# Patient Record
Sex: Female | Born: 1940 | Race: White | Hispanic: No | Smoking: Former smoker
Health system: Southern US, Community
[De-identification: ages and names within clinical notes are randomized; demographics above are authoritative.]

## PROBLEM LIST (undated history)

## (undated) DIAGNOSIS — I10 Essential (primary) hypertension: Secondary | ICD-10-CM

## (undated) DIAGNOSIS — C3490 Malignant neoplasm of unspecified part of unspecified bronchus or lung: Secondary | ICD-10-CM

## (undated) DIAGNOSIS — H2012 Chronic iridocyclitis, left eye: Secondary | ICD-10-CM

## (undated) DIAGNOSIS — K219 Gastro-esophageal reflux disease without esophagitis: Secondary | ICD-10-CM

## (undated) DIAGNOSIS — Z460 Encounter for fitting and adjustment of spectacles and contact lenses: Secondary | ICD-10-CM

## (undated) DIAGNOSIS — F329 Major depressive disorder, single episode, unspecified: Secondary | ICD-10-CM

## (undated) DIAGNOSIS — R112 Nausea with vomiting, unspecified: Secondary | ICD-10-CM

## (undated) DIAGNOSIS — M1990 Unspecified osteoarthritis, unspecified site: Secondary | ICD-10-CM

## (undated) DIAGNOSIS — Z9889 Other specified postprocedural states: Secondary | ICD-10-CM

## (undated) DIAGNOSIS — N952 Postmenopausal atrophic vaginitis: Secondary | ICD-10-CM

## (undated) DIAGNOSIS — N200 Calculus of kidney: Secondary | ICD-10-CM

## (undated) DIAGNOSIS — Z9109 Other allergy status, other than to drugs and biological substances: Secondary | ICD-10-CM

## (undated) DIAGNOSIS — F418 Other specified anxiety disorders: Secondary | ICD-10-CM

## (undated) DIAGNOSIS — F32A Depression, unspecified: Secondary | ICD-10-CM

## (undated) DIAGNOSIS — K635 Polyp of colon: Secondary | ICD-10-CM

## (undated) DIAGNOSIS — M8580 Other specified disorders of bone density and structure, unspecified site: Secondary | ICD-10-CM

## (undated) DIAGNOSIS — N1832 Chronic kidney disease, stage 3b: Secondary | ICD-10-CM

## (undated) DIAGNOSIS — J45909 Unspecified asthma, uncomplicated: Secondary | ICD-10-CM

## (undated) DIAGNOSIS — F419 Anxiety disorder, unspecified: Secondary | ICD-10-CM

## (undated) DIAGNOSIS — E781 Pure hyperglyceridemia: Secondary | ICD-10-CM

## (undated) HISTORY — DX: Pure hyperglyceridemia: E78.1

## (undated) HISTORY — PX: APPENDECTOMY: SHX54

## (undated) HISTORY — DX: Polyp of colon: K63.5

## (undated) HISTORY — DX: Malignant neoplasm of unspecified part of unspecified bronchus or lung: C34.90

## (undated) HISTORY — PX: EYE SURGERY: SHX253

## (undated) HISTORY — DX: Other specified disorders of bone density and structure, unspecified site: M85.80

## (undated) HISTORY — DX: Gastro-esophageal reflux disease without esophagitis: K21.9

## (undated) HISTORY — DX: Postmenopausal atrophic vaginitis: N95.2

## (undated) HISTORY — DX: Other specified anxiety disorders: F41.8

## (undated) HISTORY — DX: Unspecified osteoarthritis, unspecified site: M19.90

## (undated) HISTORY — PX: PARTIAL HYSTERECTOMY: SHX80

## (undated) HISTORY — DX: Calculus of kidney: N20.0

## (undated) HISTORY — PX: COLONOSCOPY: SHX174

---

## 1999-10-30 ENCOUNTER — Encounter: Payer: Self-pay | Admitting: Orthopedic Surgery

## 1999-10-30 ENCOUNTER — Encounter: Admission: RE | Admit: 1999-10-30 | Discharge: 1999-10-30 | Payer: Self-pay | Admitting: Orthopedic Surgery

## 2000-02-09 ENCOUNTER — Encounter: Admission: RE | Admit: 2000-02-09 | Discharge: 2000-02-09 | Payer: Self-pay | Admitting: Family Medicine

## 2000-02-09 ENCOUNTER — Encounter: Payer: Self-pay | Admitting: Family Medicine

## 2000-08-13 ENCOUNTER — Encounter: Admission: RE | Admit: 2000-08-13 | Discharge: 2000-08-13 | Payer: Self-pay | Admitting: Family Medicine

## 2000-08-13 ENCOUNTER — Encounter: Payer: Self-pay | Admitting: Family Medicine

## 2000-11-04 ENCOUNTER — Encounter (INDEPENDENT_AMBULATORY_CARE_PROVIDER_SITE_OTHER): Payer: Self-pay | Admitting: Specialist

## 2000-11-04 ENCOUNTER — Ambulatory Visit (HOSPITAL_COMMUNITY): Admission: RE | Admit: 2000-11-04 | Discharge: 2000-11-04 | Payer: Self-pay | Admitting: Internal Medicine

## 2001-03-30 ENCOUNTER — Encounter: Admission: RE | Admit: 2001-03-30 | Discharge: 2001-03-30 | Payer: Self-pay | Admitting: Family Medicine

## 2001-03-30 ENCOUNTER — Encounter: Payer: Self-pay | Admitting: Family Medicine

## 2002-07-03 ENCOUNTER — Encounter: Admission: RE | Admit: 2002-07-03 | Discharge: 2002-07-03 | Payer: Self-pay | Admitting: Family Medicine

## 2002-07-03 ENCOUNTER — Encounter: Payer: Self-pay | Admitting: Family Medicine

## 2003-07-13 ENCOUNTER — Encounter: Admission: RE | Admit: 2003-07-13 | Discharge: 2003-07-13 | Payer: Self-pay | Admitting: Family Medicine

## 2004-07-31 ENCOUNTER — Encounter: Admission: RE | Admit: 2004-07-31 | Discharge: 2004-07-31 | Payer: Self-pay | Admitting: Family Medicine

## 2005-08-18 ENCOUNTER — Encounter: Admission: RE | Admit: 2005-08-18 | Discharge: 2005-08-18 | Payer: Self-pay | Admitting: Family Medicine

## 2005-09-02 ENCOUNTER — Encounter: Admission: RE | Admit: 2005-09-02 | Discharge: 2005-09-02 | Payer: Self-pay | Admitting: Family Medicine

## 2006-08-31 ENCOUNTER — Encounter: Admission: RE | Admit: 2006-08-31 | Discharge: 2006-08-31 | Payer: Self-pay | Admitting: Family Medicine

## 2006-09-06 ENCOUNTER — Ambulatory Visit: Payer: Self-pay | Admitting: Internal Medicine

## 2006-10-27 ENCOUNTER — Encounter: Payer: Self-pay | Admitting: Internal Medicine

## 2006-10-27 ENCOUNTER — Ambulatory Visit: Payer: Self-pay | Admitting: Internal Medicine

## 2007-11-22 ENCOUNTER — Encounter: Admission: RE | Admit: 2007-11-22 | Discharge: 2007-11-22 | Payer: Self-pay | Admitting: Family Medicine

## 2008-03-21 ENCOUNTER — Encounter: Admission: RE | Admit: 2008-03-21 | Discharge: 2008-03-21 | Payer: Self-pay | Admitting: Family Medicine

## 2008-12-10 ENCOUNTER — Encounter: Admission: RE | Admit: 2008-12-10 | Discharge: 2008-12-10 | Payer: Self-pay | Admitting: Family Medicine

## 2009-12-18 ENCOUNTER — Encounter: Admission: RE | Admit: 2009-12-18 | Discharge: 2009-12-18 | Payer: Self-pay | Admitting: Family Medicine

## 2010-12-30 ENCOUNTER — Other Ambulatory Visit: Payer: Self-pay | Admitting: Family Medicine

## 2010-12-30 DIAGNOSIS — Z1231 Encounter for screening mammogram for malignant neoplasm of breast: Secondary | ICD-10-CM

## 2011-01-14 ENCOUNTER — Ambulatory Visit: Payer: Self-pay

## 2011-01-14 ENCOUNTER — Ambulatory Visit
Admission: RE | Admit: 2011-01-14 | Discharge: 2011-01-14 | Disposition: A | Discharge status: Home / Self Care | Payer: Medicare Other | Source: Ambulatory Visit | Attending: Family Medicine | Admitting: Family Medicine

## 2011-01-14 DIAGNOSIS — Z1231 Encounter for screening mammogram for malignant neoplasm of breast: Secondary | ICD-10-CM

## 2011-05-12 ENCOUNTER — Other Ambulatory Visit: Payer: Self-pay | Admitting: Family Medicine

## 2011-05-12 DIAGNOSIS — M899 Disorder of bone, unspecified: Secondary | ICD-10-CM

## 2011-05-12 DIAGNOSIS — M949 Disorder of cartilage, unspecified: Secondary | ICD-10-CM

## 2011-05-19 ENCOUNTER — Ambulatory Visit
Admission: RE | Admit: 2011-05-19 | Discharge: 2011-05-19 | Disposition: A | Discharge status: Home / Self Care | Payer: Medicare Other | Source: Ambulatory Visit | Attending: Family Medicine | Admitting: Family Medicine

## 2011-05-19 DIAGNOSIS — M949 Disorder of cartilage, unspecified: Secondary | ICD-10-CM

## 2011-05-19 DIAGNOSIS — M899 Disorder of bone, unspecified: Secondary | ICD-10-CM

## 2011-12-01 ENCOUNTER — Other Ambulatory Visit: Payer: Self-pay | Admitting: Physician Assistant

## 2012-01-04 ENCOUNTER — Other Ambulatory Visit: Payer: Self-pay | Admitting: Family Medicine

## 2012-01-04 DIAGNOSIS — Z1231 Encounter for screening mammogram for malignant neoplasm of breast: Secondary | ICD-10-CM

## 2012-01-20 ENCOUNTER — Ambulatory Visit
Admission: RE | Admit: 2012-01-20 | Discharge: 2012-01-20 | Disposition: A | Discharge status: Home / Self Care | Payer: Medicare Other | Source: Ambulatory Visit | Attending: Family Medicine | Admitting: Family Medicine

## 2012-01-20 DIAGNOSIS — Z1231 Encounter for screening mammogram for malignant neoplasm of breast: Secondary | ICD-10-CM

## 2012-12-02 ENCOUNTER — Other Ambulatory Visit: Payer: Self-pay | Admitting: Physician Assistant

## 2012-12-20 ENCOUNTER — Ambulatory Visit: Payer: Medicare Other | Attending: Orthopedic Surgery | Admitting: Physical Therapy

## 2012-12-20 DIAGNOSIS — M25619 Stiffness of unspecified shoulder, not elsewhere classified: Secondary | ICD-10-CM | POA: Insufficient documentation

## 2012-12-20 DIAGNOSIS — M6281 Muscle weakness (generalized): Secondary | ICD-10-CM | POA: Insufficient documentation

## 2012-12-20 DIAGNOSIS — IMO0001 Reserved for inherently not codable concepts without codable children: Secondary | ICD-10-CM | POA: Insufficient documentation

## 2012-12-20 DIAGNOSIS — M25559 Pain in unspecified hip: Secondary | ICD-10-CM | POA: Insufficient documentation

## 2012-12-20 DIAGNOSIS — M25519 Pain in unspecified shoulder: Secondary | ICD-10-CM | POA: Insufficient documentation

## 2012-12-22 ENCOUNTER — Ambulatory Visit: Payer: Medicare Other | Admitting: Physical Therapy

## 2012-12-23 ENCOUNTER — Ambulatory Visit: Payer: Medicare Other | Admitting: Physical Therapy

## 2012-12-27 ENCOUNTER — Ambulatory Visit: Payer: Medicare Other | Admitting: Physical Therapy

## 2012-12-29 ENCOUNTER — Ambulatory Visit: Payer: Medicare Other | Admitting: Physical Therapy

## 2013-01-03 ENCOUNTER — Ambulatory Visit: Payer: Medicare Other | Attending: Orthopedic Surgery | Admitting: Physical Therapy

## 2013-01-03 DIAGNOSIS — IMO0001 Reserved for inherently not codable concepts without codable children: Secondary | ICD-10-CM | POA: Insufficient documentation

## 2013-01-03 DIAGNOSIS — M25559 Pain in unspecified hip: Secondary | ICD-10-CM | POA: Insufficient documentation

## 2013-01-03 DIAGNOSIS — M25619 Stiffness of unspecified shoulder, not elsewhere classified: Secondary | ICD-10-CM | POA: Insufficient documentation

## 2013-01-03 DIAGNOSIS — M25519 Pain in unspecified shoulder: Secondary | ICD-10-CM | POA: Insufficient documentation

## 2013-01-03 DIAGNOSIS — M6281 Muscle weakness (generalized): Secondary | ICD-10-CM | POA: Insufficient documentation

## 2013-01-05 ENCOUNTER — Ambulatory Visit: Payer: Medicare Other | Admitting: Physical Therapy

## 2013-01-09 ENCOUNTER — Ambulatory Visit: Payer: Medicare Other | Admitting: Physical Therapy

## 2013-01-24 ENCOUNTER — Ambulatory Visit: Payer: Medicare Other | Admitting: Physical Therapy

## 2013-01-26 ENCOUNTER — Ambulatory Visit: Payer: Medicare Other | Admitting: Physical Therapy

## 2013-01-30 ENCOUNTER — Ambulatory Visit: Payer: Medicare Other | Admitting: Physical Therapy

## 2013-01-31 ENCOUNTER — Other Ambulatory Visit: Payer: Self-pay

## 2013-01-31 DIAGNOSIS — Z1231 Encounter for screening mammogram for malignant neoplasm of breast: Secondary | ICD-10-CM

## 2013-02-01 ENCOUNTER — Ambulatory Visit: Payer: Medicare Other | Admitting: Physical Therapy

## 2013-02-02 ENCOUNTER — Ambulatory Visit: Payer: Medicare Other | Admitting: Physical Therapy

## 2013-02-08 ENCOUNTER — Other Ambulatory Visit: Payer: Self-pay

## 2013-02-09 ENCOUNTER — Ambulatory Visit: Payer: Medicare Other | Attending: Orthopedic Surgery | Admitting: Physical Therapy

## 2013-02-09 DIAGNOSIS — IMO0001 Reserved for inherently not codable concepts without codable children: Secondary | ICD-10-CM | POA: Insufficient documentation

## 2013-02-09 DIAGNOSIS — M25619 Stiffness of unspecified shoulder, not elsewhere classified: Secondary | ICD-10-CM | POA: Insufficient documentation

## 2013-02-09 DIAGNOSIS — M25519 Pain in unspecified shoulder: Secondary | ICD-10-CM | POA: Insufficient documentation

## 2013-02-09 DIAGNOSIS — M6281 Muscle weakness (generalized): Secondary | ICD-10-CM | POA: Insufficient documentation

## 2013-02-09 DIAGNOSIS — M25559 Pain in unspecified hip: Secondary | ICD-10-CM | POA: Insufficient documentation

## 2013-02-16 ENCOUNTER — Ambulatory Visit: Payer: Medicare Other | Admitting: Physical Therapy

## 2013-02-21 ENCOUNTER — Ambulatory Visit: Payer: Medicare Other | Admitting: Physical Therapy

## 2013-02-22 ENCOUNTER — Ambulatory Visit
Admission: RE | Admit: 2013-02-22 | Discharge: 2013-02-22 | Disposition: A | Discharge status: Home / Self Care | Payer: Medicare Other | Source: Ambulatory Visit

## 2013-02-22 DIAGNOSIS — Z1231 Encounter for screening mammogram for malignant neoplasm of breast: Secondary | ICD-10-CM

## 2013-02-27 ENCOUNTER — Ambulatory Visit: Payer: Medicare Other | Admitting: Physical Therapy

## 2013-03-02 ENCOUNTER — Ambulatory Visit: Payer: Medicare Other | Admitting: Physical Therapy

## 2013-03-07 ENCOUNTER — Ambulatory Visit: Payer: Medicare Other | Admitting: Physical Therapy

## 2013-03-09 ENCOUNTER — Ambulatory Visit: Payer: Medicare Other | Admitting: Physical Therapy

## 2013-05-11 ENCOUNTER — Other Ambulatory Visit: Payer: Self-pay

## 2013-05-30 ENCOUNTER — Ambulatory Visit (INDEPENDENT_AMBULATORY_CARE_PROVIDER_SITE_OTHER): Payer: Medicare Other | Admitting: Internal Medicine

## 2013-05-30 ENCOUNTER — Encounter: Payer: Self-pay | Admitting: Internal Medicine

## 2013-05-30 VITALS — BP 124/70 | HR 70 | Temp 98.0°F | Ht 64.0 in | Wt 199.2 lb

## 2013-05-30 DIAGNOSIS — R059 Cough, unspecified: Secondary | ICD-10-CM

## 2013-05-30 DIAGNOSIS — R05 Cough: Secondary | ICD-10-CM

## 2013-05-30 MED ORDER — FAMOTIDINE 20 MG PO TABS: ORAL_TABLET | ORAL | Status: DC

## 2013-05-30 MED ORDER — PANTOPRAZOLE SODIUM 40 MG PO TBEC: 40.0000 mg | DELAYED_RELEASE_TABLET | Freq: Every day | ORAL | Status: DC

## 2013-05-30 MED ORDER — TRAMADOL HCL 50 MG PO TABS: ORAL_TABLET | ORAL | Status: DC

## 2013-05-30 MED ORDER — PREDNISONE (PAK) 10 MG PO TABS: ORAL_TABLET | ORAL | Status: DC

## 2013-05-30 NOTE — Progress Notes (Signed)
Subjective:    Patient ID: [name], female    DOB: 1941-02-05  MRN: [id number]  HPI  72 yowf quit smoking 1980 allergies itching and sneezing took shots much better and stopped shots and much better except some problem spring and fall and did ok until 40's then developed recurrent cough typically assoc with URI's and not with spring and fall  But much worse since  Early 2014 despite rx for  Asthma and gerd so referred 05/30/2013 to pulmonary clinic by Dr Wyline Beady   05/30/2013 1st [facility] office visit/ Tanor Glaspy cc daily cough x months which is so severe gets gagged and occ vomit and pos urinary incontinent.  Clear mucus esp p shower, no better with inhalers and acid suppression to date     Kouffman Reflux v Neurogenic Cough Differentiator Reflux Comments  Do you awaken from a sound sleep coughing violently?                            With trouble breathing? Yes   Do you have choking episodes when you cannot  Get enough air, gasping for air ?              Yes   Do you usually cough when you lie down into  The bed, or when you just lie down to rest ?                          Yes   Do you usually cough after meals or eating?         sometime   Do you cough when (or after) you bend over?    Yes   GERD SCORE     Kouffman Reflux v Neurogenic Cough Differentiator Neurogenic   Do you more-or-less cough all day long? yes   Does change of temperature make you cough? yes   Does laughing or chuckling cause you to cough? yes   Do fumes (perfume, automobile fumes, burned  Toast, etc.,) cause you to cough ?      no   Does speaking, singing, or talking on the phone cause you to cough   ?               yes   Neurogenic/Airway score      No obvious day to day or daytime variabilty or assoc sob (unless coughing) or cp or chest tightness, subjective wheeze overt sinus  symptoms. No unusual exp hx or h/o childhood pna/ asthma or knowledge of premature birth.    Also denies any obvious  fluctuation of symptoms with weather or environmental changes or other aggravating or alleviating factors except as outlined above   Current Medications, Allergies, Complete Past Medical History, Past Surgical History, Family History, and Social History were reviewed in [employer] record.         Review of Systems  Constitutional: Negative for fever, chills and unexpected weight change.  HENT: Negative for congestion, dental problem, ear pain, nosebleeds, postnasal drip, rhinorrhea, sinus pressure, sneezing, sore throat, trouble swallowing and voice change.   Eyes: Negative for visual disturbance.  Respiratory: Positive for cough and shortness of breath. Negative for choking.   Cardiovascular: Positive for chest pain. Negative for leg swelling.  Gastrointestinal: Negative for vomiting, abdominal pain and diarrhea.  Genitourinary: Negative for difficulty urinating.       Acid Heartburn  Musculoskeletal: Positive for arthralgias.  Skin: Negative for rash.  Neurological: Positive for headaches. Negative for tremors and syncope.  Hematological: Does not bruise/bleed easily.       Objective:   Physical Exam  amb wf nad   Wt Readings from Last 3 Encounters:  05/30/13 199 lb 3.2 oz (90.357 kg)     HEENT: nl dentition, turbinates, and orophanx. Nl external ear canals without cough reflex   NECK :  without JVD/Nodes/TM/ nl carotid upstrokes bilaterally   LUNGS: no acc muscle use, clear to A and P bilaterally without cough on insp or exp maneuvers   CV:  RRR  no s3 or murmur or increase in P2, no edema   ABD:  soft and nontender with nl excursion in the supine position. No bruits or organomegaly, bowel sounds nl  MS:  warm without deformities, calf tenderness, cyanosis or clubbing  SKIN: warm and dry without lesions    NEURO:  alert, approp, no deficits    cxr 02/23/13 R midlung atx or infiltrate also ? Lingular atx        Assessment & Plan:

## 2013-05-30 NOTE — Patient Instructions (Signed)
The key to effective treatment for your cough is eliminating the non-stop cycle of cough you're stuck in long enough to let your airway heal completely and then see if there is anything still making you cough once you stop the cough suppression, but this should take no more than 5 days to figure out  First take delsym two tsp every 12 hours and supplement if needed with  tramadol 50 mg up to 2 every 4 hours to suppress the urge to cough at all or even clear your throat. Swallowing water or using ice chips/non mint and menthol containing candies (such as lifesavers or sugarless jolly ranchers) are also effective.  You should rest your voice and avoid activities that you know make you cough.  Once you have eliminated the cough for 3 straight days try reducing the tramadol first,  then the delsym as tolerated.    Try prilosec 20mg   Take x 2  30-60 min before first meal of the day and Pepcid 20 mg one bedtime   Prednisone 10 mg take  4 each am x 2 days,   2 each am x 2 days,  1 each am x 2 days and stop    GERD (REFLUX)  is an extremely common cause of respiratory symptoms, many times with no significant heartburn at all.    It can be treated with medication, but also with lifestyle changes including avoidance of late meals, excessive alcohol, smoking cessation, and avoid fatty foods, chocolate, peppermint, colas, red wine, and acidic juices such as orange juice.  NO MINT OR MENTHOL PRODUCTS SO NO COUGH DROPS  USE SUGARLESS CANDY INSTEAD (jolley ranchers or Stover's)  NO OIL BASED VITAMINS - use powdered substitutes.   Please schedule a follow up office visit in 4 weeks, sooner if needed

## 2013-05-31 DIAGNOSIS — J45991 Cough variant asthma: Secondary | ICD-10-CM

## 2013-05-31 HISTORY — DX: Cough variant asthma: J45.991

## 2013-05-31 NOTE — Assessment & Plan Note (Addendum)
The most common causes of chronic cough in immunocompetent adults include the following: upper airway cough syndrome (UACS), previously referred to as postnasal drip syndrome (PNDS), which is caused by variety of rhinosinus conditions; (2) asthma; (3) GERD; (4) chronic bronchitis from cigarette smoking or other inhaled environmental irritants; (5) nonasthmatic eosinophilic bronchitis; and (6) bronchiectasis.   These conditions, singly or in combination, have accounted for up to 94% of the causes of chronic cough in prospective studies.   Other conditions have constituted no >6% of the causes in prospective studies These have included bronchogenic carcinoma, chronic interstitial pneumonia, sarcoidosis, left ventricular failure, ACEI-induced cough, and aspiration from a condition associated with pharyngeal dysfunction.    Chronic cough is often simultaneously caused by more than one condition. A single cause has been found from 38 to 82% of the time, multiple causes from 18 to 62%. Multiply caused cough has been the result of three diseases up to 42% of the time.       Most likely this is  Classic Upper airway cough syndrome, so named because it's frequently impossible to sort out how much is  CR/sinusitis with freq throat clearing (which can be related to primary GERD)   vs  causing  secondary (" extra esophageal")  GERD from wide swings in gastric pressure that occur with throat clearing, often  promoting self use of mint and menthol lozenges that reduce the lower esophageal sphincter tone and exacerbate the problem further in a cyclical fashion.   These are the same pts (now being labeled as having "irritable larynx syndrome" by some cough centers) who not infrequently have a history of having failed to tolerate ace inhibitors,  dry powder inhalers or biphosphonates or report having atypical reflux symptoms that don't respond to standard doses of PPI , and are easily confused as having aecopd or asthma  flares by even experienced allergists/ pulmonologists.   Will try on cyclical cough protocol and regroup in 2 weeks

## 2013-06-12 ENCOUNTER — Telehealth: Payer: Self-pay | Admitting: Internal Medicine

## 2013-06-12 NOTE — Telephone Encounter (Signed)
Should be ok but take with bfast

## 2013-06-12 NOTE — Telephone Encounter (Signed)
lmomtcb x1 for pt 

## 2013-06-12 NOTE — Telephone Encounter (Signed)
i called and spoke with pt. She is aware of recs. Nothing further needed

## 2013-06-12 NOTE — Telephone Encounter (Signed)
Returning call can be reached at [phone].[name]

## 2013-06-12 NOTE — Telephone Encounter (Signed)
Pt is returning triages call & can be reached at [phone].  [name]

## 2013-06-12 NOTE — Telephone Encounter (Signed)
Called, spoke with pt.  Reports she was seen by PCP, Dr. Catha Gosselin, on Friday for CPX.  Per pt, her vit d was very low, and Dr. Clarene Duke started her on Vit d 50000 once weekly x 12 wks.  Pt would like MW to be aware of this given it is a capsule, and MW stopped her oil based vitamins.  Advised I would send msg to MW as Lorain Childes.  She verbalized understanding and voiced no further questions or concerns at this time.

## 2013-06-27 ENCOUNTER — Ambulatory Visit (INDEPENDENT_AMBULATORY_CARE_PROVIDER_SITE_OTHER): Payer: Medicare Other | Admitting: Internal Medicine

## 2013-06-27 ENCOUNTER — Ambulatory Visit (INDEPENDENT_AMBULATORY_CARE_PROVIDER_SITE_OTHER)
Admission: RE | Admit: 2013-06-27 | Discharge: 2013-06-27 | Disposition: A | Discharge status: Home / Self Care | Payer: Medicare Other | Source: Ambulatory Visit | Attending: Internal Medicine | Admitting: Internal Medicine

## 2013-06-27 ENCOUNTER — Encounter: Payer: Self-pay | Admitting: Internal Medicine

## 2013-06-27 VITALS — BP 138/82 | HR 65 | Temp 98.0°F | Ht 64.0 in | Wt 201.0 lb

## 2013-06-27 DIAGNOSIS — R9389 Abnormal findings on diagnostic imaging of other specified body structures: Secondary | ICD-10-CM

## 2013-06-27 DIAGNOSIS — R059 Cough, unspecified: Secondary | ICD-10-CM

## 2013-06-27 DIAGNOSIS — R05 Cough: Secondary | ICD-10-CM

## 2013-06-27 DIAGNOSIS — R918 Other nonspecific abnormal finding of lung field: Secondary | ICD-10-CM

## 2013-06-27 MED ORDER — METHYLPREDNISOLONE ACETATE 80 MG/ML IJ SUSP
120.0000 mg | Freq: Once | INTRAMUSCULAR | Status: AC
  Administered 2013-06-27: 120 mg via INTRAMUSCULAR

## 2013-06-27 MED ORDER — ACETAMINOPHEN-CODEINE #3 300-30 MG PO TABS: ORAL_TABLET | ORAL | Status: DC

## 2013-06-27 MED ORDER — FLUTTER DEVI: Status: DC

## 2013-06-27 NOTE — Progress Notes (Signed)
Subjective:    Patient ID: [name], female    DOB: Nov 06, 1940  MRN: [id number]   Brief patient profile:  72 yowf quit smoking 1980 allergies itching and sneezing took shots much better and stopped shots and much better except some problem spring and fall and did ok until 40's then developed recurrent cough typically assoc with URI's and not with spring and fall  But much worse since  Early 2014 despite rx for  Asthma and gerd so referred 05/30/2013 to pulmonary clinic by Dr Wyline Beady   05/30/2013 1st [facility] office visit/ Wert cc daily cough x months which is so severe gets gagged and occ vomit and pos urinary incontinent.  Clear mucus esp p shower, no better with inhalers and acid suppression to date  rec  First take delsym two tsp every 12 hours and supplement if needed with  tramadol 50 mg up to 2 every 4 hours to suppress the urge to cough at all or even clear your throat. Swallowing water or using ice chips/non mint and menthol containing candies (such as lifesavers or sugarless jolly ranchers) are also effective.  You should rest your voice and avoid activities that you know make you cough. Once you have eliminated the cough for 3 straight days try reducing the tramadol first,  then the delsym as tolerated.   Try prilosec 20mg   Take x 2  30-60 min before first meal of the day and Pepcid 20 mg one bedtime  Prednisone 10 mg take  4 each am x 2 days,   2 each am x 2 days,  1 each am x 2 days and stop   GERD diet    06/27/2013 f/u ov/Wert re: 0 % better chronic cough x Jan 2014 / using saba for cough Chief Complaint  Patient presents with  . Follow-up    Pt states her SOB and wheezing are unchanged. Her cough is slightly improved since last visit. No new co's.         Kouffman Reflux v Neurogenic Cough Differentiator Reflux Comments  Do you awaken from a sound sleep coughing violently?                            With trouble breathing? Yes   Do you have choking  episodes when you cannot  Get enough air, gasping for air ?              Yes   Do you usually cough when you lie down into  The bed, or when you just lie down to rest ?                          Recliner x 24m   Do you usually cough after meals or eating?         sometime   Do you cough when (or after) you bend over?    Yes   GERD SCORE     Kouffman Reflux v Neurogenic Cough Differentiator Neurogenic   Do you more-or-less cough all day long? yes   Does change of temperature make you cough? yes   Does laughing or chuckling cause you to cough? yes   Do fumes (perfume, automobile fumes, burned  Toast, etc.,) cause you to cough ?      no   Does speaking, singing, or talking on the phone cause you to cough   ?  yes   Neurogenic/Airway score                Objective:   Physical Exam  amb wf nad with harsh barking cough  . Wt Readings from Last 3 Encounters:  06/27/13 201 lb (91.173 kg)  05/30/13 199 lb 3.2 oz (90.357 kg)        HEENT: nl dentition, turbinates, and orophanx. Nl external ear canals without cough reflex   NECK :  without JVD/Nodes/TM/ nl carotid upstrokes bilaterally   LUNGS: no acc muscle use, clear to A and P bilaterally without cough on insp or exp maneuvers   CV:  RRR  no s3 or murmur or increase in P2, no edema   ABD:  soft and nontender with nl excursion in the supine position. No bruits or organomegaly, bowel sounds nl  MS:  warm without deformities, calf tenderness, cyanosis or clubbing  SKIN: warm and dry without lesions    NEURO:  alert, approp, no deficits    cxr 02/23/13 R midlung atx or infiltrate also ? Lingular atx  CXR  06/27/2013 : Poorly defined density in the region of the right middle lobe further evaluation with contrasted chest CT recommended. Atelectasis versus infiltrate in region of the lingula.         Assessment & Plan:

## 2013-06-27 NOTE — Patient Instructions (Addendum)
Please see patient coordinator before you leave today  to schedule sinus ct  Only use your albuterol/proaire as a rescue medication to be used if you can't catch your breath by resting or doing a relaxed purse lip breathing pattern.  - The less you use it, the better it will work when you need it. - Ok to use up to every 4 hours for breathing, not coughing - Don't leave home without it !!  (think of it like your spare tire for your car)   The key to effective treatment for your cough is eliminating the non-stop cycle of cough you're stuck in long enough to let your airway heal completely and then see if there is anything still making you cough once you stop the cough suppression, but this should take no more than 5 days to figure out  First take delsym two tsp every 12 hours and supplement if needed with  Tylenol #3 one every 4 hours to suppress the urge to cough at all or even clear your throat. Swallowing water or using ice chips/non mint and menthol containing candies (such as lifesavers or sugarless jolly ranchers) are also effective.  You should rest your voice and avoid activities that you know make you cough.  Once you have eliminated the cough for 3 straight days try reducing the tylenol #3 ,  then the delsym as tolerated.   Keep flutter valve handy and use it when you start cough   Omeprazole 40 mg  Take x 2  30-60 min before first meal of the day and Pepcid 20 mg one bedtime and chlotrimeton 4 mg one at bedtime  GERD (REFLUX)  is an extremely common cause of respiratory symptoms, many times with no significant heartburn at all.    It can be treated with medication, but also with lifestyle changes including avoidance of late meals, excessive alcohol, smoking cessation, and avoid fatty foods, chocolate, peppermint, colas (includes pepsi) , red wine, and acidic juices such as orange juice and limit caffeine. NO MINT OR MENTHOL PRODUCTS SO NO COUGH DROPS  USE SUGARLESS CANDY INSTEAD  (jolley ranchers or Stover's)  NO OIL BASED VITAMINS - use powdered substitutes.  Please remember to go to the x-ray department downstairs for your tests - we will call you with the results when they are available.      Please schedule a follow up office visit in 4 weeks, sooner if needed bring all medications with you when return.

## 2013-06-29 DIAGNOSIS — R918 Other nonspecific abnormal finding of lung field: Secondary | ICD-10-CM

## 2013-06-29 HISTORY — DX: Other nonspecific abnormal finding of lung field: R91.8

## 2013-06-29 NOTE — Assessment & Plan Note (Addendum)
-   Spirometry 02/23/13 wnl   Lack of cough resolution on a verified empirical regimen could mean an alternative diagnosis, persistence of the disease state (eg sinusitis or bronchiectasis) , or inadequacy of currently available therapy (eg no medical rx available for non-acid gerd)   rec eliminate cyclical coughing adding flutter valve to rx, pursue sinus CT and consider CT chest looking for bronchiectasis     Each maintenance medication was reviewed in detail including most importantly the difference between maintenance and as needed and under what circumstances the prns are to be used.  Please see instructions for details which were reviewed in writing and the patient given a copy.

## 2013-06-29 NOTE — Assessment & Plan Note (Signed)
Unlikely to be related to cough but could have underlying bronchiectasis > add CT chest at same time does CT sinus

## 2013-07-03 ENCOUNTER — Ambulatory Visit (INDEPENDENT_AMBULATORY_CARE_PROVIDER_SITE_OTHER)
Admission: RE | Admit: 2013-07-03 | Discharge: 2013-07-03 | Disposition: A | Discharge status: Home / Self Care | Payer: Medicare Other | Source: Ambulatory Visit | Attending: Internal Medicine | Admitting: Internal Medicine

## 2013-07-03 ENCOUNTER — Encounter: Payer: Self-pay | Admitting: Internal Medicine

## 2013-07-03 DIAGNOSIS — R059 Cough, unspecified: Secondary | ICD-10-CM

## 2013-07-03 DIAGNOSIS — R05 Cough: Secondary | ICD-10-CM

## 2013-07-11 ENCOUNTER — Telehealth: Payer: Self-pay | Admitting: *Deleted

## 2013-07-11 DIAGNOSIS — R05 Cough: Secondary | ICD-10-CM

## 2013-07-11 DIAGNOSIS — R059 Cough, unspecified: Secondary | ICD-10-CM

## 2013-07-11 NOTE — Telephone Encounter (Signed)
Message copied by Rosana Berger on Tue Jul 11, 2013  5:20 PM ------      Message from: Tanda Rockers      Created: Tue Jul 11, 2013  6:52 AM                   ----- Message -----         From: Tanda Rockers, MD         Sent: 07/03/2013   3:07 PM           To: Tanda Rockers, MD            Let her know sinus ct ok so next need to do CT chest no contrast eval cough  ------

## 2013-07-11 NOTE — Telephone Encounter (Signed)
Spoke with pt and notified of results per Dr. Melvyn Novas. Pt verbalized understanding and denied any questions. Order was sent to [facility]  Dr Melvyn Novas, pt has questions about her cxr  She reviewed her results on MyChart and is concered  Please advise thanks

## 2013-07-12 NOTE — Telephone Encounter (Signed)
Reviewed with pt, no change in recs

## 2013-07-13 ENCOUNTER — Other Ambulatory Visit (INDEPENDENT_AMBULATORY_CARE_PROVIDER_SITE_OTHER): Payer: Medicare Other

## 2013-07-13 DIAGNOSIS — R05 Cough: Secondary | ICD-10-CM

## 2013-07-13 DIAGNOSIS — R059 Cough, unspecified: Secondary | ICD-10-CM

## 2013-07-13 LAB — BASIC METABOLIC PANEL
BUN: 23 mg/dL (ref 6–23)
CO2: 28 mEq/L (ref 19–32)
Calcium: 10.2 mg/dL (ref 8.4–10.5)
Chloride: 104 mEq/L (ref 96–112)
Creatinine, Ser: 1.2 mg/dL (ref 0.4–1.2)
GFR: 49.16 mL/min — ABNORMAL LOW (ref >60.00)
Glucose, Bld: 95 mg/dL (ref 70–99)
Potassium: 4.5 mEq/L (ref 3.5–5.1)
Sodium: 139 mEq/L (ref 135–145)

## 2013-07-14 ENCOUNTER — Ambulatory Visit (INDEPENDENT_AMBULATORY_CARE_PROVIDER_SITE_OTHER)
Admission: RE | Admit: 2013-07-14 | Discharge: 2013-07-14 | Disposition: A | Discharge status: Home / Self Care | Payer: Medicare Other | Source: Ambulatory Visit | Attending: Internal Medicine | Admitting: Internal Medicine

## 2013-07-14 ENCOUNTER — Encounter: Payer: Self-pay | Admitting: Internal Medicine

## 2013-07-14 DIAGNOSIS — R059 Cough, unspecified: Secondary | ICD-10-CM

## 2013-07-14 DIAGNOSIS — R05 Cough: Secondary | ICD-10-CM

## 2013-07-14 MED ORDER — IOHEXOL 300 MG/ML  SOLN
80.0000 mL | Freq: Once | INTRAMUSCULAR | Status: AC | PRN
  Administered 2013-07-14: 80 mL via INTRAVENOUS

## 2013-07-25 ENCOUNTER — Ambulatory Visit (INDEPENDENT_AMBULATORY_CARE_PROVIDER_SITE_OTHER): Payer: Medicare Other | Admitting: Internal Medicine

## 2013-07-25 ENCOUNTER — Encounter: Payer: Self-pay | Admitting: Internal Medicine

## 2013-07-25 VITALS — BP 120/84 | HR 73 | Temp 97.6°F | Ht 64.0 in | Wt 197.0 lb

## 2013-07-25 DIAGNOSIS — R05 Cough: Secondary | ICD-10-CM

## 2013-07-25 DIAGNOSIS — R9389 Abnormal findings on diagnostic imaging of other specified body structures: Secondary | ICD-10-CM

## 2013-07-25 DIAGNOSIS — R059 Cough, unspecified: Secondary | ICD-10-CM

## 2013-07-25 DIAGNOSIS — R918 Other nonspecific abnormal finding of lung field: Secondary | ICD-10-CM

## 2013-07-25 MED ORDER — PREDNISONE 10 MG PO TABS: ORAL_TABLET | ORAL | Status: DC

## 2013-07-25 MED ORDER — METHYLPREDNISOLONE ACETATE 80 MG/ML IJ SUSP
120.0000 mg | Freq: Once | INTRAMUSCULAR | Status: AC
  Administered 2013-07-25: 120 mg via INTRAMUSCULAR

## 2013-07-25 MED ORDER — TRAMADOL HCL 50 MG PO TABS: ORAL_TABLET | ORAL | Status: DC

## 2013-07-25 NOTE — Progress Notes (Signed)
Subjective:    Patient ID: [name], female    DOB: 1940-12-30  MRN: [id number]   Brief patient profile:  48 yowf quit smoking 1980 allergies itching and sneezing took shots much improved when stopped shots and stayed  much better except some problem spring and fall and did ok until 40's then developed recurrent cough typically assoc with URI's and not with spring and fall  But much worse since  Early 2014 despite rx for  Asthma and gerd so referred 05/30/2013 to pulmonary clinic by Dr Lawernce Pitts  History of Present Illness  05/30/2013 1st [facility] office visit/ Wert cc daily cough x months which is so severe gets gagged and occ vomit and pos urinary incontinent.  Clear mucus esp p shower, no better with inhalers and acid suppression to date  rec  First take delsym two tsp every 12 hours and supplement if needed with  tramadol 50 mg up to 2 every 4 hours to suppress the urge to cough at all or even clear your throat. Swallowing water or using ice chips/non mint and menthol containing candies (such as lifesavers or sugarless jolly ranchers) are also effective.  You should rest your voice and avoid activities that you know make you cough. Once you have eliminated the cough for 3 straight days try reducing the tramadol first,  then the delsym as tolerated.   Try prilosec 20mg   Take x 2  30-60 min before first meal of the day and Pepcid 20 mg one bedtime  Prednisone 10 mg take  4 each am x 2 days,   2 each am x 2 days,  1 each am x 2 days and stop   GERD diet    06/27/2013 f/u ov/Wert re: 0 % better chronic cough x Jan 2014 / using saba for cough Chief Complaint  Patient presents with  . Follow-up    Pt states her SOB and wheezing are unchanged. Her cough is slightly improved since last visit. No new co's.    rec Please see patient coordinator before you leave today  to schedule sinus ct  Only use your albuterol/proaire as a rescue medication  First take delsym two tsp every 12  hours and supplement if needed with  Tylenol #3 one every 4 hours to suppress the urge to cough at all or even clear your throat.   Once you have eliminated the cough for 3 straight days try reducing the tylenol #3 ,  then the delsym as tolerated.  Keep flutter valve handy and use it when you start cough  Omeprazole 40 mg  Take x 2  30-60 min before first meal of the day and Pepcid 20 mg one bedtime and chlotrimeton 4 mg one at bedtime GERD diet   Please schedule a follow up office visit in 4 weeks, sooner if needed bring all medications with you when return.    07/25/2013 f/u ov/Wert re: cough x > one year / did not bring all meds "they are the same" Chief Complaint  Patient presents with  . Follow-up    Pt states her SOB and wheezing have improved some but her cough is some worse. Cough is prod with very minimal clear sputum.         Kouffman Reflux v Neurogenic Cough Differentiator Reflux Comments  Do you awaken from a sound sleep coughing violently?  With trouble breathing? Yes   Do you have choking episodes when you cannot  Get enough air, gasping for air ?              Yes   Do you usually cough when you lie down into  The bed, or when you just lie down to rest ?                          Recliner x 76m   Do you usually cough after meals or eating?         sometime   Do you cough when (or after) you bend over?    Yes   GERD SCORE     Kouffman Reflux v Neurogenic Cough Differentiator Neurogenic   Do you more-or-less cough all day long? yes   Does change of temperature make you cough? yes   Does laughing or chuckling cause you to cough? yes   Do fumes (perfume, automobile fumes, burned  Toast, etc.,) cause you to cough ?      no   Does speaking, singing, or talking on the phone cause you to cough   ?               yes   Neurogenic/Airway score          No obvious other patterns in  day to day or daytime variabilty or assoc chronic cough or cp or chest  tightness, subjective wheeze overt sinus or hb symptoms. No unusual exp hx or h/o childhood pna/ asthma or knowledge of premature birth.  Sleeping ok without nocturnal  or early am exacerbation  of respiratory  c/o's or need for noct saba. Also denies any obvious fluctuation of symptoms with weather or environmental changes or other aggravating or alleviating factors except as outlined above   Current Medications, Allergies, Complete Past Medical History, Past Surgical History, Family History, and Social History were reviewed in [employer] record.  ROS  The following are not active complaints unless bolded sore throat, dysphagia, dental problems, itching, sneezing,  nasal congestion or excess/ purulent secretions, ear ache,   fever, chills, sweats, unintended wt loss, pleuritic or exertional cp, hemoptysis,  orthopnea pnd or leg swelling, presyncope, palpitations, heartburn, abdominal pain, anorexia, nausea, vomiting, diarrhea  or change in bowel or urinary habits, change in stools or urine, dysuria,hematuria,  rash, arthralgias, visual complaints, headache, numbness weakness or ataxia or problems with walking or coordination,  change in mood/affect or memory.           Objective:   Physical Exam  amb wf nad with harsh barking cough  07/25/2013       197  Wt Readings from Last 3 Encounters:  06/27/13 201 lb (91.173 kg)  05/30/13 199 lb 3.2 oz (90.357 kg)        HEENT: nl dentition, turbinates, and orophanx. Nl external ear canals without cough reflex   NECK :  without JVD/Nodes/TM/ nl carotid upstrokes bilaterally   LUNGS: no acc muscle use, clear to A and P bilaterally without cough on insp or exp maneuvers   CV:  RRR  no s3 or murmur or increase in P2, no edema   ABD:  soft and nontender with nl excursion in the supine position. No bruits or organomegaly, bowel sounds nl  MS:  warm without deformities, calf tenderness, cyanosis or clubbing  SKIN: warm  and dry without lesions    NEURO:  alert, approp, no deficits    cxr 02/23/13 R midlung atx or infiltrate also ? Lingular atx  CXR  06/27/2013 : Poorly defined density in the region of the right middle lobe further evaluation with contrasted chest CT recommended. Atelectasis versus infiltrate in region of the lingula.  CT chest c contrast 07/11/13 1. No acute findings in the thorax to account for the patient's  symptoms.  2. However, there is a subsolid nodule in the anterior aspect of the  right lower lobe that has a ground-glass attenuation component  measuring 2.5 x 1.8 cm, and a small central solid component  measuring 4 mm. Initial follow-up by chest CT without contrast is  recommended in 3 months to confirm persistence.          Assessment & Plan:

## 2013-07-25 NOTE — Patient Instructions (Addendum)
Prednisone 10 mg take  4 each am x 2 days,   2 each am x 2 days,  1 each am x 2 days and stop  (called in)  The key to effective treatment for your cough is eliminating the non-stop cycle of cough you're stuck in long enough to let your airway heal completely and then see if there is anything still making you cough once you stop the cough suppression, but this should take no more than 5 days to figure out  First take delsym two tsp every 12 hours and supplement if needed with  Tylenol #3 one every 4 hours (or two tramadol) to suppress the urge to cough at all or even clear your throat. Swallowing water or using ice chips/non mint and menthol containing candies (such as lifesavers or sugarless jolly ranchers) are also effective.  You should rest your voice and avoid activities that you know make you cough.  Once you have eliminated the cough for 3 straight days try reducing the tylenol #3/tramadol first ,  then the delsym as tolerated.   Keep flutter valve handy and use it when you start cough   Omeprazole 40 mg  Take   30-60 min before first meal of the day and Pepcid 20 mg one bedtime and chlotrimeton 4 mg two  at bedtime  GERD (REFLUX)  is an extremely common cause of respiratory symptoms, many times with no significant heartburn at all.    It can be treated with medication, but also with lifestyle changes including avoidance of late meals, excessive alcohol, smoking cessation, and avoid fatty foods, chocolate, peppermint, colas (includes pepsi) , red wine, and acidic juices such as orange juice and limit caffeine. NO MINT OR MENTHOL PRODUCTS SO NO COUGH DROPS  USE SUGARLESS CANDY INSTEAD (jolley ranchers or Stover's)  NO OIL BASED VITAMINS - use powdered substitutes.  See Tammy NP w/in 2 weeks with all your medications, even over the counter meds, separated in two separate bags, the ones you take no matter what vs the ones you stop once you feel better and take only as needed when you feel you  need them.   Tammy  will generate for you a new user friendly medication calendar that will put [place] all on the same page re: your medication use.     Without this process, it simply isn't possible to assure that we are providing  your outpatient care  with  the attention to detail we feel you deserve.   If we cannot assure that you're getting that kind of care,  then we cannot manage your problem effectively from this clinic.  Once you have seen Tammy and we are sure that we're all on the same page with your medication use she will arrange follow up with me.  Late add:  When returns to see me I need the actual cxr's back 2 years if possible (not just the reports) Consider 2 week course of reglan add on to gerd rx then ov with me to check response

## 2013-07-27 NOTE — Assessment & Plan Note (Signed)
-   Spirometry 02/23/13 wnl  - 07/03/2013 Sinus CT > Mild chronic sinus disease as described. No acute findings. Left-to-right nasal septal deviation of 3 mm.  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   These are the same pts (now being labeled as having "irritable larynx syndrome" by some cough centers) who not infrequently have a history of having failed to tolerate ace inhibitors,  dry powder inhalers or biphosphonates or report having atypical reflux symptoms that don't respond to standard doses of PPI , and are easily confused as having aecopd or asthma flares by even experienced allergists/ pulmonologists.  For now focus again on eliminating cyclical cough and use a trust but verify approach re meds before adding more  See instructions for specific recommendations which were reviewed directly with the patient who was given a copy with highlighter outlining the key components.

## 2013-07-27 NOTE — Assessment & Plan Note (Signed)
-   see CXR 06/27/13  - CT chest 07/14/2013 > 1. No acute findings in the thorax to account for the patient's  symptoms.  2. However, there is a subsolid nodule in the anterior aspect of the  right lower lobe that has a ground-glass attenuation component  measuring 2.5 x 1.8 cm, and a small central solid component  measuring 4 mm. Initial follow-up by chest CT without contrast is  recommended in 3 months to confirm persistence. - in tickle file for recall 10/09/13    Doubt related to upper airway cough and may be viz on plain cxr's > need 2 year comparison and consider PET if definitely new lesion on plain cxr as she is a remote smoker and at least moderate risk

## 2013-08-01 ENCOUNTER — Encounter (HOSPITAL_BASED_OUTPATIENT_CLINIC_OR_DEPARTMENT_OTHER): Payer: Self-pay | Admitting: *Deleted

## 2013-08-01 NOTE — Progress Notes (Signed)
Pt will have neighbor bring-husband does not drive- To bring all meds and overnight bag-had im prednisone recently-sees dr wert.

## 2013-08-02 ENCOUNTER — Encounter (HOSPITAL_BASED_OUTPATIENT_CLINIC_OR_DEPARTMENT_OTHER): Payer: Self-pay | Admitting: Anesthesiology

## 2013-08-02 ENCOUNTER — Ambulatory Visit (HOSPITAL_BASED_OUTPATIENT_CLINIC_OR_DEPARTMENT_OTHER)
Admission: RE | Admit: 2013-08-02 | Discharge: 2013-08-02 | Disposition: A | Discharge status: Home / Self Care | Payer: Medicare Other | Source: Ambulatory Visit | Attending: Orthopedic Surgery | Admitting: Orthopedic Surgery

## 2013-08-02 ENCOUNTER — Encounter (HOSPITAL_BASED_OUTPATIENT_CLINIC_OR_DEPARTMENT_OTHER): Payer: Self-pay | Admitting: *Deleted

## 2013-08-02 ENCOUNTER — Encounter (HOSPITAL_BASED_OUTPATIENT_CLINIC_OR_DEPARTMENT_OTHER)
Admission: RE | Disposition: A | Discharge status: Home / Self Care | Payer: Self-pay | Source: Ambulatory Visit | Attending: Orthopedic Surgery

## 2013-08-02 DIAGNOSIS — Z01812 Encounter for preprocedural laboratory examination: Secondary | ICD-10-CM | POA: Insufficient documentation

## 2013-08-02 DIAGNOSIS — Z538 Procedure and treatment not carried out for other reasons: Secondary | ICD-10-CM | POA: Insufficient documentation

## 2013-08-02 HISTORY — DX: Unspecified asthma, uncomplicated: J45.909

## 2013-08-02 HISTORY — DX: Encounter for fitting and adjustment of spectacles and contact lenses: Z46.0

## 2013-08-02 LAB — POCT HEMOGLOBIN-HEMACUE: Hemoglobin: 12.5 g/dL (ref 12.0–15.0)

## 2013-08-02 SURGERY — CANCELLED PROCEDURE
Site: Wrist | Laterality: Left

## 2013-08-02 MED ORDER — LACTATED RINGERS IV SOLN
INTRAVENOUS | Status: DC
  Administered 2013-08-02: 13:00:00 via INTRAVENOUS

## 2013-08-02 MED ORDER — MIDAZOLAM HCL 2 MG/2ML IJ SOLN
INTRAMUSCULAR | Status: AC
  Filled 2013-08-02: qty 2

## 2013-08-02 MED ORDER — CEFAZOLIN SODIUM-DEXTROSE 2-3 GM-% IV SOLR
INTRAVENOUS | Status: AC
  Filled 2013-08-02: qty 50

## 2013-08-02 MED ORDER — CHLORHEXIDINE GLUCONATE 4 % EX LIQD: 60.0000 mL | Freq: Once | CUTANEOUS | Status: DC

## 2013-08-02 MED ORDER — MIDAZOLAM HCL 2 MG/2ML IJ SOLN: 1.0000 mg | INTRAMUSCULAR | Status: DC | PRN

## 2013-08-02 MED ORDER — FENTANYL CITRATE 0.05 MG/ML IJ SOLN
INTRAMUSCULAR | Status: AC
  Filled 2013-08-02: qty 2

## 2013-08-02 MED ORDER — FENTANYL CITRATE 0.05 MG/ML IJ SOLN: 50.0000 ug | INTRAMUSCULAR | Status: DC | PRN

## 2013-08-02 MED ORDER — CEFAZOLIN SODIUM-DEXTROSE 2-3 GM-% IV SOLR: 2.0000 g | INTRAVENOUS | Status: DC

## 2013-08-02 SURGICAL SUPPLY — 59 items
APL SKNCLS STERI-STRIP NONHPOA (GAUZE/BANDAGES/DRESSINGS)
BANDAGE ELASTIC 4 VELCRO ST LF (GAUZE/BANDAGES/DRESSINGS) IMPLANT
BENZOIN TINCTURE PRP APPL 2/3 (GAUZE/BANDAGES/DRESSINGS) IMPLANT
BLADE SURG 15 STRL LF DISP TIS (BLADE) IMPLANT
BLADE SURG 15 STRL SS (BLADE)
BNDG CMPR 9X4 STRL LF SNTH (GAUZE/BANDAGES/DRESSINGS)
BNDG COHESIVE 4X5 TAN STRL (GAUZE/BANDAGES/DRESSINGS) IMPLANT
BNDG CONFORM 3 STRL LF (GAUZE/BANDAGES/DRESSINGS) IMPLANT
BNDG ESMARK 4X9 LF (GAUZE/BANDAGES/DRESSINGS) IMPLANT
BNDG GAUZE ELAST 4 BULKY (GAUZE/BANDAGES/DRESSINGS) IMPLANT
CANISTER SUCT 1200ML W/VALVE (MISCELLANEOUS) IMPLANT
CORDS BIPOLAR (ELECTRODE) IMPLANT
COVER TABLE BACK 60X90 (DRAPES) IMPLANT
CUFF TOURNIQUET SINGLE 18IN (TOURNIQUET CUFF) IMPLANT
DECANTER SPIKE VIAL GLASS SM (MISCELLANEOUS) IMPLANT
DRAPE EXTREMITY T 121X128X90 (DRAPE) IMPLANT
DRAPE OEC MINIVIEW 54X84 (DRAPES) IMPLANT
ELECT NEEDLE TIP 2.8 STRL (NEEDLE) IMPLANT
ELECT REM PT RETURN 9FT ADLT (ELECTROSURGICAL)
ELECTRODE REM PT RTRN 9FT ADLT (ELECTROSURGICAL) IMPLANT
GLOVE BIOGEL PI IND STRL 8.5 (GLOVE) IMPLANT
GLOVE BIOGEL PI INDICATOR 8.5 (GLOVE)
GLOVE SURG ORTHO 8.0 STRL STRW (GLOVE) IMPLANT
GOWN STRL REUS W/ TWL XL LVL3 (GOWN DISPOSABLE) IMPLANT
GOWN STRL REUS W/TWL XL LVL3 (GOWN DISPOSABLE)
NEEDLE HYPO 25X1 1.5 SAFETY (NEEDLE) IMPLANT
NS IRRIG 1000ML POUR BTL (IV SOLUTION) IMPLANT
PACK BASIN DAY SURGERY FS (CUSTOM PROCEDURE TRAY) IMPLANT
PAD CAST 4YDX4 CTTN HI CHSV (CAST SUPPLIES) IMPLANT
PADDING CAST ABS 4INX4YD NS (CAST SUPPLIES)
PADDING CAST ABS COTTON 4X4 ST (CAST SUPPLIES) IMPLANT
PADDING CAST COTTON 4X4 STRL (CAST SUPPLIES)
PENCIL BUTTON HOLSTER BLD 10FT (ELECTRODE) IMPLANT
SLEEVE SCD COMPRESS KNEE MED (MISCELLANEOUS) IMPLANT
SLING ARM LRG ADULT FOAM STRAP (SOFTGOODS) IMPLANT
SLING ARM MED ADULT FOAM STRAP (SOFTGOODS) IMPLANT
SPLINT FIBERGLASS 3X35 (CAST SUPPLIES) IMPLANT
SPLINT FIBERGLASS 4X30 (CAST SUPPLIES) IMPLANT
SPONGE GAUZE 4X4 12PLY (GAUZE/BANDAGES/DRESSINGS) IMPLANT
STOCKINETTE 4X48 STRL (DRAPES) IMPLANT
STRIP CLOSURE SKIN 1/2X4 (GAUZE/BANDAGES/DRESSINGS) IMPLANT
SUCTION FRAZIER TIP 10 FR DISP (SUCTIONS) IMPLANT
SUT MNCRL AB 3-0 PS2 18 (SUTURE) IMPLANT
SUT MON AB 3-0 SH 27 (SUTURE)
SUT MON AB 3-0 SH27 (SUTURE) IMPLANT
SUT PROLENE 3 0 PS 1 (SUTURE) IMPLANT
SUT PROLENE 4 0 PS 2 18 (SUTURE) IMPLANT
SUT VIC AB 0 CT1 27 (SUTURE)
SUT VIC AB 0 CT1 27XBRD ANBCTR (SUTURE) IMPLANT
SUT VIC AB 2-0 PS2 27 (SUTURE) IMPLANT
SUT VIC AB 2-0 SH 27 (SUTURE)
SUT VIC AB 2-0 SH 27XBRD (SUTURE) IMPLANT
SUT VICRYL 4-0 PS2 18IN ABS (SUTURE) IMPLANT
SYR BULB 3OZ (MISCELLANEOUS) IMPLANT
SYR CONTROL 10ML LL (SYRINGE) IMPLANT
TOWEL OR 17X24 6PK STRL BLUE (TOWEL DISPOSABLE) IMPLANT
TRAY DSU PREP LF (CUSTOM PROCEDURE TRAY) IMPLANT
TUBE CONNECTING 20X1/4 (TUBING) IMPLANT
UNDERPAD 30X30 INCONTINENT (UNDERPADS AND DIAPERS) IMPLANT

## 2013-08-03 ENCOUNTER — Other Ambulatory Visit: Payer: Self-pay | Admitting: Orthopedic Surgery

## 2013-08-03 NOTE — Progress Notes (Signed)
Surgery r/s 1/28 to 1/30

## 2013-08-04 ENCOUNTER — Ambulatory Visit (HOSPITAL_BASED_OUTPATIENT_CLINIC_OR_DEPARTMENT_OTHER)
Admission: RE | Admit: 2013-08-04 | Discharge: 2013-08-04 | Disposition: A | Discharge status: Home / Self Care | Payer: Medicare Other | Source: Ambulatory Visit | Attending: Orthopedic Surgery | Admitting: Orthopedic Surgery

## 2013-08-04 ENCOUNTER — Encounter (HOSPITAL_BASED_OUTPATIENT_CLINIC_OR_DEPARTMENT_OTHER): Payer: Medicare Other | Admitting: Anesthesiology

## 2013-08-04 ENCOUNTER — Ambulatory Visit (HOSPITAL_BASED_OUTPATIENT_CLINIC_OR_DEPARTMENT_OTHER): Payer: Medicare Other | Admitting: Anesthesiology

## 2013-08-04 ENCOUNTER — Encounter (HOSPITAL_BASED_OUTPATIENT_CLINIC_OR_DEPARTMENT_OTHER)
Admission: RE | Disposition: A | Discharge status: Home / Self Care | Payer: Self-pay | Source: Ambulatory Visit | Attending: Orthopedic Surgery

## 2013-08-04 ENCOUNTER — Encounter (HOSPITAL_BASED_OUTPATIENT_CLINIC_OR_DEPARTMENT_OTHER): Payer: Self-pay | Admitting: Orthopedic Surgery

## 2013-08-04 DIAGNOSIS — F411 Generalized anxiety disorder: Secondary | ICD-10-CM | POA: Insufficient documentation

## 2013-08-04 DIAGNOSIS — M949 Disorder of cartilage, unspecified: Secondary | ICD-10-CM

## 2013-08-04 DIAGNOSIS — F329 Major depressive disorder, single episode, unspecified: Secondary | ICD-10-CM | POA: Insufficient documentation

## 2013-08-04 DIAGNOSIS — M1990 Unspecified osteoarthritis, unspecified site: Secondary | ICD-10-CM | POA: Insufficient documentation

## 2013-08-04 DIAGNOSIS — Y92009 Unspecified place in unspecified non-institutional (private) residence as the place of occurrence of the external cause: Secondary | ICD-10-CM | POA: Insufficient documentation

## 2013-08-04 DIAGNOSIS — D126 Benign neoplasm of colon, unspecified: Secondary | ICD-10-CM | POA: Insufficient documentation

## 2013-08-04 DIAGNOSIS — Z888 Allergy status to other drugs, medicaments and biological substances status: Secondary | ICD-10-CM | POA: Insufficient documentation

## 2013-08-04 DIAGNOSIS — K219 Gastro-esophageal reflux disease without esophagitis: Secondary | ICD-10-CM | POA: Insufficient documentation

## 2013-08-04 DIAGNOSIS — Z886 Allergy status to analgesic agent status: Secondary | ICD-10-CM | POA: Insufficient documentation

## 2013-08-04 DIAGNOSIS — S52599A Other fractures of lower end of unspecified radius, initial encounter for closed fracture: Secondary | ICD-10-CM | POA: Insufficient documentation

## 2013-08-04 DIAGNOSIS — F3289 Other specified depressive episodes: Secondary | ICD-10-CM | POA: Insufficient documentation

## 2013-08-04 DIAGNOSIS — M899 Disorder of bone, unspecified: Secondary | ICD-10-CM | POA: Insufficient documentation

## 2013-08-04 DIAGNOSIS — E781 Pure hyperglyceridemia: Secondary | ICD-10-CM | POA: Insufficient documentation

## 2013-08-04 DIAGNOSIS — Z87891 Personal history of nicotine dependence: Secondary | ICD-10-CM | POA: Insufficient documentation

## 2013-08-04 DIAGNOSIS — W19XXXA Unspecified fall, initial encounter: Secondary | ICD-10-CM | POA: Insufficient documentation

## 2013-08-04 DIAGNOSIS — J45909 Unspecified asthma, uncomplicated: Secondary | ICD-10-CM | POA: Insufficient documentation

## 2013-08-04 HISTORY — PX: ORIF WRIST FRACTURE: SHX2133

## 2013-08-04 SURGERY — OPEN REDUCTION INTERNAL FIXATION (ORIF) WRIST FRACTURE
Anesthesia: Regional | Site: Wrist | Laterality: Left

## 2013-08-04 MED ORDER — ONDANSETRON HCL 4 MG/2ML IJ SOLN
INTRAMUSCULAR | Status: DC | PRN
  Administered 2013-08-04: 4 mg via INTRAVENOUS

## 2013-08-04 MED ORDER — CHLORHEXIDINE GLUCONATE 4 % EX LIQD: 60.0000 mL | Freq: Once | CUTANEOUS | Status: DC

## 2013-08-04 MED ORDER — HYDROMORPHONE HCL PF 1 MG/ML IJ SOLN
0.2500 mg | INTRAMUSCULAR | Status: DC | PRN
  Administered 2013-08-04 (×4): 0.5 mg via INTRAVENOUS
  Filled 2013-08-04: qty 1

## 2013-08-04 MED ORDER — CEFAZOLIN SODIUM-DEXTROSE 2-3 GM-% IV SOLR
INTRAVENOUS | Status: AC
  Filled 2013-08-04: qty 50

## 2013-08-04 MED ORDER — HYDROMORPHONE HCL PF 1 MG/ML IJ SOLN
INTRAMUSCULAR | Status: AC
  Filled 2013-08-04: qty 1

## 2013-08-04 MED ORDER — OXYCODONE HCL 5 MG/5ML PO SOLN: 5.0000 mg | Freq: Once | ORAL | Status: DC | PRN

## 2013-08-04 MED ORDER — FENTANYL CITRATE 0.05 MG/ML IJ SOLN
50.0000 ug | INTRAMUSCULAR | Status: DC | PRN
  Administered 2013-08-04: 100 ug via INTRAVENOUS

## 2013-08-04 MED ORDER — PROPOFOL 10 MG/ML IV BOLUS
INTRAVENOUS | Status: DC | PRN
  Administered 2013-08-04: 180 mg via INTRAVENOUS

## 2013-08-04 MED ORDER — LIDOCAINE HCL (CARDIAC) 20 MG/ML IV SOLN
INTRAVENOUS | Status: DC | PRN
  Administered 2013-08-04: 75 mg via INTRAVENOUS

## 2013-08-04 MED ORDER — MIDAZOLAM HCL 2 MG/2ML IJ SOLN
1.0000 mg | INTRAMUSCULAR | Status: DC | PRN
  Administered 2013-08-04: 2 mg via INTRAVENOUS

## 2013-08-04 MED ORDER — LACTATED RINGERS IV SOLN
INTRAVENOUS | Status: DC
  Administered 2013-08-04 (×2): via INTRAVENOUS

## 2013-08-04 MED ORDER — OXYCODONE HCL 5 MG PO TABS: 5.0000 mg | ORAL_TABLET | Freq: Once | ORAL | Status: DC | PRN

## 2013-08-04 MED ORDER — FENTANYL CITRATE 0.05 MG/ML IJ SOLN
INTRAMUSCULAR | Status: DC | PRN
  Administered 2013-08-04 (×3): 25 ug via INTRAVENOUS

## 2013-08-04 MED ORDER — MIDAZOLAM HCL 2 MG/2ML IJ SOLN
INTRAMUSCULAR | Status: AC
  Filled 2013-08-04: qty 2

## 2013-08-04 MED ORDER — CEFAZOLIN SODIUM-DEXTROSE 2-3 GM-% IV SOLR
2.0000 g | INTRAVENOUS | Status: AC
  Administered 2013-08-04: 2 g via INTRAVENOUS

## 2013-08-04 MED ORDER — FENTANYL CITRATE 0.05 MG/ML IJ SOLN
INTRAMUSCULAR | Status: AC
  Filled 2013-08-04: qty 2

## 2013-08-04 MED ORDER — OXYCODONE-ACETAMINOPHEN 10-325 MG PO TABS: 1.0000 | ORAL_TABLET | ORAL | Status: DC | PRN

## 2013-08-04 MED ORDER — ONDANSETRON HCL 4 MG/2ML IJ SOLN: 4.0000 mg | Freq: Four times a day (QID) | INTRAMUSCULAR | Status: DC | PRN

## 2013-08-04 MED ORDER — PROPOFOL 10 MG/ML IV EMUL
INTRAVENOUS | Status: AC
  Filled 2013-08-04: qty 50

## 2013-08-04 SURGICAL SUPPLY — 68 items
BIT DRILL SOLID 2.0X40MM (BIT) ×1 IMPLANT
BIT DRILL SOLID 2.5X40MM (BIT) ×1 IMPLANT
BLADE MINI RND TIP GREEN BEAV (BLADE) IMPLANT
BLADE SURG 15 STRL LF DISP TIS (BLADE) ×1 IMPLANT
BLADE SURG 15 STRL SS (BLADE) ×2
BNDG CMPR 9X4 STRL LF SNTH (GAUZE/BANDAGES/DRESSINGS) ×1
BNDG COHESIVE 3X5 TAN STRL LF (GAUZE/BANDAGES/DRESSINGS) ×2 IMPLANT
BNDG ESMARK 4X9 LF (GAUZE/BANDAGES/DRESSINGS) ×2 IMPLANT
BNDG GAUZE ELAST 4 BULKY (GAUZE/BANDAGES/DRESSINGS) ×2 IMPLANT
BONE CHIP PRESERV 5CC PCAN5 (Bone Implant) ×2 IMPLANT
CHLORAPREP W/TINT 26ML (MISCELLANEOUS) ×2 IMPLANT
CORDS BIPOLAR (ELECTRODE) ×2 IMPLANT
COVER MAYO STAND STRL (DRAPES) ×2 IMPLANT
COVER TABLE BACK 60X90 (DRAPES) ×2 IMPLANT
CUFF TOURNIQUET SINGLE 18IN (TOURNIQUET CUFF) ×2 IMPLANT
DECANTER SPIKE VIAL GLASS SM (MISCELLANEOUS) IMPLANT
DRAPE EXTREMITY T 121X128X90 (DRAPE) ×2 IMPLANT
DRAPE OEC MINIVIEW 54X84 (DRAPES) ×2 IMPLANT
DRAPE SURG 17X23 STRL (DRAPES) ×2 IMPLANT
DRILL SOLID 2.0X40MM (BIT) ×2
DRILL SOLID 2.5X40MM (BIT) ×2
DRSG KUZMA FLUFF (GAUZE/BANDAGES/DRESSINGS) IMPLANT
ELECT REM PT RETURN 9FT ADLT (ELECTROSURGICAL)
ELECTRODE REM PT RTRN 9FT ADLT (ELECTROSURGICAL) IMPLANT
GAUZE XEROFORM 1X8 LF (GAUZE/BANDAGES/DRESSINGS) ×2 IMPLANT
GLOVE BIO SURGEON STRL SZ7.5 (GLOVE) ×2 IMPLANT
GLOVE BIOGEL PI IND STRL 7.0 (GLOVE) ×1 IMPLANT
GLOVE BIOGEL PI IND STRL 8 (GLOVE) ×1 IMPLANT
GLOVE BIOGEL PI IND STRL 8.5 (GLOVE) ×1 IMPLANT
GLOVE BIOGEL PI INDICATOR 7.0 (GLOVE) ×1
GLOVE BIOGEL PI INDICATOR 8 (GLOVE) ×1
GLOVE BIOGEL PI INDICATOR 8.5 (GLOVE) ×1
GLOVE ECLIPSE 6.5 STRL STRAW (GLOVE) ×2 IMPLANT
GLOVE SURG ORTHO 8.0 STRL STRW (GLOVE) ×4 IMPLANT
GOWN STRL REUS W/ TWL LRG LVL3 (GOWN DISPOSABLE) ×1 IMPLANT
GOWN STRL REUS W/TWL LRG LVL3 (GOWN DISPOSABLE) ×2
GOWN STRL REUS W/TWL XL LVL3 (GOWN DISPOSABLE) ×4 IMPLANT
GUIDE AIMING 1.5MM (WIRE) ×4 IMPLANT
K-WIRE .045X4 (WIRE) IMPLANT
NS IRRIG 1000ML POUR BTL (IV SOLUTION) ×2 IMPLANT
PACK BASIN DAY SURGERY FS (CUSTOM PROCEDURE TRAY) ×2 IMPLANT
PAD CAST 3X4 CTTN HI CHSV (CAST SUPPLIES) ×1 IMPLANT
PADDING CAST ABS 3INX4YD NS (CAST SUPPLIES)
PADDING CAST ABS 4INX4YD NS (CAST SUPPLIES) ×1
PADDING CAST ABS COTTON 3X4 (CAST SUPPLIES) IMPLANT
PADDING CAST ABS COTTON 4X4 ST (CAST SUPPLIES) ×1 IMPLANT
PADDING CAST COTTON 3X4 STRL (CAST SUPPLIES) ×1
PENCIL BUTTON HOLSTER BLD 10FT (ELECTRODE) IMPLANT
SKELETAL DYNAMICS DVR SET (Set) ×2 IMPLANT
SLEEVE SCD COMPRESS KNEE MED (MISCELLANEOUS) ×2 IMPLANT
SPLINT PLASTER CAST XFAST 3X15 (CAST SUPPLIES) ×10 IMPLANT
SPLINT PLASTER XTRA FASTSET 3X (CAST SUPPLIES) ×10
SPONGE GAUZE 4X4 12PLY (GAUZE/BANDAGES/DRESSINGS) ×2 IMPLANT
STOCKINETTE 4X48 STRL (DRAPES) ×2 IMPLANT
SUT VIC AB 0 CT1 27 (SUTURE)
SUT VIC AB 0 CT1 27XBRD ANBCTR (SUTURE) IMPLANT
SUT VIC AB 2-0 SH 27 (SUTURE)
SUT VIC AB 2-0 SH 27XBRD (SUTURE) IMPLANT
SUT VIC AB 3-0 FS2 27 (SUTURE) IMPLANT
SUT VICRYL 4-0 PS2 18IN ABS (SUTURE) ×2 IMPLANT
SUT VICRYL RAPID 5 0 P 3 (SUTURE) IMPLANT
SUT VICRYL RAPIDE 4/0 PS 2 (SUTURE) ×2 IMPLANT
SYR BULB 3OZ (MISCELLANEOUS) ×2 IMPLANT
SYR CONTROL 10ML LL (SYRINGE) IMPLANT
TOWEL OR 17X24 6PK STRL BLUE (TOWEL DISPOSABLE) ×4 IMPLANT
UNDERPAD 30X30 INCONTINENT (UNDERPADS AND DIAPERS) ×2 IMPLANT
WIRE FIX 1.5 STANDARD TIP (WIRE) ×6
WIRE FIX 1.5 STD TIP (WIRE) ×3 IMPLANT

## 2013-08-04 NOTE — Transfer of Care (Signed)
Immediate Anesthesia Transfer of Care Note  Patient: [name]  Procedure(s) Performed: Procedure(s): OPEN REDUCTION INTERNAL FIXATION (ORIF) LEFT DISTAL RADIUS WRIST FRACTURE (Left)  Patient Location: PACU  Anesthesia Type:GA combined with regional for post-op pain  Level of Consciousness: awake, oriented and patient cooperative  Airway & Oxygen Therapy: Patient Spontanous Breathing and Patient connected to face mask oxygen  Post-op Assessment: Report given to PACU RN and Post -op Vital signs reviewed and stable  Post vital signs: Reviewed and stable  Complications: No apparent anesthesia complications

## 2013-08-04 NOTE — Op Note (Signed)
Dictation number [id number]

## 2013-08-04 NOTE — Anesthesia Procedure Notes (Addendum)
Procedure Name: LMA Insertion Date/Time: 08/04/2013 2:26 PM Performed by: Lyndee Leo Pre-anesthesia Checklist: Patient identified, Emergency Drugs available, Suction available and Patient being monitored Patient Re-evaluated:Patient Re-evaluated prior to inductionOxygen Delivery Method: Circle System Utilized Preoxygenation: Pre-oxygenation with 100% oxygen Intubation Type: IV induction Ventilation: Mask ventilation without difficulty LMA: LMA inserted LMA Size: 4.0 Number of attempts: 1 Airway Equipment and Method: bite block Placement Confirmation: positive ETCO2 Tube secured with: Tape Dental Injury: Teeth and Oropharynx as per pre-operative assessment    Anesthesia Regional Block:  Supraclavicular block  Pre-Anesthetic Checklist: ,, timeout performed, Correct Patient, Correct Site, Correct Laterality, Correct Procedure, Correct Position, site marked, Risks and benefits discussed,  Surgical consent,  Pre-op evaluation,  At surgeon's request and post-op pain management  Laterality: Left  Prep: chloraprep       Needles:  Injection technique: Single-shot  Needle Type: Echogenic Stimulator Needle     Needle Length: 5cm 5 cm Needle Gauge: 22 and 22 G    Additional Needles:  Procedures: ultrasound guided (picture in chart) and nerve stimulator Supraclavicular block  Nerve Stimulator or Paresthesia:  Response: biceps flexion, 0.45 mA,   Additional Responses:   Narrative:  Start time: 08/04/2013 2:02 PM End time: 08/04/2013 2:15 PM Injection made incrementally with aspirations every 5 mL.  Performed by: Personally  Anesthesiologist: Dr Marcie Bal  Additional Notes: Functioning IV was confirmed and monitors were applied.  A 72mm 22ga Arrow echogenic stimulator needle was used. Sterile prep and drape,hand hygiene and sterile gloves were used.  Negative aspiration and negative test dose prior to incremental administration of local anesthetic. The patient tolerated the  procedure well.  Ultrasound guidance: relevent anatomy identified, needle position confirmed, local anesthetic spread visualized around nerve(s), vascular puncture avoided.  Image printed for medical record.

## 2013-08-04 NOTE — H&P (Signed)
[name] is a 73 year old right hand dominant female who suffered a fall at her home on 07-31-13. She was originally seen by Dr. Caralyn Guile and scheduled for surgery which got cancelled. She has requested being seen. She is seen at her request. She is in a splint on her left side. She has no prior history of injury. She has been taking Vicodin. She complains of constant, severe, aching pain with a feeling of swelling.  Any motion to her fingers causes discomfort for her. She has no history of diabetes or thyroid problems. She does have a history of arthritis. She has no history gout. There is a family history of arthritis.  PAST MEDICAL HISTORY: She has no known drug allergies. She is on the following medications: Fenofibrate, Fluoxetine, Trazodone, Vicodin, Famotidine, Omeprazole, Tramadol HCL, OTC antihistamines and OTC Delsym. She has had an appendectomy, hysterectomy, and a fractured leg.  FAMILY H ISTORY: Negative.  SOCIAL HISTORY: She does not smoke or drink. She is married and retired.  REVIEW OF SYSTEMS: Positive for glasses, cataracts, asthma, cough, otherwise negative for 14 points. [name] is an 73 y.o. female.   Chief Complaint: comminuted intra-articulr fracture left wrist HPI: see above  Past Medical History  Diagnosis Date  . Osteopenia   . Hypertriglyceridemia   . Depression with anxiety   . GERD (gastroesophageal reflux disease)   . OA (osteoarthritis)   . Colon polyp   . Atrophic vaginitis   . Kidney stones   . Contact lens/glasses fitting     wears contacts or glasses  . Asthma     Past Surgical History  Procedure Laterality Date  . Partial hysterectomy    . Appendectomy    . Colonoscopy      Family History  Problem Relation Age of Onset  . Stroke Father   . Lung cancer Sister     smoked  . Breast cancer Daughter 57  . Asthma Father   . Heart disease Father     CABG x 2   Social History:  reports that she quit smoking about 35 years ago.  Her smoking use included Cigarettes. She has a 15 pack-year smoking history. She has never used smokeless tobacco. She reports that she does not drink alcohol or use illicit drugs.  Allergies:  Allergies  Allergen Reactions  . Nsaids     Elevated kidney fx  . Prednisone     Nausea     No prescriptions prior to admission    Results for orders placed during the hospital encounter of 08/02/13 (from the past 48 hour(s))  POCT HEMOGLOBIN-HEMACUE     Status: None   Collection Time    08/02/13 12:42 PM      Result Value Range   Hemoglobin 12.5  12.0 - 15.0 g/dL    No results found.   Pertinent items are noted in HPI.  Height 5\' 4"  (1.626 m), weight 193 lb (87.544 kg).  General appearance: alert, cooperative and appears stated age Head: Normocephalic, without obvious abnormality Neck: no JVD Resp: clear to auscultation bilaterally Cardio: regular rate and rhythm, S1, S2 normal, no murmur, click, rub or gallop GI: soft, non-tender; bowel sounds normal; no masses,  no organomegaly Extremities: extremities normal, atraumatic, no cyanosis or edema, splint left forearm Pulses: 2+ and symmetric Skin: Skin color, texture, turgor normal. No rashes or lesions Neurologic: Grossly normal Incision/Wound: na  Assessment/Plan  X-rays from Dr. Angus Palms office and repeated here 4 views reveal a comminuted intraarticular  fracture of her left distal radius. She has significant osteopenia.  We would recommend ORIF bone grafting. This will be scheduled as an outpatient. The pre, peri and post op course are discussed along with risks and complications.  She is aware there is no guarantee with surgery, possibility of infection, recurrence, injury to arteries, nerves and tendons, incomplete relief of symptoms, loss of fixation, nonunion and dystrophy. This will be scheduled as an outpatient under regional   Emi Lymon R 08/04/2013, 8:57 AM

## 2013-08-04 NOTE — Anesthesia Postprocedure Evaluation (Signed)
  Anesthesia Post-op Note  Patient: [name]  Procedure(s) Performed: Procedure(s): OPEN REDUCTION INTERNAL FIXATION (ORIF) LEFT DISTAL RADIUS WRIST FRACTURE (Left)  Patient Location: PACU  Anesthesia Type:General  Level of Consciousness: awake, alert  and oriented  Airway and Oxygen Therapy: Patient Spontanous Breathing  Post-op Pain: mild  Post-op Assessment: Post-op Vital signs reviewed  Post-op Vital Signs: Reviewed  Complications: No apparent anesthesia complications

## 2013-08-04 NOTE — Progress Notes (Signed)
Assisted Dr. Marcie Bal with left, ultrasound guided, supraclavicular block. Side rails up, monitors on throughout procedure. See vital signs in flow sheet. Tolerated Procedure well.

## 2013-08-04 NOTE — Discharge Instructions (Addendum)
°  Post Anesthesia Home Care Instructions  Activity: Get plenty of rest for the remainder of the day. A responsible adult should stay with you for 24 hours following the procedure.  For the next 24 hours, DO NOT: -Drive a car -Paediatric nurse -Drink alcoholic beverages -Take any medication unless instructed by your physician -Make any legal decisions or sign important papers.  Meals: Start with liquid foods such as gelatin or soup. Progress to regular foods as tolerated. Avoid greasy, spicy, heavy foods. If nausea and/or vomiting occur, drink only clear liquids until the nausea and/or vomiting subsides. Call your physician if vomiting continues.  Special Instructions/Symptoms: Your throat may feel dry or sore from the anesthesia or the breathing tube placed in your throat during surgery. If this causes discomfort, gargle with warm salt water. The discomfort should disappear within 24 hours.     Regional Anesthesia Blocks  1. Numbness or the inability to move the "blocked" extremity may last from 3-48 hours after placement. The length of time depends on the medication injected and your individual response to the medication. If the numbness is not going away after 48 hours, call your surgeon.  2. The extremity that is blocked will need to be protected until the numbness is gone and the  Strength has returned. Because you cannot feel it, you will need to take extra care to avoid injury. Because it may be weak, you may have difficulty moving it or using it. You may not know what position it is in without looking at it while the block is in effect.  3. For blocks in the legs and feet, returning to weight bearing and walking needs to be done carefully. You will need to wait until the numbness is entirely gone and the strength has returned. You should be able to move your leg and foot normally before you try and bear weight or walk. You will need someone to be with you when you first try to  ensure you do not fall and possibly risk injury.  4. Bruising and tenderness at the needle site are common side effects and will resolve in a few days.  5. Persistent numbness or new problems with movement should be communicated to the surgeon or the [place] [phone] [place] [phone]).     [facility] Instructions Hand Surgery  Wound Care: Keep your hand elevated above the level of your heart.  Do not allow it to dangle by your side.  Keep the dressing dry and do not remove it unless your doctor advises you to do so.  He will usually change it at the time of your post-op visit.  Moving your fingers is advised to stimulate circulation but will depend on the site of your surgery.  If you have a splint applied, your doctor will advise you regarding movement.  Activity: Do not drive or operate machinery today.  Rest today and then you may return to your normal activity and work as indicated by your physician.  Diet:  Drink liquids today or eat a light diet.  You may resume a regular diet tomorrow.    General expectations: Pain for two to three days. Fingers may become slightly swollen.  Call your doctor if any of the following occur: Severe pain not relieved by pain medication. Elevated temperature. Dressing soaked with blood. Inability to move fingers. White or bluish color to fingers.

## 2013-08-04 NOTE — Brief Op Note (Signed)
08/04/2013  3:44 PM  PATIENT:  [name]  73 y.o. female  PRE-OPERATIVE DIAGNOSIS:  LEFT DISTAL RADIUS FRACTURE  POST-OPERATIVE DIAGNOSIS:  left distal radius fracture  PROCEDURE:  Procedure(s): OPEN REDUCTION INTERNAL FIXATION (ORIF) LEFT DISTAL RADIUS WRIST FRACTURE (Left)  SURGEON:  Surgeon(s) and Role:    * Wynonia Sours, MD - Primary  PHYSICIAN ASSISTANT:   ASSISTANTS: K Annakate Soulier,MD   ANESTHESIA:   regional and general  EBL:  Total I/O In: 1000 [I.V.:1000] Out: -   BLOOD ADMINISTERED:none  DRAINS: none   LOCAL MEDICATIONS USED:  NONE  SPECIMEN:  No Specimen  DISPOSITION OF SPECIMEN:  N/A  COUNTS:  YES  TOURNIQUET:   Total Tourniquet Time Documented: Upper Arm (Left) - 62 minutes Total: Upper Arm (Left) - 62 minutes   DICTATION: .Other Dictation: Dictation Number [id number]  PLAN OF CARE: Discharge to home after PACU  PATIENT DISPOSITION:  PACU - hemodynamically stable.

## 2013-08-04 NOTE — Anesthesia Preprocedure Evaluation (Signed)
Anesthesia Evaluation  Patient identified by MRN, date of birth, ID band Patient awake    Reviewed: Allergy & Precautions, H&P , NPO status , Patient's Chart, lab work & pertinent test results  Airway Mallampati: II  Neck ROM: full    Dental   Pulmonary asthma , former smoker,          Cardiovascular     Neuro/Psych    GI/Hepatic GERD-  ,  Endo/Other    Renal/GU      Musculoskeletal  (+) Arthritis -,   Abdominal   Peds  Hematology   Anesthesia Other Findings   Reproductive/Obstetrics                           Anesthesia Physical Anesthesia Plan  ASA: II  Anesthesia Plan: General and Regional   Post-op Pain Management:    Induction: Intravenous  Airway Management Planned: LMA  Additional Equipment:   Intra-op Plan:   Post-operative Plan:   Informed Consent: I have reviewed the patients History and Physical, chart, labs and discussed the procedure including the risks, benefits and alternatives for the proposed anesthesia with the patient or authorized representative who has indicated his/her understanding and acceptance.     Plan Discussed with: Anesthesiologist, Surgeon and CRNA  Anesthesia Plan Comments:         Anesthesia Quick Evaluation

## 2013-08-07 ENCOUNTER — Encounter (HOSPITAL_BASED_OUTPATIENT_CLINIC_OR_DEPARTMENT_OTHER): Payer: Self-pay | Admitting: Orthopedic Surgery

## 2013-08-07 NOTE — Addendum Note (Signed)
Addendum created 08/07/13 0814 by Albertha Ghee, MD   Modules edited: Anesthesia Blocks and Procedures

## 2013-08-07 NOTE — Op Note (Signed)
[name], [name]            ACCOUNT NO.:  [id number]  MEDICAL RECORD NO.:  [id number]  LOCATION:                               FACILITY:  [facility]  PHYSICIAN:  Daryll Brod, M.D.       DATE OF BIRTH:  [birth year]  DATE OF PROCEDURE:  08/04/2013 DATE OF DISCHARGE:  08/04/2013                              OPERATIVE REPORT   PREOPERATIVE DIAGNOSIS:  Comminuted intra-articular distal radius fracture, left wrist.  POSTOPERATIVE DIAGNOSIS:  Comminuted intra-articular distal radius fracture, left wrist.  OPERATION:  Open reduction and internal fixation with bone grafting left distal radius using [employer] volar plate.  SURGEON:  Daryll Brod, MD  ASSISTANT:  Leanora Cover, MD  ANESTHESIA:  Supraclavicular block general.  ANESTHESIOLOGIST:  Albertha Ghee, MD  HISTORY:  The patient is a 73 year old female with a history of a markedly comminuted intra-articular fracture of her left distal radius. She is admitted for open reduction and internal fixation bone grafting. Pre, peri, and postoperative course have been discussed along with risks and complications.  She is aware that there is no guarantee with the surgery, possibility of infection; recurrence of injury to arteries, nerves, tendons, incomplete relief of symptoms, and dystrophy.  In preoperative area, the patient was seen, the extremity marked by both patient and surgeon.  Antibiotic given.  PROCEDURE IN DETAIL:  The patient was brought to the operating room, where a supraclavicular block general anesthetic were carried out without difficulty.  She was prepped using ChloraPrep, supine position, left arm free.  A 3-minute dry time was allowed.  Time-out taken, confirming the patient and procedure.  The limb was exsanguinated with an Esmarch bandage.  Tourniquet placed high and the arm was inflated to 250 mmHg.  A volar radial incision was made, carried down through subcutaneous tissue.  Bleeders were electrocauterized  with bipolar. Dissection was carried through the flexor carpi radialis tendon sheath. The pronator quadratus was incised.  This was markedly damaged distally with the fracture being __________ into the distal segment revealing a significant crush to the chondral bone, metaphysis of the distal radius. Multiple fragments were present distally.  The dissection was then carried dorsally.  The brachioradialis was incised allowing visualization with the extended incision, a proximal shaft was then brought into the wound and visualization of the subchondral subcortical bone was identified distally.  The fragments were then compressed, 5 mL of bone graft.  Chips were soaked.  These were then packed into the distal radius.  The fracture was then reduced, brought out to normal length.  A standard left distal volar plate [employer] was selected, this was placed, pinned in position, was found to be slightly distal.  This was then reapproximated.  Pins were removed.  The plate was readjusted and fixed, this felt to lie in good position with the pins directly underneath the subchondral bone distally.  Pressure was maintained on all fragments to maintain them in position.  A 12 mm screw was placed in the gliding hole.  The distal pins were then inserted with 1 screw to compress the fracture on the ulnar aspect, the remaining were placed as locking PEG's, these measured between 18 and 22 mm.  X-rays confirmed that none were in the joint, all were directly beneath the subchondral bone.  The distal radius was firmly fixed.  The proximal screws were then inserted.  The 12 mm screw was placed more distally, a locking screw of 10 mm placed proximally and a 10 mm in the gliding hole.  This firmly fixed the fracture in position, pronation supination was full.  The wound was copiously irrigated with saline.  The pronator quadratus was repaired as much as possible with figure-of-eight, 4-0 Vicryl sutures,  subcutaneous tissue closed with interrupted 4-0 Vicryl and the skin with interrupted 4-0 Vicryl Rapide.  A sterile compressive dressing, dorsal palmar splint applied.  X-rays confirmed good positioning of all plate and screws, none within the joint.  A sterile compressive dressing, dorsal palmar splint was applied.  On deflation of the tourniquet, all fingers immediately pinked.  She was taken to the recovery room for observation in satisfactory condition.  She will be discharged home to return in 1 week on Percocet.          ______________________________ Daryll Brod, M.D.     GK/MEDQ  D:  08/04/2013  T:  08/05/2013  Job:  [id number]

## 2013-08-08 ENCOUNTER — Ambulatory Visit (INDEPENDENT_AMBULATORY_CARE_PROVIDER_SITE_OTHER): Payer: Medicare Other | Admitting: Adult Health

## 2013-08-08 ENCOUNTER — Encounter: Payer: Self-pay | Admitting: Adult Health

## 2013-08-08 VITALS — BP 126/72 | HR 75 | Temp 98.4°F | Ht 64.0 in | Wt 196.8 lb

## 2013-08-08 DIAGNOSIS — R059 Cough, unspecified: Secondary | ICD-10-CM

## 2013-08-08 DIAGNOSIS — R05 Cough: Secondary | ICD-10-CM

## 2013-08-08 MED ORDER — TRAMADOL HCL 50 MG PO TABS: ORAL_TABLET | ORAL | Status: DC

## 2013-08-08 NOTE — Progress Notes (Signed)
Subjective:    Patient ID: [name], female    DOB: 1940/07/19  MRN: [id number] Brief patient profile:  36 yowf quit smoking 1980 allergies itching and sneezing took shots much improved when stopped shots and stayed  much better except some problem spring and fall and did ok until 40's then developed recurrent cough typically assoc with URI's and not with spring and fall  But much worse since  Benson 2014 despite rx for  Asthma and gerd so referred 05/30/2013 to pulmonary clinic by Dr Lawernce Pitts  History of Present Illness  05/30/2013 1st [facility] office visit/ Wert cc daily cough x months which is so severe gets gagged and occ vomit and pos urinary incontinent.  Clear mucus esp p shower, no better with inhalers and acid suppression to date  rec  First take delsym two tsp every 12 hours and supplement if needed with  tramadol 50 mg up to 2 every 4 hours to suppress the urge to cough at all or even clear your throat. Swallowing water or using ice chips/non mint and menthol containing candies (such as lifesavers or sugarless jolly ranchers) are also effective.  You should rest your voice and avoid activities that you know make you cough. Once you have eliminated the cough for 3 straight days try reducing the tramadol first,  then the delsym as tolerated.   Try prilosec 20mg   Take x 2  30-60 min before first meal of the day and Pepcid 20 mg one bedtime  Prednisone 10 mg take  4 each am x 2 days,   2 each am x 2 days,  1 each am x 2 days and stop   GERD diet    06/27/2013 f/u ov/Wert re: 0 % better chronic cough x Jan 2014 / using saba for cough Chief Complaint  Patient presents with  . Follow-up    Pt states her SOB and wheezing are unchanged. Her cough is slightly improved since last visit. No new co's.    rec Please see patient coordinator before you leave today  to schedule sinus ct  Only use your albuterol/proaire as a rescue medication  First take delsym two tsp every 12  hours and supplement if needed with  Tylenol #3 one every 4 hours to suppress the urge to cough at all or even clear your throat.   Once you have eliminated the cough for 3 straight days try reducing the tylenol #3 ,  then the delsym as tolerated.  Keep flutter valve handy and use it when you start cough  Omeprazole 40 mg  Take x 2  30-60 min before first meal of the day and Pepcid 20 mg one bedtime and chlotrimeton 4 mg one at bedtime GERD diet   Please schedule a follow up office visit in 4 weeks, sooner if needed bring all medications with you when return.    07/25/2013 f/u ov/Wert re: cough x > one year / did not bring all meds "they are the same" Chief Complaint  Patient presents with  . Follow-up    Pt states her SOB and wheezing have improved some but her cough is some worse. Cough is prod with very minimal clear sputum.       Kouffman Reflux v Neurogenic Cough Differentiator Reflux Comments  Do you awaken from a sound sleep coughing violently?  With trouble breathing? Yes   Do you have choking episodes when you cannot  Get enough air, gasping for air ?              Yes   Do you usually cough when you lie down into  The bed, or when you just lie down to rest ?                          Recliner x 6m   Do you usually cough after meals or eating?         sometime   Do you cough when (or after) you bend over?    Yes   GERD SCORE     Kouffman Reflux v Neurogenic Cough Differentiator Neurogenic   Do you more-or-less cough all day long? yes   Does change of temperature make you cough? yes   Does laughing or chuckling cause you to cough? yes   Do fumes (perfume, automobile fumes, burned  Toast, etc.,) cause you to cough ?      no   Does speaking, singing, or talking on the phone cause you to cough   ?               yes   Neurogenic/Airway score     >>prednisone taper , PPI /GERD, cough control rx   08/08/2013 Follow up and Med review  Patient returns for a  followup and medication review. We reviewed all her medications and organized them into a medication calendar with patient education. Last visit. Patient was seen for persistent cough. She was placed on aggressive reflux, and rhinitis prevention regimen. Along with cough control, which appears patient reports that she is improved. Her shortness, of breath and wheezing have resolved, and her dry cough has improved substantially. Patient denies any hemoptysis, orthopnea, PND, or leg swelling.   [place] and broke wrist last week requiring surgery.      Current Medications, Allergies, Complete Past Medical History, Past Surgical History, Family History, and Social History were reviewed in [employer] record.  ROS  The following are not active complaints unless bolded sore throat, dysphagia, dental problems, itching, sneezing,  nasal congestion or excess/ purulent secretions, ear ache,   fever, chills, sweats, unintended wt loss, pleuritic or exertional cp, hemoptysis,  orthopnea pnd or leg swelling, presyncope, palpitations, heartburn, abdominal pain, anorexia, nausea, vomiting, diarrhea  or change in bowel or urinary habits, change in stools or urine, dysuria,hematuria,  rash, arthralgias, visual complaints, headache, numbness weakness or ataxia or problems with walking or coordination,  change in mood/affect or memory.           Objective:   Physical Exam  amb wf nad with harsh barking cough  07/25/2013       197>>196 08/08/2013        HEENT: nl dentition, turbinates, and orophanx. Nl external ear canals without cough reflex   NECK :  without JVD/Nodes/TM/ nl carotid upstrokes bilaterally   LUNGS: no acc muscle use, clear to A and P bilaterally without cough on insp or exp maneuvers   CV:  RRR  no s3 or murmur or increase in P2, no edema   ABD:  soft and nontender with nl excursion in the supine position. No bruits or organomegaly, bowel sounds nl  MS:  warm  without deformities, calf tenderness, cyanosis or clubbing  SKIN: warm and dry without lesions    NEURO:  alert, approp,  no deficits    cxr 02/23/13 R midlung atx or infiltrate also ? Lingular atx  CXR  06/27/2013 : Poorly defined density in the region of the right middle lobe further evaluation with contrasted chest CT recommended. Atelectasis versus infiltrate in region of the lingula.  CT chest c contrast 07/11/13 1. No acute findings in the thorax to account for the patient's  symptoms.  2. However, there is a subsolid nodule in the anterior aspect of the  right lower lobe that has a ground-glass attenuation component  measuring 2.5 x 1.8 cm, and a small central solid component  measuring 4 mm. Initial follow-up by chest CT without contrast is  recommended in 3 months to confirm persistence.          Assessment & Plan:

## 2013-08-08 NOTE — Patient Instructions (Signed)
Follow med calendar closely and bring to each visit.  Continue on cough control regimen . Use caution with pain meds as these meds can make you sleepy and off balance  follow up Dr. Melvyn Novas  In 4 weeks and As needed

## 2013-08-09 ENCOUNTER — Ambulatory Visit (HOSPITAL_COMMUNITY): Admission: RE | Admit: 2013-08-09 | Payer: Medicare Other | Source: Ambulatory Visit | Admitting: Orthopedic Surgery

## 2013-08-09 ENCOUNTER — Encounter (HOSPITAL_COMMUNITY): Admission: RE | Payer: Self-pay | Source: Ambulatory Visit

## 2013-08-09 SURGERY — OPEN REDUCTION INTERNAL FIXATION (ORIF) WRIST FRACTURE
Anesthesia: General | Site: Wrist | Laterality: Left

## 2013-08-11 NOTE — Assessment & Plan Note (Signed)
Cough improved on aggressive AR/GERD regimen  Advised on sedating effects of narcotics  Patient's medications were reviewed today and patient education was given. Computerized medication calendar was adjusted/completed   Plan  Follow med calendar closely and bring to each visit.  Continue on cough control regimen . Use caution with pain meds as these meds can make you sleepy and off balance  follow up Dr. Melvyn Novas  In 4 weeks and As needed

## 2013-09-05 ENCOUNTER — Ambulatory Visit (INDEPENDENT_AMBULATORY_CARE_PROVIDER_SITE_OTHER): Payer: Medicare Other | Admitting: Internal Medicine

## 2013-09-05 ENCOUNTER — Encounter: Payer: Self-pay | Admitting: Internal Medicine

## 2013-09-05 VITALS — BP 136/78 | HR 74 | Temp 98.0°F | Ht 64.0 in | Wt 197.8 lb

## 2013-09-05 DIAGNOSIS — R05 Cough: Secondary | ICD-10-CM

## 2013-09-05 DIAGNOSIS — R059 Cough, unspecified: Secondary | ICD-10-CM

## 2013-09-05 DIAGNOSIS — R918 Other nonspecific abnormal finding of lung field: Secondary | ICD-10-CM

## 2013-09-05 NOTE — Patient Instructions (Addendum)
Ok to take chlortrimeton up to one every 4 hours as needed for tickle/ drainage.  Use sugar free jolly ranchers or lifesavers for urge to clear throat    See calendar for specific medication instructions and bring it back for each and every office visit for every healthcare provider you see.  Without it,  you may not receive the best quality medical care that we feel you deserve.  You will note that the calendar groups together  your maintenance  medications that are timed at particular times of the day.  Think of this as your checklist for what your doctor has instructed you to do until your next evaluation to see what benefit  there is  to staying on a consistent group of medications intended to keep you well.  The other group at the bottom is entirely up to you to use as you see fit  for specific symptoms that may arise between visits that require you to treat them on an as needed basis.  Think of this as your action plan or "what if" list.   Separating the top medications from the bottom group is fundamental to providing you adequate care going forward.    We need to see you the same day you have the CT scan - we will call to arrange both

## 2013-09-05 NOTE — Progress Notes (Signed)
Subjective:    Patient ID: [name], female    DOB: 1941-05-05  MRN: [id number] Brief patient profile:  45 yowf quit smoking 1980 allergies itching and sneezing took shots much improved when stopped shots and stayed  much better except some problem spring and fall and did ok until 40's then developed recurrent cough typically assoc with URI's and not with spring and fall  But much worse since  Early 2014 despite rx for  Asthma and gerd so referred 05/30/2013 to pulmonary clinic by Dr Lawernce Pitts  History of Present Illness  05/30/2013 1st [facility] office visit/ Wert cc daily cough x months which is so severe gets gagged and occ vomit and pos urinary incontinent.  Clear mucus esp p shower, no better with inhalers and acid suppression to date  rec  First take delsym two tsp every 12 hours and supplement if needed with  tramadol 50 mg up to 2 every 4 hours to suppress the urge to cough at all or even clear your throat. Swallowing water or using ice chips/non mint and menthol containing candies (such as lifesavers or sugarless jolly ranchers) are also effective.  You should rest your voice and avoid activities that you know make you cough. Once you have eliminated the cough for 3 straight days try reducing the tramadol first,  then the delsym as tolerated.   Try prilosec 20mg   Take x 2  30-60 min before first meal of the day and Pepcid 20 mg one bedtime  Prednisone 10 mg take  4 each am x 2 days,   2 each am x 2 days,  1 each am x 2 days and stop   GERD diet    06/27/2013 f/u ov/Wert re: 0 % better chronic cough x Jan 2014 / using saba for cough Chief Complaint  Patient presents with  . Follow-up    Pt states her SOB and wheezing are unchanged. Her cough is slightly improved since last visit. No new co's.    rec Please see patient coordinator before you leave today  to schedule sinus ct Only use your albuterol/proaire as a rescue medication  First take delsym two tsp every 12 hours  and supplement if needed with  Tylenol #3 one every 4 hours to suppress the urge to cough at all or even clear your throat.   Once you have eliminated the cough for 3 straight days try reducing the tylenol #3 ,  then the delsym as tolerated.  Keep flutter valve handy and use it when you start cough  Omeprazole 40 mg  Take x 2  30-60 min before first meal of the day and Pepcid 20 mg one bedtime and chlotrimeton 4 mg one at bedtime GERD diet   Please schedule a follow up office visit in 4 weeks, sooner if needed bring all medications with you when return.    07/25/2013 f/u ov/Wert re: cough x > one year / did not bring all meds "they are the same" Chief Complaint  Patient presents with  . Follow-up    Pt states her SOB and wheezing have improved some but her cough is some worse. Cough is prod with very minimal clear sputum.     >>prednisone taper , PPI /GERD, cough control rx   08/08/2013 Follow up and Med review  Patient returns for a followup and medication review. We reviewed all her medications and organized them into a medication calendar with patient education. Last visit. Patient was seen for  persistent cough. She was placed on aggressive reflux, and rhinitis prevention regimen. Along with cough control, which appears patient reports that she is improved. Her shortness, of breath and wheezing have resolved, and her dry cough has improved substantially.[place] and broke wrist last week requiring surgery.  rec No change rx  09/05/2013 f/u ov/Wert re: chronic cough  Chief Complaint  Patient presents with  . Follow-up    Pt states that her breathing is doing well. Her cough is unchanged since her last visit.     Worse cough is sometimes p shower but otherwise not really coughing much and no excess or bloody/purulent mucus.  Not limited by breathing from desired activities     Kouffman Reflux v Neurogenic Cough Differentiator Reflux Comments  Do you awaken from a sound sleep coughing  violently?                            With trouble breathing?  not now   Do you have choking episodes when you cannot  Get enough air, gasping for air ?              Much better   Do you usually cough when you lie down into  The bed, or when you just lie down to rest ?                          Recliner x 52m Due to back   Do you usually cough after meals or eating?         sometime   Do you cough when (or after) you bend over?    sometime   GERD SCORE     Kouffman Reflux v Neurogenic Cough Differentiator Neurogenic   Do you more-or-less cough all day long? Not now   Does change of temperature make you cough? yes   Does laughing or chuckling cause you to cough? yes   Do fumes (perfume, automobile fumes, burned  Toast, etc.,) cause you to cough ?      no   Does speaking, singing, or talking on the phone cause you to cough   ?               yes   Neurogenic/Airway score      No obvious other patterns in day to day or daytime variabilty or assoc chronic cough or cp or chest tightness, subjective wheeze overt sinus or hb symptoms. No unusual exp hx or h/o childhood pna/ asthma or knowledge of premature birth.  Sleeping ok without nocturnal  or early am exacerbation  of respiratory  c/o's or need for noct saba. Also denies any obvious fluctuation of symptoms with weather or environmental changes or other aggravating or alleviating factors except as outlined above   Current Medications, Allergies, Complete Past Medical History, Past Surgical History, Family History, and Social History were reviewed in [employer] record.  ROS  The following are not active complaints unless bolded sore throat, dysphagia, dental problems, itching, sneezing,  nasal congestion or excess/ purulent secretions, ear ache,   fever, chills, sweats, unintended wt loss, pleuritic or exertional cp, hemoptysis,  orthopnea pnd or leg swelling, presyncope, palpitations, heartburn, abdominal pain, anorexia,  nausea, vomiting, diarrhea  or change in bowel or urinary habits, change in stools or urine, dysuria,hematuria,  rash, arthralgias, visual complaints, headache, numbness weakness or ataxia or problems with walking or coordination,  change  in mood/affect or memory.                  Objective:   Physical Exam  amb wf nad    07/25/2013       197>>196 08/08/2013 > 09/05/2013 198        HEENT: nl dentition, turbinates, and orophanx. Nl external ear canals without cough reflex   NECK :  without JVD/Nodes/TM/ nl carotid upstrokes bilaterally   LUNGS: no acc muscle use, clear to A and P bilaterally without cough on insp or exp maneuvers   CV:  RRR  no s3 or murmur or increase in P2, no edema   ABD:  soft and nontender with nl excursion in the supine position. No bruits or organomegaly, bowel sounds nl  MS:  warm without deformities, calf tenderness, cyanosis or clubbing       cxr 02/23/13 R midlung atx or infiltrate also ? Lingular atx  CXR  06/27/2013 : Poorly defined density in the region of the right middle lobe further evaluation with contrasted chest CT recommended. Atelectasis versus infiltrate in region of the lingula.  CT chest c contrast 07/11/13 1. No acute findings in the thorax to account for the patient's  symptoms.  2. However, there is a subsolid nodule in the anterior aspect of the  right lower lobe that has a ground-glass attenuation component  measuring 2.5 x 1.8 cm, and a small central solid component  measuring 4 mm. Initial follow-up by chest CT without contrast is  recommended in 3 months to confirm persistence.          Assessment & Plan:

## 2013-09-07 NOTE — Assessment & Plan Note (Signed)
-   see CXR 06/27/13  - CT chest 07/14/2013 > 1. No acute findings in the thorax to account for the patient's  symptoms.  2. However, there is a subsolid nodule in the anterior aspect of the  right lower lobe that has a ground-glass attenuation component  measuring 2.5 x 1.8 cm, and a small central solid component  measuring 4 mm. Initial follow-up by chest CT without contrast is  recommended in 3 months to confirm persistence. - in tickle file for recall 10/09/13

## 2013-09-07 NOTE — Assessment & Plan Note (Signed)
-   Spirometry 02/23/13 wnl  - 07/03/2013 Sinus CT > Mild chronic sinus disease as described. No acute findings. Left-to-right nasal septal deviation of 3 mm. 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   These are the same pts (now being labeled as having "irritable larynx syndrome" by some cough centers) who not infrequently have a history of having failed to tolerate ace inhibitors,  dry powder inhalers or biphosphonates or report having atypical reflux symptoms that don't respond to standard doses of PPI , and are easily confused as having aecopd or asthma flares by even experienced allergists/ pulmonologists.  Next step is optimize rx with 1st gen H1 per guidlelines    Each maintenance medication was reviewed in detail including most importantly the difference between maintenance and as needed and under what circumstances the prns are to be used. This was done in the context of a medication calendar review which provided the patient with a user-friendly unambiguous mechanism for medication administration and reconciliation and provides an action plan for all active problems. It is critical that this be shown to every doctor  for modification during the office visit if necessary so the patient can use it as a working document.

## 2013-09-21 ENCOUNTER — Encounter: Payer: Self-pay | Admitting: Internal Medicine

## 2013-10-10 ENCOUNTER — Ambulatory Visit (INDEPENDENT_AMBULATORY_CARE_PROVIDER_SITE_OTHER)
Admission: RE | Admit: 2013-10-10 | Discharge: 2013-10-10 | Disposition: A | Discharge status: Home / Self Care | Payer: Medicare Other | Source: Ambulatory Visit | Attending: Internal Medicine | Admitting: Internal Medicine

## 2013-10-10 ENCOUNTER — Ambulatory Visit: Payer: Medicare Other | Admitting: Adult Health

## 2013-10-10 ENCOUNTER — Ambulatory Visit: Payer: Medicare Other | Admitting: Internal Medicine

## 2013-10-10 DIAGNOSIS — R918 Other nonspecific abnormal finding of lung field: Secondary | ICD-10-CM

## 2013-10-11 ENCOUNTER — Encounter: Payer: Self-pay | Admitting: Internal Medicine

## 2013-10-11 NOTE — Progress Notes (Signed)
Quick Note:  Spoke with pt and notified of results per Dr. Melvyn Novas. Pt verbalized understanding Had many questions  Already scheduled to see him this wk  She will keep ov to discuss  ______

## 2013-10-11 NOTE — Progress Notes (Signed)
Quick Note:  LMTCB ______ 

## 2013-10-13 ENCOUNTER — Ambulatory Visit (INDEPENDENT_AMBULATORY_CARE_PROVIDER_SITE_OTHER): Payer: Medicare Other | Admitting: Internal Medicine

## 2013-10-13 ENCOUNTER — Encounter: Payer: Self-pay | Admitting: Internal Medicine

## 2013-10-13 ENCOUNTER — Other Ambulatory Visit (INDEPENDENT_AMBULATORY_CARE_PROVIDER_SITE_OTHER): Payer: Medicare Other

## 2013-10-13 VITALS — BP 138/82 | HR 80 | Temp 98.7°F | Ht 64.0 in | Wt 205.0 lb

## 2013-10-13 DIAGNOSIS — R059 Cough, unspecified: Secondary | ICD-10-CM

## 2013-10-13 DIAGNOSIS — R05 Cough: Secondary | ICD-10-CM

## 2013-10-13 DIAGNOSIS — R918 Other nonspecific abnormal finding of lung field: Secondary | ICD-10-CM

## 2013-10-13 LAB — CBC WITH DIFFERENTIAL/PLATELET
Basophils Absolute: 0 10*3/uL (ref 0.0–0.1)
Basophils Relative: 0.4 % (ref 0.0–3.0)
Eosinophils Absolute: 0.3 10*3/uL (ref 0.0–0.7)
Eosinophils Relative: 4.1 % (ref 0.0–5.0)
HCT: 35.9 % — ABNORMAL LOW (ref 36.0–46.0)
Hemoglobin: 12 g/dL (ref 12.0–15.0)
Lymphocytes Relative: 24 % (ref 12.0–46.0)
Lymphs Abs: 1.7 10*3/uL (ref 0.7–4.0)
MCHC: 33.5 g/dL (ref 30.0–36.0)
MCV: 92.1 fl (ref 78.0–100.0)
Monocytes Absolute: 0.7 10*3/uL (ref 0.1–1.0)
Monocytes Relative: 9.2 % (ref 3.0–12.0)
Neutro Abs: 4.5 10*3/uL (ref 1.4–7.7)
Neutrophils Relative %: 62.3 % (ref 43.0–77.0)
Platelets: 370 10*3/uL (ref 150.0–400.0)
RBC: 3.9 Mil/uL (ref 3.87–5.11)
RDW: 14.4 % (ref 11.5–14.6)
WBC: 7.3 10*3/uL (ref 4.5–10.5)

## 2013-10-13 LAB — BASIC METABOLIC PANEL
BUN: 17 mg/dL (ref 6–23)
CO2: 28 mEq/L (ref 19–32)
Calcium: 10.2 mg/dL (ref 8.4–10.5)
Chloride: 101 mEq/L (ref 96–112)
Creatinine, Ser: 1.1 mg/dL (ref 0.4–1.2)
GFR: 50.13 mL/min — ABNORMAL LOW (ref >60.00)
Glucose, Bld: 95 mg/dL (ref 70–99)
Potassium: 4.1 mEq/L (ref 3.5–5.1)
Sodium: 136 mEq/L (ref 135–145)

## 2013-10-13 LAB — SEDIMENTATION RATE: Sed Rate: 34 mm/hr — ABNORMAL HIGH (ref 0–22)

## 2013-10-13 MED ORDER — METHYLPREDNISOLONE ACETATE 80 MG/ML IJ SUSP
120.0000 mg | Freq: Once | INTRAMUSCULAR | Status: AC
  Administered 2013-10-13: 120 mg via INTRAMUSCULAR

## 2013-10-13 MED ORDER — MONTELUKAST SODIUM 10 MG PO TABS: ORAL_TABLET | ORAL | Status: DC

## 2013-10-13 NOTE — Patient Instructions (Addendum)
Depomedrol 120 mg IM   singulair 10 mg one daily in evening   Please schedule a follow up visit in 4 weeks with CXR

## 2013-10-13 NOTE — Progress Notes (Signed)
Subjective:    Patient ID: [name], female    DOB: 03-18-1941  MRN: [id number]   Brief patient profile:  19 yowf quit smoking 1980 allergies itching and sneezing took shots much improved when stopped shots and stayed  much better except some problem spring and fall and did ok until 40's then developed recurrent cough typically assoc with URI's and not with spring and fall  But much worse since  Early 2014 despite rx for  Asthma and gerd so referred 05/30/2013 to pulmonary clinic by Dr Lawernce Pitts  History of Present Illness  05/30/2013 1st [facility] office visit/ Macguire Holsinger cc daily cough x months which is so severe gets gagged and occ vomit and pos urinary incontinent.  Clear mucus esp p shower, no better with inhalers and acid suppression to date  rec  First take delsym two tsp every 12 hours and supplement if needed with  tramadol 50 mg up to 2 every 4 hours to suppress the urge to cough at all or even clear your throat. Swallowing water or using ice chips/non mint and menthol containing candies (such as lifesavers or sugarless jolly ranchers) are also effective.  You should rest your voice and avoid activities that you know make you cough. Once you have eliminated the cough for 3 straight days try reducing the tramadol first,  then the delsym as tolerated.   Try prilosec 20mg   Take x 2  30-60 min before first meal of the day and Pepcid 20 mg one bedtime  Prednisone 10 mg take  4 each am x 2 days,   2 each am x 2 days,  1 each am x 2 days and stop   GERD diet    06/27/2013 f/u ov/[name] re: 0 % better chronic cough x Jan 2014 / using saba for cough Chief Complaint  Patient presents with  . Follow-up    Pt states her SOB and wheezing are unchanged. Her cough is slightly improved since last visit. No new co's.    rec  schedule sinus ct> mild chronic changes only  Only use your albuterol/proaire as a rescue medication  First take delsym two tsp every 12 hours and supplement if needed  with  Tylenol #3 one every 4 hours to suppress the urge to cough at all or even clear your throat.   Once you have eliminated the cough for 3 straight days try reducing the tylenol #3 ,  then the delsym as tolerated.  Keep flutter valve handy and use it when you start cough  Omeprazole 40 mg  Take x 2  30-60 min before first meal of the day and Pepcid 20 mg one bedtime and chlotrimeton 4 mg one at bedtime GERD diet   Please schedule a follow up office visit in 4 weeks, sooner if needed bring all medications with you when return.    07/25/2013 f/u ov/[name] re: cough x > one year / did not bring all meds "they are the same" Chief Complaint  Patient presents with  . Follow-up    Pt states her SOB and wheezing have improved some but her cough is some worse. Cough is prod with very minimal clear sputum.     >>prednisone taper , PPI /GERD, cough control rx   08/08/2013 Follow up and Med review  Patient returns for a followup and medication review. We reviewed all her medications and organized them into a medication calendar with patient education. Last visit. Patient was seen for persistent cough.  She was placed on aggressive reflux, and rhinitis prevention regimen. Along with cough control, which appears patient reports that she is improved. Her shortness, of breath and wheezing have resolved, and her dry cough has improved substantially.[place] [address] and broke wrist last week requiring surgery.  rec No change rx  09/05/2013 f/u ov/[name] re: chronic cough  Chief Complaint  Patient presents with  . Follow-up    Pt states that her breathing is doing well. Her cough is unchanged since her last visit.   Worse cough is sometimes p shower but otherwise not really coughing much vs baseline  and no excess or bloody/purulent mucus.  Not limited by breathing from desired activities   rec 1st gen H1   10/13/2013 f/u ov/[name] re: cough x one year, worse p shower then early evening, h1 not helping  Chief Complaint   Patient presents with  . Follow-up    Review CT from earlier this week.  C/o sinus congestion, PND, nonprod cough X3 weeks.        questionaire (unchanged pattern from previous ov)   Kouffman Reflux v Neurogenic Cough Differentiator Reflux Comments  Do you awaken from a sound sleep coughing violently?                            With trouble breathing?  not now   Do you have choking episodes when you cannot  Get enough air, gasping for air ?              Much better   Do you usually cough when you lie down into  The bed, or when you just lie down to rest ?                          Recliner x 39m Due to back   Do you usually cough after meals or eating?         sometime   Do you cough when (or after) you bend over?    sometime   GERD SCORE     Kouffman Reflux v Neurogenic Cough Differentiator Neurogenic   Do you more-or-less cough all day long? Not now   Does change of temperature make you cough? yes   Does laughing or chuckling cause you to cough? yes   Do fumes (perfume, automobile fumes, burned  Toast, etc.,) cause you to cough ?      no   Does speaking, singing, or talking on the phone cause you to cough   ?               yes   Neurogenic/Airway score      No obvious other patterns in day to day or daytime variabilty or assoc sob  cp or chest tightness, subjective wheeze overt sinus or hb symptoms. No unusual exp hx or h/o childhood pna/ asthma or knowledge of premature birth.  Sleeping ok without nocturnal  or early am exacerbation  of respiratory  c/o's or need for noct saba. Also denies any obvious fluctuation of symptoms with weather or environmental changes or other aggravating or alleviating factors except as outlined above   Current Medications, Allergies, Complete Past Medical History, Past Surgical History, Family History, and Social History were reviewed in [employer] record.  ROS  The following are not active complaints unless bolded sore  throat, dysphagia, dental problems, itching, sneezing,  nasal congestion or excess/ purulent secretions,  ear ache,   fever, chills, sweats, unintended wt loss, pleuritic or exertional cp, hemoptysis,  orthopnea pnd or leg swelling, presyncope, palpitations, heartburn, abdominal pain, anorexia, nausea, vomiting, diarrhea  or change in bowel or urinary habits, change in stools or urine, dysuria,hematuria,  rash, arthralgias, visual complaints, headache, numbness weakness or ataxia or problems with walking or coordination,  change in mood/affect or memory.                  Objective:   Physical Exam  amb wf nad    07/25/2013       197>>196 08/08/2013 > 09/05/2013 198 > 10/13/2013  205        HEENT: nl dentition, turbinates, and orophanx. Nl external ear canals without cough reflex   NECK :  without JVD/Nodes/TM/ nl carotid upstrokes bilaterally   LUNGS: no acc muscle use, clear to A and P bilaterally without cough on insp or exp maneuvers   CV:  RRR  no s3 or murmur or increase in P2, no edema   ABD:  soft and nontender with nl excursion in the supine position. No bruits or organomegaly, bowel sounds nl  MS:  warm without deformities, calf tenderness, cyanosis or clubbing       cxr 02/23/13 report R midlung atx or infiltrate also ? Lingular atx  CXR  06/27/2013 : Poorly defined density in the region of the right middle lobe further evaluation with contrasted chest CT recommended. Atelectasis versus infiltrate in region of the lingula.  CT chest c contrast 07/11/13 1. No acute findings in the thorax to account for the patient's  symptoms.  2. However, there is a subsolid nodule in the anterior aspect of the  right lower lobe that has a ground-glass attenuation component  measuring 2.5 x 1.8 cm, and a small central solid component  measuring 4 mm. Initial follow-up by chest CT without contrast is  recommended in 3 months to confirm persistence.  10/11/13 CT 1. Dominant  ground-glass attenuation nodule in the superior segment  of the right lower lobe shows stable size of ground-glass  attenuation portion with slight increase in size of the solid  component. Given the increase in the size of the solid component,  another 3 month followup non contrast chest CT is recommended to  evaluate for interval change. Although biopsy could be considered,  because of the small size of the solid component targeting would be  difficult.  2. Multiple other pulmonary nodules under 8 mm appear similar to  prior.            Assessment & Plan:

## 2013-10-14 NOTE — Assessment & Plan Note (Signed)
-   Spirometry 02/23/13 wnl  - 07/03/2013 Sinus CT > Mild chronic sinus disease as described. No acute findings. Left-to-right nasal septal deviation of 3 mm. -med calendar 08/08/13  - Eos 4%   10/13/2013  > trial of singulair daily as not clear whether this is related to rhinitis or cough variant asthma, both of which potentially could be singulair responsive and the only way to know is a one month trial then regroup  In meantime,   Each maintenance medication was reviewed in detail including most importantly the difference between maintenance and as needed and under what circumstances the prns are to be used. This was done in the context of a medication calendar review which provided the patient with a user-friendly unambiguous mechanism for medication administration and reconciliation and provides an action plan for all active problems. It is critical that this be shown to every doctor  for modification during the office visit if necessary so the patient can use it as a working document.

## 2013-10-14 NOTE — Assessment & Plan Note (Addendum)
-   see CXR 06/27/13  - CT chest 07/14/2013 > 1. No acute findings in the thorax to account for the patient's  symptoms.  2. However, there is a subsolid nodule in the anterior aspect of the  right lower lobe that has a ground-glass attenuation component  measuring 2.5 x 1.8 cm, and a small central solid component  measuring 4 mm. Initial follow-up by chest CT without contrast is  recommended in 3 months to confirm persistence. CT chest 10/10/13  1. Dominant ground-glass attenuation nodule in the superior segment of the right lower lobe shows stable size of ground-glass attenuation portion with slight increase in size of the solid component. Given the increase in the size of the solid component, another 3 month followup non contrast chest CT is recommended to evaluate for interval change. Although biopsy could be considered, because of the small size of the solid component targeting would be difficult. 2. Multiple other pulmonary nodules under 8 mm appear similar to Prior.   Most likely this is a benign process with MPN's suggestive of low grade MAI though haven't excluded bronchogenic ca which remains in ddx.  The solid components of these lesions do not measure > 10 mm and so PET scan has a low neg pred value (assuming the scan is neg) and porr pos pred value (due to ddx being MAI) if positive so the only alternatives are excisional bx vs f/u in 3 m as per radiology recs  Discussed in detail all the  indications, usual  risks and alternatives  relative to the benefits with patient who agrees to proceed with conservative f/u in 4 weeks and see what we can do about the cough, which is likely unrelated, in meantime

## 2013-10-16 NOTE — Progress Notes (Signed)
Quick Note:  Spoke with pt and notified of results per Dr. Wert. Pt verbalized understanding and denied any questions.  ______ 

## 2013-11-10 ENCOUNTER — Ambulatory Visit (INDEPENDENT_AMBULATORY_CARE_PROVIDER_SITE_OTHER): Payer: Medicare Other | Admitting: Internal Medicine

## 2013-11-10 ENCOUNTER — Encounter: Payer: Self-pay | Admitting: Internal Medicine

## 2013-11-10 VITALS — BP 120/72 | HR 75 | Temp 98.1°F | Ht 64.0 in | Wt 207.8 lb

## 2013-11-10 DIAGNOSIS — R918 Other nonspecific abnormal finding of lung field: Secondary | ICD-10-CM

## 2013-11-10 DIAGNOSIS — R05 Cough: Secondary | ICD-10-CM

## 2013-11-10 DIAGNOSIS — R059 Cough, unspecified: Secondary | ICD-10-CM

## 2013-11-10 NOTE — Patient Instructions (Addendum)
See calendar for specific medication instructions and bring it back for each and every office visit for every healthcare provider you see.  Without it,  you may not receive the best quality medical care that we feel you deserve.  You will note that the calendar groups together  your maintenance  medications that are timed at particular times of the day.  Think of this as your checklist for what your doctor has instructed you to do until your next evaluation to see what benefit  there is  to staying on a consistent group of medications intended to keep you well.  The other group at the bottom is entirely up to you to use as you see fit  for specific symptoms that may arise between visits that require you to treat them on an as needed basis.  Think of this as your action plan or "what if" list.   Separating the top medications from the bottom group is fundamental to providing you adequate care going forward.    We will call you for follow up CT in first week in July.

## 2013-11-10 NOTE — Progress Notes (Signed)
Subjective:    Patient ID: [name], female    DOB: 1940/10/14  MRN: [id number]   Brief patient profile:  72 yowf quit smoking 1980 allergies itching and sneezing took shots much improved when stopped shots and stayed  much better except some problem spring and fall and did ok until 40's then developed recurrent cough typically assoc with URI's and not with spring and fall  But much worse since  Early 2014 despite rx for  Asthma and gerd so referred 05/30/2013 to pulmonary clinic by Dr Lawernce Pitts  History of Present Illness  05/30/2013 1st [facility] office visit/ [name] cc daily cough x months which is so severe gets gagged and occ vomit and pos urinary incontinent.  Clear mucus esp p shower, no better with inhalers and acid suppression to date  rec  First take delsym two tsp every 12 hours and supplement if needed with  tramadol 50 mg up to 2 every 4 hours to suppress the urge to cough at all or even clear your throat. . Once you have eliminated the cough for 3 straight days try reducing the tramadol first,  then the delsym as tolerated.   Try prilosec 20mg   Take x 2  30-60 min before first meal of the day and Pepcid 20 mg one bedtime  Prednisone 10 mg take  4 each am x 2 days,   2 each am x 2 days,  1 each am x 2 days and stop   GERD diet    06/27/2013 f/u ov/[name] re: 0 % better chronic cough x Jan 2014 / using saba for cough Chief Complaint  Patient presents with  . Follow-up    Pt states her SOB and wheezing are unchanged. Her cough is slightly improved since last visit. No new co's.    rec  schedule sinus ct> mild chronic changes only  Only use your albuterol/proaire as a rescue medication  First take delsym two tsp every 12 hours and supplement if needed with  Tylenol #3 one every 4 hours to suppress the urge to cough at all or even clear your throat.   Once you have eliminated the cough for 3 straight days try reducing the tylenol #3 ,  then the delsym as tolerated.   Keep flutter valve handy and use it when you start cough  Omeprazole 40 mg  Take x 2  30-60 min before first meal of the day and Pepcid 20 mg one bedtime and chlotrimeton 4 mg one at bedtime GERD diet   Please schedule a follow up office visit in 4 weeks, sooner if needed bring all medications with you when return.    07/25/2013 f/u ov/[name] re: cough x > one year / did not bring all meds "they are the same" Chief Complaint  Patient presents with  . Follow-up    Pt states her SOB and wheezing have improved some but her cough is some worse. Cough is prod with very minimal clear sputum.     >>prednisone taper , PPI /GERD, cough control rx   08/08/2013 Follow up and Med review  Patient returns for a followup and medication review. We reviewed all her medications and organized them into a medication calendar with patient education. Last visit. Patient was seen for persistent cough. She was placed on aggressive reflux, and rhinitis prevention regimen. Along with cough control, which appears patient reports that she is improved. Her shortness, of breath and wheezing have resolved, and her dry cough has  improved substantially.[place] [address] and broke wrist last week requiring surgery.  rec No change rx  09/05/2013 f/u ov/[name] re: chronic cough  Chief Complaint  Patient presents with  . Follow-up    Pt states that her breathing is doing well. Her cough is unchanged since her last visit.   Worse cough is sometimes p shower but otherwise not really coughing much vs baseline  and no excess or bloody/purulent mucus.  Not limited by breathing from desired activities   rec 1st gen H1   10/13/2013 f/u ov/[name] re: cough x one year, worse p shower then early evening, h1 not helping  Chief Complaint  Patient presents with  . Follow-up    Review CT from earlier this week.  C/o sinus congestion, PND, nonprod cough X3 weeks.      questionaire (unchanged pattern from previous ov)   Kouffman Reflux v Neurogenic Cough  Differentiator Reflux Comments  Do you awaken from a sound sleep coughing violently?                            With trouble breathing?  not now   Do you have choking episodes when you cannot  Get enough air, gasping for air ?              Much better   Do you usually cough when you lie down into  The bed, or when you just lie down to rest ?                          Recliner x 65m Due to back   Do you usually cough after meals or eating?         sometime   Do you cough when (or after) you bend over?    sometime   GERD SCORE     Kouffman Reflux v Neurogenic Cough Differentiator Neurogenic   Do you more-or-less cough all day long? Not now   Does change of temperature make you cough? yes   Does laughing or chuckling cause you to cough? yes   Do fumes (perfume, automobile fumes, burned  Toast, etc.,) cause you to cough ?      no   Does speaking, singing, or talking on the phone cause you to cough   ?               yes   Neurogenic/Airway score    rec Depomedrol 120 mg IM  singulair 10 mg one daily in evening    11/10/2013 f/u ov/[name] re: chronic cough / MPN's Chief Complaint  Patient presents with  . Follow-up    Pt states that her cough is unchanged since her last visit. No new co's today.   no noct cough at all, p stirring x 5 min then cough > dry but this is really minimal compared to previous Never produces any mucus   No obvious other patterns in day to day or daytime variabilty or assoc sob  cp or chest tightness, subjective wheeze overt sinus or hb symptoms. No unusual exp hx or h/o childhood pna/ asthma or knowledge of premature birth.  Sleeping ok without nocturnal  or early am exacerbation  of respiratory  c/o's or need for noct saba. Also denies any obvious fluctuation of symptoms with weather or environmental changes or other aggravating or alleviating factors except as outlined above   Current Medications, Allergies, Complete Past Medical History,  Past Surgical History,  Family History, and Social History were reviewed in [employer] record.  ROS  The following are not active complaints unless bolded sore throat, dysphagia, dental problems, itching, sneezing,  nasal congestion or excess/ purulent secretions, ear ache,   fever, chills, sweats, unintended wt loss, pleuritic or exertional cp, hemoptysis,  orthopnea pnd or leg swelling, presyncope, palpitations, heartburn, abdominal pain, anorexia, nausea, vomiting, diarrhea  or change in bowel or urinary habits, change in stools or urine, dysuria,hematuria,  rash, arthralgias, visual complaints, headache, numbness weakness or ataxia or problems with walking or coordination,  change in mood/affect or memory.                  Objective:   Physical Exam  amb wf nad  With occ throat clearing   07/25/2013       197>>196 08/08/2013 > 09/05/2013 198 > 10/13/2013  205 > 11/10/2013  208        HEENT: nl dentition, turbinates, and orophanx. Nl external ear canals without cough reflex   NECK :  without JVD/Nodes/TM/ nl carotid upstrokes bilaterally   LUNGS: no acc muscle use, clear to A and P bilaterally without cough on insp or exp maneuvers   CV:  RRR  no s3 or murmur or increase in P2, no edema   ABD:  soft and nontender with nl excursion in the supine position. No bruits or organomegaly, bowel sounds nl  MS:  warm without deformities, calf tenderness, cyanosis or clubbing       CT chest c contrast 07/11/13 1. No acute findings in the thorax to account for the patient's  symptoms.  2. However, there is a subsolid nodule in the anterior aspect of the  right lower lobe that has a ground-glass attenuation component  measuring 2.5 x 1.8 cm, and a small central solid component  measuring 4 mm. Initial follow-up by chest CT without contrast is  recommended in 3 months to confirm persistence.  10/11/13 CT 1. Dominant ground-glass attenuation nodule in the superior segment  of the right  lower lobe shows stable size of ground-glass  attenuation portion with slight increase in size of the solid  component. Given the increase in the size of the solid component,  another 3 month followup non contrast chest CT is recommended to  evaluate for interval change. Although biopsy could be considered,  because of the small size of the solid component targeting would be  difficult.  2. Multiple other pulmonary nodules under 8 mm appear similar to  prior.            Assessment & Plan:

## 2013-11-11 NOTE — Assessment & Plan Note (Signed)
-   see CXR 06/27/13  - CT chest 07/14/2013 > 1. No acute findings in the thorax to account for the patient's  symptoms.  2. However, there is a subsolid nodule in the anterior aspect of the  right lower lobe that has a ground-glass attenuation component  measuring 2.5 x 1.8 cm, and a small central solid component  measuring 4 mm. Initial follow-up by chest CT without contrast is  recommended in 3 months to confirm persistence. CT chest 10/10/13  1. Dominant ground-glass attenuation nodule in the superior segment of the right lower lobe shows stable size of ground-glass attenuation portion with slight increase in size of the solid component. Given the increase in the size of the solid component, another 3 month followup non contrast chest CT is recommended to evaluate for interval change. Although biopsy could be considered, because of the small size of the solid component targeting would be difficult. 2. Multiple other pulmonary nodules under 8 mm appear similar to Prior. - CT chest in tickle file for 01/09/14   Discussed in detail all the  indications, usual  risks and alternatives  relative to the benefits with patient who agrees to proceed with conservative f/u with serial limited non-contrast CT as rec by radiology

## 2013-11-11 NOTE — Assessment & Plan Note (Signed)
-   Spirometry 02/23/13 wnl  - 07/03/2013 Sinus CT > Mild chronic sinus disease as described. No acute findings. Left-to-right nasal septal deviation of 3 mm. -med calendar 08/08/13  - Eos 4%   10/13/2013  > trial of singulair daily > seems overall better so rec no change rx    Each maintenance medication was reviewed in detail including most importantly the difference between maintenance and as needed and under what circumstances the prns are to be used. This was done in the context of a medication calendar review which provided the patient with a user-friendly unambiguous mechanism for medication administration and reconciliation and provides an action plan for all active problems. It is critical that this be shown to every doctor  for modification during the office visit if necessary so the patient can use it as a working document.

## 2013-12-28 ENCOUNTER — Telehealth: Payer: Self-pay | Admitting: *Deleted

## 2013-12-28 DIAGNOSIS — R918 Other nonspecific abnormal finding of lung field: Secondary | ICD-10-CM

## 2013-12-28 NOTE — Telephone Encounter (Signed)
Message copied by Rosana Berger on Thu Dec 28, 2013  3:49 PM ------      Message from: Tanda Rockers      Created: Fri Nov 10, 2013  3:23 PM       CT limited no contrast follow up nodule ------

## 2014-01-03 ENCOUNTER — Telehealth: Payer: Self-pay | Admitting: Internal Medicine

## 2014-01-03 MED ORDER — ALBUTEROL SULFATE HFA 108 (90 BASE) MCG/ACT IN AERS: 2.0000 | INHALATION_SPRAY | Freq: Four times a day (QID) | RESPIRATORY_TRACT | Status: DC | PRN

## 2014-01-03 NOTE — Telephone Encounter (Signed)
Called and spoke to pt. Pt requesting refill for Proair called into [facility] on [address] in [place]. Rx filled with 4 refills, pt aware. Pt denied any further questions or concerns at this time.

## 2014-01-09 ENCOUNTER — Other Ambulatory Visit: Payer: Self-pay | Admitting: Internal Medicine

## 2014-01-09 ENCOUNTER — Encounter: Payer: Self-pay | Admitting: Internal Medicine

## 2014-01-09 ENCOUNTER — Ambulatory Visit (INDEPENDENT_AMBULATORY_CARE_PROVIDER_SITE_OTHER)
Admission: RE | Admit: 2014-01-09 | Discharge: 2014-01-09 | Disposition: A | Discharge status: Home / Self Care | Payer: Medicare Other | Source: Ambulatory Visit | Attending: Internal Medicine | Admitting: Internal Medicine

## 2014-01-09 DIAGNOSIS — R918 Other nonspecific abnormal finding of lung field: Secondary | ICD-10-CM

## 2014-01-09 NOTE — Progress Notes (Signed)
Quick Note:  Order was sent to [facility] ______

## 2014-01-12 ENCOUNTER — Encounter: Payer: Medicare Other | Admitting: Thoracic Surgery (Cardiothoracic Vascular Surgery)

## 2014-01-30 ENCOUNTER — Institutional Professional Consult (permissible substitution) (INDEPENDENT_AMBULATORY_CARE_PROVIDER_SITE_OTHER): Payer: Medicare Other | Admitting: Thoracic Surgery (Cardiothoracic Vascular Surgery)

## 2014-01-30 ENCOUNTER — Other Ambulatory Visit: Payer: Self-pay | Admitting: *Deleted

## 2014-01-30 ENCOUNTER — Encounter: Payer: Self-pay | Admitting: Thoracic Surgery (Cardiothoracic Vascular Surgery)

## 2014-01-30 VITALS — BP 168/86 | HR 73 | Resp 20 | Ht 64.0 in | Wt 199.0 lb

## 2014-01-30 DIAGNOSIS — R911 Solitary pulmonary nodule: Secondary | ICD-10-CM

## 2014-01-30 DIAGNOSIS — J45909 Unspecified asthma, uncomplicated: Secondary | ICD-10-CM | POA: Insufficient documentation

## 2014-01-30 DIAGNOSIS — J4530 Mild persistent asthma, uncomplicated: Secondary | ICD-10-CM

## 2014-01-30 DIAGNOSIS — R918 Other nonspecific abnormal finding of lung field: Secondary | ICD-10-CM

## 2014-01-30 NOTE — Progress Notes (Signed)
PCP is Gennette Pac, MD Referring Provider is Tanda Rockers, MD  Chief Complaint  Patient presents with  . Thoracic Aortic Aneurysm    Surgical eval, Chest CT 01/09/14    HPI:   Past Medical History  Diagnosis Date  . Osteopenia   . Hypertriglyceridemia   . Depression with anxiety   . GERD (gastroesophageal reflux disease)   . OA (osteoarthritis)   . Colon polyp   . Atrophic vaginitis   . Kidney stones   . Contact lens/glasses fitting     wears contacts or glasses  . Asthma     Past Surgical History  Procedure Laterality Date  . Partial hysterectomy    . Appendectomy    . Colonoscopy    . Orif wrist fracture Left 08/04/2013    Procedure: OPEN REDUCTION INTERNAL FIXATION (ORIF) LEFT DISTAL RADIUS WRIST FRACTURE;  Surgeon: Wynonia Sours, MD;  Location: [facility];  Service: Orthopedics;  Laterality: Left;    Family History  Problem Relation Age of Onset  . Stroke Father   . Lung cancer Sister     smoked  . Breast cancer Daughter 31  . Asthma Father   . Heart disease Father     CABG x 2    Social History History  Substance Use Topics  . Smoking status: Former Smoker -- 0.75 packs/day for 20 years    Types: Cigarettes    Quit date: 07/06/1978  . Smokeless tobacco: Never Used  . Alcohol Use: No    Current Outpatient Prescriptions  Medication Sig Dispense Refill  . albuterol (PROAIR HFA) 108 (90 BASE) MCG/ACT inhaler Inhale 2 puffs into the lungs every 6 (six) hours as needed for wheezing or shortness of breath.  1 Inhaler  4  . Calcium-Magnesium-Vitamin D (CALCIUM 1200+D3 PO) Take 1 tablet by mouth at bedtime.      . chlorpheniramine (CHLOR-TRIMETON) 4 MG tablet Take 4 mg by mouth at bedtime.      Marland Kitchen dextromethorphan (DELSYM) 30 MG/5ML liquid Take 60 mg by mouth 2 (two) times daily as needed for cough.      . famotidine (PEPCID) 20 MG tablet 20 mg at bedtime. One at bedtime      . fenofibrate 160 MG tablet Take 160 mg by mouth daily.       Marland Kitchen FLUoxetine (PROZAC) 20 MG capsule Take 40 mg by mouth daily.       . montelukast (SINGULAIR) 10 MG tablet One at bedtime every night  30 tablet  2  . Multiple Vitamins-Minerals (CENTRUM SILVER PO) Take 1 tablet by mouth daily.      Marland Kitchen omeprazole (PRILOSEC) 20 MG capsule Take 40 mg by mouth daily.       Marland Kitchen Respiratory Therapy Supplies (FLUTTER) DEVI Use as directed  1 each  0  . solifenacin (VESICARE) 10 MG tablet Take 10 mg by mouth daily.      . traZODone (DESYREL) 50 MG tablet Take 50 mg by mouth at bedtime.       No current facility-administered medications for this visit.    Allergies  Allergen Reactions  . Nsaids     Elevated kidney fx  . Prednisone     Nausea     Review of Systems  Constitutional: Positive for unexpected weight change (gained 19 pounds in 6 months). Negative for fever, chills and activity change.  Respiratory: Positive for cough and wheezing.   Cardiovascular: Negative for chest pain.  Gastrointestinal:  Reflux, hiatal hermia  Musculoskeletal: Positive for arthralgias.  Hematological: Negative for adenopathy. Does not bruise/bleed easily.  All other systems reviewed and are negative.   BP 168/86  Pulse 73  Resp 20  Ht 5\' 4"  (1.626 m)  Wt 199 lb (90.266 kg)  BMI 34.14 kg/m2  SpO2 98% Physical Exam  Vitals reviewed. Constitutional: She is oriented to person, place, and time. She appears well-developed and well-nourished. No distress.  HENT:  Head: Normocephalic and atraumatic.  Eyes: EOM are normal. Pupils are equal, round, and reactive to light.  Neck: Neck supple. No thyromegaly present.  Cardiovascular: Normal rate, regular rhythm, normal heart sounds and intact distal pulses.  Exam reveals no gallop and no friction rub.   No murmur heard. Pulmonary/Chest: Effort normal and breath sounds normal. She has no wheezes. She has no rales.  Abdominal: Soft. There is no tenderness.  Musculoskeletal: She exhibits no edema.  Lymphadenopathy:     She has no cervical adenopathy.  Neurological: She is alert and oriented to person, place, and time. No cranial nerve deficit.  Skin: Skin is warm and dry.  Psychiatric: She has a normal mood and affect.     Diagnostic Tests: CT CHEST WITHOUT CONTRAST 01/09/2014 TECHNIQUE:  Multidetector CT imaging of the chest was performed following the  standard protocol without IV contrast.  COMPARISON: Chest CTs dated 10/10/2013 and 07/14/2013. Abdominal CT  03/21/2008.  FINDINGS:  The mediastinum has a stable appearance without evidence of  adenopathy. There is stable mild atherosclerosis of the aorta and  coronary arteries. Mitral annular calcifications are noted.  There is stable nodularity of the thyroid gland and a stable hiatal  hernia. No significant pleural or pericardial effusion is present.  There is a stable soft tissue nodule superiorly in the right breast  on image 27.  Again demonstrated are numerous small pulmonary nodules and  scattered ground-glass densities. Dominant sub solid nodule in the  superior segment of the right lower lobe measures 2.2 x 1.5 cm on  image 27. This has a central solid component measuring at least 14  mm on sagittal image 25, suspicious for adenocarcinoma. No new or  enlarging nodules are identified.  The visualized upper abdomen has a stable appearance. There is no  evidence of adrenal mass or suspicious osseous finding.  IMPRESSION:  1. Persistent sub solid nodule in the superior segment of the right  lower lobe. Adenocarcinoma is considered. Thoracic surgery  consultation is recommended. PET/CT should be considered for staging  purposes.These recommendations are taken from "Recommendations for  the Management of Subsolid Pulmonary Nodules Detected at CT: A  Statement from the [place]" Radiology 2013; 266:1,  304-317.  2. Additional smaller ground-glass densities and pulmonary nodules  bilaterally are unchanged, nonspecific.  3. No  evidence of adenopathy or pleural effusion.  Electronically Signed  By: Camie Patience M.D.  On: 01/09/2014 13:37    Impression: 73 year old woman with persistent cough who has multiple lung nodules and scattered groundglass densities. She has a dominant subsolid nodule in the superior segment of the right lower lobe that is 2.2 x 1.5 cm. There is a central solid component to this lesion. This is highly suspicious for a low-grade adenocarcinoma, and needs to be considered such unless it can be proven otherwise. It could also be an infectious or inflammatory nodule such as BOOP or granulomatous disease.  I had a long discussion with Mrs. Scurlock and her family. We reviewed the CT scans. We discussed our options for  diagnosis. While it would be possible to obtain a biopsy percutaneously or bronchoscopically, neither of those approaches were definitively rule out cancer as they are subject to a significant chance of false negative results. In my opinion the best option is wedge resection for definitive diagnosis.  Given that she has multiple groundglass opacities and multiple small nodules in addition to the dominant mixed nodule I think a PET scan would be appropriate prior to surgery to rule out any other evidence of disease elsewhere and also to help assess whether other areas need to be addressed at the time of surgery.  I discussed the proposed operation with the patient and her family. Pending the PET results, the current recommendation would be to proceed with a right VATS, wedge resection and then possible segmentectomy or lobectomy depending on results of frozen section. That plan to change significantly if there are other areas that are hypermetabolic on PET. I did discuss with them the general nature of the procedure including the need for general anesthesia, incisions be used, expected hospital stay, and overall recovery. We did discuss the possibility of cryo-analgesia for pain control, and she  was in favor of that. We discussed the indications, risks, benefits, and alternatives. She understands that the risks include, but are not limited to death, MI, DVT, PE, bleeding, possible need for transfusion, infection, cardiac arrhythmias, prolonged air leaks, as well as the possibility of unforeseeable complications.  We did set a candidate for August 10 pending the results of the PET CT. I will meet with her after the PET CT to discuss the results and formalize a final operative plan.  Plan: 1. PET/CT  2. Return to discuss results of PET and finalize surgical plan  3. Right VATS, wedge resection, possible segmentectomy or lobectomy, cryo- analgesia of intercostal nerves on Monday, August 10.

## 2014-01-31 ENCOUNTER — Encounter (HOSPITAL_COMMUNITY): Payer: Self-pay | Admitting: Pharmacy Technician

## 2014-02-03 ENCOUNTER — Encounter (HOSPITAL_COMMUNITY)
Admission: RE | Admit: 2014-02-03 | Discharge: 2014-02-03 | Disposition: A | Discharge status: Home / Self Care | Payer: Medicare Other | Source: Ambulatory Visit | Attending: Thoracic Surgery (Cardiothoracic Vascular Surgery) | Admitting: Thoracic Surgery (Cardiothoracic Vascular Surgery)

## 2014-02-03 DIAGNOSIS — R918 Other nonspecific abnormal finding of lung field: Secondary | ICD-10-CM

## 2014-02-03 LAB — GLUCOSE, CAPILLARY: Glucose-Capillary: 95 mg/dL (ref 70–99)

## 2014-02-03 MED ORDER — FLUDEOXYGLUCOSE F - 18 (FDG) INJECTION
10.9000 | Freq: Once | INTRAVENOUS | Status: AC | PRN
  Administered 2014-02-03: 10.9 via INTRAVENOUS

## 2014-02-05 ENCOUNTER — Other Ambulatory Visit: Payer: Self-pay | Admitting: Internal Medicine

## 2014-02-06 ENCOUNTER — Other Ambulatory Visit: Payer: Self-pay

## 2014-02-06 ENCOUNTER — Encounter: Payer: Self-pay | Admitting: Internal Medicine

## 2014-02-06 ENCOUNTER — Ambulatory Visit (INDEPENDENT_AMBULATORY_CARE_PROVIDER_SITE_OTHER): Payer: Medicare Other | Admitting: Thoracic Surgery (Cardiothoracic Vascular Surgery)

## 2014-02-06 ENCOUNTER — Encounter: Payer: Self-pay | Admitting: Thoracic Surgery (Cardiothoracic Vascular Surgery)

## 2014-02-06 VITALS — BP 143/76 | HR 77 | Ht 64.0 in | Wt 199.0 lb

## 2014-02-06 DIAGNOSIS — R918 Other nonspecific abnormal finding of lung field: Secondary | ICD-10-CM

## 2014-02-06 DIAGNOSIS — R911 Solitary pulmonary nodule: Secondary | ICD-10-CM

## 2014-02-06 NOTE — Progress Notes (Signed)
HPI:  [name] returns today to discuss results for PET CT.  She is a 73 year old woman with a chief complaint of a persistent cough. She has been followed by Dr. Melvyn Novas who has treated her medically. She did have a CT scan as part of her workup, and it showed multiple nodules and scattered groundglass densities in both lungs.  On her CT in January she had a dominant groundglass opacity in the right lower lobe. On followup in April the size of the lesion was not significantly different but there was more of a solid component within the septal solid nodule. She then had a repeat CT in July which once again showed an increase in the size of the central solid component to this lesion. It was now 14 mm.   I recommended surgical resection, but wanted to see a PET CT to make sure there were no other suspicious findings given that she had multiple nodules bilaterally. She now has had that scan done in returns to discuss the results.  Past Medical History  Diagnosis Date  . Osteopenia   . Hypertriglyceridemia   . Depression with anxiety   . GERD (gastroesophageal reflux disease)   . OA (osteoarthritis)   . Colon polyp   . Atrophic vaginitis   . Kidney stones   . Contact lens/glasses fitting     wears contacts or glasses  . Asthma        Current Outpatient Prescriptions  Medication Sig Dispense Refill  . albuterol (PROAIR HFA) 108 (90 BASE) MCG/ACT inhaler Inhale 2 puffs into the lungs every 6 (six) hours as needed for wheezing or shortness of breath.  1 Inhaler  4  . Calcium-Magnesium-Vitamin D (CALCIUM 1200+D3 PO) Take 1 tablet by mouth at bedtime.      . chlorpheniramine (CHLOR-TRIMETON) 4 MG tablet Take 4 mg by mouth at bedtime.      . cycloSPORINE (RESTASIS) 0.05 % ophthalmic emulsion Place 1 drop into both eyes 2 (two) times daily.      . famotidine (PEPCID) 20 MG tablet Take 20 mg by mouth at bedtime.       . fenofibrate 160 MG tablet Take 160 mg by mouth daily.      Marland Kitchen  FLUoxetine (PROZAC) 20 MG capsule Take 40 mg by mouth daily.       . montelukast (SINGULAIR) 10 MG tablet Take 10 mg by mouth at bedtime.      . montelukast (SINGULAIR) 10 MG tablet TAKE 1 TABLET AT BEDTIME NIGHTLY  30 tablet  2  . Multiple Vitamins-Minerals (CENTRUM SILVER PO) Take 1 tablet by mouth daily.      Marland Kitchen omeprazole (PRILOSEC) 20 MG capsule Take 40 mg by mouth daily.       Marland Kitchen Respiratory Therapy Supplies (FLUTTER) DEVI Use as directed  1 each  0  . solifenacin (VESICARE) 10 MG tablet Take 10 mg by mouth daily.      . traZODone (DESYREL) 50 MG tablet Take 50 mg by mouth at bedtime.       No current facility-administered medications for this visit.    Physical Exam BP 143/76  Pulse 77  Ht 5\' 4"  (1.626 m)  Wt 199 lb (90.266 kg)  BMI 34.14 kg/m2  SpO2 21% Obese 73 year old woman in no acute distress Alert and oriented x3 no focal deficits Lungs clear  Diagnostic Tests: NUCLEAR MEDICINE PET SKULL BASE TO THIGH  TECHNIQUE:  10.9 mCi F-18 FDG was injected intravenously. Full-ring PET imaging  was  performed from the skull base to thigh after the radiotracer. CT  data was obtained and used for attenuation correction and anatomic  localization.  FASTING BLOOD GLUCOSE: Value: 95 mg/dl  COMPARISON: None.  FINDINGS:  NECK  No hypermetabolic lymph nodes in the neck.  CHEST  No hypermetabolic mediastinal or hilar nodes. Calcified  atherosclerotic disease involves the thoracic aorta. Sub solid  nodule within the superior segment of right lower lobe measures 1.8  cm and is unchanged from previous exam. The SUV max associated with  this nodule is equal to 1.17. Multiple small solid-appearing nodules  are identified within both lungs as seen previously. These are too  small to characterize by PET-CT No suspicious pulmonary nodules on  the CT scan.  ABDOMEN/PELVIS  No abnormal hypermetabolic activity within the liver, pancreas,  adrenal glands, or spleen. No hypermetabolic lymph  nodes in the  abdomen or pelvis. Stone identified within the gallbladder.  SKELETON  No focal hypermetabolic activity to suggest skeletal metastasis.  IMPRESSION:  1. Sub solid nodule within the superior segment of right lower lobe  has an SUV max equal to 1.17. Although this is below the malignant  range pulmonary adenocarcinoma is may be false negative on PET-CT.  Biopsy or surgical resection should be considered. This  recommendation follows the consensus statement: Recommendations for  the Management of Subsolid Pulmonary Nodules Detected at CT: A  Statement from the [place]. Radiology 7322;025:427-062.  2. Other small sub cm nodules  Electronically Signed  By: Kerby Moors M.D.  On: 02/05/2014 08:50   CT CHEST WITHOUT CONTRAST  TECHNIQUE:  Multidetector CT imaging of the chest was performed following the  standard protocol without IV contrast.  COMPARISON: Chest CTs dated 10/10/2013 and 07/14/2013. Abdominal CT  03/21/2008.  FINDINGS:  The mediastinum has a stable appearance without evidence of  adenopathy. There is stable mild atherosclerosis of the aorta and  coronary arteries. Mitral annular calcifications are noted.  There is stable nodularity of the thyroid gland and a stable hiatal  hernia. No significant pleural or pericardial effusion is present.  There is a stable soft tissue nodule superiorly in the right breast  on image 27.  Again demonstrated are numerous small pulmonary nodules and  scattered ground-glass densities. Dominant sub solid nodule in the  superior segment of the right lower lobe measures 2.2 x 1.5 cm on  image 27. This has a central solid component measuring at least 14  mm on sagittal image 25, suspicious for adenocarcinoma. No new or  enlarging nodules are identified.  The visualized upper abdomen has a stable appearance. There is no  evidence of adrenal mass or suspicious osseous finding.  IMPRESSION:  1. Persistent sub solid nodule  in the superior segment of the right  lower lobe. Adenocarcinoma is considered. Thoracic surgery  consultation is recommended. PET/CT should be considered for staging  purposes.These recommendations are taken from "Recommendations for  the Management of Subsolid Pulmonary Nodules Detected at CT: A  Statement from the [place]" Radiology 2013; 266:1,  304-317.  2. Additional smaller ground-glass densities and pulmonary nodules  bilaterally are unchanged, nonspecific.  3. No evidence of adenopathy or pleural effusion.  Electronically Signed  By: Camie Patience M.D.  On: 01/09/2014 13:37  Impression:  73 year old woman with a persistent cough who has multiple lung nodules bilaterally. There is a dominant nodule in the right lower lobe that measures 2.2 x 1.5 cm that has a soft tissue component in the setting of a groundglass  opacity. This is highly suspicious for a low-grade adenocarcinoma. This was not within malignant range on PET CT. However, low-grade adenocarcinomas can often be falsely negative on the PET. Fortunately the PET CT did not show any other findings of concern. Therefore, I would continue with my original recommendation which is to resect the right lower lobe nodule for definitive diagnosis.  We had previously discussed the indications, risks, benefits, and alternatives at her previous visit. We once again reviewed the operative plan including the intraoperative decision making. She had questions about her return to activities once discharged. I answered those as best I could, but emphasized the large to be determined by how she is doing postoperatively.  We're planning to proceed with a right video-assisted thoracoscopy, wedge resection of right lower lobe nodule, possible segmentectomy or lobectomy and cryo- analgesia of intercostal nerves on Monday, August 10. She understands the risks include, but are not limited to death, MI, DVT, PE, bleeding, possible need for  transfusion, infection, air leak, cardiac arrhythmias, as well as the possibility of unforeseeable complications.  She asks that I discuss her medical results and issues with her son and daughter as well as her husband.  Plan: Right VATS, wedge resection, possible segmentectomy or lobectomy, cryo-analgesia of intercostal nerves on Monday, August 10.

## 2014-02-07 NOTE — Pre-Procedure Instructions (Signed)
ZOELLA ROBERTI  02/07/2014   Your procedure is scheduled on:  Monday February 12, 2014 at 7:30 AM.  Report to [facility] Admitting at 5:30 AM.  Call this number if you have problems the morning of surgery: [phone]   Remember:   Do not eat food or drink liquids after midnight.   Take these medicines the morning of surgery with A SIP OF WATER: Albuterol inhaler if needed, Restasis eye drops, Fluoxetine (Prozac), Omeprazole (Prilosec), and Solifenacin (Vesicare)   Do not wear jewelry, make-up or nail polish.  Do not wear lotions, powders, or perfumes.   Do not shave 48 hours prior to surgery.   Do not bring valuables to the hospital.  [facility] is not responsible for any belongings or valuables.               Contacts, dentures or bridgework may not be worn into surgery.  Leave suitcase in the car. After surgery it may be brought to your room.  For patients admitted to the hospital, discharge time is determined by your treatment team.               Patients discharged the day of surgery will not be allowed to drive home.  Name and phone number of your driver: Family/Friend  Special Instructions: Shower the night before and the morning of your surgery using CHG soap   Please read over the following fact sheets that you were given: Pain Booklet, Coughing and Deep Breathing, Blood Transfusion Information, MRSA Information and Surgical Site Infection Prevention

## 2014-02-08 ENCOUNTER — Other Ambulatory Visit: Payer: Self-pay

## 2014-02-08 ENCOUNTER — Telehealth: Payer: Self-pay

## 2014-02-08 ENCOUNTER — Encounter (HOSPITAL_COMMUNITY)
Admission: RE | Admit: 2014-02-08 | Discharge: 2014-02-08 | Disposition: A | Discharge status: Home / Self Care | Payer: Medicare Other | Source: Ambulatory Visit | Attending: Thoracic Surgery (Cardiothoracic Vascular Surgery) | Admitting: Thoracic Surgery (Cardiothoracic Vascular Surgery)

## 2014-02-08 ENCOUNTER — Ambulatory Visit (HOSPITAL_COMMUNITY)
Admission: RE | Admit: 2014-02-08 | Discharge: 2014-02-08 | Disposition: A | Discharge status: Home / Self Care | Payer: Medicare Other | Source: Ambulatory Visit | Attending: Thoracic Surgery (Cardiothoracic Vascular Surgery) | Admitting: Thoracic Surgery (Cardiothoracic Vascular Surgery)

## 2014-02-08 ENCOUNTER — Encounter (HOSPITAL_COMMUNITY): Payer: Self-pay

## 2014-02-08 VITALS — BP 135/73 | HR 74 | Temp 97.9°F | Resp 20 | Ht 64.0 in | Wt 203.2 lb

## 2014-02-08 DIAGNOSIS — R911 Solitary pulmonary nodule: Secondary | ICD-10-CM

## 2014-02-08 DIAGNOSIS — Z01818 Encounter for other preprocedural examination: Secondary | ICD-10-CM | POA: Insufficient documentation

## 2014-02-08 HISTORY — DX: Nausea with vomiting, unspecified: R11.2

## 2014-02-08 HISTORY — DX: Chronic iridocyclitis, left eye: H20.12

## 2014-02-08 HISTORY — DX: Other specified postprocedural states: Z98.890

## 2014-02-08 HISTORY — DX: Other allergy status, other than to drugs and biological substances: Z91.09

## 2014-02-08 HISTORY — DX: Major depressive disorder, single episode, unspecified: F32.9

## 2014-02-08 HISTORY — DX: Anxiety disorder, unspecified: F41.9

## 2014-02-08 HISTORY — DX: Depression, unspecified: F32.A

## 2014-02-08 LAB — URINALYSIS, ROUTINE W REFLEX MICROSCOPIC
Bilirubin Urine: NEGATIVE
Glucose, UA: NEGATIVE mg/dL
Hgb urine dipstick: NEGATIVE
Ketones, ur: NEGATIVE mg/dL
Leukocytes, UA: NEGATIVE
Nitrite: NEGATIVE
Protein, ur: NEGATIVE mg/dL
Specific Gravity, Urine: 1.022 (ref 1.005–1.030)
Urobilinogen, UA: 0.2 mg/dL (ref 0.0–1.0)
pH: 5 (ref 5.0–8.0)

## 2014-02-08 LAB — CBC
HCT: 37 % (ref 36.0–46.0)
Hemoglobin: 12.2 g/dL (ref 12.0–15.0)
MCH: 30.3 pg (ref 26.0–34.0)
MCHC: 33 g/dL (ref 30.0–36.0)
MCV: 91.8 fL (ref 78.0–100.0)
Platelets: 317 10*3/uL (ref 150–400)
RBC: 4.03 MIL/uL (ref 3.87–5.11)
RDW: 13.5 % (ref 11.5–15.5)
WBC: 6.7 10*3/uL (ref 4.0–10.5)

## 2014-02-08 LAB — SURGICAL PCR SCREEN
MRSA, PCR: NEGATIVE
Staphylococcus aureus: NEGATIVE

## 2014-02-08 LAB — PULMONARY FUNCTION TEST
DL/VA % pred: 89 %
DL/VA: 4.18 ml/min/mmHg/L
DLCO cor % pred: 56 %
DLCO cor: 13.01 ml/min/mmHg
DLCO unc % pred: 56 %
DLCO unc: 13.01 ml/min/mmHg
FEF 25-75 Post: 0.58 L/sec
FEF 25-75 Pre: 0.86 L/sec
FEF2575-%Change-Post: -32 %
FEF2575-%Pred-Post: 34 %
FEF2575-%Pred-Pre: 51 %
FEV1-%Change-Post: 0 %
FEV1-%Pred-Post: 69 %
FEV1-%Pred-Pre: 69 %
FEV1-Post: 1.42 L
FEV1-Pre: 1.43 L
FEV1FVC-%Change-Post: 8 %
FEV1FVC-%Pred-Pre: 87 %
FEV6-%Change-Post: -8 %
FEV6-%Pred-Post: 74 %
FEV6-%Pred-Pre: 81 %
FEV6-Post: 1.95 L
FEV6-Pre: 2.14 L
FEV6FVC-%Change-Post: 0 %
FEV6FVC-%Pred-Post: 103 %
FEV6FVC-%Pred-Pre: 103 %
FVC-%Change-Post: -8 %
FVC-%Pred-Post: 72 %
FVC-%Pred-Pre: 79 %
FVC-Post: 1.99 L
FVC-Pre: 2.17 L
Post FEV1/FVC ratio: 72 %
Post FEV6/FVC ratio: 98 %
Pre FEV1/FVC ratio: 66 %
Pre FEV6/FVC Ratio: 99 %
RV % pred: 101 %
RV: 2.25 L
TLC % pred: 91 %
TLC: 4.48 L

## 2014-02-08 LAB — COMPREHENSIVE METABOLIC PANEL
ALT: 17 U/L (ref 0–35)
AST: 28 U/L (ref 0–37)
Albumin: 3.6 g/dL (ref 3.5–5.2)
Alkaline Phosphatase: 40 U/L (ref 39–117)
Anion gap: 13 (ref 5–15)
BUN: 19 mg/dL (ref 6–23)
CO2: 20 mEq/L (ref 19–32)
Calcium: 9.8 mg/dL (ref 8.4–10.5)
Chloride: 105 mEq/L (ref 96–112)
Creatinine, Ser: 0.96 mg/dL (ref 0.50–1.10)
GFR calc Af Amer: 66 mL/min — ABNORMAL LOW (ref >90)
GFR calc non Af Amer: 57 mL/min — ABNORMAL LOW (ref >90)
Glucose, Bld: 115 mg/dL — ABNORMAL HIGH (ref 70–99)
Potassium: 4.6 mEq/L (ref 3.7–5.3)
Sodium: 138 mEq/L (ref 137–147)
Total Bilirubin: 0.2 mg/dL — ABNORMAL LOW (ref 0.3–1.2)
Total Protein: 7.1 g/dL (ref 6.0–8.3)

## 2014-02-08 LAB — BLOOD GAS, ARTERIAL
Acid-base deficit: 0.3 mmol/L (ref 0.0–2.0)
Bicarbonate: 23.7 mEq/L (ref 20.0–24.0)
Drawn by: 20636
FIO2: 0.21 %
O2 Saturation: 95.6 %
Patient temperature: 98.6
TCO2: 24.9 mmol/L (ref 0–100)
pCO2 arterial: 38.2 mmHg (ref 35.0–45.0)
pH, Arterial: 7.41 (ref 7.350–7.450)
pO2, Arterial: 78.5 mmHg — ABNORMAL LOW (ref 80.0–100.0)

## 2014-02-08 LAB — TYPE AND SCREEN
ABO/RH(D): A POS
Antibody Screen: NEGATIVE

## 2014-02-08 LAB — APTT: aPTT: 27 seconds (ref 24–37)

## 2014-02-08 LAB — ABO/RH: ABO/RH(D): A POS

## 2014-02-08 LAB — PROTIME-INR
INR: 1.06 (ref 0.00–1.49)
Prothrombin Time: 13.8 seconds (ref 11.6–15.2)

## 2014-02-08 MED ORDER — ALBUTEROL SULFATE (2.5 MG/3ML) 0.083% IN NEBU
2.5000 mg | INHALATION_SOLUTION | Freq: Once | RESPIRATORY_TRACT | Status: AC
  Administered 2014-02-08: 2.5 mg via RESPIRATORY_TRACT

## 2014-02-08 NOTE — Progress Notes (Signed)
Patient informed Nurse that Dr. Roxan Hockey informed her that her surgery would not be at 0730 on Monday due to several physicians from their office being out of town. Nurse called and left a voicemail with the scheduler at [place] her of patients concern about time of arrival on Monday. Will instruct patient to arrive at 0530 and if anything changes, short stay staff with call with a updated time. Direct number to short stay given to patient.

## 2014-02-08 NOTE — Progress Notes (Signed)
Patient denied having any chest discomfort and informed Nurse that she used Albuterol inhaler about a week ago. Patient denied having any current shortness of breath. PCP is Hulan Fess. Patient will need chest xray DOS.

## 2014-02-09 ENCOUNTER — Encounter (HOSPITAL_COMMUNITY): Payer: Self-pay

## 2014-02-11 MED ORDER — DEXTROSE 5 % IV SOLN
1.5000 g | INTRAVENOUS | Status: AC
  Administered 2014-02-12: 1.5 g via INTRAVENOUS
  Filled 2014-02-11: qty 1.5

## 2014-02-12 ENCOUNTER — Encounter (HOSPITAL_COMMUNITY): Payer: Self-pay | Admitting: Certified Registered Nurse Anesthetist

## 2014-02-12 ENCOUNTER — Encounter (HOSPITAL_COMMUNITY)
Admission: RE | Disposition: A | Discharge status: Home / Self Care | Payer: Self-pay | Source: Ambulatory Visit | Attending: Thoracic Surgery (Cardiothoracic Vascular Surgery)

## 2014-02-12 ENCOUNTER — Inpatient Hospital Stay (HOSPITAL_COMMUNITY): Payer: Medicare Other

## 2014-02-12 ENCOUNTER — Inpatient Hospital Stay (HOSPITAL_COMMUNITY): Payer: Medicare Other | Admitting: Certified Registered Nurse Anesthetist

## 2014-02-12 ENCOUNTER — Encounter (HOSPITAL_COMMUNITY): Payer: Medicare Other | Admitting: Certified Registered Nurse Anesthetist

## 2014-02-12 ENCOUNTER — Inpatient Hospital Stay (HOSPITAL_COMMUNITY)
Admission: RE | Admit: 2014-02-12 | Discharge: 2014-02-17 | DRG: 165 | Disposition: A | Discharge status: Home / Self Care | Payer: Medicare Other | Source: Ambulatory Visit | Attending: Thoracic Surgery (Cardiothoracic Vascular Surgery) | Admitting: Thoracic Surgery (Cardiothoracic Vascular Surgery)

## 2014-02-12 ENCOUNTER — Encounter: Payer: Self-pay | Admitting: Internal Medicine

## 2014-02-12 DIAGNOSIS — K219 Gastro-esophageal reflux disease without esophagitis: Secondary | ICD-10-CM | POA: Diagnosis present

## 2014-02-12 DIAGNOSIS — M1990 Unspecified osteoarthritis, unspecified site: Secondary | ICD-10-CM | POA: Diagnosis present

## 2014-02-12 DIAGNOSIS — J45909 Unspecified asthma, uncomplicated: Secondary | ICD-10-CM | POA: Diagnosis present

## 2014-02-12 DIAGNOSIS — C3430 Malignant neoplasm of lower lobe, unspecified bronchus or lung: Secondary | ICD-10-CM | POA: Diagnosis present

## 2014-02-12 DIAGNOSIS — I10 Essential (primary) hypertension: Secondary | ICD-10-CM | POA: Diagnosis present

## 2014-02-12 DIAGNOSIS — Z23 Encounter for immunization: Secondary | ICD-10-CM

## 2014-02-12 DIAGNOSIS — R05 Cough: Secondary | ICD-10-CM | POA: Diagnosis present

## 2014-02-12 DIAGNOSIS — Z902 Acquired absence of lung [part of]: Secondary | ICD-10-CM

## 2014-02-12 DIAGNOSIS — M899 Disorder of bone, unspecified: Secondary | ICD-10-CM | POA: Diagnosis present

## 2014-02-12 DIAGNOSIS — E781 Pure hyperglyceridemia: Secondary | ICD-10-CM | POA: Diagnosis present

## 2014-02-12 DIAGNOSIS — M949 Disorder of cartilage, unspecified: Secondary | ICD-10-CM

## 2014-02-12 DIAGNOSIS — R059 Cough, unspecified: Secondary | ICD-10-CM | POA: Diagnosis present

## 2014-02-12 DIAGNOSIS — F341 Dysthymic disorder: Secondary | ICD-10-CM | POA: Diagnosis present

## 2014-02-12 DIAGNOSIS — Z87891 Personal history of nicotine dependence: Secondary | ICD-10-CM

## 2014-02-12 DIAGNOSIS — R222 Localized swelling, mass and lump, trunk: Secondary | ICD-10-CM

## 2014-02-12 DIAGNOSIS — R911 Solitary pulmonary nodule: Secondary | ICD-10-CM

## 2014-02-12 HISTORY — DX: Acquired absence of lung (part of): Z90.2

## 2014-02-12 HISTORY — PX: VIDEO ASSISTED THORACOSCOPY (VATS)/WEDGE RESECTION: SHX6174

## 2014-02-12 HISTORY — PX: VIDEO BRONCHOSCOPY: SHX5072

## 2014-02-12 SURGERY — VIDEO ASSISTED THORACOSCOPY (VATS)/WEDGE RESECTION
Anesthesia: General | Site: Chest | Laterality: Right

## 2014-02-12 MED ORDER — MIDAZOLAM HCL 2 MG/2ML IJ SOLN
INTRAMUSCULAR | Status: AC
  Filled 2014-02-12: qty 2

## 2014-02-12 MED ORDER — DIPHENHYDRAMINE HCL 12.5 MG/5ML PO ELIX
12.5000 mg | ORAL_SOLUTION | Freq: Four times a day (QID) | ORAL | Status: DC | PRN
Start: 2014-02-12 — End: 2014-02-17
  Filled 2014-02-12: qty 5

## 2014-02-12 MED ORDER — DEXAMETHASONE SODIUM PHOSPHATE 4 MG/ML IJ SOLN
INTRAMUSCULAR | Status: DC | PRN
  Administered 2014-02-12: 8 mg via INTRAVENOUS

## 2014-02-12 MED ORDER — GLYCOPYRROLATE 0.2 MG/ML IJ SOLN
INTRAMUSCULAR | Status: DC | PRN
  Administered 2014-02-12: .8 mg via INTRAVENOUS

## 2014-02-12 MED ORDER — FLUOXETINE HCL 20 MG PO CAPS
40.0000 mg | ORAL_CAPSULE | Freq: Every day | ORAL | Status: DC
  Administered 2014-02-13 – 2014-02-17 (×5): 40 mg via ORAL
  Filled 2014-02-12 (×5): qty 2

## 2014-02-12 MED ORDER — LIDOCAINE HCL (CARDIAC) 20 MG/ML IV SOLN
INTRAVENOUS | Status: AC
  Filled 2014-02-12: qty 5

## 2014-02-12 MED ORDER — PROPOFOL INFUSION 10 MG/ML OPTIME
INTRAVENOUS | Status: DC | PRN
  Administered 2014-02-12: 25 ug/kg/min via INTRAVENOUS

## 2014-02-12 MED ORDER — GLYCOPYRROLATE 0.2 MG/ML IJ SOLN
INTRAMUSCULAR | Status: AC
  Filled 2014-02-12: qty 4

## 2014-02-12 MED ORDER — METOCLOPRAMIDE HCL 5 MG/ML IJ SOLN
10.0000 mg | Freq: Four times a day (QID) | INTRAMUSCULAR | Status: AC
  Administered 2014-02-12 – 2014-02-13 (×3): 10 mg via INTRAVENOUS
  Filled 2014-02-12 (×4): qty 2

## 2014-02-12 MED ORDER — DARIFENACIN HYDROBROMIDE ER 15 MG PO TB24
15.0000 mg | ORAL_TABLET | Freq: Every day | ORAL | Status: DC
  Administered 2014-02-13 – 2014-02-17 (×5): 15 mg via ORAL
  Filled 2014-02-12 (×5): qty 1

## 2014-02-12 MED ORDER — ACETAMINOPHEN 500 MG PO TABS
1000.0000 mg | ORAL_TABLET | Freq: Four times a day (QID) | ORAL | Status: DC
  Administered 2014-02-13 – 2014-02-17 (×13): 1000 mg via ORAL
  Filled 2014-02-12 (×18): qty 2

## 2014-02-12 MED ORDER — OXYCODONE HCL 5 MG/5ML PO SOLN: 5.0000 mg | Freq: Once | ORAL | Status: DC | PRN

## 2014-02-12 MED ORDER — DIPHENHYDRAMINE HCL 12.5 MG/5ML PO ELIX: 12.5000 mg | ORAL_SOLUTION | Freq: Four times a day (QID) | ORAL | Status: DC | PRN

## 2014-02-12 MED ORDER — SODIUM CHLORIDE 0.9 % IJ SOLN: 9.0000 mL | INTRAMUSCULAR | Status: DC | PRN

## 2014-02-12 MED ORDER — ARTIFICIAL TEARS OP OINT
TOPICAL_OINTMENT | OPHTHALMIC | Status: AC
  Filled 2014-02-12: qty 3.5

## 2014-02-12 MED ORDER — ONDANSETRON HCL 4 MG/2ML IJ SOLN: 4.0000 mg | Freq: Four times a day (QID) | INTRAMUSCULAR | Status: DC | PRN

## 2014-02-12 MED ORDER — FENOFIBRATE 160 MG PO TABS
160.0000 mg | ORAL_TABLET | Freq: Every day | ORAL | Status: DC
  Administered 2014-02-12 – 2014-02-17 (×6): 160 mg via ORAL
  Filled 2014-02-12 (×6): qty 1

## 2014-02-12 MED ORDER — NEOSTIGMINE METHYLSULFATE 10 MG/10ML IV SOLN
INTRAVENOUS | Status: AC
  Filled 2014-02-12: qty 1

## 2014-02-12 MED ORDER — PANTOPRAZOLE SODIUM 40 MG PO TBEC
80.0000 mg | DELAYED_RELEASE_TABLET | Freq: Every day | ORAL | Status: DC
  Administered 2014-02-13 – 2014-02-17 (×5): 80 mg via ORAL
  Filled 2014-02-12 (×5): qty 2

## 2014-02-12 MED ORDER — BUPIVACAINE 0.5 % ON-Q PUMP SINGLE CATH 400 ML
400.0000 mL | INJECTION | Status: AC
  Filled 2014-02-12 (×2): qty 400

## 2014-02-12 MED ORDER — ONDANSETRON HCL 4 MG/2ML IJ SOLN
INTRAMUSCULAR | Status: AC
  Filled 2014-02-12: qty 2

## 2014-02-12 MED ORDER — LABETALOL HCL 5 MG/ML IV SOLN
INTRAVENOUS | Status: DC | PRN
  Administered 2014-02-12 (×2): 5 mg via INTRAVENOUS

## 2014-02-12 MED ORDER — DIPHENHYDRAMINE HCL 50 MG/ML IJ SOLN: 12.5000 mg | Freq: Four times a day (QID) | INTRAMUSCULAR | Status: DC | PRN

## 2014-02-12 MED ORDER — ONDANSETRON HCL 4 MG/2ML IJ SOLN
INTRAMUSCULAR | Status: DC | PRN
  Administered 2014-02-12: 4 mg via INTRAVENOUS

## 2014-02-12 MED ORDER — HYDROMORPHONE HCL PF 1 MG/ML IJ SOLN
0.2500 mg | INTRAMUSCULAR | Status: DC | PRN
  Administered 2014-02-12 (×2): 0.5 mg via INTRAVENOUS

## 2014-02-12 MED ORDER — PROPOFOL 10 MG/ML IV BOLUS
INTRAVENOUS | Status: AC
  Filled 2014-02-12: qty 20

## 2014-02-12 MED ORDER — PROMETHAZINE HCL 25 MG/ML IJ SOLN: 6.2500 mg | INTRAMUSCULAR | Status: DC | PRN

## 2014-02-12 MED ORDER — FAMOTIDINE 20 MG PO TABS
20.0000 mg | ORAL_TABLET | Freq: Every day | ORAL | Status: DC
  Administered 2014-02-12 – 2014-02-16 (×5): 20 mg via ORAL
  Filled 2014-02-12 (×6): qty 1

## 2014-02-12 MED ORDER — NALOXONE HCL 0.4 MG/ML IJ SOLN: 0.4000 mg | INTRAMUSCULAR | Status: DC | PRN

## 2014-02-12 MED ORDER — FENTANYL 10 MCG/ML IV SOLN
INTRAVENOUS | Status: DC
  Administered 2014-02-12: 18:00:00 via INTRAVENOUS
  Filled 2014-02-12 (×2): qty 50

## 2014-02-12 MED ORDER — FENTANYL CITRATE 0.05 MG/ML IJ SOLN
INTRAMUSCULAR | Status: AC
  Filled 2014-02-12: qty 5

## 2014-02-12 MED ORDER — PROPOFOL 10 MG/ML IV BOLUS
INTRAVENOUS | Status: DC | PRN
  Administered 2014-02-12 (×3): 50 mg via INTRAVENOUS
  Administered 2014-02-12: 40 mg via INTRAVENOUS
  Administered 2014-02-12: 50 mg via INTRAVENOUS
  Administered 2014-02-12: 160 mg via INTRAVENOUS

## 2014-02-12 MED ORDER — ROCURONIUM BROMIDE 50 MG/5ML IV SOLN
INTRAVENOUS | Status: AC
  Filled 2014-02-12: qty 1

## 2014-02-12 MED ORDER — MIDAZOLAM HCL 5 MG/5ML IJ SOLN
INTRAMUSCULAR | Status: DC | PRN
  Administered 2014-02-12 (×2): 1 mg via INTRAVENOUS

## 2014-02-12 MED ORDER — FENTANYL CITRATE 0.05 MG/ML IJ SOLN
INTRAMUSCULAR | Status: DC | PRN
  Administered 2014-02-12: 50 ug via INTRAVENOUS
  Administered 2014-02-12: 150 ug via INTRAVENOUS
  Administered 2014-02-12 (×4): 50 ug via INTRAVENOUS

## 2014-02-12 MED ORDER — BISACODYL 5 MG PO TBEC
10.0000 mg | DELAYED_RELEASE_TABLET | Freq: Every day | ORAL | Status: DC
  Administered 2014-02-15 – 2014-02-17 (×2): 10 mg via ORAL
  Filled 2014-02-12 (×5): qty 2

## 2014-02-12 MED ORDER — FENTANYL 10 MCG/ML IV SOLN
INTRAVENOUS | Status: DC
  Administered 2014-02-12: 30 ug via INTRAVENOUS
  Administered 2014-02-13: 60 ug via INTRAVENOUS
  Administered 2014-02-13 (×2): 70 ug via INTRAVENOUS
  Administered 2014-02-13: 10 ug via INTRAVENOUS
  Administered 2014-02-13: 70 ug via INTRAVENOUS
  Administered 2014-02-14: 80 ug via INTRAVENOUS
  Administered 2014-02-14: 30 ug via INTRAVENOUS
  Administered 2014-02-14: 10 ug via INTRAVENOUS
  Administered 2014-02-14: 16:00:00 via INTRAVENOUS
  Administered 2014-02-14: 20 ug via INTRAVENOUS
  Administered 2014-02-14: 40 ug via INTRAVENOUS
  Administered 2014-02-14: 10 ug via INTRAVENOUS
  Administered 2014-02-15: 20 ug via INTRAVENOUS
  Administered 2014-02-15: 50 ug via INTRAVENOUS
  Administered 2014-02-15: 60 ug via INTRAVENOUS
  Administered 2014-02-15: 40 ug via INTRAVENOUS
  Administered 2014-02-15 (×2): 70 ug via INTRAVENOUS
  Administered 2014-02-16 (×3): 50 ug via INTRAVENOUS
  Administered 2014-02-16: 40 ug via INTRAVENOUS
  Administered 2014-02-16: 30 ug via INTRAVENOUS
  Administered 2014-02-16: 40 ug via INTRAVENOUS
  Administered 2014-02-17: 50 ug via INTRAVENOUS
  Filled 2014-02-12 (×3): qty 50

## 2014-02-12 MED ORDER — POTASSIUM CHLORIDE 10 MEQ/50ML IV SOLN: 10.0000 meq | Freq: Every day | INTRAVENOUS | Status: DC | PRN

## 2014-02-12 MED ORDER — LACTATED RINGERS IV SOLN
INTRAVENOUS | Status: DC | PRN
  Administered 2014-02-12: 11:00:00 via INTRAVENOUS

## 2014-02-12 MED ORDER — 0.9 % SODIUM CHLORIDE (POUR BTL) OPTIME
TOPICAL | Status: DC | PRN
  Administered 2014-02-12: 3000 mL

## 2014-02-12 MED ORDER — LEVALBUTEROL HCL 0.63 MG/3ML IN NEBU
0.6300 mg | INHALATION_SOLUTION | Freq: Four times a day (QID) | RESPIRATORY_TRACT | Status: DC
  Administered 2014-02-12: 0.63 mg via RESPIRATORY_TRACT
  Filled 2014-02-12: qty 3

## 2014-02-12 MED ORDER — SCOPOLAMINE 1 MG/3DAYS TD PT72
MEDICATED_PATCH | TRANSDERMAL | Status: AC
  Administered 2014-02-12: 1 via TRANSDERMAL
  Filled 2014-02-12: qty 1

## 2014-02-12 MED ORDER — BUPIVACAINE 0.5 % ON-Q PUMP SINGLE CATH 400 ML
INJECTION | Status: DC | PRN
  Administered 2014-02-12: 400 mL

## 2014-02-12 MED ORDER — FENTANYL CITRATE 0.05 MG/ML IJ SOLN
INTRAMUSCULAR | Status: AC
  Filled 2014-02-12: qty 2

## 2014-02-12 MED ORDER — EPHEDRINE SULFATE 50 MG/ML IJ SOLN
INTRAMUSCULAR | Status: DC | PRN
  Administered 2014-02-12 (×4): 5 mg via INTRAVENOUS

## 2014-02-12 MED ORDER — ROCURONIUM BROMIDE 100 MG/10ML IV SOLN
INTRAVENOUS | Status: DC | PRN
  Administered 2014-02-12: 20 mg via INTRAVENOUS
  Administered 2014-02-12: 50 mg via INTRAVENOUS
  Administered 2014-02-12: 20 mg via INTRAVENOUS

## 2014-02-12 MED ORDER — LIDOCAINE HCL (CARDIAC) 20 MG/ML IV SOLN
INTRAVENOUS | Status: DC | PRN
  Administered 2014-02-12: 80 mg via INTRAVENOUS

## 2014-02-12 MED ORDER — OXYCODONE HCL 5 MG PO TABS: 5.0000 mg | ORAL_TABLET | Freq: Once | ORAL | Status: DC | PRN

## 2014-02-12 MED ORDER — SENNOSIDES-DOCUSATE SODIUM 8.6-50 MG PO TABS
1.0000 | ORAL_TABLET | Freq: Every day | ORAL | Status: DC
  Administered 2014-02-12 – 2014-02-14 (×3): 1 via ORAL
  Filled 2014-02-12 (×6): qty 1

## 2014-02-12 MED ORDER — DEXTROSE 5 % IV SOLN
1.5000 g | Freq: Two times a day (BID) | INTRAVENOUS | Status: AC
  Administered 2014-02-12 – 2014-02-13 (×2): 1.5 g via INTRAVENOUS
  Filled 2014-02-12 (×2): qty 1.5

## 2014-02-12 MED ORDER — BUPIVACAINE HCL (PF) 0.5 % IJ SOLN
INTRAMUSCULAR | Status: DC | PRN
  Administered 2014-02-12: 10 mL

## 2014-02-12 MED ORDER — CYCLOSPORINE 0.05 % OP EMUL
1.0000 [drp] | Freq: Two times a day (BID) | OPHTHALMIC | Status: DC
  Administered 2014-02-12 – 2014-02-16 (×9): 1 [drp] via OPHTHALMIC
  Filled 2014-02-12 (×12): qty 1

## 2014-02-12 MED ORDER — OXYCODONE HCL 5 MG PO TABS
5.0000 mg | ORAL_TABLET | ORAL | Status: DC | PRN
  Administered 2014-02-15 – 2014-02-16 (×4): 5 mg via ORAL
  Administered 2014-02-17: 10 mg via ORAL
  Filled 2014-02-12 (×4): qty 1
  Filled 2014-02-12: qty 2

## 2014-02-12 MED ORDER — NEOSTIGMINE METHYLSULFATE 10 MG/10ML IV SOLN
INTRAVENOUS | Status: DC | PRN
  Administered 2014-02-12: 5 mg via INTRAVENOUS
  Administered 2014-02-12: 1 mg via INTRAVENOUS

## 2014-02-12 MED ORDER — ALBUTEROL SULFATE (2.5 MG/3ML) 0.083% IN NEBU: 3.0000 mL | INHALATION_SOLUTION | Freq: Four times a day (QID) | RESPIRATORY_TRACT | Status: DC | PRN

## 2014-02-12 MED ORDER — ACETAMINOPHEN 160 MG/5ML PO SOLN
1000.0000 mg | Freq: Four times a day (QID) | ORAL | Status: DC
Start: 2014-02-12 — End: 2014-02-17
  Administered 2014-02-12 – 2014-02-13 (×2): 1000 mg via ORAL
  Filled 2014-02-12 (×20): qty 40

## 2014-02-12 MED ORDER — DEXTROSE-NACL 5-0.9 % IV SOLN
INTRAVENOUS | Status: DC
  Administered 2014-02-12 – 2014-02-16 (×2): via INTRAVENOUS

## 2014-02-12 MED ORDER — LEVALBUTEROL HCL 0.63 MG/3ML IN NEBU
0.6300 mg | INHALATION_SOLUTION | Freq: Two times a day (BID) | RESPIRATORY_TRACT | Status: DC
  Administered 2014-02-13 – 2014-02-17 (×9): 0.63 mg via RESPIRATORY_TRACT
  Filled 2014-02-12 (×17): qty 3

## 2014-02-12 MED ORDER — BUPIVACAINE HCL (PF) 0.5 % IJ SOLN
INTRAMUSCULAR | Status: AC
  Filled 2014-02-12: qty 10

## 2014-02-12 MED ORDER — MONTELUKAST SODIUM 10 MG PO TABS
10.0000 mg | ORAL_TABLET | Freq: Every day | ORAL | Status: DC
  Administered 2014-02-12 – 2014-02-16 (×5): 10 mg via ORAL
  Filled 2014-02-12 (×6): qty 1

## 2014-02-12 MED ORDER — LEVALBUTEROL HCL 0.63 MG/3ML IN NEBU: 0.6300 mg | INHALATION_SOLUTION | Freq: Four times a day (QID) | RESPIRATORY_TRACT | Status: DC | PRN

## 2014-02-12 MED ORDER — HYDROMORPHONE HCL PF 1 MG/ML IJ SOLN
INTRAMUSCULAR | Status: AC
  Administered 2014-02-12: 0.5 mg via INTRAVENOUS
  Filled 2014-02-12: qty 2

## 2014-02-12 MED ORDER — LACTATED RINGERS IV SOLN
INTRAVENOUS | Status: DC
  Administered 2014-02-12: 11:00:00 via INTRAVENOUS

## 2014-02-12 MED ORDER — LACTATED RINGERS IV SOLN
INTRAVENOUS | Status: DC | PRN
  Administered 2014-02-12 (×2): via INTRAVENOUS

## 2014-02-12 MED ORDER — ARTIFICIAL TEARS OP OINT
TOPICAL_OINTMENT | OPHTHALMIC | Status: DC | PRN
  Administered 2014-02-12: 1 via OPHTHALMIC

## 2014-02-12 SURGICAL SUPPLY — 89 items
ADH SKN CLS APL DERMABOND .7 (GAUZE/BANDAGES/DRESSINGS) ×2
APL SKNCLS STERI-STRIP NONHPOA (GAUZE/BANDAGES/DRESSINGS) ×3
BAG SPEC RTRVL LRG 6X4 10 (ENDOMECHANICALS)
BENZOIN TINCTURE PRP APPL 2/3 (GAUZE/BANDAGES/DRESSINGS) ×4 IMPLANT
CANISTER SUCTION 2500CC (MISCELLANEOUS) ×8 IMPLANT
CATH KIT ON Q 5IN SLV (PAIN MANAGEMENT) IMPLANT
CATH THORACIC 28FR (CATHETERS) IMPLANT
CATH THORACIC 36FR (CATHETERS) IMPLANT
CATH THORACIC 36FR RT ANG (CATHETERS) IMPLANT
CLIP TI MEDIUM 6 (CLIP) ×4 IMPLANT
CONN ST 1/4X3/8  BEN (MISCELLANEOUS)
CONN ST 1/4X3/8 BEN (MISCELLANEOUS) IMPLANT
CONN Y 3/8X3/8X3/8  BEN (MISCELLANEOUS)
CONN Y 3/8X3/8X3/8 BEN (MISCELLANEOUS) IMPLANT
CONT SPEC 4OZ CLIKSEAL STRL BL (MISCELLANEOUS) ×8 IMPLANT
COVER SURGICAL LIGHT HANDLE (MISCELLANEOUS) ×4 IMPLANT
DERMABOND ADVANCED (GAUZE/BANDAGES/DRESSINGS) ×1
DERMABOND ADVANCED .7 DNX12 (GAUZE/BANDAGES/DRESSINGS) ×3 IMPLANT
DRAIN CHANNEL 28F RND 3/8 FF (WOUND CARE) IMPLANT
DRAIN CHANNEL 32F RND 10.7 FF (WOUND CARE) IMPLANT
DRAPE LAPAROSCOPIC ABDOMINAL (DRAPES) ×4 IMPLANT
DRAPE WARM FLUID 44X44 (DRAPE) ×4 IMPLANT
ELECT REM PT RETURN 9FT ADLT (ELECTROSURGICAL) ×4
ELECTRODE REM PT RTRN 9FT ADLT (ELECTROSURGICAL) ×3 IMPLANT
GAUZE SPONGE 4X4 12PLY STRL (GAUZE/BANDAGES/DRESSINGS) ×4 IMPLANT
GLOVE BIOGEL M 6.5 STRL (GLOVE) ×8 IMPLANT
GLOVE BIOGEL PI IND STRL 6.5 (GLOVE) ×3 IMPLANT
GLOVE BIOGEL PI INDICATOR 6.5 (GLOVE) ×1
GLOVE SURG SIGNA 7.5 PF LTX (GLOVE) ×8 IMPLANT
GOWN STRL REUS W/ TWL LRG LVL3 (GOWN DISPOSABLE) ×6 IMPLANT
GOWN STRL REUS W/ TWL XL LVL3 (GOWN DISPOSABLE) ×12 IMPLANT
GOWN STRL REUS W/TWL LRG LVL3 (GOWN DISPOSABLE) ×8
GOWN STRL REUS W/TWL XL LVL3 (GOWN DISPOSABLE) ×12
HANDLE STAPLE ENDO GIA SHORT (STAPLE)
HEMOSTAT SURGICEL 2X14 (HEMOSTASIS) IMPLANT
KIT BASIN OR (CUSTOM PROCEDURE TRAY) ×4 IMPLANT
KIT ROOM TURNOVER OR (KITS) ×4 IMPLANT
KIT SUCTION CATH 14FR (SUCTIONS) ×4 IMPLANT
NS IRRIG 1000ML POUR BTL (IV SOLUTION) ×16 IMPLANT
PACK CHEST (CUSTOM PROCEDURE TRAY) ×4 IMPLANT
PAD ARMBOARD 7.5X6 YLW CONV (MISCELLANEOUS) ×8 IMPLANT
POUCH ENDO CATCH II 15MM (MISCELLANEOUS) IMPLANT
POUCH SPECIMEN RETRIEVAL 10MM (ENDOMECHANICALS) IMPLANT
RELOAD EGIA 45 MED/THCK PURPLE (STAPLE) ×28 IMPLANT
RELOAD EGIA 45 TAN VASC (STAPLE) ×4 IMPLANT
RELOAD EGIA 60 MED/THCK PURPLE (STAPLE) ×8 IMPLANT
RELOAD EGIA TRIS TAN 45 CVD (STAPLE) ×8 IMPLANT
RELOAD TRI 2.0 60 XTHK VAS SUL (STAPLE) ×8 IMPLANT
SEALANT PROGEL (MISCELLANEOUS) IMPLANT
SEALANT SURG COSEAL 4ML (VASCULAR PRODUCTS) IMPLANT
SEALANT SURG COSEAL 8ML (VASCULAR PRODUCTS) IMPLANT
SOLUTION ANTI FOG 6CC (MISCELLANEOUS) ×4 IMPLANT
SPECIMEN JAR MEDIUM (MISCELLANEOUS) ×4 IMPLANT
SPONGE GAUZE 4X4 12PLY STER LF (GAUZE/BANDAGES/DRESSINGS) ×4 IMPLANT
STAPLER ENDO GIA 12MM SHORT (STAPLE) IMPLANT
SUT PROLENE 4 0 RB 1 (SUTURE)
SUT PROLENE 4-0 RB1 .5 CRCL 36 (SUTURE) IMPLANT
SUT SILK  1 MH (SUTURE) ×2
SUT SILK 1 MH (SUTURE) ×6 IMPLANT
SUT SILK 2 0 SH (SUTURE) IMPLANT
SUT SILK 2 0SH CR/8 30 (SUTURE) IMPLANT
SUT SILK 3 0 SH 30 (SUTURE) IMPLANT
SUT SILK 3 0SH CR/8 30 (SUTURE) IMPLANT
SUT VIC AB 0 CTX 27 (SUTURE) IMPLANT
SUT VIC AB 1 CTX 27 (SUTURE) ×4 IMPLANT
SUT VIC AB 2-0 CT1 27 (SUTURE) ×4
SUT VIC AB 2-0 CT1 TAPERPNT 27 (SUTURE) ×3 IMPLANT
SUT VIC AB 2-0 CTX 36 (SUTURE) ×4 IMPLANT
SUT VIC AB 3-0 MH 27 (SUTURE) IMPLANT
SUT VIC AB 3-0 SH 27 (SUTURE)
SUT VIC AB 3-0 SH 27X BRD (SUTURE) IMPLANT
SUT VIC AB 3-0 X1 27 (SUTURE) ×4 IMPLANT
SUT VICRYL 0 UR6 27IN ABS (SUTURE) ×8 IMPLANT
SUT VICRYL 2 TP 1 (SUTURE) ×4 IMPLANT
SWAB COLLECTION DEVICE MRSA (MISCELLANEOUS) IMPLANT
SYR 20ML ECCENTRIC (SYRINGE) ×4 IMPLANT
SYSTEM SAHARA CHEST DRAIN ATS (WOUND CARE) ×4 IMPLANT
TAPE CLOTH SURG 4X10 WHT LF (GAUZE/BANDAGES/DRESSINGS) ×4 IMPLANT
TIP APPLICATOR SPRAY EXTEND 16 (VASCULAR PRODUCTS) IMPLANT
TOWEL OR 17X24 6PK STRL BLUE (TOWEL DISPOSABLE) ×4 IMPLANT
TOWEL OR 17X26 10 PK STRL BLUE (TOWEL DISPOSABLE) ×8 IMPLANT
TRAP SPECIMEN MUCOUS 40CC (MISCELLANEOUS) IMPLANT
TRAY FOLEY CATH 16FRSI W/METER (SET/KITS/TRAYS/PACK) ×4 IMPLANT
TROCAR XCEL BLADELESS 5X75MML (TROCAR) ×4 IMPLANT
TROCAR XCEL NON-BLD 5MMX100MML (ENDOMECHANICALS) IMPLANT
TUBE ANAEROBIC SPECIMEN COL (MISCELLANEOUS) IMPLANT
TUBE CONNECTING 12X1/4 (SUCTIONS) ×4 IMPLANT
TUNNELER SHEATH ON-Q 11GX8 DSP (PAIN MANAGEMENT) IMPLANT
WATER STERILE IRR 1000ML POUR (IV SOLUTION) ×8 IMPLANT

## 2014-02-12 NOTE — Transfer of Care (Signed)
Immediate Anesthesia Transfer of Care Note  Patient: [name]  Procedure(s) Performed: Procedure(s): RIGHT VIDEO ASSISTED THORACOSCOPY RIGHT LOWER LOBE LUNG /WEDGE RESECTION,RIGHT THORACOTOMY WITH RIGHT LOWER LOBE LOBECTOMY & NODE DISSECTION (Right) VIDEO BRONCHOSCOPY (N/A)  Patient Location: PACU  Anesthesia Type:General  Level of Consciousness: awake and alert   Airway & Oxygen Therapy: Patient Spontanous Breathing and Patient connected to face mask oxygen  Post-op Assessment: Report given to PACU RN, Post -op Vital signs reviewed and stable and Patient moving all extremities X 4  Post vital signs: Reviewed and stable  Complications: No apparent anesthesia complications

## 2014-02-12 NOTE — Anesthesia Preprocedure Evaluation (Addendum)
Anesthesia Evaluation  Patient identified by MRN, date of birth, ID band Patient awake    Reviewed: Allergy & Precautions, H&P , NPO status , Patient's Chart, lab work & pertinent test results  History of Anesthesia Complications (+) PONV and history of anesthetic complications  Airway Mallampati: II TM Distance: >3 FB Neck ROM: Full    Dental  (+) Dental Advisory Given, Teeth Intact   Pulmonary asthma , former smoker,  Cough, pulm nodules. Ground glass appearance on Xray of lung fields         Cardiovascular negative cardio ROS  Rhythm:regular Rate:Normal     Neuro/Psych PSYCHIATRIC DISORDERS Anxiety Depression    GI/Hepatic Neg liver ROS, GERD-  Medicated,  Endo/Other  negative endocrine ROSMorbid obesity  Renal/GU      Musculoskeletal  (+) Arthritis -, Osteoarthritis,    Abdominal   Peds  Hematology negative hematology ROS (+)   Anesthesia Other Findings   Reproductive/Obstetrics                        Anesthesia Physical Anesthesia Plan  ASA: III  Anesthesia Plan: General ETT and General   Post-op Pain Management:    Induction: Intravenous  Airway Management Planned: Double Lumen EBT  Additional Equipment: Arterial line and CVP  Intra-op Plan:   Post-operative Plan: Extubation in OR and Possible Post-op intubation/ventilation  Informed Consent: I have reviewed the patients History and Physical, chart, labs and discussed the procedure including the risks, benefits and alternatives for the proposed anesthesia with the patient or authorized representative who has indicated his/her understanding and acceptance.   Dental advisory given  Plan Discussed with: CRNA and Surgeon  Anesthesia Plan Comments:        Anesthesia Quick Evaluation

## 2014-02-12 NOTE — H&P (View-Only) (Signed)
HPI:  [name] returns today to discuss results for PET CT.  She is a 73 year old woman with a chief complaint of a persistent cough. She has been followed by Dr. Melvyn Novas who has treated her medically. She did have a CT scan as part of her workup, and it showed multiple nodules and scattered groundglass densities in both lungs.  On her CT in January she had a dominant groundglass opacity in the right lower lobe. On followup in April the size of the lesion was not significantly different but there was more of a solid component within the septal solid nodule. She then had a repeat CT in July which once again showed an increase in the size of the central solid component to this lesion. It was now 14 mm.   I recommended surgical resection, but wanted to see a PET CT to make sure there were no other suspicious findings given that she had multiple nodules bilaterally. She now has had that scan done in returns to discuss the results.  Past Medical History  Diagnosis Date  . Osteopenia   . Hypertriglyceridemia   . Depression with anxiety   . GERD (gastroesophageal reflux disease)   . OA (osteoarthritis)   . Colon polyp   . Atrophic vaginitis   . Kidney stones   . Contact lens/glasses fitting     wears contacts or glasses  . Asthma        Current Outpatient Prescriptions  Medication Sig Dispense Refill  . albuterol (PROAIR HFA) 108 (90 BASE) MCG/ACT inhaler Inhale 2 puffs into the lungs every 6 (six) hours as needed for wheezing or shortness of breath.  1 Inhaler  4  . Calcium-Magnesium-Vitamin D (CALCIUM 1200+D3 PO) Take 1 tablet by mouth at bedtime.      . chlorpheniramine (CHLOR-TRIMETON) 4 MG tablet Take 4 mg by mouth at bedtime.      . cycloSPORINE (RESTASIS) 0.05 % ophthalmic emulsion Place 1 drop into both eyes 2 (two) times daily.      . famotidine (PEPCID) 20 MG tablet Take 20 mg by mouth at bedtime.       . fenofibrate 160 MG tablet Take 160 mg by mouth daily.      Marland Kitchen  FLUoxetine (PROZAC) 20 MG capsule Take 40 mg by mouth daily.       . montelukast (SINGULAIR) 10 MG tablet Take 10 mg by mouth at bedtime.      . montelukast (SINGULAIR) 10 MG tablet TAKE 1 TABLET AT BEDTIME NIGHTLY  30 tablet  2  . Multiple Vitamins-Minerals (CENTRUM SILVER PO) Take 1 tablet by mouth daily.      Marland Kitchen omeprazole (PRILOSEC) 20 MG capsule Take 40 mg by mouth daily.       Marland Kitchen Respiratory Therapy Supplies (FLUTTER) DEVI Use as directed  1 each  0  . solifenacin (VESICARE) 10 MG tablet Take 10 mg by mouth daily.      . traZODone (DESYREL) 50 MG tablet Take 50 mg by mouth at bedtime.       No current facility-administered medications for this visit.    Physical Exam BP 143/76  Pulse 77  Ht 5\' 4"  (1.626 m)  Wt 199 lb (90.266 kg)  BMI 34.14 kg/m2  SpO2 44% Obese 73 year old woman in no acute distress Alert and oriented x3 no focal deficits Lungs clear  Diagnostic Tests: NUCLEAR MEDICINE PET SKULL BASE TO THIGH  TECHNIQUE:  10.9 mCi F-18 FDG was injected intravenously. Full-ring PET imaging  was  performed from the skull base to thigh after the radiotracer. CT  data was obtained and used for attenuation correction and anatomic  localization.  FASTING BLOOD GLUCOSE: Value: 95 mg/dl  COMPARISON: None.  FINDINGS:  NECK  No hypermetabolic lymph nodes in the neck.  CHEST  No hypermetabolic mediastinal or hilar nodes. Calcified  atherosclerotic disease involves the thoracic aorta. Sub solid  nodule within the superior segment of right lower lobe measures 1.8  cm and is unchanged from previous exam. The SUV max associated with  this nodule is equal to 1.17. Multiple small solid-appearing nodules  are identified within both lungs as seen previously. These are too  small to characterize by PET-CT No suspicious pulmonary nodules on  the CT scan.  ABDOMEN/PELVIS  No abnormal hypermetabolic activity within the liver, pancreas,  adrenal glands, or spleen. No hypermetabolic lymph  nodes in the  abdomen or pelvis. Stone identified within the gallbladder.  SKELETON  No focal hypermetabolic activity to suggest skeletal metastasis.  IMPRESSION:  1. Sub solid nodule within the superior segment of right lower lobe  has an SUV max equal to 1.17. Although this is below the malignant  range pulmonary adenocarcinoma is may be false negative on PET-CT.  Biopsy or surgical resection should be considered. This  recommendation follows the consensus statement: Recommendations for  the Management of Subsolid Pulmonary Nodules Detected at CT: A  Statement from the [place]. Radiology 7253;664:403-474.  2. Other small sub cm nodules  Electronically Signed  By: Kerby Moors M.D.  On: 02/05/2014 08:50   CT CHEST WITHOUT CONTRAST  TECHNIQUE:  Multidetector CT imaging of the chest was performed following the  standard protocol without IV contrast.  COMPARISON: Chest CTs dated 10/10/2013 and 07/14/2013. Abdominal CT  03/21/2008.  FINDINGS:  The mediastinum has a stable appearance without evidence of  adenopathy. There is stable mild atherosclerosis of the aorta and  coronary arteries. Mitral annular calcifications are noted.  There is stable nodularity of the thyroid gland and a stable hiatal  hernia. No significant pleural or pericardial effusion is present.  There is a stable soft tissue nodule superiorly in the right breast  on image 27.  Again demonstrated are numerous small pulmonary nodules and  scattered ground-glass densities. Dominant sub solid nodule in the  superior segment of the right lower lobe measures 2.2 x 1.5 cm on  image 27. This has a central solid component measuring at least 14  mm on sagittal image 25, suspicious for adenocarcinoma. No new or  enlarging nodules are identified.  The visualized upper abdomen has a stable appearance. There is no  evidence of adrenal mass or suspicious osseous finding.  IMPRESSION:  1. Persistent sub solid nodule  in the superior segment of the right  lower lobe. Adenocarcinoma is considered. Thoracic surgery  consultation is recommended. PET/CT should be considered for staging  purposes.These recommendations are taken from "Recommendations for  the Management of Subsolid Pulmonary Nodules Detected at CT: A  Statement from the [place]" Radiology 2013; 266:1,  304-317.  2. Additional smaller ground-glass densities and pulmonary nodules  bilaterally are unchanged, nonspecific.  3. No evidence of adenopathy or pleural effusion.  Electronically Signed  By: Camie Patience M.D.  On: 01/09/2014 13:37  Impression:  73 year old woman with a persistent cough who has multiple lung nodules bilaterally. There is a dominant nodule in the right lower lobe that measures 2.2 x 1.5 cm that has a soft tissue component in the setting of a groundglass  opacity. This is highly suspicious for a low-grade adenocarcinoma. This was not within malignant range on PET CT. However, low-grade adenocarcinomas can often be falsely negative on the PET. Fortunately the PET CT did not show any other findings of concern. Therefore, I would continue with my original recommendation which is to resect the right lower lobe nodule for definitive diagnosis.  We had previously discussed the indications, risks, benefits, and alternatives at her previous visit. We once again reviewed the operative plan including the intraoperative decision making. She had questions about her return to activities once discharged. I answered those as best I could, but emphasized the large to be determined by how she is doing postoperatively.  We're planning to proceed with a right video-assisted thoracoscopy, wedge resection of right lower lobe nodule, possible segmentectomy or lobectomy and cryo- analgesia of intercostal nerves on Monday, August 10. She understands the risks include, but are not limited to death, MI, DVT, PE, bleeding, possible need for  transfusion, infection, air leak, cardiac arrhythmias, as well as the possibility of unforeseeable complications.  She asks that I discuss her medical results and issues with her son and daughter as well as her husband.  Plan: Right VATS, wedge resection, possible segmentectomy or lobectomy, cryo-analgesia of intercostal nerves on Monday, August 10.

## 2014-02-12 NOTE — Anesthesia Postprocedure Evaluation (Signed)
Anesthesia Post Note  Patient: [name]  Procedure(s) Performed: Procedure(s) (LRB): RIGHT VIDEO ASSISTED THORACOSCOPY RIGHT LOWER LOBE LUNG /WEDGE RESECTION,RIGHT THORACOTOMY WITH RIGHT LOWER LOBE LOBECTOMY & NODE DISSECTION (Right) VIDEO BRONCHOSCOPY (N/A)  Anesthesia type: general  Patient location: PACU  Post pain: Pain level controlled  Post assessment: Patient's Cardiovascular Status Stable  Last Vitals:  Filed Vitals:   02/12/14 1900  BP: 119/55  Pulse: 78  Temp:   Resp: 20    Post vital signs: Reviewed and stable  Level of consciousness: sedated  Complications: No apparent anesthesia complications

## 2014-02-12 NOTE — Anesthesia Procedure Notes (Addendum)
Procedure Name: Intubation Date/Time: 02/12/2014 2:05 PM Performed by: Blair Heys E Pre-anesthesia Checklist: Patient identified, Emergency Drugs available, Suction available and Patient being monitored Patient Re-evaluated:Patient Re-evaluated prior to inductionOxygen Delivery Method: Circle system utilized Preoxygenation: Pre-oxygenation with 100% oxygen Intubation Type: IV induction Ventilation: Mask ventilation without difficulty Laryngoscope Size: Miller and 2 Grade View: Grade III Tube type: Oral Endobronchial tube: Double lumen EBT and 37 Fr Tube size: 7.5 mm Number of attempts: 3 Airway Equipment and Method: Stylet and Video-laryngoscopy Placement Confirmation: ETT inserted through vocal cords under direct vision,  positive ETCO2 and breath sounds checked- equal and bilateral Tube secured with: Tape Dental Injury: Teeth and Oropharynx as per pre-operative assessment  Difficulty Due To: Difficulty was unanticipated, Difficult Airway- due to anterior larynx and Difficult Airway- due to limited oral opening Comments: Small airway c difficult laryngoscopy. Initial attempt at double lumen placement without success. Multiple attempts at manipulation and tube rotation did not allow [place] to advance tube into appropriate poition. Patient was then reintubated c # 7.5 ETT and a bronchial blocker was passed under direct vision c surgeon in attendance. Patient was easy mask ventilation.

## 2014-02-12 NOTE — Brief Op Note (Addendum)
02/12/2014  5:15 PM  PATIENT:  Benay Pillow  73 y.o. female  PRE-OPERATIVE DIAGNOSIS:  RIGHT LOWER LOBE LUNG NODULE  POST-OPERATIVE DIAGNOSIS:  RIGHT LOWER LOBE LUNG CANCER  PROCEDURE:  Procedure(s):  VIDEO BRONCHOSCOPY   RIGHT MINI THORACOTOMY -Right Lower Lobe Wedge Resection -Right Lower Lobectomy -Lymph Node Sampling -Insertion of On-Q Local Anesthetic catheter  SURGEON:  Surgeon(s) and Role:    * Melrose Nakayama, MD - Primary  PHYSICIAN ASSISTANT: Ellwood Handler PA-C  ANESTHESIA:   general  EBL:  Total I/O In: 1900 [I.V.:1900] Out: 625 [Urine:425; Blood:200]  BLOOD ADMINISTERED:none  DRAINS: 32 Blake Drain, 32 Straight Chest Tube Right Chest   LOCAL MEDICATIONS USED:  MARCAINE     SPECIMEN:  Source of Specimen:  Right Lower Lobe Wedge Resection, Remaining portion of right lower lobe, Lymph Node level 7,10,11  DISPOSITION OF SPECIMEN:  PATHOLOGY  COUNTS:  YES  PLAN OF CARE: Admit to inpatient   PATIENT DISPOSITION:  PACU - hemodynamically stable.   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 Procedure done with intermittent ventilation

## 2014-02-12 NOTE — Interval H&P Note (Signed)
History and Physical Interval Note:  02/12/2014 1:03 PM  [name]  has presented today for surgery, with the diagnosis of RIGHT LOWER LOBE LUNG NODULE  The various methods of treatment have been discussed with the patient and family. After consideration of risks, benefits and other options for treatment, the patient has consented to  Procedure(s): VIDEO ASSISTED THORACOSCOPY (VATS)/WEDGE RESECTION-RLL (Right) POSSIBLE SEGMENTECTOMY OR LOBECTOMY (Right) CRYO INTERCOSTAL NERVE BLOCK (Right) as a surgical intervention .  The patient's history has been reviewed, patient examined, no change in status, stable for surgery.  I have reviewed the patient's chart and labs.  Questions were answered to the patient's satisfaction.     Donyetta Ogletree C

## 2014-02-13 ENCOUNTER — Inpatient Hospital Stay (HOSPITAL_COMMUNITY): Payer: Medicare Other

## 2014-02-13 ENCOUNTER — Encounter (HOSPITAL_COMMUNITY): Payer: Self-pay | Admitting: *Deleted

## 2014-02-13 LAB — CBC
HCT: 32.3 % — ABNORMAL LOW (ref 36.0–46.0)
Hemoglobin: 10.6 g/dL — ABNORMAL LOW (ref 12.0–15.0)
MCH: 30.4 pg (ref 26.0–34.0)
MCHC: 32.8 g/dL (ref 30.0–36.0)
MCV: 92.6 fL (ref 78.0–100.0)
Platelets: 280 10*3/uL (ref 150–400)
RBC: 3.49 MIL/uL — ABNORMAL LOW (ref 3.87–5.11)
RDW: 13.5 % (ref 11.5–15.5)
WBC: 11.1 10*3/uL — ABNORMAL HIGH (ref 4.0–10.5)

## 2014-02-13 LAB — BASIC METABOLIC PANEL
Anion gap: 11 (ref 5–15)
BUN: 17 mg/dL (ref 6–23)
CO2: 24 mEq/L (ref 19–32)
Calcium: 8.5 mg/dL (ref 8.4–10.5)
Chloride: 105 mEq/L (ref 96–112)
Creatinine, Ser: 0.86 mg/dL (ref 0.50–1.10)
GFR calc Af Amer: 76 mL/min — ABNORMAL LOW (ref >90)
GFR calc non Af Amer: 65 mL/min — ABNORMAL LOW (ref >90)
Glucose, Bld: 155 mg/dL — ABNORMAL HIGH (ref 70–99)
Potassium: 4.4 mEq/L (ref 3.7–5.3)
Sodium: 140 mEq/L (ref 137–147)

## 2014-02-13 MED ORDER — ENOXAPARIN SODIUM 40 MG/0.4ML ~~LOC~~ SOLN
40.0000 mg | SUBCUTANEOUS | Status: DC
  Administered 2014-02-13 – 2014-02-17 (×5): 40 mg via SUBCUTANEOUS
  Filled 2014-02-13 (×5): qty 0.4

## 2014-02-13 MED ORDER — ZOLPIDEM TARTRATE 5 MG PO TABS: 5.0000 mg | ORAL_TABLET | Freq: Every evening | ORAL | Status: DC | PRN

## 2014-02-13 MED ORDER — DIPHENHYDRAMINE HCL 25 MG PO TABS: 25.0000 mg | ORAL_TABLET | Freq: Every evening | ORAL | Status: DC | PRN

## 2014-02-13 MED ORDER — TRAZODONE HCL 50 MG PO TABS
50.0000 mg | ORAL_TABLET | Freq: Every day | ORAL | Status: DC
  Administered 2014-02-13 – 2014-02-16 (×4): 50 mg via ORAL
  Filled 2014-02-13 (×5): qty 1

## 2014-02-13 MED ORDER — PNEUMOCOCCAL VAC POLYVALENT 25 MCG/0.5ML IJ INJ
0.5000 mL | INJECTION | INTRAMUSCULAR | Status: DC
  Filled 2014-02-13: qty 0.5

## 2014-02-13 NOTE — Progress Notes (Signed)
Utilization review completed.  

## 2014-02-13 NOTE — Op Note (Signed)
[name], [name]            ACCOUNT NO.:  [id number]  MEDICAL RECORD NO.:  [id number]  LOCATION:  3S03C                        FACILITY:  [facility]  PHYSICIAN:  Revonda Standard. Roxan Hockey, M.D.DATE OF BIRTH:  12/11/1940  DATE OF PROCEDURE:  02/12/2014 DATE OF DISCHARGE:                              OPERATIVE REPORT   PREOPERATIVE DIAGNOSIS:  Right lower lobe nodule.  POSTOPERATIVE DIAGNOSIS:  Right lower lobe lung cancer, clinical stage IA.  PROCEDURE:  Right mini thoracotomy.  Wedge resection, right lower lobe Right lower lobectomy Limited lymph node sampling ON-Q local anesthetic catheter placement.  SURGEON:  Revonda Standard. Roxan Hockey, M.D.  ASSISTANT:  Ellwood Handler, MD  ANESTHESIA:  General.  FINDINGS:  Unable to successfully place the double-lumen tube or bronchial blocker.  Entire procedure performed with intermittent ventilation.  A 2 cm mass in the right lower lobe along the major fissure roughly at the junction of the superior segment and lateral basilar segment. 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  Initial margin was positive, therefore, right lower lobectomy performed.  CLINICAL NOTE:  [name] is a 73 year old woman, who presented with a chief complaint of persistent cough.  She had multiple nodules and scattered ground-glass opacities on CT scan.  Over the past 6 months, a dominant nodule in the right lower lobe had increased in density and the central solid component had increased in size.  A PET-CT was unremarkable, however, she was advised to undergo wedge resection for definitive diagnosis as well as a possible segmentectomy or lobectomy, if the frozen section were positive.  The indications risks, benefits, and alternatives were discussed in detail with the patient.  She understood and accepted the risks and agreed to proceed.  OPERATIVE NOTE:  [name] was brought to the preoperative  holding area on February 12, 2014.  There Anesthesia placed a central line and an arterial blood pressure monitoring line.  She was taken to the operating room and anesthetized and intubated with a double-lumen endotracheal tube.  Dr. Orene Desanctis was unable to get satisfactory placement of the double-lumen tube so that single lung ventilation could be carried out. Ultimately, this was removed and replaced with a single-lumen tube. Attempts to place a bronchial blocker resulted in both balloons being in the right mainstem and decision made to proceed hoping that this right mainstem occlusion would be adequate.  A Foley catheter was placed. Sequential compressive devices were placed on the legs for DVT prophylaxis.  She was placed in a left lateral decubitus position and the right chest was prepped and draped in usual sterile fashion.  An incision was made in the midaxillary line in approximately the seventh intercostal space.  The port was inserted and advanced into the chest. It was clear that there was ongoing ventilation of the right lung.  An anterolateral mini thoracotomy was performed in approximately the fourth interspace anterolaterally.  A small retractor was placed.  The ribs were spread slightly, but the ribs did not have to be cut. Additional attempts to get the bronchial blocker to function appropriately were unsuccessful and decision was made to proceed with intermittent ventilation.  The incision was right over the major fissure and the  mass was clearly palpable in the lower lobe adjacent to the major fissure.  A wedge resection was performed with sequential firings of an endoscopic GIA stapler.  The specimen was removed and sent for frozen section.  This returned showing non-small cell carcinoma with the margin involved.  There was no way to get an additional, adequate margin without compromise of the pulmonary artery branches and decision was made to proceed with a lobectomy.   It was not felt a segmentectomy would be possible under these circumstances.  The pleural reflection was divided at the hilum both medially and laterally.  The inferior ligament was divided.  There was no significant node encountered.  The fissures were nearly complete, but the remaining tissue overlying the lower lobe pulmonary arterial branches was dissected off and a stapler was used for 1 small segment of lung which crossed the PA branches.  Again all this was done working intermittently and intermittently ventilating the patient. Anesthesia monitored the O2 stats and end-tidal CO2 throughout this procedure.  Nodes that were encountered during the dissection of the vessels were sent as separate specimens.  The basilar and superior segmental branches of the right lower lobe pulmonary artery were encircled and divided with an endoscopic vascular stapler.  Next, the inferior pulmonary vein was encircled and divided with an endoscopic vascular stapler.  The remainder of the major fissure between the lower and middle lobes was completed with an endoscopic GIA stapler and the lower lobe bronchus was identified and isolated. A stapler was placed across the bronchus. With a test inflation there was good aeration of the upper and middle lobes.  The stapler was fired dividing the bronchus and the remainder of the left lower lobe was removed and sent for permanent pathology.  Inspection was made for hemostasis.  There was a relatively prominent level 10 node that was dissected out, as well as one level 7 node.  Additional lymph node dissection was not possible due to the need for intermittent ventilation and limited exposure.  An ON-Q local anesthetic catheter was placed through a separate stab wound posteriorly and tunneled in a subpleural location.  This was done with thoracoscopic visualization.  A 32-French Blake drain was placed through the original port and directed posteriorly.  A  28-French chest tube was placed through the second port incision and directed anteriorly into the apex.  The chest was copiously irrigated with warm saline.  A test inflation at 30 cm water revealed no leakage from the bronchial stump.  The middle lobe was tacked to the upper lobe with a stapler.  The retractor was removed.  The muscular fascia was closed with running #1 Vicryl suture.  The subcutaneous tissue and skin were closed in standard fashion.  All sponge, needle, and instrument counts were correct at the end of the procedure.  The patient was taken from the operating room to the postanesthetic care unit extubated in good condition.     Revonda Standard Roxan Hockey, M.D.     SCH/MEDQ  D:  02/12/2014  T:  02/13/2014  Job:  [id number]

## 2014-02-13 NOTE — Progress Notes (Signed)
1 Day Post-Op Procedure(s) (LRB): RIGHT VIDEO ASSISTED THORACOSCOPY RIGHT LOWER LOBE LUNG /WEDGE RESECTION,RIGHT THORACOTOMY WITH RIGHT LOWER LOBE LOBECTOMY & NODE DISSECTION (Right) VIDEO BRONCHOSCOPY (N/A) Subjective: Some incisional pain, denies nausea  Objective: Vital signs in last 24 hours: Temp:  [97.1 F (36.2 C)-99.1 F (37.3 C)] 97.6 F (36.4 C) (08/11 0746) Pulse Rate:  [60-81] 77 (08/11 0400) Cardiac Rhythm:  [-] Normal sinus rhythm (08/11 0400) Resp:  [17-29] 23 (08/11 0800) BP: (102-152)/(52-88) 117/53 mmHg (08/11 0400) SpO2:  [91 %-100 %] 100 % (08/11 0800) Arterial Line BP: (133-153)/(50-65) 134/53 mmHg (08/11 0400) Weight:  [203 lb (92.08 kg)-205 lb 14.6 oz (93.4 kg)] 205 lb 14.6 oz (93.4 kg) (08/10 1957)  Hemodynamic parameters for last 24 hours:    Intake/Output from previous day: 08/10 0701 - 08/11 0700 In: 3140 [P.O.:240; I.V.:2900] Out: 1375 [Urine:775; Blood:200; Chest Tube:400] Intake/Output this shift: Total I/O In: -  Out: 75 [Urine:75]  General appearance: alert and no distress Neurologic: intact Heart: regular rate and rhythm Lungs: diminished breath sounds right base Abdomen: normal findings: soft, non-tender no air leak, serosanguinous drainage  Lab Results:  Recent Labs  02/13/14 0440  WBC 11.1*  HGB 10.6*  HCT 32.3*  PLT 280   BMET:  Recent Labs  02/13/14 0440  NA 140  K 4.4  CL 105  CO2 24  GLUCOSE 155*  BUN 17  CREATININE 0.86  CALCIUM 8.5    PT/INR: No results found for this basename: LABPROT, INR,  in the last 72 hours ABG    Component Value Date/Time   PHART 7.410 02/08/2014 1243   HCO3 23.7 02/08/2014 1243   TCO2 24.9 02/08/2014 1243   ACIDBASEDEF 0.3 02/08/2014 1243   O2SAT 95.6 02/08/2014 1243   CBG (last 3)  No results found for this basename: GLUCAP,  in the last 72 hours  Assessment/Plan: S/P Procedure(s) (LRB): RIGHT VIDEO ASSISTED THORACOSCOPY RIGHT LOWER LOBE LUNG /WEDGE RESECTION,RIGHT THORACOTOMY WITH  RIGHT LOWER LOBE LOBECTOMY & NODE DISSECTION (Right) VIDEO BRONCHOSCOPY (N/A) POD # 1 Right lower lobectomy No air leak- CT to water seal  Clears, advance diet as tolerated  Informed patient of frozen section showing cancer- await final path  OOB, ambulate  SCD + enoxaparin for DVT prophylaxis   LOS: 1 day    [name] C 02/13/2014

## 2014-02-14 ENCOUNTER — Inpatient Hospital Stay (HOSPITAL_COMMUNITY): Payer: Medicare Other

## 2014-02-14 LAB — CBC
HCT: 32.2 % — ABNORMAL LOW (ref 36.0–46.0)
Hemoglobin: 10.5 g/dL — ABNORMAL LOW (ref 12.0–15.0)
MCH: 30.3 pg (ref 26.0–34.0)
MCHC: 32.6 g/dL (ref 30.0–36.0)
MCV: 92.8 fL (ref 78.0–100.0)
Platelets: 281 10*3/uL (ref 150–400)
RBC: 3.47 MIL/uL — ABNORMAL LOW (ref 3.87–5.11)
RDW: 13.8 % (ref 11.5–15.5)
WBC: 10.4 10*3/uL (ref 4.0–10.5)

## 2014-02-14 LAB — COMPREHENSIVE METABOLIC PANEL
ALT: 23 U/L (ref 0–35)
AST: 50 U/L — ABNORMAL HIGH (ref 0–37)
Albumin: 2.8 g/dL — ABNORMAL LOW (ref 3.5–5.2)
Alkaline Phosphatase: 36 U/L — ABNORMAL LOW (ref 39–117)
Anion gap: 12 (ref 5–15)
BUN: 11 mg/dL (ref 6–23)
CO2: 23 mEq/L (ref 19–32)
Calcium: 8.5 mg/dL (ref 8.4–10.5)
Chloride: 105 mEq/L (ref 96–112)
Creatinine, Ser: 0.76 mg/dL (ref 0.50–1.10)
GFR calc Af Amer: >90 mL/min (ref >90)
GFR calc non Af Amer: 82 mL/min — ABNORMAL LOW (ref >90)
Glucose, Bld: 100 mg/dL — ABNORMAL HIGH (ref 70–99)
Potassium: 4.5 mEq/L (ref 3.7–5.3)
Sodium: 140 mEq/L (ref 137–147)
Total Bilirubin: 0.3 mg/dL (ref 0.3–1.2)
Total Protein: 6.1 g/dL (ref 6.0–8.3)

## 2014-02-14 LAB — GLUCOSE, CAPILLARY: Glucose-Capillary: 114 mg/dL — ABNORMAL HIGH (ref 70–99)

## 2014-02-14 NOTE — Progress Notes (Addendum)
  [address]       [place] [address]             [phone]      2 Days Post-Op Procedure(s) (LRB): RIGHT VIDEO ASSISTED THORACOSCOPY RIGHT LOWER LOBE LUNG /WEDGE RESECTION,RIGHT THORACOTOMY WITH RIGHT LOWER LOBE LOBECTOMY & NODE DISSECTION (Right) VIDEO BRONCHOSCOPY (N/A)  Subjective:  Ms. Oguin states she is tired this morning.  States she did not sleep well and the pain medication is making her drowsy.  She states she has no ambulated since surgery and she was encouraged to do this today.  Objective: Vital signs in last 24 hours: Temp:  [97.6 F (36.4 C)-98.3 F (36.8 C)] 97.6 F (36.4 C) (08/12 0731) Pulse Rate:  [71-76] 75 (08/12 0432) Cardiac Rhythm:  [-] Normal sinus rhythm (08/12 0755) Resp:  [18-26] 20 (08/12 0800) BP: (111-157)/(48-74) 151/74 mmHg (08/12 0432) SpO2:  [93 %-98 %] 93 % (08/12 0821) Arterial Line BP: (132-183)/(53-79) 183/79 mmHg (08/12 0432)  Intake/Output from previous day: 08/11 0701 - 08/12 0700 In: 680.8 [I.V.:680.8] Out: 2480 [Urine:1900; Chest Tube:580] Intake/Output this shift: Total I/O In: -  Out: 60 [Chest Tube:60]  General appearance: alert, cooperative and no distress Heart: regular rate and rhythm Lungs: clear to auscultation bilaterally Abdomen: soft, non-tender; bowel sounds normal; no masses,  no organomegaly Wound: clean and dry  Lab Results:  Recent Labs  02/13/14 0440 02/14/14 0450  WBC 11.1* 10.4  HGB 10.6* 10.5*  HCT 32.3* 32.2*  PLT 280 281   BMET:  Recent Labs  02/13/14 0440 02/14/14 0450  NA 140 140  K 4.4 4.5  CL 105 105  CO2 24 23  GLUCOSE 155* 100*  BUN 17 11  CREATININE 0.86 0.76  CALCIUM 8.5 8.5    PT/INR: No results found for this basename: LABPROT, INR,  in the last 72 hours ABG    Component Value Date/Time   PHART 7.410 02/08/2014 1243   HCO3 23.7 02/08/2014 1243   TCO2 24.9 02/08/2014 1243   ACIDBASEDEF 0.3 02/08/2014 1243   O2SAT 95.6 02/08/2014 1243   CBG (last 3)   No results found for this basename: GLUCAP,  in the last 72 hours  Assessment/Plan: S/P Procedure(s) (LRB): RIGHT VIDEO ASSISTED THORACOSCOPY RIGHT LOWER LOBE LUNG /WEDGE RESECTION,RIGHT THORACOTOMY WITH RIGHT LOWER LOBE LOBECTOMY & NODE DISSECTION (Right) VIDEO BRONCHOSCOPY (N/A)  1. Chest tube- 580 cc output yesterday, + air leak with cough, CXR without pneumothorax, + atelectasis 2. Pulm- wean oxygen as tolerated, will continue Pulm Toilet, add flutter valve 3. CV- Hypertensive- will monitor if no improvement will add antihypertensive 4. D/C Arterial Line 5. GI- tolerating diet, no nausea, will place IV Fluids to KVO 6. Dispo- patient stable, + air leak with increased output will leave chest tube on water seal, patient needs to get out of bed and ambulate   LOS: 2 days    Ahmed Prima, ERIN 02/14/2014   patient patient and chest x-ray reviewed  Agree with above assessment and plan to leave chest tube in place  Repeat chest x-ray in a.m.

## 2014-02-15 ENCOUNTER — Encounter (HOSPITAL_COMMUNITY): Payer: Self-pay | Admitting: Thoracic Surgery (Cardiothoracic Vascular Surgery)

## 2014-02-15 ENCOUNTER — Inpatient Hospital Stay (HOSPITAL_COMMUNITY): Payer: Medicare Other

## 2014-02-15 NOTE — Care Management Note (Signed)
    Page 1 of 1   02/15/2014     1:53:10 PM CARE MANAGEMENT NOTE 02/15/2014  Patient:  [name], [name]   Account Number:  [id number]  Date Initiated:  02/13/2014  Documentation initiated by:  Marvetta Gibbons  Subjective/Objective Assessment:   Pt admitted s/p VATS     Action/Plan:   PTA pt lived at home with spouse   Anticipated DC Date:  02/17/2014   Anticipated DC Plan:  HOME/SELF CARE         Choice offered to / List presented to:             Status of service:  In process, will continue to follow Medicare Important Message given?  YES (If response is "NO", the following Medicare IM given date fields will be blank) Date Medicare IM given:  02/15/2014 Medicare IM given by:  Marvetta Gibbons Date Additional Medicare IM given:   Additional Medicare IM given by:    Discharge Disposition:    Per UR Regulation:  Reviewed for med. necessity/level of care/duration of stay  If discussed at [facility] of Stay Meetings, dates discussed:    Comments:

## 2014-02-15 NOTE — Progress Notes (Signed)
3 Days Post-Op Procedure(s) (LRB): RIGHT VIDEO ASSISTED THORACOSCOPY RIGHT LOWER LOBE LUNG /WEDGE RESECTION,RIGHT THORACOTOMY WITH RIGHT LOWER LOBE LOBECTOMY & NODE DISSECTION (Right) VIDEO BRONCHOSCOPY (N/A) Subjective: Some incisional discomfort Denies nausea  Objective: Vital signs in last 24 hours: Temp:  [97.6 F (36.4 C)-98.6 F (37 C)] 97.7 F (36.5 C) (08/13 0750) Pulse Rate:  [75-84] 77 (08/13 0804) Cardiac Rhythm:  [-] Normal sinus rhythm (08/13 0750) Resp:  [19-27] 22 (08/13 0804) BP: (156-184)/(52-82) 167/82 mmHg (08/13 0804) SpO2:  [92 %-96 %] 94 % (08/13 0800) Arterial Line BP: (190-191)/(82-83) 190/83 mmHg (08/12 1157)  Hemodynamic parameters for last 24 hours:    Intake/Output from previous day: 08/12 0701 - 08/13 0700 In: 360 [P.O.:240; I.V.:120] Out: 1680 [Urine:1250; Chest Tube:430] Intake/Output this shift: Total I/O In: -  Out: 10 [Chest Tube:10]  General appearance: alert and no distress Neurologic: intact Heart: regular rate and rhythm Lungs: diminished breath sounds right base Abdomen: normal findings: soft, non-tender no air leak, serous drainage from CT  Lab Results:  Recent Labs  02/13/14 0440 02/14/14 0450  WBC 11.1* 10.4  HGB 10.6* 10.5*  HCT 32.3* 32.2*  PLT 280 281   BMET:  Recent Labs  02/13/14 0440 02/14/14 0450  NA 140 140  K 4.4 4.5  CL 105 105  CO2 24 23  GLUCOSE 155* 100*  BUN 17 11  CREATININE 0.86 0.76  CALCIUM 8.5 8.5    PT/INR: No results found for this basename: LABPROT, INR,  in the last 72 hours ABG    Component Value Date/Time   PHART 7.410 02/08/2014 1243   HCO3 23.7 02/08/2014 1243   TCO2 24.9 02/08/2014 1243   ACIDBASEDEF 0.3 02/08/2014 1243   O2SAT 95.6 02/08/2014 1243   CBG (last 3)   Recent Labs  02/14/14 1551  GLUCAP 114*    Assessment/Plan: S/P Procedure(s) (LRB): RIGHT VIDEO ASSISTED THORACOSCOPY RIGHT LOWER LOBE LUNG /WEDGE RESECTION,RIGHT THORACOTOMY WITH RIGHT LOWER LOBE LOBECTOMY & NODE  DISSECTION (Right) VIDEO BRONCHOSCOPY (N/A) - Doing well  Air leak resolved- dc anterior CT  430 ml from tubes last 24 hours- leave posterior tube in place for now  Ambulating  SCD + enoxparin for DVT prophylaxis  Path T1,N0 stage IA adenocarcinoma- patient informed   LOS: 3 days    Emile Kyllo C 02/15/2014

## 2014-02-15 NOTE — Progress Notes (Signed)
Anterior chest tube and on-q removed per md order. Removed by brittany,rn, policies and procedures were followed and pt tolerated procedure well. Will continue to monitor.

## 2014-02-16 ENCOUNTER — Inpatient Hospital Stay (HOSPITAL_COMMUNITY): Payer: Medicare Other

## 2014-02-16 NOTE — Progress Notes (Signed)
4 Days Post-Op Procedure(s) (LRB): RIGHT VIDEO ASSISTED THORACOSCOPY RIGHT LOWER LOBE LUNG /WEDGE RESECTION,RIGHT THORACOTOMY WITH RIGHT LOWER LOBE LOBECTOMY & NODE DISSECTION (Right) VIDEO BRONCHOSCOPY (N/A) Subjective: Some incisional pain, controlled with PCA  Objective: Vital signs in last 24 hours: Temp:  [97.6 F (36.4 C)-98.4 F (36.9 C)] 98.2 F (36.8 C) (08/14 0802) Pulse Rate:  [69-77] 69 (08/14 0413) Cardiac Rhythm:  [-] Normal sinus rhythm (08/14 0413) Resp:  [14-24] 14 (08/14 0800) BP: (137-156)/(57-70) 146/60 mmHg (08/14 0413) SpO2:  [92 %-95 %] 94 % (08/14 0800)  Hemodynamic parameters for last 24 hours:    Intake/Output from previous day: 08/13 0701 - 08/14 0700 In: 323 [I.V.:323] Out: 1782 [Urine:1551; Stool:1; Chest Tube:230] Intake/Output this shift:    General appearance: alert and no distress Neurologic: intact Heart: regular rate and rhythm Lungs: diminished breath sounds right base Wound: [place] dry no air leak, serous draiange from tube  Lab Results:  Recent Labs  02/14/14 0450  WBC 10.4  HGB 10.5*  HCT 32.2*  PLT 281   BMET:  Recent Labs  02/14/14 0450  NA 140  K 4.5  CL 105  CO2 23  GLUCOSE 100*  BUN 11  CREATININE 0.76  CALCIUM 8.5    PT/INR: No results found for this basename: LABPROT, INR,  in the last 72 hours ABG    Component Value Date/Time   PHART 7.410 02/08/2014 1243   HCO3 23.7 02/08/2014 1243   TCO2 24.9 02/08/2014 1243   ACIDBASEDEF 0.3 02/08/2014 1243   O2SAT 95.6 02/08/2014 1243   CBG (last 3)   Recent Labs  02/14/14 1551  GLUCAP 114*    Assessment/Plan: S/P Procedure(s) (LRB): RIGHT VIDEO ASSISTED THORACOSCOPY RIGHT LOWER LOBE LUNG /WEDGE RESECTION,RIGHT THORACOTOMY WITH RIGHT LOWER LOBE LOBECTOMY & NODE DISSECTION (Right) VIDEO BRONCHOSCOPY (N/A) - Doing well  No air leak and drainage down- dc chest tube  Plan home in AM if no issues arise   LOS: 4 days    Casandra Dallaire C 02/16/2014

## 2014-02-16 NOTE — Progress Notes (Signed)
Utilization review completed.  

## 2014-02-16 NOTE — Discharge Instructions (Signed)
Thoracotomy, Care After Refer to this sheet in the next few weeks. These instructions provide you with information on caring for yourself after your procedure. Your health care provider may also give you more specific instructions. Your treatment has been planned according to current medical practices, but problems sometimes occur. Call your health care provider if you have any problems or questions after your procedure. WHAT TO EXPECT AFTER YOUR PROCEDURE After your procedure, it is typical to have the following sensations:  You may feel pain at the incision site.  You may be constipated from the pain medicine given and the change in your level of activity.  You may feel extremely tired. HOME CARE INSTRUCTIONS  Take over-the-counter or prescription medicines for pain, discomfort, or fever only as directed by your health care provider. It is very important to take pain relieving medicine before your pain becomes severe. You will be able to breathe and cough more comfortably if your pain is well controlled.  Take deep breaths. Deep breathing helps to keep your lungs inflated and protects against a lung infection (pneumonia).  Cough frequently. Even though coughing may cause discomfort, coughing is important to clear mucus (phlegm) and expand your lungs. Coughing helps prevent pneumonia. If it hurts to cough, hold a pillow against your chest when you cough. This may help with the discomfort.  Continue to use an incentive spirometer as directed. The use of an incentive spirometer helps to keep your lungs inflated and protects against pneumonia.  Change the bandages over your incision as needed or as directed by your health care provider.  Remove the bandages over your chest tube site as directed by your health care provider.  Resume your normal diet as directed. It is important to have adequate protein, calories, vitamins, and minerals to promote healing.  Prevent constipation.  Eat  high-fiber foods such as whole grain cereals and breads, brown rice, beans, and fresh fruits and vegetables.  Drink enough water and fluids to keep your urine clear or pale yellow. Avoid drinking beverages containing caffeine. Beverages containing caffeine can cause dehydration and harden your stool.  Talk to your health care provider about taking a stool softener or laxative.  Avoid lifting until you are instructed otherwise.  Do not drive until directed by your health care provider.  Do not drive while taking pain medicines (narcotics).  Do not bathe, swim, or use a hot tub until directed by your health care provider. You may shower instead. Gently wash the area of your incision with water and soap as directed. Do not use anything else to clean your incision except as directed by your health care provider.  Do not use any tobacco products including cigarettes, chewing tobacco, or electronic cigarettes.  Avoid secondhand smoke.  Schedule an appointment for stitch (suture) or staple removal as directed.  Schedule and attend all follow-up visits as directed by your health care provider. It is important to keep all your appointments.  Participate in pulmonary rehabilitation as directed by your health care provider.  Do not travel by airplane for 2 weeks after your chest tube is removed. SEEK MEDICAL CARE IF:  You are bleeding from your wounds.  Your heartbeat seems irregular.  You have redness, swelling, or increasing pain in the wounds.  There is pus coming from your wounds.  There is a bad smell coming from the wound or dressing.  You have a fever or chills.  You have nausea or are vomiting.  You have muscle aches. SEEK  IMMEDIATE MEDICAL CARE IF:  You have a rash.  You have difficulty breathing.  You have a reaction or side effect to medicines given.  You have persistent nausea.  You have lightheadedness or feel faint.  You have shortness of breath or chest  pain.  You have persistent pain. Document Released: 12/05/2010 Document Revised: 06/27/2013 Document Reviewed: 02/08/2013 [facility] Patient Information 2015 [place], [place]. This information is not intended to replace advice given to you by your health care provider. Make sure you discuss any questions you have with your health care provider.

## 2014-02-16 NOTE — Discharge Summary (Signed)
Physician Discharge Summary  Patient ID: [name] MRN: [id number] DOB/AGE: 73-Aug-1942 73 y.o.  Admit date: 02/12/2014 Discharge date: 02/17/2014  Admission Diagnoses:  Patient Active Problem List   Diagnosis Date Noted  . Asthma 01/30/2014  .  Multiple pulmonary nodules, largest R mid lung 06/29/2013  . Cough 05/31/2013   Discharge Diagnoses:  Stage Ia adenocarcinoma of right lung  Patient Active Problem List   Diagnosis Date Noted  . S/P lobectomy of lung 02/12/2014  . Asthma 01/30/2014  .  Multiple pulmonary nodules, largest R mid lung 06/29/2013  . Cough 05/31/2013   Discharged Condition: good  History of Present Illness:   [name] is a 73 yo obese white female who presents with complaint of persistent cough.  She has been routinely followed by Dr. Melvyn Benson who has treated her medically.  CT scan was done and showed multiple nodules with scattered ground glass densities in both lungs.  She had repeat CT scan in January of this year which showed a dominant ground glass opacity in the right lower lobe.  Follow up scan in April showed a solid component of the right lower lobe nodule, however it had not changed much in size.  She again underwent CT scan in July which showed increase in size of the lung lesion in the central portion.  It was felt she should be referred for possible surgical resection.  She was evaluated by Dr. Roxan Benson on 01/30/2014 at which time he felt surgical resection would be indicated.  It was also felt she should undergo PET CT scan to ensure no other lesions were present.  This was performed and showed some hypermetabolic activity however it was not within the malignant range.  It was still felt she should proceed with surgical resection.  The risks and benefits of the procedure were explained to the patient and she was agreeable to proceed.  Hospital Course:   She presented to [facility] on [facility]12[facility].  She was taken to the operating room  and underwent Right Mini Thoracotomy with wedge resection of the right lower lobe, completion lobectomy of the right lower lobe, limited lymph node sampling and placement of ON-Q catheter.  The patient tolerated the procedure without difficulty, was extubated and taken to the PACU in stable condition.  The patient has done well post operatively.  Her chest tubes did not show evidence of an air leak and were therefore transitioned to water seal.  Follow up CXR did not show evidence of pneumothorax, however the chest tubes now exhibited and air leak with cough.  These remained on suction until the air leak resolved.  Once chest tube output had decreased her chest tubes were removed as tolerated.  The patient continues to progress.  She is ambulating with minimal difficulty.  She is tolerating a regular diet.  CXR will be obtained in the morning.  As long as it remains stable and no pneumothorax develops we will plan to discharge the patient on 02/17/2014.  Final pathology confirms the presence of Invasive Adenocarcinoma and report is pasted below.    Significant Diagnostic Studies: radiology:   CT scan:  1. Persistent sub solid nodule in the superior segment of the right  lower lobe. Adenocarcinoma is considered. Thoracic surgery  consultation is recommended. PET/CT should be considered for staging  purposes.These recommendations are taken from "Recommendations for  the Management of Subsolid Pulmonary Nodules Detected at CT: A  Statement from the [place]" Radiology 2013; 266:1,  [phone].  2. Additional smaller ground-glass densities and pulmonary nodules  bilaterally are unchanged, nonspecific.  3. No evidence of adenopathy or pleural effusion.  PET CT:  1. Sub solid nodule within the superior segment of right lower lobe  has an SUV max equal to 1.17. Although this is below the malignant  range pulmonary adenocarcinoma is may be false negative on PET-CT.  Biopsy or surgical resection  should be considered. This  recommendation follows the consensus statement: Recommendations for  the Management of Subsolid Pulmonary Nodules Detected at CT: A  Statement from the [place]. Radiology 8144;818:563-149.  2. Other small sub cm nodules  Treatments: surgery:   Right mini thoracotomy wedge resection, right lower lobe; right lower lobectomy; limited lymph node sampling; ON-Q local anesthetic catheter placement.  Pathology:  1. Lung, wedge biopsy/resection, right lower lobe - INVASIVE ADENOCARCINOMA, SEE COMMENT. - NEGATIVE FOR LYMPH/VASCULAR INVASION. - INVASIVE TUMOR PRESENT AT SURGICAL MARGIN (PART 4 FINAL MARGIN NEGATIVE). - SEE TUMOR SYNOPTIC TEMPLATE BELOW. 2. Lymph node, biopsy, 11 R - ONE LYMPH NODE, NEGATIVE FOR TUMOR (0/1). 3. Lymph node, biopsy, 11 R #2 - ONE LYMPH NODE, NEGATIVE FOR TUMOR (0/1). 4. Lung, resection (segmental or lobe), Right lower lobe - BENIGN LUNG, SEE COMMENT. - NEGATIVE FOR EPITHELIAL DYSPLASIA OR MALIGNANCY. - BRONCHIAL MARGIN, NEGATIVE FOR DYSPLASIA OR MALIGNANCY. 5. Lymph node, biopsy, 7 node - ONE LYMPH NODE, NEGATIVE FOR TUMOR (0/1). 6. Lymph node, biopsy, 10 R - ONE LYMPH NODE, NEGATIVE FOR TUMOR (0/1).  Disposition: 01-Home or Self Care  Discharge Medications:     Medication List         albuterol 108 (90 BASE) MCG/ACT inhaler  Commonly known as:  PROAIR HFA  Inhale 2 puffs into the lungs every 6 (six) hours as needed for wheezing or shortness of breath.     CALCIUM 1200+D3 PO  Take 1 tablet by mouth at bedtime.     CENTRUM SILVER PO  Take 1 tablet by mouth daily.     chlorpheniramine 4 MG tablet  Commonly known as:  CHLOR-TRIMETON  Take 4 mg by mouth at bedtime.     cycloSPORINE 0.05 % ophthalmic emulsion  Commonly known as:  RESTASIS  Place 1 drop into both eyes 2 (two) times daily.     famotidine 20 MG tablet  Commonly known as:  PEPCID  Take 20 mg by mouth at bedtime.     fenofibrate 160 MG  tablet  Take 160 mg by mouth daily.     FLUoxetine 20 MG capsule  Commonly known as:  PROZAC  Take 40 mg by mouth daily.     FLUTTER Devi  Use as directed     montelukast 10 MG tablet  Commonly known as:  SINGULAIR  Take 10 mg by mouth at bedtime.     omeprazole 20 MG capsule  Commonly known as:  PRILOSEC  Take 40 mg by mouth daily.     oxyCODONE 5 MG immediate release tablet  Commonly known as:  Oxy IR/ROXICODONE  Take 1-2 tablets (5-10 mg total) by mouth every 4 (four) hours as needed for severe pain.     solifenacin 10 MG tablet  Commonly known as:  VESICARE  Take 10 mg by mouth daily.     traZODone 50 MG tablet  Commonly known as:  DESYREL  Take 50 mg by mouth at bedtime.          Follow-up Information   Follow up with Melrose Nakayama, MD On 03/06/2014. (Appointment  is at 3:30)    Specialty:  Cardiothoracic Surgery   Contact information:   [address] [place] [address] [phone]       Follow up with [facility] On 03/06/2014. (Please get CXR at 2:30, office is on first floor of [facility])    Contact information:   [place]       Signed: BARRETT, ERIN 02/17/2014, 9:07 AM

## 2014-02-17 ENCOUNTER — Inpatient Hospital Stay (HOSPITAL_COMMUNITY): Payer: Medicare Other

## 2014-02-17 MED ORDER — OXYCODONE HCL 5 MG PO TABS: 5.0000 mg | ORAL_TABLET | ORAL | Status: DC | PRN

## 2014-02-17 NOTE — Progress Notes (Signed)
5 Days Post-Op Procedure(s) (LRB): RIGHT VIDEO ASSISTED THORACOSCOPY RIGHT LOWER LOBE LUNG /WEDGE RESECTION,RIGHT THORACOTOMY WITH RIGHT LOWER LOBE LOBECTOMY & NODE DISSECTION (Right) VIDEO BRONCHOSCOPY (N/A) Subjective: No complaints this AM  Objective: Vital signs in last 24 hours: Temp:  [97.7 F (36.5 C)-98.3 F (36.8 C)] 97.7 F (36.5 C) (08/15 0753) Pulse Rate:  [69-77] 69 (08/15 0753) Cardiac Rhythm:  [-] Normal sinus rhythm (08/15 0800) Resp:  [14-27] 24 (08/15 0800) BP: (117-148)/(48-65) 117/65 mmHg (08/15 0753) SpO2:  [93 %-99 %] 98 % (08/15 0800)  Hemodynamic parameters for last 24 hours:    Intake/Output from previous day: 08/14 0701 - 08/15 0700 In: 130 [I.V.:130] Out: 875 [Urine:825; Chest Tube:50] Intake/Output this shift: Total I/O In: 10 [I.V.:10] Out: -   General appearance: alert and no distress Neurologic: intact Heart: regular rate and rhythm Lungs: diminished breath sounds right base Wound: clean and dry  Lab Results: No results found for this basename: WBC, HGB, HCT, PLT,  in the last 72 hours BMET: No results found for this basename: NA, K, CL, CO2, GLUCOSE, BUN, CREATININE, CALCIUM,  in the last 72 hours  PT/INR: No results found for this basename: LABPROT, INR,  in the last 72 hours ABG    Component Value Date/Time   PHART 7.410 02/08/2014 1243   HCO3 23.7 02/08/2014 1243   TCO2 24.9 02/08/2014 1243   ACIDBASEDEF 0.3 02/08/2014 1243   O2SAT 95.6 02/08/2014 1243   CBG (last 3)   Recent Labs  02/14/14 1551  GLUCAP 114*    Assessment/Plan: S/P Procedure(s) (LRB): RIGHT VIDEO ASSISTED THORACOSCOPY RIGHT LOWER LOBE LUNG /WEDGE RESECTION,RIGHT THORACOTOMY WITH RIGHT LOWER LOBE LOBECTOMY & NODE DISSECTION (Right) VIDEO BRONCHOSCOPY (N/A) Plan for discharge: see discharge orders  Doing well Dc PCA Home today   LOS: 5 days    Halen Antenucci C 02/17/2014

## 2014-02-17 NOTE — Progress Notes (Addendum)
Discharge instructions given to patient and daughter with teach-back.  All questions answered, no complaints.  Central line dc'd intact using hospital protocol.  Tolerated without event. Fentanyl PCA wasted in sink 23 cc's.

## 2014-02-27 ENCOUNTER — Ambulatory Visit (INDEPENDENT_AMBULATORY_CARE_PROVIDER_SITE_OTHER): Payer: Self-pay

## 2014-02-27 DIAGNOSIS — C3430 Malignant neoplasm of lower lobe, unspecified bronchus or lung: Secondary | ICD-10-CM

## 2014-02-27 DIAGNOSIS — R918 Other nonspecific abnormal finding of lung field: Secondary | ICD-10-CM

## 2014-02-27 NOTE — Progress Notes (Signed)
Pt was in office for Suture Removal.Pt's site is healing well no Redness/No Soreness.Skin looks pink.No Draining. Pt tolerated removal well

## 2014-03-02 ENCOUNTER — Other Ambulatory Visit: Payer: Self-pay | Admitting: Thoracic Surgery (Cardiothoracic Vascular Surgery)

## 2014-03-02 DIAGNOSIS — R918 Other nonspecific abnormal finding of lung field: Secondary | ICD-10-CM

## 2014-03-06 ENCOUNTER — Ambulatory Visit (INDEPENDENT_AMBULATORY_CARE_PROVIDER_SITE_OTHER): Payer: Self-pay | Admitting: Thoracic Surgery (Cardiothoracic Vascular Surgery)

## 2014-03-06 ENCOUNTER — Encounter: Payer: Self-pay | Admitting: Thoracic Surgery (Cardiothoracic Vascular Surgery)

## 2014-03-06 ENCOUNTER — Ambulatory Visit
Admission: RE | Admit: 2014-03-06 | Discharge: 2014-03-06 | Disposition: A | Discharge status: Home / Self Care | Payer: Medicare Other | Source: Ambulatory Visit | Attending: Thoracic Surgery (Cardiothoracic Vascular Surgery) | Admitting: Thoracic Surgery (Cardiothoracic Vascular Surgery)

## 2014-03-06 VITALS — BP 146/84 | HR 84 | Resp 16 | Ht 64.0 in | Wt 194.0 lb

## 2014-03-06 DIAGNOSIS — Z902 Acquired absence of lung [part of]: Secondary | ICD-10-CM

## 2014-03-06 DIAGNOSIS — C3430 Malignant neoplasm of lower lobe, unspecified bronchus or lung: Secondary | ICD-10-CM

## 2014-03-06 DIAGNOSIS — Z9889 Other specified postprocedural states: Secondary | ICD-10-CM

## 2014-03-06 DIAGNOSIS — R918 Other nonspecific abnormal finding of lung field: Secondary | ICD-10-CM

## 2014-03-06 NOTE — Progress Notes (Signed)
HPI:  [name] returns today for a scheduled postoperative followup visit.  She is a 73 year old woman who was found to have a right lung nodule. We did a thoracoscopic right lower lobectomy on 02/12/2014. This was difficult because we were unable to place a double-lumen endotracheal tube and the procedure had be done with intermittent ventilation. She did well postoperatively and did not have any significant calcification.  She says she's feeling well. She does notice that she get short of breath with less exertion and she did prior surgery. She has not take any pain medication in about the last 10 days. She does have some discomfort but is not severe enough to warrant medication. She is anxious to resume normal activities.  Past Medical History  Diagnosis Date  . Osteopenia   . Hypertriglyceridemia   . Depression with anxiety   . GERD (gastroesophageal reflux disease)   . OA (osteoarthritis)   . Colon polyp   . Atrophic vaginitis   . Kidney stones   . Contact lens/glasses fitting     wears contacts or glasses  . Asthma   . Chronic iritis of left eye     Pupil stays dilated; nonreactive  . PONV (postoperative nausea and vomiting)   . Anxiety   . Depression   . Environmental allergies       Current Outpatient Prescriptions  Medication Sig Dispense Refill  . albuterol (PROAIR HFA) 108 (90 BASE) MCG/ACT inhaler Inhale 2 puffs into the lungs every 6 (six) hours as needed for wheezing or shortness of breath.  1 Inhaler  4  . Calcium-Magnesium-Vitamin D (CALCIUM 1200+D3 PO) Take 1 tablet by mouth at bedtime.      . chlorpheniramine (CHLOR-TRIMETON) 4 MG tablet Take 4 mg by mouth at bedtime.      . cycloSPORINE (RESTASIS) 0.05 % ophthalmic emulsion Place 1 drop into both eyes 2 (two) times daily.      . famotidine (PEPCID) 20 MG tablet Take 20 mg by mouth at bedtime.       . fenofibrate 160 MG tablet Take 160 mg by mouth daily.      Marland Kitchen FLUoxetine (PROZAC) 20 MG capsule Take 40  mg by mouth daily.       . montelukast (SINGULAIR) 10 MG tablet Take 10 mg by mouth at bedtime.      . Multiple Vitamins-Minerals (CENTRUM SILVER PO) Take 1 tablet by mouth daily.      Marland Kitchen omeprazole (PRILOSEC) 20 MG capsule Take 40 mg by mouth daily.       Marland Kitchen Respiratory Therapy Supplies (FLUTTER) DEVI Use as directed  1 each  0  . solifenacin (VESICARE) 10 MG tablet Take 10 mg by mouth daily.      . traZODone (DESYREL) 50 MG tablet Take 50 mg by mouth at bedtime.       No current facility-administered medications for this visit.    Physical Exam BP 146/84  Pulse 84  Resp 16  Ht 5\' 4"  (1.626 m)  Wt 194 lb (87.998 kg)  BMI 33.28 kg/m2  SpO54 51% 73 year old woman in no acute distress Alert and oriented x3 Cardiac regular rate and rhythm Lungs slightly diminished right base, otherwise clear Incisions healing well  Diagnostic Tests: CHEST 2 VIEW  COMPARISON: Chest radiograph February 17, 2014  FINDINGS:  No residual pneumothorax. Small to moderate right pleural effusion  with underlying airspace opacity. Left lung remains clear.  Cardiomediastinal silhouette is unremarkable. Soft tissue planes and  included osseous structures  are nonsuspicious, resolution of  subcutaneous gas in right chest.  IMPRESSION:  Small to moderate right pleural effusion and underlying airspace  opacity. Recommend followup chest radiograph after treatment to  verify improvement.  No residual pneumothorax or right chest wall subcutaneous gas.  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 Her case was reviewed at our multidisciplinary thoracic oncology conference, and the consensus was that no adjuvant therapy was indicated. She has an excellent prognosis.  She is recovering quite well from surgery. She's not had to take any pain medication for the last 10  days.  She may begin driving, appropriate precautions were discussed. Her activities are otherwise unrestricted, but she was cautioned to build into new activities gradually.   Plan: Return in 2 months with PA and lateral chest x-ray.

## 2014-03-28 ENCOUNTER — Other Ambulatory Visit: Payer: Self-pay | Admitting: Internal Medicine

## 2014-03-28 MED ORDER — MONTELUKAST SODIUM 10 MG PO TABS: 10.0000 mg | ORAL_TABLET | Freq: Every day | ORAL | Status: AC

## 2014-04-09 ENCOUNTER — Other Ambulatory Visit: Payer: Self-pay

## 2014-05-02 ENCOUNTER — Other Ambulatory Visit: Payer: Self-pay | Admitting: Thoracic Surgery (Cardiothoracic Vascular Surgery)

## 2014-05-02 DIAGNOSIS — R918 Other nonspecific abnormal finding of lung field: Secondary | ICD-10-CM

## 2014-05-08 ENCOUNTER — Encounter: Payer: Self-pay | Admitting: Thoracic Surgery (Cardiothoracic Vascular Surgery)

## 2014-05-08 ENCOUNTER — Ambulatory Visit (INDEPENDENT_AMBULATORY_CARE_PROVIDER_SITE_OTHER): Payer: Self-pay | Admitting: Thoracic Surgery (Cardiothoracic Vascular Surgery)

## 2014-05-08 ENCOUNTER — Ambulatory Visit
Admission: RE | Admit: 2014-05-08 | Discharge: 2014-05-08 | Disposition: A | Discharge status: Home / Self Care | Payer: Medicare Other | Source: Ambulatory Visit | Attending: Thoracic Surgery (Cardiothoracic Vascular Surgery) | Admitting: Thoracic Surgery (Cardiothoracic Vascular Surgery)

## 2014-05-08 VITALS — BP 160/88 | HR 90 | Resp 20 | Ht 64.0 in | Wt 194.0 lb

## 2014-05-08 DIAGNOSIS — R918 Other nonspecific abnormal finding of lung field: Secondary | ICD-10-CM

## 2014-05-08 DIAGNOSIS — Z902 Acquired absence of lung [part of]: Secondary | ICD-10-CM

## 2014-05-08 DIAGNOSIS — C3490 Malignant neoplasm of unspecified part of unspecified bronchus or lung: Secondary | ICD-10-CM | POA: Insufficient documentation

## 2014-05-08 DIAGNOSIS — C3491 Malignant neoplasm of unspecified part of right bronchus or lung: Secondary | ICD-10-CM

## 2014-05-08 DIAGNOSIS — Z9889 Other specified postprocedural states: Secondary | ICD-10-CM

## 2014-05-08 NOTE — Progress Notes (Signed)
HPI:  [name] returns today for a scheduled postoperative followup visit.  She is a 73 year old woman who had a right lower lobectomy on 02/12/2014. This was difficult because Anesthesia was unable to place a double-lumen endotracheal tube or a bronchial blocker, so the procedure had be done with intermittent ventilation. She did well postoperatively and did not have any significant complication.  I last saw her in the office in September at which time she was making good progress.  She says that she still has some incisional pain. She is not taking any pain medication for that. She still does have some shortness of breath with exertion. Her exercise tolerance is not back to her baseline. It has improved since her last visit. She recently had some difficulty with her husband. He has been in poor health and recently fell 3 times while at the beach. She had to pick him up each time and has been having more pain at her incision since that happened. Her chronic cough is unchanged.  Past Medical History  Diagnosis Date  . Osteopenia   . Hypertriglyceridemia   . Depression with anxiety   . GERD (gastroesophageal reflux disease)   . OA (osteoarthritis)   . Colon polyp   . Atrophic vaginitis   . Kidney stones   . Contact lens/glasses fitting     wears contacts or glasses  . Asthma   . Chronic iritis of left eye     Pupil stays dilated; nonreactive  . PONV (postoperative nausea and vomiting)   . Anxiety   . Depression   . Environmental allergies       Current Outpatient Prescriptions  Medication Sig Dispense Refill  . albuterol (PROAIR HFA) 108 (90 BASE) MCG/ACT inhaler Inhale 2 puffs into the lungs every 6 (six) hours as needed for wheezing or shortness of breath. 1 Inhaler 4  . Calcium-Magnesium-Vitamin D (CALCIUM 1200+D3 PO) Take 1 tablet by mouth at bedtime.    . chlorpheniramine (CHLOR-TRIMETON) 4 MG tablet Take 4 mg by mouth at bedtime.    . cycloSPORINE (RESTASIS) 0.05 %  ophthalmic emulsion Place 1 drop into both eyes 2 (two) times daily.    . famotidine (PEPCID) 20 MG tablet Take 20 mg by mouth at bedtime.     . fenofibrate 160 MG tablet Take 160 mg by mouth daily.    Marland Kitchen FLUoxetine (PROZAC) 20 MG capsule Take 40 mg by mouth daily.     . montelukast (SINGULAIR) 10 MG tablet Take 1 tablet (10 mg total) by mouth at bedtime. 90 tablet 0  . Multiple Vitamins-Minerals (CENTRUM SILVER PO) Take 1 tablet by mouth daily.    Marland Kitchen omeprazole (PRILOSEC) 20 MG capsule Take 40 mg by mouth daily.     Marland Kitchen Respiratory Therapy Supplies (FLUTTER) DEVI Use as directed 1 each 0  . solifenacin (VESICARE) 10 MG tablet Take 10 mg by mouth daily.    . traZODone (DESYREL) 50 MG tablet Take 50 mg by mouth at bedtime.     No current facility-administered medications for this visit.    Physical Exam BP 160/88 mmHg  Pulse 90  Resp 20  Ht 5\' 4"  (1.626 m)  Wt 194 lb (87.998 kg)  BMI 33.28 kg/m2  SpO66 65% 73 year old woman in no acute distress Alert and oriented 3 with no focal deficits Incisions well healed Lungs mildly diminished at right base otherwise clear Cardiac regular rate and rhythm normal S1 and S2  Diagnostic Tests: CHEST 2 VIEW  COMPARISON: 03/06/2014.  FINDINGS: Small chronic right pleural effusion is stable. There is mild associated parenchymal opacity most likely atelectasis, which appears mildly improved. No left pleural effusion.  No lung consolidation or edema. A pulmonary anastomosis staple line lies across the mid to lower right lung, unchanged. No lung mass or discrete nodule.  Cardiac silhouette is normal in size. No mediastinal or hilar masses or evidence of adenopathy.  Bony thorax is demineralized but intact.  IMPRESSION: No acute cardiopulmonary disease. Small chronic right pleural effusion with associated basilar atelectasis. Atelectasis has mildly improved.   Electronically Signed  By: Lajean Manes M.D.  On: 05/08/2014  13:12   Impression: 73 year old woman who is now 3 months out from a right lower lobectomy for stage IA adenocarcinoma. She has no evidence recurrent disease.  She is still having pain which is not surprising as we had to do this through a larger incision due to our inability to place a double-lumen endotracheal tube or establish a bronchial blocker.she is not taking any narcotics for the pain.  Her exercise tolerance is still diminished. Again this is not surprising this early after surgery. I did encourage her to get into a regular exercise program. [facility] I [facility] that is fine, but really any kind of regular exercise will be good for her.  Her blood pressure was elevated. She says that she's been quite stress today she had good of the dentist this morning before coming to her appointment here. She will monitor this over the next couple of weeks to make sure that it is not continually elevated.   Plan: Return in 3 months for her 6 month follow-up visit. We'll do a CT of the chest at that time

## 2014-07-10 ENCOUNTER — Other Ambulatory Visit: Payer: Self-pay | Admitting: *Deleted

## 2014-07-10 DIAGNOSIS — C3491 Malignant neoplasm of unspecified part of right bronchus or lung: Secondary | ICD-10-CM

## 2014-07-19 ENCOUNTER — Encounter (HOSPITAL_BASED_OUTPATIENT_CLINIC_OR_DEPARTMENT_OTHER): Payer: Self-pay | Admitting: Orthopedic Surgery

## 2014-08-14 ENCOUNTER — Ambulatory Visit
Admission: RE | Admit: 2014-08-14 | Discharge: 2014-08-14 | Disposition: A | Discharge status: Home / Self Care | Payer: Medicare Other | Source: Ambulatory Visit | Attending: Thoracic Surgery (Cardiothoracic Vascular Surgery) | Admitting: Thoracic Surgery (Cardiothoracic Vascular Surgery)

## 2014-08-14 ENCOUNTER — Encounter: Payer: Self-pay | Admitting: Thoracic Surgery (Cardiothoracic Vascular Surgery)

## 2014-08-14 ENCOUNTER — Ambulatory Visit (INDEPENDENT_AMBULATORY_CARE_PROVIDER_SITE_OTHER): Payer: Medicare Other | Admitting: Thoracic Surgery (Cardiothoracic Vascular Surgery)

## 2014-08-14 VITALS — BP 170/76 | HR 78 | Resp 20 | Ht 64.0 in | Wt 195.0 lb

## 2014-08-14 DIAGNOSIS — C3491 Malignant neoplasm of unspecified part of right bronchus or lung: Secondary | ICD-10-CM

## 2014-08-14 DIAGNOSIS — Z9889 Other specified postprocedural states: Secondary | ICD-10-CM

## 2014-08-14 DIAGNOSIS — Z902 Acquired absence of lung [part of]: Secondary | ICD-10-CM

## 2014-08-14 NOTE — Progress Notes (Signed)
HPI:  [name] returns today for a scheduled follow-up visit.  She is a 74 year old woman who had a right lower lobectomy for stage IA non-small cell carcinoma in August 2015. We had originally planned to do a thoracoscopic resection but anesthesiology was unable to get a double-lumen tube in. We had to do a small thoracotomy and do the lobectomy with intermittent ventilation. I saw in the office about 3 months ago. At that time she was recovering well but still having a significant amount of incisional pain.  She says that her pain has gotten better since then. She's not having to take anything for the pain but she does still feel occasionally, particularly when she coughs. She also has some numbness in the right upper quadrant. She does have an occasional cough but nothing out of the ordinary. She has not lost weight. She's not having any issues with her breathing.  She has been under a great deal of emotional stress in dealing with her husband. He has Alzheimer's. He recently had multiple falls requiring hospitalization. He then had a gastrointestinal bleed. She now is planning for him to go to assisted living soon.  Past Medical History  Diagnosis Date  . Osteopenia   . Hypertriglyceridemia   . Depression with anxiety   . GERD (gastroesophageal reflux disease)   . OA (osteoarthritis)   . Colon polyp   . Atrophic vaginitis   . Kidney stones   . Contact lens/glasses fitting     wears contacts or glasses  . Asthma   . Chronic iritis of left eye     Pupil stays dilated; nonreactive  . PONV (postoperative nausea and vomiting)   . Anxiety   . Depression   . Environmental allergies   . Adenocarcinoma of lung, stage 1     Status post right lower lobectomy August 2015    Past Surgical History  Procedure Laterality Date  . Partial hysterectomy    . Appendectomy    . Colonoscopy    . Orif wrist fracture Left 08/04/2013    Procedure: OPEN REDUCTION INTERNAL FIXATION (ORIF) LEFT  DISTAL RADIUS WRIST FRACTURE;  Surgeon: Wynonia Sours, MD;  Location: [facility];  Service: Orthopedics;  Laterality: Left;  Marland Kitchen Eye surgery Left     cataract removal  . Video assisted thoracoscopy (vats)/wedge resection Right 02/12/2014    Procedure: RIGHT VIDEO ASSISTED THORACOSCOPY RIGHT LOWER LOBE LUNG /WEDGE RESECTION,RIGHT THORACOTOMY WITH RIGHT LOWER LOBE LOBECTOMY & NODE DISSECTION;  Surgeon: Melrose Nakayama, MD;  Location: [facility];  Service: Thoracic;  Laterality: Right;  . Video bronchoscopy N/A 02/12/2014    Procedure: VIDEO BRONCHOSCOPY;  Surgeon: Melrose Nakayama, MD;  Location: [facility];  Service: Thoracic;  Laterality: N/A;     Current Outpatient Prescriptions  Medication Sig Dispense Refill  . albuterol (PROAIR HFA) 108 (90 BASE) MCG/ACT inhaler Inhale 2 puffs into the lungs every 6 (six) hours as needed for wheezing or shortness of breath. 1 Inhaler 4  . Calcium-Magnesium-Vitamin D (CALCIUM 1200+D3 PO) Take 1 tablet by mouth at bedtime.    . chlorpheniramine (CHLOR-TRIMETON) 4 MG tablet Take 4 mg by mouth at bedtime.    . cyclobenzaprine (FLEXERIL) 10 MG tablet Take 10 mg by mouth 3 (three) times daily as needed.   0  . cycloSPORINE (RESTASIS) 0.05 % ophthalmic emulsion Place 1 drop into both eyes 2 (two) times daily.    . famotidine (PEPCID) 20 MG tablet Take 20 mg by mouth at  bedtime.     . fenofibrate 160 MG tablet Take 160 mg by mouth daily.    Marland Kitchen FLUoxetine (PROZAC) 20 MG capsule Take 40 mg by mouth daily.     . montelukast (SINGULAIR) 10 MG tablet Take 1 tablet (10 mg total) by mouth at bedtime. 90 tablet 0  . Multiple Vitamins-Minerals (CENTRUM SILVER PO) Take 1 tablet by mouth daily.    Marland Kitchen omeprazole (PRILOSEC) 40 MG capsule Take 40 mg by mouth daily.    Marland Kitchen Respiratory Therapy Supplies (FLUTTER) DEVI Use as directed 1 each 0  . solifenacin (VESICARE) 10 MG tablet Take 10 mg by mouth daily.    . traZODone (DESYREL) 50 MG tablet Take 50 mg by mouth at  bedtime.     No current facility-administered medications for this visit.    Physical Exam BP 170/76 mmHg  Pulse 78  Resp 20  Ht 5\' 4"  (1.626 m)  Wt 195 lb (88.451 kg)  BMI 33.46 kg/m2  SpO2 52% Obese 74 year old woman in no acute distress Alert and oriented 3, no focal neurologic deficits No cervical or subclavicular adenopathy Cardiac regular rate and rhythm normal S1 and S2 Lungs clear with equal breath sounds bilaterally Incisions well healed  Diagnostic Tests: CT CHEST WITHOUT CONTRAST  TECHNIQUE: Multidetector CT imaging of the chest was performed following the standard protocol without IV contrast.  COMPARISON: Multiple exams, including 01/09/2014 and 02/03/2014  FINDINGS: Mediastinum/Nodes: No pathologic thoracic adenopathy. Mild atherosclerotic calcification of the aortic arch. Calcifications along the mitral valve. Borderline cardiomegaly. Stable nodularity in the right upper breast, 1.4 cm on image 23 series 3, was not hypermetabolic on PET-CT accordingly likely benign.  Lungs/Pleura: Interval pleural thickening at the right lung base. Right lower lobectomy. Stable scattered Mild nodularity in both lungs with scattered nodules in the 2-4 mm range bilaterally, along with some sub solid and faint ground-glass opacities better appreciated in the upper lobes. On the right side these have a different orientation due to the interval lower lobectomy but appear to correspond to the ground-glass densities shown previously. None demonstrate obvious progression from are oldest chest CT, from 07/14/2013.  Upper abdomen: Chololithiasis.  Musculoskeletal: Thoracic spondylosis and degenerative disc disease.  IMPRESSION: 1. Scattered tiny nodules and some scattered ground-glass opacities in the lungs, not changed from 07/14/2013 aside from the interval right lower lobectomy. While potentially benign, the possibility of lepidic predominant low grade  adenocarcinoma in the ground-glass opacities is not readily excluded, and surveillance monitoring by chest CT at least annually is recommended. 2. Cholelithiasis. 3. Mild atherosclerosis. 4. Borderline cardiomegaly.   Electronically Signed  By: Van Clines M.D.  On: 08/14/2014 11:29  I reviewed her CT of the chest she does have multiple groundglass opacities that are unchanged. There is no evidence recurrent disease.  Impression: [name] is a 74 year old woman who is now 6 months out from a right lower lobectomy for a stage IA non-small cell carcinoma. She has no evidence recurrent disease.  She does have multiple other groundglass opacities bilaterally. These are unchanged. They do need to continue to be followed. They could possibly represent adenocarcinoma in situ.  Her blood pressure was elevated today. Is not surprising given the degree of stress that she is under in trying to take care of her husband has Alzheimer's.   Plan:  Return in 6 months with CT of chest for one year follow-up  I spent 15 minutes with [name].

## 2014-09-03 ENCOUNTER — Telehealth: Payer: Self-pay | Admitting: *Deleted

## 2014-09-03 NOTE — Telephone Encounter (Signed)
Error

## 2014-09-03 NOTE — Telephone Encounter (Signed)
-----   Message from Tanda Rockers, MD sent at 06/27/2013  3:29 PM EST ----- Needs f/u cxr by now

## 2014-09-18 ENCOUNTER — Encounter (INDEPENDENT_AMBULATORY_CARE_PROVIDER_SITE_OTHER): Payer: Medicare Other | Admitting: Ophthalmology

## 2014-09-18 DIAGNOSIS — H34831 Tributary (branch) retinal vein occlusion, right eye: Secondary | ICD-10-CM

## 2014-09-18 DIAGNOSIS — H3531 Nonexudative age-related macular degeneration: Secondary | ICD-10-CM | POA: Diagnosis not present

## 2014-09-18 DIAGNOSIS — H43813 Vitreous degeneration, bilateral: Secondary | ICD-10-CM

## 2014-10-08 ENCOUNTER — Encounter: Payer: Self-pay | Admitting: Internal Medicine

## 2014-10-16 ENCOUNTER — Encounter (INDEPENDENT_AMBULATORY_CARE_PROVIDER_SITE_OTHER): Payer: Medicare Other | Admitting: Ophthalmology

## 2014-10-17 ENCOUNTER — Encounter (INDEPENDENT_AMBULATORY_CARE_PROVIDER_SITE_OTHER): Payer: Medicare Other | Admitting: Ophthalmology

## 2014-10-18 ENCOUNTER — Encounter (INDEPENDENT_AMBULATORY_CARE_PROVIDER_SITE_OTHER): Payer: Medicare Other | Admitting: Ophthalmology

## 2014-10-18 DIAGNOSIS — H3531 Nonexudative age-related macular degeneration: Secondary | ICD-10-CM

## 2014-10-18 DIAGNOSIS — H34831 Tributary (branch) retinal vein occlusion, right eye: Secondary | ICD-10-CM

## 2014-10-18 DIAGNOSIS — H43813 Vitreous degeneration, bilateral: Secondary | ICD-10-CM

## 2014-11-29 ENCOUNTER — Encounter (INDEPENDENT_AMBULATORY_CARE_PROVIDER_SITE_OTHER): Payer: Medicare Other | Admitting: Ophthalmology

## 2014-11-29 DIAGNOSIS — H43813 Vitreous degeneration, bilateral: Secondary | ICD-10-CM

## 2014-11-29 DIAGNOSIS — H3531 Nonexudative age-related macular degeneration: Secondary | ICD-10-CM | POA: Diagnosis not present

## 2014-11-29 DIAGNOSIS — H34831 Tributary (branch) retinal vein occlusion, right eye: Secondary | ICD-10-CM | POA: Diagnosis not present

## 2015-01-17 ENCOUNTER — Other Ambulatory Visit: Payer: Self-pay | Admitting: *Deleted

## 2015-01-17 ENCOUNTER — Encounter (INDEPENDENT_AMBULATORY_CARE_PROVIDER_SITE_OTHER): Payer: Medicare Other | Admitting: Ophthalmology

## 2015-01-17 DIAGNOSIS — H43813 Vitreous degeneration, bilateral: Secondary | ICD-10-CM

## 2015-01-17 DIAGNOSIS — H34831 Tributary (branch) retinal vein occlusion, right eye: Secondary | ICD-10-CM | POA: Diagnosis not present

## 2015-01-17 DIAGNOSIS — H3531 Nonexudative age-related macular degeneration: Secondary | ICD-10-CM

## 2015-01-17 DIAGNOSIS — C3490 Malignant neoplasm of unspecified part of unspecified bronchus or lung: Secondary | ICD-10-CM

## 2015-02-19 ENCOUNTER — Ambulatory Visit (INDEPENDENT_AMBULATORY_CARE_PROVIDER_SITE_OTHER): Payer: Medicare Other | Admitting: Thoracic Surgery (Cardiothoracic Vascular Surgery)

## 2015-02-19 ENCOUNTER — Encounter: Payer: Self-pay | Admitting: Thoracic Surgery (Cardiothoracic Vascular Surgery)

## 2015-02-19 ENCOUNTER — Ambulatory Visit
Admission: RE | Admit: 2015-02-19 | Discharge: 2015-02-19 | Disposition: A | Discharge status: Home / Self Care | Payer: Medicare Other | Source: Ambulatory Visit | Attending: Thoracic Surgery (Cardiothoracic Vascular Surgery) | Admitting: Thoracic Surgery (Cardiothoracic Vascular Surgery)

## 2015-02-19 VITALS — BP 164/87 | HR 76 | Resp 16 | Ht 64.0 in | Wt 195.0 lb

## 2015-02-19 DIAGNOSIS — C3490 Malignant neoplasm of unspecified part of unspecified bronchus or lung: Secondary | ICD-10-CM

## 2015-02-19 DIAGNOSIS — Z9889 Other specified postprocedural states: Secondary | ICD-10-CM | POA: Diagnosis not present

## 2015-02-19 DIAGNOSIS — C3491 Malignant neoplasm of unspecified part of right bronchus or lung: Secondary | ICD-10-CM | POA: Diagnosis not present

## 2015-02-19 DIAGNOSIS — Z902 Acquired absence of lung [part of]: Secondary | ICD-10-CM

## 2015-02-19 NOTE — Progress Notes (Signed)
[address]       [place] [address]             [phone]       HPI: [name] returns today for a scheduled 1 year follow-up visit  She is a 74 year old woman who had a right lower lobectomy for stage IA non-small cell carcinoma in August 2015.. We had to do a small thoracotomy and do the lobectomy with intermittent ventilation, because anesthesia was unable to place a double-lumen tube. I last saw her in the office in February. At that time she was doing well. She was having a lot of anxiety and stress dealing with her husband and his Alzheimer's disease.  She says she has been feeling well. Her husband is now in assisted living and she is having a lot less stress related to that. She has not had any chest pain or shortness of breath. She has an occasional cough. Denies hemoptysis. She has not had any weight loss. Her appetite is good.  Past Medical History  Diagnosis Date  . Osteopenia   . Hypertriglyceridemia   . Depression with anxiety   . GERD (gastroesophageal reflux disease)   . OA (osteoarthritis)   . Colon polyp   . Atrophic vaginitis   . Kidney stones   . Contact lens/glasses fitting     wears contacts or glasses  . Asthma   . Chronic iritis of left eye     Pupil stays dilated; nonreactive  . PONV (postoperative nausea and vomiting)   . Anxiety   . Depression   . Environmental allergies   . Adenocarcinoma of lung, stage 1     Status post right lower lobectomy August 2015      Current Outpatient Prescriptions  Medication Sig Dispense Refill  . albuterol (PROAIR HFA) 108 (90 BASE) MCG/ACT inhaler Inhale 2 puffs into the lungs every 6 (six) hours as needed for wheezing or shortness of breath. 1 Inhaler 4  . Calcium-Magnesium-Vitamin D (CALCIUM 1200+D3 PO) Take 1 tablet by mouth at bedtime.    . chlorpheniramine (CHLOR-TRIMETON) 4 MG tablet Take 4 mg by mouth at bedtime.    . cyclobenzaprine (FLEXERIL) 10 MG tablet Take 10 mg by  mouth 3 (three) times daily as needed.   0  . cycloSPORINE (RESTASIS) 0.05 % ophthalmic emulsion Place 1 drop into both eyes 2 (two) times daily.    . famotidine (PEPCID) 20 MG tablet Take 20 mg by mouth at bedtime.     . fenofibrate 160 MG tablet Take 160 mg by mouth daily.    Marland Kitchen FLUoxetine (PROZAC) 20 MG capsule Take 40 mg by mouth daily.     . montelukast (SINGULAIR) 10 MG tablet Take 1 tablet (10 mg total) by mouth at bedtime. 90 tablet 0  . Multiple Vitamins-Minerals (CENTRUM SILVER PO) Take 1 tablet by mouth daily.    Marland Kitchen omeprazole (PRILOSEC) 40 MG capsule Take 40 mg by mouth daily.    Marland Kitchen Respiratory Therapy Supplies (FLUTTER) DEVI Use as directed 1 each 0  . solifenacin (VESICARE) 10 MG tablet Take 10 mg by mouth daily.    . traZODone (DESYREL) 50 MG tablet Take 50 mg by mouth at bedtime.     No current facility-administered medications for this visit.    Physical Exam BP 164/87 mmHg  Pulse 76  Resp 16  Ht '5\' 4"'$  (1.626 m)  Wt 195 lb (88.451 kg)  BMI 33.46 kg/m2  SpO2 97%  Obese 74 year old woman in no acute distress Alert and oriented 3 with no focal deficits No cervical or subclavicular adenopathy Cardiac regular rate and rhythm normal S1 and S2 Incisions well healed Diminished breath sounds at bases, otherwise clear, no wheezing  Diagnostic Tests: CT CHEST WITHOUT CONTRAST  TECHNIQUE: Multidetector CT imaging of the chest was performed following the standard protocol without IV contrast.  COMPARISON: 08/14/2014  FINDINGS: Mediastinum/Nodes: The heart is normal in size. No pericardial effusion.  Mild coronary atherosclerosis in the LAD.  Mild atherosclerotic calcifications of the aortic arch.  No suspicious mediastinal or axillary lymphadenopathy.  Visualized thyroid is unremarkable.  Lungs/Pleura: Status post right lower lobectomy.  Stable 1.8 x 2.8 cm ground-glass nodule in the lateral right lung apex (series 4/ image 13).  Additional  scattered ground-glass nodules including in the left upper lobe (series 4/ image 26 and in the posterior right upper lobe (series 4/image 18).  Scattered bilateral pulmonary nodules measuring up to 5 mm in the lungs bilaterally, unchanged. No dominant nodule or clear progression from prior studies.  No pleural effusion or pneumothorax.  Upper abdomen: Visualized upper abdomen is notable for a dependent 1.7 cm gallstone, mild hepatic steatosis, and vascular calcifications.  Musculoskeletal: Degenerative changes of the visualized thoracolumbar spine.  IMPRESSION: Status post right lower lobectomy.  Stable dominant ground-glass opacity in the lateral right lung apex.  Scattered ground-glass nodules/opacities and scattered sub-5 mm pulmonary nodules bilaterally.   Electronically Signed  By: Julian Hy M.D.  On: 02/19/2015 12:04  I personally reviewed her chest CT and concur with the findings as noted above. There are multiple very faint groundglass opacities bilaterally. None of these have changed in the interim since her last scan.  Impression: 74 year old woman who is now one year out from a right lower lobectomy for a stage IA non-small cell carcinoma. She has no evidence recurrent disease. She does have multiple faint groundglass opacities in the lungs bilaterally that need continued follow-up. These have been stable over time up until now. I will plan to rescan her in 6 months.  She was hypertensive today with a blood pressure 164/87. She was also hypertensive at her last visit. She followed up with Dr. Rex Kras regarding that and her blood pressure was normal when she checked it at home. I recommended that she once again follow-up with Dr. Rex Kras regarding that issue.  Plan: Return in 6 months with CT of chest  I spent 10 minutes with [name] during this visit, greater than 50% was in counseling  Melrose Nakayama, MD [facility] [phone]

## 2015-03-12 ENCOUNTER — Other Ambulatory Visit: Payer: Self-pay

## 2015-03-12 DIAGNOSIS — Z1231 Encounter for screening mammogram for malignant neoplasm of breast: Secondary | ICD-10-CM

## 2015-03-28 ENCOUNTER — Encounter (INDEPENDENT_AMBULATORY_CARE_PROVIDER_SITE_OTHER): Payer: Medicare Other | Admitting: Ophthalmology

## 2015-04-16 ENCOUNTER — Ambulatory Visit: Payer: Medicare Other

## 2015-04-17 ENCOUNTER — Encounter (HOSPITAL_COMMUNITY): Payer: Self-pay | Admitting: Emergency Medicine

## 2015-04-17 ENCOUNTER — Emergency Department (HOSPITAL_COMMUNITY): Payer: Medicare Other

## 2015-04-17 ENCOUNTER — Emergency Department (HOSPITAL_COMMUNITY)
Admission: EM | Admit: 2015-04-17 | Discharge: 2015-04-18 | Disposition: A | Discharge status: Home / Self Care | Payer: Medicare Other | Attending: Emergency Medicine | Admitting: Emergency Medicine

## 2015-04-17 DIAGNOSIS — F329 Major depressive disorder, single episode, unspecified: Secondary | ICD-10-CM | POA: Diagnosis not present

## 2015-04-17 DIAGNOSIS — S0081XA Abrasion of other part of head, initial encounter: Secondary | ICD-10-CM | POA: Diagnosis not present

## 2015-04-17 DIAGNOSIS — E781 Pure hyperglyceridemia: Secondary | ICD-10-CM | POA: Diagnosis not present

## 2015-04-17 DIAGNOSIS — Z79899 Other long term (current) drug therapy: Secondary | ICD-10-CM | POA: Insufficient documentation

## 2015-04-17 DIAGNOSIS — Z87891 Personal history of nicotine dependence: Secondary | ICD-10-CM | POA: Diagnosis not present

## 2015-04-17 DIAGNOSIS — J45909 Unspecified asthma, uncomplicated: Secondary | ICD-10-CM | POA: Diagnosis not present

## 2015-04-17 DIAGNOSIS — Z8742 Personal history of other diseases of the female genital tract: Secondary | ICD-10-CM | POA: Insufficient documentation

## 2015-04-17 DIAGNOSIS — S22080A Wedge compression fracture of T11-T12 vertebra, initial encounter for closed fracture: Secondary | ICD-10-CM | POA: Diagnosis not present

## 2015-04-17 DIAGNOSIS — Y9289 Other specified places as the place of occurrence of the external cause: Secondary | ICD-10-CM | POA: Diagnosis not present

## 2015-04-17 DIAGNOSIS — Z85118 Personal history of other malignant neoplasm of bronchus and lung: Secondary | ICD-10-CM | POA: Insufficient documentation

## 2015-04-17 DIAGNOSIS — W01198A Fall on same level from slipping, tripping and stumbling with subsequent striking against other object, initial encounter: Secondary | ICD-10-CM | POA: Diagnosis not present

## 2015-04-17 DIAGNOSIS — Z8669 Personal history of other diseases of the nervous system and sense organs: Secondary | ICD-10-CM | POA: Diagnosis not present

## 2015-04-17 DIAGNOSIS — Z86010 Personal history of colonic polyps: Secondary | ICD-10-CM | POA: Diagnosis not present

## 2015-04-17 DIAGNOSIS — K219 Gastro-esophageal reflux disease without esophagitis: Secondary | ICD-10-CM | POA: Insufficient documentation

## 2015-04-17 DIAGNOSIS — S3992XA Unspecified injury of lower back, initial encounter: Secondary | ICD-10-CM | POA: Diagnosis present

## 2015-04-17 DIAGNOSIS — F419 Anxiety disorder, unspecified: Secondary | ICD-10-CM | POA: Diagnosis not present

## 2015-04-17 DIAGNOSIS — Z87442 Personal history of urinary calculi: Secondary | ICD-10-CM | POA: Diagnosis not present

## 2015-04-17 DIAGNOSIS — W19XXXA Unspecified fall, initial encounter: Secondary | ICD-10-CM

## 2015-04-17 DIAGNOSIS — Y998 Other external cause status: Secondary | ICD-10-CM | POA: Diagnosis not present

## 2015-04-17 DIAGNOSIS — S0091XA Abrasion of unspecified part of head, initial encounter: Secondary | ICD-10-CM

## 2015-04-17 DIAGNOSIS — Y9389 Activity, other specified: Secondary | ICD-10-CM | POA: Diagnosis not present

## 2015-04-17 MED ORDER — ACETAMINOPHEN 325 MG PO TABS
650.0000 mg | ORAL_TABLET | Freq: Once | ORAL | Status: AC
  Administered 2015-04-17: 650 mg via ORAL
  Filled 2015-04-17: qty 2

## 2015-04-17 MED ORDER — HYDROCODONE-ACETAMINOPHEN 5-325 MG PO TABS
1.0000 | ORAL_TABLET | Freq: Once | ORAL | Status: AC
  Administered 2015-04-17: 1 via ORAL
  Filled 2015-04-17: qty 1

## 2015-04-17 NOTE — Progress Notes (Signed)
Patient presents to Ed with fall from home.  EDCM spoke to patient at bedside.  Patient confirms she lives at home alone.  Patient has her neighbors at bedside.  Patient confirms her pcp is Dr. Hulan Fess.  Patient reports she has her husbands walker and cane at home which are fine for patient to use per patient.  Patient reports she does not use any dme at home.  Patient reports she is able to complete her own ADL's on her own.  EDCM explained home health services, patient politely refused and also refused list of home health agencies in [facility].  Patient reports she has a list of home health agencies from when her husband had home health.  Patient thankful for assistance.  No further EDCM needs at this time.

## 2015-04-17 NOTE — ED Notes (Signed)
Pt is from home and states that she lost her balance and fell while going to open the door today. Hit head on the corner of the wall. No blood thinners. Hematoma on back of head-bleeding controlled. Alert and oriented.

## 2015-04-17 NOTE — ED Provider Notes (Signed)
CSN: [id number]     Arrival date & time 04/17/15  1819 History   First MD Initiated Contact with Patient 04/17/15 1828     Chief Complaint  Patient presents with  . Fall   HPI  [name] is a 74 year old presenting after a fall. Pt states that she was leaning over to pick her dog up when she lost her balance and fell backwards. She landed on her buttocks and hit her head on the corner of the wall. She denies LOC. She denies chest pain, SOB, dizziness or lightheadedness immediately before or after the fall. She was able to ambulate at the scene but felt that she needed help from her friends because she felt unsteady and was having lower back pain. She states the pain is located over her entire lumbar region and is exacerbated by movement. The pain does not radiate into her legs. She denies weakness or numbness in the lower extremities. She reports a laceration to her posterior occiput which is "throbbing". She is not on anticoagulants. Denies dizziness, blurred vision, confusion, neck pain, abdominal pain, nausea, vomiting or other injuries sustained in the fall.   Past Medical History  Diagnosis Date  . Osteopenia   . Hypertriglyceridemia   . Depression with anxiety   . GERD (gastroesophageal reflux disease)   . OA (osteoarthritis)   . Colon polyp   . Atrophic vaginitis   . Kidney stones   . Contact lens/glasses fitting     wears contacts or glasses  . Asthma   . Chronic iritis of left eye     Pupil stays dilated; nonreactive  . PONV (postoperative nausea and vomiting)   . Anxiety   . Depression   . Environmental allergies   . Adenocarcinoma of lung, stage 1 ([place])     Status post right lower lobectomy August 2015   Past Surgical History  Procedure Laterality Date  . Partial hysterectomy    . Appendectomy    . Colonoscopy    . Orif wrist fracture Left 08/04/2013    Procedure: OPEN REDUCTION INTERNAL FIXATION (ORIF) LEFT DISTAL RADIUS WRIST FRACTURE;  Surgeon: Wynonia Sours, MD;   Location: [facility];  Service: Orthopedics;  Laterality: Left;  Marland Kitchen Eye surgery Left     cataract removal  . Video assisted thoracoscopy (vats)/wedge resection Right 02/12/2014    Procedure: RIGHT VIDEO ASSISTED THORACOSCOPY RIGHT LOWER LOBE LUNG /WEDGE RESECTION,RIGHT THORACOTOMY WITH RIGHT LOWER LOBE LOBECTOMY & NODE DISSECTION;  Surgeon: Melrose Nakayama, MD;  Location: [facility];  Service: Thoracic;  Laterality: Right;  . Video bronchoscopy N/A 02/12/2014    Procedure: VIDEO BRONCHOSCOPY;  Surgeon: Melrose Nakayama, MD;  Location: [facility];  Service: Thoracic;  Laterality: N/A;   Family History  Problem Relation Age of Onset  . Stroke Father   . Lung cancer Sister     smoked  . Breast cancer Daughter 61  . Asthma Father   . Heart disease Father     CABG x 2   Social History  Substance Use Topics  . Smoking status: Former Smoker -- 0.75 packs/day for 20 years    Types: Cigarettes    Quit date: 07/06/1978  . Smokeless tobacco: Never Used  . Alcohol Use: 0.6 oz/week    1 Glasses of wine per week     Comment: occasional   OB History    No data available     Review of Systems  Eyes: Negative for visual disturbance.  Respiratory: Negative for shortness of breath.   Cardiovascular: Negative for chest pain.  Gastrointestinal: Negative for nausea, vomiting and abdominal pain.  Musculoskeletal: Positive for back pain. Negative for myalgias, arthralgias and neck pain.  Skin: Positive for wound.  Neurological: Positive for headaches. Negative for dizziness, syncope, weakness, light-headedness and numbness.  Hematological: Does not bruise/bleed easily.      Allergies  Nsaids and Prednisone  Home Medications   Prior to Admission medications   Medication Sig Start Date End Date Taking? Authorizing Provider  albuterol (PROAIR HFA) 108 (90 BASE) MCG/ACT inhaler Inhale 2 puffs into the lungs every 6 (six) hours as needed for wheezing or shortness of breath. 01/03/14   Yes Tanda Rockers, MD  Calcium-Magnesium-Vitamin D (CALCIUM 1200+D3 PO) Take 1 tablet by mouth at bedtime.   Yes Historical Provider, MD  chlorpheniramine (CHLOR-TRIMETON) 4 MG tablet Take 4 mg by mouth at bedtime.   Yes Historical Provider, MD  cyclobenzaprine (FLEXERIL) 10 MG tablet Take 10 mg by mouth 3 (three) times daily as needed.  07/11/14  Yes Historical Provider, MD  dextromethorphan (DELSYM) 30 MG/5ML liquid Take 60 mg by mouth 2 (two) times daily as needed for cough.   Yes Historical Provider, MD  famotidine (PEPCID) 20 MG tablet Take 20 mg by mouth at bedtime.  05/30/13  Yes Tanda Rockers, MD  fenofibrate 160 MG tablet Take 160 mg by mouth daily.   Yes Historical Provider, MD  FLUoxetine (PROZAC) 20 MG capsule Take 40 mg by mouth daily.    Yes Historical Provider, MD  losartan (COZAAR) 50 MG tablet Take 50 mg by mouth every morning. 03/07/15  Yes Historical Provider, MD  montelukast (SINGULAIR) 10 MG tablet Take 1 tablet (10 mg total) by mouth at bedtime. 03/28/14  Yes Tanda Rockers, MD  Multiple Vitamins-Minerals (CENTRUM SILVER PO) Take 1 tablet by mouth daily.   Yes Historical Provider, MD  omeprazole (PRILOSEC) 40 MG capsule Take 40 mg by mouth daily.   Yes Historical Provider, MD  Respiratory Therapy Supplies (FLUTTER) DEVI Use as directed 06/27/13  Yes Tanda Rockers, MD  solifenacin (VESICARE) 10 MG tablet Take 10 mg by mouth daily.   Yes Historical Provider, MD  traZODone (DESYREL) 50 MG tablet Take 50 mg by mouth at bedtime.   Yes Historical Provider, MD  HYDROcodone-acetaminophen (NORCO/VICODIN) 5-325 MG tablet Take 1-2 tablets by mouth every 6 (six) hours as needed. 04/18/15   Masen Luallen, PA-C   BP 166/90 mmHg  Pulse 60  Temp(Src) 97.6 F (36.4 C) (Oral)  Resp 15  SpO2 100% Physical Exam  Constitutional: She is oriented to person, place, and time. She appears well-developed and well-nourished. No distress.  HENT:  Head: Normocephalic and atraumatic.  Mouth/Throat:  Oropharynx is clear and moist. No oropharyngeal exudate.  Small abrasion to left posterior occiput with bleeding controlled  Eyes: Conjunctivae and EOM are normal. Right eye exhibits no discharge. Left eye exhibits no discharge. No scleral icterus.  Left pupil is dilated and mildly reactive to light. This is chronic condition per patient. Right pupil is small, round and reactive to light.   Neck: Normal range of motion.  No midline tenderness  Cardiovascular: Normal rate, regular rhythm and normal heart sounds.   Pedal pulses palpable  Pulmonary/Chest: Effort normal. No respiratory distress. She has no wheezes. She has no rales.  Abdominal: Soft. There is no tenderness.  Musculoskeletal: Normal range of motion.  TTP over lumbar region without point tenderness. No bony deformities  of cervical, thoracic or lumbar spine. Pt moves all extremities spontaneously and without obvious deformities. FROM of lumbar back though with moderate pain. Pt able to straight leg raise.   Neurological: She is alert and oriented to person, place, and time. No cranial nerve deficit. Coordination normal.  Cranial nerves 3-12 intact. 5/5 BLE strength. Sensation to light touch intact.   Skin: Skin is warm and dry.  Psychiatric: She has a normal mood and affect. Her behavior is normal.  Nursing note and vitals reviewed.   ED Course  Procedures (including critical care time) Labs Review Labs Reviewed - No data to display  Imaging Review Dg Lumbar Spine Complete  04/17/2015  CLINICAL DATA:  74 year old female who fell today. Pain across the low back. Initial encounter. EXAM: LUMBAR SPINE - COMPLETE 4+ VIEW COMPARISON:  PET-CT 02/03/2014.  CT Abdomen and Pelvis 03/21/2008. FINDINGS: Osteopenia. Normal lumbar segmentation. Mild T12 superior endplate deformity, new since 2009. Lumbar vertebral height and alignment is preserved. Intermittent lumbar disc space loss. Moderate to severe lower lumbar facet hypertrophy. No pars  fracture. Sacral ala and SI joints appear within normal limits. Aortoiliac calcified atherosclerosis noted. IMPRESSION: 1. Mild T12 superior endplate compression fracture is new since 2009 but age indeterminate. Noncontrast lumbar MRI or whole-body nuclear medicine bone scan would best evaluate further. 2.  No acute fracture or listhesis identified in the lumbar spine. Electronically Signed   By: Genevie Ann M.D.   On: 04/17/2015 20:48   Ct Head Wo Contrast  04/17/2015  CLINICAL DATA:  74 year old female with acute headache and cervical spine pain following fall today. EXAM: CT HEAD WITHOUT CONTRAST CT CERVICAL SPINE WITHOUT CONTRAST TECHNIQUE: Multidetector CT imaging of the head and cervical spine was performed following the standard protocol without intravenous contrast. Multiplanar CT image reconstructions of the cervical spine were also generated. COMPARISON:  None. FINDINGS: CT HEAD FINDINGS Moderate atrophy identified. No acute intracranial abnormalities are identified, including mass lesion or mass effect, hydrocephalus, extra-axial fluid collection, midline shift, hemorrhage, or acute infarction. Posterior left scalp soft tissue swelling is noted. The visualized bony calvarium is unremarkable. CT CERVICAL SPINE FINDINGS Normal alignment is noted. There is no evidence of acute fracture, subluxation or prevertebral soft tissue swelling. Degenerative disc disease, spondylosis and facet arthropathy throughout the cervical spine noted. Moderate-severe degenerative disc disease and spondylosis at C6-7 noted. No focal bony lesions are identified. IMPRESSION: No evidence of acute intracranial abnormality.  Atrophy. Posterior left scalp soft tissue swelling without fracture. No static evidence of acute injury to the cervical spine. Degenerative changes as described. Electronically Signed   By: Margarette Canada M.D.   On: 04/17/2015 20:09   Ct Cervical Spine Wo Contrast  04/17/2015  CLINICAL DATA:  74 year old female  with acute headache and cervical spine pain following fall today. EXAM: CT HEAD WITHOUT CONTRAST CT CERVICAL SPINE WITHOUT CONTRAST TECHNIQUE: Multidetector CT imaging of the head and cervical spine was performed following the standard protocol without intravenous contrast. Multiplanar CT image reconstructions of the cervical spine were also generated. COMPARISON:  None. FINDINGS: CT HEAD FINDINGS Moderate atrophy identified. No acute intracranial abnormalities are identified, including mass lesion or mass effect, hydrocephalus, extra-axial fluid collection, midline shift, hemorrhage, or acute infarction. Posterior left scalp soft tissue swelling is noted. The visualized bony calvarium is unremarkable. CT CERVICAL SPINE FINDINGS Normal alignment is noted. There is no evidence of acute fracture, subluxation or prevertebral soft tissue swelling. Degenerative disc disease, spondylosis and facet arthropathy throughout the cervical spine noted.  Moderate-severe degenerative disc disease and spondylosis at C6-7 noted. No focal bony lesions are identified. IMPRESSION: No evidence of acute intracranial abnormality.  Atrophy. Posterior left scalp soft tissue swelling without fracture. No static evidence of acute injury to the cervical spine. Degenerative changes as described. Electronically Signed   By: Margarette Canada M.D.   On: 04/17/2015 20:09   Ct Thoracic Spine Wo Contrast  04/17/2015  CLINICAL DATA:  74 year old female lost her balance and fell at home. T12 compression fracture on x-ray. Initial encounter. EXAM: CT THORACIC SPINE WITHOUT CONTRAST TECHNIQUE: Multidetector CT imaging of the thoracic spine was performed without intravenous contrast administration. Multiplanar CT image reconstructions were also generated. COMPARISON:  Lumbar radiographs from today.  Chest CT 02/19/2015. FINDINGS: Comminuted T12 compression fracture with deformity affecting the superior endplate is new from the recent chest CT in August.  Slight retropulsion of the posterior superior endplate without spinal stenosis (AP spinal canal maintained at 16 mm). Posterior elements intact. Loss of height 30%. Normal thoracic segmentation. Osteopenia. Other thoracic levels appears stable and intact. No posterior rib fracture identified. Visualized upper lumbar levels appear intact. Calcified atherosclerosis of the aorta. No pericardial or pleural effusion. No mediastinal lymphadenopathy. Cholelithiasis, 1.7 cm gallstone demonstrated. Otherwise negative visualized noncontrast upper abdominal viscera. Respiratory motion, negative lung windows. IMPRESSION: 1. Comminuted mild acute T12 compression fracture with superior endplate deformity. Minimal retropulsion of bone, no complicating features. 2. No other acute findings identified.  Cholelithiasis. Electronically Signed   By: Genevie Ann M.D.   On: 04/17/2015 22:12   I have personally reviewed and evaluated these images and lab results as part of my medical decision-making.   EKG Interpretation None      MDM   Final diagnoses:  Fall  T12 compression fracture, initial encounter ([place])  Abrasion of head, initial encounter   6:30 - Pt presenting after a mechanical fall. She landed on her buttocks and hit her head on the corner of her wall without LOC. She has an abrasion over the posterior occiput that does not need suture or staple repair. Pt complaining of headache and lumbar back pain. Denies weakness, numbness or pain in the legs. VSS. Pt nontoxic appearing. TTP over lumbar back. No spinous process deformities. FROM of lumbar spine intact. Non-focal neuro exam. Pt requesting tylenol. Will get head/neck CT and lumbar spine DG 8:15 - Head and neck CT unremarkable. Pt reports adequate control of pain with tylenol. 9:15 - Discussed lumbar xray results with pt and that we will need to further evaluate spine with CT. Pt requesting more pain medicine. Will give norco. 11:00 - Thoracic CT shows compression  fracture of T12. Consulted neurosurgery (Dr. Saintclair Halsted) who recommends TLSO back brace and outpatient follow up in 1 week. Discussed this with pt who agrees with plan. Will discharge with norco for pain control. Return precautions given in discharge paperwork and discussed with pt at bedside. Pt stable for discharge     Josephina Gip, PA-C 04/18/15 [place], DO 04/20/15 1540

## 2015-04-17 NOTE — ED Notes (Signed)
Bed: DE08 Expected date:  Expected time:  Means of arrival:  Comments: EMS- elderly fall/head lac/no thinners

## 2015-04-17 NOTE — ED Notes (Signed)
Patient transported to CT 

## 2015-04-18 MED ORDER — HYDROCODONE-ACETAMINOPHEN 5-325 MG PO TABS: 1.0000 | ORAL_TABLET | Freq: Four times a day (QID) | ORAL | Status: DC | PRN

## 2015-04-18 NOTE — Discharge Instructions (Signed)
Vertebral Fracture A vertebral fracture is a break in one of the bones that make up the spine (vertebrae). The vertebrae are stacked on top of each other to form the spinal column. They support the body and protect the spinal cord. The vertebral column has an upper part (cervical spine), a middle part (thoracic spine), and a lower part (lumbar spine). Most vertebral fractures occur in the thoracic spine or lumbar spine. There are three main types of vertebral fractures:  Flexion fracture. This happens when vertebrae collapse. Vertebrae can collapse:  In the front (compression fracture). This type of fracture is common in people who have a condition that causes their bones to be weak and brittle (osteoporosis). The fracture can make a person lose height.  In the front and back (axial burst fracture).  Extension fracture. This happens when an external force pulls apart the vertebrae.  Rotation fracture. This happens when the spine bends extremely in one direction. This type can cause a piece of a vertebra to break off (transverse process fracture) or move out of its normal position (fracture dislocation). This type of fracture has a high risk for spinal cord injury. Vertebral fractures can range from mild to very severe. The most severe types are those that cause the broken bones to move out of place (unstable) and those that injure or press on the spinal cord. CAUSES This condition is usually caused by a forceful injury. This type of injury commonly results from:  Car accidents.  Falling or jumping from a great height.  Collisions in contact sports.  Violent acts, such as an assault or a gunshot wound. RISK FACTORS This injury is more likely to happen to people who:  Have osteoporosis.  Participate in contact sports.  Are in situations that could result in falls or other violent injuries. SYMPTOMS Symptoms of this injury depend on the location and the type of fracture. The most common  symptom is back pain that gets worse with movement. You may also have trouble standing or walking. If a fracture has damaged your spinal cord or is pressing on it, you may also have:  Numbness.  Tingling.  Weakness.  Loss of movement.  Loss of bowel or bladder control. DIAGNOSIS This injury may be diagnosed based on symptoms, medical history, and a physical exam. You may also have imaging tests to confirm the diagnosis. These may include:  Spine X-ray.  CT scan.  MRI. TREATMENT Treatment for this injury depends on the type of fracture. If your fracture is stable and does not affect your spinal cord, it may heal with nonsurgical treatment, such as:  Taking pain medicine.  Wearing a cast or a brace.  Doing physical therapy exercises. If your vertebral fracture is unstable or it affects your spinal cord, you may need surgical treatment, such as:  Laminectomy. This procedure involves removing the part of a vertebra that is pushing on the spinal cord (spinal decompression surgery). Bone fragments may also be removed.  Spinal fusion. This procedure is used to stabilize an unstable fracture. Vertebrae may be joined together with a piece of bone from another part of your body (graft) and held in place with rods, plates, or screws.  Vertebroplasty. In this procedure, bone cement is used to rebuild collapsed vertebrae. HOME CARE INSTRUCTIONS General Instructions  Take medicines only as directed by your health care provider.  Do not drive or operate heavy machinery while taking pain medicine.  If directed, apply ice to the injured area:  Put  ice in a plastic bag.  Place a towel between your skin and the bag.  Leave the ice on for 30 minutes every two hours at first. Then apply the ice as needed.  Wear your neck brace or back brace as directed by your health care provider.  Do not drink alcohol. Alcohol can interfere with your treatment.  Keep all follow-up visits as directed  by your health care provider. This is important. It can help to prevent permanent injury, disability, and long-lasting (chronic) pain. Activity  Stay in bed (on bed rest) only as directed by your health care provider. Being on bed rest for too long can make your condition worse.  Return to your normal activities as directed by your health care provider. Ask what activities are safe for you.  Do exercises to improve motion and strength in your back (physical therapy), as recommended by your health care provider.   Exercise regularly as directed by your health care provider. SEEK MEDICAL CARE IF:  You have a fever.  You develop a cough that makes your pain worse.  Your pain medicine is not helping.  Your pain does not get better over time.  You cannot return to your normal activities as planned or expected. SEEK IMMEDIATE MEDICAL CARE IF:  Your pain is very bad and it suddenly gets worse.  You are unable to move any body part (paralysis) that is below the level of your injury.  You have numbness, tingling, or weakness in any body part that is below the level of your injury.  You cannot control your bladder or bowels.   This information is not intended to replace advice given to you by your health care provider. Make sure you discuss any questions you have with your health care provider.   Document Released: 07/30/2004 Document Revised: 11/06/2014 Document Reviewed: 06/27/2014 Elsevier Interactive Patient Education 2016 Elsevier [employer].  Thoracolumbosacral Orthosis Brace A thoracolumbosacral orthosis (TLSO) brace can be worn for different purposes. A TLSO brace can be worn to support the back. Your back may need more support if you had a broken bone in your back (fractured vertebrae), back surgery, or you cannot move (paralysis) your torso muscles. A TLSO brace can also be worn by a child to help prevent curving back problems from getting worse as the child grows. TLSO braces are  used to limit bending and twisting. The braces are made from a hard plastic. They are custom fit for each wearer. The TLSO brace covers both the front and back of the torso. On the back, the brace reaches from just above the tailbone to just below the shoulder blades. On the front, the brace covers the ribs and often reaches past the waist and over the hips. The brace can be worn under clothing.  HOME CARE INSTRUCTIONS  Ask your caregiver if you can remove your brace when showering or sleeping.  Follow your caregiver's instructions for putting on your brace.  Follow your caregiver's instructions about how much weight you can lift.  Always wear a T-shirt under the brace.  Make sure the brace is always well-aligned and fits you closely.  Check your skin daily for sores and redness caused by rubbing or pressure.  If you feel unsteady, use a cane or walker for support until you feel more steady.  Sit in high, firm chairs. It may be difficult to stand up from low, soft chairs.  Keep all follow-up appointments as directed by your caregiver. [place]  IF:  You have a fever.  You have shortness of breath.  You have chest pain.  You have numbness, tingling, or pain. Developed in conjunction with the [employer] at [facility] of [facility]   This information is not intended to replace advice given to you by your health care provider. Make sure you discuss any questions you have with your health care provider.   Document Released: 06/11/2011 Document Revised: 09/14/2011 Document Reviewed: 06/11/2011 Elsevier Interactive Patient Education 2016 [place] An abrasion is a cut or scrape on the outer surface of your skin. An abrasion does not extend through all of the layers of your skin. It is important to care for your abrasion properly to prevent infection. CAUSES Most abrasions are caused by falling on or gliding across  the ground or another surface. When your skin rubs on something, the outer and inner layer of skin rubs off.  SYMPTOMS A cut or scrape is the main symptom of this condition. The scrape may be bleeding, or it may appear red or pink. If there was an associated fall, there may be an underlying bruise. DIAGNOSIS An abrasion is diagnosed with a physical exam. TREATMENT Treatment for this condition depends on how large and deep the abrasion is. Usually, your abrasion will be cleaned with water and mild soap. This removes any dirt or debris that may be stuck. An antibiotic ointment may be applied to the abrasion to help prevent infection. A bandage (dressing) may be placed on the abrasion to keep it clean. You may also need a tetanus shot. HOME CARE INSTRUCTIONS Medicines  Take or apply medicines only as directed by your health care provider.  If you were prescribed an antibiotic ointment, finish all of it even if you start to feel better. Wound Care  Clean the wound with mild soap and water 2-3 times per day or as directed by your health care provider. Pat your wound dry with a clean towel. Do not rub it.  There are many different ways to close and cover a wound. Follow instructions from your health care provider about:  Wound care.  Dressing changes and removal.  Check your wound every day for signs of infection. Watch for:  Redness, swelling, or pain.  Fluid, blood, or pus. General Instructions  Keep the dressing dry as directed by your health care provider. Do not take baths, swim, use a hot tub, or do anything that would put your wound underwater until your health care provider approves.  If there is swelling, raise (elevate) the injured area above the level of your heart while you are sitting or lying down.  Keep all follow-up visits as directed by your health care provider. This is important. SEEK MEDICAL CARE IF:  You received a tetanus shot and you have swelling, severe pain,  redness, or bleeding at the injection site.  Your pain is not controlled with medicine.  You have increased redness, swelling, or pain at the site of your wound. SEEK IMMEDIATE MEDICAL CARE IF:  You have a red streak going away from your wound.  You have a fever.  You have fluid, blood, or pus coming from your wound.  You notice a bad smell coming from your wound or your dressing.   This information is not intended to replace advice given to you by your health care provider. Make sure you discuss any questions you have with your health care provider.   Document Released: 04/01/2005 Document Revised:  03/13/2015 Document Reviewed: 06/20/2014 Elsevier Interactive Patient Education [employer].

## 2015-04-18 NOTE — ED Notes (Signed)
Patient was alert, oriented and stable upon discharge. RN went over AVS and patient had no further questions.  

## 2015-05-09 ENCOUNTER — Encounter (INDEPENDENT_AMBULATORY_CARE_PROVIDER_SITE_OTHER): Payer: Medicare Other | Admitting: Ophthalmology

## 2015-05-09 DIAGNOSIS — H43813 Vitreous degeneration, bilateral: Secondary | ICD-10-CM | POA: Diagnosis not present

## 2015-05-09 DIAGNOSIS — H353132 Nonexudative age-related macular degeneration, bilateral, intermediate dry stage: Secondary | ICD-10-CM

## 2015-05-09 DIAGNOSIS — H34831 Tributary (branch) retinal vein occlusion, right eye, with macular edema: Secondary | ICD-10-CM

## 2015-05-09 DIAGNOSIS — H35033 Hypertensive retinopathy, bilateral: Secondary | ICD-10-CM

## 2015-05-09 DIAGNOSIS — I10 Essential (primary) hypertension: Secondary | ICD-10-CM | POA: Diagnosis not present

## 2015-05-09 DIAGNOSIS — H2511 Age-related nuclear cataract, right eye: Secondary | ICD-10-CM | POA: Diagnosis not present

## 2015-05-10 ENCOUNTER — Ambulatory Visit
Admission: RE | Admit: 2015-05-10 | Discharge: 2015-05-10 | Disposition: A | Discharge status: Home / Self Care | Payer: Medicare Other | Source: Ambulatory Visit

## 2015-05-10 DIAGNOSIS — Z1231 Encounter for screening mammogram for malignant neoplasm of breast: Secondary | ICD-10-CM

## 2015-06-06 ENCOUNTER — Encounter (INDEPENDENT_AMBULATORY_CARE_PROVIDER_SITE_OTHER): Payer: Medicare Other | Admitting: Ophthalmology

## 2015-06-06 DIAGNOSIS — H35033 Hypertensive retinopathy, bilateral: Secondary | ICD-10-CM

## 2015-06-06 DIAGNOSIS — H43813 Vitreous degeneration, bilateral: Secondary | ICD-10-CM | POA: Diagnosis not present

## 2015-06-06 DIAGNOSIS — I10 Essential (primary) hypertension: Secondary | ICD-10-CM

## 2015-06-06 DIAGNOSIS — H34831 Tributary (branch) retinal vein occlusion, right eye, with macular edema: Secondary | ICD-10-CM

## 2015-06-06 DIAGNOSIS — H353132 Nonexudative age-related macular degeneration, bilateral, intermediate dry stage: Secondary | ICD-10-CM | POA: Diagnosis not present

## 2015-06-27 ENCOUNTER — Other Ambulatory Visit: Payer: Self-pay | Admitting: Internal Medicine

## 2015-07-18 ENCOUNTER — Encounter (INDEPENDENT_AMBULATORY_CARE_PROVIDER_SITE_OTHER): Payer: Medicare Other | Admitting: Ophthalmology

## 2015-07-18 DIAGNOSIS — H353132 Nonexudative age-related macular degeneration, bilateral, intermediate dry stage: Secondary | ICD-10-CM

## 2015-07-18 DIAGNOSIS — H43813 Vitreous degeneration, bilateral: Secondary | ICD-10-CM

## 2015-07-18 DIAGNOSIS — H34831 Tributary (branch) retinal vein occlusion, right eye, with macular edema: Secondary | ICD-10-CM | POA: Diagnosis not present

## 2015-07-18 DIAGNOSIS — H2511 Age-related nuclear cataract, right eye: Secondary | ICD-10-CM | POA: Diagnosis not present

## 2015-07-18 DIAGNOSIS — H35033 Hypertensive retinopathy, bilateral: Secondary | ICD-10-CM

## 2015-07-18 DIAGNOSIS — I10 Essential (primary) hypertension: Secondary | ICD-10-CM | POA: Diagnosis not present

## 2015-07-31 ENCOUNTER — Other Ambulatory Visit: Payer: Self-pay | Admitting: Thoracic Surgery (Cardiothoracic Vascular Surgery)

## 2015-07-31 DIAGNOSIS — C3490 Malignant neoplasm of unspecified part of unspecified bronchus or lung: Secondary | ICD-10-CM

## 2015-08-20 ENCOUNTER — Ambulatory Visit (INDEPENDENT_AMBULATORY_CARE_PROVIDER_SITE_OTHER): Payer: Medicare Other | Admitting: Thoracic Surgery (Cardiothoracic Vascular Surgery)

## 2015-08-20 ENCOUNTER — Ambulatory Visit
Admission: RE | Admit: 2015-08-20 | Discharge: 2015-08-20 | Disposition: A | Discharge status: Home / Self Care | Payer: Medicare Other | Source: Ambulatory Visit | Attending: Thoracic Surgery (Cardiothoracic Vascular Surgery) | Admitting: Thoracic Surgery (Cardiothoracic Vascular Surgery)

## 2015-08-20 ENCOUNTER — Encounter: Payer: Self-pay | Admitting: Thoracic Surgery (Cardiothoracic Vascular Surgery)

## 2015-08-20 VITALS — BP 147/94 | HR 74 | Resp 16 | Ht 64.0 in | Wt 194.0 lb

## 2015-08-20 DIAGNOSIS — C3491 Malignant neoplasm of unspecified part of right bronchus or lung: Secondary | ICD-10-CM

## 2015-08-20 DIAGNOSIS — Z902 Acquired absence of lung [part of]: Secondary | ICD-10-CM | POA: Diagnosis not present

## 2015-08-20 DIAGNOSIS — C3490 Malignant neoplasm of unspecified part of unspecified bronchus or lung: Secondary | ICD-10-CM

## 2015-08-20 NOTE — Progress Notes (Signed)
[address]       [place] [address]             [phone]       HPI: [name] returns today for a scheduled follow-up visit.  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 I had to do a small thoracotomy and do the lobectomy with intermittent ventilation, because anesthesia was unable to place a double-lumen tube. I last saw her in the office in August for her one-year follow-up. She was having a lot of anxiety and stress dealing with her husband and his Alzheimer's disease. Her husband passed away in 04/26/23.  She also fell in 04-26-23 and had a T12 vertebral fracture. She's been wearing a back brace since then. She had bronchitis in January, she had a lot of coughing and wheezing during that time. Other than that episode she's not had any issues with her breathing. Her appetite is good. She has not had any weight loss, in fact she has gained weight. She has not had any unusual headaches or visual changes.  Past Medical History  Diagnosis Date  . Osteopenia   . Hypertriglyceridemia   . Depression with anxiety   . GERD (gastroesophageal reflux disease)   . OA (osteoarthritis)   . Colon polyp   . Atrophic vaginitis   . Kidney stones   . Contact lens/glasses fitting     wears contacts or glasses  . Asthma   . Chronic iritis of left eye     Pupil stays dilated; nonreactive  . PONV (postoperative nausea and vomiting)   . Anxiety   . Depression   . Environmental allergies   . Adenocarcinoma of lung, stage 1 ([place])     Status post right lower lobectomy August 2015      Current Outpatient Prescriptions  Medication Sig Dispense Refill  . albuterol (PROAIR HFA) 108 (90 BASE) MCG/ACT inhaler Inhale 2 puffs into the lungs every 6 (six) hours as needed for wheezing or shortness of breath. 1 Inhaler 4  . Calcium-Magnesium-Vitamin D (CALCIUM 1200+D3 PO) Take 1 tablet by mouth at bedtime.      . chlorpheniramine (CHLOR-TRIMETON) 4 MG tablet Take 4 mg by mouth at bedtime.    . cyclobenzaprine (FLEXERIL) 10 MG tablet Take 10 mg by mouth 3 (three) times daily as needed.   0  . dextromethorphan (DELSYM) 30 MG/5ML liquid Take 60 mg by mouth 2 (two) times daily as needed for cough.    . famotidine (PEPCID) 20 MG tablet Take 20 mg by mouth at bedtime.     . fenofibrate 160 MG tablet Take 160 mg by mouth daily.    Marland Kitchen FLUoxetine (PROZAC) 20 MG capsule Take 40 mg by mouth daily.     Marland Kitchen HYDROcodone-acetaminophen (NORCO/VICODIN) 5-325 MG tablet Take 1-2 tablets by mouth every 6 (six) hours as needed. 20 tablet 0  . losartan (COZAAR) 50 MG tablet Take 50 mg by mouth every morning.  12  . montelukast (SINGULAIR) 10 MG tablet Take 1 tablet (10 mg total) by mouth at bedtime. 90 tablet 0  . Multiple Vitamins-Minerals (CENTRUM SILVER PO) Take 1 tablet by mouth daily.    Marland Kitchen omeprazole (PRILOSEC) 40 MG capsule Take 40 mg by mouth daily.    Marland Kitchen Respiratory Therapy Supplies (FLUTTER) DEVI Use as directed 1 each 0  . solifenacin (VESICARE) 10 MG tablet Take 10 mg by  mouth daily.    . traZODone (DESYREL) 50 MG tablet Take 50 mg by mouth at bedtime.     No current facility-administered medications for this visit.    Physical Exam BP 147/94 mmHg  Pulse 74  Resp 16  Ht '5\' 4"'$  (1.626 m)  Wt 194 lb (87.998 kg)  BMI 33.28 kg/m2  SpO85 56% 75 year old woman in no acute distress Obese Alert and oriented 3 with no focal neurologic deficits Cardiac regular rate and rhythm normal S1 and S2 Lungs diminished breath sounds right base, otherwise clear Back brace in place around abdomen  Diagnostic Tests: CT CHEST WITHOUT CONTRAST  TECHNIQUE: Multidetector CT imaging of the chest was performed following the standard protocol without IV contrast.  COMPARISON: Multiple exams, the most recent of which dated 02/19/2015  FINDINGS: The lungs are well aerated bilaterally. The left lung again demonstrates  multiple scattered small nodules throughout the lung. All these measure less than 5 mm. Stable ground-glass changes are noted in the right apex. The largest of these again measures approximately 2.8 cm in greatest dimension. The scattered small pulmonary nodules seen are stable in appearance from the prior exam. Changes consistent with prior right lower lobectomy are again seen.  The thoracic inlet demonstrates a hypodense nodule within the thyroid which appears stable on the left.  Calcifications of the thoracic aorta are noted. No hilar or mediastinal adenopathy is seen.  The visualized upper abdomen shows evidence of cholelithiasis. No other focal abnormality is seen. The soft tissue nodule is again noted in the right breast which is stable from previous exams. No acute bony abnormality is seen. Chronic T12 compression deformity is noted.  IMPRESSION: Stable changes of prior right lower lobectomy.  Stable pulmonary nodules and ground-glass changes in both lungs when compared with the prior exam.  Cholelithiasis   Electronically Signed  By: Inez Catalina M.D.  On: 08/20/2015 10:36  I personally reviewed CT chest and concur with findings as noted above.  Impression: 75 year old woman who is now 18 months out from a right lower lobectomy for stage IA non-small cell carcinoma. She has multiple lung nodules and groundglass opacities in both lungs. We have been following those with CT since the time of surgery. They remain unchanged.  She has been through a lot but physically and emotionally with her husband passing away and then having a vertebral fracture. She seems to be handling that as well as possible.  She will need to continue with CT follow-up. I will see her back in 6 months for her 2 year follow-up visit and will get another CT scan at that time. If all remains stable, then we may be able to go to annual follow-up after that.   Plan: Return in 6 months with  CT chest  I spent 15 minutes with [name] during this visit, > 50% was spent in counseling Melrose Nakayama, MD [facility] [phone]

## 2015-08-22 ENCOUNTER — Encounter (INDEPENDENT_AMBULATORY_CARE_PROVIDER_SITE_OTHER): Payer: Medicare Other | Admitting: Ophthalmology

## 2015-08-22 DIAGNOSIS — H43813 Vitreous degeneration, bilateral: Secondary | ICD-10-CM

## 2015-08-22 DIAGNOSIS — H353132 Nonexudative age-related macular degeneration, bilateral, intermediate dry stage: Secondary | ICD-10-CM | POA: Diagnosis not present

## 2015-08-22 DIAGNOSIS — H34831 Tributary (branch) retinal vein occlusion, right eye, with macular edema: Secondary | ICD-10-CM | POA: Diagnosis not present

## 2015-08-22 DIAGNOSIS — I10 Essential (primary) hypertension: Secondary | ICD-10-CM | POA: Diagnosis not present

## 2015-08-22 DIAGNOSIS — H35033 Hypertensive retinopathy, bilateral: Secondary | ICD-10-CM

## 2015-09-26 ENCOUNTER — Encounter (INDEPENDENT_AMBULATORY_CARE_PROVIDER_SITE_OTHER): Payer: Medicare Other | Admitting: Ophthalmology

## 2015-09-26 DIAGNOSIS — H353132 Nonexudative age-related macular degeneration, bilateral, intermediate dry stage: Secondary | ICD-10-CM

## 2015-09-26 DIAGNOSIS — H34831 Tributary (branch) retinal vein occlusion, right eye, with macular edema: Secondary | ICD-10-CM | POA: Diagnosis not present

## 2015-09-26 DIAGNOSIS — H35033 Hypertensive retinopathy, bilateral: Secondary | ICD-10-CM | POA: Diagnosis not present

## 2015-09-26 DIAGNOSIS — H43813 Vitreous degeneration, bilateral: Secondary | ICD-10-CM

## 2015-09-26 DIAGNOSIS — I10 Essential (primary) hypertension: Secondary | ICD-10-CM

## 2015-09-26 DIAGNOSIS — H2513 Age-related nuclear cataract, bilateral: Secondary | ICD-10-CM

## 2015-10-31 ENCOUNTER — Encounter (INDEPENDENT_AMBULATORY_CARE_PROVIDER_SITE_OTHER): Payer: Medicare Other | Admitting: Ophthalmology

## 2015-10-31 DIAGNOSIS — H43813 Vitreous degeneration, bilateral: Secondary | ICD-10-CM

## 2015-10-31 DIAGNOSIS — H34831 Tributary (branch) retinal vein occlusion, right eye, with macular edema: Secondary | ICD-10-CM

## 2015-10-31 DIAGNOSIS — I10 Essential (primary) hypertension: Secondary | ICD-10-CM

## 2015-10-31 DIAGNOSIS — H2511 Age-related nuclear cataract, right eye: Secondary | ICD-10-CM

## 2015-10-31 DIAGNOSIS — H35033 Hypertensive retinopathy, bilateral: Secondary | ICD-10-CM

## 2015-10-31 DIAGNOSIS — H353132 Nonexudative age-related macular degeneration, bilateral, intermediate dry stage: Secondary | ICD-10-CM | POA: Diagnosis not present

## 2015-12-05 ENCOUNTER — Encounter (INDEPENDENT_AMBULATORY_CARE_PROVIDER_SITE_OTHER): Payer: Medicare Other | Admitting: Ophthalmology

## 2015-12-12 ENCOUNTER — Encounter (INDEPENDENT_AMBULATORY_CARE_PROVIDER_SITE_OTHER): Payer: Medicare Other | Admitting: Ophthalmology

## 2015-12-12 DIAGNOSIS — I10 Essential (primary) hypertension: Secondary | ICD-10-CM | POA: Diagnosis not present

## 2015-12-12 DIAGNOSIS — H4313 Vitreous hemorrhage, bilateral: Secondary | ICD-10-CM

## 2015-12-12 DIAGNOSIS — H34831 Tributary (branch) retinal vein occlusion, right eye, with macular edema: Secondary | ICD-10-CM

## 2015-12-12 DIAGNOSIS — H353132 Nonexudative age-related macular degeneration, bilateral, intermediate dry stage: Secondary | ICD-10-CM

## 2015-12-12 DIAGNOSIS — H2511 Age-related nuclear cataract, right eye: Secondary | ICD-10-CM | POA: Diagnosis not present

## 2015-12-12 DIAGNOSIS — H35033 Hypertensive retinopathy, bilateral: Secondary | ICD-10-CM | POA: Diagnosis not present

## 2016-01-16 ENCOUNTER — Encounter (INDEPENDENT_AMBULATORY_CARE_PROVIDER_SITE_OTHER): Payer: Medicare Other | Admitting: Ophthalmology

## 2016-01-16 DIAGNOSIS — H2511 Age-related nuclear cataract, right eye: Secondary | ICD-10-CM | POA: Diagnosis not present

## 2016-01-16 DIAGNOSIS — H353132 Nonexudative age-related macular degeneration, bilateral, intermediate dry stage: Secondary | ICD-10-CM

## 2016-01-16 DIAGNOSIS — H35033 Hypertensive retinopathy, bilateral: Secondary | ICD-10-CM | POA: Diagnosis not present

## 2016-01-16 DIAGNOSIS — H34831 Tributary (branch) retinal vein occlusion, right eye, with macular edema: Secondary | ICD-10-CM

## 2016-01-16 DIAGNOSIS — H43813 Vitreous degeneration, bilateral: Secondary | ICD-10-CM | POA: Diagnosis not present

## 2016-01-16 DIAGNOSIS — I10 Essential (primary) hypertension: Secondary | ICD-10-CM | POA: Diagnosis not present

## 2016-01-20 ENCOUNTER — Other Ambulatory Visit: Payer: Self-pay | Admitting: Thoracic Surgery (Cardiothoracic Vascular Surgery)

## 2016-01-20 DIAGNOSIS — R918 Other nonspecific abnormal finding of lung field: Secondary | ICD-10-CM

## 2016-02-20 ENCOUNTER — Encounter (INDEPENDENT_AMBULATORY_CARE_PROVIDER_SITE_OTHER): Payer: Medicare Other | Admitting: Ophthalmology

## 2016-02-20 DIAGNOSIS — H35033 Hypertensive retinopathy, bilateral: Secondary | ICD-10-CM | POA: Diagnosis not present

## 2016-02-20 DIAGNOSIS — I10 Essential (primary) hypertension: Secondary | ICD-10-CM | POA: Diagnosis not present

## 2016-02-20 DIAGNOSIS — H353123 Nonexudative age-related macular degeneration, left eye, advanced atrophic without subfoveal involvement: Secondary | ICD-10-CM

## 2016-02-20 DIAGNOSIS — H35371 Puckering of macula, right eye: Secondary | ICD-10-CM

## 2016-02-20 DIAGNOSIS — H43813 Vitreous degeneration, bilateral: Secondary | ICD-10-CM

## 2016-02-20 DIAGNOSIS — H34831 Tributary (branch) retinal vein occlusion, right eye, with macular edema: Secondary | ICD-10-CM | POA: Diagnosis not present

## 2016-02-25 ENCOUNTER — Encounter: Payer: Self-pay | Admitting: Thoracic Surgery (Cardiothoracic Vascular Surgery)

## 2016-02-25 ENCOUNTER — Other Ambulatory Visit: Payer: Self-pay | Admitting: Thoracic Surgery (Cardiothoracic Vascular Surgery)

## 2016-02-25 ENCOUNTER — Ambulatory Visit
Admission: RE | Admit: 2016-02-25 | Discharge: 2016-02-25 | Disposition: A | Discharge status: Home / Self Care | Payer: Medicare Other | Source: Ambulatory Visit | Attending: Thoracic Surgery (Cardiothoracic Vascular Surgery) | Admitting: Thoracic Surgery (Cardiothoracic Vascular Surgery)

## 2016-02-25 ENCOUNTER — Other Ambulatory Visit: Payer: Self-pay | Admitting: *Deleted

## 2016-02-25 ENCOUNTER — Ambulatory Visit (INDEPENDENT_AMBULATORY_CARE_PROVIDER_SITE_OTHER): Payer: Medicare Other | Admitting: Thoracic Surgery (Cardiothoracic Vascular Surgery)

## 2016-02-25 ENCOUNTER — Other Ambulatory Visit: Payer: Medicare Other

## 2016-02-25 VITALS — BP 139/95 | HR 73 | Resp 16 | Ht 64.0 in | Wt 205.0 lb

## 2016-02-25 DIAGNOSIS — C3491 Malignant neoplasm of unspecified part of right bronchus or lung: Secondary | ICD-10-CM | POA: Diagnosis not present

## 2016-02-25 DIAGNOSIS — Z902 Acquired absence of lung [part of]: Secondary | ICD-10-CM | POA: Diagnosis not present

## 2016-02-25 DIAGNOSIS — R918 Other nonspecific abnormal finding of lung field: Secondary | ICD-10-CM

## 2016-02-25 DIAGNOSIS — N63 Unspecified lump in unspecified breast: Secondary | ICD-10-CM

## 2016-02-25 NOTE — Progress Notes (Signed)
[address]       [place] [address]             [phone]       HPI: [name] returns today for a 2 year follow-up visit  She is a 75 year old woman who had a right lower lobectomy for a stage IA non-small cell carcinoma in August 2015. I last saw her in the office in February of this year. She had no evidence of recurrent disease.  She says she's been feeling reasonably well lately. She has not had any issues with her breathing. She has been having some left shoulder pain and may need rotator cuff surgery. She denies any problems with her appetite or weight loss. She recently had cataract surgery which was uncomplicated.  Past Medical History:  Diagnosis Date  . Adenocarcinoma of lung, stage 1 ([place])    Status post right lower lobectomy August 2015  . Anxiety   . Asthma   . Atrophic vaginitis   . Chronic iritis of left eye    Pupil stays dilated; nonreactive  . Colon polyp   . Contact lens/glasses fitting    wears contacts or glasses  . Depression   . Depression with anxiety   . Environmental allergies   . GERD (gastroesophageal reflux disease)   . Hypertriglyceridemia   . Kidney stones   . OA (osteoarthritis)   . Osteopenia   . PONV (postoperative nausea and vomiting)       Current Outpatient Prescriptions  Medication Sig Dispense Refill  . albuterol (PROAIR HFA) 108 (90 BASE) MCG/ACT inhaler Inhale 2 puffs into the lungs every 6 (six) hours as needed for wheezing or shortness of breath. 1 Inhaler 4  . Calcium-Magnesium-Vitamin D (CALCIUM 1200+D3 PO) Take 1 tablet by mouth at bedtime.    . chlorpheniramine (CHLOR-TRIMETON) 4 MG tablet Take 4 mg by mouth at bedtime.    . cyclobenzaprine (FLEXERIL) 10 MG tablet Take 10 mg by mouth 3 (three) times daily as needed.   0  . dextromethorphan (DELSYM) 30 MG/5ML liquid Take 60 mg by mouth 2 (two) times daily as needed for cough.    . famotidine (PEPCID) 20 MG tablet Take 20 mg by mouth at  bedtime.     . fenofibrate 160 MG tablet Take 160 mg by mouth daily.    Marland Kitchen FLUoxetine (PROZAC) 20 MG capsule Take 40 mg by mouth daily.     Marland Kitchen HYDROcodone-acetaminophen (NORCO/VICODIN) 5-325 MG tablet Take 1-2 tablets by mouth every 6 (six) hours as needed. 20 tablet 0  . losartan (COZAAR) 50 MG tablet Take 50 mg by mouth every morning.  12  . montelukast (SINGULAIR) 10 MG tablet Take 1 tablet (10 mg total) by mouth at bedtime. 90 tablet 0  . Multiple Vitamins-Minerals (CENTRUM SILVER PO) Take 1 tablet by mouth daily.    Marland Kitchen omeprazole (PRILOSEC) 40 MG capsule Take 40 mg by mouth daily.    Marland Kitchen Respiratory Therapy Supplies (FLUTTER) DEVI Use as directed 1 each 0  . solifenacin (VESICARE) 10 MG tablet Take 10 mg by mouth daily.    . traZODone (DESYREL) 50 MG tablet Take 50 mg by mouth at bedtime.     No current facility-administered medications for this visit.     Physical Exam BP (!) 139/95   Pulse 73   Resp 16   Ht '5\' 4"'$  (1.626 m)   Wt 205 lb (93 kg)   SpO2 97% Comment: RA  BMI 35.19 kg/m  Obese 75 year old woman in no acute distress Alert and oriented 3 no focal neurologic deficits No cervical or supraclavicular adenopathy Lungs diminished breath sounds at right base, otherwise clear Cardiac regular rate and rhythm normal S1 and S2  Diagnostic Tests: CT CHEST WITHOUT CONTRAST  TECHNIQUE: Multidetector CT imaging of the chest was performed following the standard protocol without IV contrast.  COMPARISON:  08/20/2015  FINDINGS: Cardiovascular: Heart size normal. Coronary artery calcification is noted. No pericardial effusion Atherosclerotic calcification is noted in the wall of the throracic aorta.  Mediastinum/Nodes: No mediastinal lymphadenopathy. No evidence for gross hilar lymphadenopathy although assessment is limited by the lack of intravenous contrast on today's study. The esophagus has normal imaging features. 12 mm left thyroid nodule is stable in the interval.  There is no axillary lymphadenopathy.  Lungs/Pleura: Fine detail in the lungs is obscured by breathing motion. Scattered areas of ground-glass attenuation and bilateral pulmonary nodules show no substantial interval change. 4 mm right upper lobe pulmonary nodule on image 38 series 5 is stable. 4 mm right lung nodule seen on image 61 series 5 is unchanged. Tiny central left lung nodules (infrahilar images 70 09-1978) are stable, but more conspicuous on today's study given the thinner slice collimation. 4 mm left upper lobe nodule seen on image 37 is unchanged. No focal airspace consolidation. No pulmonary edema or pleural effusion.  Upper Abdomen: Unremarkable. Gallstones seen on the previous study have not been included on today's exam. Bone windows reveal no worrisome lytic or sclerotic osseous lesions.  Musculoskeletal: 15 mm soft tissue nodule identified in the 12 o'clock position of the right breast.  IMPRESSION: 1. Stable appearance bilateral pulmonary nodules, some of which are more conspicuous on today's study given the thinner slice collimation. 2. Stable appearance scattered ground-glass opacities, also more conspicuous given the thinner slices on today's exam. 3. 15 mm soft tissue nodule 12 o'clock position of the right breast. Correlation with mammographic screening history recommended. These results will be called to the ordering clinician or representative by the Radiologist Assistant, and communication documented in the PACS or zVision Dashboard. Some   Electronically Signed   By: Misty Stanley M.D.   On: 02/25/2016 14:22 I personally reviewed the CT chest and concur with findings as noted above.  Impression: 75 year old woman with a history of a stage IA non-small cell carcinoma treated with right lower lobectomy in August 2015. She is doing well at this point in time with no evidence of recurrent disease on exam or by CT.  She does have a soft tissue  nodule in the right breast noted on CT by the radiologist. I discussed this finding with [name]. She's not had a mammogram in almost a year. I'm going to go ahead and order a mammogram to look at that area more closely and instructed her to follow-up with Dr. Rex Kras.  Plan: Return in one year with CT chest  Melrose Nakayama, MD [facility] [phone]

## 2016-03-03 ENCOUNTER — Ambulatory Visit
Admission: RE | Admit: 2016-03-03 | Discharge: 2016-03-03 | Disposition: A | Discharge status: Home / Self Care | Payer: Medicare Other | Source: Ambulatory Visit | Attending: Thoracic Surgery (Cardiothoracic Vascular Surgery) | Admitting: Thoracic Surgery (Cardiothoracic Vascular Surgery)

## 2016-03-03 ENCOUNTER — Other Ambulatory Visit: Payer: Medicare Other

## 2016-03-03 DIAGNOSIS — N63 Unspecified lump in unspecified breast: Secondary | ICD-10-CM

## 2016-03-23 ENCOUNTER — Encounter (INDEPENDENT_AMBULATORY_CARE_PROVIDER_SITE_OTHER): Payer: Medicare Other | Admitting: Ophthalmology

## 2016-03-23 DIAGNOSIS — H35371 Puckering of macula, right eye: Secondary | ICD-10-CM

## 2016-03-23 DIAGNOSIS — H34831 Tributary (branch) retinal vein occlusion, right eye, with macular edema: Secondary | ICD-10-CM | POA: Diagnosis not present

## 2016-03-23 DIAGNOSIS — H43813 Vitreous degeneration, bilateral: Secondary | ICD-10-CM | POA: Diagnosis not present

## 2016-03-23 DIAGNOSIS — I10 Essential (primary) hypertension: Secondary | ICD-10-CM

## 2016-03-23 DIAGNOSIS — H35033 Hypertensive retinopathy, bilateral: Secondary | ICD-10-CM | POA: Diagnosis not present

## 2016-03-26 ENCOUNTER — Encounter (INDEPENDENT_AMBULATORY_CARE_PROVIDER_SITE_OTHER): Payer: Medicare Other | Admitting: Ophthalmology

## 2016-04-30 ENCOUNTER — Encounter (INDEPENDENT_AMBULATORY_CARE_PROVIDER_SITE_OTHER): Payer: Medicare Other | Admitting: Ophthalmology

## 2016-04-30 DIAGNOSIS — H34831 Tributary (branch) retinal vein occlusion, right eye, with macular edema: Secondary | ICD-10-CM | POA: Diagnosis not present

## 2016-04-30 DIAGNOSIS — H43813 Vitreous degeneration, bilateral: Secondary | ICD-10-CM

## 2016-04-30 DIAGNOSIS — H353132 Nonexudative age-related macular degeneration, bilateral, intermediate dry stage: Secondary | ICD-10-CM

## 2016-04-30 DIAGNOSIS — H35033 Hypertensive retinopathy, bilateral: Secondary | ICD-10-CM | POA: Diagnosis not present

## 2016-04-30 DIAGNOSIS — I10 Essential (primary) hypertension: Secondary | ICD-10-CM | POA: Diagnosis not present

## 2016-05-12 ENCOUNTER — Other Ambulatory Visit: Payer: Medicare Other

## 2016-05-12 ENCOUNTER — Ambulatory Visit (INDEPENDENT_AMBULATORY_CARE_PROVIDER_SITE_OTHER): Payer: Medicare Other | Admitting: Internal Medicine

## 2016-05-12 ENCOUNTER — Encounter: Payer: Self-pay | Admitting: Internal Medicine

## 2016-05-12 ENCOUNTER — Ambulatory Visit (INDEPENDENT_AMBULATORY_CARE_PROVIDER_SITE_OTHER)
Admission: RE | Admit: 2016-05-12 | Discharge: 2016-05-12 | Disposition: A | Discharge status: Home / Self Care | Payer: Medicare Other | Source: Ambulatory Visit | Attending: Internal Medicine | Admitting: Internal Medicine

## 2016-05-12 VITALS — BP 156/92 | HR 94 | Ht 64.0 in | Wt 213.8 lb

## 2016-05-12 DIAGNOSIS — R05 Cough: Secondary | ICD-10-CM

## 2016-05-12 DIAGNOSIS — R059 Cough, unspecified: Secondary | ICD-10-CM

## 2016-05-12 LAB — NITRIC OXIDE: Nitric Oxide: 24

## 2016-05-12 MED ORDER — METHYLPREDNISOLONE ACETATE 80 MG/ML IJ SUSP
120.0000 mg | Freq: Once | INTRAMUSCULAR | Status: AC
  Administered 2016-05-12: 120 mg via INTRAMUSCULAR

## 2016-05-12 MED ORDER — HYDROCODONE-ACETAMINOPHEN 5-325 MG PO TABS: 1.0000 | ORAL_TABLET | Freq: Four times a day (QID) | ORAL | 0 refills | Status: DC | PRN

## 2016-05-12 NOTE — Progress Notes (Signed)
Subjective:   Patient ID: [name], female    DOB: October 23, 1940  MRN: [id number]   Brief patient profile:  75 yowf quit smoking 1980 allergies itching and sneezing took shots much improved when stopped shots and stayed  much better except some problem spring and fall and did ok until 40's then developed recurrent cough typically assoc with URI's and not with spring and fall  But much worse since  Early 2014 despite rx for  Asthma and gerd so referred 05/30/2013 to pulmonary clinic by Dr Lawernce Pitts    History of Present Illness  05/30/2013 1st [facility] office visit/ [name] cc daily cough x months which is so severe gets gagged and occ vomit and pos urinary incontinent.  Clear mucus esp p shower, no better with inhalers and acid suppression to date  rec  First take delsym two tsp every 12 hours and supplement if needed with  tramadol 50 mg up to 2 every 4 hours to suppress the urge to cough at all or even clear your throat. . Once you have eliminated the cough for 3 straight days try reducing the tramadol first,  then the delsym as tolerated.   Try prilosec '20mg'$   Take x 2  30-60 min before first meal of the day and Pepcid 20 mg one bedtime  Prednisone 10 mg take  4 each am x 2 days,   2 each am x 2 days,  1 each am x 2 days and stop   GERD diet    06/27/2013 f/u ov/[name] re: 0 % better chronic cough x Jan 2014 / using saba for cough Chief Complaint  Patient presents with  . Follow-up    Pt states her SOB and wheezing are unchanged. Her cough is slightly improved since last visit. No new co's.    rec  schedule sinus ct> mild chronic changes only  Only use your albuterol/proaire as a rescue medication  First take delsym two tsp every 12 hours and supplement if needed with  Tylenol #3 one every 4 hours to suppress the urge to cough at all or even clear your throat.   Once you have eliminated the cough for 3 straight days try reducing the tylenol #3 ,  then the delsym as tolerated.   Keep flutter valve handy and use it when you start cough  Omeprazole 40 mg  Take x 2  30-60 min before first meal of the day and Pepcid 20 mg one bedtime and chlotrimeton 4 mg one at bedtime GERD diet   Please schedule a follow up office visit in 4 weeks, sooner if needed bring all medications with you when return.    07/25/2013 f/u ov/[name] re: cough x > one year / did not bring all meds "they are the same" Chief Complaint  Patient presents with  . Follow-up    Pt states her SOB and wheezing have improved some but her cough is some worse. Cough is prod with very minimal clear sputum.     >>prednisone taper , PPI /GERD, cough control rx   08/08/2013 Follow up and Med review  Patient returns for a followup and medication review. We reviewed all her medications and organized them into a medication calendar with patient education. Last visit. Patient was seen for persistent cough. She was placed on aggressive reflux, and rhinitis prevention regimen. Along with cough control, which appears patient reports that she is improved. Her shortness, of breath and wheezing have resolved, and her dry cough  has improved substantially.[place] and broke wrist last week requiring surgery.  rec No change rx    09/05/2013 f/u ov/[name] re: chronic cough  Chief Complaint  Patient presents with  . Follow-up    Pt states that her breathing is doing well. Her cough is unchanged since her last visit.   Worse cough is sometimes p shower but otherwise not really coughing much vs baseline  and no excess or bloody/purulent mucus.  Not limited by breathing from desired activities   rec 1st gen H1   10/13/2013 f/u ov/[name] re: cough x one year, worse p shower then early evening, h1 not helping  Chief Complaint  Patient presents with  . Follow-up    Review CT from earlier this week.  C/o sinus congestion, PND, nonprod cough X3 weeks.      questionaire (unchanged pattern from previous ov)   Kouffman Reflux v Neurogenic  Cough Differentiator Reflux Comments  Do you awaken from a sound sleep coughing violently?                            With trouble breathing?  not now   Do you have choking episodes when you cannot  Get enough air, gasping for air ?              Much better   Do you usually cough when you lie down into  The bed, or when you just lie down to rest ?                          Recliner x 38m Due to back   Do you usually cough after meals or eating?         sometime   Do you cough when (or after) you bend over?    sometime   GERD SCORE     Kouffman Reflux v Neurogenic Cough Differentiator Neurogenic   Do you more-or-less cough all day long? Not now   Does change of temperature make you cough? yes   Does laughing or chuckling cause you to cough? yes   Do fumes (perfume, automobile fumes, burned  Toast, etc.,) cause you to cough ?      no   Does speaking, singing, or talking on the phone cause you to cough   ?               yes   Neurogenic/Airway score    rec Depomedrol 120 mg IM  singulair 10 mg one daily in evening    11/10/2013 f/u ov/[name] re: chronic cough / MPN's Chief Complaint  Patient presents with  . Follow-up    Pt states that her cough is unchanged since her last visit. No new co's today.   no noct cough at all, p stirring x 5 min then cough > dry but this is really minimal compared to previous Never produces any mucus rec See calendar for specific medication instructions    We will call you for follow up CT in first week in July.   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 Acute Visit    Increased cough and wheezing x 1 month. Cough is rarely  prod but sputum has been clear.    cough quiets down overnight p hs h1  then only after stirs again in am Worse from heat to cold/ talking also brings it on  No better with albuterol / singulair     No obvious other patterns in day to day or daytime variabilty or assoc sob or excess/ purulent sputum or mucus plugs   cp or chest tightness, subjective wheeze overt sinus or hb symptoms. No unusual exp hx or h/o childhood pna/ asthma or knowledge of premature birth.  Sleeping ok without nocturnal  or early am exacerbation  of respiratory  c/o's or need for noct saba. Also denies any obvious fluctuation of symptoms with weather or environmental changes or other aggravating or alleviating factors except as outlined above   Current Medications, Allergies, Complete Past Medical History, Past Surgical History, Family History, and Social History were reviewed in [employer] record.  ROS  The following are not active complaints unless bolded sore throat, dysphagia, dental problems, itching, sneezing,  nasal congestion or excess/ purulent secretions, ear ache,   fever, chills, sweats, unintended wt loss, pleuritic or exertional cp, hemoptysis,  orthopnea pnd or leg swelling, presyncope, palpitations, heartburn, abdominal pain, anorexia, nausea, vomiting, diarrhea  or change in bowel or urinary habits, change in stools or urine, dysuria,hematuria,  rash, arthralgias, visual complaints, headache, numbness weakness or ataxia or problems with walking or coordination,  change in mood/affect or memory.                  Objective:   Physical Exam  amb obese hoarse wf nad  / pseudowheezing    Vital signs reviewed   07/25/2013       197>>196 08/08/2013 > 09/05/2013 198 > 10/13/2013  205 > 11/10/2013  208 > 05/12/2016  214       HEENT: nl dentition, turbinates, and orophanx. Nl external ear canals without cough reflex   NECK :  without JVD/Nodes/TM/ nl carotid upstrokes  bilaterally   LUNGS: no acc muscle use, clear to A and P bilaterally with cough on insp  maneuvers   CV:  RRR  no s3 or murmur or increase in P2, no edema   ABD:  soft and nontender with nl excursion in the supine position. No bruits or organomegaly, bowel sounds nl  MS:  warm without deformities, calf tenderness, cyanosis or clubbing     CXR PA and Lateral:   05/12/2016 :    I personally reviewed images and agree with radiology impression as follows:    Low lung volumes. Stable right base pleural thickening consistent scarring. No acute abnormality.   Labs ordered 05/12/2016  Allergy profile, cbc with diff       Assessment & Plan:

## 2016-05-12 NOTE — Patient Instructions (Addendum)
The key to effective treatment for your cough is eliminating the non-stop cycle of cough you're stuck in long enough to let your airway heal completely and then see if there is anything still making you cough once you stop the cough suppression, but this should take no more than 5 days to figure out  First take delsym two tsp every 12 hours and supplement if needed with  vicodin  1-2 every 4 hours to suppress the urge to cough at all or even clear your throat. Swallowing water or using ice chips/non mint and menthol containing candies (such as lifesavers or sugarless jolly ranchers) are also effective.  You should rest your voice and avoid activities that you know make you cough.  Once you have eliminated the cough for 3 straight days try reducing the vicodin first,  then the delsym as tolerated.    Depomedrol 120 mg IM today   Prilosec 40 mg take 30-60 min before first meal of the day and Pepcid 20 mg one bedtime plus chlorpheniramine 4 mg x 2 at bedtime (both available over the counter)  until cough is completely gone for at least a week without the need for cough suppression  GERD (REFLUX)  is an extremely common cause of respiratory symptoms, many times with no significant heartburn at all.    It can be treated with medication, but also with lifestyle changes including avoidance of late meals, excessive alcohol, smoking cessation, and avoid fatty foods, chocolate, peppermint, colas, red wine, and acidic juices such as orange juice.  NO MINT OR MENTHOL PRODUCTS SO NO COUGH DROPS   USE HARD CANDY INSTEAD (jolley ranchers or Stover's or Lifesavers (all available in sugarless versions) NO OIL BASED VITAMINS - use powdered substitutes.   Please remember to go to the lab and x-ray department downstairs for your tests - we will call you with the results when they are available.   See Tammy NP  in 2 weeks or first available after that  with all your medications, even over the counter meds, separated  in two separate bags, the ones you take no matter what vs the ones you stop once you feel better and take only as needed when you feel you need them.   Tammy  will generate for you a new user friendly medication calendar that will put [place] all on the same page re: your medication use.     Without this process, it simply isn't possible to assure that we are providing  your outpatient care  with  the attention to detail we feel you deserve.   If we cannot assure that you're getting that kind of care,  then we cannot manage your problem effectively from this clinic.  Once you have seen Tammy and we are sure that we're all on the same page with your medication use she will arrange follow up with me.  Late add consider trial of gabapentin 100 mg tid on return

## 2016-05-13 LAB — RESPIRATORY ALLERGY PROFILE REGION II ~~LOC~~
Allergen, A. alternata, m6: <0.1 kU/L
Allergen, C. Herbarum, M2: <0.1 kU/L
Allergen, Cedar tree, t12: <0.1 kU/L
Allergen, Comm Silver Birch, t9: 1.1 kU/L — ABNORMAL HIGH
Allergen, Cottonwood, t14: <0.1 kU/L
Allergen, D pternoyssinus,d7: <0.1 kU/L
Allergen, Mouse Urine Protein, e78: <0.1 kU/L
Allergen, Mulberry, t76: <0.1 kU/L
Allergen, Oak,t7: 0.14 kU/L — ABNORMAL HIGH
Allergen, P. notatum, m1: <0.1 kU/L
Aspergillus fumigatus, m3: <0.1 kU/L
Bermuda Grass: <0.1 kU/L
Box Elder IgE: <0.1 kU/L
Cat Dander: <0.1 kU/L
Cockroach: <0.1 kU/L
Common Ragweed: 0.28 kU/L — ABNORMAL HIGH
D. farinae: <0.1 kU/L
Dog Dander: <0.1 kU/L
Elm IgE: <0.1 kU/L
IgE (Immunoglobulin E), Serum: 47 kU/L (ref <115)
Johnson Grass: <0.1 kU/L
Pecan/Hickory Tree IgE: <0.1 kU/L
Rough Pigweed  IgE: <0.1 kU/L
Sheep Sorrel IgE: <0.1 kU/L
Timothy Grass: <0.1 kU/L

## 2016-05-13 NOTE — Assessment & Plan Note (Addendum)
- Spirometry 02/23/13 wnl  - 07/03/2013 Sinus CT > Mild chronic sinus disease as described. No acute findings. Left-to-right nasal septal deviation of 3 mm. -med calendar 08/08/13  - Eos 4%   10/13/2013  > rec singulair daily  - FENO 05/12/2016  =   24 on singulair  - Allergy profile 05/12/2016 >  Eos 0. 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   These conditions, singly or in combination, have accounted for up to 94% of the causes of chronic cough in prospective studies.   Other conditions have constituted no >6% of the causes in prospective studies These have included bronchogenic carcinoma, chronic interstitial pneumonia, sarcoidosis, left ventricular failure, ACEI-induced cough, and aspiration from a condition associated with pharyngeal dysfunction.    Chronic cough is often simultaneously caused by more than one condition. A single cause has been found from 38 to 82% of the time, multiple causes from 18 to 62%. Multiply caused cough has been the result of three diseases up to 42% of the time.       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   These are the same pts (now being labeled as having "irritable larynx syndrome" by some cough centers) who not infrequently have a history of having failed to tolerate ace inhibitors,  dry powder inhalers or biphosphonates or report having atypical/extraesophageal reflux symptoms that don't respond to standard doses of PPI  and are easily confused as having aecopd or asthma flares by even experienced allergists/ pulmonologists (myself included).    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  I had an extended discussion with the patient reviewing all relevant studies completed to date and  lasting 25 minutes of a 40  minute acute visit addressing a refractory non-specific persistent symptom   The standardized cough guidelines published in Chest by Lissa Morales in 2006 are still the best available and consist of a multiple step process (up to 12!) , not a single office visit,  and are intended  to address this problem logically,  with an alogrithm dependent on response to empiric treatment at  each progressive step  to determine a specific diagnosis with  minimal addtional testing needed. Therefore if adherence is an issue or can't be accurately verified,  it's very unlikely the standard evaluation and treatment  will be successful here.    Furthermore, response to therapy (other than acute cough suppression, which should only be used short term with avoidance of narcotic containing cough syrups if possible), can be a gradual process for which the patient is not likely to  perceive immediate benefit.  Unlike going to an eye doctor where the best perscription is almost always  the first one and is immediately effective, this is almost never the case in the management of chronic cough syndromes. Therefore the patient needs to commit up front to consistently adhere to recommendations  for up to 6 weeks of therapy directed at the likely underlying problem(s) before the response can be reasonably evaluated.    Each maintenance medication was reviewed in detail including most importantly the difference between maintenance and prns and under what circumstances the prns are to be triggered using an action plan format that is not reflected in the computer generated alphabetically organized AVS.    Please see instructions for details which were reviewed in writing and the patient given a copy highlighting the part that I personally wrote and discussed at today's ov.

## 2016-05-13 NOTE — Progress Notes (Signed)
Spoke with [name] and notified of results per Dr. Wert. [name] verbalized understanding and denied any questions. 

## 2016-05-14 ENCOUNTER — Telehealth: Payer: Self-pay | Admitting: Internal Medicine

## 2016-05-14 NOTE — Progress Notes (Signed)
LMTCB

## 2016-05-14 NOTE — Telephone Encounter (Signed)
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 ------------------------------------------------------------------------ Spoke with pt. She is aware of results. Nothing further was needed.

## 2016-05-26 ENCOUNTER — Encounter: Payer: Medicare Other | Admitting: Adult Health

## 2016-05-26 ENCOUNTER — Encounter (INDEPENDENT_AMBULATORY_CARE_PROVIDER_SITE_OTHER): Payer: Medicare Other | Admitting: Ophthalmology

## 2016-05-26 DIAGNOSIS — H34831 Tributary (branch) retinal vein occlusion, right eye, with macular edema: Secondary | ICD-10-CM | POA: Diagnosis not present

## 2016-05-26 DIAGNOSIS — H35033 Hypertensive retinopathy, bilateral: Secondary | ICD-10-CM | POA: Diagnosis not present

## 2016-05-26 DIAGNOSIS — H43813 Vitreous degeneration, bilateral: Secondary | ICD-10-CM | POA: Diagnosis not present

## 2016-05-26 DIAGNOSIS — I10 Essential (primary) hypertension: Secondary | ICD-10-CM

## 2016-05-26 DIAGNOSIS — H353132 Nonexudative age-related macular degeneration, bilateral, intermediate dry stage: Secondary | ICD-10-CM | POA: Diagnosis not present

## 2016-06-01 ENCOUNTER — Ambulatory Visit (INDEPENDENT_AMBULATORY_CARE_PROVIDER_SITE_OTHER): Payer: Medicare Other | Admitting: Adult Health

## 2016-06-01 ENCOUNTER — Encounter: Payer: Self-pay | Admitting: Adult Health

## 2016-06-01 DIAGNOSIS — R05 Cough: Secondary | ICD-10-CM

## 2016-06-01 DIAGNOSIS — R059 Cough, unspecified: Secondary | ICD-10-CM

## 2016-06-01 MED ORDER — HYDROCODONE-ACETAMINOPHEN 5-325 MG PO TABS: 1.0000 | ORAL_TABLET | Freq: Four times a day (QID) | ORAL | 0 refills | Status: DC | PRN

## 2016-06-01 NOTE — Progress Notes (Signed)
Chart and office note reviewed in detail  > agree with a/p as outlined    

## 2016-06-01 NOTE — Patient Instructions (Signed)
Follow med calendar closely and bring to each visit.  Continue on cough control regimen . Use caution with pain meds as these meds can make you sleepy and off balance. As cough gets better continue to wean off hydrocodone .  Follow up Dr. Melvyn Novas  In 2-3 months and As needed

## 2016-06-01 NOTE — Assessment & Plan Note (Signed)
Recent flare  ? AR/GERD triggers. Doing better with cough control regimen /GERD /AR  Pt education on vicodin .   Plan  Patient Instructions  Follow med calendar closely and bring to each visit.  Continue on cough control regimen . Use caution with pain meds as these meds can make you sleepy and off balance. As cough gets better continue to wean off hydrocodone .  Follow up Dr. Melvyn Novas  In 2-3 months and As needed

## 2016-06-01 NOTE — Progress Notes (Signed)
Subjective:   Patient ID: [name], female    DOB: 1941-07-06  MRN: [id number]   Brief patient profile:  75 yowf quit smoking 1980 allergies itching and sneezing took shots much improved when stopped shots and stayed  much better except some problem spring and fall and did ok until 40's then developed recurrent cough typically assoc with URI's and not with spring and fall  But much worse since  Early 2014 despite rx for  Asthma and gerd so referred 05/30/2013 to pulmonary clinic by Dr Lawernce Pitts    History of Present Illness  05/30/2013 1st [facility] office visit/ Wert cc daily cough x months which is so severe gets gagged and occ vomit and pos urinary incontinent.  Clear mucus esp p shower, no better with inhalers and acid suppression to date  rec  First take delsym two tsp every 12 hours and supplement if needed with  tramadol 50 mg up to 2 every 4 hours to suppress the urge to cough at all or even clear your throat. . Once you have eliminated the cough for 3 straight days try reducing the tramadol first,  then the delsym as tolerated.   Try prilosec '20mg'$   Take x 2  30-60 min before first meal of the day and Pepcid 20 mg one bedtime  Prednisone 10 mg take  4 each am x 2 days,   2 each am x 2 days,  1 each am x 2 days and stop   GERD diet    06/27/2013 f/u ov/Wert re: 0 % better chronic cough x Jan 2014 / using saba for cough Chief Complaint  Patient presents with  . Follow-up    Pt states her SOB and wheezing are unchanged. Her cough is slightly improved since last visit. No new co's.    rec  schedule sinus ct> mild chronic changes only  Only use your albuterol/proaire as a rescue medication  First take delsym two tsp every 12 hours and supplement if needed with  Tylenol #3 one every 4 hours to suppress the urge to cough at all or even clear your throat.   Once you have eliminated the cough for 3 straight days try reducing the tylenol #3 ,  then the delsym as tolerated.   Keep flutter valve handy and use it when you start cough  Omeprazole 40 mg  Take x 2  30-60 min before first meal of the day and Pepcid 20 mg one bedtime and chlotrimeton 4 mg one at bedtime GERD diet   Please schedule a follow up office visit in 4 weeks, sooner if needed bring all medications with you when return.    07/25/2013 f/u ov/Wert re: cough x > one year / did not bring all meds "they are the same" Chief Complaint  Patient presents with  . Follow-up    Pt states her SOB and wheezing have improved some but her cough is some worse. Cough is prod with very minimal clear sputum.     >>prednisone taper , PPI /GERD, cough control rx   08/08/2013 Follow up and Med review  Patient returns for a followup and medication review. We reviewed all her medications and organized them into a medication calendar with patient education. Last visit. Patient was seen for persistent cough. She was placed on aggressive reflux, and rhinitis prevention regimen. Along with cough control, which appears patient reports that she is improved. Her shortness, of breath and wheezing have resolved, and her dry cough  has improved substantially.[place] and broke wrist last week requiring surgery.  rec No change rx    09/05/2013 f/u ov/Wert re: chronic cough  Chief Complaint  Patient presents with  . Follow-up    Pt states that her breathing is doing well. Her cough is unchanged since her last visit.   Worse cough is sometimes p shower but otherwise not really coughing much vs baseline  and no excess or bloody/purulent mucus.  Not limited by breathing from desired activities   rec 1st gen H1   10/13/2013 f/u ov/Wert re: cough x one year, worse p shower then early evening, h1 not helping  Chief Complaint  Patient presents with  . Follow-up    Review CT from earlier this week.  C/o sinus congestion, PND, nonprod cough X3 weeks.      questionaire (unchanged pattern from previous ov)   Kouffman Reflux v Neurogenic  Cough Differentiator Reflux Comments  Do you awaken from a sound sleep coughing violently?                            With trouble breathing?  not now   Do you have choking episodes when you cannot  Get enough air, gasping for air ?              Much better   Do you usually cough when you lie down into  The bed, or when you just lie down to rest ?                          Recliner x 38m Due to back   Do you usually cough after meals or eating?         sometime   Do you cough when (or after) you bend over?    sometime   GERD SCORE     Kouffman Reflux v Neurogenic Cough Differentiator Neurogenic   Do you more-or-less cough all day long? Not now   Does change of temperature make you cough? yes   Does laughing or chuckling cause you to cough? yes   Do fumes (perfume, automobile fumes, burned  Toast, etc.,) cause you to cough ?      no   Does speaking, singing, or talking on the phone cause you to cough   ?               yes   Neurogenic/Airway score    rec Depomedrol 120 mg IM  singulair 10 mg one daily in evening    11/10/2013 f/u ov/Wert re: chronic cough / MPN's Chief Complaint  Patient presents with  . Follow-up    Pt states that her cough is unchanged since her last visit. No new co's today.   no noct cough at all, p stirring x 5 min then cough > dry but this is really minimal compared to previous Never produces any mucus rec See calendar for specific medication instructions    We will call you for follow up CT in first week in July.   CT chest   01/09/14 > Dominant sub solid nodule in the superior segment of the right lower lobe measures 2.2 x 1.5 cm on image 27 >  Refer to T surgery >  excisional bx 02/12/14 >pos Ca > Rllobectomy c/w Stage I Adenoca      05/12/2016 acute extended ov/Wert re: cough x 2 years and much worse x one  month / no med calendar/ ppi pc  Chief Complaint  Patient presents with  . Acute Visit    Increased cough and wheezing x 1 month. Cough is rarely  prod but sputum has been clear.    cough quiets down overnight p hs h1  then only after stirs again in am Worse from heat to cold/ talking also brings it on  No better with albuterol / singulair  >>depo medrol x 1   06/01/2016 Follow up : Chronic cough and med review /Lung cancer stage 1 s/p R lobectomy adeno 2015  Pt returns for 3 week follow up and med review . Was seen last ov for chronic cough for 2 years. She was treated with Depo medrol shot, PPI/pepcid , cough control with delsym and vicodin. She is feeling much better. Feels she is 70% better.  Cough is less harsh and frequent. No fever, discolored mucus or chest pain. Energy level is picking back up. Allergy profile was done that showed mild allergies and IgE at 47.  CXR showed chronic changes.   Hx of lung cancer stage 1 s/p right lobectomy 2015. Serial CT chest 02/2016 showed stable bilateral pulmonary nodules and ggo .    Current Medications, Allergies, Complete Past Medical History, Past Surgical History, Family History, and Social History were reviewed in [employer] record.  ROS  The following are not active complaints unless bolded sore throat, dysphagia, dental problems, itching, sneezing,  nasal congestion or excess/ purulent secretions, ear ache,   fever, chills, sweats, unintended wt loss, pleuritic or exertional cp, hemoptysis,  orthopnea pnd or leg swelling, presyncope, palpitations, heartburn, abdominal pain, anorexia, nausea, vomiting, diarrhea  or change in bowel or urinary habits, change in stools or urine, dysuria,hematuria,  rash, arthralgias, visual complaints, headache, numbness weakness or ataxia or problems with walking or coordination,  change in mood/affect or memory.                  Objective:   Physical Exam Vitals:   06/01/16 0910  BP: 140/80  Pulse: 76  Temp: 98 F (36.7 C)  TempSrc: Oral  SpO2: 96%  Weight: 212 lb 3.2 oz (96.3 kg)  Height: '5\' 4"'$  (1.626 m)    amb  obese hoarse wf nad  Vital signs reviewed   07/25/2013       197>>196 08/08/2013 > 09/05/2013 198 > 10/13/2013  205 > 11/10/2013  208 > 05/12/2016  214       HEENT: nl dentition, turbinates, and orophanx. Nl external ear canals without cough reflex   NECK :  without JVD/Nodes/TM/ nl carotid upstrokes bilaterally   LUNGS: no acc muscle use, clear to A and P bilaterally    CV:  RRR  no s3 or murmur or increase in P2, no edema   ABD:  soft and nontender with nl excursion in the supine position. No bruits or organomegaly, bowel sounds nl  MS:  warm without deformities, calf tenderness, cyanosis or clubbing     CXR PA and Lateral:   05/12/2016 :      Low lung volumes. Stable right base pleural thickening consistent scarring. No acute abnormality.   Labs ordered 05/12/2016  Allergy profile mild allergy to ragweed, oak and birch , IgE 47 cbc with diff -not done   Alveta Quintela NP-C  [facility]  06/01/2016

## 2016-06-23 ENCOUNTER — Encounter (INDEPENDENT_AMBULATORY_CARE_PROVIDER_SITE_OTHER): Payer: Medicare Other | Admitting: Ophthalmology

## 2016-06-23 DIAGNOSIS — H35033 Hypertensive retinopathy, bilateral: Secondary | ICD-10-CM

## 2016-06-23 DIAGNOSIS — I10 Essential (primary) hypertension: Secondary | ICD-10-CM

## 2016-06-23 DIAGNOSIS — H43813 Vitreous degeneration, bilateral: Secondary | ICD-10-CM

## 2016-06-23 DIAGNOSIS — H34831 Tributary (branch) retinal vein occlusion, right eye, with macular edema: Secondary | ICD-10-CM | POA: Diagnosis not present

## 2016-06-23 DIAGNOSIS — H353112 Nonexudative age-related macular degeneration, right eye, intermediate dry stage: Secondary | ICD-10-CM | POA: Diagnosis not present

## 2016-06-24 ENCOUNTER — Other Ambulatory Visit: Payer: Self-pay | Admitting: Family Medicine

## 2016-06-24 DIAGNOSIS — Z1231 Encounter for screening mammogram for malignant neoplasm of breast: Secondary | ICD-10-CM

## 2016-07-21 ENCOUNTER — Encounter (INDEPENDENT_AMBULATORY_CARE_PROVIDER_SITE_OTHER): Payer: Medicare Other | Admitting: Ophthalmology

## 2016-07-21 DIAGNOSIS — H43813 Vitreous degeneration, bilateral: Secondary | ICD-10-CM | POA: Diagnosis not present

## 2016-07-21 DIAGNOSIS — H353132 Nonexudative age-related macular degeneration, bilateral, intermediate dry stage: Secondary | ICD-10-CM

## 2016-07-21 DIAGNOSIS — H35033 Hypertensive retinopathy, bilateral: Secondary | ICD-10-CM | POA: Diagnosis not present

## 2016-07-21 DIAGNOSIS — H34831 Tributary (branch) retinal vein occlusion, right eye, with macular edema: Secondary | ICD-10-CM

## 2016-07-21 DIAGNOSIS — I10 Essential (primary) hypertension: Secondary | ICD-10-CM

## 2016-07-29 ENCOUNTER — Ambulatory Visit
Admission: RE | Admit: 2016-07-29 | Discharge: 2016-07-29 | Disposition: A | Discharge status: Home / Self Care | Payer: Medicare Other | Source: Ambulatory Visit | Attending: Family Medicine | Admitting: Family Medicine

## 2016-07-29 DIAGNOSIS — Z1231 Encounter for screening mammogram for malignant neoplasm of breast: Secondary | ICD-10-CM

## 2016-09-01 ENCOUNTER — Encounter (INDEPENDENT_AMBULATORY_CARE_PROVIDER_SITE_OTHER): Payer: Medicare Other | Admitting: Ophthalmology

## 2016-09-01 DIAGNOSIS — I10 Essential (primary) hypertension: Secondary | ICD-10-CM | POA: Diagnosis not present

## 2016-09-01 DIAGNOSIS — H35033 Hypertensive retinopathy, bilateral: Secondary | ICD-10-CM | POA: Diagnosis not present

## 2016-09-01 DIAGNOSIS — H353121 Nonexudative age-related macular degeneration, left eye, early dry stage: Secondary | ICD-10-CM

## 2016-09-01 DIAGNOSIS — H353112 Nonexudative age-related macular degeneration, right eye, intermediate dry stage: Secondary | ICD-10-CM | POA: Diagnosis not present

## 2016-09-01 DIAGNOSIS — H43813 Vitreous degeneration, bilateral: Secondary | ICD-10-CM | POA: Diagnosis not present

## 2016-09-01 DIAGNOSIS — H34831 Tributary (branch) retinal vein occlusion, right eye, with macular edema: Secondary | ICD-10-CM | POA: Diagnosis not present

## 2016-10-26 ENCOUNTER — Encounter (INDEPENDENT_AMBULATORY_CARE_PROVIDER_SITE_OTHER): Payer: Medicare Other | Admitting: Ophthalmology

## 2016-10-26 DIAGNOSIS — I10 Essential (primary) hypertension: Secondary | ICD-10-CM

## 2016-10-26 DIAGNOSIS — H43813 Vitreous degeneration, bilateral: Secondary | ICD-10-CM | POA: Diagnosis not present

## 2016-10-26 DIAGNOSIS — H353132 Nonexudative age-related macular degeneration, bilateral, intermediate dry stage: Secondary | ICD-10-CM | POA: Diagnosis not present

## 2016-10-26 DIAGNOSIS — H35033 Hypertensive retinopathy, bilateral: Secondary | ICD-10-CM

## 2016-10-26 DIAGNOSIS — H34831 Tributary (branch) retinal vein occlusion, right eye, with macular edema: Secondary | ICD-10-CM

## 2016-12-10 ENCOUNTER — Encounter (INDEPENDENT_AMBULATORY_CARE_PROVIDER_SITE_OTHER): Payer: Medicare Other | Admitting: Ophthalmology

## 2016-12-17 ENCOUNTER — Encounter (INDEPENDENT_AMBULATORY_CARE_PROVIDER_SITE_OTHER): Payer: Medicare Other | Admitting: Ophthalmology

## 2016-12-17 DIAGNOSIS — H43813 Vitreous degeneration, bilateral: Secondary | ICD-10-CM | POA: Diagnosis not present

## 2016-12-17 DIAGNOSIS — I10 Essential (primary) hypertension: Secondary | ICD-10-CM | POA: Diagnosis not present

## 2016-12-17 DIAGNOSIS — H353132 Nonexudative age-related macular degeneration, bilateral, intermediate dry stage: Secondary | ICD-10-CM | POA: Diagnosis not present

## 2016-12-17 DIAGNOSIS — H34831 Tributary (branch) retinal vein occlusion, right eye, with macular edema: Secondary | ICD-10-CM

## 2016-12-17 DIAGNOSIS — H35033 Hypertensive retinopathy, bilateral: Secondary | ICD-10-CM | POA: Diagnosis not present

## 2017-01-14 ENCOUNTER — Other Ambulatory Visit: Payer: Self-pay | Admitting: Thoracic Surgery (Cardiothoracic Vascular Surgery)

## 2017-01-14 DIAGNOSIS — C3490 Malignant neoplasm of unspecified part of unspecified bronchus or lung: Secondary | ICD-10-CM

## 2017-01-19 ENCOUNTER — Other Ambulatory Visit: Payer: Self-pay | Admitting: Physician Assistant

## 2017-01-28 ENCOUNTER — Encounter (INDEPENDENT_AMBULATORY_CARE_PROVIDER_SITE_OTHER): Payer: Medicare Other | Admitting: Ophthalmology

## 2017-01-28 DIAGNOSIS — H34831 Tributary (branch) retinal vein occlusion, right eye, with macular edema: Secondary | ICD-10-CM

## 2017-01-28 DIAGNOSIS — H43813 Vitreous degeneration, bilateral: Secondary | ICD-10-CM | POA: Diagnosis not present

## 2017-01-28 DIAGNOSIS — H35033 Hypertensive retinopathy, bilateral: Secondary | ICD-10-CM

## 2017-01-28 DIAGNOSIS — I10 Essential (primary) hypertension: Secondary | ICD-10-CM | POA: Diagnosis not present

## 2017-01-28 DIAGNOSIS — H353132 Nonexudative age-related macular degeneration, bilateral, intermediate dry stage: Secondary | ICD-10-CM | POA: Diagnosis not present

## 2017-02-09 ENCOUNTER — Ambulatory Visit
Admission: RE | Admit: 2017-02-09 | Discharge: 2017-02-09 | Disposition: A | Discharge status: Home / Self Care | Payer: Medicare Other | Source: Ambulatory Visit | Attending: Thoracic Surgery (Cardiothoracic Vascular Surgery) | Admitting: Thoracic Surgery (Cardiothoracic Vascular Surgery)

## 2017-02-09 ENCOUNTER — Ambulatory Visit (INDEPENDENT_AMBULATORY_CARE_PROVIDER_SITE_OTHER): Payer: Medicare Other | Admitting: Thoracic Surgery (Cardiothoracic Vascular Surgery)

## 2017-02-09 ENCOUNTER — Encounter: Payer: Self-pay | Admitting: Thoracic Surgery (Cardiothoracic Vascular Surgery)

## 2017-02-09 VITALS — BP 169/96 | HR 83 | Resp 16 | Ht 64.0 in | Wt 202.0 lb

## 2017-02-09 DIAGNOSIS — C3490 Malignant neoplasm of unspecified part of unspecified bronchus or lung: Secondary | ICD-10-CM

## 2017-02-09 DIAGNOSIS — C3491 Malignant neoplasm of unspecified part of right bronchus or lung: Secondary | ICD-10-CM | POA: Diagnosis not present

## 2017-02-09 DIAGNOSIS — Z902 Acquired absence of lung [part of]: Secondary | ICD-10-CM | POA: Diagnosis not present

## 2017-02-09 NOTE — Progress Notes (Signed)
[address]       [place] [address]             [phone]    HPI: Mrs. 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 I last saw her a year ago for her 2 year follow-up visit. She had multiple stable solid and groundglass pulmonary lesions. All the solid lesions are very small..  She has been having trouble with her breathing this summer. She says the heat and humidity have been causing a lot of wheezing and coughing. She is taking her Singulair and using her albuterol on a fairly regular basis. She has not had any unusual headaches or visual changes. She does not have any new bone or joint pain.  She quit smoking in 1980.  Past Medical History:  Diagnosis Date  . Adenocarcinoma of lung, stage 1 ([place])    Status post right lower lobectomy August 2015  . Anxiety   . Asthma   . Atrophic vaginitis   . Chronic iritis of left eye    Pupil stays dilated; nonreactive  . Colon polyp   . Contact lens/glasses fitting    wears contacts or glasses  . Depression   . Depression with anxiety   . Environmental allergies   . GERD (gastroesophageal reflux disease)   . Hypertriglyceridemia   . Kidney stones   . OA (osteoarthritis)   . Osteopenia   . PONV (postoperative nausea and vomiting)       Current Outpatient Prescriptions  Medication Sig Dispense Refill  . albuterol (PROAIR HFA) 108 (90 BASE) MCG/ACT inhaler Inhale 2 puffs into the lungs every 6 (six) hours as needed for wheezing or shortness of breath. 1 Inhaler 4  . Ascorbic Acid (VITAMIN C) 100 MG tablet Take 100 mg by mouth daily.    Marland Kitchen Besifloxacin HCl (BESIVANCE) 0.6 % SUSP Apply to eye. Apply to eye once monthly    . Calcium-Magnesium-Vitamin D (CALCIUM 1200+D3 PO) Take 1 tablet by mouth at bedtime.    . chlorpheniramine (CHLOR-TRIMETON) 4 MG tablet Take 4 mg by mouth every 4 (four)  hours as needed.     . cyclobenzaprine (FLEXERIL) 10 MG tablet Take 10 mg by mouth 3 (three) times daily as needed.   0  . dextromethorphan (DELSYM) 30 MG/5ML liquid Take 60 mg by mouth 2 (two) times daily as needed for cough.    . famotidine (PEPCID) 20 MG tablet Take 20 mg by mouth at bedtime.     . fenofibrate 160 MG tablet Take 160 mg by mouth daily.    Marland Kitchen FLUoxetine (PROZAC) 20 MG capsule Take 40 mg by mouth daily.     Marland Kitchen HYDROcodone-acetaminophen (NORCO/VICODIN) 5-325 MG tablet Take 1-2 tablets by mouth every 6 (six) hours as needed. 40 tablet 0  . losartan (COZAAR) 50 MG tablet Take 50 mg by mouth every morning.  12  . montelukast (SINGULAIR) 10 MG tablet Take 1 tablet (10 mg total) by mouth at bedtime. 90 tablet 0  . Multiple Vitamins-Minerals (CENTRUM SILVER PO) Take 1 tablet by mouth daily.    Marland Kitchen omeprazole (PRILOSEC) 40 MG capsule Take 40 mg by mouth daily.    Marland Kitchen Respiratory Therapy Supplies (FLUTTER) DEVI Use as directed 1 each 0  . solifenacin (VESICARE) 10 MG tablet Take 10 mg by mouth daily.    . traZODone (  DESYREL) 50 MG tablet Take 50 mg by mouth at bedtime.     No current facility-administered medications for this visit.     Physical Exam BP (!) 169/96 (BP Location: Left Arm, Patient Position: Sitting, Cuff Size: Small) Comment (Cuff Size): LOWER ARM  Pulse 83   Resp 16   Ht 5\' 4"  (1.626 m)   Wt 202 lb (91.6 kg)   SpO2 92% Comment: ON RA  BMI 34.67 kg/m  Obese 76 year old woman in no acute distress Alert and oriented 3 with no focal deficits No cervical or supraclavicular adenopathy Cardiac regular rate and rhythm normal S1 and S2 Lungs clear with deep breath sounds bilaterally no rales or wheezing  Diagnostic Tests: CT CHEST WITHOUT CONTRAST  TECHNIQUE: Multidetector CT imaging of the chest was performed following the standard protocol without IV contrast.  COMPARISON:  02/25/2016 chest CT.  05/12/2016 chest radiograph.  FINDINGS: Cardiovascular: Normal  heart size. No significant pericardial fluid/thickening. Left anterior descending and right coronary atherosclerosis. Atherosclerotic nonaneurysmal thoracic aorta. Normal caliber pulmonary arteries.  Mediastinum/Nodes: Stable heterogeneous hypodense 1.3 cm left thyroid lobe nodule. Mildly patulous thoracic esophagus with fluid levels. No pathologically enlarged axillary, mediastinal or gross hilar lymph nodes, noting limited sensitivity for the detection of hilar adenopathy on this noncontrast study.  Lungs/Pleura: No pneumothorax. No pleural effusion. Status post right lower lobectomy. No acute consolidative airspace disease. There are numerous (greater than 10) scattered tiny solid pulmonary nodules scattered throughout both lungs, largest 4 mm in the apical right upper lobe (series 4/ image 24), not appreciably changed in the interval. There is a subsolid 1.4 cm left upper lobe pulmonary nodule with 0.3 cm solid component (series 4/ image 42), previously 1.4 cm with 0.3 cm solid component, stable. There are a few scattered pure ground-glass right upper lobe pulmonary nodules, largest 2.9 cm in the apical right upper lobe (series 4/image 20), not appreciably changed. No new significant pulmonary nodules.  Upper abdomen: Cholelithiasis.  Musculoskeletal: No aggressive appearing focal osseous lesions. Marked thoracic spondylosis. Stable chronic moderate T12 vertebral compression fracture. Focal 1.5 cm asymmetry in the right breast with simple fluid density (series 3/ image 29) is stable.  IMPRESSION: 1. No evidence of local tumor recurrence status post right lower lobectomy. No findings suspicious for metastatic disease. 2. Numerous chronic subcentimeter solid pulmonary nodules, right upper lobe ground-glass pulmonary nodules and left upper lobe subsolid pulmonary nodule are all stable and warrant continued annual chest CT surveillance. 3. Additional findings include 2  vessel coronary atherosclerosis and cholelithiasis.  Aortic Atherosclerosis (ICD10-I70.0).   Electronically Signed   By: Ilona Sorrel M.D.   On: 02/09/2017 11:42 I personally reviewed the CT images and concur with findings noted above. No interval change from her last CT.  Impression: Mrs. Mcconathy is a 76 year old woman who had a right lower lobectomy for a stage IA adenocarcinoma 3 years ago. She has no evidence recurrent disease. She does have multiple small solid and larger groundglass opacities in both lungs. These have been stable over the past 3 years. She does need continued annual follow-up.  COPD- I recommended that she check with Dr. Melvyn Novas regarding her wheezing. She might need a baseline inhaler to go along with her albuterol.  Plan: Return in one year with CT chest  Melrose Nakayama, MD [facility] [phone]

## 2017-02-22 ENCOUNTER — Encounter: Payer: Self-pay | Admitting: Internal Medicine

## 2017-02-22 ENCOUNTER — Ambulatory Visit (INDEPENDENT_AMBULATORY_CARE_PROVIDER_SITE_OTHER): Payer: Medicare Other | Admitting: Internal Medicine

## 2017-02-22 VITALS — BP 140/96 | HR 80 | Temp 98.2°F | Ht 63.0 in | Wt 205.0 lb

## 2017-02-22 DIAGNOSIS — Z902 Acquired absence of lung [part of]: Secondary | ICD-10-CM | POA: Diagnosis not present

## 2017-02-22 DIAGNOSIS — R05 Cough: Secondary | ICD-10-CM

## 2017-02-22 DIAGNOSIS — R059 Cough, unspecified: Secondary | ICD-10-CM

## 2017-02-22 DIAGNOSIS — I10 Essential (primary) hypertension: Secondary | ICD-10-CM

## 2017-02-22 MED ORDER — HYDROCODONE-ACETAMINOPHEN 5-325 MG PO TABS: 1.0000 | ORAL_TABLET | Freq: Four times a day (QID) | ORAL | 0 refills | Status: DC | PRN

## 2017-02-22 MED ORDER — FLUTTER DEVI: 0 refills | Status: DC

## 2017-02-22 MED ORDER — METHYLPREDNISOLONE ACETATE 80 MG/ML IJ SUSP
120.0000 mg | Freq: Once | INTRAMUSCULAR | Status: AC
  Administered 2017-02-22: 120 mg via INTRAMUSCULAR

## 2017-02-22 MED ORDER — IRBESARTAN 150 MG PO TABS: 150.0000 mg | ORAL_TABLET | Freq: Every day | ORAL | 11 refills | Status: DC

## 2017-02-22 NOTE — Progress Notes (Signed)
Subjective:   Patient ID: [name], female    DOB: 1940/10/05  MRN: [id number]   Brief patient profile:  40 yowf quit smoking 1980 allergies itching and sneezing took shots much improved when stopped shots and stayed  much better except some problem spring and fall and did ok until 40's then developed recurrent cough typically assoc with URI's and not with spring and fall  But much worse since  Early 2014 despite rx for  Asthma and gerd so referred 05/30/2013 to pulmonary clinic by Dr Lawernce Pitts    History of Present Illness  05/30/2013 1st [facility] office visit/ [name] cc daily cough x months which is so severe gets gagged and occ vomit and pos urinary incontinent.  Clear mucus esp p shower, no better with inhalers and acid suppression to date  rec  First take delsym two tsp every 12 hours and supplement if needed with  tramadol 50 mg up to 2 every 4 hours to suppress the urge to cough at all or even clear your throat. . Once you have eliminated the cough for 3 straight days try reducing the tramadol first,  then the delsym as tolerated.   Try prilosec 20mg   Take x 2  30-60 min before first meal of the day and Pepcid 20 mg one bedtime  Prednisone 10 mg take  4 each am x 2 days,   2 each am x 2 days,  1 each am x 2 days and stop   GERD diet    06/27/2013 f/u ov/[name] re: 0 % better chronic cough x Jan 2014 / using saba for cough Chief Complaint  Patient presents with  . Follow-up    Pt states her SOB and wheezing are unchanged. Her cough is slightly improved since last visit. No new co's.    rec  schedule sinus ct> mild chronic changes only  Only use your albuterol/proaire as a rescue medication  First take delsym two tsp every 12 hours and supplement if needed with  Tylenol #3 one every 4 hours to suppress the urge to cough at all or even clear your throat.   Once you have eliminated the cough for 3 straight days try reducing the tylenol #3 ,  then the delsym as tolerated.   Keep flutter valve handy and use it when you start cough  Omeprazole 40 mg  Take x 2  30-60 min before first meal of the day and Pepcid 20 mg one bedtime and chlotrimeton 4 mg one at bedtime GERD diet   Please schedule a follow up office visit in 4 weeks, sooner if needed bring all medications with you when return.    07/25/2013 f/u ov/[name] re: cough x > one year / did not bring all meds "they are the same" Chief Complaint  Patient presents with  . Follow-up    Pt states her SOB and wheezing have improved some but her cough is some worse. Cough is prod with very minimal clear sputum.     >>prednisone taper , PPI /GERD, cough control rx   08/08/2013 Follow up and Med review  Patient returns for a followup and medication review. We reviewed all her medications and organized them into a medication calendar with patient education. Last visit. Patient was seen for persistent cough. She was placed on aggressive reflux, and rhinitis prevention regimen. Along with cough control, which appears patient reports that she is improved. Her shortness, of breath and wheezing have resolved, and her dry cough  has improved substantially.[place] and broke wrist last week requiring surgery.  rec No change rx    09/05/2013 f/u ov/[name] re: chronic cough  Chief Complaint  Patient presents with  . Follow-up    Pt states that her breathing is doing well. Her cough is unchanged since her last visit.   Worse cough is sometimes p shower but otherwise not really coughing much vs baseline  and no excess or bloody/purulent mucus.  Not limited by breathing from desired activities   rec 1st gen H1   10/13/2013 f/u ov/[name] re: cough x one year, worse p shower then early evening, h1 not helping  Chief Complaint  Patient presents with  . Follow-up    Review CT from earlier this week.  C/o sinus congestion, PND, nonprod cough X3 weeks.      questionaire (unchanged pattern from previous ov)   Kouffman Reflux v Neurogenic  Cough Differentiator Reflux Comments  Do you awaken from a sound sleep coughing violently?                            With trouble breathing?  not now   Do you have choking episodes when you cannot  Get enough air, gasping for air ?              Much better   Do you usually cough when you lie down into  The bed, or when you just lie down to rest ?                          Recliner x 38m Due to back   Do you usually cough after meals or eating?         sometime   Do you cough when (or after) you bend over?    sometime   GERD SCORE     Kouffman Reflux v Neurogenic Cough Differentiator Neurogenic   Do you more-or-less cough all day long? Not now   Does change of temperature make you cough? yes   Does laughing or chuckling cause you to cough? yes   Do fumes (perfume, automobile fumes, burned  Toast, etc.,) cause you to cough ?      no   Does speaking, singing, or talking on the phone cause you to cough   ?               yes   Neurogenic/Airway score    rec Depomedrol 120 mg IM  singulair 10 mg one daily in evening    11/10/2013 f/u ov/[name] re: chronic cough / MPN's Chief Complaint  Patient presents with  . Follow-up    Pt states that her cough is unchanged since her last visit. No new co's today.   no noct cough at all, p stirring x 5 min then cough > dry but this is really minimal compared to previous Never produces any mucus rec See calendar for specific medication instructions    We will call you for follow up CT in first week in July.   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 Acute Visit    Increased cough and wheezing x 1 month. Cough is rarely  prod but sputum has been clear.   cough quiets down overnight p hs h1  then only after stirs again in am Worse from heat to cold/ talking also brings it on  No better with albuterol / singulair  >>depo medrol x 1     06/01/2016 NP  Follow up : Chronic cough and med review /Lung cancer stage 1 s/p R lobectomy adeno 2015  Pt returns for 3 week follow up and med review . Was seen last ov for chronic cough for 2 years. She was treated with Depo medrol shot, PPI/pepcid , cough control with delsym and vicodin. She is feeling much better. Feels she is 70% better.  Cough is less harsh and frequent. No fever, discolored mucus or chest pain. Energy level is picking back up. Allergy profile was done that showed mild allergies and IgE at 47.  CXR showed chronic changes.   Hx of lung cancer stage 1 s/p right lobectomy 2015. Serial CT chest 02/2016 showed stable bilateral pulmonary nodules and ggo .  rec Follow med calendar closely and bring to each visit.  Continue on cough control regimen . Use caution with pain meds as these meds can make you sleepy and off balance. As cough gets better continue to wean off hydrocodone .     02/22/2017 acute extended ov/[name] re: cough x 3 years/ non-adherent / no med calendar  Chief Complaint  Patient presents with  . Acute Visit    Increased cough x 6 wks- started on fosamax at that time. She has also been wheezing. Cough is mainly non prod, occ will produce very minimal clear sputum. She is using proair 6 x daily on average.   cough held about the same siince prev ov = 70% off hydrocodone then worse in spring and then again in 12/2016  p started fosfamax per  PCP rec  Cough is the worst noct assoc with sense of wheezing  On best days since lung  surgery doe = MMRC2 = can't walk a nl pace on a flat grade s sob but does fine slow and flat eg [employer]  Not sure proair helps but uses it anyway (with very poor hfa)   No obvious day to day or daytime  variability or assoc excess/ purulent sputum or mucus plugs or hemoptysis or cp or chest tightness, subjective wheeze or overt sinus or hb symptoms. No unusual exp hx or h/o childhood pna/ asthma or knowledge of premature birth.    Also denies any obvious fluctuation of symptoms with weather or environmental changes or other aggravating or alleviating factors except as outlined above   Current Medications, Allergies, Complete Past Medical History, Past Surgical History, Family History, and Social History were reviewed in [employer] record.  ROS  The following are not active complaints unless bolded sore throat, dysphagia, dental problems, itching, sneezing,  nasal congestion or excess/ purulent secretions, ear ache,   fever, chills, sweats, unintended wt loss, classically pleuritic or exertional cp,  orthopnea pnd or leg swelling, presyncope, palpitations, abdominal pain, anorexia, nausea, vomiting, diarrhea  or change in bowel or bladder habits, change in stools or urine, dysuria,hematuria,  rash, arthralgias, visual complaints, headache, numbness, weakness or ataxia or problems with walking or coordination,  change in mood/affect or memory.  Objective:   Physical Exam   amb obese hoarse wf nad with harsh barking upper airway cough   Vital signs reviewed   - Note on arrival 02 sats  95% on RA and bp 140/96 on Losartan     07/25/2013       197>>196 08/08/2013 > 09/05/2013 198 > 10/13/2013  205 > 11/10/2013  208 > 05/12/2016  214   > 02/23/2017 205     HEENT: nl dentition, turbinates, and orophanx. Nl external ear canals without cough reflex   NECK :  without JVD/Nodes/TM/ nl carotid upstrokes bilaterally   LUNGS: no acc muscle use, clear to A and P bilaterally    CV:  RRR  no s3 or murmur or increase in P2, no edema   ABD:  soft and nontender with nl excursion in the supine position. No bruits or organomegaly, bowel sounds nl  MS:  warm  without deformities, calf tenderness, cyanosis or clubbing      I personally reviewed images and agree with radiology impression as follows:   Chest CT w/o contrast 02/09/17 1. No evidence of local tumor recurrence status post right lower lobectomy. No findings suspicious for metastatic disease. 2. Numerous chronic subcentimeter solid pulmonary nodules, right upper lobe ground-glass pulmonary nodules and left upper lobe subsolid pulmonary nodule are all stable and warrant continued annual chest CT surveillance    Labs ordered 05/12/2016  Allergy profile mild allergy to ragweed, oak and birch , IgE 47

## 2017-02-22 NOTE — Patient Instructions (Addendum)
Depomedrol 120 mg IM   Hold fosfamax for now   Stop losartan and start avapro (ibesartan) 150 mg daily - take only half a tablet daily if light headed standing  Use the flutter whenever you start coughing to prevent trauma to your upper airway    GERD (REFLUX)  is an extremely common cause of respiratory symptoms just like yours , many times with no obvious heartburn at all.    It can be treated with medication, but also with lifestyle changes including elevation of the head of your bed (ideally with 6 inch  bed blocks),  Smoking cessation, avoidance of late meals, excessive alcohol, and avoid fatty foods, chocolate, peppermint, colas, red wine, and acidic juices such as orange juice.  NO MINT OR MENTHOL PRODUCTS SO NO COUGH DROPS   USE SUGARLESS CANDY INSTEAD (Jolley ranchers or Stover's or Life Savers) or even ice chips will also do - the key is to swallow to prevent all throat clearing. NO OIL BASED VITAMINS - use powdered substitutes.    See Tammy NP w/in 2 weeks (or first available thereafter) with all your medications, even over the counter meds, separated in two separate bags, the ones you take no matter what vs the ones you stop once you feel better and take only as needed when you feel you need them.   Tammy  will generate for you a new user friendly medication calendar that will put [place] all on the same page re: your medication use.     Without this process, it simply isn't possible to assure that we are providing  your outpatient care  with  the attention to detail we feel you deserve.   If we cannot assure that you're getting that kind of care,  then we cannot manage your problem effectively from this clinic.  Once you have seen Tammy and we are sure that we're all on the same page with your medication use she will arrange follow up with me with full pfts on return

## 2017-02-23 DIAGNOSIS — I10 Essential (primary) hypertension: Secondary | ICD-10-CM

## 2017-02-23 HISTORY — DX: Essential (primary) hypertension: I10

## 2017-02-23 NOTE — Assessment & Plan Note (Signed)
-   Spirometry 02/23/13 wnl  - 07/03/2013 Sinus CT > Mild chronic sinus disease as described. No acute findings. Left-to-right nasal septal deviation of 3 mm. -med calendar 08/08/13 , redone 06/01/2016 and 02/22/2017  - Eos 4%   10/13/2013  > rec singulair daily  - FENO 05/12/2016  =   24 on singulair  - Allergy profile 05/12/2016 >   IgE  47 pos RAST ragweed /oak/ birch  - 02/22/2017 added flutter valve and d/c'd fosfamax    Lack of cough resolution on a verified empirical regimen(which I have been unable to date to achieve)  could mean an alternative diagnosis (irritable larynx syndrome) , persistence of the disease state (eg sinusitis or bronchiectasis, both of which already ruled out ) , or inadequacy of currently available therapy (eg no medical rx available for non-acid gerd)   Of the three most common causes of  Sub-acute or recurrent or chronic cough, only one (GERD)  can actually contribute to/ trigger  the other two (asthma and post nasal drip syndrome)  and perpetuate the cylce of cough.  While not intuitively obvious, many patients with chronic low grade reflux do not cough until there is a primary insult that disturbs the protective epithelial barrier and exposes sensitive nerve endings.   This is typically viral but can be direct physical injury such as with an endotracheal tube.   The point is that once this occurs, it is difficult to eliminate the cycle  using anything but a maximally effective acid suppression regimen at least in the short run, accompanied by an appropriate diet to address non acid GERD and control / eliminate the cough itself for at least 3 days.   rec max rx for gerd/ diet /hydrocone acutely and add flutter to prevent airway collapse on cough and f/uin 2 weeks with all meds in hand using a trust but verify approach to confirm accurate Medication  Reconciliation The principal here is that until we are certain that the  patients are doing what we've asked, it makes no sense to  ask them to do more.    I had an extended discussion with the patient reviewing all relevant studies completed to date and  lasting 25 minutes of a 40  minute office  visit addressing     re  severe non-specific but potentially very serious refractory respiratory symptoms of uncertain and potentially multiple  etiologies.     Each maintenance medication was reviewed in detail including most importantly the difference between maintenance and as needed and under what circumstances the prns are to be used. This was done in the context of a medication calendar review which provided the patient with a user-friendly unambiguous mechanism for medication administration and reconciliation and provides an action plan for all active problems. It is critical that this be shown to every doctor  for modification during the office visit if necessary so the patient can use it as a working document.

## 2017-02-23 NOTE — Assessment & Plan Note (Signed)
For reasons that may related to vascular permability and nitric oxide pathways but not elevated  bradykinin levels (as seen with  ACEi use) losartan in the generic form has been reported now from mulitple sources  to cause a similar pattern of non-specific  upper airway symptoms as seen with acei.   This has not been reported with exposure to the other ARB's to date, so it seems reasonable for now to try either generic diovan or avapro if ARB needed or use an alternative class altogether.  See:  Lelon Frohlich Allergy Asthma Immunol  2008: 101: p 495-499    Try avapro 150 mg daily as bp not well controlled on losartan anyway and may be contributing to the cough

## 2017-02-23 NOTE — Assessment & Plan Note (Signed)
Needs new set of pfts and walking sats on return once we have a better handle on the cough but assured on CT that these present symptoms have little to do with dx of lung ca or s/p lobectomy residual symptoms (but need pfts to be sure

## 2017-02-25 ENCOUNTER — Encounter (HOSPITAL_BASED_OUTPATIENT_CLINIC_OR_DEPARTMENT_OTHER): Payer: Self-pay | Admitting: *Deleted

## 2017-02-25 ENCOUNTER — Emergency Department (HOSPITAL_BASED_OUTPATIENT_CLINIC_OR_DEPARTMENT_OTHER)
Admission: EM | Admit: 2017-02-25 | Discharge: 2017-02-25 | Disposition: A | Discharge status: Home / Self Care | Payer: Medicare Other | Attending: Emergency Medicine | Admitting: Emergency Medicine

## 2017-02-25 ENCOUNTER — Telehealth: Payer: Self-pay | Admitting: Internal Medicine

## 2017-02-25 DIAGNOSIS — Z79899 Other long term (current) drug therapy: Secondary | ICD-10-CM | POA: Diagnosis not present

## 2017-02-25 DIAGNOSIS — J45909 Unspecified asthma, uncomplicated: Secondary | ICD-10-CM | POA: Insufficient documentation

## 2017-02-25 DIAGNOSIS — I10 Essential (primary) hypertension: Secondary | ICD-10-CM

## 2017-02-25 DIAGNOSIS — Z87891 Personal history of nicotine dependence: Secondary | ICD-10-CM | POA: Insufficient documentation

## 2017-02-25 DIAGNOSIS — Z85118 Personal history of other malignant neoplasm of bronchus and lung: Secondary | ICD-10-CM | POA: Diagnosis not present

## 2017-02-25 MED ORDER — CHLORTHALIDONE 25 MG PO TABS: 25.0000 mg | ORAL_TABLET | Freq: Every day | ORAL | 0 refills | Status: DC

## 2017-02-25 NOTE — Telephone Encounter (Signed)
ATC to call patient x2 but it went straight to VM. Attempted to leave a VM but a strange recording came on stating I needed to provide a code. Will try to call back later.

## 2017-02-25 NOTE — ED Triage Notes (Signed)
Her BP is elevated. She was seen by her MD 3 days ago when her BP medication was changed from Losartan to Irbesartan.

## 2017-02-25 NOTE — Telephone Encounter (Signed)
Pt seen in the office on 8.20.18 by MW for acute visit for her cough >> depo and Rx for Norco given and Avapro 150mg  added for uncontrolled HTN (BP was 140/96 at time of visit)  Pt returned call Spoke with patient who reported that she checked her BP yesterday to follow up after beginning the new Rx and BP was 170s/100s.  She began checking her BP sporadically since then and BP has been staying between 160s-180s/100s - most recent BP this afternoon was 185/108.  Pt denies any headache, blurred vision, slurred speech, gait issues or stroke symptoms at this time.  Her home BP monitor was checked and has new batteries.  Advised pt that due to her extremely high BP and the late hour to immediately go to the ER for evaluation.  Pt asked if the [facility] will suffice since she lives in [place] - yes.  Pt stated she will go there now.  Pt stated she is to go out of town tomorrow "just overnight" and asked if this would still be okay.  Informed patient that she must discuss this with the ER physician to ensure that it will be safe for her to do so.  Pt voiced her understanding.  Did ask patient if there is anyone that can accompany her to there ER >> there is not.   Pt was audibly agitated/worried on the phone, asked pt to try to calm down as this will only stress her further  Will sign and route to MW as Juluis Rainier

## 2017-02-25 NOTE — Discharge Instructions (Signed)
Continue your referral.  New prescription for daily a.m. chlorthalidone. Check and record your blood pressure midmorning once per day for your next doctor visit.

## 2017-02-25 NOTE — ED Provider Notes (Signed)
[facility] Provider Note   CSN: [id number] Arrival date & time: 02/25/17  1725     History   Chief Complaint Chief Complaint  Patient presents with  . Hypertension    HPI [name] is a 76 y.o. female. Chief complaint is high blood pressure  HPI:  76 y/o female. History of high blood pressure. Previously on losartan. Saw pulmonary this week for a frequent cough. Was changed from her ARB to Avapro. She's taking this for 3 days. Blood pressures have been 170-180/90-100. She is asymptomatic. No stroke symptoms. No headache. No vision changes other than her chronic changes from macular degeneration. No chest pain or shortness of breath. No peripheral edema.  Past Medical History:  Diagnosis Date  . Adenocarcinoma of lung, stage 1 ([place])    Status post right lower lobectomy August 2015  . Anxiety   . Asthma   . Atrophic vaginitis   . Chronic iritis of left eye    Pupil stays dilated; nonreactive  . Colon polyp   . Contact lens/glasses fitting    wears contacts or glasses  . Depression   . Depression with anxiety   . Environmental allergies   . GERD (gastroesophageal reflux disease)   . Hypertriglyceridemia   . Kidney stones   . OA (osteoarthritis)   . Osteopenia   . PONV (postoperative nausea and vomiting)     Patient Active Problem List   Diagnosis Date Noted  . Essential hypertension 02/23/2017  . Adenocarcinoma of lung, stage 1 ([place]) 05/08/2014  . S/P lobectomy of lung 02/12/2014  . Asthma 01/30/2014  .  Multiple pulmonary nodules, largest R mid lung 06/29/2013  . Cough 05/31/2013    Past Surgical History:  Procedure Laterality Date  . APPENDECTOMY    . COLONOSCOPY    . EYE SURGERY Left    cataract removal  . ORIF WRIST FRACTURE Left 08/04/2013   Procedure: OPEN REDUCTION INTERNAL FIXATION (ORIF) LEFT DISTAL RADIUS WRIST FRACTURE;  Surgeon: Wynonia Sours, MD;  Location: [facility];  Service: Orthopedics;  Laterality: Left;    . PARTIAL HYSTERECTOMY    . VIDEO ASSISTED THORACOSCOPY (VATS)/WEDGE RESECTION Right 02/12/2014   Procedure: RIGHT VIDEO ASSISTED THORACOSCOPY RIGHT LOWER LOBE LUNG /WEDGE RESECTION,RIGHT THORACOTOMY WITH RIGHT LOWER LOBE LOBECTOMY & NODE DISSECTION;  Surgeon: Melrose Nakayama, MD;  Location: [facility];  Service: Thoracic;  Laterality: Right;  Marland Kitchen VIDEO BRONCHOSCOPY N/A 02/12/2014   Procedure: VIDEO BRONCHOSCOPY;  Surgeon: Melrose Nakayama, MD;  Location: [facility];  Service: Thoracic;  Laterality: N/A;    OB History    No data available       Home Medications    Prior to Admission medications   Medication Sig Start Date End Date Taking? Authorizing Provider  albuterol (PROAIR HFA) 108 (90 BASE) MCG/ACT inhaler Inhale 2 puffs into the lungs every 6 (six) hours as needed for wheezing or shortness of breath. 01/03/14   Tanda Rockers, MD  Ascorbic Acid (VITAMIN C) 100 MG tablet Take 100 mg by mouth daily.    [provider]  Besifloxacin HCl (BESIVANCE) 0.6 % SUSP Apply to eye. Apply to eye once monthly    [provider]  Calcium-Magnesium-Vitamin D (CALCIUM 1200+D3 PO) Take 1 tablet by mouth at bedtime.    [provider]  chlorpheniramine (CHLOR-TRIMETON) 4 MG tablet Take 4 mg by mouth every 4 (four) hours as needed.     [provider]  chlorthalidone (HYGROTON) 25 MG  tablet Take 1 tablet (25 mg total) by mouth daily. 02/25/17   Tanna Furry, MD  cyclobenzaprine (FLEXERIL) 10 MG tablet Take 10 mg by mouth 3 (three) times daily as needed.  07/11/14   [provider]  dextromethorphan (DELSYM) 30 MG/5ML liquid Take 60 mg by mouth 2 (two) times daily as needed for cough.    [provider]  famotidine (PEPCID) 20 MG tablet Take 20 mg by mouth at bedtime.  05/30/13   Tanda Rockers, MD  fenofibrate 160 MG tablet Take 160 mg by mouth daily.    [provider]  FLUoxetine (PROZAC) 20 MG capsule Take 40 mg by mouth daily.     [provider]  HYDROcodone-acetaminophen (NORCO) 5-325 MG tablet Take 1 tablet by mouth every 6 (six) hours as needed for moderate pain. 02/22/17   Tanda Rockers, MD  irbesartan (AVAPRO) 150 MG tablet Take 1 tablet (150 mg total) by mouth daily. 02/22/17   Tanda Rockers, MD  montelukast (SINGULAIR) 10 MG tablet Take 1 tablet (10 mg total) by mouth at bedtime. 03/28/14   Tanda Rockers, MD  Multiple Vitamins-Minerals (CENTRUM SILVER PO) Take 1 tablet by mouth daily.    [provider]  omeprazole (PRILOSEC) 40 MG capsule Take 40 mg by mouth daily.    [provider]  Respiratory Therapy Supplies (FLUTTER) DEVI Use as directed 06/27/13   Tanda Rockers, MD  solifenacin (VESICARE) 10 MG tablet Take 10 mg by mouth daily.    [provider]  traZODone (DESYREL) 50 MG tablet Take 50 mg by mouth at bedtime.    [provider]    Family History Family History  Problem Relation Age of Onset  . Stroke Father   . Asthma Father   . Heart disease Father        CABG x 2  . Lung cancer Sister        smoked  . Breast cancer Daughter 84    Social History Social History  Substance Use Topics  . Smoking status: Former Smoker    Packs/day: 0.75    Years: 20.00    Types: Cigarettes    Quit date: 07/06/1978  . Smokeless tobacco: Never Used  . Alcohol use 0.6 oz/week    1 Glasses of wine per week     Comment: occasional     Allergies   Nsaids and Prednisone   Review of Systems Review of Systems  Constitutional: Negative for appetite change, chills, diaphoresis, fatigue and fever.  HENT: Negative for mouth sores, sore throat and trouble swallowing.   Eyes: Negative for visual disturbance.  Respiratory: Negative for cough, chest tightness, shortness of breath and wheezing.   Cardiovascular: Negative for chest pain.  Gastrointestinal: Negative for abdominal distention, abdominal pain, diarrhea, nausea and vomiting.  Endocrine: Negative for polydipsia,  polyphagia and polyuria.  Genitourinary: Negative for dysuria, frequency and hematuria.  Musculoskeletal: Negative for gait problem.  Skin: Negative for color change, pallor and rash.  Neurological: Negative for dizziness, syncope, light-headedness and headaches.  Hematological: Does not bruise/bleed easily.  Psychiatric/Behavioral: Negative for behavioral problems and confusion.     Physical Exam Updated Vital Signs BP (!) 179/89   Pulse 90   Temp 98.4 F (36.9 C) (Oral)   Resp 20   Ht 5' 3.5" (1.613 m)   Wt 93 kg (205 lb)   SpO2 98%   BMI 35.74 kg/m   Physical Exam  Constitutional: She is oriented to  person, place, and time. She appears well-developed and well-nourished. No distress.  HENT:  Head: Normocephalic.  Eyes: Pupils are equal, round, and reactive to light. Conjunctivae are normal. No scleral icterus.  Neck: Normal range of motion. Neck supple. No thyromegaly present.  Cardiovascular: Normal rate and regular rhythm.  Exam reveals no gallop and no friction rub.   No murmur heard. Pulmonary/Chest: Effort normal and breath sounds normal. No respiratory distress. She has no wheezes. She has no rales.  Abdominal: Soft. Bowel sounds are normal. She exhibits no distension. There is no tenderness. There is no rebound.  Musculoskeletal: Normal range of motion.  Neurological: She is alert and oriented to person, place, and time.  Skin: Skin is warm and dry. No rash noted.  Psychiatric: She has a normal mood and affect. Her behavior is normal.     ED Treatments / Results  Labs (all labs ordered are listed, but only abnormal results are displayed) Labs Reviewed - No data to display  EKG  EKG Interpretation None       Radiology No results found.  Procedures Procedures (including critical care time)  Medications Ordered in ED Medications - No data to display   Initial Impression / Assessment and Plan / ED Course  I have reviewed the triage vital signs and  the nursing notes.  Pertinent labs & imaging results that were available during my care of the patient were reviewed by me and considered in my medical decision making (see chart for details).    Asymptomatic essential hypertension and 76 year old. Just on new medication for 3 days. We'll add chlorthalidone a.m. and ask her to check and record her blood pressures. She may be able to taper off of this as she has been on April for a longer period of time. Ask her to follow-up with her primary care physician. Return to ER with any symptoms.  Final Clinical Impressions(s) / ED Diagnoses   Final diagnoses:  Essential hypertension    New Prescriptions New Prescriptions   CHLORTHALIDONE (HYGROTON) 25 MG TABLET    Take 1 tablet (25 mg total) by mouth daily.     Tanna Furry, MD 02/25/17 [phone]

## 2017-03-04 ENCOUNTER — Ambulatory Visit: Payer: Medicare Other | Admitting: Acute Care

## 2017-03-11 ENCOUNTER — Encounter (INDEPENDENT_AMBULATORY_CARE_PROVIDER_SITE_OTHER): Payer: Medicare Other | Admitting: Ophthalmology

## 2017-03-11 DIAGNOSIS — H353132 Nonexudative age-related macular degeneration, bilateral, intermediate dry stage: Secondary | ICD-10-CM

## 2017-03-11 DIAGNOSIS — H34831 Tributary (branch) retinal vein occlusion, right eye, with macular edema: Secondary | ICD-10-CM | POA: Diagnosis not present

## 2017-03-11 DIAGNOSIS — H35033 Hypertensive retinopathy, bilateral: Secondary | ICD-10-CM | POA: Diagnosis not present

## 2017-03-11 DIAGNOSIS — H43813 Vitreous degeneration, bilateral: Secondary | ICD-10-CM | POA: Diagnosis not present

## 2017-03-11 DIAGNOSIS — I10 Essential (primary) hypertension: Secondary | ICD-10-CM

## 2017-03-15 ENCOUNTER — Encounter: Payer: Self-pay | Admitting: Adult Health

## 2017-03-15 ENCOUNTER — Ambulatory Visit (INDEPENDENT_AMBULATORY_CARE_PROVIDER_SITE_OTHER): Payer: Medicare Other | Admitting: Adult Health

## 2017-03-15 DIAGNOSIS — R05 Cough: Secondary | ICD-10-CM

## 2017-03-15 DIAGNOSIS — R059 Cough, unspecified: Secondary | ICD-10-CM

## 2017-03-15 NOTE — Patient Instructions (Addendum)
Follow med calendar closely and bring to each visit.  Continue on cough control regimen . Follow up Dr. Melvyn Novas  In 2-3 months in PFT and As needed   Follow up with Primary MD for blood pressure control

## 2017-03-15 NOTE — Progress Notes (Signed)
@Patient  ID: [name], female    DOB: 1941/06/08, 76 y.o.   MRN: [id number]  Chief Complaint  Patient presents with  . Follow-up    Cough    Referring provider: Hulan Fess, MD  HPI: 76 yo female former smoker followed for chronic cough  Lung cancer 2015 s/p resection (RLL lobectomy 2015)   TEST  Spirometry 02/23/13 wnl  - 07/03/2013 Sinus CT > Mild chronic sinus disease as described. No acute findings. Left-to-right nasal septal deviation of 3 mm. -med calendar 08/08/13 , redone 06/01/2016 and 02/22/2017  - Eos 4%   10/13/2013  > rec singulair daily  - FENO 05/12/2016  =   24 on singulair  - Allergy profile 05/12/2016 >   IgE  47 pos RAST ragweed /oak/ birch  -CT chest 02/09/2017 no evidence of disease recurrence, no findings of suspicious metastatic disease. Numerous chronic subcentimeter solid pulmonary nodules scattered.  03/15/2017 Follow up : Chronic cough  Patient returns for a one-month follow-up. Seen last visit for a flare of her cough. Patient was given a Solu-Medrol injection. Recommend on cough control. She was changed from her losartan to [facility] feels that her cough is better but not totally gone. Still has some post nasal drainage . Uses Chlortabs with some help   Patient did have some trouble with blood pressure issues. And was seen in the emergency room. She is following with her PCP for this now.   We reviewed all her medications organize them into a medication with patient education. Appears to be taking her medications correctly.  Allergies  Allergen Reactions  . Nsaids Other (See Comments)    Elevated kidney fx  . Prednisone Nausea And Vomiting    Can take Prednisone injection but not orally    Immunization History  Administered Date(s) Administered  . Influenza Split 04/05/2013  . Influenza, High Dose Seasonal PF 04/05/2016    Past Medical History:  Diagnosis Date  . Adenocarcinoma of lung, stage 1 ([place])    Status post right lower  lobectomy August 2015  . Anxiety   . Asthma   . Atrophic vaginitis   . Chronic iritis of left eye    Pupil stays dilated; nonreactive  . Colon polyp   . Contact lens/glasses fitting    wears contacts or glasses  . Depression   . Depression with anxiety   . Environmental allergies   . GERD (gastroesophageal reflux disease)   . Hypertriglyceridemia   . Kidney stones   . OA (osteoarthritis)   . Osteopenia   . PONV (postoperative nausea and vomiting)     Tobacco History: History  Smoking Status  . Former Smoker  . Packs/day: 0.75  . Years: 20.00  . Types: Cigarettes  . Quit date: 07/06/1978  Smokeless Tobacco  . Never Used   Counseling given: Not Answered   Outpatient Encounter Prescriptions as of 03/15/2017  Medication Sig  . albuterol (PROAIR HFA) 108 (90 BASE) MCG/ACT inhaler Inhale 2 puffs into the lungs every 6 (six) hours as needed for wheezing or shortness of breath.  . Ascorbic Acid (VITAMIN C) 100 MG tablet Take 100 mg by mouth daily.  Marland Kitchen Besifloxacin HCl (BESIVANCE) 0.6 % SUSP Apply to eye. Apply to eye once monthly  . Calcium-Magnesium-Vitamin D (CALCIUM 1200+D3 PO) Take 1 tablet by mouth at bedtime.  . chlorpheniramine (CHLOR-TRIMETON) 4 MG tablet Take 4 mg by mouth every 4 (four) hours as needed.   . chlorthalidone (HYGROTON) 25 MG tablet Take  1 tablet (25 mg total) by mouth daily.  . cyclobenzaprine (FLEXERIL) 10 MG tablet Take 10 mg by mouth 3 (three) times daily as needed.   Marland Kitchen dextromethorphan (DELSYM) 30 MG/5ML liquid Take 60 mg by mouth 2 (two) times daily as needed for cough.  . famotidine (PEPCID) 20 MG tablet Take 20 mg by mouth at bedtime.   . fenofibrate 160 MG tablet Take 160 mg by mouth daily.  Marland Kitchen FLUoxetine (PROZAC) 20 MG capsule Take 40 mg by mouth daily.   Marland Kitchen HYDROcodone-acetaminophen (NORCO) 5-325 MG tablet Take 1 tablet by mouth every 6 (six) hours as needed for moderate pain.  Marland Kitchen irbesartan (AVAPRO) 150 MG tablet Take 1 tablet (150 mg total) by  mouth daily.  . montelukast (SINGULAIR) 10 MG tablet Take 1 tablet (10 mg total) by mouth at bedtime.  . Multiple Vitamins-Minerals (CENTRUM SILVER PO) Take 1 tablet by mouth daily.  Marland Kitchen omeprazole (PRILOSEC) 40 MG capsule Take 40 mg by mouth daily.  Marland Kitchen Respiratory Therapy Supplies (FLUTTER) DEVI Use as directed  . solifenacin (VESICARE) 10 MG tablet Take 10 mg by mouth daily.  . traZODone (DESYREL) 50 MG tablet Take 50 mg by mouth at bedtime.   No facility-administered encounter medications on file as of 03/15/2017.      Review of Systems  Constitutional:   No  weight loss, night sweats,  Fevers, chills, fatigue, or  lassitude.  HEENT:   No headaches,  Difficulty swallowing,  Tooth/dental problems, or  Sore throat,                No sneezing, itching, ear ache,  +nasal congestion, post nasal drip,   CV:  No chest pain,  Orthopnea, PND, swelling in lower extremities, anasarca, dizziness, palpitations, syncope.   GI  No heartburn, indigestion, abdominal pain, nausea, vomiting, diarrhea, change in bowel habits, loss of appetite, bloody stools.   Resp: No shortness of breath with exertion or at rest.  No excess mucus, no productive cough,  No non-productive cough,  No coughing up of blood.  No change in color of mucus.  No wheezing.  No chest wall deformity  Skin: no rash or lesions.  GU: no dysuria, change in color of urine, no urgency or frequency.  No flank pain, no hematuria   MS:  No joint pain or swelling.  No decreased range of motion.  No back pain.    Physical Exam  BP 102/68 (BP Location: Right Arm, Cuff Size: Normal)   Pulse 85   Ht 5\' 3"  (1.6 m)   Wt 195 lb (88.5 kg)   SpO2 97%   BMI 34.54 kg/m   GEN: A/Ox3; pleasant , NAD, obese    HEENT:  [place]/AT,  EACs-clear, TMs-wnl, NOSE-clear, THROAT-clear, no lesions, no postnasal drip or exudate noted.   NECK:  Supple w/ fair ROM; no JVD; normal carotid impulses w/o bruits; no thyromegaly or nodules palpated; no  lymphadenopathy.    RESP  Clear  P & A; w/o, wheezes/ rales/ or rhonchi. no accessory muscle use, no dullness to percussion  CARD:  RRR, no m/r/g, no peripheral edema, pulses intact, no cyanosis or clubbing.  GI:   Soft & nt; nml bowel sounds; no organomegaly or masses detected.   Musco: Warm bil, no deformities or joint swelling noted.   Neuro: alert, no focal deficits noted.    Skin: Warm, no lesions or rashes    Lab Results:  CBC  BNP No results found for: BNP  ProBNP  No results found for: PROBNP  Imaging: No results found.   Assessment & Plan:   Cough Improving on current regimen  Patient's medications were reviewed today and patient education was given. Computerized medication calendar was adjusted/completed   Plan  Patient Instructions  Follow med calendar closely and bring to each visit.  Continue on cough control regimen . Follow up Dr. Melvyn Novas  In 2-3 months in PFT and As needed   Follow up with Primary MD for blood pressure control        Amaro Mangold, NP 03/15/2017

## 2017-03-15 NOTE — Assessment & Plan Note (Signed)
Improving on current regimen  Patient's medications were reviewed today and patient education was given. Computerized medication calendar was adjusted/completed   Plan  Patient Instructions  Follow med calendar closely and bring to each visit.  Continue on cough control regimen . Follow up Dr. Melvyn Novas  In 2-3 months in PFT and As needed   Follow up with Primary MD for blood pressure control

## 2017-03-16 NOTE — Progress Notes (Signed)
Chart and office note reviewed in detail  > agree with a/p as outlined    

## 2017-04-27 ENCOUNTER — Encounter (INDEPENDENT_AMBULATORY_CARE_PROVIDER_SITE_OTHER): Payer: Medicare Other | Admitting: Ophthalmology

## 2017-04-27 DIAGNOSIS — H35033 Hypertensive retinopathy, bilateral: Secondary | ICD-10-CM | POA: Diagnosis not present

## 2017-04-27 DIAGNOSIS — I10 Essential (primary) hypertension: Secondary | ICD-10-CM | POA: Diagnosis not present

## 2017-04-27 DIAGNOSIS — H353132 Nonexudative age-related macular degeneration, bilateral, intermediate dry stage: Secondary | ICD-10-CM | POA: Diagnosis not present

## 2017-04-27 DIAGNOSIS — H43813 Vitreous degeneration, bilateral: Secondary | ICD-10-CM

## 2017-04-27 DIAGNOSIS — H34831 Tributary (branch) retinal vein occlusion, right eye, with macular edema: Secondary | ICD-10-CM

## 2017-05-17 ENCOUNTER — Other Ambulatory Visit: Payer: Self-pay | Admitting: Internal Medicine

## 2017-05-17 DIAGNOSIS — R059 Cough, unspecified: Secondary | ICD-10-CM

## 2017-05-17 DIAGNOSIS — R05 Cough: Secondary | ICD-10-CM

## 2017-05-18 ENCOUNTER — Ambulatory Visit: Payer: Medicare Other | Admitting: Adult Health

## 2017-05-18 ENCOUNTER — Encounter: Payer: Self-pay | Admitting: Adult Health

## 2017-05-18 ENCOUNTER — Ambulatory Visit (INDEPENDENT_AMBULATORY_CARE_PROVIDER_SITE_OTHER): Payer: Medicare Other | Admitting: Internal Medicine

## 2017-05-18 DIAGNOSIS — J4530 Mild persistent asthma, uncomplicated: Secondary | ICD-10-CM | POA: Diagnosis not present

## 2017-05-18 DIAGNOSIS — R05 Cough: Secondary | ICD-10-CM | POA: Diagnosis not present

## 2017-05-18 DIAGNOSIS — R059 Cough, unspecified: Secondary | ICD-10-CM

## 2017-05-18 LAB — PULMONARY FUNCTION TEST
DL/VA % pred: 103 %
DL/VA: 4.86 ml/min/mmHg/L
DLCO cor % pred: 73 %
DLCO cor: 16.79 ml/min/mmHg
DLCO unc % pred: 70 %
DLCO unc: 16.19 ml/min/mmHg
FEF 25-75 Post: 0.83 L/sec
FEF 25-75 Pre: 0.49 L/sec
FEF2575-%Change-Post: 69 %
FEF2575-%Pred-Post: 54 %
FEF2575-%Pred-Pre: 32 %
FEV1-%Change-Post: 13 %
FEV1-%Pred-Post: 66 %
FEV1-%Pred-Pre: 58 %
FEV1-Post: 1.29 L
FEV1-Pre: 1.13 L
FEV1FVC-%Change-Post: 10 %
FEV1FVC-%Pred-Pre: 83 %
FEV6-%Change-Post: 3 %
FEV6-%Pred-Post: 75 %
FEV6-%Pred-Pre: 73 %
FEV6-Post: 1.88 L
FEV6-Pre: 1.82 L
FEV6FVC-%Change-Post: 0 %
FEV6FVC-%Pred-Post: 105 %
FEV6FVC-%Pred-Pre: 105 %
FVC-%Change-Post: 2 %
FVC-%Pred-Post: 71 %
FVC-%Pred-Pre: 69 %
FVC-Post: 1.88 L
FVC-Pre: 1.83 L
Post FEV1/FVC ratio: 69 %
Post FEV6/FVC ratio: 100 %
Pre FEV1/FVC ratio: 62 %
Pre FEV6/FVC Ratio: 100 %
RV % pred: 122 %
RV: 2.8 L
TLC % pred: 101 %
TLC: 4.98 L

## 2017-05-18 LAB — NITRIC OXIDE: Nitric Oxide: 24

## 2017-05-18 MED ORDER — BUDESONIDE-FORMOTEROL FUMARATE 80-4.5 MCG/ACT IN AERO: 2.0000 | INHALATION_SPRAY | Freq: Two times a day (BID) | RESPIRATORY_TRACT | 0 refills | Status: DC

## 2017-05-18 MED ORDER — BUDESONIDE-FORMOTEROL FUMARATE 80-4.5 MCG/ACT IN AERO: 2.0000 | INHALATION_SPRAY | Freq: Two times a day (BID) | RESPIRATORY_TRACT | 5 refills | Status: DC

## 2017-05-18 NOTE — Patient Instructions (Addendum)
Begin Symbicort 80 2 puffs Twice daily  , brush/rinse/gargle after use.  Continue on cough control regimen . Follow up Dr. Melvyn Novas  In 6-8 weeks and As needed  .  Please contact office for sooner follow up if symptoms do not improve or worsen or seek emergency care  Flu shot today .

## 2017-05-18 NOTE — Addendum Note (Signed)
Addended by: Parke Poisson E on: 05/18/2017 05:28 PM   Modules accepted: Orders

## 2017-05-18 NOTE — Progress Notes (Signed)
@Patient  ID: [name], female    DOB: [birth year], 76 y.o.   MRN: [id number]  Chief Complaint  Patient presents with  . Follow-up    cough     Referring provider: Hulan Fess, MD  HPI: 76 yo female former smoker followed for chronic cough , Lung cancer -Adenocarcinoma s/p S/p Right lower lobectomy August 2015.   Spirometry 02/23/13 wnl  - 12/29/2014Sinus CT >Mild chronic sinus disease as described. No acute findings. Left-to-right nasal septal deviation of 3 mm. -med calendar 08/08/13 , redone 11/27/2017and 02/22/2017 - Eos 4% 10/13/2013>rec singulair daily  - FENO 05/12/2016= 24 on singulair  - Allergy profile 05/12/2016>IgE 47 pos RAST ragweed /oak/ birch  -CT chest 02/09/2017 no evidence of disease recurrence, no findings of suspicious metastatic disease. Numerous chronic subcentimeter solid pulmonary nodules scattered.  05/18/2017 Follow up: Asthma/COPD , Lung Cancer , PFT  Patient presents for a 39-month follow-up.  Patient is been having difficulty with increased dry cough.  She was given a steroid injection.  Change from losartan to Avapro with some improvement in cough.  She underwent PFT today that showed moderate  airflow obstruction with an FEV1 at 66%, ratio 69, FVC 71%.  Positive bronchodilator response.  Significant small airway reversibility.  DLCO 70%. She was continued on her cough control regimen.  Today she is feeling about the same, still having cough and intermittent wheezing . No fever, discolored mucus, chest pain or orthopnea.  Using delsym As needed  Cough  .remains GERD and AR tx regimen .  FENO testing today 24 ppb.       Allergies  Allergen Reactions  . Nsaids Other (See Comments)    Elevated kidney fx  . Prednisone Nausea And Vomiting    Can take Prednisone injection but not orally    Immunization History  Administered Date(s) Administered  . Influenza Split 04/05/2013  . Influenza, High Dose Seasonal PF 04/05/2016     Past Medical History:  Diagnosis Date  . Adenocarcinoma of lung, stage 1 ([place])    Status post right lower lobectomy August 2015  . Anxiety   . Asthma   . Atrophic vaginitis   . Chronic iritis of left eye    Pupil stays dilated; nonreactive  . Colon polyp   . Contact lens/glasses fitting    wears contacts or glasses  . Depression   . Depression with anxiety   . Environmental allergies   . GERD (gastroesophageal reflux disease)   . Hypertriglyceridemia   . Kidney stones   . OA (osteoarthritis)   . Osteopenia   . PONV (postoperative nausea and vomiting)     Tobacco History: Social History   Tobacco Use  Smoking Status Former Smoker  . Packs/day: 0.75  . Years: 20.00  . Pack years: 15.00  . Types: Cigarettes  . Last attempt to quit: 07/06/1978  . Years since quitting: 38.8  Smokeless Tobacco Never Used   Counseling given: Not Answered   Outpatient Encounter Medications as of 05/18/2017  Medication Sig  . albuterol (PROAIR HFA) 108 (90 BASE) MCG/ACT inhaler Inhale 2 puffs into the lungs every 6 (six) hours as needed for wheezing or shortness of breath.  . Ascorbic Acid (VITAMIN C) 100 MG tablet Take 100 mg by mouth daily.  Marland Kitchen Besifloxacin HCl (BESIVANCE) 0.6 % SUSP Apply to eye. Apply to eye once monthly  . Calcium-Magnesium-Vitamin D (CALCIUM 1200+D3 PO) Take 1 tablet by mouth at bedtime.  . chlorpheniramine (CHLOR-TRIMETON) 4 MG tablet Take 4  mg by mouth every 4 (four) hours as needed.   . chlorthalidone (HYGROTON) 25 MG tablet Take 1 tablet (25 mg total) by mouth daily. (Patient not taking: Reported on 05/18/2017)  . cyclobenzaprine (FLEXERIL) 10 MG tablet Take 10 mg by mouth 3 (three) times daily as needed.   Marland Kitchen dextromethorphan (DELSYM) 30 MG/5ML liquid Take 60 mg by mouth 2 (two) times daily as needed for cough.  . famotidine (PEPCID) 20 MG tablet Take 20 mg by mouth at bedtime.   . fenofibrate 160 MG tablet Take 160 mg by mouth daily.  Marland Kitchen FLUoxetine (PROZAC) 20  MG capsule Take 40 mg by mouth daily.   Marland Kitchen HYDROcodone-acetaminophen (NORCO) 5-325 MG tablet Take 1 tablet by mouth every 6 (six) hours as needed for moderate pain.  Marland Kitchen irbesartan (AVAPRO) 150 MG tablet Take 1 tablet (150 mg total) by mouth daily.  . montelukast (SINGULAIR) 10 MG tablet Take 1 tablet (10 mg total) by mouth at bedtime.  . Multiple Vitamins-Minerals (CENTRUM SILVER PO) Take 1 tablet by mouth daily.  Marland Kitchen omeprazole (PRILOSEC) 40 MG capsule Take 40 mg by mouth daily.  Marland Kitchen Respiratory Therapy Supplies (FLUTTER) DEVI Use as directed  . solifenacin (VESICARE) 10 MG tablet Take 10 mg by mouth daily.  . traZODone (DESYREL) 50 MG tablet Take 50 mg by mouth at bedtime.   No facility-administered encounter medications on file as of 05/18/2017.      Review of Systems  Constitutional:   No  weight loss, night sweats,  Fevers, chills, fatigue, or  lassitude.  HEENT:   No headaches,  Difficulty swallowing,  Tooth/dental problems, or  Sore throat,                No sneezing, itching, ear ache,  +nasal congestion, post nasal drip,   CV:  No chest pain,  Orthopnea, PND, swelling in lower extremities, anasarca, dizziness, palpitations, syncope.   GI  No heartburn, indigestion, abdominal pain, nausea, vomiting, diarrhea, change in bowel habits, loss of appetite, bloody stools.   Resp:    No chest wall deformity  Skin: no rash or lesions.  GU: no dysuria, change in color of urine, no urgency or frequency.  No flank pain, no hematuria   MS:  No joint pain or swelling.  No decreased range of motion.  No back pain.    Physical Exam  BP 112/64 (BP Location: Left Arm, Cuff Size: Normal)   Pulse 66   Ht 5\' 3"  (1.6 m)   Wt 201 lb (91.2 kg)   SpO2 96%   BMI 35.61 kg/m   GEN: A/Ox3; pleasant , NAD, elderly    HEENT:  [place]/AT,  EACs-clear, TMs-wnl, NOSE-clear, THROAT-clear, no lesions, no postnasal drip or exudate noted.   NECK:  Supple w/ fair ROM; no JVD; normal carotid impulses w/o  bruits; no thyromegaly or nodules palpated; no lymphadenopathy.    RESP  Clear  P & A; w/o, wheezes/ rales/ or rhonchi. no accessory muscle use, no dullness to percussion  CARD:  RRR, no m/r/g, tr  peripheral edema, pulses intact, no cyanosis or clubbing.  GI:   Soft & nt; nml bowel sounds; no organomegaly or masses detected.   Musco: Warm bil, no deformities or joint swelling noted.   Neuro: alert, no focal deficits noted.    Skin: Warm, no lesions or rashes    Lab Results:  CBC   BMET  BNP No results found for: BNP  ProBNP No results found for: PROBNP  Imaging: No results found.   Assessment & Plan:   Asthma PFT is consistent with Asthma -Moderate chronic airflow obstruction (+reversibility)   Plan  Patient Instructions  Begin Symbicort 80 2 puffs Twice daily  , brush/rinse/gargle after use.  Continue on cough control regimen . Follow up Dr. Melvyn Novas  In 6-8 weeks and As needed  .  Please contact office for sooner follow up if symptoms do not improve or worsen or seek emergency care  Flu shot today .     Cough Chronic cough - flare ? Asthma component  Cont AR/GERD/Cough tx   Plan  Patient Instructions  Begin Symbicort 80 2 puffs Twice daily  , brush/rinse/gargle after use.  Continue on cough control regimen . Follow up Dr. Melvyn Novas  In 6-8 weeks and As needed  .  Please contact office for sooner follow up if symptoms do not improve or worsen or seek emergency care  Flu shot today .        Rexene Edison, NP 05/18/2017

## 2017-05-18 NOTE — Assessment & Plan Note (Signed)
PFT is consistent with Asthma -Moderate chronic airflow obstruction (+reversibility)   Plan  Patient Instructions  Begin Symbicort 80 2 puffs Twice daily  , brush/rinse/gargle after use.  Continue on cough control regimen . Follow up Dr. Melvyn Novas  In 6-8 weeks and As needed  .  Please contact office for sooner follow up if symptoms do not improve or worsen or seek emergency care  Flu shot today .

## 2017-05-18 NOTE — Assessment & Plan Note (Signed)
Chronic cough - flare ? Asthma component  Cont AR/GERD/Cough tx   Plan  Patient Instructions  Begin Symbicort 80 2 puffs Twice daily  , brush/rinse/gargle after use.  Continue on cough control regimen . Follow up Dr. Melvyn Novas  In 6-8 weeks and As needed  .  Please contact office for sooner follow up if symptoms do not improve or worsen or seek emergency care  Flu shot today .

## 2017-05-18 NOTE — Progress Notes (Signed)
Patient seen in the office today and instructed on use of Symbicort 80/4.7mcg.  Patient expressed understanding and demonstrated technique. Parke Poisson, CMA 05/18/17

## 2017-05-18 NOTE — Progress Notes (Signed)
PFT done today. 

## 2017-05-19 ENCOUNTER — Other Ambulatory Visit: Payer: Self-pay | Admitting: Dermatology

## 2017-05-20 NOTE — Progress Notes (Signed)
Chart and office note reviewed in detail  > agree with a/p as outlined    

## 2017-06-08 ENCOUNTER — Encounter (INDEPENDENT_AMBULATORY_CARE_PROVIDER_SITE_OTHER): Payer: Medicare Other | Admitting: Ophthalmology

## 2017-06-08 DIAGNOSIS — H43813 Vitreous degeneration, bilateral: Secondary | ICD-10-CM | POA: Diagnosis not present

## 2017-06-08 DIAGNOSIS — I10 Essential (primary) hypertension: Secondary | ICD-10-CM | POA: Diagnosis not present

## 2017-06-08 DIAGNOSIS — H353132 Nonexudative age-related macular degeneration, bilateral, intermediate dry stage: Secondary | ICD-10-CM | POA: Diagnosis not present

## 2017-06-08 DIAGNOSIS — H35033 Hypertensive retinopathy, bilateral: Secondary | ICD-10-CM

## 2017-06-08 DIAGNOSIS — H34831 Tributary (branch) retinal vein occlusion, right eye, with macular edema: Secondary | ICD-10-CM

## 2017-07-13 ENCOUNTER — Ambulatory Visit: Payer: Medicare Other | Admitting: Internal Medicine

## 2017-07-13 ENCOUNTER — Encounter: Payer: Self-pay | Admitting: Internal Medicine

## 2017-07-13 VITALS — BP 130/82 | HR 72 | Ht 63.0 in | Wt 198.8 lb

## 2017-07-13 DIAGNOSIS — J45991 Cough variant asthma: Secondary | ICD-10-CM | POA: Diagnosis not present

## 2017-07-13 NOTE — Patient Instructions (Addendum)
Work on inhaler technique:  relax and gently blow all the way out then take a nice smooth deep breath back in, triggering the inhaler at same time you start breathing in.  Hold for up to 5 seconds if you can. Blow out thru nose. Rinse and gargle with water when done     See calendar for specific medication instructions and bring it back for each and every office visit for every healthcare provider you see.  Without it,  you may not receive the best quality medical care that we feel you deserve.  You will note that the calendar groups together  your maintenance  medications that are timed at particular times of the day.  Think of this as your checklist for what your doctor has instructed you to do until your next evaluation to see what benefit  there is  to staying on a consistent group of medications intended to keep you well.  The other group at the bottom is entirely up to you to use as you see fit  for specific symptoms that may arise between visits that require you to treat them on an as needed basis.  Think of this as your action plan or "what if" list.   Separating the top medications from the bottom group is fundamental to providing you adequate care going forward.      Please schedule a follow up visit in 3 months but call sooner if needed  - bring med calendar and any list you use to see if they are consistent with each other

## 2017-07-13 NOTE — Progress Notes (Signed)
Subjective:   Patient ID: [name], female    DOB: 1940/10/09  MRN: [id number]   Brief patient profile:  53 yowf quit smoking 1980 allergies itching and sneezing took shots much improved when stopped shots and stayed  much better except some problem spring and fall and did ok until 40's then developed recurrent cough typically assoc with URI's and not with spring and fall  But much worse since  Early 2014 despite rx for  Asthma and gerd so referred 05/30/2013 to pulmonary clinic by Dr Lawernce Pitts    History of Present Illness  05/30/2013 1st [facility] office visit/ [name] cc daily cough x months which is so severe gets gagged and occ vomit and pos urinary incontinent.  Clear mucus esp p shower, no better with inhalers and acid suppression to date  rec  First take delsym two tsp every 12 hours and supplement if needed with  tramadol 50 mg up to 2 every 4 hours to suppress the urge to cough at all or even clear your throat. . Once you have eliminated the cough for 3 straight days try reducing the tramadol first,  then the delsym as tolerated.   Try prilosec 20mg   Take x 2  30-60 min before first meal of the day and Pepcid 20 mg one bedtime  Prednisone 10 mg take  4 each am x 2 days,   2 each am x 2 days,  1 each am x 2 days and stop   GERD diet    10/13/2013 f/u ov/[name] re: cough x one year, worse p shower then early evening, h1 not helping  Chief Complaint  Patient presents with  . Follow-up    Review CT from earlier this week.  C/o sinus congestion, PND, nonprod cough X3 weeks.      questionaire (unchanged pattern from previous ov)   Kouffman Reflux v Neurogenic Cough Differentiator Reflux Comments  Do you awaken from a sound sleep coughing violently?                            With trouble breathing?  not now   Do you have choking episodes when you cannot  Get enough air, gasping for air ?              Much better   Do you usually cough when you lie down into  The bed, or  when you just lie down to rest ?                          Recliner x 28m Due to back   Do you usually cough after meals or eating?         sometime   Do you cough when (or after) you bend over?    sometime   GERD SCORE     Kouffman Reflux v Neurogenic Cough Differentiator Neurogenic   Do you more-or-less cough all day long? Not now   Does change of temperature make you cough? yes   Does laughing or chuckling cause you to cough? yes   Do fumes (perfume, automobile fumes, burned  Toast, etc.,) cause you to cough ?      no   Does speaking, singing, or talking on the phone cause you to cough   ?               yes   Neurogenic/Airway score  rec Depomedrol 120 mg IM  singulair 10 mg one daily in evening    11/10/2013 f/u ov/[name] re: chronic cough / MPN's Chief Complaint  Patient presents with  . Follow-up    Pt states that her cough is unchanged since her last visit. No new co's today.   no noct cough at all, p stirring x 5 min then cough > dry but this is really minimal compared to previous Never produces any mucus rec See calendar for specific medication instructions    We will call you for follow up CT in first week in July.   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 Acute Visit    Increased cough x 6 wks- started on fosamax at that time. She has also been wheezing. Cough is mainly non prod, occ will produce very minimal clear sputum. She is using proair 6 x daily on average.   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  Continue on cough control regimen    07/13/2017  f/u ov/[name] re: chronic cough improved on symb 80 with 75% hfa effectiveness  Chief Complaint  Patient presents with  . Follow-up   Not limited by breathing from desired activities    No obvious day to day or daytime variability or assoc excess/ purulent sputum or mucus plugs or hemoptysis or cp or chest tightness, subjective wheeze or overt sinus or hb symptoms. No unusual exposure hx or h/o childhood pna/ asthma or knowledge of premature birth.  Sleeping ok flat without nocturnal  or early am exacerbation  of respiratory  c/o's or need for noct saba. Also denies any obvious fluctuation of symptoms with weather or environmental changes or other aggravating or alleviating factors except as outlined above   Current Allergies, Complete Past Medical History, Past Surgical History, Family History, and Social History were reviewed in [employer] record.  ROS  The following are not active complaints unless bolded Hoarseness, sore throat, dysphagia, dental problems, itching, sneezing,  nasal congestion or discharge of excess mucus or purulent secretions, ear ache,   fever, chills, sweats, unintended wt loss or wt gain, classically pleuritic or exertional cp,  orthopnea pnd or leg swelling, presyncope, palpitations, abdominal pain,  anorexia,  nausea, vomiting, diarrhea  or change in bowel habits or change in bladder habits, change in stools or change in urine, dysuria, hematuria,  rash, arthralgias, visual complaints, headache, numbness, weakness or ataxia or problems with walking or coordination,  change in mood/affect or memory.        Current Meds  Medication Sig  . Ascorbic Acid (VITAMIN C) 100 MG tablet Take 100 mg by mouth daily.  Marland Kitchen Besifloxacin HCl (BESIVANCE) 0.6 % SUSP Apply to eye. Apply to eye once monthly  . budesonide-formoterol (SYMBICORT) 80-4.5 MCG/ACT inhaler Inhale 2 puffs 2 (two) times daily into the lungs.  . Calcium-Magnesium-Vitamin D (CALCIUM 1200+D3 PO) Take 1 tablet by mouth at bedtime.  . chlorpheniramine (CHLOR-TRIMETON) 4 MG tablet Take 4 mg by mouth every 4 (four) hours as needed.   . chlorthalidone (HYGROTON) 25 MG tablet Take 1 tablet (25 mg total) by mouth daily.  Marland Kitchen dextromethorphan (DELSYM) 30 MG/5ML liquid Take 60 mg by mouth 2 (two) times daily as needed for cough.  . famotidine (PEPCID) 20 MG tablet Take 20 mg by mouth at bedtime.   . fenofibrate 160 MG tablet Take 160 mg by mouth daily.  Marland Kitchen FLUoxetine (PROZAC) 20 MG capsule Take 40 mg by mouth daily.   Marland Kitchen HYDROcodone-acetaminophen (NORCO) 5-325 MG tablet Take 1 tablet by mouth every 6 (six) hours as needed for moderate pain.  Marland Kitchen irbesartan (AVAPRO) 150 MG tablet Take 1 tablet (150 mg total) by mouth daily.  . montelukast (SINGULAIR) 10 MG tablet Take 1 tablet (10 mg total) by mouth at bedtime.  . Multiple Vitamins-Minerals (CENTRUM SILVER PO) Take 1 tablet by mouth daily.  Marland Kitchen omeprazole (PRILOSEC) 40 MG capsule Take 40 mg by mouth daily.  Marland Kitchen Respiratory Therapy Supplies (FLUTTER) DEVI Use as directed  . solifenacin (VESICARE) 10 MG tablet Take 10 mg by mouth daily.  . traZODone (DESYREL) 50 MG tablet Take 50 mg by mouth at bedtime.               Objective:   Physical Exam   amb wf nad / occ still has spont harsh upper airway cough   Vital  signs reviewed - Note on arrival 02 sats  98% on RA      07/25/2013 197>>196 08/08/2013 > 09/05/2013 198 > 10/13/2013  205 > 11/10/2013  208 > 05/12/2016  214   > 02/23/2017 205 > 07/13/2017  198   HEENT: nl dentition, turbinates bilaterally, and oropharynx. Nl external ear canals without cough reflex   NECK :  without JVD/Nodes/TM/ nl carotid upstrokes bilaterally   LUNGS: no acc muscle use,  Nl contour chest which is clear to A and P bilaterally without cough on insp or exp maneuvers   CV:  RRR  no s3 or murmur or increase in P2, and no edema   ABD:  soft and nontender with nl inspiratory excursion in the supine position. No bruits or organomegaly appreciated, bowel sounds nl  MS:  Nl gait/ ext warm without deformities, calf tenderness, cyanosis or clubbing No obvious joint restrictions   SKIN: warm and dry without lesions    NEURO:  alert, approp, nl sensorium with  no motor or cerebellar deficits apparent.

## 2017-07-14 ENCOUNTER — Encounter: Payer: Self-pay | Admitting: Internal Medicine

## 2017-07-14 NOTE — Assessment & Plan Note (Addendum)
-   Spirometry 02/23/13 wnl  - 07/03/2013 Sinus CT > Mild chronic sinus disease as described. No acute findings. Left-to-right nasal septal deviation of 3 mm. -med calendar 08/08/13 , redone 06/01/2016 and 02/22/2017  - Eos 4%   10/13/2013  > rec singulair daily  - FENO 05/12/2016  =   24 on singulair  - Allergy profile 05/12/2016 >   IgE  47 pos RAST ragweed /oak/ birch  - 02/22/2017 added flutter valve and d/c'd fosfamax  - PFT's  07/13/2017  FEV1 1.29 (66 % ) ratio 69  p 13 % improvement from saba p nothing prior to study with DLCO  70/73 % corrects to 103 % for alv volume  And ERV 42% - 05/18/17 added symb 80 2bid - 07/13/2017  After extensive coaching inhaler device  effectiveness =    75%   Clearly better on symb 80 though not using it with max effectiveness > no change rx for now, just work on hfa     Each maintenance medication was reviewed in detail including most importantly the difference between maintenance and as needed and under what circumstances the prns are to be used. This was done in the context of a medication calendar review which provided the patient with a user-friendly unambiguous mechanism for medication administration and reconciliation and provides an action plan for all active problems. It is critical that this be shown to every doctor  for modification during the office visit if necessary so the patient can use it as a working document.

## 2017-07-20 ENCOUNTER — Encounter (INDEPENDENT_AMBULATORY_CARE_PROVIDER_SITE_OTHER): Payer: Medicare Other | Admitting: Ophthalmology

## 2017-07-20 DIAGNOSIS — H353132 Nonexudative age-related macular degeneration, bilateral, intermediate dry stage: Secondary | ICD-10-CM | POA: Diagnosis not present

## 2017-07-20 DIAGNOSIS — I10 Essential (primary) hypertension: Secondary | ICD-10-CM

## 2017-07-20 DIAGNOSIS — H43813 Vitreous degeneration, bilateral: Secondary | ICD-10-CM

## 2017-07-20 DIAGNOSIS — H35033 Hypertensive retinopathy, bilateral: Secondary | ICD-10-CM | POA: Diagnosis not present

## 2017-07-20 DIAGNOSIS — H34831 Tributary (branch) retinal vein occlusion, right eye, with macular edema: Secondary | ICD-10-CM

## 2017-08-24 ENCOUNTER — Other Ambulatory Visit: Payer: Self-pay | Admitting: Family Medicine

## 2017-08-24 DIAGNOSIS — Z139 Encounter for screening, unspecified: Secondary | ICD-10-CM

## 2017-08-26 ENCOUNTER — Ambulatory Visit
Admission: RE | Admit: 2017-08-26 | Discharge: 2017-08-26 | Disposition: A | Discharge status: Home / Self Care | Payer: Medicare Other | Source: Ambulatory Visit | Attending: Family Medicine | Admitting: Family Medicine

## 2017-08-26 DIAGNOSIS — Z139 Encounter for screening, unspecified: Secondary | ICD-10-CM

## 2017-08-31 ENCOUNTER — Encounter (INDEPENDENT_AMBULATORY_CARE_PROVIDER_SITE_OTHER): Payer: Medicare Other | Admitting: Ophthalmology

## 2017-08-31 DIAGNOSIS — H43813 Vitreous degeneration, bilateral: Secondary | ICD-10-CM | POA: Diagnosis not present

## 2017-08-31 DIAGNOSIS — I10 Essential (primary) hypertension: Secondary | ICD-10-CM | POA: Diagnosis not present

## 2017-08-31 DIAGNOSIS — H35033 Hypertensive retinopathy, bilateral: Secondary | ICD-10-CM | POA: Diagnosis not present

## 2017-08-31 DIAGNOSIS — H34831 Tributary (branch) retinal vein occlusion, right eye, with macular edema: Secondary | ICD-10-CM

## 2017-08-31 DIAGNOSIS — H353132 Nonexudative age-related macular degeneration, bilateral, intermediate dry stage: Secondary | ICD-10-CM

## 2017-09-28 ENCOUNTER — Encounter (INDEPENDENT_AMBULATORY_CARE_PROVIDER_SITE_OTHER): Payer: Medicare Other | Admitting: Ophthalmology

## 2017-09-28 DIAGNOSIS — H43813 Vitreous degeneration, bilateral: Secondary | ICD-10-CM | POA: Diagnosis not present

## 2017-09-28 DIAGNOSIS — H353132 Nonexudative age-related macular degeneration, bilateral, intermediate dry stage: Secondary | ICD-10-CM | POA: Diagnosis not present

## 2017-09-28 DIAGNOSIS — H35033 Hypertensive retinopathy, bilateral: Secondary | ICD-10-CM | POA: Diagnosis not present

## 2017-09-28 DIAGNOSIS — I10 Essential (primary) hypertension: Secondary | ICD-10-CM | POA: Diagnosis not present

## 2017-09-28 DIAGNOSIS — H34831 Tributary (branch) retinal vein occlusion, right eye, with macular edema: Secondary | ICD-10-CM

## 2017-10-08 ENCOUNTER — Ambulatory Visit (HOSPITAL_COMMUNITY): Payer: Medicare Other

## 2017-10-12 ENCOUNTER — Ambulatory Visit: Payer: Medicare Other | Admitting: Internal Medicine

## 2017-10-15 ENCOUNTER — Encounter (HOSPITAL_COMMUNITY): Payer: Self-pay

## 2017-10-15 ENCOUNTER — Ambulatory Visit (HOSPITAL_COMMUNITY)
Admission: RE | Admit: 2017-10-15 | Discharge: 2017-10-15 | Disposition: A | Discharge status: Home / Self Care | Payer: Medicare Other | Source: Ambulatory Visit | Attending: Family Medicine | Admitting: Family Medicine

## 2017-10-15 DIAGNOSIS — M810 Age-related osteoporosis without current pathological fracture: Secondary | ICD-10-CM | POA: Insufficient documentation

## 2017-10-15 MED ORDER — ZOLEDRONIC ACID 5 MG/100ML IV SOLN
5.0000 mg | Freq: Once | INTRAVENOUS | Status: AC
  Administered 2017-10-15: 5 mg via INTRAVENOUS
  Filled 2017-10-15: qty 100

## 2017-10-15 MED ORDER — SODIUM CHLORIDE 0.9 % IV SOLN
Freq: Once | INTRAVENOUS | Status: AC
  Administered 2017-10-15: 14:00:00 via INTRAVENOUS

## 2017-10-15 NOTE — Discharge Instructions (Signed)
Zoledronic Acid injection (Paget's Disease, Osteoporosis) / reclast What is this medicine? ZOLEDRONIC ACID (ZOE le dron ik AS id) lowers the amount of calcium loss from bone. It is used to treat Paget's disease and osteoporosis in women. This medicine may be used for other purposes; ask your health care provider or pharmacist if you have questions. COMMON BRAND NAME(S): Reclast, Zometa What should I tell my health care provider before I take this medicine? They need to know if you have any of these conditions: -aspirin-sensitive asthma -cancer, especially if you are receiving medicines used to treat cancer -dental disease or wear dentures -infection -kidney disease -low levels of calcium in the blood -past surgery on the parathyroid gland or intestines -receiving corticosteroids like dexamethasone or prednisone -an unusual or allergic reaction to zoledronic acid, other medicines, foods, dyes, or preservatives -pregnant or trying to get pregnant -breast-feeding How should I use this medicine? This medicine is for infusion into a vein. It is given by a health care professional in a hospital or clinic setting. Talk to your pediatrician regarding the use of this medicine in children. This medicine is not approved for use in children. Overdosage: If you think you have taken too much of this medicine contact a poison control center or emergency room at once. NOTE: This medicine is only for you. Do not share this medicine with others. What if I miss a dose? It is important not to miss your dose. Call your doctor or health care professional if you are unable to keep an appointment. What may interact with this medicine? -certain antibiotics given by injection -NSAIDs, medicines for pain and inflammation, like ibuprofen or naproxen -some diuretics like bumetanide, furosemide -teriparatide This list may not describe all possible interactions. Give your health care provider a list of all the  medicines, herbs, non-prescription drugs, or dietary supplements you use. Also tell them if you smoke, drink alcohol, or use illegal drugs. Some items may interact with your medicine. What should I watch for while using this medicine? Visit your doctor or health care professional for regular checkups. It may be some time before you see the benefit from this medicine. Do not stop taking your medicine unless your doctor tells you to. Your doctor may order blood tests or other tests to see how you are doing. Women should inform their doctor if they wish to become pregnant or think they might be pregnant. There is a potential for serious side effects to an unborn child. Talk to your health care professional or pharmacist for more information. You should make sure that you get enough calcium and vitamin D while you are taking this medicine. Discuss the foods you eat and the vitamins you take with your health care professional. Some people who take this medicine have severe bone, joint, and/or muscle pain. This medicine may also increase your risk for jaw problems or a broken thigh bone. Tell your doctor right away if you have severe pain in your jaw, bones, joints, or muscles. Tell your doctor if you have any pain that does not go away or that gets worse. Tell your dentist and dental surgeon that you are taking this medicine. You should not have major dental surgery while on this medicine. See your dentist to have a dental exam and fix any dental problems before starting this medicine. Take good care of your teeth while on this medicine. Make sure you see your dentist for regular follow-up appointments. What side effects may I notice from receiving  this medicine? Side effects that you should report to your doctor or health care professional as soon as possible: -allergic reactions like skin rash, itching or hives, swelling of the face, lips, or tongue -anxiety, confusion, or depression -breathing  problems -changes in vision -eye pain -feeling faint or lightheaded, falls -jaw pain, especially after dental work -mouth sores -muscle cramps, stiffness, or weakness -redness, blistering, peeling or loosening of the skin, including inside the mouth -trouble passing urine or change in the amount of urine Side effects that usually do not require medical attention (report to your doctor or health care professional if they continue or are bothersome): -bone, joint, or muscle pain -constipation -diarrhea -fever -hair loss -irritation at site where injected -loss of appetite -nausea, vomiting -stomach upset -trouble sleeping -trouble swallowing -weak or tired This list may not describe all possible side effects. Call your doctor for medical advice about side effects. You may report side effects to FDA at 1-800-FDA-1088. Where should I keep my medicine? This drug is given in a hospital or clinic and will not be stored at home. NOTE: This sheet is a summary. It may not cover all possible information. If you have questions about this medicine, talk to your doctor, pharmacist, or health care provider.  2018 Elsevier/Gold Standard (2013-11-18 14:19:57)

## 2017-10-26 ENCOUNTER — Encounter: Payer: Self-pay | Admitting: Internal Medicine

## 2017-10-26 ENCOUNTER — Ambulatory Visit: Payer: Medicare Other | Admitting: Internal Medicine

## 2017-10-26 VITALS — BP 124/78 | HR 60 | Ht 63.0 in | Wt 200.2 lb

## 2017-10-26 DIAGNOSIS — J45991 Cough variant asthma: Secondary | ICD-10-CM | POA: Diagnosis not present

## 2017-10-26 NOTE — Progress Notes (Signed)
Subjective:   Patient ID: [name], female    DOB: 06-Feb-1941  MRN: [id number]   Brief patient profile:  39 yowf quit smoking 1980 allergies itching and sneezing took shots much improved when stopped shots and stayed  much better except some problem spring and fall and did ok until 40's then developed recurrent cough typically assoc with URI's and not with spring and fall  But much worse since  Early 2014 despite rx for  Asthma and gerd so referred 05/30/2013 to pulmonary clinic by Dr Lawernce Pitts    History of Present Illness  05/30/2013 1st [facility] office visit/ [name] cc daily cough x months which is so severe gets gagged and occ vomit and pos urinary incontinent.  Clear mucus esp p shower, no better with inhalers and acid suppression to date  rec  First take delsym two tsp every 12 hours and supplement if needed with  tramadol 50 mg up to 2 every 4 hours to suppress the urge to cough at all or even clear your throat. . Once you have eliminated the cough for 3 straight days try reducing the tramadol first,  then the delsym as tolerated.   Try prilosec 20mg   Take x 2  30-60 min before first meal of the day and Pepcid 20 mg one bedtime  Prednisone 10 mg take  4 each am x 2 days,   2 each am x 2 days,  1 each am x 2 days and stop   GERD diet    10/13/2013 f/u ov/[name] re: cough x one year, worse p shower then early evening, h1 not helping  Chief Complaint  Patient presents with  . Follow-up    Review CT from earlier this week.  C/o sinus congestion, PND, nonprod cough X3 weeks.      questionaire (unchanged pattern from previous ov)   Kouffman Reflux v Neurogenic Cough Differentiator Reflux Comments  Do you awaken from a sound sleep coughing violently?                            With trouble breathing?  not now   Do you have choking episodes when you cannot  Get enough air, gasping for air ?              Much better   Do you usually cough when you lie down into  The bed, or  when you just lie down to rest ?                          Recliner x 11m Due to back   Do you usually cough after meals or eating?         sometime   Do you cough when (or after) you bend over?    sometime   GERD SCORE     Kouffman Reflux v Neurogenic Cough Differentiator Neurogenic   Do you more-or-less cough all day long? Not now   Does change of temperature make you cough? yes   Does laughing or chuckling cause you to cough? yes   Do fumes (perfume, automobile fumes, burned  Toast, etc.,) cause you to cough ?      no   Does speaking, singing, or talking on the phone cause you to cough   ?               yes   Neurogenic/Airway score  rec Depomedrol 120 mg IM  singulair 10 mg one daily in evening    11/10/2013 f/u ov/[name] re: chronic cough / MPN's Chief Complaint  Patient presents with  . Follow-up    Pt states that her cough is unchanged since her last visit. No new co's today.   no noct cough at all, p stirring x 5 min then cough > dry but this is really minimal compared to previous Never produces any mucus rec See calendar for specific medication instructions    We will call you for follow up CT in first week in July.   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 Acute Visit    Increased cough x 6 wks- started on fosamax at that time. She has also been wheezing. Cough is mainly non prod, occ will produce very minimal clear sputum. She is using proair 6 x daily on average.   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  Continue on cough control regimen    07/13/2017  f/u ov/[name] re: chronic cough improved on symb 80 with 75% hfa effectiveness  Chief Complaint  Patient presents with  . Follow-up   Not limited by breathing from desired activities   rec Work on inhaler technique:  Use med calendar      10/26/2017  f/u ov/[name] re: cough variant asthma maint rx symb with suboptimal hfa / singulair / no med calendar Chief Complaint  Patient presents with  . Follow-up    Breathing is overall doing well. She states she is using her albuterol inhaler once per day on average.    Dyspnea:  Limited by knees not breathing Cough: around 4 pm  Sleep: at 30 degrees but just slept on couch at daughters and did fine SABA use: 4pm only   Overall the best she's done in years  No obvious day to day or daytime variability or assoc excess/ purulent sputum or mucus plugs or hemoptysis or cp or chest tightness, subjective wheeze or overt sinus or hb symptoms. No unusual exposure hx or h/o childhood pna/ asthma or knowledge of premature birth.  Sleeping fine elevated or flat without nocturnal  or early am exacerbation  of respiratory  c/o's or  need for noct saba. Also denies any obvious fluctuation of symptoms with weather or environmental changes or other aggravating or alleviating factors except as outlined above   Current Allergies,  Complete Past Medical History, Past Surgical History, Family History, and Social History were reviewed in [employer] record.  ROS  The following are not active complaints unless bolded Hoarseness, sore throat, dysphagia, dental problems, itching, sneezing,  nasal congestion or discharge of excess mucus or purulent secretions, ear ache,   fever, chills, sweats, unintended wt loss or wt gain, classically pleuritic or exertional cp,  orthopnea pnd or arm/hand swelling  or leg swelling, presyncope, palpitations, abdominal pain, anorexia, nausea, vomiting, diarrhea  or change in bowel habits or change in bladder habits, change in stools or change in urine, dysuria, hematuria,  rash, arthralgias, visual complaints, headache, numbness, weakness or ataxia or problems with walking or coordination,  change in mood or  memory.        Current Meds  Medication Sig  . albuterol (PROAIR HFA) 108 (90 BASE) MCG/ACT inhaler Inhale 2 puffs into the lungs every 6 (six) hours as needed for wheezing or shortness of breath.  . Ascorbic Acid (VITAMIN C) 100 MG tablet Take 100 mg by mouth daily.  Marland Kitchen Besifloxacin HCl (BESIVANCE) 0.6 % SUSP Apply to eye. Apply to eye once monthly  . budesonide-formoterol (SYMBICORT) 80-4.5 MCG/ACT inhaler Inhale 2 puffs 2 (two) times daily into the lungs.  . Calcium-Magnesium-Vitamin D (CALCIUM 1200+D3 PO) Take 1 tablet by mouth at bedtime.  . chlorpheniramine (CHLOR-TRIMETON) 4 MG tablet Take 4 mg by mouth every 4 (four) hours as needed.   . chlorthalidone (HYGROTON) 25 MG tablet Take 1 tablet (25 mg total) by mouth daily.  Marland Kitchen dextromethorphan (DELSYM) 30 MG/5ML liquid Take 60 mg by mouth 2 (two) times daily as needed for cough.  . famotidine (PEPCID) 20 MG tablet Take 20 mg by mouth at bedtime.   . fenofibrate 160 MG tablet Take 160 mg by mouth daily.  Marland Kitchen FLUoxetine (PROZAC) 20 MG capsule Take 40 mg by mouth daily.   Marland Kitchen HYDROcodone-acetaminophen (NORCO) 5-325 MG tablet  Take 1 tablet by mouth every 6 (six) hours as needed for moderate pain.  Marland Kitchen irbesartan (AVAPRO) 150 MG tablet Take 1 tablet (150 mg total) by mouth daily.  . montelukast (SINGULAIR) 10 MG tablet Take 1 tablet (10 mg total) by mouth at bedtime.  . Multiple Vitamins-Minerals (CENTRUM SILVER PO) Take 1 tablet by mouth daily.  Marland Kitchen omeprazole (PRILOSEC) 40 MG capsule Take 40 mg by mouth daily.  Marland Kitchen Respiratory Therapy Supplies (FLUTTER) DEVI Use as directed  . solifenacin (VESICARE) 10 MG tablet Take 10 mg by mouth daily.  . traZODone (DESYREL) 50 MG tablet Take 50 mg by mouth at bedtime.                 Objective:   Physical Exam    amb obese hoarse wf with classic voice fatigue  Vital signs reviewed - Note on arrival 02 sats  96% on RA    07/25/2013 197>>196 08/08/2013 > 09/05/2013 198 > 10/13/2013  205 > 11/10/2013  208 > 05/12/2016  214   > 02/23/2017 205 > 07/13/2017  198 >  10/26/2017   200   HEENT: nl dentition, turbinates bilaterally, and oropharynx. Nl external ear canals without cough reflex   NECK :  without JVD/Nodes/TM/ nl carotid upstrokes bilaterally   LUNGS: no acc muscle use,  Nl contour chest which is clear to  A and P bilaterally without cough on insp or exp maneuvers   CV:  RRR  no s3 or murmur or increase in P2, and no edema   ABD:  soft and nontender with nl inspiratory excursion in the supine position. No bruits or organomegaly appreciated, bowel sounds nl  MS:  Nl gait/ ext warm without deformities, calf tenderness, cyanosis or clubbing No obvious joint restrictions   SKIN: warm and dry without lesions    NEURO:  alert, approp, nl sensorium with  no motor or cerebellar deficits apparent.

## 2017-10-26 NOTE — Patient Instructions (Addendum)
No change medications   Work on inhaler technique:  relax and gently blow all the way out then take a nice smooth deep breath back in, triggering the inhaler at same time you start breathing in.  Hold for up to 5 seconds if you can. Blow out thru nose. Rinse and gargle with water when done    Please schedule a follow up visit in 6 months but call sooner if needed

## 2017-10-26 NOTE — Assessment & Plan Note (Addendum)
-  Eos 4%   10/13/2013  > rec singulair daily  - FENO 05/12/2016  =   24 on singulair  - Allergy profile 05/12/2016 >   IgE  47 pos RAST ragweed /oak/ birch  - 02/22/2017 added flutter valve and d/c'd fosfamax  - PFT's  07/13/2017  FEV1 1.29 (66 % ) ratio 69  p 13 % improvement from saba p nothing prior to study with DLCO  70/73 % corrects to 103 % for alv volume  And ERV 42% - 05/18/17 added symb 80 2bid  - 10/26/2017  After extensive coaching inhaler device  effectiveness =    75%   Despite suboptimal hfa and not using med calendar as instructed > All goals of chronic asthma control met including optimal function and elimination of symptoms with relatively low need for rescue therapy which I think could be eliminated by better hfa technique/ reviewed   Contingencies discussed in full including contacting this office immediately if not controlling the symptoms using the rule of two's.       Each maintenance medication was reviewed in detail including most importantly the difference between maintenance and as needed and under what circumstances the prns are to be used.  Please see AVS for specific  Instructions which are unique to this visit and I personally typed out  which were reviewed in detail in writing with the patient and a copy provided.

## 2017-11-09 ENCOUNTER — Encounter (INDEPENDENT_AMBULATORY_CARE_PROVIDER_SITE_OTHER): Payer: Medicare Other | Admitting: Ophthalmology

## 2017-11-09 DIAGNOSIS — H43813 Vitreous degeneration, bilateral: Secondary | ICD-10-CM | POA: Diagnosis not present

## 2017-11-09 DIAGNOSIS — H34831 Tributary (branch) retinal vein occlusion, right eye, with macular edema: Secondary | ICD-10-CM

## 2017-11-09 DIAGNOSIS — I10 Essential (primary) hypertension: Secondary | ICD-10-CM | POA: Diagnosis not present

## 2017-11-09 DIAGNOSIS — H35033 Hypertensive retinopathy, bilateral: Secondary | ICD-10-CM | POA: Diagnosis not present

## 2017-11-09 DIAGNOSIS — H353132 Nonexudative age-related macular degeneration, bilateral, intermediate dry stage: Secondary | ICD-10-CM | POA: Diagnosis not present

## 2017-12-21 ENCOUNTER — Encounter (INDEPENDENT_AMBULATORY_CARE_PROVIDER_SITE_OTHER): Payer: Medicare Other | Admitting: Ophthalmology

## 2017-12-21 DIAGNOSIS — H353132 Nonexudative age-related macular degeneration, bilateral, intermediate dry stage: Secondary | ICD-10-CM | POA: Diagnosis not present

## 2017-12-21 DIAGNOSIS — H34831 Tributary (branch) retinal vein occlusion, right eye, with macular edema: Secondary | ICD-10-CM

## 2017-12-21 DIAGNOSIS — H35033 Hypertensive retinopathy, bilateral: Secondary | ICD-10-CM | POA: Diagnosis not present

## 2017-12-21 DIAGNOSIS — I10 Essential (primary) hypertension: Secondary | ICD-10-CM

## 2017-12-21 DIAGNOSIS — H43813 Vitreous degeneration, bilateral: Secondary | ICD-10-CM | POA: Diagnosis not present

## 2018-01-13 ENCOUNTER — Other Ambulatory Visit: Payer: Self-pay | Admitting: Thoracic Surgery (Cardiothoracic Vascular Surgery)

## 2018-01-13 DIAGNOSIS — C3490 Malignant neoplasm of unspecified part of unspecified bronchus or lung: Secondary | ICD-10-CM

## 2018-01-19 ENCOUNTER — Other Ambulatory Visit: Payer: Self-pay | Admitting: Thoracic Surgery (Cardiothoracic Vascular Surgery)

## 2018-01-19 DIAGNOSIS — R911 Solitary pulmonary nodule: Secondary | ICD-10-CM

## 2018-01-19 DIAGNOSIS — R918 Other nonspecific abnormal finding of lung field: Secondary | ICD-10-CM

## 2018-01-25 ENCOUNTER — Encounter (INDEPENDENT_AMBULATORY_CARE_PROVIDER_SITE_OTHER): Payer: Medicare Other | Admitting: Ophthalmology

## 2018-01-25 DIAGNOSIS — I10 Essential (primary) hypertension: Secondary | ICD-10-CM

## 2018-01-25 DIAGNOSIS — H43813 Vitreous degeneration, bilateral: Secondary | ICD-10-CM

## 2018-01-25 DIAGNOSIS — H34831 Tributary (branch) retinal vein occlusion, right eye, with macular edema: Secondary | ICD-10-CM | POA: Diagnosis not present

## 2018-01-25 DIAGNOSIS — H353132 Nonexudative age-related macular degeneration, bilateral, intermediate dry stage: Secondary | ICD-10-CM

## 2018-01-25 DIAGNOSIS — H35033 Hypertensive retinopathy, bilateral: Secondary | ICD-10-CM | POA: Diagnosis not present

## 2018-02-15 ENCOUNTER — Encounter: Payer: Self-pay | Admitting: Thoracic Surgery (Cardiothoracic Vascular Surgery)

## 2018-02-15 ENCOUNTER — Ambulatory Visit: Payer: Medicare Other | Admitting: Thoracic Surgery (Cardiothoracic Vascular Surgery)

## 2018-02-15 ENCOUNTER — Ambulatory Visit
Admission: RE | Admit: 2018-02-15 | Discharge: 2018-02-15 | Disposition: A | Discharge status: Home / Self Care | Payer: Medicare Other | Source: Ambulatory Visit | Attending: Thoracic Surgery (Cardiothoracic Vascular Surgery) | Admitting: Thoracic Surgery (Cardiothoracic Vascular Surgery)

## 2018-02-15 ENCOUNTER — Other Ambulatory Visit: Payer: Self-pay

## 2018-02-15 VITALS — BP 120/70 | HR 67 | Resp 16 | Ht 63.0 in | Wt 201.0 lb

## 2018-02-15 DIAGNOSIS — Z902 Acquired absence of lung [part of]: Secondary | ICD-10-CM | POA: Diagnosis not present

## 2018-02-15 DIAGNOSIS — R918 Other nonspecific abnormal finding of lung field: Secondary | ICD-10-CM

## 2018-02-15 DIAGNOSIS — C3491 Malignant neoplasm of unspecified part of right bronchus or lung: Secondary | ICD-10-CM | POA: Diagnosis not present

## 2018-02-15 DIAGNOSIS — R911 Solitary pulmonary nodule: Secondary | ICD-10-CM

## 2018-02-15 NOTE — Progress Notes (Signed)
[name]       [place] [address]             [phone]    HPI: [name] returns for a scheduled follow-up visit  [name] is a 77 year old woman with a history of COPD with remote tobacco abuse (quit almost 40 years ago).  She also has a history of depression with anxiety, reflux, osteoarthritis, and obesity.  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  She did not require adjuvant therapy.  She has multiple groundglass opacities and small solid lung nodules bilaterally.  These have been stable over time.  She has been having some problems with wheezing and coughing and shortness of breath this summer.  She says the humidity really affects her breathing.  Otherwise she feels well with no chest pain, pressure, or tightness.  No change in appetite or weight loss.  No new bone or joint pain.  Past Medical History:  Diagnosis Date  . Adenocarcinoma of lung, stage 1 ([place])    Status post right lower lobectomy August 2015  . Anxiety   . Asthma   . Atrophic vaginitis   . Chronic iritis of left eye    Pupil stays dilated; nonreactive  . Colon polyp   . Contact lens/glasses fitting    wears contacts or glasses  . Depression   . Depression with anxiety   . Environmental allergies   . GERD (gastroesophageal reflux disease)   . Hypertriglyceridemia   . Kidney stones   . OA (osteoarthritis)   . Osteopenia   . PONV (postoperative nausea and vomiting)     Current Outpatient Medications  Medication Sig Dispense Refill  . albuterol (PROAIR HFA) 108 (90 BASE) MCG/ACT inhaler Inhale 2 puffs into the lungs every 6 (six) hours as needed for wheezing or shortness of breath. 1 Inhaler 4  . Ascorbic Acid (VITAMIN C) 100 MG tablet Take 100 mg by mouth daily.    Marland Kitchen Besifloxacin HCl (BESIVANCE) 0.6 % SUSP Apply to eye. Apply to eye once monthly    . budesonide-formoterol (SYMBICORT) 80-4.5 MCG/ACT inhaler Inhale 2 puffs  2 (two) times daily into the lungs. 1 Inhaler 5  . Calcium-Magnesium-Vitamin D (CALCIUM 1200+D3 PO) Take 1 tablet by mouth at bedtime.    . chlorpheniramine (CHLOR-TRIMETON) 4 MG tablet Take 4 mg by mouth every 4 (four) hours as needed.     . chlorthalidone (HYGROTON) 25 MG tablet Take 1 tablet (25 mg total) by mouth daily. 30 tablet 0  . dextromethorphan (DELSYM) 30 MG/5ML liquid Take 60 mg by mouth 2 (two) times daily as needed for cough.    . famotidine (PEPCID) 20 MG tablet Take 20 mg by mouth at bedtime.     . fenofibrate 160 MG tablet Take 160 mg by mouth daily.    Marland Kitchen FLUoxetine (PROZAC) 20 MG capsule Take 40 mg by mouth daily.     Marland Kitchen HYDROcodone-acetaminophen (NORCO) 5-325 MG tablet Take 1 tablet by mouth every 6 (six) hours as needed for moderate pain. 30 tablet 0  . irbesartan (AVAPRO) 150 MG tablet Take 1 tablet (150 mg total) by mouth daily. 30 tablet 11  . montelukast (SINGULAIR) 10 MG tablet Take 1 tablet (10 mg total) by mouth at bedtime. 90 tablet 0  . Multiple Vitamins-Minerals (CENTRUM SILVER PO) Take 1 tablet by mouth daily.    Marland Kitchen omeprazole (PRILOSEC) 40 MG capsule Take 40 mg  by mouth daily.    Marland Kitchen Respiratory Therapy Supplies (FLUTTER) DEVI Use as directed 1 each 0  . solifenacin (VESICARE) 10 MG tablet Take 10 mg by mouth daily.    . traZODone (DESYREL) 50 MG tablet Take 50 mg by mouth at bedtime.     No current facility-administered medications for this visit.     Physical Exam BP 120/70 (BP Location: Right Arm, Patient Position: Sitting, Cuff Size: Large) Comment (Cuff Size): manually  Pulse 67   Resp 16   Ht 5\' 3"  (1.6 m)   Wt 201 lb (91.2 kg)   SpO2 97% Comment: RA  BMI 35.71 kg/m  77 year old woman in no acute distress Alert and oriented x3 with no focal deficits Cardiac regular rate and rhythm normal S1 and S2 No cervical or supraclavicular adenopathy Lungs diminished at right base, no rales or wheezing Incisions well-healed  Diagnostic Tests: CT CHEST  WITHOUT CONTRAST  TECHNIQUE: Multidetector CT imaging of the chest was performed following the standard protocol without IV contrast.  COMPARISON:  02/09/2017  FINDINGS: Cardiovascular: Mild cardiac enlargement. Aortic atherosclerosis noted. Calcification in the LAD, left main, left circumflex and RCA coronary arteries noted.  Mediastinum/Nodes: No enlarged mediastinal or axillary lymph nodes. Thyroid gland, trachea, and esophagus demonstrate no significant findings.  Lungs/Pleura: No pleural effusion. Status post partial right lower lobectomy. Multiple solid, non solid and part solid nodules are again noted scattered throughout both lungs. The ground-glass attenuating nodule within the right upper lobe measures 2.9 cm, image 20/8. Unchanged from previous exam. Stable solid nodule within the posterior right upper lobe measuring 3 mm, image 22/8. Unchanged appearance of 8 mm ground-glass nodule in the posterior right upper lobe. Left upper lobe part solid nodule measures 1.4 cm and has a central solid component measuring 4 mm. Similar to previous exam. Solid right upper lobe lung nodule is unchanged measuring 3 mm, image 53/8. 5 mm solid nodule within the left upper lobe is unchanged, image 22/8.  The  Upper Abdomen: 1.2 cm gallstone.  No acute abnormality identified.  Musculoskeletal: Spondylosis identified within the thoracic spine. No aggressive lytic or sclerotic bone lesions  IMPRESSION: 1. Status post partial right lower lobectomy. No findings identified to suggest residual or recurrent tumor. 2. Stable appearance of scattered solid, non solid and part solid nodules noted throughout both lungs. 3.  Aortic Atherosclerosis (ICD10-I70.0). 4. Gallstones.   Electronically Signed   By: Kerby Moors M.D.   On: 02/15/2018 09:05 I personally reviewed the CT images and concur with the findings noted above  Impression: [name] is a 77 year old  non-smoker who had a right lower lobectomy for stage Ia non-small cell carcinoma in 2015.  She now is 4 years out with no evidence of recurrent disease.  She does have multiple groundglass opacities in her lungs bilaterally.  The largest is in the right upper lobe.  It is a pure GGO with a diameter of about 2.8 cm.  This has been unchanged going back to 2015.  The remainder of the GGO are also unchanged.  She also has some small solid nodules that are unchanged going back to her original scans.  COPD-followed by Dr. Melvyn Novas.  Generally well controlled although she does have difficulty with heat and humidity during the summer.  Plan: Return in 1 year with CT chest for 5-year follow-up.  Melrose Nakayama, MD [facility] [phone]

## 2018-03-01 ENCOUNTER — Encounter (INDEPENDENT_AMBULATORY_CARE_PROVIDER_SITE_OTHER): Payer: Medicare Other | Admitting: Ophthalmology

## 2018-03-01 DIAGNOSIS — H35033 Hypertensive retinopathy, bilateral: Secondary | ICD-10-CM | POA: Diagnosis not present

## 2018-03-01 DIAGNOSIS — H43813 Vitreous degeneration, bilateral: Secondary | ICD-10-CM

## 2018-03-01 DIAGNOSIS — I10 Essential (primary) hypertension: Secondary | ICD-10-CM | POA: Diagnosis not present

## 2018-03-01 DIAGNOSIS — H34831 Tributary (branch) retinal vein occlusion, right eye, with macular edema: Secondary | ICD-10-CM

## 2018-03-01 DIAGNOSIS — H353132 Nonexudative age-related macular degeneration, bilateral, intermediate dry stage: Secondary | ICD-10-CM | POA: Diagnosis not present

## 2018-04-04 ENCOUNTER — Other Ambulatory Visit: Payer: Self-pay | Admitting: Internal Medicine

## 2018-04-12 ENCOUNTER — Encounter (INDEPENDENT_AMBULATORY_CARE_PROVIDER_SITE_OTHER): Payer: Medicare Other | Admitting: Ophthalmology

## 2018-04-12 DIAGNOSIS — H35033 Hypertensive retinopathy, bilateral: Secondary | ICD-10-CM

## 2018-04-12 DIAGNOSIS — I10 Essential (primary) hypertension: Secondary | ICD-10-CM

## 2018-04-12 DIAGNOSIS — H353132 Nonexudative age-related macular degeneration, bilateral, intermediate dry stage: Secondary | ICD-10-CM | POA: Diagnosis not present

## 2018-04-12 DIAGNOSIS — H34831 Tributary (branch) retinal vein occlusion, right eye, with macular edema: Secondary | ICD-10-CM

## 2018-04-12 DIAGNOSIS — H43813 Vitreous degeneration, bilateral: Secondary | ICD-10-CM | POA: Diagnosis not present

## 2018-05-03 ENCOUNTER — Ambulatory Visit: Payer: Medicare Other | Admitting: Internal Medicine

## 2018-05-03 ENCOUNTER — Encounter: Payer: Self-pay | Admitting: Internal Medicine

## 2018-05-03 VITALS — BP 140/88 | HR 72 | Ht 63.0 in | Wt 201.6 lb

## 2018-05-03 DIAGNOSIS — J45991 Cough variant asthma: Secondary | ICD-10-CM | POA: Diagnosis not present

## 2018-05-03 MED ORDER — GABAPENTIN 100 MG PO CAPS
100.0000 mg | ORAL_CAPSULE | Freq: Three times a day (TID) | ORAL | 2 refills | Status: DC
Start: 2018-05-03 — End: 2018-05-25

## 2018-05-03 NOTE — Progress Notes (Signed)
Subjective:   Patient ID: [name], female    DOB: January 07, 1941  MRN: [id number]   Brief patient profile:  2 yowf quit smoking 1980 allergies itching and sneezing took shots much improved when stopped shots and stayed  much better except some problem spring and fall and did ok until 40's then developed recurrent cough typically assoc with URI's and not with spring and fall  But much worse since  Early 2014 despite rx for  Asthma and gerd so referred 05/30/2013 to pulmonary clinic by Dr Lawernce Pitts    History of Present Illness  05/30/2013 1st [facility] office visit/ Krisann Mckenna cc daily cough x months which is so severe gets gagged and occ vomit and pos urinary incontinent.  Clear mucus esp p shower, no better with inhalers and acid suppression to date  rec  First take delsym two tsp every 12 hours and supplement if needed with  tramadol 50 mg up to 2 every 4 hours to suppress the urge to cough at all or even clear your throat. . Once you have eliminated the cough for 3 straight days try reducing the tramadol first,  then the delsym as tolerated.   Try prilosec 20mg   Take x 2  30-60 min before first meal of the day and Pepcid 20 mg one bedtime  Prednisone 10 mg take  4 each am x 2 days,   2 each am x 2 days,  1 each am x 2 days and stop   GERD diet    10/13/2013 f/u ov/Doug Bucklin re: cough x one year, worse p shower then early evening, h1 not helping  Chief Complaint  Patient presents with  . Follow-up    Review CT from earlier this week.  C/o sinus congestion, PND, nonprod cough X3 weeks.      questionaire (unchanged pattern from previous ov)   Kouffman Reflux v Neurogenic Cough Differentiator Reflux Comments  Do you awaken from a sound sleep coughing violently?                            With trouble breathing?  not now   Do you have choking episodes when you cannot  Get enough air, gasping for air ?              Much better   Do you usually cough when you lie down into  The bed, or  when you just lie down to rest ?                          Recliner x 40m Due to back   Do you usually cough after meals or eating?         sometime   Do you cough when (or after) you bend over?    sometime   GERD SCORE     Kouffman Reflux v Neurogenic Cough Differentiator Neurogenic   Do you more-or-less cough all day long? Not now   Does change of temperature make you cough? yes   Does laughing or chuckling cause you to cough? yes   Do fumes (perfume, automobile fumes, burned  Toast, etc.,) cause you to cough ?      no   Does speaking, singing, or talking on the phone cause you to cough   ?               yes   Neurogenic/Airway score  rec Depomedrol 120 mg IM  singulair 10 mg one daily in evening    11/10/2013 f/u ov/Otillia Cordone re: chronic cough / MPN's Chief Complaint  Patient presents with  . Follow-up    Pt states that her cough is unchanged since her last visit. No new co's today.   no noct cough at all, p stirring x 5 min then cough > dry but this is really minimal compared to previous Never produces any mucus rec See calendar for specific medication instructions    We will call you for follow up CT in first week in July.   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 Acute Visit    Increased cough x 6 wks- started on fosamax at that time. She has also been wheezing. Cough is mainly non prod, occ will produce very minimal clear sputum. She is using proair 6 x daily on average.   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 Follow-up    Breathing is overall doing well. She has been coughing some for the past 2 wks- non prod.  She has not had to use her albuterol inhaler recently.   Dyspnea:  Slowed down by L knee/ not doing steps anymore, very slow gait Cough: more x 2 weeks/ day > noct/ harsh dry early on insp assoc with sense of globus Sleeping: hob 30-40 degrees / one pillow SABA use: none lately  02: no    No obvious day to day or daytime variability or assoc excess/ purulent sputum or mucus plugs or hemoptysis or cp or chest tightness, subjective wheeze or overt sinus or hb symptoms.   Sleeping as above without nocturnal  or early am exacerbation  of respiratory  c/o's or need for noct saba. Also denies any obvious fluctuation of symptoms with weather or environmental changes or other aggravating or alleviating factors except as outlined above   No unusual exposure hx or h/o childhood pna/ asthma or knowledge of premature birth.  Current Allergies, Complete Past  Medical History, Past Surgical History, Family History, and Social History were reviewed in [employer] record.  ROS  The following are not active  complaints unless bolded Hoarseness, sore throat, dysphagia, dental problems, itching, sneezing,  nasal congestion or discharge of excess mucus or purulent secretions, ear ache,   fever, chills, sweats, unintended wt loss or wt gain, classically pleuritic or exertional cp,  orthopnea pnd or arm/hand swelling  or leg swelling, presyncope, palpitations, abdominal pain, anorexia, nausea, vomiting, diarrhea  or change in bowel habits or change in bladder habits, change in stools or change in urine, dysuria, hematuria,  rash, arthralgias, visual complaints, headache, numbness, weakness or ataxia or problems with walking or coordination,  change in mood or  memory.        Current Meds  Medication Sig  . albuterol (PROAIR HFA) 108 (90 BASE) MCG/ACT inhaler Inhale 2 puffs into the lungs every 6 (six) hours as needed for wheezing or shortness of breath.  . Ascorbic Acid (VITAMIN C) 100 MG tablet Take 100 mg by mouth daily.  Marland Kitchen Besifloxacin HCl (BESIVANCE) 0.6 % SUSP Apply to eye. Apply to eye once monthly  . budesonide-formoterol (SYMBICORT) 80-4.5 MCG/ACT inhaler Inhale 2 puffs 2 (two) times daily into the lungs.  . Calcium-Magnesium-Vitamin D (CALCIUM 1200+D3 PO) Take 1 tablet by mouth at bedtime.  . chlorpheniramine (CHLOR-TRIMETON) 4 MG tablet Take 4 mg by mouth every 4 (four) hours as needed.   . chlorthalidone (HYGROTON) 25 MG tablet Take 1 tablet (25 mg total) by mouth daily.  Marland Kitchen dextromethorphan (DELSYM) 30 MG/5ML liquid Take 60 mg by mouth 2 (two) times daily as needed for cough.  . famotidine (PEPCID) 20 MG tablet Take 20 mg by mouth at bedtime.   . fenofibrate 160 MG tablet Take 160 mg by mouth daily.  Marland Kitchen FLUoxetine (PROZAC) 20 MG capsule Take 40 mg by mouth daily.   Marland Kitchen HYDROcodone-acetaminophen (NORCO) 5-325 MG tablet Take 1 tablet by mouth every 6 (six) hours as needed for moderate pain.  Marland Kitchen irbesartan (AVAPRO) 150 MG tablet TAKE 1 TABLET BY MOUTH EVERY DAY  . montelukast (SINGULAIR) 10 MG tablet  Take 1 tablet (10 mg total) by mouth at bedtime.  . Multiple Vitamins-Minerals (CENTRUM SILVER PO) Take 1 tablet by mouth daily.  Marland Kitchen omeprazole (PRILOSEC) 40 MG capsule Take 40 mg by mouth daily.  Marland Kitchen Respiratory Therapy Supplies (FLUTTER) DEVI Use as directed  . solifenacin (VESICARE) 10 MG tablet Take 10 mg by mouth daily.  . traZODone (DESYREL) 50 MG tablet Take 50 mg by mouth at bedtime.                Objective:   Physical Exam   amb obese wf nad cough on deep insp   07/25/2013 197>>196 08/08/2013 > 09/05/2013 198 > 10/13/2013  205 > 11/10/2013  208 > 05/12/2016  214   > 02/23/2017 205 > 07/13/2017  198 >  10/26/2017   200 > 05/03/2018  201    Vital signs reviewed - Note on arrival 02 sats  94% on RA     HEENT: nl dentition, turbinates bilaterally, and oropharynx. Nl external ear canals without cough reflex   NECK :  without JVD/Nodes/TM/ nl carotid upstrokes bilaterally   LUNGS: no acc muscle use,  Nl contour chest which is clear to A and P bilaterally with harsh barking dry  cough on insp   maneuvers   CV:  RRR  no s3 or murmur or increase in P2,  and no edema   ABD:  soft and nontender with nl inspiratory excursion in the supine position. No bruits or organomegaly appreciated, bowel sounds nl  MS:  Nl gait/ ext warm without deformities, calf tenderness, cyanosis or clubbing No obvious joint restrictions   SKIN: warm and dry without lesions    NEURO:  alert, approp, nl sensorium with  no motor or cerebellar deficits apparent.

## 2018-05-03 NOTE — Patient Instructions (Addendum)
Add gabapentin 100 mg three times a day as per your med calendar - take all your bedtime meds an hour before bedtime and avoid using mint based toothpaste  Work on inhaler technique:  relax and gently blow all the way out then take a nice smooth deep breath back in, triggering the inhaler at same time you start breathing in.  Hold for up to 5 seconds if you can. Blow out thru nose. Rinse and gargle with water when done      Please schedule a follow up visit in 3 months but call sooner if needed

## 2018-05-04 ENCOUNTER — Encounter: Payer: Self-pay | Admitting: Internal Medicine

## 2018-05-04 NOTE — Assessment & Plan Note (Addendum)
-   Spirometry 02/23/13 wnl  - 07/03/2013 Sinus CT > Mild chronic sinus disease - No acute findings. Left-to-right nasal septal deviation of 3 mm. -med calendar 08/08/13 , redone 06/01/2016 and 02/22/2017  - Eos 4%   10/13/2013  > rec singulair daily  - FENO 05/12/2016  =   24 on singulair  - Allergy profile 05/12/2016 >   IgE  47 pos RAST ragweed /oak/ birch  - 02/22/2017 added flutter valve and d/c'd fosfamax  - PFT's  07/13/2017  FEV1 1.29 (66 % ) ratio 69  p 13 % improvement from saba p nothing prior to study with DLCO  70/73 % corrects to 103 % for alv volume  And ERV 42% - 05/18/17 added symb 80 2bid  - 05/03/2018  After extensive coaching inhaler device,  effectiveness =    75% but caused severe coughing fits on insp so I strongly  Suspect uacs component   Upper airway cough syndrome (previously labeled PNDS),  is so named because it's frequently impossible to sort out how much is  CR/sinusitis with freq throat clearing (which can be related to primary GERD)   vs  causing  secondary (" extra esophageal")  GERD from wide swings in gastric pressure that occur with throat clearing, often  promoting self use of mint and menthol lozenges that reduce the lower esophageal sphincter tone and exacerbate the problem further in a cyclical fashion.   These are the same pts (now being labeled as having "irritable larynx syndrome" by some cough centers) who not infrequently have a history of having failed to tolerate ace inhibitors,  dry powder inhalers or biphosphonates or report having atypical/extraesophageal reflux symptoms that don't respond to standard doses of PPI  and are easily confused as having aecopd or asthma flares by even experienced allergists/ pulmonologists (myself included).    >>> add gabapentin trial 100 mg tid if tolerated  Ok for Knee surgery if needed   I had an extended discussion with the patient reviewing all relevant studies completed to date and  lasting 15 to 20 minutes of a 25  minute visit    See device teaching which extended face to face time for this visit.  Each maintenance medication was reviewed in detail including emphasizing most importantly the difference between maintenance and prns and under what circumstances the prns are to be triggered using an action plan format that is not reflected in the computer generated alphabetically organized AVS which I have not found useful in most complex patients, especially with respiratory illnesses  Please see AVS for specific instructions unique to this visit that I personally wrote and verbalized to the the pt in detail and then reviewed with pt  by my nurse highlighting any  changes in therapy recommended at today's visit to their plan of care.

## 2018-05-24 ENCOUNTER — Encounter (INDEPENDENT_AMBULATORY_CARE_PROVIDER_SITE_OTHER): Payer: Medicare Other | Admitting: Ophthalmology

## 2018-05-25 ENCOUNTER — Other Ambulatory Visit: Payer: Self-pay | Admitting: Internal Medicine

## 2018-05-26 ENCOUNTER — Encounter (INDEPENDENT_AMBULATORY_CARE_PROVIDER_SITE_OTHER): Payer: Medicare Other | Admitting: Ophthalmology

## 2018-05-26 DIAGNOSIS — I10 Essential (primary) hypertension: Secondary | ICD-10-CM

## 2018-05-26 DIAGNOSIS — H34831 Tributary (branch) retinal vein occlusion, right eye, with macular edema: Secondary | ICD-10-CM

## 2018-05-26 DIAGNOSIS — H43813 Vitreous degeneration, bilateral: Secondary | ICD-10-CM | POA: Diagnosis not present

## 2018-05-26 DIAGNOSIS — H35033 Hypertensive retinopathy, bilateral: Secondary | ICD-10-CM | POA: Diagnosis not present

## 2018-05-26 DIAGNOSIS — H353132 Nonexudative age-related macular degeneration, bilateral, intermediate dry stage: Secondary | ICD-10-CM

## 2018-06-14 ENCOUNTER — Telehealth: Payer: Self-pay | Admitting: Internal Medicine

## 2018-06-14 MED ORDER — BUDESONIDE-FORMOTEROL FUMARATE 80-4.5 MCG/ACT IN AERO: 2.0000 | INHALATION_SPRAY | Freq: Two times a day (BID) | RESPIRATORY_TRACT | 5 refills | Status: DC

## 2018-06-14 NOTE — Telephone Encounter (Signed)
Called patient unable to reach left message to give [place] a call back. Order has been sent in for refill.

## 2018-07-14 ENCOUNTER — Encounter (INDEPENDENT_AMBULATORY_CARE_PROVIDER_SITE_OTHER): Payer: Medicare Other | Admitting: Ophthalmology

## 2018-07-14 DIAGNOSIS — H34831 Tributary (branch) retinal vein occlusion, right eye, with macular edema: Secondary | ICD-10-CM | POA: Diagnosis not present

## 2018-07-14 DIAGNOSIS — H35033 Hypertensive retinopathy, bilateral: Secondary | ICD-10-CM | POA: Diagnosis not present

## 2018-07-14 DIAGNOSIS — H43813 Vitreous degeneration, bilateral: Secondary | ICD-10-CM

## 2018-07-14 DIAGNOSIS — I10 Essential (primary) hypertension: Secondary | ICD-10-CM

## 2018-07-14 DIAGNOSIS — H353132 Nonexudative age-related macular degeneration, bilateral, intermediate dry stage: Secondary | ICD-10-CM

## 2018-08-04 ENCOUNTER — Encounter: Payer: Self-pay | Admitting: Internal Medicine

## 2018-08-04 ENCOUNTER — Ambulatory Visit: Payer: Medicare Other | Admitting: Internal Medicine

## 2018-08-04 VITALS — BP 190/100 | HR 64 | Ht 63.0 in | Wt 202.2 lb

## 2018-08-04 DIAGNOSIS — J45991 Cough variant asthma: Secondary | ICD-10-CM

## 2018-08-04 DIAGNOSIS — I10 Essential (primary) hypertension: Secondary | ICD-10-CM | POA: Diagnosis not present

## 2018-08-04 MED ORDER — BUDESONIDE-FORMOTEROL FUMARATE 80-4.5 MCG/ACT IN AERO: 2.0000 | INHALATION_SPRAY | Freq: Two times a day (BID) | RESPIRATORY_TRACT | 0 refills | Status: DC

## 2018-08-04 MED ORDER — LOSARTAN POTASSIUM 100 MG PO TABS: 100.0000 mg | ORAL_TABLET | Freq: Every day | ORAL | 11 refills | Status: DC

## 2018-08-04 NOTE — Progress Notes (Signed)
Subjective:   Patient ID: [name], female    DOB: Jan 09, 1941  MRN: [id number]   Brief patient profile:  65 yowf quit smoking 1980 allergies itching and sneezing took shots much improved when stopped shots and stayed  much better except some problem spring and fall and did ok until 40's then developed recurrent cough typically assoc with URI's and not with spring and fall  But much worse since  Early 2014 despite rx for  Asthma and gerd so referred 05/30/2013 to pulmonary clinic by Dr Lawernce Pitts    History of Present Illness  05/30/2013 1st [facility] office visit/ [name] cc daily cough x months which is so severe gets gagged and occ vomit and pos urinary incontinent.  Clear mucus esp p shower, no better with inhalers and acid suppression to date  rec  First take delsym two tsp every 12 hours and supplement if needed with  tramadol 50 mg up to 2 every 4 hours to suppress the urge to cough at all or even clear your throat. . Once you have eliminated the cough for 3 straight days try reducing the tramadol first,  then the delsym as tolerated.   Try prilosec 20mg   Take x 2  30-60 min before first meal of the day and Pepcid 20 mg one bedtime  Prednisone 10 mg take  4 each am x 2 days,   2 each am x 2 days,  1 each am x 2 days and stop   GERD diet    10/13/2013 f/u ov/[name] re: cough x one year, worse p shower then early evening, h1 not helping  Chief Complaint  Patient presents with  . Follow-up    Review CT from earlier this week.  C/o sinus congestion, PND, nonprod cough X3 weeks.      questionaire (unchanged pattern from previous ov)   Kouffman Reflux v Neurogenic Cough Differentiator Reflux Comments  Do you awaken from a sound sleep coughing violently?                            With trouble breathing?  not now   Do you have choking episodes when you cannot  Get enough air, gasping for air ?              Much better   Do you usually cough when you lie down into  The bed, or  when you just lie down to rest ?                          Recliner x 85m Due to back   Do you usually cough after meals or eating?         sometime   Do you cough when (or after) you bend over?    sometime   GERD SCORE     Kouffman Reflux v Neurogenic Cough Differentiator Neurogenic   Do you more-or-less cough all day long? Not now   Does change of temperature make you cough? yes   Does laughing or chuckling cause you to cough? yes   Do fumes (perfume, automobile fumes, burned  Toast, etc.,) cause you to cough ?      no   Does speaking, singing, or talking on the phone cause you to cough   ?               yes   Neurogenic/Airway score  rec Depomedrol 120 mg IM  singulair 10 mg one daily in evening    11/10/2013 f/u ov/[name] re: chronic cough / MPN's Chief Complaint  Patient presents with  . Follow-up    Pt states that her cough is unchanged since her last visit. No new co's today.   no noct cough at all, p stirring x 5 min then cough > dry but this is really minimal compared to previous Never produces any mucus rec See calendar for specific medication instructions    We will call you for follow up CT in first week in July.   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 Acute Visit    Increased cough x 6 wks- started on fosamax at that time. She has also been wheezing. Cough is mainly non prod, occ will produce very minimal clear sputum. She is using proair 6 x daily on average.   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 Follow-up    Breathing is overall doing well. She has been coughing some for the past 2 wks- non prod.  She has not had to use her albuterol inhaler recently.   Dyspnea:  Slowed down by L knee/ not doing steps anymore, very slow gait Cough: more x 2 weeks/ day > noct/ harsh dry early on insp assoc with sense of globus Sleeping: hob 30-40 degrees / one pillow rec Add gabapentin 100 mg three times a day as per your med calendar - take all your bedtime meds an hour before bedtime and avoid using mint based toothpaste Work on inhaler technique:     08/04/2018  f/u ov/[name] re: cough variant asthma vs uacs on symbicort 80 2 bid / gabapentin 100 tid/ confused with approp use of pill box (one slot per day but med calendar doesn't correspond to once daily dosing on many meds, one of which, ppi, is 30 min ac  Chief Complaint  Patient presents with  . Follow-up    Breathing is doing well. She  only has occ cough, mainly in the am's with clear sputum. She has not had to use her proair.   Dyspnea:  Limited by L knee/ ok at HT Cough: min/ assoc with hoarse   Sleeping: hob 30 degrees/ no pillows SABA use: none 02: none    No obvious  day to day or daytime variability or assoc excess/ purulent sputum or mucus plugs or hemoptysis or cp or chest tightness, subjective wheeze or overt sinus or hb symptoms.   Sleeping as above without nocturnal  or early am exacerbation  of respiratory  c/o's or need for noct saba. Also denies any obvious fluctuation of symptoms with weather or environmental changes or other aggravating or alleviating factors except as outlined above   No unusual exposure hx or h/o childhood pna/ asthma or knowledge of premature birth.  Current Allergies, Complete Past Medical History, Past Surgical History, Family History, and Social History were reviewed in [employer] record.  ROS  The following are not active complaints unless bolded Hoarseness, sore throat, dysphagia, dental problems, itching, sneezing,  nasal congestion or discharge of excess mucus or purulent secretions, ear ache,   fever, chills, sweats, unintended wt loss or wt gain, classically pleuritic or exertional cp,  orthopnea pnd or arm/hand swelling  or leg swelling, presyncope, palpitations, abdominal pain, anorexia, nausea, vomiting, diarrhea  or change in bowel habits or change in bladder habits, change in stools or change in urine, dysuria, hematuria,  rash, arthralgias, visual complaints, headache, numbness, weakness or ataxia or problems with walking or coordination,  change in mood or  memory.        Current Meds  Medication Sig  . albuterol (PROAIR HFA) 108 (90 BASE) MCG/ACT inhaler Inhale 2 puffs into the lungs every 6 (six) hours as needed for wheezing or shortness of breath.  . Ascorbic Acid (VITAMIN C) 100 MG tablet Take 100 mg by mouth daily.  Marland Kitchen Besifloxacin HCl (BESIVANCE) 0.6 % SUSP Apply to eye. Apply to eye once monthly  . budesonide-formoterol (SYMBICORT) 80-4.5 MCG/ACT inhaler Inhale 2 puffs into the lungs 2 (two) times daily.  . Calcium-Magnesium-Vitamin D (CALCIUM 1200+D3 PO) Take 1 tablet by mouth at  bedtime.  . chlorpheniramine (CHLOR-TRIMETON) 4 MG tablet Take 4 mg by mouth every 4 (four) hours as needed.   Marland Kitchen dextromethorphan (DELSYM) 30 MG/5ML liquid Take 60 mg by mouth 2 (two) times daily as needed for cough.  . famotidine (PEPCID) 20 MG tablet Take 20 mg by mouth at bedtime.   . fenofibrate 160 MG tablet Take 160 mg by mouth daily.  Marland Kitchen FLUoxetine (PROZAC) 20 MG capsule Take 40 mg by mouth daily.   Marland Kitchen gabapentin (NEURONTIN) 100 MG capsule TAKE 1 CAPSULE (100 MG TOTAL) BY MOUTH 3 (THREE) TIMES DAILY. ONE THREE TIMES DAILY  . HYDROcodone-acetaminophen (NORCO) 5-325 MG tablet Take 1 tablet by mouth every 6 (six) hours as needed for moderate pain.  Marland Kitchen irbesartan (AVAPRO) 150 MG tablet TAKE 1 TABLET BY MOUTH EVERY DAY  . losartan (COZAAR) 25 MG tablet Take 50 mg by mouth daily.  . montelukast (SINGULAIR) 10 MG tablet Take 1 tablet (10 mg total) by mouth at bedtime.  . Multiple Vitamins-Minerals (CENTRUM SILVER PO) Take 1 tablet by mouth daily.  Marland Kitchen omeprazole (PRILOSEC) 40 MG capsule Take 40 mg by mouth daily.  Marland Kitchen Respiratory Therapy Supplies (FLUTTER) DEVI Use as directed  . solifenacin (VESICARE) 10 MG tablet Take 10 mg by mouth daily.  . traZODone (DESYREL) 50  MG tablet Take 50 mg by mouth at bedtime.                              Objective:   Physical Exam    amb hoarse obese wf can now breath in s coughing  07/25/2013 197>>196 08/08/2013 > 09/05/2013 198 > 10/13/2013  205 > 11/10/2013  208 > 05/12/2016  214   > 02/23/2017 205 > 07/13/2017  198 >  10/26/2017   200 > 05/03/2018  201 > 08/04/2018   202    Vital signs reviewed - Note on arrival 02 sats  97% on RA and bp 190/100       HEENT: nl dentition, turbinates bilaterally, and oropharynx. Nl external ear canals without cough reflex   NECK :  without JVD/Nodes/TM/ nl carotid upstrokes bilaterally   LUNGS: no acc muscle use,  Nl contour chest which is clear to A and P bilaterally without cough on insp or exp maneuvers   CV:  RRR   no s3 or murmur or increase in P2, and no edema   ABD:  Obese/soft and nontender with nl inspiratory excursion in the supine position. No bruits or organomegaly appreciated, bowel sounds nl  MS:  Nl gait/ ext warm without deformities, calf tenderness, cyanosis or clubbing No obvious joint restrictions   SKIN: warm and dry without lesions    NEURO:  alert, approp, nl sensorium with  no motor or cerebellar deficits apparent.

## 2018-08-04 NOTE — Patient Instructions (Addendum)
Increase your losartan up to 100 mg daily   Symbicort 80 x 2 pffs each am then immediately brush tongue teeth and gargle  See Tammy NP w/in 2 months  with  Your new pillbox and all your medications, even over the counter meds, separated in two separate bags, the ones you take no matter what vs the ones you stop once you feel better and take only as needed when you feel you need them.   Tammy  will generate for you a new user friendly medication calendar that will put [place] all on the same page re: your medication use.     Without this process, it simply isn't possible to assure that we are providing  your outpatient care  with  the attention to detail we feel you deserve.   If we cannot assure that you're getting that kind of care,  then we cannot manage your problem effectively from this clinic.  Once you have seen Tammy and we are sure that we're all on the same page with your medication use she will arrange follow up with me.

## 2018-08-05 ENCOUNTER — Encounter: Payer: Self-pay | Admitting: Internal Medicine

## 2018-08-05 NOTE — Assessment & Plan Note (Signed)
Changed losartan to avapro 02/22/2017 due to cough > back to losartan due to access issues but poor control on 50 mg daily 08/04/2018   rec increase losartan to 100 mg daily but if uacs component worse low threshold to change to alternate arb based on For reasons that may related to vascular permability and nitric oxide pathways but not elevated  bradykinin levels (as seen with  ACEi use) losartan in the generic form has been reported now from mulitple sources  to cause a similar pattern of non-specific  upper airway symptoms as seen with acei.   This has not been reported with exposure to the other ARB's to date, so it seems reasonable for now to try either generic diovan or avapro if ARB needed or use an alternative class altogether.  See:  Lelon Frohlich Allergy Asthma Immunol  2008: 101: p [phone]     I had an extended discussion with the patient reviewing all relevant studies completed to date and  lasting 15 to 20 minutes of a 25 minute visit    See device teaching which extended face to face time for this visit   Each maintenance medication was reviewed in detail including most importantly the difference between maintenance and prns and under what circumstances the prns are to be triggered using an action plan format that is not reflected in the computer generated alphabetically organized AVS but trather by a customized med calendar that reflects the AVS meds with confirmed 100% correlation.   In addition, Please see AVS for unique instructions that I personally wrote and verbalized to the the pt in detail and then reviewed with pt  by my nurse highlighting any  changes in therapy recommended at today's visit to their plan of care.

## 2018-08-05 NOTE — Assessment & Plan Note (Addendum)
Onset in her 25's - Spirometry 02/23/13 wnl  - 07/03/2013 Sinus CT > Mild chronic sinus disease - No acute findings. Left-to-right nasal septal deviation of 3 mm. -med calendar 08/08/13 , redone 06/01/2016 and 02/22/2017  - Eos 4%   10/13/2013  > rec singulair daily  - FENO 05/12/2016  =   24 on singulair  - Allergy profile 05/12/2016 >   IgE  47 pos RAST ragweed /oak/ birch  - 02/22/2017 added flutter valve and d/c'd fosfamax  - PFT's  07/13/2017  FEV1 1.29 (66 % ) ratio 69  p 13 % improvement from saba p nothing prior to study with DLCO  70/73 % corrects to 103 % for alv volume  And ERV 42% - 05/18/17 added symb 80 2bid  - 05/03/2018     75% but caused severe coughing fits  - 05/03/2018 added gabapentin 100 tid trial of uacs component  - 08/04/2018  After extensive coaching inhaler device,  effectiveness =    75% (short ti)   >  try symb 80 just 2 pffs in am   Despite suboptimal hfa, All goals of chronic asthma control met including optimal function and elimination of symptoms with minimal need for rescue therapy.  Contingencies discussed in full including contacting this office immediately if not controlling the symptoms using the rule of two's.     It may be that more of the cough is UACS at this point as evidenced by lack of noct coughing and hoarseness so reasonable to try just qam dosing Based on two studies from NEJM  378; 20 p 1865 (2018) and 380 : p2020-30 (2019) in pts with mild asthma it is reasonable to use low dose symbicort eg 80 2bid "prn" flare in this setting but I emphasized this was only shown with symbicort and takes advantage of the rapid onset of action but is not the same as "rescue therapy" but can be stopped once the acute symptoms have resolved and the need for rescue has been minimized (< 2 x weekly)    F/u with all meds in hand using a trust but verify approach to confirm accurate Medication  Reconciliation The principal here is that until we are certain that the  patients are  doing what we've asked, it makes no sense to ask them to do more. Needs to bring pill box with 4 slots per day to this visit so we can help her correlate meds with med calendar

## 2018-08-09 NOTE — H&P (Signed)
Patient's anticipated LOS is less than 2 midnights, meeting these requirements: - Younger than 66 - Lives within 1 hour of care - Has a competent adult at home to recover with post-op recover - NO history of  - Chronic pain requiring opiods  - Diabetes  - Coronary Artery Disease  - Heart failure  - Heart attack  - Stroke  - DVT/VTE  - Cardiac arrhythmia  - Respiratory Failure/COPD  - Renal failure  - Anemia  - Advanced Liver disease       [name] is an 78 y.o. female.    Chief Complaint: left knee pain  HPI: Pt is a 78 y.o. female complaining of left knee  pain for multiple years. Pain had continually increased since the beginning. X-rays in the clinic show end-stage arthritic changes of the left knee. Pt has tried various conservative treatments which have failed to alleviate their symptoms, including injections and therapy. Various options are discussed with the patient. Risks, benefits and expectations were discussed with the patient. Patient understand the risks, benefits and expectations and wishes to proceed with surgery.   PCP:  Hulan Fess, MD  D/C Plans: Home  PMH: Past Medical History:  Diagnosis Date  . Adenocarcinoma of lung, stage 1 ([place])    Status post right lower lobectomy August 2015  . Anxiety   . Asthma   . Atrophic vaginitis   . Chronic iritis of left eye    Pupil stays dilated; nonreactive  . Colon polyp   . Contact lens/glasses fitting    wears contacts or glasses  . Depression   . Depression with anxiety   . Environmental allergies   . GERD (gastroesophageal reflux disease)   . Hypertriglyceridemia   . Kidney stones   . OA (osteoarthritis)   . Osteopenia   . PONV (postoperative nausea and vomiting)     PSH: Past Surgical History:  Procedure Laterality Date  . APPENDECTOMY    . COLONOSCOPY    . EYE SURGERY Left    cataract removal  . ORIF WRIST FRACTURE Left 08/04/2013   Procedure: OPEN REDUCTION INTERNAL FIXATION  (ORIF) LEFT DISTAL RADIUS WRIST FRACTURE;  Surgeon: Wynonia Sours, MD;  Location: [facility];  Service: Orthopedics;  Laterality: Left;  . PARTIAL HYSTERECTOMY    . VIDEO ASSISTED THORACOSCOPY (VATS)/WEDGE RESECTION Right 02/12/2014   Procedure: RIGHT VIDEO ASSISTED THORACOSCOPY RIGHT LOWER LOBE LUNG /WEDGE RESECTION,RIGHT THORACOTOMY WITH RIGHT LOWER LOBE LOBECTOMY & NODE DISSECTION;  Surgeon: Melrose Nakayama, MD;  Location: [facility];  Service: Thoracic;  Laterality: Right;  Marland Kitchen VIDEO BRONCHOSCOPY N/A 02/12/2014   Procedure: VIDEO BRONCHOSCOPY;  Surgeon: Melrose Nakayama, MD;  Location: [facility];  Service: Thoracic;  Laterality: N/A;    Social History:  reports that she quit smoking about 40 years ago. Her smoking use included cigarettes. She has a 15.00 pack-year smoking history. She has never used smokeless tobacco. She reports current alcohol use of about 1.0 standard drinks of alcohol per week. She reports that she does not use drugs.  Allergies:  Allergies  Allergen Reactions  . Grass Extracts [Gramineae Pollens]   . Nsaids Other (See Comments)    Elevated kidney fx  . Prednisone Nausea And Vomiting    Can take Prednisone injection but not orally    Medications: No current facility-administered medications for this encounter.    Current Outpatient Medications  Medication Sig Dispense Refill  . albuterol (PROAIR HFA) 108 (90 BASE) MCG/ACT inhaler Inhale 2  puffs into the lungs every 6 (six) hours as needed for wheezing or shortness of breath. 1 Inhaler 4  . Ascorbic Acid (VITAMIN C) 100 MG tablet Take 100 mg by mouth daily.    Marland Kitchen Besifloxacin HCl (BESIVANCE) 0.6 % SUSP Apply to eye. Apply to eye once monthly    . budesonide-formoterol (SYMBICORT) 80-4.5 MCG/ACT inhaler Inhale 2 puffs into the lungs 2 (two) times daily. 1 Inhaler 5  . Calcium-Magnesium-Vitamin D (CALCIUM 1200+D3 PO) Take 1 tablet by mouth at bedtime.    . chlorpheniramine (CHLOR-TRIMETON) 4 MG tablet Take  4 mg by mouth every 4 (four) hours as needed.     Marland Kitchen dextromethorphan (DELSYM) 30 MG/5ML liquid Take 60 mg by mouth 2 (two) times daily as needed for cough.    . famotidine (PEPCID) 20 MG tablet Take 20 mg by mouth at bedtime.     . fenofibrate 160 MG tablet Take 160 mg by mouth daily.    Marland Kitchen FLUoxetine (PROZAC) 20 MG capsule Take 40 mg by mouth daily.     Marland Kitchen gabapentin (NEURONTIN) 100 MG capsule TAKE 1 CAPSULE (100 MG TOTAL) BY MOUTH 3 (THREE) TIMES DAILY. ONE THREE TIMES DAILY 270 capsule 1  . HYDROcodone-acetaminophen (NORCO) 5-325 MG tablet Take 1 tablet by mouth every 6 (six) hours as needed for moderate pain. 30 tablet 0  . losartan (COZAAR) 100 MG tablet Take 1 tablet (100 mg total) by mouth daily. 30 tablet 11  . montelukast (SINGULAIR) 10 MG tablet Take 1 tablet (10 mg total) by mouth at bedtime. 90 tablet 0  . Multiple Vitamins-Minerals (CENTRUM SILVER PO) Take 1 tablet by mouth daily.    Marland Kitchen omeprazole (PRILOSEC) 40 MG capsule Take 40 mg by mouth daily.    Marland Kitchen Respiratory Therapy Supplies (FLUTTER) DEVI Use as directed 1 each 0  . solifenacin (VESICARE) 10 MG tablet Take 10 mg by mouth daily.    . traZODone (DESYREL) 50 MG tablet Take 50 mg by mouth at bedtime.      No results found for this or any previous visit (from the past 48 hour(s)). No results found.  ROS: Pain with rom of the left lower extremity  Physical Exam: Alert and oriented 78 y.o. female in no acute distress Cranial nerves 2-12 intact Cervical spine: full rom with no tenderness, nv intact distally Chest: active breath sounds bilaterally, no wheeze rhonchi or rales Heart: regular rate and rhythm, no murmur Abd: non tender non distended with active bowel sounds Hip is stable with rom  Left knee medial and lateral joint line tenderness  Crepitus with rom nv intact distally Antalgic gait  Assessment/Plan Assessment: left knee end stage osteoarthritis  Plan:  Patient will undergo a left total knee by Dr. Veverly Fells  at [facility]. Risks benefits and expectations were discussed with the patient. Patient understand risks, benefits and expectations and wishes to proceed. Preoperative templating of the joint replacement has been completed, documented, and submitted to the Operating Room personnel in order to optimize intra-operative equipment management.   Merla Riches PA-C, MPAS [facility] is now [employer] [address] [place], [place], [place] [address] Phone: [phone] [email or web address] Facebook  [employer]

## 2018-08-17 NOTE — Patient Instructions (Addendum)
[name]  08/17/2018   Your procedure is scheduled on: Friday 08/26/2018  Report to [facility] Main  Entrance              Report to admitting at   [place] AM    Call this number if you have problems the morning of surgery [phone]    Remember: Do not eat food or drink liquids :After Midnight.               BRUSH YOUR TEETH MORNING OF SURGERY AND RINSE YOUR MOUTH OUT, NO CHEWING GUM CANDY OR MINTS.     Take these medicines the morning of surgery with A SIP OF WATER: Amlodipine (Norvasc) Fluoxetine (Prozac), use eye drops, Gabapentin (Neurontin),Omeprazole (Prilosec),usa Albuterol inhaler if needed, use Symbicort inhaler   and bring inhalers  With you to the hospital.                                 You may not have any metal on your body including hair pins and              piercings  Do not wear jewelry, make-up, lotions, powders or perfumes, deodorant             Do not wear nail polish.  Do not shave  48 hours prior to surgery.             Do not bring valuables to the hospital. [place].  Contacts, dentures or bridgework may not be worn into surgery.  Leave suitcase in the car. After surgery it may be brought to your room.                Please read over the following fact sheets you were given: _____________________________________________________________________             [facility] - Preparing for Surgery Before surgery, you can play an important role.  Because skin is not sterile, your skin needs to be as free of germs as possible.  You can reduce the number of germs on your skin by washing with CHG (chlorahexidine gluconate) soap before surgery.  CHG is an antiseptic cleaner which kills germs and bonds with the skin to continue killing germs even after washing. Please DO NOT use if you have an allergy to CHG or antibacterial soaps.  If your skin becomes reddened/irritated stop using  the CHG and inform your nurse when you arrive at Short Stay. Do not shave (including legs and underarms) for at least 48 hours prior to the first CHG shower.  You may shave your face/neck. Please follow these instructions carefully:  1.  Shower with CHG Soap the night before surgery and the  morning of Surgery.  2.  If you choose to wash your hair, wash your hair first as usual with your  normal  shampoo.  3.  After you shampoo, rinse your hair and body thoroughly to remove the  shampoo.                           4.  Use CHG as you would any other liquid soap.  You can apply chg directly  to the  skin and wash                       Gently with a scrungie or clean washcloth.  5.  Apply the CHG Soap to your body ONLY FROM THE NECK DOWN.   Do not use on face/ open                           Wound or open sores. Avoid contact with eyes, ears mouth and genitals (private parts).                       Wash face,  Genitals (private parts) with your normal soap.             6.  Wash thoroughly, paying special attention to the area where your surgery  will be performed.  7.  Thoroughly rinse your body with warm water from the neck down.  8.  DO NOT shower/wash with your normal soap after using and rinsing off  the CHG Soap.                9.  Pat yourself dry with a clean towel.            10.  Wear clean pajamas.            11.  Place clean sheets on your bed the night of your first shower and do not  sleep with pets. Day of Surgery : Do not apply any lotions/deodorants the morning of surgery.  Please wear clean clothes to the hospital/surgery center.  FAILURE TO FOLLOW THESE INSTRUCTIONS MAY RESULT IN THE CANCELLATION OF YOUR SURGERY PATIENT SIGNATURE_________________________________  NURSE SIGNATURE__________________________________  ________________________________________________________________________   Adam Phenix  An incentive spirometer is a tool that can help keep your lungs  clear and active. This tool measures how well you are filling your lungs with each breath. Taking long deep breaths may help reverse or decrease the chance of developing breathing (pulmonary) problems (especially infection) following:  A long period of time when you are unable to move or be active. BEFORE THE PROCEDURE   If the spirometer includes an indicator to show your best effort, your nurse or respiratory therapist will set it to a desired goal.  If possible, sit up straight or lean slightly forward. Try not to slouch.  Hold the incentive spirometer in an upright position. INSTRUCTIONS FOR USE  1. Sit on the edge of your bed if possible, or sit up as far as you can in bed or on a chair. 2. Hold the incentive spirometer in an upright position. 3. Breathe out normally. 4. Place the mouthpiece in your mouth and seal your lips tightly around it. 5. Breathe in slowly and as deeply as possible, raising the piston or the ball toward the top of the column. 6. Hold your breath for 3-5 seconds or for as long as possible. Allow the piston or ball to fall to the bottom of the column. 7. Remove the mouthpiece from your mouth and breathe out normally. 8. Rest for a few seconds and repeat Steps 1 through 7 at least 10 times every 1-2 hours when you are awake. Take your time and take a few normal breaths between deep breaths. 9. The spirometer may include an indicator to show your best effort. Use the indicator as a goal to work toward during each repetition. 10. After each set of  10 deep breaths, practice coughing to be sure your lungs are clear. If you have an incision (the cut made at the time of surgery), support your incision when coughing by placing a pillow or rolled up towels firmly against it. Once you are able to get out of bed, walk around indoors and cough well. You may stop using the incentive spirometer when instructed by your caregiver.  RISKS AND COMPLICATIONS  Take your time so you do  not get dizzy or light-headed.  If you are in pain, you may need to take or ask for pain medication before doing incentive spirometry. It is harder to take a deep breath if you are having pain. AFTER USE  Rest and breathe slowly and easily.  It can be helpful to keep track of a log of your progress. Your caregiver can provide you with a simple table to help with this. If you are using the spirometer at home, follow these instructions: [place] IF:   You are having difficultly using the spirometer.  You have trouble using the spirometer as often as instructed.  Your pain medication is not giving enough relief while using the spirometer.  You develop fever of 100.5 F (38.1 C) or higher. SEEK IMMEDIATE MEDICAL CARE IF:   You cough up bloody sputum that had not been present before.  You develop fever of 102 F (38.9 C) or greater.  You develop worsening pain at or near the incision site. MAKE SURE YOU:   Understand these instructions.  Will watch your condition.  Will get help right away if you are not doing well or get worse. Document Released: 11/02/2006 Document Revised: 09/14/2011 Document Reviewed: 01/03/2007 [facility] Patient Information 2014 [place], [place].   ________________________________________________________________________

## 2018-08-19 ENCOUNTER — Encounter (HOSPITAL_COMMUNITY): Payer: Self-pay

## 2018-08-19 ENCOUNTER — Other Ambulatory Visit: Payer: Self-pay

## 2018-08-19 ENCOUNTER — Encounter (HOSPITAL_COMMUNITY)
Admission: RE | Admit: 2018-08-19 | Discharge: 2018-08-19 | Disposition: A | Discharge status: Home / Self Care | Payer: Medicare Other | Source: Ambulatory Visit | Attending: Orthopedic Surgery | Admitting: Orthopedic Surgery

## 2018-08-19 DIAGNOSIS — Z01812 Encounter for preprocedural laboratory examination: Secondary | ICD-10-CM | POA: Diagnosis not present

## 2018-08-19 LAB — CBC
HCT: 42.5 % (ref 36.0–46.0)
Hemoglobin: 13.4 g/dL (ref 12.0–15.0)
MCH: 31.2 pg (ref 26.0–34.0)
MCHC: 31.5 g/dL (ref 30.0–36.0)
MCV: 99.1 fL (ref 80.0–100.0)
Platelets: 301 10*3/uL (ref 150–400)
RBC: 4.29 MIL/uL (ref 3.87–5.11)
RDW: 13.1 % (ref 11.5–15.5)
WBC: 9.2 10*3/uL (ref 4.0–10.5)
nRBC: 0 % (ref 0.0–0.2)

## 2018-08-19 LAB — BASIC METABOLIC PANEL
Anion gap: 7 (ref 5–15)
BUN: 16 mg/dL (ref 8–23)
CO2: 27 mmol/L (ref 22–32)
Calcium: 9.8 mg/dL (ref 8.9–10.3)
Chloride: 103 mmol/L (ref 98–111)
Creatinine, Ser: 1.16 mg/dL — ABNORMAL HIGH (ref 0.44–1.00)
GFR calc Af Amer: 52 mL/min — ABNORMAL LOW (ref >60)
GFR calc non Af Amer: 45 mL/min — ABNORMAL LOW (ref >60)
Glucose, Bld: 95 mg/dL (ref 70–99)
Potassium: 4.7 mmol/L (ref 3.5–5.1)
Sodium: 137 mmol/L (ref 135–145)

## 2018-08-19 LAB — SURGICAL PCR SCREEN
MRSA, PCR: NEGATIVE
Staphylococcus aureus: NEGATIVE

## 2018-08-23 ENCOUNTER — Other Ambulatory Visit: Payer: Self-pay | Admitting: Family Medicine

## 2018-08-23 DIAGNOSIS — I10 Essential (primary) hypertension: Secondary | ICD-10-CM

## 2018-08-26 ENCOUNTER — Encounter (HOSPITAL_COMMUNITY)
Admission: AD | Disposition: A | Discharge status: Home / Self Care | Payer: Self-pay | Source: Home / Self Care | Attending: Orthopedic Surgery

## 2018-08-26 ENCOUNTER — Encounter (HOSPITAL_COMMUNITY): Payer: Self-pay | Admitting: Anesthesiology

## 2018-08-26 ENCOUNTER — Ambulatory Visit (HOSPITAL_COMMUNITY): Payer: Medicare Other | Admitting: Physician Assistant

## 2018-08-26 ENCOUNTER — Ambulatory Visit (HOSPITAL_COMMUNITY): Payer: Medicare Other | Admitting: Anesthesiology

## 2018-08-26 ENCOUNTER — Other Ambulatory Visit: Payer: Self-pay

## 2018-08-26 ENCOUNTER — Observation Stay (HOSPITAL_COMMUNITY)
Admission: AD | Admit: 2018-08-26 | Discharge: 2018-08-29 | Disposition: A | Discharge status: Home / Self Care | Payer: Medicare Other | Attending: Orthopedic Surgery | Admitting: Orthopedic Surgery

## 2018-08-26 DIAGNOSIS — E669 Obesity, unspecified: Secondary | ICD-10-CM | POA: Insufficient documentation

## 2018-08-26 DIAGNOSIS — Z888 Allergy status to other drugs, medicaments and biological substances status: Secondary | ICD-10-CM | POA: Diagnosis not present

## 2018-08-26 DIAGNOSIS — Z87891 Personal history of nicotine dependence: Secondary | ICD-10-CM | POA: Insufficient documentation

## 2018-08-26 DIAGNOSIS — F418 Other specified anxiety disorders: Secondary | ICD-10-CM | POA: Diagnosis not present

## 2018-08-26 DIAGNOSIS — Z79899 Other long term (current) drug therapy: Secondary | ICD-10-CM | POA: Insufficient documentation

## 2018-08-26 DIAGNOSIS — K219 Gastro-esophageal reflux disease without esophagitis: Secondary | ICD-10-CM | POA: Diagnosis not present

## 2018-08-26 DIAGNOSIS — Z96652 Presence of left artificial knee joint: Secondary | ICD-10-CM

## 2018-08-26 DIAGNOSIS — Z886 Allergy status to analgesic agent status: Secondary | ICD-10-CM | POA: Insufficient documentation

## 2018-08-26 DIAGNOSIS — J45909 Unspecified asthma, uncomplicated: Secondary | ICD-10-CM | POA: Diagnosis not present

## 2018-08-26 DIAGNOSIS — E781 Pure hyperglyceridemia: Secondary | ICD-10-CM | POA: Insufficient documentation

## 2018-08-26 DIAGNOSIS — M1712 Unilateral primary osteoarthritis, left knee: Secondary | ICD-10-CM | POA: Diagnosis not present

## 2018-08-26 DIAGNOSIS — Z6835 Body mass index (BMI) 35.0-35.9, adult: Secondary | ICD-10-CM | POA: Diagnosis not present

## 2018-08-26 DIAGNOSIS — Z7951 Long term (current) use of inhaled steroids: Secondary | ICD-10-CM | POA: Diagnosis not present

## 2018-08-26 DIAGNOSIS — Z96659 Presence of unspecified artificial knee joint: Secondary | ICD-10-CM

## 2018-08-26 HISTORY — PX: TOTAL KNEE ARTHROPLASTY: SHX125

## 2018-08-26 HISTORY — DX: Presence of left artificial knee joint: Z96.652

## 2018-08-26 SURGERY — ARTHROPLASTY, KNEE, TOTAL
Anesthesia: Spinal | Site: Knee | Laterality: Left

## 2018-08-26 MED ORDER — MENTHOL 3 MG MT LOZG: 1.0000 | LOZENGE | OROMUCOSAL | Status: DC | PRN

## 2018-08-26 MED ORDER — MIDAZOLAM HCL 2 MG/2ML IJ SOLN: 1.0000 mg | INTRAMUSCULAR | Status: DC

## 2018-08-26 MED ORDER — PROPOFOL 10 MG/ML IV BOLUS
INTRAVENOUS | Status: AC
  Filled 2018-08-26: qty 20

## 2018-08-26 MED ORDER — ONDANSETRON HCL 4 MG/2ML IJ SOLN: 4.0000 mg | Freq: Once | INTRAMUSCULAR | Status: DC | PRN

## 2018-08-26 MED ORDER — ASPIRIN 81 MG PO CHEW
81.0000 mg | CHEWABLE_TABLET | Freq: Two times a day (BID) | ORAL | Status: DC
  Administered 2018-08-26 – 2018-08-29 (×6): 81 mg via ORAL
  Filled 2018-08-26 (×6): qty 1

## 2018-08-26 MED ORDER — FENTANYL CITRATE (PF) 100 MCG/2ML IJ SOLN
50.0000 ug | INTRAMUSCULAR | Status: DC
  Administered 2018-08-26 (×2): 50 ug via INTRAVENOUS

## 2018-08-26 MED ORDER — OXYCODONE-ACETAMINOPHEN 5-325 MG PO TABS: 1.0000 | ORAL_TABLET | ORAL | 0 refills | Status: DC | PRN

## 2018-08-26 MED ORDER — 0.9 % SODIUM CHLORIDE (POUR BTL) OPTIME
TOPICAL | Status: DC | PRN
  Administered 2018-08-26: 1000 mL

## 2018-08-26 MED ORDER — PROPOFOL 500 MG/50ML IV EMUL
INTRAVENOUS | Status: DC | PRN
  Administered 2018-08-26: 30 mg via INTRAVENOUS

## 2018-08-26 MED ORDER — DARIFENACIN HYDROBROMIDE ER 15 MG PO TB24
15.0000 mg | ORAL_TABLET | Freq: Every day | ORAL | Status: DC
  Administered 2018-08-27 – 2018-08-29 (×3): 15 mg via ORAL
  Filled 2018-08-26 (×3): qty 1

## 2018-08-26 MED ORDER — LACTATED RINGERS IV SOLN
INTRAVENOUS | Status: DC
  Administered 2018-08-26 (×2): via INTRAVENOUS

## 2018-08-26 MED ORDER — FENTANYL CITRATE (PF) 100 MCG/2ML IJ SOLN
INTRAMUSCULAR | Status: DC | PRN
  Administered 2018-08-26: 100 ug via INTRAVENOUS

## 2018-08-26 MED ORDER — ONDANSETRON HCL 4 MG/2ML IJ SOLN
INTRAMUSCULAR | Status: DC | PRN
  Administered 2018-08-26: 4 mg via INTRAVENOUS

## 2018-08-26 MED ORDER — TRANEXAMIC ACID-NACL 1000-0.7 MG/100ML-% IV SOLN
1000.0000 mg | INTRAVENOUS | Status: AC
Start: 2018-08-26 — End: 2018-08-26
  Administered 2018-08-26: 1000 mg via INTRAVENOUS
  Filled 2018-08-26: qty 100

## 2018-08-26 MED ORDER — FLUOXETINE HCL 20 MG PO CAPS
40.0000 mg | ORAL_CAPSULE | Freq: Every day | ORAL | Status: DC
  Administered 2018-08-27 – 2018-08-29 (×3): 40 mg via ORAL
  Filled 2018-08-26 (×3): qty 2

## 2018-08-26 MED ORDER — ALBUTEROL SULFATE (2.5 MG/3ML) 0.083% IN NEBU: 3.0000 mL | INHALATION_SOLUTION | Freq: Four times a day (QID) | RESPIRATORY_TRACT | Status: DC | PRN

## 2018-08-26 MED ORDER — DIPHENHYDRAMINE HCL 25 MG PO CAPS
25.0000 mg | ORAL_CAPSULE | Freq: Four times a day (QID) | ORAL | Status: DC
  Filled 2018-08-26 (×3): qty 1

## 2018-08-26 MED ORDER — STERILE WATER FOR IRRIGATION IR SOLN
Status: DC | PRN
  Administered 2018-08-26: 2000 mL

## 2018-08-26 MED ORDER — PROPOFOL 500 MG/50ML IV EMUL
INTRAVENOUS | Status: DC | PRN
  Administered 2018-08-26: 75 ug/kg/min via INTRAVENOUS

## 2018-08-26 MED ORDER — PROPOFOL 10 MG/ML IV BOLUS
INTRAVENOUS | Status: AC
  Filled 2018-08-26: qty 40

## 2018-08-26 MED ORDER — SODIUM CHLORIDE 0.9 % IR SOLN
Status: DC | PRN
  Administered 2018-08-26: 1000 mL

## 2018-08-26 MED ORDER — DEXAMETHASONE SODIUM PHOSPHATE 10 MG/ML IJ SOLN
INTRAMUSCULAR | Status: DC | PRN
  Administered 2018-08-26: 5 mg via INTRAVENOUS

## 2018-08-26 MED ORDER — GABAPENTIN 100 MG PO CAPS
100.0000 mg | ORAL_CAPSULE | Freq: Three times a day (TID) | ORAL | Status: DC
  Administered 2018-08-26 – 2018-08-29 (×9): 100 mg via ORAL
  Filled 2018-08-26 (×9): qty 1

## 2018-08-26 MED ORDER — TRAZODONE HCL 50 MG PO TABS
50.0000 mg | ORAL_TABLET | Freq: Every day | ORAL | Status: DC
  Administered 2018-08-26 – 2018-08-28 (×3): 50 mg via ORAL
  Filled 2018-08-26 (×3): qty 1

## 2018-08-26 MED ORDER — PHENOL 1.4 % MT LIQD: 1.0000 | OROMUCOSAL | Status: DC | PRN

## 2018-08-26 MED ORDER — AMLODIPINE BESYLATE 5 MG PO TABS
5.0000 mg | ORAL_TABLET | Freq: Every day | ORAL | Status: DC
  Administered 2018-08-27 – 2018-08-29 (×3): 5 mg via ORAL
  Filled 2018-08-26 (×3): qty 1

## 2018-08-26 MED ORDER — DORZOLAMIDE HCL-TIMOLOL MAL 2-0.5 % OP SOLN
1.0000 [drp] | Freq: Two times a day (BID) | OPHTHALMIC | Status: DC
  Administered 2018-08-26 – 2018-08-29 (×6): 1 [drp] via OPHTHALMIC
  Filled 2018-08-26: qty 10

## 2018-08-26 MED ORDER — SODIUM CHLORIDE 0.9 % IV SOLN
INTRAVENOUS | Status: DC
  Administered 2018-08-26 (×2): via INTRAVENOUS

## 2018-08-26 MED ORDER — TRANEXAMIC ACID-NACL 1000-0.7 MG/100ML-% IV SOLN
1000.0000 mg | Freq: Once | INTRAVENOUS | Status: AC
  Administered 2018-08-26: 1000 mg via INTRAVENOUS
  Filled 2018-08-26: qty 100

## 2018-08-26 MED ORDER — FENTANYL CITRATE (PF) 100 MCG/2ML IJ SOLN
INTRAMUSCULAR | Status: AC
  Filled 2018-08-26: qty 2

## 2018-08-26 MED ORDER — PANTOPRAZOLE SODIUM 40 MG PO TBEC
40.0000 mg | DELAYED_RELEASE_TABLET | Freq: Every day | ORAL | Status: DC
  Administered 2018-08-27 – 2018-08-29 (×3): 40 mg via ORAL
  Filled 2018-08-26 (×3): qty 1

## 2018-08-26 MED ORDER — MOMETASONE FURO-FORMOTEROL FUM 100-5 MCG/ACT IN AERO
2.0000 | INHALATION_SPRAY | Freq: Two times a day (BID) | RESPIRATORY_TRACT | Status: DC
  Filled 2018-08-26: qty 8.8

## 2018-08-26 MED ORDER — METOCLOPRAMIDE HCL 5 MG PO TABS: 5.0000 mg | ORAL_TABLET | Freq: Three times a day (TID) | ORAL | Status: DC | PRN

## 2018-08-26 MED ORDER — METHOCARBAMOL 500 MG IVPB - SIMPLE MED
500.0000 mg | Freq: Four times a day (QID) | INTRAVENOUS | Status: DC | PRN
  Filled 2018-08-26: qty 50

## 2018-08-26 MED ORDER — LOSARTAN POTASSIUM 50 MG PO TABS
100.0000 mg | ORAL_TABLET | Freq: Every day | ORAL | Status: DC
  Administered 2018-08-27 – 2018-08-29 (×3): 100 mg via ORAL
  Filled 2018-08-26 (×3): qty 2

## 2018-08-26 MED ORDER — POLYETHYLENE GLYCOL 3350 17 G PO PACK: 17.0000 g | PACK | Freq: Every day | ORAL | Status: DC | PRN

## 2018-08-26 MED ORDER — ASPIRIN 81 MG PO CHEW: 81.0000 mg | CHEWABLE_TABLET | Freq: Two times a day (BID) | ORAL | 0 refills | Status: AC

## 2018-08-26 MED ORDER — ADULT MULTIVITAMIN W/MINERALS CH
ORAL_TABLET | Freq: Every day | ORAL | Status: DC
  Administered 2018-08-27 – 2018-08-29 (×3): 1 via ORAL
  Filled 2018-08-26 (×4): qty 1

## 2018-08-26 MED ORDER — FENTANYL CITRATE (PF) 100 MCG/2ML IJ SOLN: 25.0000 ug | INTRAMUSCULAR | Status: DC | PRN

## 2018-08-26 MED ORDER — HYDROMORPHONE HCL 1 MG/ML IJ SOLN: 0.5000 mg | INTRAMUSCULAR | Status: DC | PRN

## 2018-08-26 MED ORDER — BUPIVACAINE-EPINEPHRINE (PF) 0.5% -1:200000 IJ SOLN
INTRAMUSCULAR | Status: DC | PRN
  Administered 2018-08-26: 30 mL via PERINEURAL

## 2018-08-26 MED ORDER — ONDANSETRON HCL 4 MG PO TABS: 4.0000 mg | ORAL_TABLET | Freq: Four times a day (QID) | ORAL | Status: DC | PRN

## 2018-08-26 MED ORDER — ACETAMINOPHEN 325 MG PO TABS: 325.0000 mg | ORAL_TABLET | Freq: Four times a day (QID) | ORAL | Status: DC | PRN

## 2018-08-26 MED ORDER — METHOCARBAMOL 500 MG PO TABS
500.0000 mg | ORAL_TABLET | Freq: Four times a day (QID) | ORAL | Status: DC | PRN
  Administered 2018-08-27 – 2018-08-28 (×3): 500 mg via ORAL
  Filled 2018-08-26 (×4): qty 1

## 2018-08-26 MED ORDER — FENOFIBRATE 160 MG PO TABS
160.0000 mg | ORAL_TABLET | Freq: Every day | ORAL | Status: DC
  Administered 2018-08-27 – 2018-08-29 (×3): 160 mg via ORAL
  Filled 2018-08-26 (×3): qty 1

## 2018-08-26 MED ORDER — VITAMIN C 100 MG PO TABS: 100.0000 mg | ORAL_TABLET | Freq: Every day | ORAL | Status: DC

## 2018-08-26 MED ORDER — DOCUSATE SODIUM 100 MG PO CAPS
100.0000 mg | ORAL_CAPSULE | Freq: Two times a day (BID) | ORAL | Status: DC
  Administered 2018-08-26 – 2018-08-29 (×6): 100 mg via ORAL
  Filled 2018-08-26 (×7): qty 1

## 2018-08-26 MED ORDER — CHLORHEXIDINE GLUCONATE 4 % EX LIQD: 60.0000 mL | Freq: Once | CUTANEOUS | Status: DC

## 2018-08-26 MED ORDER — MONTELUKAST SODIUM 10 MG PO TABS
10.0000 mg | ORAL_TABLET | Freq: Every day | ORAL | Status: DC
  Administered 2018-08-26 – 2018-08-28 (×3): 10 mg via ORAL
  Filled 2018-08-26 (×3): qty 1

## 2018-08-26 MED ORDER — CEFAZOLIN SODIUM-DEXTROSE 2-4 GM/100ML-% IV SOLN
2.0000 g | INTRAVENOUS | Status: AC
  Administered 2018-08-26: 2 g via INTRAVENOUS
  Filled 2018-08-26: qty 100

## 2018-08-26 MED ORDER — METOCLOPRAMIDE HCL 5 MG/ML IJ SOLN: 5.0000 mg | Freq: Three times a day (TID) | INTRAMUSCULAR | Status: DC | PRN

## 2018-08-26 MED ORDER — MEPERIDINE HCL 50 MG/ML IJ SOLN: 6.2500 mg | INTRAMUSCULAR | Status: DC | PRN

## 2018-08-26 MED ORDER — BUPIVACAINE HCL (PF) 0.75 % IJ SOLN
INTRAMUSCULAR | Status: DC | PRN
  Administered 2018-08-26: 1.6 mL via INTRATHECAL

## 2018-08-26 MED ORDER — PHENYLEPHRINE HCL 10 MG/ML IJ SOLN
INTRAMUSCULAR | Status: DC | PRN
  Administered 2018-08-26: 80 ug via INTRAVENOUS
  Administered 2018-08-26: 120 ug via INTRAVENOUS
  Administered 2018-08-26: 80 ug via INTRAVENOUS

## 2018-08-26 MED ORDER — METHOCARBAMOL 500 MG PO TABS: 500.0000 mg | ORAL_TABLET | Freq: Three times a day (TID) | ORAL | 1 refills | Status: DC | PRN

## 2018-08-26 MED ORDER — ONDANSETRON HCL 4 MG/2ML IJ SOLN: 4.0000 mg | Freq: Four times a day (QID) | INTRAMUSCULAR | Status: DC | PRN

## 2018-08-26 MED ORDER — FAMOTIDINE 20 MG PO TABS
20.0000 mg | ORAL_TABLET | Freq: Every day | ORAL | Status: DC
  Administered 2018-08-26 – 2018-08-28 (×3): 20 mg via ORAL
  Filled 2018-08-26 (×3): qty 1

## 2018-08-26 MED ORDER — OXYCODONE HCL 5 MG PO TABS
5.0000 mg | ORAL_TABLET | ORAL | Status: DC | PRN
  Administered 2018-08-26 (×2): 10 mg via ORAL
  Administered 2018-08-26 (×2): 5 mg via ORAL
  Administered 2018-08-27 (×3): 10 mg via ORAL
  Administered 2018-08-28: 5 mg via ORAL
  Administered 2018-08-28 (×2): 10 mg via ORAL
  Administered 2018-08-29: 5 mg via ORAL
  Filled 2018-08-26: qty 1
  Filled 2018-08-26 (×3): qty 2
  Filled 2018-08-26: qty 1
  Filled 2018-08-26 (×5): qty 2
  Filled 2018-08-26: qty 1

## 2018-08-26 MED ORDER — CEFAZOLIN SODIUM-DEXTROSE 2-4 GM/100ML-% IV SOLN
2.0000 g | Freq: Four times a day (QID) | INTRAVENOUS | Status: AC
  Administered 2018-08-26 (×2): 2 g via INTRAVENOUS
  Filled 2018-08-26 (×2): qty 100

## 2018-08-26 SURGICAL SUPPLY — 53 items
BAG SPEC THK2 15X12 ZIP CLS (MISCELLANEOUS) ×1
BAG ZIPLOCK 12X15 (MISCELLANEOUS) ×2 IMPLANT
BLADE SAG 18X100X1.27 (BLADE) ×2 IMPLANT
BLADE SAW SGTL 13X75X1.27 (BLADE) ×2 IMPLANT
BLADE SURG SZ10 CARB STEEL (BLADE) ×4 IMPLANT
BNDG CMPR MED 10X6 ELC LF (GAUZE/BANDAGES/DRESSINGS) ×1
BNDG ELASTIC 6X10 VLCR STRL LF (GAUZE/BANDAGES/DRESSINGS) ×2 IMPLANT
BNDG GAUZE ELAST 4 BULKY (GAUZE/BANDAGES/DRESSINGS) ×2 IMPLANT
BOWL SMART MIX CTS (DISPOSABLE) ×2 IMPLANT
CEMENT HV SMART SET (Cement) ×4 IMPLANT
CEMENT TIBIA MBT (Knees) ×1 IMPLANT
COVER SURGICAL LIGHT HANDLE (MISCELLANEOUS) ×2 IMPLANT
COVER WAND RF STERILE (DRAPES) IMPLANT
CUFF TOURN SGL QUICK 34 (TOURNIQUET CUFF) ×1
CUFF TOURN SGL QUICK 42 (TOURNIQUET CUFF) ×2 IMPLANT
CUFF TRNQT CYL 34X4.125X (TOURNIQUET CUFF) ×1 IMPLANT
DRAPE U-SHAPE 47X51 STRL (DRAPES) ×2 IMPLANT
DRSG ADAPTIC 3X8 NADH LF (GAUZE/BANDAGES/DRESSINGS) ×2 IMPLANT
DRSG PAD ABDOMINAL 8X10 ST (GAUZE/BANDAGES/DRESSINGS) ×2 IMPLANT
DURAPREP 26ML APPLICATOR (WOUND CARE) ×2 IMPLANT
ELECT REM PT RETURN 15FT ADLT (MISCELLANEOUS) ×2 IMPLANT
GAUZE SPONGE 4X4 12PLY STRL (GAUZE/BANDAGES/DRESSINGS) ×2 IMPLANT
GLOVE ORTHO TXT STRL SZ7.5 (GLOVE) ×2 IMPLANT
GLOVE SURG ORTHO 8.5 STRL (GLOVE) ×4 IMPLANT
GOWN STRL REUS W/ TWL XL LVL3 (GOWN DISPOSABLE) ×2 IMPLANT
GOWN STRL REUS W/TWL XL LVL3 (GOWN DISPOSABLE) ×4
HANDPIECE INTERPULSE COAX TIP (DISPOSABLE) ×2
HOLDER FOLEY CATH W/STRAP (MISCELLANEOUS) IMPLANT
IMMOBILIZER KNEE 20 (SOFTGOODS) ×2 IMPLANT
IMMOBILIZER KNEE 20 THIGH 36 (SOFTGOODS) IMPLANT
IMPL FEMUR SIGMA LT PS SZ 3 (Knees) ×1 IMPLANT
IMPLANT FEMUR SIGMA LT PS SZ 3 (Knees) ×2 IMPLANT
INSERT TIBIAL PFC 3 20.0 (Knees) ×2 IMPLANT
MANIFOLD NEPTUNE II (INSTRUMENTS) ×2 IMPLANT
NS IRRIG 1000ML POUR BTL (IV SOLUTION) ×2 IMPLANT
PACK TOTAL KNEE CUSTOM (KITS) ×2 IMPLANT
PATELLA DOME PFC 38MM (Knees) ×2 IMPLANT
PIN STEINMAN FIXATION KNEE (PIN) ×2 IMPLANT
PROTECTOR NERVE ULNAR (MISCELLANEOUS) ×2 IMPLANT
SET HNDPC FAN SPRY TIP SCT (DISPOSABLE) ×1 IMPLANT
STAPLER VISISTAT 35W (STAPLE) IMPLANT
STRIP CLOSURE SKIN 1/2X4 (GAUZE/BANDAGES/DRESSINGS) ×2 IMPLANT
SUT MNCRL AB 3-0 PS2 18 (SUTURE) ×2 IMPLANT
SUT VIC AB 0 CT1 27 (SUTURE) ×2
SUT VIC AB 0 CT1 27XBRD ANTBC (SUTURE) ×1 IMPLANT
SUT VIC AB 1 CT1 27 (SUTURE) ×2
SUT VIC AB 1 CT1 27XBRD ANTBC (SUTURE) ×2 IMPLANT
SUT VIC AB 2-0 CT1 27 (SUTURE) ×6
SUT VIC AB 2-0 CT1 TAPERPNT 27 (SUTURE) ×3 IMPLANT
TIBIA MBT CEMENT (Knees) ×2 IMPLANT
TRAY FOLEY MTR SLVR 16FR STAT (SET/KITS/TRAYS/PACK) ×2 IMPLANT
WATER STERILE IRR 1000ML POUR (IV SOLUTION) ×4 IMPLANT
WRAP KNEE MAXI GEL POST OP (GAUZE/BANDAGES/DRESSINGS) ×2 IMPLANT

## 2018-08-26 NOTE — Progress Notes (Signed)
PHARMACIST - PHYSICIAN ORDER COMMUNICATION  CONCERNING: P&T Medication Policy on Herbal Medications  DESCRIPTION:  This patient's order for:  Vitamin C 100mg   has been noted.  The smallest Vitamin C tablet available in the Health System is 250mg  Unable to supply Vitamin C 100mg    ACTION TAKEN: The pharmacy department is unable to verify this order at this time  Minda Ditto PharmD Pager [phone] 08/26/2018, 1:28 PM

## 2018-08-26 NOTE — Brief Op Note (Signed)
08/26/2018  11:51 AM  PATIENT:  [name]  78 y.o. female  PRE-OPERATIVE DIAGNOSIS:  Left knee end-stage osteoarthritis  POST-OPERATIVE DIAGNOSIS:  Left knee end-stage osteoarthritis  PROCEDURE:  Procedure(s): TOTAL KNEE ARTHROPLASTY (Left) DePuy Sigma RP  SURGEON:  Surgeon(s) and Role:    Netta Cedars, MD - Primary  PHYSICIAN ASSISTANT:   ASSISTANTS: Ventura Bruns, PA-C   ANESTHESIA:   regional and spinal  EBL:  25 mL   BLOOD ADMINISTERED:none  DRAINS: none   LOCAL MEDICATIONS USED:  NONE  SPECIMEN:  No Specimen  DISPOSITION OF SPECIMEN:  N/A  COUNTS:  YES  TOURNIQUET:  1 hr 10 minutes at 300 mm Hg  DICTATION: .Other Dictation: Dictation Number [id number]  PLAN OF CARE: Admit to inpatient   PATIENT DISPOSITION:  PACU - hemodynamically stable.   Delay start of Pharmacological VTE agent (>24hrs) due to surgical blood loss or risk of bleeding: no

## 2018-08-26 NOTE — Evaluation (Signed)
Physical Therapy Evaluation Patient Details Name: [name] MRN: [id number] DOB: Jun 15, 1941 Today's Date: 08/26/2018   History of Present Illness  78 yo female s/p L TKR on 08/26/18. PMH includes lung cancer, anxiety, asthma, depression, GERD, OA, osteopenia, L ORIF of wrist  Clinical Impression   Pt presents with L knee pain, decreased L knee ROM, difficulty performing mobility tasks, and decreased tolerance for ambulation due to L knee pain. Pt to benefit from acute PT to address deficits. Pt ambulated 12 ft with RW with min guard assist, limited by pain. Pt educated on ankle pumps (20/hour) to perform this afternoon/evening to increase circulation, to pt's tolerance and limited by pain. PT to progress mobility as tolerated, and will continue to follow acutely.        Follow Up Recommendations Home health PT;Follow surgeon's recommendation for DC plan and follow-up therapies;Supervision for mobility/OOB    Equipment Recommendations  Rolling walker with 5" wheels    Recommendations for Other Services       Precautions / Restrictions Precautions Precautions: Fall Required Braces or Orthoses: Knee Immobilizer - Left Knee Immobilizer - Left: On except when in CPM Restrictions Weight Bearing Restrictions: No Other Position/Activity Restrictions: WBAT       Mobility  Bed Mobility Overal bed mobility: Needs Assistance Bed Mobility: Supine to Sit     Supine to sit: Mod assist;HOB elevated     General bed mobility comments: Mod assist for LLE lifting and translation to EOB, scooting to EOB with increased time.   Transfers Overall transfer level: Needs assistance Equipment used: Rolling walker (2 wheeled) Transfers: Sit to/from Stand Sit to Stand: Min assist;From elevated surface         General transfer comment: Min assist for steadying upon standing. Pt with pain with increased WB on LLE.   Ambulation/Gait Ambulation/Gait assistance: Min guard;+2  safety/equipment Gait Distance (Feet): 12 Feet Assistive device: Rolling walker (2 wheeled) Gait Pattern/deviations: Step-to pattern;Decreased stance time - left;Decreased weight shift to left;Antalgic Gait velocity: decr    General Gait Details: Min guard for safety. Verbal cuing for placement in RW, sequencing with step-to gait. Pt limited in ambulation distance by pain.   Stairs            Wheelchair Mobility    Modified Rankin (Stroke Patients Only)       Balance Overall balance assessment: Mild deficits observed, not formally tested                                           Pertinent Vitals/Pain Pain Assessment: 0-10 Pain Score: 5  Pain Location: L knee  Pain Descriptors / Indicators: Sore;Aching Pain Intervention(s): Limited activity within patient's tolerance;Repositioned;Ice applied;Monitored during session;Patient requesting pain meds-RN notified    Home Living Family/patient expects to be discharged to:: Private residence Living Arrangements: Alone Available Help at Discharge: Friend(s);Available 24 hours/day(friend to stay with pt "as long as I need her to" ) Type of Home: House Home Access: Stairs to enter Entrance Stairs-Rails: None Entrance Stairs-Number of Steps: 1 Home Layout: One level Home Equipment: Cane - single point      Prior Function Level of Independence: Independent with assistive device(s)         Comments: Pt reports using cane for ambulation PTA, but otherwise was independent      Hand Dominance   Dominant Hand: Right    Extremity/Trunk Assessment  Upper Extremity Assessment Upper Extremity Assessment: Overall WFL for tasks assessed    Lower Extremity Assessment Lower Extremity Assessment: Generalized weakness;LLE deficits/detail LLE Deficits / Details: suspected post-surgical weakness; able to perform ankle pumps, quad set, SLR with lift assist for donning KI, heel slide to 50* limited by pain  LLE  Sensation: WNL    Cervical / Trunk Assessment Cervical / Trunk Assessment: Normal  Communication      Cognition Arousal/Alertness: Awake/alert Behavior During Therapy: WFL for tasks assessed/performed Overall Cognitive Status: Within Functional Limits for tasks assessed                                        General Comments      Exercises Total Joint Exercises Goniometric ROM: knee aarom ~5-50*, limited by pain and stiffness   Assessment/Plan    PT Assessment Patient needs continued PT services  PT Problem List Decreased strength;Pain;Decreased range of motion;Decreased activity tolerance;Decreased balance;Decreased mobility;Decreased knowledge of use of DME       PT Treatment Interventions DME instruction;Therapeutic activities;Gait training;Therapeutic exercise;Patient/family education;Balance training;Stair training;Functional mobility training    PT Goals (Current goals can be found in the Care Plan section)  Acute Rehab PT Goals Patient Stated Goal: go home, with friend  PT Goal Formulation: With patient Time For Goal Achievement: 09/02/18 Potential to Achieve Goals: Good    Frequency 7X/week   Barriers to discharge        Co-evaluation               AM-PAC PT "6 Clicks" Mobility  Outcome Measure Help needed turning from your back to your side while in a flat bed without using bedrails?: A Lot Help needed moving from lying on your back to sitting on the side of a flat bed without using bedrails?: A Little Help needed moving to and from a bed to a chair (including a wheelchair)?: A Little Help needed standing up from a chair using your arms (e.g., wheelchair or bedside chair)?: A Little Help needed to walk in hospital room?: A Little Help needed climbing 3-5 steps with a railing? : A Little 6 Click Score: 17    End of Session Equipment Utilized During Treatment: Gait belt Activity Tolerance: Patient tolerated treatment well;Patient  limited by pain Patient left: in chair;with chair alarm set;with call bell/phone within reach(on SCD break due to shift change ) Nurse Communication: Mobility status;Patient requests pain meds PT Visit Diagnosis: Other abnormalities of gait and mobility (R26.89);Difficulty in walking, not elsewhere classified (R26.2)    Time: 1751-0258 PT Time Calculation (min) (ACUTE ONLY): 23 min   Charges:   PT Evaluation $PT Eval Low Complexity: 1 Low PT Treatments $Gait Training: 8-22 mins        Julien Girt, PT Acute Rehabilitation Services Pager [phone]  Office [phone]   [name] 08/26/2018, 7:16 PM

## 2018-08-26 NOTE — Anesthesia Procedure Notes (Signed)
Anesthesia Regional Block: Adductor canal block   Pre-Anesthetic Checklist: ,, timeout performed, Correct Patient, Correct Site, Correct Laterality, Correct Procedure, Correct Position, site marked, Risks and benefits discussed,  Surgical consent,  Pre-op evaluation,  At surgeon's request and post-op pain management  Laterality: Left  Prep: chloraprep       Needles:  Injection technique: Single-shot  Needle Type: Echogenic Stimulator Needle     Needle Length: 9cm  Needle Gauge: 21   Needle insertion depth: 8 cm   Additional Needles:   Procedures:,,,, ultrasound used (permanent image in chart),,,,  Narrative:  Start time: 08/26/2018 8:56 AM End time: 08/26/2018 9:01 AM Injection made incrementally with aspirations every 5 mL.  Performed by: Personally  Anesthesiologist: Josephine Igo, MD  Additional Notes: Timeout performed. Patient sedated. Relevant anatomy ID'd using [place]. Incremental 2-59ml injection of LA with frequent aspiration. Patient tolerated procedure well.        Left Adductor Canal Block

## 2018-08-26 NOTE — Transfer of Care (Signed)
Immediate Anesthesia Transfer of Care Note  Patient: [name]  Procedure(s) Performed: TOTAL KNEE ARTHROPLASTY (Left Knee)  Patient Location: PACU  Anesthesia Type:Spinal  Level of Consciousness: awake and patient cooperative  Airway & Oxygen Therapy: Patient Spontanous Breathing and Patient connected to nasal cannula oxygen  Post-op Assessment: Report given to RN and Post -op Vital signs reviewed and stable  Post vital signs: Reviewed and stable  Last Vitals:  Vitals Value Taken Time  BP 126/73 08/26/2018 11:45 AM  Temp    Pulse 73 08/26/2018 11:46 AM  Resp 14 08/26/2018 11:46 AM  SpO2 100 % 08/26/2018 11:46 AM  Vitals shown include unvalidated device data.  Last Pain:  Vitals:   08/26/18 0900  TempSrc:   PainSc: 0-No pain         Complications: No apparent anesthesia complications

## 2018-08-26 NOTE — Anesthesia Procedure Notes (Signed)
Spinal  Patient location during procedure: OR Start time: 08/26/2018 9:55 AM End time: 08/26/2018 9:59 AM Staffing Anesthesiologist: Catalina Gravel, MD Performed: anesthesiologist  Preanesthetic Checklist Completed: patient identified, surgical consent, pre-op evaluation, timeout performed, IV checked, risks and benefits discussed and monitors and equipment checked Spinal Block Patient position: sitting Prep: site prepped and draped and DuraPrep Patient monitoring: continuous pulse ox and blood pressure Approach: midline Location: L3-4 Injection technique: single-shot Needle Needle type: Pencan  Needle gauge: 24 G Additional Notes Functioning IV was confirmed and monitors were applied. Sterile prep and drape, including hand hygiene, mask and sterile gloves were used. The patient was positioned and the spine was prepped. The skin was anesthetized with lidocaine.  Free flow of clear CSF was obtained prior to injecting local anesthetic into the CSF.  The spinal needle aspirated freely following injection.  The needle was carefully withdrawn.  The patient tolerated the procedure well. Consent was obtained prior to procedure with all questions answered and concerns addressed. Risks including but not limited to bleeding, infection, nerve damage, paralysis, failed block, inadequate analgesia, allergic reaction, high spinal, itching and headache were discussed and the patient wished to proceed.   Hoy Morn, MD

## 2018-08-26 NOTE — Discharge Instructions (Signed)
Keep the knee incision covered and clean and dry for one week, then ok to get it wet in the shower  Wear the knee brace at night to maintain extension while you sleep.  Do exercises every hour while you are awake to get your knee range of motion back.  You are ok to bear full weight on the left leg.   Take the baby aspirin twice daily and wear your TED hose 24/7 for 30 days to prevent blood clots  Follow up with Dr Veverly Fells in the office in two weeks.  Call [phone] for appt

## 2018-08-26 NOTE — Progress Notes (Signed)
Assisted Dr. Royce Macadamia with left, ultrasound guided, adductor canal block. Side rails up, monitors on throughout procedure. See vital signs in flow sheet. Tolerated Procedure well.

## 2018-08-26 NOTE — Op Note (Signed)
[name], HARNEY MEDICAL RECORD [id number] ACCOUNT [id number] DATE OF BIRTH:[birth year] FACILITY: [facility] LOCATION: [facility] PHYSICIAN:STEVEN Orlena Sheldon, MD  OPERATIVE REPORT  DATE OF PROCEDURE:  08/26/2018  PREOPERATIVE DIAGNOSIS:  Left knee end-stage osteoarthritis.  POSTOPERATIVE DIAGNOSIS:  Left knee end-stage osteoarthritis.  PROCEDURE PERFORMED:  Left total knee replacement using DePuy Sigma rotating platform prosthesis.  ATTENDING SURGEON:  Esmond Plants, MD  ASSISTANT:  Darol Destine, [name], who was scrubbed during the entire procedure and necessary for satisfactory completion of surgery.  ANESTHESIA:  Spinal anesthesia was used plus adductor canal block.  ESTIMATED BLOOD LOSS:  Minimal.  FLUID REPLACEMENT:  1500 mL crystalloid.  INSTRUMENT COUNTS:  Correct.  COMPLICATIONS:  None.  ANTIBIOTICS:  Given.  TOURNIQUET TIME:  1  hour and 10 minutes at 300 mmHg.  INDICATIONS:  The patient is a 78 year old female with worsening left knee pain secondary to end-stage arthritis.  The patient has had progressive pain despite conservative management.  Desires operative treatment to restore function and eliminate pain.   Informed consent was obtained.  DESCRIPTION OF PROCEDURE:  After an adequate level of spinal anesthesia was achieved, the patient was positioned supine on the operating table.  Left leg was correctly identified.  Nonsterile tourniquet placed on the proximal thigh.  Left leg sterilely  prepped and draped in the usual manner.  Time-out called verifying correct patient, correct site.  We entered the knee using a midline incision created with the knee in flexion after exsanguinating the limb with an Esmarch bandage and elevated the  tourniquet to 300 mmHg.  Dissection down through subcutaneous tissues.  A medial parapatellar arthrotomy was created with a fresh 10-blade scalpel.  We divided lateral patellofemoral ligaments, everted the patella, and exposed  the distal femur.  We  entered the distal femur with a step-cut drill, placed intramedullary resection guide set on 5 degrees left and resected 10 mm off the distal femur as there was no flexion contracture.  Next, we tried to size the femur anterior down, but there was  significant tightness in the medial compartment.  We decided to go ahead and perform our tibial cut.  We removed ACL, PCL, meniscal tissue, subluxed the tibia anteriorly and performed our tibial cut 90 degrees perpendicular to the long axis of the tibia  with the external alignment jig with minimal posterior slope for this posterior cruciate substituting prosthesis.  We wound up taking less than a millimeter thickness on the medial side and a fairly thick 6 or 7 mm of the lateral side just based on the  amount of wear medially, but we had a nice perpendicular cut.  We then went ahead and completed our femoral preparation, sizing the femur to size 3 anterior down and performed anterior, posterior and chamfer cuts with a 4-in-1 block using an oscillating  saw.  We then used a lamina spreader to remove posterior osteophytes off the posterior femoral condyle and then checked our gaps, which were at least 12.5 mm and symmetric.  At that point, we completed our tibial preparation with the modular drill and  keel punch for a size 3 tibia, externally rotating the component as much as we could, relatively internally rotating the tibial tubercle.  Next, we did our box cut guide for the femur, cutting our box for the posterior cruciate substituting femoral  prosthesis, a size 3 left, impacted that in place, and then reduced the knee with initially a 12.5 and then a 15 poly trial insert.  Next, we  went ahead and resurfaced our patella, going from a 22 mm thickness down to 16 mm thickness.  We drilled lug  holes and then placed a 38 patellar button on the patella.  We ranged the knee and had excellent patellar tracking with no-touch technique.  We  removed the trial components, irrigated thoroughly and dried the bone.  We then went ahead and cemented the  components into place, a size 3 tibia, size 3 right left femur, and used a 15 poly trial just to hold the component in extension and with compression during the cement setting off stage.  We also used a patellar clamp for the 38 patella.  Once we  completed that, we trialed with a 17.5 and felt like it was still a little bit loose in flexion and felt like we could probably get extension with a 20.  We went ahead and selected the real size 3, 20 poly and placed that on the tibial tray and then  reduced the knee.  We had a nice little pop as that reduced condyles over the tibial tray, and the knee was stable both in flexion and extension, and patellar tracking was excellent.  We irrigated thoroughly, closed the parapatellar arthrotomy with #1  Vicryl suture, followed by 2-0 Vicryl for subcutaneous closure and 4-0 Monocryl for skin.  Steri-Strips applied, followed by a sterile dressing.  The patient tolerated surgery well.  LN/NUANCE  D:08/26/2018 T:08/26/2018 JOB:[id number]

## 2018-08-26 NOTE — Plan of Care (Signed)

## 2018-08-26 NOTE — Interval H&P Note (Signed)
History and Physical Interval Note:  08/26/2018 8:53 AM  [name]  has presented today for surgery, with the diagnosis of Left knee end-stage osteoarthritis  The various methods of treatment have been discussed with the patient and family. After consideration of risks, benefits and other options for treatment, the patient has consented to  Procedure(s): TOTAL KNEE ARTHROPLASTY (Left) as a surgical intervention .  The patient's history has been reviewed, patient examined, no change in status, stable for surgery.  I have reviewed the patient's chart and labs.  Questions were answered to the patient's satisfaction.     Augustin Schooling

## 2018-08-26 NOTE — Anesthesia Preprocedure Evaluation (Addendum)
Anesthesia Evaluation  Patient identified by MRN, date of birth, ID band Patient awake    Reviewed: Allergy & Precautions, NPO status , Patient's Chart, lab work & pertinent test results  History of Anesthesia Complications (+) PONV and history of anesthetic complications  Airway Mallampati: III  TM Distance: >3 FB Neck ROM: Full    Dental no notable dental hx. (+) Teeth Intact   Pulmonary asthma , former smoker,  Hx/o lung Ca S/P RLL lobectomy   Pulmonary exam normal breath sounds clear to auscultation       Cardiovascular hypertension, Pt. on medications Normal cardiovascular exam Rhythm:Regular Rate:Normal     Neuro/Psych PSYCHIATRIC DISORDERS Anxiety Depression Hx/o chronic iritis    GI/Hepatic Neg liver ROS, GERD  Medicated and Controlled,  Endo/Other  Hypertriglyceridemia Obesity  Renal/GU Renal disease  negative genitourinary   Musculoskeletal  (+) Arthritis , Osteoarthritis,  OA left knee   Abdominal (+) + obese,   Peds  Hematology negative hematology ROS (+)   Anesthesia Other Findings   Reproductive/Obstetrics                            Anesthesia Physical Anesthesia Plan  ASA: III  Anesthesia Plan: Spinal   Post-op Pain Management:  Regional for Post-op pain   Induction: Intravenous  PONV Risk Score and Plan: 4 or greater and Ondansetron, Dexamethasone and Treatment may vary due to age or medical condition  Airway Management Planned:   Additional Equipment:   Intra-op Plan:   Post-operative Plan:   Informed Consent: I have reviewed the patients History and Physical, chart, labs and discussed the procedure including the risks, benefits and alternatives for the proposed anesthesia with the patient or authorized representative who has indicated his/her understanding and acceptance.     Dental advisory given  Plan Discussed with: CRNA and Surgeon  Anesthesia  Plan Comments:         Anesthesia Quick Evaluation

## 2018-08-26 NOTE — Anesthesia Postprocedure Evaluation (Signed)
Anesthesia Post Note  Patient: [name]  Procedure(s) Performed: TOTAL KNEE ARTHROPLASTY (Left Knee)     Patient location during evaluation: PACU Anesthesia Type: Spinal Level of consciousness: oriented and awake and alert Pain management: pain level controlled Vital Signs Assessment: post-procedure vital signs reviewed and stable Respiratory status: spontaneous breathing, respiratory function stable, nonlabored ventilation and patient connected to nasal cannula oxygen Cardiovascular status: blood pressure returned to baseline and stable Postop Assessment: no headache, no backache and no apparent nausea or vomiting Anesthetic complications: no    Last Vitals:  Vitals:   08/26/18 1200 08/26/18 1215  BP: 126/69 136/86  Pulse: 67 69  Resp: 19 18  Temp:    SpO2: 100% 99%    Last Pain:  Vitals:   08/26/18 1215  TempSrc:   PainSc: 0-No pain    LLE Motor Response: Purposeful movement;Responds to commands (08/26/18 1215) LLE Sensation: Numbness (08/26/18 1215) RLE Motor Response: Purposeful movement;Responds to commands (08/26/18 1215) RLE Sensation: Numbness (08/26/18 1215) L Sensory Level: L5-Outer lower leg, top of foot, great toe (08/26/18 1215) R Sensory Level: L4-Anterior knee, lower leg (08/26/18 1215)  Onica Davidovich A.

## 2018-08-27 DIAGNOSIS — Z96659 Presence of unspecified artificial knee joint: Secondary | ICD-10-CM

## 2018-08-27 DIAGNOSIS — M1712 Unilateral primary osteoarthritis, left knee: Secondary | ICD-10-CM | POA: Diagnosis not present

## 2018-08-27 HISTORY — DX: Presence of unspecified artificial knee joint: Z96.659

## 2018-08-27 LAB — BASIC METABOLIC PANEL
Anion gap: 6 (ref 5–15)
BUN: 18 mg/dL (ref 8–23)
CO2: 25 mmol/L (ref 22–32)
Calcium: 9 mg/dL (ref 8.9–10.3)
Chloride: 106 mmol/L (ref 98–111)
Creatinine, Ser: 1.1 mg/dL — ABNORMAL HIGH (ref 0.44–1.00)
GFR calc Af Amer: 56 mL/min — ABNORMAL LOW (ref >60)
GFR calc non Af Amer: 48 mL/min — ABNORMAL LOW (ref >60)
Glucose, Bld: 122 mg/dL — ABNORMAL HIGH (ref 70–99)
Potassium: 4.5 mmol/L (ref 3.5–5.1)
Sodium: 137 mmol/L (ref 135–145)

## 2018-08-27 LAB — CBC
HCT: 37.2 % (ref 36.0–46.0)
Hemoglobin: 11.3 g/dL — ABNORMAL LOW (ref 12.0–15.0)
MCH: 30.5 pg (ref 26.0–34.0)
MCHC: 30.4 g/dL (ref 30.0–36.0)
MCV: 100.5 fL — ABNORMAL HIGH (ref 80.0–100.0)
Platelets: 289 10*3/uL (ref 150–400)
RBC: 3.7 MIL/uL — ABNORMAL LOW (ref 3.87–5.11)
RDW: 12.5 % (ref 11.5–15.5)
WBC: 9.9 10*3/uL (ref 4.0–10.5)
nRBC: 0 % (ref 0.0–0.2)

## 2018-08-27 NOTE — Care Management CC44 (Signed)
Condition Code 44 Documentation Completed  Patient Details  Name: JAUNA RACZYNSKI MRN: [id number] Date of Birth: Nov 16, 1940   Condition Code 44 given:  Yes Patient signature on Condition Code 44 notice:  Yes Documentation of 2 MD's agreement:  Yes Code 44 added to claim:  Yes    Erenest Rasher, RN 08/27/2018, 12:27 PM

## 2018-08-27 NOTE — Progress Notes (Signed)
Physical Therapy Treatment Patient Details Name: [name] MRN: [id number] DOB: 20-Mar-1941 Today's Date: 08/27/2018    History of Present Illness 78 yo female s/p L TKR on 08/26/18. PMH includes lung cancer, anxiety, asthma, depression, GERD, OA, osteopenia, L ORIF of wrist    PT Comments    Pt very cooperative and progressing with mobility but continues ltd by pain and fatigues easily.  This pm pt able to tolerate ambulation to bathroom, toileting, performing hand hygiene at sink and ambulating short distance in hall.  Follow Up Recommendations  Home health PT;Follow surgeon's recommendation for DC plan and follow-up therapies;Supervision for mobility/OOB     Equipment Recommendations  Rolling walker with 5" wheels    Recommendations for Other Services       Precautions / Restrictions Precautions Precautions: Fall Required Braces or Orthoses: Knee Immobilizer - Left Knee Immobilizer - Left: On except when in CPM Restrictions Weight Bearing Restrictions: No Other Position/Activity Restrictions: WBAT     Mobility  Bed Mobility Overal bed mobility: Needs Assistance Bed Mobility: Sit to Supine     Supine to sit: Min assist;Mod assist;HOB elevated Sit to supine: Min assist;Mod assist   General bed mobility comments: cues for sequence and use of R LE to self assist;  Physical assist to manage L LE and to control trunk  Transfers Overall transfer level: Needs assistance Equipment used: Rolling walker (2 wheeled) Transfers: Sit to/from Stand Sit to Stand: Min assist;Min guard         General transfer comment: Min assist for steadying upon standing. Pt with pain with increased WB on LLE.   Ambulation/Gait Ambulation/Gait assistance: Min assist;Min guard Gait Distance (Feet): 44 Feet(and 16' into bathroom) Assistive device: Rolling walker (2 wheeled) Gait Pattern/deviations: Step-to pattern;Decreased stance time - left;Decreased weight shift to  left;Antalgic Gait velocity: decr    General Gait Details: Verbal cuing for placement in RW, sequencing with step-to gait. Pt limited in ambulation distance by pain./fatigue   Stairs             Wheelchair Mobility    Modified Rankin (Stroke Patients Only)       Balance Overall balance assessment: Mild deficits observed, not formally tested                                          Cognition Arousal/Alertness: Awake/alert Behavior During Therapy: WFL for tasks assessed/performed Overall Cognitive Status: Within Functional Limits for tasks assessed                                        Exercises Total Joint Exercises Ankle Circles/Pumps: AROM;Both;20 reps;Supine Quad Sets: AROM;Both;10 reps;Supine Heel Slides: AAROM;Left;15 reps;Supine Straight Leg Raises: AAROM;Left;10 reps;Supine Goniometric ROM: AAROM L knee -5 - 60    General Comments        Pertinent Vitals/Pain Pain Assessment: 0-10 Pain Score: 7  Pain Location: L knee  Pain Descriptors / Indicators: Sore;Aching Pain Intervention(s): Limited activity within patient's tolerance;Monitored during session;Premedicated before session;Ice applied    Home Living                      Prior Function            PT Goals (current goals can now be found in the care plan  section) Acute Rehab PT Goals Patient Stated Goal: go home, with friend  PT Goal Formulation: With patient Time For Goal Achievement: 09/02/18 Potential to Achieve Goals: Good Progress towards PT goals: Progressing toward goals    Frequency    7X/week      PT Plan Current plan remains appropriate    Co-evaluation              AM-PAC PT "6 Clicks" Mobility   Outcome Measure  Help needed turning from your back to your side while in a flat bed without using bedrails?: A Lot Help needed moving from lying on your back to sitting on the side of a flat bed without using bedrails?: A  Little Help needed moving to and from a bed to a chair (including a wheelchair)?: A Little Help needed standing up from a chair using your arms (e.g., wheelchair or bedside chair)?: A Little Help needed to walk in hospital room?: A Little Help needed climbing 3-5 steps with a railing? : A Little 6 Click Score: 17    End of Session Equipment Utilized During Treatment: Gait belt Activity Tolerance: Patient tolerated treatment well;Patient limited by pain;Patient limited by fatigue Patient left: in bed;with call bell/phone within reach Nurse Communication: Mobility status;Patient requests pain meds PT Visit Diagnosis: Other abnormalities of gait and mobility (R26.89);Difficulty in walking, not elsewhere classified (R26.2)     Time: 1583-0940 PT Time Calculation (min) (ACUTE ONLY): 30 min  Charges:  $Gait Training: 8-22 mins $Therapeutic Exercise: 8-22 mins $Therapeutic Activity: 8-22 mins                     Debe Coder PT Acute Rehabilitation Services Pager [phone] Office [phone]    [facility] 08/27/2018, 3:42 PM

## 2018-08-27 NOTE — Plan of Care (Signed)
Plan of care reviewed and discussed with the patient. 

## 2018-08-27 NOTE — Progress Notes (Signed)
Physical Therapy Treatment Patient Details Name: [name] MRN: [id number] DOB: 1940/09/13 Today's Date: 08/27/2018    History of Present Illness 78 yo female s/p L TKR on 08/26/18. PMH includes lung cancer, anxiety, asthma, depression, GERD, OA, osteopenia, L ORIF of wrist    PT Comments    Pt very cooperative and progressing with mobility but continues ltd by pain and fatigues easily.   Follow Up Recommendations  Home health PT;Follow surgeon's recommendation for DC plan and follow-up therapies;Supervision for mobility/OOB     Equipment Recommendations  Rolling walker with 5" wheels    Recommendations for Other Services       Precautions / Restrictions Precautions Precautions: Fall Required Braces or Orthoses: Knee Immobilizer - Left Knee Immobilizer - Left: On except when in CPM Restrictions Weight Bearing Restrictions: No Other Position/Activity Restrictions: WBAT     Mobility  Bed Mobility Overal bed mobility: Needs Assistance Bed Mobility: Supine to Sit     Supine to sit: Min assist;Mod assist;HOB elevated     General bed mobility comments: cues for sequence and use of R LE to self assist;  Physical assist to manage L LE and to bring trunk to upright position  Transfers Overall transfer level: Needs assistance Equipment used: Rolling walker (2 wheeled) Transfers: Sit to/from Stand Sit to Stand: Min assist;From elevated surface         General transfer comment: Min assist for steadying upon standing. Pt with pain with increased WB on LLE.   Ambulation/Gait Ambulation/Gait assistance: Min assist Gait Distance (Feet): 62 Feet Assistive device: Rolling walker (2 wheeled) Gait Pattern/deviations: Step-to pattern;Decreased stance time - left;Decreased weight shift to left;Antalgic Gait velocity: decr    General Gait Details: Verbal cuing for placement in RW, sequencing with step-to gait. Pt limited in ambulation distance by  pain./fatigue   Stairs             Wheelchair Mobility    Modified Rankin (Stroke Patients Only)       Balance Overall balance assessment: Mild deficits observed, not formally tested                                          Cognition Arousal/Alertness: Awake/alert Behavior During Therapy: WFL for tasks assessed/performed Overall Cognitive Status: Within Functional Limits for tasks assessed                                        Exercises Total Joint Exercises Ankle Circles/Pumps: AROM;Both;20 reps;Supine Quad Sets: AROM;Both;10 reps;Supine Heel Slides: AAROM;Left;15 reps;Supine Straight Leg Raises: AAROM;Left;10 reps;Supine Goniometric ROM: AAROM L knee -5 - 60    General Comments        Pertinent Vitals/Pain Pain Assessment: 0-10 Pain Score: 6  Pain Location: L knee  Pain Descriptors / Indicators: Sore;Aching Pain Intervention(s): Limited activity within patient's tolerance;Monitored during session;Premedicated before session;Ice applied    Home Living                      Prior Function            PT Goals (current goals can now be found in the care plan section) Acute Rehab PT Goals Patient Stated Goal: go home, with friend  PT Goal Formulation: With patient Time For Goal Achievement: 09/02/18 Potential to  Achieve Goals: Good Progress towards PT goals: Progressing toward goals    Frequency    7X/week      PT Plan Current plan remains appropriate    Co-evaluation              AM-PAC PT "6 Clicks" Mobility   Outcome Measure  Help needed turning from your back to your side while in a flat bed without using bedrails?: A Lot Help needed moving from lying on your back to sitting on the side of a flat bed without using bedrails?: A Little Help needed moving to and from a bed to a chair (including a wheelchair)?: A Little Help needed standing up from a chair using your arms (e.g., wheelchair or  bedside chair)?: A Little Help needed to walk in hospital room?: A Little Help needed climbing 3-5 steps with a railing? : A Little 6 Click Score: 17    End of Session Equipment Utilized During Treatment: Gait belt Activity Tolerance: Patient tolerated treatment well;Patient limited by pain;Patient limited by fatigue Patient left: in chair;with chair alarm set;with call bell/phone within reach Nurse Communication: Mobility status;Patient requests pain meds PT Visit Diagnosis: Other abnormalities of gait and mobility (R26.89);Difficulty in walking, not elsewhere classified (R26.2)     Time: 5364-6803 PT Time Calculation (min) (ACUTE ONLY): 40 min  Charges:  $Gait Training: 8-22 mins $Therapeutic Exercise: 8-22 mins $Therapeutic Activity: 8-22 mins                     Debe Coder PT Acute Rehabilitation Services Pager [phone] Office [phone]    [facility] 08/27/2018, 3:34 PM

## 2018-08-27 NOTE — Progress Notes (Signed)
   Subjective: 1 Day Post-Op Procedure(s) (LRB): TOTAL KNEE ARTHROPLASTY (Left)  Recheck left knee s/p total knee yesterday Pt doing well Minimal pain in the knee currently  Denies any new symptoms or issues Patient reports pain as mild.  Objective:   VITALS:   Vitals:   08/27/18 0203 08/27/18 0546  BP: 127/62 127/60  Pulse: (!) 59 62  Resp: 16 16  Temp: 97.8 F (36.6 C) 97.9 F (36.6 C)  SpO2: 99% 100%    Left knee dressing intact nv intact distally No rashes or edema Good early rom  LABS Recent Labs    08/27/18 0415  HGB 11.3*  HCT 37.2  WBC 9.9  PLT 289    Recent Labs    08/27/18 0415  NA 137  K 4.5  BUN 18  CREATININE 1.10*  GLUCOSE 122*     Assessment/Plan: 1 Day Post-Op Procedure(s) (LRB): TOTAL KNEE ARTHROPLASTY (Left) PT/OT  Pain management as needed Possible d/c tomorrow if doing well with therapy    Kathrynn Speed, MPAS [place] is now [facility]  [place] [address], [place], [place], [place] [address] Phone: [phone] [email or web address] Facebook  [employer]

## 2018-08-27 NOTE — Care Management Note (Signed)
Case Management Note  Patient Details  Name: [name] MRN: [id number] Date of Birth: [birth year]  Subjective/Objective:      Pt is s/p Left TKA              Action/Plan: NCM spoke to pt and offered choice for HH/Medicare list given and placed on chart. Pt agreeable to [facility] for [place]. Contacted [facility] for RW and 3n1 bedside commode for home to be delivered to room prior to dc.   Expected Discharge Date:                  Expected Discharge Plan:  [place]  In-House Referral:  NA  Discharge planning Services  CM Consult  Post Acute Care Choice:  Home Health Choice offered to:  Patient  DME Arranged:  3-N-1, Walker rolling DME Agency:  [place]:  PT [place]:  [facility] ([facility])  Status of Service:  Completed, signed off  If discussed at [employer], dates discussed:    Additional Comments:  Erenest Rasher, RN 08/27/2018, 12:36 PM

## 2018-08-27 NOTE — Care Management Obs Status (Signed)
[place] NOTIFICATION   Patient Details  Name: [name] MRN: [id number] Date of Birth: Jan 05, 1941   Medicare Observation Status Notification Given:  Yes    Erenest Rasher, RN 08/27/2018, 12:27 PM

## 2018-08-28 DIAGNOSIS — M1712 Unilateral primary osteoarthritis, left knee: Secondary | ICD-10-CM | POA: Diagnosis not present

## 2018-08-28 LAB — CBC
HCT: 37.8 % (ref 36.0–46.0)
Hemoglobin: 11.7 g/dL — ABNORMAL LOW (ref 12.0–15.0)
MCH: 30.2 pg (ref 26.0–34.0)
MCHC: 31 g/dL (ref 30.0–36.0)
MCV: 97.4 fL (ref 80.0–100.0)
Platelets: 307 10*3/uL (ref 150–400)
RBC: 3.88 MIL/uL (ref 3.87–5.11)
RDW: 12.5 % (ref 11.5–15.5)
WBC: 9.2 10*3/uL (ref 4.0–10.5)
nRBC: 0 % (ref 0.0–0.2)

## 2018-08-28 NOTE — Progress Notes (Signed)
Physical Therapy Treatment Patient Details Name: [name] MRN: [id number] DOB: [birth year] Today's Date: 08/28/2018    History of Present Illness 78 yo female s/p L TKR on 08/26/18. PMH includes lung cancer, anxiety, asthma, depression, GERD, OA, osteopenia, L ORIF of wrist    PT Comments    Pt assisted with ambulating in hallway and very unsteady upon returning to room (reports increased pain and no sleep last night, denies dizziness).  Pt performed LE exercises upon returning to room.   Follow Up Recommendations  Home health PT;Follow surgeon's recommendation for DC plan and follow-up therapies;Supervision for mobility/OOB     Equipment Recommendations  Rolling walker with 5" wheels    Recommendations for Other Services       Precautions / Restrictions Precautions Precautions: Fall Required Braces or Orthoses: Knee Immobilizer - Left Knee Immobilizer - Left: On except when in CPM Restrictions Other Position/Activity Restrictions: WBAT     Mobility  Bed Mobility Overal bed mobility: Needs Assistance Bed Mobility: Sit to Supine     Supine to sit: Min assist     General bed mobility comments: assist for L LE  Transfers Overall transfer level: Needs assistance Equipment used: Rolling walker (2 wheeled) Transfers: Sit to/from Stand Sit to Stand: Min assist         General transfer comment: assist to rise and steady, verbal cues for UE and LE positioning  Ambulation/Gait Ambulation/Gait assistance: Min assist Gait Distance (Feet): 40 Feet Assistive device: Rolling walker (2 wheeled) Gait Pattern/deviations: Step-to pattern;Decreased stance time - left;Decreased weight shift to left;Antalgic     General Gait Details: verbal cues for sequence, RW positioning, step length, posture; pt fatigued quickly and required more assist upon returning to room with increased safety cues as well   Stairs             Wheelchair Mobility    Modified Rankin  (Stroke Patients Only)       Balance                                            Cognition Arousal/Alertness: Awake/alert Behavior During Therapy: WFL for tasks assessed/performed Overall Cognitive Status: Within Functional Limits for tasks assessed                                        Exercises Total Joint Exercises Ankle Circles/Pumps: AROM;Both;20 reps;Supine Quad Sets: AROM;Both;10 reps;Supine Short Arc Quad: AAROM;10 reps;Left Heel Slides: AAROM;Left;Supine;10 reps Hip ABduction/ADduction: AAROM;10 reps;Left Straight Leg Raises: AAROM;10 reps;Left    General Comments        Pertinent Vitals/Pain Pain Assessment: 0-10 Pain Score: 6  Pain Location: L knee  Pain Descriptors / Indicators: Sore;Aching Pain Intervention(s): Premedicated before session;Monitored during session;Repositioned;Ice applied    Home Living                      Prior Function            PT Goals (current goals can now be found in the care plan section) Progress towards PT goals: Progressing toward goals    Frequency    7X/week      PT Plan Current plan remains appropriate    Co-evaluation  AM-PAC PT "6 Clicks" Mobility   Outcome Measure  Help needed turning from your back to your side while in a flat bed without using bedrails?: A Lot Help needed moving from lying on your back to sitting on the side of a flat bed without using bedrails?: A Little Help needed moving to and from a bed to a chair (including a wheelchair)?: A Little Help needed standing up from a chair using your arms (e.g., wheelchair or bedside chair)?: A Little Help needed to walk in hospital room?: A Little Help needed climbing 3-5 steps with a railing? : A Little 6 Click Score: 17    End of Session Equipment Utilized During Treatment: Gait belt Activity Tolerance: Patient limited by pain;Patient limited by fatigue Patient left: in chair;with  call bell/phone within reach;with chair alarm set   PT Visit Diagnosis: Other abnormalities of gait and mobility (R26.89);Difficulty in walking, not elsewhere classified (R26.2)     Time: 8850-2774 PT Time Calculation (min) (ACUTE ONLY): 26 min  Charges:  $Gait Training: 8-22 mins $Therapeutic Exercise: 8-22 mins                    Carmelia Bake, PT, DPT Acute Rehabilitation Services Office: [phone] Pager: [phone]  Trena Platt 08/28/2018, 4:08 PM

## 2018-08-28 NOTE — Progress Notes (Signed)
Subjective: 2 Days Post-Op Procedure(s) (LRB): TOTAL KNEE ARTHROPLASTY (Left) Patient reports pain as 2 on 0-10 scale. Dressing changed and wound looks fine.She is doing very well this morning.   Objective: Vital signs in last 24 hours: Temp:  [97.5 F (36.4 C)-99.1 F (37.3 C)] 98.3 F (36.8 C) (02/23 0555) Pulse Rate:  [57-76] 72 (02/23 0555) Resp:  [16-18] 16 (02/23 0555) BP: (121-165)/(68-94) 164/94 (02/23 0555) SpO2:  [95 %-99 %] 99 % (02/23 0555)  Intake/Output from previous day: 02/22 0701 - 02/23 0700 In: 696.4 [P.O.:480; I.V.:216.4] Out: 750 [Urine:750] Intake/Output this shift: No intake/output data recorded.  Recent Labs    08/27/18 0415 08/28/18 0437  HGB 11.3* 11.7*   Recent Labs    08/27/18 0415 08/28/18 0437  WBC 9.9 9.2  RBC 3.70* 3.88  HCT 37.2 37.8  PLT 289 307   Recent Labs    08/27/18 0415  NA 137  K 4.5  CL 106  CO2 25  BUN 18  CREATININE 1.10*  GLUCOSE 122*  CALCIUM 9.0   No results for input(s): LABPT, INR in the last 72 hours.  Dorsiflexion/Plantar flexion intact No cellulitis present   Assessment/Plan: 2 Days Post-Op Procedure(s) (LRB): TOTAL KNEE ARTHROPLASTY (Left) Up with therapy      Latanya Maudlin 08/28/2018, 8:49 AM

## 2018-08-28 NOTE — Progress Notes (Signed)
Physical Therapy Treatment Patient Details Name: [name] MRN: [id number] DOB: 07-30-1940 Today's Date: 08/28/2018    History of Present Illness 78 yo female s/p L TKR on 08/26/18. PMH includes lung cancer, anxiety, asthma, depression, GERD, OA, osteopenia, L ORIF of wrist    PT Comments    Pt ambulated in hallway and assisted back to bed.  Pt's friend present and observed therapist assisting pt since she will assist pt once home.  Pt requiring safety cues and seems a little groggy with decreased cognition. Pt does not appear safe for d/c home today (RN notified).     Follow Up Recommendations  Home health PT;Follow surgeon's recommendation for DC plan and follow-up therapies;Supervision for mobility/OOB     Equipment Recommendations  Rolling walker with 5" wheels    Recommendations for Other Services       Precautions / Restrictions Precautions Precautions: Fall Required Braces or Orthoses: Knee Immobilizer - Left Knee Immobilizer - Left: On except when in CPM Restrictions Other Position/Activity Restrictions: WBAT     Mobility  Bed Mobility Overal bed mobility: Needs Assistance Bed Mobility: Sit to Supine     Supine to sit: Min assist Sit to supine: Min assist   General bed mobility comments: assist for L LE, pt attemtped to self assist however struggling  Transfers Overall transfer level: Needs assistance Equipment used: Rolling walker (2 wheeled) Transfers: Sit to/from Stand Sit to Stand: Min assist         General transfer comment: assist to rise and steady, verbal cues for UE and LE positioning  Ambulation/Gait Ambulation/Gait assistance: Min assist Gait Distance (Feet): 70 Feet Assistive device: Rolling walker (2 wheeled) Gait Pattern/deviations: Step-to pattern;Decreased stance time - left;Decreased weight shift to left;Antalgic     General Gait Details: verbal cues for sequence, RW positioning, step length, posture; safety cues again upon  returning to room as pt tends to rush with fatigue however better following safety cues this afternoon   Stairs             Wheelchair Mobility    Modified Rankin (Stroke Patients Only)       Balance                                            Cognition Arousal/Alertness: Awake/alert Behavior During Therapy: WFL for tasks assessed/performed Overall Cognitive Status: Within Functional Limits for tasks assessed                                        Exercises Total Joint Exercises Ankle Circles/Pumps: AROM;Both;20 reps;Supine Quad Sets: AROM;Both;10 reps;Supine Short Arc Quad: AAROM;10 reps;Left Heel Slides: AAROM;Left;Supine;10 reps Hip ABduction/ADduction: AAROM;10 reps;Left Straight Leg Raises: AAROM;10 reps;Left    General Comments        Pertinent Vitals/Pain Pain Assessment: 0-10 Pain Score: 6  Pain Location: L knee  Pain Descriptors / Indicators: Sore;Aching Pain Intervention(s): Monitored during session;Repositioned;Ice applied;Limited activity within patient's tolerance    Home Living                      Prior Function            PT Goals (current goals can now be found in the care plan section) Progress towards PT goals: Progressing toward goals  Frequency    7X/week      PT Plan Current plan remains appropriate    Co-evaluation              AM-PAC PT "6 Clicks" Mobility   Outcome Measure  Help needed turning from your back to your side while in a flat bed without using bedrails?: A Little Help needed moving from lying on your back to sitting on the side of a flat bed without using bedrails?: A Little Help needed moving to and from a bed to a chair (including a wheelchair)?: A Little Help needed standing up from a chair using your arms (e.g., wheelchair or bedside chair)?: A Little Help needed to walk in hospital room?: A Little Help needed climbing 3-5 steps with a railing? : A  Little 6 Click Score: 18    End of Session Equipment Utilized During Treatment: Gait belt;Left knee immobilizer Activity Tolerance: Patient limited by fatigue Patient left: with call bell/phone within reach;in bed;with family/visitor present   PT Visit Diagnosis: Other abnormalities of gait and mobility (R26.89);Difficulty in walking, not elsewhere classified (R26.2)     Time: 8469-6295 PT Time Calculation (min) (ACUTE ONLY): 23 min  Charges:  $Gait Training: 8-22 mins                      Carmelia Bake, PT, DPT Acute Rehabilitation Services Office: [phone] Pager: [phone]  Trena Platt 08/28/2018, 4:19 PM

## 2018-08-29 ENCOUNTER — Encounter (HOSPITAL_COMMUNITY): Payer: Self-pay | Admitting: Orthopedic Surgery

## 2018-08-29 DIAGNOSIS — M1712 Unilateral primary osteoarthritis, left knee: Secondary | ICD-10-CM | POA: Diagnosis not present

## 2018-08-29 LAB — CBC
HCT: 37.4 % (ref 36.0–46.0)
Hemoglobin: 11.8 g/dL — ABNORMAL LOW (ref 12.0–15.0)
MCH: 30.4 pg (ref 26.0–34.0)
MCHC: 31.6 g/dL (ref 30.0–36.0)
MCV: 96.4 fL (ref 80.0–100.0)
Platelets: 320 10*3/uL (ref 150–400)
RBC: 3.88 MIL/uL (ref 3.87–5.11)
RDW: 12.5 % (ref 11.5–15.5)
WBC: 8.5 10*3/uL (ref 4.0–10.5)
nRBC: 0 % (ref 0.0–0.2)

## 2018-08-29 NOTE — Progress Notes (Signed)
Physical Therapy Treatment Patient Details Name: [name] MRN: [id number] DOB: 07-13-1940 Today's Date: 08/29/2018    History of Present Illness 78 yo female s/p L TKR on 08/26/18. PMH includes lung cancer, anxiety, asthma, depression, GERD, OA, osteopenia, L ORIF of wrist    PT Comments    Pt ambulated in hallway and practiced one step with RW.  Pt's mobility and cognition improved this morning and pt anticipates d/c home today.  ALso performed LE exercises and provided HEP handout.  Pt had no further questions.   Follow Up Recommendations  Home health PT;Follow surgeon's recommendation for DC plan and follow-up therapies;Supervision for mobility/OOB     Equipment Recommendations  Rolling walker with 5" wheels    Recommendations for Other Services       Precautions / Restrictions Precautions Precautions: Fall Required Braces or Orthoses: Knee Immobilizer - Left Knee Immobilizer - Left: On except when in CPM Restrictions Other Position/Activity Restrictions: WBAT     Mobility  Bed Mobility Overal bed mobility: Needs Assistance Bed Mobility: Supine to Sit     Supine to sit: Min guard     General bed mobility comments: verbal cues for technique and self assist  Transfers Overall transfer level: Needs assistance Equipment used: Rolling walker (2 wheeled) Transfers: Sit to/from Stand Sit to Stand: Min guard         General transfer comment: verbal cues for UE and LE positioning  Ambulation/Gait Ambulation/Gait assistance: Min guard Gait Distance (Feet): 70 Feet Assistive device: Rolling walker (2 wheeled) Gait Pattern/deviations: Step-to pattern;Decreased stance time - left;Decreased weight shift to left;Antalgic     General Gait Details: verbal cues for sequence, RW positioning, step length, posture; improved performance and gait speed today compared to yesterday   Stairs Stairs: Yes Stairs assistance: Min guard Stair Management: Step to  pattern;Forwards;With walker Number of Stairs: 1 General stair comments: verbal cues for sequence, RW positioning, safety   Wheelchair Mobility    Modified Rankin (Stroke Patients Only)       Balance                                            Cognition Arousal/Alertness: Awake/alert Behavior During Therapy: WFL for tasks assessed/performed Overall Cognitive Status: Within Functional Limits for tasks assessed                                        Exercises Total Joint Exercises Ankle Circles/Pumps: AROM;Both;20 reps;Supine Quad Sets: AROM;Both;10 reps;Supine Short Arc Quad: AAROM;10 reps;Left Heel Slides: AAROM;Left;Supine;10 reps Hip ABduction/ADduction: AAROM;10 reps;Left Straight Leg Raises: AAROM;10 reps;Left    General Comments        Pertinent Vitals/Pain Pain Assessment: 0-10 Pain Score: 5  Pain Location: L knee  Pain Descriptors / Indicators: Sore;Aching Pain Intervention(s): Limited activity within patient's tolerance;Monitored during session;Repositioned    Home Living                      Prior Function            PT Goals (current goals can now be found in the care plan section) Progress towards PT goals: Progressing toward goals    Frequency    7X/week      PT Plan Current plan remains appropriate  Co-evaluation              AM-PAC PT "6 Clicks" Mobility   Outcome Measure  Help needed turning from your back to your side while in a flat bed without using bedrails?: A Little Help needed moving from lying on your back to sitting on the side of a flat bed without using bedrails?: A Little Help needed moving to and from a bed to a chair (including a wheelchair)?: A Little Help needed standing up from a chair using your arms (e.g., wheelchair or bedside chair)?: A Little Help needed to walk in hospital room?: A Little Help needed climbing 3-5 steps with a railing? : A Little 6 Click  Score: 18    End of Session Equipment Utilized During Treatment: Gait belt;Left knee immobilizer Activity Tolerance: Patient tolerated treatment well Patient left: with call bell/phone within reach;in chair;with chair alarm set   PT Visit Diagnosis: Other abnormalities of gait and mobility (R26.89);Difficulty in walking, not elsewhere classified (R26.2)     Time: 1610-9604 PT Time Calculation (min) (ACUTE ONLY): 27 min  Charges:  $Gait Training: 8-22 mins $Therapeutic Exercise: 8-22 mins                    Carmelia Bake, PT, DPT Acute Rehabilitation Services Office: [phone] Pager: [phone]  Trena Platt 08/29/2018, 1:40 PM

## 2018-08-29 NOTE — Discharge Summary (Signed)
Orthopedic Discharge Summary        Physician Discharge Summary  Patient ID: [name] MRN: [id number] DOB/AGE: January 10, 1941 78 y.o.  Admit date: 08/26/2018 Discharge date: 08/29/2018   Procedures:  Procedure(s) (LRB): TOTAL KNEE ARTHROPLASTY (Left)  Attending Physician:  Dr. Esmond Plants  Admission Diagnoses:   Left knee end stage osteoarthritis  Discharge Diagnoses:  Left knee end stage osteoarthritis   Past Medical History:  Diagnosis Date  . Adenocarcinoma of lung, stage 1 ([place])    Status post right lower lobectomy August 2015  . Anxiety   . Asthma   . Atrophic vaginitis   . Chronic iritis of left eye    Pupil stays dilated; nonreactive  . Colon polyp   . Contact lens/glasses fitting    wears contacts or glasses  . Depression   . Depression with anxiety   . Environmental allergies   . GERD (gastroesophageal reflux disease)   . Hypertriglyceridemia   . Kidney stones   . OA (osteoarthritis)   . Osteopenia   . PONV (postoperative nausea and vomiting)     PCP: Hulan Fess, MD   Discharged Condition: good  Hospital Course:  Patient underwent the above stated procedure on 08/26/2018. Patient tolerated the procedure well and brought to the recovery room in good condition and subsequently to the floor. Patient had an uncomplicated hospital course and was stable for discharge.   Disposition: Discharge disposition: 01-Home or Self Care      with follow up in 2 weeks   Follow-up Information    Netta Cedars, MD. Call in 2 weeks.   Specialty:  Orthopedic Surgery Why:  [phone] Contact information: [address] [place] [address] [phone]        Home, [facility] Follow up.   Specialty:  [place] Why:  [place] will call to arrange initial appointment Contact information: [place] [address] [phone]           Discharge Instructions     Call MD / Call 911   Complete by:  As directed    If you experience chest pain or shortness of breath, CALL 911 and be transported to the hospital emergency room.  If you develope a fever above 101 F, pus (white drainage) or increased drainage or redness at the wound, or calf pain, call your surgeon's office.   Constipation Prevention   Complete by:  As directed    Drink plenty of fluids.  Prune juice may be helpful.  You may use a stool softener, such as Colace (over the counter) 100 mg twice a day.  Use MiraLax (over the counter) for constipation as needed.   Diet - low sodium heart healthy   Complete by:  As directed    Increase activity slowly as tolerated   Complete by:  As directed       Allergies as of 08/29/2018      Reactions   Grass Extracts [gramineae Pollens] Other (See Comments)   Unknown   Nsaids Other (See Comments)   Elevated kidney fx   Prednisone Nausea And Vomiting, Other (See Comments)   Can take Prednisone injection but not orally      Medication List    STOP taking these medications   FLUTTER Devi     TAKE these medications   albuterol 108 (90 Base) MCG/ACT inhaler Commonly known as:  PROAIR HFA Inhale 2 puffs into the lungs every  6 (six) hours as needed for wheezing or shortness of breath.   amLODipine 5 MG tablet Commonly known as:  NORVASC Take 5 mg by mouth daily after breakfast.   aspirin 81 MG chewable tablet Commonly known as:  ASPIRIN CHILDRENS Chew 1 tablet (81 mg total) by mouth 2 (two) times daily.   budesonide-formoterol 80-4.5 MCG/ACT inhaler Commonly known as:  SYMBICORT Inhale 2 puffs into the lungs 2 (two) times daily. What changed:  when to take this   CALCIUM 1200+D3 PO Take 1 tablet by mouth daily.   CENTRUM SILVER PO Take 1 tablet by mouth daily.   chlorpheniramine 4 MG tablet Commonly known as:  CHLOR-TRIMETON Take 4 mg by mouth at bedtime.   dorzolamide-timolol 22.3-6.8 MG/ML ophthalmic solution Commonly known as:   COSOPT Place 1 drop into both eyes 2 (two) times daily.   famotidine 20 MG tablet Commonly known as:  PEPCID Take 20 mg by mouth at bedtime.   fenofibrate 160 MG tablet Take 160 mg by mouth daily.   FLUoxetine 40 MG capsule Commonly known as:  PROZAC Take 40 mg by mouth daily.   gabapentin 100 MG capsule Commonly known as:  NEURONTIN TAKE 1 CAPSULE (100 MG TOTAL) BY MOUTH 3 (THREE) TIMES DAILY. ONE THREE TIMES DAILY What changed:  additional instructions   losartan 100 MG tablet Commonly known as:  COZAAR Take 1 tablet (100 mg total) by mouth daily.   methocarbamol 500 MG tablet Commonly known as:  ROBAXIN Take 1 tablet (500 mg total) by mouth 3 (three) times daily as needed.   montelukast 10 MG tablet Commonly known as:  SINGULAIR Take 1 tablet (10 mg total) by mouth at bedtime.   omeprazole 40 MG capsule Commonly known as:  PRILOSEC Take 40 mg by mouth daily.   oxyCODONE-acetaminophen 5-325 MG tablet Commonly known as:  PERCOCET Take 1-2 tablets by mouth every 4 (four) hours as needed for severe pain.   solifenacin 10 MG tablet Commonly known as:  VESICARE Take 10 mg by mouth daily.   traZODone 50 MG tablet Commonly known as:  DESYREL Take 50 mg by mouth at bedtime.   vitamin C 100 MG tablet Take 100 mg by mouth daily.   VITAMIN D3 PO Take 1 capsule by mouth daily.            Durable Medical Equipment  (From admission, onward)         Start     Ordered   08/27/18 1230  For home use only DME 3 n 1  Once     08/27/18 1230   08/26/18 1317  DME Walker rolling  Once    Question:  Patient needs a walker to treat with the following condition  Answer:  Status post total knee replacement, left   08/26/18 1316            Signed: Ventura Bruns 08/29/2018, 9:29 AM  [facility] is now [address] [place], [place], [place] [address] Phone: [place]

## 2018-08-29 NOTE — Progress Notes (Signed)
   Subjective: 3 Days Post-Op Procedure(s) (LRB): TOTAL KNEE ARTHROPLASTY (Left)  Pt doing well Therapy going well Ready for d/c later today Denies any new symptoms or issues Patient reports pain as mild.  Objective:   VITALS:   Vitals:   08/28/18 2152 08/29/18 0552  BP: (!) 159/77 134/78  Pulse: 70 71  Resp: 16 16  Temp: 98.6 F (37 C) 97.7 F (36.5 C)  SpO2: 97% 100%    Left knee incision healing well nv intact distally No rashes or edema  Good rom  LABS Recent Labs    08/27/18 0415 08/28/18 0437 08/29/18 0448  HGB 11.3* 11.7* 11.8*  HCT 37.2 37.8 37.4  WBC 9.9 9.2 8.5  PLT 289 307 320    Recent Labs    08/27/18 0415  NA 137  K 4.5  BUN 18  CREATININE 1.10*  GLUCOSE 122*     Assessment/Plan: 3 Days Post-Op Procedure(s) (LRB): TOTAL KNEE ARTHROPLASTY (Left) Continue PT/OT D/c today to home F/u in 2 weeks    [facility] [place], [place] is now [place] [employer] [address] [place], [place], [place] [address] Phone: [phone] [email or web address] Facebook  [employer]

## 2018-08-29 NOTE — Care Management Obs Status (Signed)
[facility] NOTIFICATION   Patient Details  Name: [name] MRN: [id number] Date of Birth: [birth year]   Medicare Observation Status Notification Given:  Yes    Leeroy Cha, RN 08/29/2018, 10:04 AM

## 2018-09-08 ENCOUNTER — Encounter (INDEPENDENT_AMBULATORY_CARE_PROVIDER_SITE_OTHER): Payer: Medicare Other | Admitting: Ophthalmology

## 2018-09-13 ENCOUNTER — Encounter (INDEPENDENT_AMBULATORY_CARE_PROVIDER_SITE_OTHER): Payer: Medicare Other | Admitting: Ophthalmology

## 2018-09-13 DIAGNOSIS — H43813 Vitreous degeneration, bilateral: Secondary | ICD-10-CM | POA: Diagnosis not present

## 2018-09-13 DIAGNOSIS — H34831 Tributary (branch) retinal vein occlusion, right eye, with macular edema: Secondary | ICD-10-CM

## 2018-09-13 DIAGNOSIS — H35033 Hypertensive retinopathy, bilateral: Secondary | ICD-10-CM | POA: Diagnosis not present

## 2018-09-13 DIAGNOSIS — H353132 Nonexudative age-related macular degeneration, bilateral, intermediate dry stage: Secondary | ICD-10-CM | POA: Diagnosis not present

## 2018-09-15 ENCOUNTER — Other Ambulatory Visit: Payer: Self-pay

## 2018-09-15 ENCOUNTER — Ambulatory Visit: Payer: Medicare Other | Attending: Orthopedic Surgery | Admitting: Physical Therapy

## 2018-09-15 ENCOUNTER — Encounter: Payer: Self-pay | Admitting: Physical Therapy

## 2018-09-15 DIAGNOSIS — M25662 Stiffness of left knee, not elsewhere classified: Secondary | ICD-10-CM | POA: Insufficient documentation

## 2018-09-15 DIAGNOSIS — M6281 Muscle weakness (generalized): Secondary | ICD-10-CM

## 2018-09-15 DIAGNOSIS — R262 Difficulty in walking, not elsewhere classified: Secondary | ICD-10-CM | POA: Insufficient documentation

## 2018-09-15 DIAGNOSIS — R600 Localized edema: Secondary | ICD-10-CM | POA: Diagnosis present

## 2018-09-15 DIAGNOSIS — M25562 Pain in left knee: Secondary | ICD-10-CM | POA: Diagnosis not present
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
## 2018-09-19 ENCOUNTER — Ambulatory Visit: Payer: Medicare Other | Admitting: Physical Therapy

## 2018-09-19 ENCOUNTER — Other Ambulatory Visit: Payer: Self-pay

## 2018-09-19 DIAGNOSIS — M25562 Pain in left knee: Secondary | ICD-10-CM | POA: Diagnosis not present

## 2018-09-19 DIAGNOSIS — M25662 Stiffness of left knee, not elsewhere classified: Secondary | ICD-10-CM

## 2018-09-19 DIAGNOSIS — M6281 Muscle weakness (generalized): Secondary | ICD-10-CM

## 2018-09-19 DIAGNOSIS — R600 Localized edema: Secondary | ICD-10-CM

## 2018-09-19 DIAGNOSIS — R262 Difficulty in walking, not elsewhere classified: Secondary | ICD-10-CM

## 2018-09-19 NOTE — Therapy (Addendum)
[facility] [place] [place], [place], [address] Phone: [phone]   Fax:  [phone]  Physical Therapy Treatment  Patient Details  Name: [name] MRN: [id number] Date of Birth: 04-28-1941 Referring Provider (PT): Netta Cedars, MD   Encounter Date: 09/19/2018  PT End of Session - 09/19/18 1424    Visit Number  2    Number of Visits  17    PT Start Time  1157    PT Stop Time  1526    PT Time Calculation (min)  62 min    Equipment Utilized During Treatment  Gait belt    Activity Tolerance  Patient tolerated treatment well;Patient limited by pain    Behavior During Therapy  [facility] for tasks assessed/performed       Past Medical History:  Diagnosis Date  . Adenocarcinoma of lung, stage 1 ([place])    Status post right lower lobectomy August 2015  . Anxiety   . Asthma   . Atrophic vaginitis   . Chronic iritis of left eye    Pupil stays dilated; nonreactive  . Colon polyp   . Contact lens/glasses fitting    wears contacts or glasses  . Depression   . Depression with anxiety   . Environmental allergies   . GERD (gastroesophageal reflux disease)   . Hypertriglyceridemia   . Kidney stones   . OA (osteoarthritis)   . Osteopenia   . PONV (postoperative nausea and vomiting)     Past Surgical History:  Procedure Laterality Date  . APPENDECTOMY    . COLONOSCOPY    . EYE SURGERY Left    cataract removal  . ORIF WRIST FRACTURE Left 08/04/2013   Procedure: OPEN REDUCTION INTERNAL FIXATION (ORIF) LEFT DISTAL RADIUS WRIST FRACTURE;  Surgeon: Wynonia Sours, MD;  Location: [facility];  Service: Orthopedics;  Laterality: Left;  . PARTIAL HYSTERECTOMY    . TOTAL KNEE ARTHROPLASTY Left 08/26/2018   Procedure: TOTAL KNEE ARTHROPLASTY;  Surgeon: Netta Cedars, MD;  Location: [facility];  Service: Orthopedics;  Laterality: Left;  Marland Kitchen VIDEO ASSISTED THORACOSCOPY (VATS)/WEDGE RESECTION Right 02/12/2014    Procedure: RIGHT VIDEO ASSISTED THORACOSCOPY RIGHT LOWER LOBE LUNG /WEDGE RESECTION,RIGHT THORACOTOMY WITH RIGHT LOWER LOBE LOBECTOMY & NODE DISSECTION;  Surgeon: Melrose Nakayama, MD;  Location: [facility];  Service: Thoracic;  Laterality: Right;  Marland Kitchen VIDEO BRONCHOSCOPY N/A 02/12/2014   Procedure: VIDEO BRONCHOSCOPY;  Surgeon: Melrose Nakayama, MD;  Location: [facility];  Service: Thoracic;  Laterality: N/A;    There were no vitals filed for this visit.  Subjective Assessment - 09/19/18 1424    Subjective  I'm a little sore today.    Pertinent History  lung CA w/ lobectony 2015, osteopenia, kidney stones, GERD, depression, anxiety, asthma, L wrist ORIF 2015    Limitations  Sitting;Lifting;Standing;Walking;House hold activities    How long can you sit comfortably?  couple minutes    How long can you stand comfortably?  5 min    How long can you walk comfortably?  0 min    Diagnostic tests  none recent    Patient Stated Goals  "get back to using my cane"    Currently in Pain?  Yes    Pain Score  3     Pain Location  Knee    Pain Orientation  Left    Pain Descriptors / Indicators  Aching    Pain Type  Surgical pain  [place] Adult PT Treatment/Exercise - 09/19/18 0001      Exercises   Exercises  Knee/Hip      Knee/Hip Exercises: Aerobic   Recumbent Bike  L0 seat 6 to 5     Nustep  L3 x 5 min seat 8 to 7      Knee/Hip Exercises: Standing   Hip Abduction  Stengthening;Both;1 set;10 reps    Forward Step Up  Left;1 set;10 reps;Hand Hold: 2;Step Height: 4"    Forward Step Up Limitations  stopped due to pain    Rocker Board  1 minute   then 30 sec hold    Gait Training  With SPC and gait belt 130 feet      Knee/Hip Exercises: Seated   Heel Slides  Left;1 set;5 reps      Knee/Hip Exercises: Supine   Quad Sets  Strengthening;Left;10 reps    Short Arc Quad Sets  Strengthening;Left;10 reps    Heel Slides  Strengthening;5 reps   stopped due to pain    Bridges  Strengthening;Both;2 sets;5 reps    Straight Leg Raises  Strengthening;Left;10 reps;1 set   then 1 x 5     Modalities   Modalities  Vasopneumatic      Vasopneumatic   Number Minutes Vasopneumatic   15 minutes    Vasopnuematic Location   Knee    Vasopneumatic Pressure  Medium    Vasopneumatic Temperature   34               PT Short Term Goals - 09/15/18 1624      PT SHORT TERM GOAL #1   Title  Patient to be independent with initial HEP.    Time  4    Period  Weeks    Status  New    Target Date  10/13/18        PT Long Term Goals - 09/15/18 1624      PT LONG TERM GOAL #1   Title  Patient to be independent with advanced HEP.    Time  8    Period  Weeks    Status  New    Target Date  11/10/18      PT LONG TERM GOAL #2   Title  Patient to demonstrate >=4+/5 strength in B LEs.    Time  8    Period  Weeks    Status  New    Target Date  11/10/18      PT LONG TERM GOAL #3   Title  Patient to demonstrate L knee AROM/PROM 0-120 degrees.     Time  8    Period  Weeks    Status  New    Target Date  11/10/18      PT LONG TERM GOAL #4   Title  Patient to demonstrate symmetrical weight shift, step length, and knee flexion with ambulation with SPC/Hurrycane.     Time  8    Period  Weeks    Status  New    Target Date  11/10/18      PT LONG TERM GOAL #5   Title  Patient to report tolerance of 1 hour of standing/walking without pain limiiting.     Time  8    Period  Weeks    Status  New    Target Date  11/10/18            Plan - 09/19/18 1619    Clinical Impression Statement  Patient tolerated TE  fairly well today although she forgot to take any pain meds, so was limited somewhat by pain. She had some LOB with amb using SPC. She has significant Rt hip weakness which was noticeable with gait training and standing hip ABD.     PT Treatment/Interventions  ADLs/Self Care Home Management;Cryotherapy;Electrical Stimulation;Functional mobility  training;Stair training;Gait training;Ultrasound;Moist Heat;Therapeutic activities;Therapeutic exercise;Balance training;Neuromuscular re-education;Patient/family education;Passive range of motion;Scar mobilization;Manual techniques;Dry needling;Energy conservation;Splinting;Taping;Vasopneumatic Device    PT Next Visit Plan  Continue strengthening, ROM, gait/balance       Patient will benefit from skilled therapeutic intervention in order to improve the following deficits and impairments:  Hypomobility, Increased edema, Decreased scar mobility, Decreased activity tolerance, Decreased strength, Pain, Difficulty walking, Decreased balance, Decreased range of motion, Improper body mechanics, Postural dysfunction, Impaired flexibility  Visit Diagnosis: Acute pain of left knee  Stiffness of left knee, not elsewhere classified  Muscle weakness (generalized)  Difficulty in walking, not elsewhere classified  Localized edema     Problem List Patient Active Problem List   Diagnosis Date Noted  . 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  . 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 Essential hypertension 02/23/2017  . Adenocarcinoma of lung, stage 1 ([place]) 05/08/2014  . S/P lobectomy of lung 02/12/2014  . Asthma 01/30/2014  .  Multiple pulmonary nodules, largest R mid lung 06/29/2013  . 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  We will D/C Madelyn Flavors PT 09/19/2018, 4:24 PM  [place], [place], [address] Phone: [phone]   Fax:  [phone]  Name: [name] MRN: [id number] Date of Birth: 13-Mar-1941

## 2018-09-22 ENCOUNTER — Ambulatory Visit: Payer: Medicare Other | Admitting: Physical Therapy

## 2018-09-22 ENCOUNTER — Telehealth: Payer: Self-pay | Admitting: Physical Therapy

## 2018-09-22 NOTE — Telephone Encounter (Signed)
Patient has cancelled her PT appts under her surgeon's recommendations.  Patient requested home exercises to complete while away from therapy.  Called patient to let her know an email had been sent to her (confirmed email address) with instructions on how to access her HEP online.  Information included link for MedBridge access.   Patient verbalized understanding.  Informed patient that a paper copy would be mailed to her address as well.   Patient stated she "over did it" yesterday and her leg has increased swelling.  I recommended she ice, elevate, and compress her lower leg (with compression sock issued by surgeon) to assist with swelling reduction. Patient verbalized understanding.   Access Code: [email or web address]  URL: [email or web address]  Date: 09/22/2018  Prepared by: Kerin Perna   Program Notes  Please continue to use ice on your knee while elevating leg above your heart. Keep ice on 15 minutes. You can repeat this a few times each day. Keep your compression sock on if your ankle/ lower leg is still swollen; this will help reduce the swelling.   Exercises  Seated Knee Flexion Extension AAROM with Overpressure - 10 reps - 1 sets - 5-10 hold - 3x daily - 7x weekly  Sit to Stand with Counter Support - 10 reps - 1 sets - 2-3x daily - 7x weekly  Seated Long Arc Quad - 10 reps - 2 sets - 2-3x daily - 7x weekly  Standing Hip Abduction with Counter Support - 10 reps - 1 sets - 2x daily - 7x weekly  Gastroc Stretch on Wall - 3 reps - 1 sets - 30 seconds hold - 2-3x daily - 7x weekly  Seated Ankle Pumps - 10 reps - 1 sets - 3x daily - 7x weekly  Long Sitting Quad Set - 10 reps - 1 sets - 5-10 seconds hold - 2-3x daily - 7x weekly  Supine Active Straight Leg Raise - 5-10 reps - 1 sets - 2x daily - 7x weekly  Hooklying Hamstring Stretch with Strap - 3 reps - 1 sets - 30 seconds hold - 2x daily - 7x weekly    Kerin Perna, PTA 09/22/18 12:22 PM

## 2018-10-03 ENCOUNTER — Encounter: Payer: Medicare Other | Admitting: Adult Health

## 2018-10-04 ENCOUNTER — Other Ambulatory Visit: Payer: Medicare Other

## 2018-11-03 ENCOUNTER — Other Ambulatory Visit: Payer: Self-pay | Admitting: Internal Medicine

## 2018-11-14 ENCOUNTER — Ambulatory Visit (INDEPENDENT_AMBULATORY_CARE_PROVIDER_SITE_OTHER): Payer: Medicare Other | Admitting: Adult Health

## 2018-11-14 ENCOUNTER — Encounter: Payer: Self-pay | Admitting: Adult Health

## 2018-11-14 ENCOUNTER — Other Ambulatory Visit: Payer: Self-pay

## 2018-11-14 DIAGNOSIS — J309 Allergic rhinitis, unspecified: Secondary | ICD-10-CM | POA: Diagnosis not present

## 2018-11-14 DIAGNOSIS — R053 Chronic cough: Secondary | ICD-10-CM

## 2018-11-14 DIAGNOSIS — J4530 Mild persistent asthma, uncomplicated: Secondary | ICD-10-CM | POA: Diagnosis not present

## 2018-11-14 DIAGNOSIS — R05 Cough: Secondary | ICD-10-CM

## 2018-11-14 NOTE — Patient Instructions (Addendum)
May stop Pepcid .  Decrease Gabapentin 100mg  Twice daily   Continue on Symbicort 2 puffs Twice daily  , rinse after use.  Continue on Prilosec daily before meal  GERD diet.  Continue on Chlortrimeton 4mg  At bedtime   Follow up with Dr. Melvyn Novas  Or Jatavis Malek NP in 6 weeks and As needed   Please contact office for sooner follow up if symptoms do not improve or worsen or seek emergency care

## 2018-11-14 NOTE — Progress Notes (Signed)
Virtual Visit via Telephone Note  I connected with [name] on 11/14/18 at  1:30 PM EDT by telephone and verified that I am speaking with the correct person using two identifiers.  Location: Patient: Home  Provider: Office    I discussed the limitations, risks, security and privacy concerns of performing an evaluation and management service by telephone and the availability of in person appointments. I also discussed with the patient that there may be a patient responsible charge related to this service. The patient expressed understanding and agreed to proceed.   History of Present Illness: 78 yo female former smoker followed for chronic cough , Lung cancer -Adenocarcinoma s/p S/p Right lower lobectomy August 2015.   Today's televisit is for a follow up for Asthma, Chronic cough Lung Cancer history .  Today is a 3 month follow up . Says overall breathing is doing good. Remains on Symbicort 80 Twice daily  . No flare of cough or wheezing.  She is requesting to decrease the amount meds she is taking , would like to stop Pepcid . Says reflux is doing well on prilosec daily.   Says allergies have more active earlier this season, she had increased sneezing , drippy nose and cough when she went outside. Says it is getting better. She is on Singulair and chlortrimeton.  No fever , discolored mucus or chest pain.  Feels the Chlortrimeton really helps her a lot .   Had total knee  Replacement in February . Says it went very well. Pain is much better. Did home PT . Moving around more now.   Says she is staying home and practicing social distancing with COVID 19 precautions.    Observations/Objective:  Spirometry 02/23/13 wnl  - 12/29/2014Sinus CT >Mild chronic sinus disease as described. No acute findings. Left-to-right nasal septal deviation of 3 mm. -med calendar 08/08/13 , redone 11/27/2017and 02/22/2017 - Eos 4% 10/13/2013>rec singulair daily  - FENO 05/12/2016= 24 on  singulair  - Allergy profile 05/12/2016>IgE 47 pos RAST ragweed /oak/ birch -CT chest 02/09/2017 no evidence of disease recurrence, no findings of suspicious metastatic disease. Numerous chronic subcentimeter solid pulmonary nodules scattered. -CT chest 02/2018 s/p RLL lobectomy , stable nodules .    Assessment and Plan: Asthma - Stable   Chronic Cough -stable  Trial of decreasing gabapentin   GERD - stable  Trial off Pepcid   Plan  Patient Instructions  May stop Pepcid .  Decrease Gabapentin 100mg  Twice daily   Continue on Symbicort 2 puffs Twice daily  , rinse after use.  Continue on Prilosec daily before meal  GERD diet.  Continue on Chlortrimeton 4mg  At bedtime   Follow up with Dr. Melvyn Novas  Or Mikhail Hallenbeck NP in 6 weeks and As needed   Please contact office for sooner follow up if symptoms do not improve or worsen or seek emergency care        Follow Up Instructions: Follow up with Dr. Melvyn Novas  In 6 weeks and As needed   Please contact office for sooner follow up if symptoms do not improve or worsen or seek emergency care      I discussed the assessment and treatment plan with the patient. The patient was provided an opportunity to ask questions and all were answered. The patient agreed with the plan and demonstrated an understanding of the instructions.   The patient was advised to call back or seek an in-person evaluation if the symptoms worsen or if the condition fails to  improve as anticipated.  I provided 25  minutes of non-face-to-face time during this encounter.   Rexene Edison, NP

## 2018-11-15 ENCOUNTER — Encounter (INDEPENDENT_AMBULATORY_CARE_PROVIDER_SITE_OTHER): Payer: Medicare Other | Admitting: Ophthalmology

## 2018-11-15 ENCOUNTER — Other Ambulatory Visit: Payer: Self-pay

## 2018-11-15 DIAGNOSIS — H353132 Nonexudative age-related macular degeneration, bilateral, intermediate dry stage: Secondary | ICD-10-CM | POA: Diagnosis not present

## 2018-11-15 DIAGNOSIS — I10 Essential (primary) hypertension: Secondary | ICD-10-CM | POA: Diagnosis not present

## 2018-11-15 DIAGNOSIS — H34831 Tributary (branch) retinal vein occlusion, right eye, with macular edema: Secondary | ICD-10-CM

## 2018-11-15 DIAGNOSIS — H43813 Vitreous degeneration, bilateral: Secondary | ICD-10-CM

## 2018-11-15 DIAGNOSIS — H35033 Hypertensive retinopathy, bilateral: Secondary | ICD-10-CM | POA: Diagnosis not present

## 2018-11-15 NOTE — Progress Notes (Signed)
Chart and office note reviewed in detail  > agree with a/p as outlined    

## 2018-12-20 ENCOUNTER — Other Ambulatory Visit: Payer: Self-pay

## 2018-12-20 ENCOUNTER — Encounter: Payer: Self-pay | Admitting: Adult Health

## 2018-12-20 ENCOUNTER — Ambulatory Visit: Payer: Medicare Other | Admitting: Adult Health

## 2018-12-20 DIAGNOSIS — R053 Chronic cough: Secondary | ICD-10-CM

## 2018-12-20 DIAGNOSIS — C3491 Malignant neoplasm of unspecified part of right bronchus or lung: Secondary | ICD-10-CM

## 2018-12-20 DIAGNOSIS — J4530 Mild persistent asthma, uncomplicated: Secondary | ICD-10-CM

## 2018-12-20 DIAGNOSIS — R05 Cough: Secondary | ICD-10-CM | POA: Diagnosis not present

## 2018-12-20 HISTORY — DX: Chronic cough: R05.3

## 2018-12-20 NOTE — Assessment & Plan Note (Signed)
Well-controlled on current regimen  Plan  Patient Instructions  Continue on Gabapentin 100mg  Three times a day  .  Continue on Symbicort 2 puffs Twice daily  , rinse after use.  Continue on Prilosec daily before meal  GERD diet.  Continue on Chlortrimeton 4mg  At bedtime   Plan for CT chest in August.  Follow up with Dr. Melvyn Novas  Or Ruchama Kubicek NP in 3  months  and As needed   Please contact office for sooner follow up if symptoms do not improve or worsen or seek emergency care

## 2018-12-20 NOTE — Progress Notes (Signed)
@Patient  ID: [name], female    DOB: 1941/04/25, 78 y.o.   MRN: [id number]  Chief Complaint  Patient presents with   Follow-up    Asthma     Referring provider: Hulan Fess, MD  HPI: 78 year old female former smoker followed for chronic cough, lung cancer (adenocarcinoma status post right lower lobectomy August 2015)   TEST/EVENTS :  Spirometry 02/23/13 wnl  - 12/29/2014Sinus CT >Mild chronic sinus disease as described. No acute findings. Left-to-right nasal septal deviation of 3 mm. -med calendar 08/08/13 , redone 11/27/2017and 02/22/2017 - Eos 4% 10/13/2013>rec singulair daily  - FENO 05/12/2016= 24 on singulair  - Allergy profile 05/12/2016>IgE 47 pos RAST ragweed /oak/ birch -CT chest 02/09/2017 no evidence of disease recurrence, no findings of suspicious metastatic disease. Numerous chronic subcentimeter solid pulmonary nodules scattered. -CT chest 02/2018 s/p RLL lobectomy , stable nodules .   12/20/2018 Follow up : Chronic Cough and Asthma  Patient presents for a one-month follow-up.  Patient was seen last visit was doing well with her chronic cough and asthma.  At that point she was recommended to stop her Pepcid and decrease gabapentin to twice daily.  She remains on Symbicort twice daily.  She is uses Prilosec once daily.  She is also on Chlor-Trimeton 4 mg at bedtime to help with any postnasal drip symptoms. Since last visit patient says she is doing well.  Stopped Gabapentin but cough came right back. When she takes Gabapentin cough is very well controlled.   Just got a dog, keeping her company, very happy.     Allergies  Allergen Reactions   Grass Extracts [Gramineae Pollens] Other (See Comments)    Unknown   Nsaids Other (See Comments)    Elevated kidney fx   Prednisone Nausea And Vomiting and Other (See Comments)    Can take Prednisone injection but not orally    Immunization History  Administered Date(s) Administered    Influenza Split 04/05/2013   Influenza, High Dose Seasonal PF 04/05/2016, 04/05/2018   Influenza-Unspecified 05/05/2017    Past Medical History:  Diagnosis Date   Adenocarcinoma of lung, stage 1 ([place])    Status post right lower lobectomy August 2015   Anxiety    Asthma    Atrophic vaginitis    Chronic iritis of left eye    Pupil stays dilated; nonreactive   Colon polyp    Contact lens/glasses fitting    wears contacts or glasses   Depression    Depression with anxiety    Environmental allergies    GERD (gastroesophageal reflux disease)    Hypertriglyceridemia    Kidney stones    OA (osteoarthritis)    Osteopenia    PONV (postoperative nausea and vomiting)     Tobacco History: Social History   Tobacco Use  Smoking Status Former Smoker   Packs/day: 0.75   Years: 20.00   Pack years: 15.00   Types: Cigarettes   Quit date: 07/06/1978   Years since quitting: 40.4  Smokeless Tobacco Never Used   Counseling given: Not Answered   Outpatient Medications Prior to Visit  Medication Sig Dispense Refill   albuterol (PROAIR HFA) 108 (90 BASE) MCG/ACT inhaler Inhale 2 puffs into the lungs every 6 (six) hours as needed for wheezing or shortness of breath. 1 Inhaler 4   amLODipine (NORVASC) 5 MG tablet Take 5 mg by mouth daily after breakfast.     Ascorbic Acid (VITAMIN C) 100 MG tablet Take 100 mg by mouth daily.  budesonide-formoterol (SYMBICORT) 80-4.5 MCG/ACT inhaler Inhale 2 puffs into the lungs 2 (two) times daily. (Patient taking differently: Inhale 2 puffs into the lungs daily. ) 1 Inhaler 5   Calcium-Magnesium-Vitamin D (CALCIUM 1200+D3 PO) Take 1 tablet by mouth daily.      chlorpheniramine (CHLOR-TRIMETON) 4 MG tablet Take 4 mg by mouth at bedtime.      Cholecalciferol (VITAMIN D3 PO) Take 1 capsule by mouth daily.     dorzolamide-timolol (COSOPT) 22.3-6.8 MG/ML ophthalmic solution Place 1 drop into both eyes 2 (two) times daily.      fenofibrate 160 MG tablet Take 160 mg by mouth daily.     FLUoxetine (PROZAC) 40 MG capsule Take 40 mg by mouth daily.      gabapentin (NEURONTIN) 100 MG capsule TAKE 1 CAPSULE (100 MG TOTAL) BY MOUTH 3 (THREE) TIMES DAILY. ONE THREE TIMES DAILY (Patient taking differently: Take 100 mg by mouth 3 (three) times daily. ) 270 capsule 1   losartan (COZAAR) 100 MG tablet Take 1 tablet (100 mg total) by mouth daily. 30 tablet 11   methocarbamol (ROBAXIN) 500 MG tablet Take 1 tablet (500 mg total) by mouth 3 (three) times daily as needed. 60 tablet 1   montelukast (SINGULAIR) 10 MG tablet Take 1 tablet (10 mg total) by mouth at bedtime. 90 tablet 0   Multiple Vitamins-Minerals (CENTRUM SILVER PO) Take 1 tablet by mouth daily.     omeprazole (PRILOSEC) 40 MG capsule Take 40 mg by mouth daily.     solifenacin (VESICARE) 10 MG tablet Take 10 mg by mouth daily.     traZODone (DESYREL) 50 MG tablet Take 50 mg by mouth at bedtime.     famotidine (PEPCID) 20 MG tablet Take 20 mg by mouth at bedtime.      aspirin (ASPIRIN CHILDRENS) 81 MG chewable tablet Chew 1 tablet (81 mg total) by mouth 2 (two) times daily. (Patient not taking: Reported on 12/20/2018) 60 tablet 0   oxyCODONE-acetaminophen (PERCOCET) 5-325 MG tablet Take 1-2 tablets by mouth every 4 (four) hours as needed for severe pain. (Patient not taking: Reported on 11/14/2018) 30 tablet 0   No facility-administered medications prior to visit.      Review of Systems:   Constitutional:   No  weight loss, night sweats,  Fevers, chills, fatigue, or  lassitude.  HEENT:   No headaches,  Difficulty swallowing,  Tooth/dental problems, or  Sore throat,                No sneezing, itching, ear ache, nasal congestion, post nasal drip,   CV:  No chest pain,  Orthopnea, PND, swelling in lower extremities, anasarca, dizziness, palpitations, syncope.   GI  No heartburn, indigestion, abdominal pain, nausea, vomiting, diarrhea, change in bowel habits,  loss of appetite, bloody stools.   Resp:    No coughing up of blood.  No change in color of mucus.  No wheezing.  No chest wall deformity  Skin: no rash or lesions.  GU: no dysuria, change in color of urine, no urgency or frequency.  No flank pain, no hematuria   MS:  No joint pain or swelling.  No decreased range of motion.  No back pain.    Physical Exam  BP 124/78 (BP Location: Left Arm, Cuff Size: Normal)    Pulse 77    Temp 97.8 F (36.6 C) (Oral)    Ht 5\' 3"  (1.6 m)    Wt 200 lb 9.6 oz (91 kg)  SpO2 96%    BMI 35.53 kg/m   GEN: A/Ox3; pleasant , NAD, well nourished    HEENT:  [place]/AT,  EACs-clear, TMs-wnl, NOSE-clear, THROAT-clear, no lesions, no postnasal drip or exudate noted.   NECK:  Supple w/ fair ROM; no JVD; normal carotid impulses w/o bruits; no thyromegaly or nodules palpated; no lymphadenopathy.    RESP  Clear  P & A; w/o, wheezes/ rales/ or rhonchi. no accessory muscle use, no dullness to percussion  CARD:  RRR, no m/r/g, no peripheral edema, pulses intact, no cyanosis or clubbing.  GI:   Soft & nt; nml bowel sounds; no organomegaly or masses detected.   Musco: Warm bil, no deformities or joint swelling noted.   Neuro: alert, no focal deficits noted.    Skin: Warm, no lesions or rashes    Lab Results:  CBC    Component Value Date/Time   WBC 8.5 08/29/2018 0448   RBC 3.88 08/29/2018 0448   HGB 11.8 (L) 08/29/2018 0448   HCT 37.4 08/29/2018 0448   PLT 320 08/29/2018 0448   MCV 96.4 08/29/2018 0448   MCH 30.4 08/29/2018 0448   MCHC 31.6 08/29/2018 0448   RDW 12.5 08/29/2018 0448   LYMPHSABS 1.7 10/13/2013 1633   MONOABS 0.7 10/13/2013 1633   EOSABS 0.3 10/13/2013 1633   BASOSABS 0.0 10/13/2013 1633    BMET    Component Value Date/Time   NA 137 08/27/2018 0415   K 4.5 08/27/2018 0415   CL 106 08/27/2018 0415   CO2 25 08/27/2018 0415   GLUCOSE 122 (H) 08/27/2018 0415   BUN 18 08/27/2018 0415   CREATININE 1.10 (H) 08/27/2018 0415    CALCIUM 9.0 08/27/2018 0415   GFRNONAA 48 (L) 08/27/2018 0415   GFRAA 56 (L) 08/27/2018 0415    BNP No results found for: BNP  ProBNP No results found for: PROBNP  Imaging: No results found.    PFT Results Latest Ref Rng & Units 05/18/2017 02/08/2014  FVC-Pre L 1.83 2.17  FVC-Predicted Pre % 69 79  FVC-Post L 1.88 1.99  FVC-Predicted Post % 71 72  Pre FEV1/FVC % % 62 66  Post FEV1/FCV % % 69 72  FEV1-Pre L 1.13 1.43  FEV1-Predicted Pre % 58 69  FEV1-Post L 1.29 1.42  DLCO UNC% % 70 56  DLCO COR %Predicted % 103 89  TLC L 4.98 4.48  TLC % Predicted % 101 91  RV % Predicted % 122 101    Lab Results  Component Value Date   NITRICOXIDE 24 05/18/2017        Assessment & Plan:   Asthma Well-controlled on current regimen  Plan  Patient Instructions  Continue on Gabapentin 100mg  Three times a day  .  Continue on Symbicort 2 puffs Twice daily  , rinse after use.  Continue on Prilosec daily before meal  GERD diet.  Continue on Chlortrimeton 4mg  At bedtime   Plan for CT chest in August.  Follow up with Dr. Melvyn Novas  Or Maya Arcand NP in 3  months  and As needed   Please contact office for sooner follow up if symptoms do not improve or worsen or seek emergency care       Adenocarcinoma of lung, stage 1 Follow-up CT chest in August  Chronic cough Will continue on current regimen.  Doing well on gabapentin.  Plan  Patient Instructions  Continue on Gabapentin 100mg  Three times a day  .  Continue on Symbicort 2 puffs Twice daily  ,  rinse after use.  Continue on Prilosec daily before meal  GERD diet.  Continue on Chlortrimeton 4mg  At bedtime   Plan for CT chest in August.  Follow up with Dr. Melvyn Novas  Or Ryett Hamman NP in 3  months  and As needed   Please contact office for sooner follow up if symptoms do not improve or worsen or seek emergency care          Rexene Edison, NP 12/20/2018

## 2018-12-20 NOTE — Patient Instructions (Addendum)
Continue on Gabapentin 100mg  Three times a day  .  Continue on Symbicort 2 puffs Twice daily  , rinse after use.  Continue on Prilosec daily before meal  GERD diet.  Continue on Chlortrimeton 4mg  At bedtime   Plan for CT chest in August.  Follow up with Dr. Melvyn Novas  Or Garrie Woodin NP in 3  months  and As needed   Please contact office for sooner follow up if symptoms do not improve or worsen or seek emergency care

## 2018-12-20 NOTE — Assessment & Plan Note (Signed)
Will continue on current regimen.  Doing well on gabapentin.  Plan  Patient Instructions  Continue on Gabapentin 100mg  Three times a day  .  Continue on Symbicort 2 puffs Twice daily  , rinse after use.  Continue on Prilosec daily before meal  GERD diet.  Continue on Chlortrimeton 4mg  At bedtime   Plan for CT chest in August.  Follow up with Dr. Melvyn Novas  Or Ciaran Begay NP in 3  months  and As needed   Please contact office for sooner follow up if symptoms do not improve or worsen or seek emergency care

## 2018-12-20 NOTE — Assessment & Plan Note (Signed)
Follow-up CT chest in August

## 2019-01-05 ENCOUNTER — Other Ambulatory Visit: Payer: Self-pay | Admitting: *Deleted

## 2019-01-05 DIAGNOSIS — R918 Other nonspecific abnormal finding of lung field: Secondary | ICD-10-CM

## 2019-01-17 ENCOUNTER — Encounter (INDEPENDENT_AMBULATORY_CARE_PROVIDER_SITE_OTHER): Payer: Medicare Other | Admitting: Ophthalmology

## 2019-01-17 ENCOUNTER — Other Ambulatory Visit: Payer: Self-pay

## 2019-01-17 DIAGNOSIS — H34831 Tributary (branch) retinal vein occlusion, right eye, with macular edema: Secondary | ICD-10-CM

## 2019-01-17 DIAGNOSIS — H43813 Vitreous degeneration, bilateral: Secondary | ICD-10-CM | POA: Diagnosis not present

## 2019-01-17 DIAGNOSIS — H353132 Nonexudative age-related macular degeneration, bilateral, intermediate dry stage: Secondary | ICD-10-CM | POA: Diagnosis not present

## 2019-01-17 DIAGNOSIS — H35033 Hypertensive retinopathy, bilateral: Secondary | ICD-10-CM | POA: Diagnosis not present

## 2019-01-17 DIAGNOSIS — I10 Essential (primary) hypertension: Secondary | ICD-10-CM

## 2019-01-19 ENCOUNTER — Other Ambulatory Visit: Payer: Self-pay | Admitting: Family Medicine

## 2019-01-19 DIAGNOSIS — Z1231 Encounter for screening mammogram for malignant neoplasm of breast: Secondary | ICD-10-CM

## 2019-02-24 ENCOUNTER — Other Ambulatory Visit (HOSPITAL_COMMUNITY): Payer: Self-pay | Admitting: *Deleted

## 2019-02-27 ENCOUNTER — Other Ambulatory Visit: Payer: Self-pay

## 2019-02-27 ENCOUNTER — Ambulatory Visit (HOSPITAL_COMMUNITY)
Admission: RE | Admit: 2019-02-27 | Discharge: 2019-02-27 | Disposition: A | Discharge status: Home / Self Care | Payer: Medicare Other | Source: Ambulatory Visit | Attending: Family Medicine | Admitting: Family Medicine

## 2019-02-27 DIAGNOSIS — M810 Age-related osteoporosis without current pathological fracture: Secondary | ICD-10-CM | POA: Diagnosis present

## 2019-02-27 MED ORDER — ZOLEDRONIC ACID 5 MG/100ML IV SOLN
5.0000 mg | Freq: Once | INTRAVENOUS | Status: AC
  Administered 2019-02-27: 5 mg via INTRAVENOUS

## 2019-02-27 MED ORDER — ZOLEDRONIC ACID 5 MG/100ML IV SOLN
INTRAVENOUS | Status: AC
  Administered 2019-02-27: 5 mg via INTRAVENOUS
  Filled 2019-02-27: qty 100

## 2019-03-02 ENCOUNTER — Ambulatory Visit
Admission: RE | Admit: 2019-03-02 | Discharge: 2019-03-02 | Disposition: A | Discharge status: Home / Self Care | Payer: Medicare Other | Source: Ambulatory Visit | Attending: Thoracic Surgery (Cardiothoracic Vascular Surgery) | Admitting: Thoracic Surgery (Cardiothoracic Vascular Surgery)

## 2019-03-02 DIAGNOSIS — R918 Other nonspecific abnormal finding of lung field: Secondary | ICD-10-CM

## 2019-03-06 ENCOUNTER — Ambulatory Visit
Admission: RE | Admit: 2019-03-06 | Discharge: 2019-03-06 | Disposition: A | Discharge status: Home / Self Care | Payer: Medicare Other | Source: Ambulatory Visit | Attending: Family Medicine | Admitting: Family Medicine

## 2019-03-06 ENCOUNTER — Other Ambulatory Visit: Payer: Self-pay

## 2019-03-06 DIAGNOSIS — Z1231 Encounter for screening mammogram for malignant neoplasm of breast: Secondary | ICD-10-CM

## 2019-03-07 ENCOUNTER — Ambulatory Visit: Payer: Medicare Other | Admitting: Thoracic Surgery (Cardiothoracic Vascular Surgery)

## 2019-03-07 VITALS — BP 150/90 | HR 80 | Temp 97.8°F | Resp 20 | Ht 63.0 in | Wt 191.0 lb

## 2019-03-07 DIAGNOSIS — C3491 Malignant neoplasm of unspecified part of right bronchus or lung: Secondary | ICD-10-CM

## 2019-03-07 NOTE — Progress Notes (Signed)
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  She is been followed since her resection in 2015 with CT scans.  She has multiple groundglass opacities bilaterally.  The largest one is been in the right upper lobe.  There was no solid component.  She also has multiple more solid appearing nodules which have remained stable over time.  In the interim since her last visit she has been doing well.  She is still using a Symbicort inhaler but is not having to use her rescue inhaler.  She has not had any change in appetite or significant weight loss.  No unusual headaches or visual changes.  Past Medical History:  Diagnosis Date  . Adenocarcinoma of lung, stage 1 ([place])    Status post right lower lobectomy August 2015  . Anxiety   . Asthma   . Atrophic vaginitis   . Chronic iritis of left eye    Pupil stays dilated; nonreactive  . Colon polyp   . Contact lens/glasses fitting    wears contacts or glasses  . Depression   . Depression with anxiety   . Environmental allergies   . GERD (gastroesophageal reflux disease)   . Hypertriglyceridemia   . Kidney stones   . OA (osteoarthritis)   . Osteopenia   . PONV (postoperative nausea and vomiting)     Current Outpatient Medications  Medication Sig Dispense Refill  . albuterol (PROAIR HFA) 108 (90 BASE) MCG/ACT inhaler Inhale 2 puffs into the lungs every 6 (six) hours as needed for wheezing or shortness of breath. 1 Inhaler 4  . amLODipine (NORVASC) 5 MG tablet Take 5 mg by mouth daily after breakfast.    . Ascorbic Acid (VITAMIN C) 100 MG tablet Take 100 mg by mouth  daily.    Marland Kitchen aspirin (ASPIRIN CHILDRENS) 81 MG chewable tablet Chew 1 tablet (81 mg total) by mouth 2 (two) times daily. 60 tablet 0  . budesonide-formoterol (SYMBICORT) 80-4.5 MCG/ACT inhaler Inhale 2 puffs into the lungs 2 (two) times daily. (Patient taking differently: Inhale 2 puffs into the lungs daily. ) 1 Inhaler 5  . Calcium-Magnesium-Vitamin D (CALCIUM 1200+D3 PO) Take 1 tablet by mouth daily.     . chlorpheniramine (CHLOR-TRIMETON) 4 MG tablet Take 4 mg by mouth at bedtime.     . Cholecalciferol (VITAMIN D3 PO) Take 1 capsule by mouth daily.    . dorzolamide-timolol (COSOPT) 22.3-6.8 MG/ML ophthalmic solution Place 1 drop into both eyes 2 (two) times daily.    . fenofibrate 160 MG tablet Take 160 mg by mouth daily.    Marland Kitchen FLUoxetine (PROZAC) 40 MG capsule Take 40 mg by mouth daily.     Marland Kitchen gabapentin (NEURONTIN) 100 MG capsule TAKE 1 CAPSULE (100 MG TOTAL) BY MOUTH 3 (THREE) TIMES DAILY. ONE THREE TIMES DAILY (Patient taking differently: Take 100 mg by mouth 3 (three) times daily. ) 270 capsule 1  . losartan (COZAAR) 100 MG tablet Take 1 tablet (100 mg total) by mouth daily. 30 tablet 11  . methocarbamol (ROBAXIN) 500 MG tablet Take 1 tablet (  500 mg total) by mouth 3 (three) times daily as needed. 60 tablet 1  . montelukast (SINGULAIR) 10 MG tablet Take 1 tablet (10 mg total) by mouth at bedtime. 90 tablet 0  . Multiple Vitamins-Minerals (CENTRUM SILVER PO) Take 1 tablet by mouth daily.    Marland Kitchen omeprazole (PRILOSEC) 40 MG capsule Take 40 mg by mouth daily.    . solifenacin (VESICARE) 10 MG tablet Take 10 mg by mouth daily.    . traZODone (DESYREL) 50 MG tablet Take 50 mg by mouth at bedtime.     No current facility-administered medications for this visit.     Physical Exam BP (!) 150/90   Pulse 80   Temp 97.8 F (36.6 C) (Skin)   Resp 20   Ht 5\' 3"  (1.6 m)   Wt 191 lb (86.6 kg)   SpO2 91% Comment: RA  BMI 33.36 kg/m  78 year old woman in no acute distress Alert and oriented x3 with  no focal deficits Lungs diminished at right base, otherwise clear Cardiac regular rate and rhythm normal S1 and S2  Diagnostic Tests: CT CHEST WITHOUT CONTRAST  TECHNIQUE: Multidetector CT imaging of the chest was performed following the standard protocol without IV contrast.  COMPARISON:  Chest CT 02/15/2018.  FINDINGS: Cardiovascular: Heart size is normal. There is no significant pericardial fluid, thickening or pericardial calcification. There is aortic atherosclerosis, as well as atherosclerosis of the great vessels of the mediastinum and the coronary arteries, including calcified atherosclerotic plaque in the left main, left anterior descending and right coronary arteries. Severe calcifications of the mitral annulus.  Mediastinum/Nodes: No pathologically enlarged mediastinal or hilar lymph nodes. Please note that accurate exclusion of hilar adenopathy is limited on noncontrast CT scans. Esophagus is unremarkable in appearance. No axillary lymphadenopathy.  Lungs/Pleura: Multiple small solid appearing pulmonary nodules measuring 4 mm or less in size, stable compared to the prior examination, considered benign. Several other ground-glass attenuation areas are noted in the lungs, some of which are somewhat nodular or mass-like in appearance, also similar to the prior study. The largest of these has grown slightly in the periphery of the right upper lobe (axial image 29 of series 8) measuring 3.2 x 3.0 cm (previously 2.9 cm), with no central solid component. No acute consolidative airspace disease. No pleural effusions. Postoperative changes of right lower lobectomy.  Upper Abdomen: 1.8 cm partially calcified gallstone in the gallbladder no findings to suggest an acute cholecystitis at this time. Aortic atherosclerosis.  Musculoskeletal: There are no aggressive appearing lytic or blastic lesions noted in the visualized portions of the skeleton. Chronic appearing  compression fracture of T12 with 50% loss of anterior vertebral body height. There are no aggressive appearing lytic or blastic lesions noted in the visualized portions of the skeleton.  IMPRESSION: 1. Multiple small pulmonary nodules are generally similar in size and number to the prior examination. All the solid nodule seen on the prior study measure 4 mm or less in size and are stable, considered definitively benign. The larger ground-glass attenuation areas are also very similar, with minimal growth of the largest of these lesions in the right upper lobe near the apex, as above. Continued attention on repeat noncontrast chest CT is recommended in 2 years to re-evaluate these ground-glass attenuation nodules. 2. Aortic atherosclerosis, in addition to left main and 3 vessel coronary artery disease. Assessment for potential risk factor modification, dietary therapy or pharmacologic therapy may be warranted, if clinically indicated. 3. There are calcifications of the mitral annulus.  Echocardiographic correlation for evaluation of potential valvular dysfunction may be warranted if clinically indicated. 4. Cholelithiasis.  Aortic Atherosclerosis (ICD10-I70.0).   Electronically Signed   By: Vinnie Langton M.D.   On: 03/02/2019 09:41 I personally reviewed the CT images and concur with the findings noted above.  Impression: [name] is a 78 year old woman with a remote history of tobacco abuse, COPD, and history of a right lower lobectomy for a stage Ia adenocarcinoma.  Lung nodules and groundglass opacities-she has been followed since her surgery with CT scans for multiple lung nodules and groundglass opacities bilaterally.  On her scan last week there is a slight increase in the size of a groundglass opacity in the right upper lobe.  There is no solid component to suggest invasive cancer.  All the other areas are stable.  Radiology recommended follow-up in 2 years, but I  recommended her that we repeat a CT in a year to reevaluate that area.  COPD-managed by pulmonary  Plan: Return in 1 year with CT chest  Melrose Nakayama, MD [facility] [phone]

## 2019-03-08 ENCOUNTER — Other Ambulatory Visit: Payer: Self-pay | Admitting: Internal Medicine

## 2019-03-28 ENCOUNTER — Ambulatory Visit: Payer: Medicare Other | Admitting: Adult Health

## 2019-03-28 ENCOUNTER — Encounter (INDEPENDENT_AMBULATORY_CARE_PROVIDER_SITE_OTHER): Payer: Medicare Other | Admitting: Ophthalmology

## 2019-04-03 ENCOUNTER — Encounter: Payer: Self-pay | Admitting: Adult Health

## 2019-04-03 ENCOUNTER — Other Ambulatory Visit: Payer: Self-pay

## 2019-04-03 ENCOUNTER — Ambulatory Visit: Payer: Medicare Other | Admitting: Adult Health

## 2019-04-03 DIAGNOSIS — R05 Cough: Secondary | ICD-10-CM | POA: Diagnosis not present

## 2019-04-03 DIAGNOSIS — J45991 Cough variant asthma: Secondary | ICD-10-CM

## 2019-04-03 DIAGNOSIS — C3491 Malignant neoplasm of unspecified part of right bronchus or lung: Secondary | ICD-10-CM | POA: Diagnosis not present

## 2019-04-03 DIAGNOSIS — R053 Chronic cough: Secondary | ICD-10-CM

## 2019-04-03 DIAGNOSIS — Z23 Encounter for immunization: Secondary | ICD-10-CM | POA: Diagnosis not present

## 2019-04-03 NOTE — Patient Instructions (Addendum)
Continue on Gabapentin 100mg  2-3 times a day  .  Continue on Symbicort 2 puffs Twice daily  , rinse after use.  Continue on Prilosec daily before meal  GERD diet.  Continue on Chlortrimeton 4mg  At bedtime   Flu shot today .  Plan for CT chest next year as planned  Follow up with Dr. Melvyn Novas  Or Parrett NP in 4  months  and As needed   Please contact office for sooner follow up if symptoms do not improve or worsen or seek emergency care

## 2019-04-03 NOTE — Assessment & Plan Note (Signed)
Doing well on current regimen.  No changes  Plan  Patient Instructions  Continue on Gabapentin 100mg  2-3 times a day  .  Continue on Symbicort 2 puffs Twice daily  , rinse after use.  Continue on Prilosec daily before meal  GERD diet.  Continue on Chlortrimeton 4mg  At bedtime   Flu shot today .  Plan for CT chest next year as planned  Follow up with Dr. Melvyn Novas  Or Lucius Wise NP in 4  months  and As needed   Please contact office for sooner follow up if symptoms do not improve or worsen or seek emergency care

## 2019-04-03 NOTE — Assessment & Plan Note (Signed)
Well-controlled on present regimen.  Patient has tried to discontinue gabapentin the past but does well on low-dose gabapentin at 100 mg 2-3 times a day.    Plan  Patient Instructions  Continue on Gabapentin 100mg  2-3 times a day  .  Continue on Symbicort 2 puffs Twice daily  , rinse after use.  Continue on Prilosec daily before meal  GERD diet.  Continue on Chlortrimeton 4mg  At bedtime   Flu shot today .  Plan for CT chest next year as planned  Follow up with Dr. Melvyn Novas  Or Parrett NP in 4  months  and As needed   Please contact office for sooner follow up if symptoms do not improve or worsen or seek emergency care

## 2019-04-03 NOTE — Assessment & Plan Note (Signed)
Adenocarcinoma of the right lung status post right lower lobectomy in August 2015.  Most recent CT chest showed stability.  She does have a groundglass attenuation in the right upper lobe.  There was measuring slightly enlarged.  Plans for a repeat CT chest in 1 year.

## 2019-04-03 NOTE — Progress Notes (Signed)
@Patient  ID: [name], female    DOB: 09/07/1940, 78 y.o.   MRN: [id number]  Chief Complaint  Patient presents with  . Follow-up    Asthma     Referring provider: Hulan Fess, MD  HPI: 78 year old female former smoker followed for chronic cough, lung cancer (adenocarcinoma status post right lower lobectomy August 2015)  TEST/EVENTS :  Spirometry 02/23/13 wnl  - 12/29/2014Sinus CT >Mild chronic sinus disease as described. No acute findings. Left-to-right nasal septal deviation of 3 mm. -med calendar 08/08/13 , redone 11/27/2017and 02/22/2017 - Eos 4% 10/13/2013>rec singulair daily  - FENO 05/12/2016= 24 on singulair  - Allergy profile 05/12/2016>IgE 47 pos RAST ragweed /oak/ birch -CT chest 02/09/2017 no evidence of disease recurrence, no findings of suspicious metastatic disease. Numerous chronic subcentimeter solid pulmonary nodules scattered. -CT chest 02/2018 s/p RLL lobectomy , stable nodules .  04/03/2019 Follow up : Chronic Cough and Asthma  Patient presents for a 76-month follow-up.  Patient has underlying chronic cough and asthma.  She is on Symbicort twice daily.  She is on gabapentin 100 mg 2- 3 times daily for chronic cough.  Says she is tried to taper off of this but cough always comes back while she is taking gabapentin is cough free. She is on a preventative GERD and rhinitis regimen with Prilosec daily and Chlortab 4 mg at bedtime.  She says she is doing well overall. Has seen some cough for last 2 weeks with rain and ragweed. Has decreased Gabapentin to Twice daily  . Is planning to go back up to Three times a day  .  No albuterol use..  Patient has a history of adenocarcinoma with right lower lobectomy in August 2015 CT chest August 2020 showed stable groundglass attenuation with the largest in the right upper lobe measuring 3.2 x 3.0 cm with no central solid component (slight increase in size from 2.9 cm), stable multiple small pulmonary nodules 4  mm or less.  Seen by thoracic surgery March 07, 2019 with plans for follow-up CT chest in 1 year she denies any hemoptysis or weight loss.  Has new puppy , [name] .     Allergies  Allergen Reactions  . Grass Extracts [Gramineae Pollens] Other (See Comments)    Unknown  . Nsaids Other (See Comments)    Elevated kidney fx  . Prednisone Nausea And Vomiting and Other (See Comments)    Can take Prednisone injection but not orally    Immunization History  Administered Date(s) Administered  . Influenza Split 04/05/2013  . Influenza, High Dose Seasonal PF 04/05/2016, 04/05/2018  . Influenza-Unspecified 05/05/2017    Past Medical History:  Diagnosis Date  . Adenocarcinoma of lung, stage 1 ([place])    Status post right lower lobectomy August 2015  . Anxiety   . Asthma   . Atrophic vaginitis   . Chronic iritis of left eye    Pupil stays dilated; nonreactive  . Colon polyp   . Contact lens/glasses fitting    wears contacts or glasses  . Depression   . Depression with anxiety   . Environmental allergies   . GERD (gastroesophageal reflux disease)   . Hypertriglyceridemia   . Kidney stones   . OA (osteoarthritis)   . Osteopenia   . PONV (postoperative nausea and vomiting)     Tobacco History: Social History   Tobacco Use  Smoking Status Former Smoker  . Packs/day: 0.75  . Years: 20.00  . Pack years: 15.00  . Types:  Cigarettes  . Quit date: 07/06/1978  . Years since quitting: 40.7  Smokeless Tobacco Never Used   Counseling given: Not Answered   Outpatient Medications Prior to Visit  Medication Sig Dispense Refill  . albuterol (PROAIR HFA) 108 (90 BASE) MCG/ACT inhaler Inhale 2 puffs into the lungs every 6 (six) hours as needed for wheezing or shortness of breath. 1 Inhaler 4  . amLODipine (NORVASC) 5 MG tablet Take 5 mg by mouth daily after breakfast.    . Ascorbic Acid (VITAMIN C) 100 MG tablet Take 100 mg by mouth daily.    Marland Kitchen aspirin (ASPIRIN CHILDRENS) 81 MG chewable  tablet Chew 1 tablet (81 mg total) by mouth 2 (two) times daily. 60 tablet 0  . budesonide-formoterol (SYMBICORT) 80-4.5 MCG/ACT inhaler Inhale 2 puffs into the lungs daily. 3 Inhaler 3  . Calcium-Magnesium-Vitamin D (CALCIUM 1200+D3 PO) Take 1 tablet by mouth daily.     . chlorpheniramine (CHLOR-TRIMETON) 4 MG tablet Take 4 mg by mouth at bedtime.     . Cholecalciferol (VITAMIN D3 PO) Take 1 capsule by mouth daily.    . dorzolamide-timolol (COSOPT) 22.3-6.8 MG/ML ophthalmic solution Place 1 drop into both eyes 2 (two) times daily.    . famotidine (PEPCID) 20 MG tablet Take 20 mg by mouth at bedtime.    . fenofibrate 160 MG tablet Take 160 mg by mouth daily.    Marland Kitchen FLUoxetine (PROZAC) 40 MG capsule Take 40 mg by mouth daily.     Marland Kitchen gabapentin (NEURONTIN) 100 MG capsule TAKE 1 CAPSULE (100 MG TOTAL) BY MOUTH 3 (THREE) TIMES DAILY. ONE THREE TIMES DAILY (Patient taking differently: Take 100 mg by mouth 3 (three) times daily. ) 270 capsule 1  . losartan (COZAAR) 100 MG tablet Take 1 tablet (100 mg total) by mouth daily. 30 tablet 11  . methocarbamol (ROBAXIN) 500 MG tablet Take 1 tablet (500 mg total) by mouth 3 (three) times daily as needed. 60 tablet 1  . montelukast (SINGULAIR) 10 MG tablet Take 1 tablet (10 mg total) by mouth at bedtime. 90 tablet 0  . Multiple Vitamins-Minerals (CENTRUM SILVER PO) Take 1 tablet by mouth daily.    Marland Kitchen omeprazole (PRILOSEC) 40 MG capsule Take 40 mg by mouth daily.    . solifenacin (VESICARE) 10 MG tablet Take 10 mg by mouth daily.    . traZODone (DESYREL) 50 MG tablet Take 50 mg by mouth at bedtime.     No facility-administered medications prior to visit.      Review of Systems:   Constitutional:   No  weight loss, night sweats,  Fevers, chills, fatigue, or  lassitude.  HEENT:   No headaches,  Difficulty swallowing,  Tooth/dental problems, or  Sore throat,                No sneezing, itching, ear ache, nasal congestion, post nasal drip,   CV:  No chest pain,   Orthopnea, PND, swelling in lower extremities, anasarca, dizziness, palpitations, syncope.   GI  No heartburn, indigestion, abdominal pain, nausea, vomiting, diarrhea, change in bowel habits, loss of appetite, bloody stools.   Resp: No shortness of breath with exertion or at rest.  No excess mucus, no productive cough,  No non-productive cough,  No coughing up of blood.  No change in color of mucus.  No wheezing.  No chest wall deformity  Skin: no rash or lesions.  GU: no dysuria, change in color of urine, no urgency or frequency.  No flank pain,  no hematuria   MS:  No joint pain or swelling.  No decreased range of motion.  No back pain.    Physical Exam  BP 114/62 (BP Location: Left Arm, Cuff Size: Normal)   Pulse 65   Temp (!) 96.4 F (35.8 C) (Temporal)   Ht 5\' 3"  (1.6 m)   Wt 191 lb 3.2 oz (86.7 kg)   SpO2 96%   BMI 33.87 kg/m   GEN: A/Ox3; pleasant , NAD, well nourished    HEENT:  [place]/AT,  NOSE-clear, THROAT-clear, no lesions, no postnasal drip or exudate noted.   NECK:  Supple w/ fair ROM; no JVD; normal carotid impulses w/o bruits; no thyromegaly or nodules palpated; no lymphadenopathy.    RESP  Clear  P & A; w/o, wheezes/ rales/ or rhonchi. no accessory muscle use, no dullness to percussion  CARD:  RRR, no m/r/g, no peripheral edema, pulses intact, no cyanosis or clubbing.  GI:   Soft & nt; nml bowel sounds; no organomegaly or masses detected.   Musco: Warm bil, no deformities or joint swelling noted.   Neuro: alert, no focal deficits noted.    Skin: Warm, no lesions or rashes    Lab Results:  CBC  BNP No results found for: BNP  ProBNP No results found for: PROBNP  Imaging: Mm 3d Screen Breast Bilateral  Result Date: 03/07/2019 CLINICAL DATA:  Screening. EXAM: DIGITAL SCREENING BILATERAL MAMMOGRAM WITH TOMO AND CAD COMPARISON:  Previous exam(s). ACR Breast Density Category b: There are scattered areas of fibroglandular density. FINDINGS: There are no  findings suspicious for malignancy. Images were processed with CAD. IMPRESSION: No mammographic evidence of malignancy. A result letter of this screening mammogram will be mailed directly to the patient. RECOMMENDATION: Screening mammogram in one year. (Code:SM-B-01Y) BI-RADS CATEGORY  1: Negative. Electronically Signed   By: Abelardo Diesel M.D.   On: 03/07/2019 10:13      PFT Results Latest Ref Rng & Units 05/18/2017 02/08/2014  FVC-Pre L 1.83 2.17  FVC-Predicted Pre % 69 79  FVC-Post L 1.88 1.99  FVC-Predicted Post % 71 72  Pre FEV1/FVC % % 62 66  Post FEV1/FCV % % 69 72  FEV1-Pre L 1.13 1.43  FEV1-Predicted Pre % 58 69  FEV1-Post L 1.29 1.42  DLCO UNC% % 70 56  DLCO COR %Predicted % 103 89  TLC L 4.98 4.48  TLC % Predicted % 101 91  RV % Predicted % 122 101    Lab Results  Component Value Date   NITRICOXIDE 24 05/18/2017        Assessment & Plan:   No problem-specific Assessment & Plan notes found for this encounter.     Rexene Edison, NP 04/03/2019

## 2019-04-04 ENCOUNTER — Encounter (INDEPENDENT_AMBULATORY_CARE_PROVIDER_SITE_OTHER): Payer: Medicare Other | Admitting: Ophthalmology

## 2019-04-04 ENCOUNTER — Other Ambulatory Visit: Payer: Self-pay

## 2019-04-04 DIAGNOSIS — H35033 Hypertensive retinopathy, bilateral: Secondary | ICD-10-CM | POA: Diagnosis not present

## 2019-04-04 DIAGNOSIS — H34831 Tributary (branch) retinal vein occlusion, right eye, with macular edema: Secondary | ICD-10-CM

## 2019-04-04 DIAGNOSIS — I10 Essential (primary) hypertension: Secondary | ICD-10-CM | POA: Diagnosis not present

## 2019-04-04 DIAGNOSIS — H43813 Vitreous degeneration, bilateral: Secondary | ICD-10-CM

## 2019-04-04 DIAGNOSIS — H353132 Nonexudative age-related macular degeneration, bilateral, intermediate dry stage: Secondary | ICD-10-CM | POA: Diagnosis not present

## 2019-05-23 ENCOUNTER — Telehealth: Payer: Self-pay | Admitting: Adult Health

## 2019-05-23 MED ORDER — GABAPENTIN 100 MG PO CAPS: 100.0000 mg | ORAL_CAPSULE | Freq: Three times a day (TID) | ORAL | 1 refills | Status: DC

## 2019-05-23 NOTE — Telephone Encounter (Signed)
Pt requesting a refill of Gabapentin- 90 day.  This has been sent to pharmacy as requested.  Nothing further needed at this time- will close encounter.

## 2019-05-30 ENCOUNTER — Encounter (INDEPENDENT_AMBULATORY_CARE_PROVIDER_SITE_OTHER): Payer: Medicare Other | Admitting: Ophthalmology

## 2019-05-30 ENCOUNTER — Other Ambulatory Visit: Payer: Self-pay

## 2019-05-30 DIAGNOSIS — H34831 Tributary (branch) retinal vein occlusion, right eye, with macular edema: Secondary | ICD-10-CM

## 2019-05-30 DIAGNOSIS — I10 Essential (primary) hypertension: Secondary | ICD-10-CM

## 2019-05-30 DIAGNOSIS — H353132 Nonexudative age-related macular degeneration, bilateral, intermediate dry stage: Secondary | ICD-10-CM | POA: Diagnosis not present

## 2019-05-30 DIAGNOSIS — H35033 Hypertensive retinopathy, bilateral: Secondary | ICD-10-CM | POA: Diagnosis not present

## 2019-05-30 DIAGNOSIS — H43813 Vitreous degeneration, bilateral: Secondary | ICD-10-CM

## 2019-07-31 ENCOUNTER — Other Ambulatory Visit: Payer: Self-pay

## 2019-07-31 ENCOUNTER — Encounter (INDEPENDENT_AMBULATORY_CARE_PROVIDER_SITE_OTHER): Payer: Medicare Other | Admitting: Ophthalmology

## 2019-07-31 DIAGNOSIS — H34831 Tributary (branch) retinal vein occlusion, right eye, with macular edema: Secondary | ICD-10-CM | POA: Diagnosis not present

## 2019-07-31 DIAGNOSIS — H353132 Nonexudative age-related macular degeneration, bilateral, intermediate dry stage: Secondary | ICD-10-CM | POA: Diagnosis not present

## 2019-07-31 DIAGNOSIS — I10 Essential (primary) hypertension: Secondary | ICD-10-CM | POA: Diagnosis not present

## 2019-07-31 DIAGNOSIS — H35033 Hypertensive retinopathy, bilateral: Secondary | ICD-10-CM | POA: Diagnosis not present

## 2019-07-31 DIAGNOSIS — H43813 Vitreous degeneration, bilateral: Secondary | ICD-10-CM

## 2019-08-03 ENCOUNTER — Ambulatory Visit: Payer: Medicare Other | Admitting: Adult Health

## 2019-08-03 ENCOUNTER — Other Ambulatory Visit: Payer: Self-pay

## 2019-08-03 ENCOUNTER — Encounter: Payer: Self-pay | Admitting: Adult Health

## 2019-08-03 ENCOUNTER — Other Ambulatory Visit: Payer: Self-pay | Admitting: Adult Health

## 2019-08-03 VITALS — BP 122/68 | HR 71 | Temp 97.1°F | Ht 64.0 in | Wt 189.2 lb

## 2019-08-03 DIAGNOSIS — C3491 Malignant neoplasm of unspecified part of right bronchus or lung: Secondary | ICD-10-CM

## 2019-08-03 DIAGNOSIS — R300 Dysuria: Secondary | ICD-10-CM

## 2019-08-03 DIAGNOSIS — J4530 Mild persistent asthma, uncomplicated: Secondary | ICD-10-CM

## 2019-08-03 DIAGNOSIS — R053 Chronic cough: Secondary | ICD-10-CM

## 2019-08-03 DIAGNOSIS — R05 Cough: Secondary | ICD-10-CM | POA: Diagnosis not present

## 2019-08-03 HISTORY — DX: Dysuria: R30.0

## 2019-08-03 LAB — URINALYSIS, ROUTINE W REFLEX MICROSCOPIC
Nitrite: NEGATIVE
Specific Gravity, Urine: >=1.03 — AB (ref 1.000–1.030)
Total Protein, Urine: 100 — AB
Urine Glucose: NEGATIVE
Urobilinogen, UA: 0.2 (ref 0.0–1.0)
pH: 5.5 (ref 5.0–8.0)

## 2019-08-03 MED ORDER — CIPROFLOXACIN HCL 250 MG PO TABS: 250.0000 mg | ORAL_TABLET | Freq: Two times a day (BID) | ORAL | 0 refills | Status: AC

## 2019-08-03 MED ORDER — ALBUTEROL SULFATE HFA 108 (90 BASE) MCG/ACT IN AERS: 2.0000 | INHALATION_SPRAY | Freq: Four times a day (QID) | RESPIRATORY_TRACT | 5 refills | Status: DC | PRN

## 2019-08-03 NOTE — Assessment & Plan Note (Signed)
Dysuria and urinary urgency.  Check urine micro and a urine culture.  Advised her to follow with primary care physician if symptoms persist.  If urinary tract infections is present will treat with antibiotics accordingly.  Patient advised to push fluids.  If symptoms or not improving or worsen she will need to seek care with her primary care doctor or emergency care

## 2019-08-03 NOTE — Progress Notes (Signed)
@Patient  ID: [name], female    DOB: [birth year], 79 y.o.   MRN: [id number]  Chief Complaint  Patient presents with  . Follow-up    Asthma     Referring provider: Hulan Fess, MD  HPI: 79 year old female former smoker followed for chronic cough, lung cancer (adenocarcinoma status post right lower lobectomy August 2015)  TEST/EVENTS :  Spirometry 02/23/13 wnl  - 12/29/2014Sinus CT >Mild chronic sinus disease as described. No acute findings. Left-to-right nasal septal deviation of 3 mm. -med calendar 08/08/13 , redone 11/27/2017and 02/22/2017 - Eos 4% 10/13/2013>rec singulair daily  - FENO 05/12/2016= 24 on singulair  - Allergy profile 05/12/2016>IgE 47 pos RAST ragweed /oak/ birch -CT chest 02/09/2017 no evidence of disease recurrence, no findings of suspicious metastatic disease. Numerous chronic subcentimeter solid pulmonary nodules scattered. -CT chest 02/2018 s/p RLL lobectomy , stable nodules .  08/03/2019 Follow up : Chronic Cough , Asthma  Patient returns for a 40-month follow-up.  Patient has underlying chronic asthma and cough.  She remains on Symbicort twice daily.  She does take gabapentin 2-3 times a day for chronic cough that seems to have helped quite a bit.  She is on a preventative GERD and rhinitis regimen with Prilosec daily and Chlor-Trimeton 4 mg at bedtime.  She says overall she is doing well.  She has noticed since the weather has turned cold, that cold air will cause her to cough.  She tries to wear a scarf or mask to prevent this.  She denies any chest pain orthopnea PND or increased leg swelling.  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  CT chest August 2020 showed stable groundglass attenuation with the largest in the right upper lobe measuring 3.2 x 3.0 with no central solid component.  This was a slight increase from 2.9 cm.  She has stable pulmonary nodules.  She has plans for a follow-up CT in 1 year.   Patient does complain of urinary frequency urgency and dysuria over the last several days.  Would like to be checked for urinary tract infection.  She denies any nausea vomiting diarrhea or severe back pain.  No fever.  Allergies  Allergen Reactions  . Grass Extracts [Gramineae Pollens] Other (See Comments)    Unknown  . Nsaids Other (See Comments)    Elevated kidney fx  . Prednisone Nausea And Vomiting and Other (See Comments)    Can take Prednisone injection but not orally    Immunization History  Administered Date(s) Administered  . Fluad Quad(high Dose 65+) 04/03/2019  . Influenza Split 04/05/2013  . Influenza, High Dose Seasonal PF 04/05/2016, 04/05/2018  . Influenza-Unspecified 05/05/2017    Past Medical History:  Diagnosis Date  . Adenocarcinoma of lung, stage 1 ([place])    Status post right lower lobectomy August 2015  . Anxiety   . Asthma   . Atrophic vaginitis   . Chronic iritis of left eye    Pupil stays dilated; nonreactive  . Colon polyp   . Contact lens/glasses fitting    wears contacts or glasses  . Depression   . Depression with anxiety   . Environmental allergies   . GERD (gastroesophageal reflux disease)   . Hypertriglyceridemia   . Kidney stones   . OA (osteoarthritis)   . Osteopenia   . PONV (postoperative nausea and vomiting)     Tobacco History: Social History   Tobacco Use  Smoking Status Former Smoker  . Packs/day: 0.75  .  Years: 20.00  . Pack years: 15.00  . Types: Cigarettes  . Quit date: 07/06/1978  . Years since quitting: 41.1  Smokeless Tobacco Never Used   Counseling given: Not Answered   Outpatient Medications Prior to Visit  Medication Sig Dispense Refill  . amLODipine (NORVASC) 5 MG tablet Take 5 mg by mouth daily after breakfast.    . Ascorbic Acid (VITAMIN C) 100 MG tablet Take 100 mg by mouth daily.    Marland Kitchen aspirin (ASPIRIN CHILDRENS) 81 MG chewable tablet Chew 1 tablet (81 mg total) by mouth 2 (two) times daily. 60 tablet  0  . budesonide-formoterol (SYMBICORT) 80-4.5 MCG/ACT inhaler Inhale 2 puffs into the lungs daily. 3 Inhaler 3  . Calcium-Magnesium-Vitamin D (CALCIUM 1200+D3 PO) Take 1 tablet by mouth daily.     . chlorpheniramine (CHLOR-TRIMETON) 4 MG tablet Take 4 mg by mouth at bedtime.     . Cholecalciferol (VITAMIN D3 PO) Take 1 capsule by mouth daily.    . dorzolamide-timolol (COSOPT) 22.3-6.8 MG/ML ophthalmic solution Place 1 drop into both eyes 2 (two) times daily.    . famotidine (PEPCID) 20 MG tablet Take 20 mg by mouth at bedtime.    . fenofibrate 160 MG tablet Take 160 mg by mouth daily.    Marland Kitchen FLUoxetine (PROZAC) 40 MG capsule Take 40 mg by mouth daily.     Marland Kitchen gabapentin (NEURONTIN) 100 MG capsule Take 1 capsule (100 mg total) by mouth 3 (three) times daily. One three times daily 270 capsule 1  . losartan (COZAAR) 100 MG tablet Take 1 tablet (100 mg total) by mouth daily. 30 tablet 11  . methocarbamol (ROBAXIN) 500 MG tablet Take 1 tablet (500 mg total) by mouth 3 (three) times daily as needed. 60 tablet 1  . montelukast (SINGULAIR) 10 MG tablet Take 1 tablet (10 mg total) by mouth at bedtime. 90 tablet 0  . Multiple Vitamins-Minerals (CENTRUM SILVER PO) Take 1 tablet by mouth daily.    Marland Kitchen omeprazole (PRILOSEC) 40 MG capsule Take 40 mg by mouth daily.    . solifenacin (VESICARE) 10 MG tablet Take 10 mg by mouth daily.    . traZODone (DESYREL) 50 MG tablet Take 50 mg by mouth at bedtime.    Marland Kitchen albuterol (PROAIR HFA) 108 (90 BASE) MCG/ACT inhaler Inhale 2 puffs into the lungs every 6 (six) hours as needed for wheezing or shortness of breath. 1 Inhaler 4   No facility-administered medications prior to visit.     Review of Systems:   Constitutional:   No  weight loss, night sweats,  Fevers, chills, fatigue, or  lassitude.  HEENT:   No headaches,  Difficulty swallowing,  Tooth/dental problems, or  Sore throat,                No sneezing, itching, ear ache,  nasal congestion, post nasal drip,   CV:   No chest pain,  Orthopnea, PND, swelling in lower extremities, anasarca, dizziness, palpitations, syncope.   GI  No heartburn, indigestion, abdominal pain, nausea, vomiting, diarrhea, change in bowel habits, loss of appetite, bloody stools.   Resp:  No chest wall deformity  Skin: no rash or lesions.  GU: no dysuria, change in color of urine, no urgency or frequency.  No flank pain, no hematuria   MS:  No joint pain or swelling.  No decreased range of motion.  No back pain.    Physical Exam  BP 122/68 (BP Location: Left Arm, Cuff Size: Normal)   Pulse  71   Temp (!) 97.1 F (36.2 C) (Temporal)   Ht 5\' 4"  (1.626 m)   Wt 189 lb 3.2 oz (85.8 kg)   SpO2 97% Comment: RA  BMI 32.48 kg/m   GEN: A/Ox3; pleasant , NAD, well nourished    HEENT:  [place]/AT,    NOSE-clear, THROAT-clear, no lesions, no postnasal drip or exudate noted.   NECK:  Supple w/ fair ROM; no JVD; normal carotid impulses w/o bruits; no thyromegaly or nodules palpated; no lymphadenopathy.    RESP  Clear  P & A; w/o, wheezes/ rales/ or rhonchi. no accessory muscle use, no dullness to percussion  CARD:  RRR, no m/r/g, no peripheral edema, pulses intact, no cyanosis or clubbing.  GI:   Soft & nt; nml bowel sounds; no organomegaly or masses detected.   Musco: Warm bil, no deformities or joint swelling noted.   Neuro: alert, no focal deficits noted.    Skin: Warm, no lesions or rashes    Lab Results:  BNP No results found for: BNP  ProBNP No results found for: PROBNP  Imaging: No results found.    PFT Results Latest Ref Rng & Units 05/18/2017 02/08/2014  FVC-Pre L 1.83 2.17  FVC-Predicted Pre % 69 79  FVC-Post L 1.88 1.99  FVC-Predicted Post % 71 72  Pre FEV1/FVC % % 62 66  Post FEV1/FCV % % 69 72  FEV1-Pre L 1.13 1.43  FEV1-Predicted Pre % 58 69  FEV1-Post L 1.29 1.42  DLCO UNC% % 70 56  DLCO COR %Predicted % 103 89  TLC L 4.98 4.48  TLC % Predicted % 101 91  RV % Predicted % 122 101    Lab  Results  Component Value Date   NITRICOXIDE 24 05/18/2017        Assessment & Plan:   Asthma Currently controlled on present regimen  Plan  Patient Instructions  Leave urine sample today .  Push fluids .  Continue on Gabapentin 100mg  Twice daily  .  Continue on Symbicort 2 puffs Twice daily  , rinse after use.  Continue on Prilosec daily before meal  GERD diet.  Continue on Chlortrimeton 4mg  At bedtime   Plan for CT chest next year as planned  Follow up with Dr. Melvyn Novas  Or Elliott Quade NP in 4  months  and As needed   Please contact office for sooner follow up if symptoms do not improve or worsen or seek emergency care       Adenocarcinoma of lung, stage 1 Stable on most recent CT chest.  Plans for repeat CT chest in August 2021.  Chronic cough At baseline with present regimen of GERD and rhinitis prevention regimen along with gabapentin.  No changes     Rexene Edison, NP 08/03/2019

## 2019-08-03 NOTE — Assessment & Plan Note (Signed)
Stable on most recent CT chest.  Plans for repeat CT chest in August 2021.

## 2019-08-03 NOTE — Patient Instructions (Signed)
Leave urine sample today .  Push fluids .  Continue on Gabapentin 100mg  Twice daily  .  Continue on Symbicort 2 puffs Twice daily  , rinse after use.  Continue on Prilosec daily before meal  GERD diet.  Continue on Chlortrimeton 4mg  At bedtime   Plan for CT chest next year as planned  Follow up with Dr. Melvyn Novas  Or Brinnley Lacap NP in 4  months  and As needed   Please contact office for sooner follow up if symptoms do not improve or worsen or seek emergency care

## 2019-08-03 NOTE — Assessment & Plan Note (Signed)
At baseline with present regimen of GERD and rhinitis prevention regimen along with gabapentin.  No changes

## 2019-08-03 NOTE — Assessment & Plan Note (Signed)
Currently controlled on present regimen  Plan  Patient Instructions  Leave urine sample today .  Push fluids .  Continue on Gabapentin 100mg  Twice daily  .  Continue on Symbicort 2 puffs Twice daily  , rinse after use.  Continue on Prilosec daily before meal  GERD diet.  Continue on Chlortrimeton 4mg  At bedtime   Plan for CT chest next year as planned  Follow up with Dr. Melvyn Novas  Or Gera Inboden NP in 4  months  and As needed   Please contact office for sooner follow up if symptoms do not improve or worsen or seek emergency care

## 2019-08-03 NOTE — Progress Notes (Signed)
Called spoke with patient, advised of lab results / recs as stated by Parrett NP.  Pt verbalized understanding and denied any questions.  Orders only encounter created for Cipro Rx.

## 2019-08-05 LAB — URINE CULTURE
MICRO NUMBER:: 10091767
SPECIMEN QUALITY:: ADEQUATE

## 2019-08-11 ENCOUNTER — Telehealth: Payer: Self-pay | Admitting: Adult Health

## 2019-08-11 NOTE — Telephone Encounter (Signed)
Looking at pt's medication list and I do not see where an abx was sent to pharmacy for pt.  Tammy, please advise on this for pt. Thanks!

## 2019-08-11 NOTE — Telephone Encounter (Signed)
Please see MAR prescription was sent on August 03, [facility] to [facility] [address] Cipro 250 mg twice daily for 3 days.  Is she calling back for more antibiotics, unclear what the question is  Called patient and left message on her voicemail to return call

## 2019-08-14 NOTE — Telephone Encounter (Signed)
Spoke with pt. She is aware of Tammy's response. Nothing further was needed.

## 2019-09-25 ENCOUNTER — Encounter (INDEPENDENT_AMBULATORY_CARE_PROVIDER_SITE_OTHER): Payer: Medicare Other | Admitting: Ophthalmology

## 2019-09-25 DIAGNOSIS — H43813 Vitreous degeneration, bilateral: Secondary | ICD-10-CM

## 2019-09-25 DIAGNOSIS — I10 Essential (primary) hypertension: Secondary | ICD-10-CM | POA: Diagnosis not present

## 2019-09-25 DIAGNOSIS — H35033 Hypertensive retinopathy, bilateral: Secondary | ICD-10-CM

## 2019-09-25 DIAGNOSIS — H34831 Tributary (branch) retinal vein occlusion, right eye, with macular edema: Secondary | ICD-10-CM

## 2019-09-25 DIAGNOSIS — H353132 Nonexudative age-related macular degeneration, bilateral, intermediate dry stage: Secondary | ICD-10-CM | POA: Diagnosis not present

## 2019-11-14 ENCOUNTER — Other Ambulatory Visit: Payer: Self-pay

## 2019-11-14 ENCOUNTER — Ambulatory Visit: Payer: Medicare Other | Admitting: Interventional Cardiology

## 2019-11-14 ENCOUNTER — Encounter: Payer: Self-pay | Admitting: Interventional Cardiology

## 2019-11-14 VITALS — BP 122/74 | HR 70 | Ht 64.0 in | Wt 194.0 lb

## 2019-11-14 DIAGNOSIS — I2510 Atherosclerotic heart disease of native coronary artery without angina pectoris: Secondary | ICD-10-CM | POA: Diagnosis not present

## 2019-11-14 DIAGNOSIS — R0789 Other chest pain: Secondary | ICD-10-CM | POA: Diagnosis not present

## 2019-11-14 DIAGNOSIS — E782 Mixed hyperlipidemia: Secondary | ICD-10-CM

## 2019-11-14 DIAGNOSIS — R072 Precordial pain: Secondary | ICD-10-CM | POA: Diagnosis not present

## 2019-11-14 DIAGNOSIS — I10 Essential (primary) hypertension: Secondary | ICD-10-CM

## 2019-11-14 MED ORDER — METOPROLOL TARTRATE 50 MG PO TABS: ORAL_TABLET | ORAL | 0 refills | Status: DC

## 2019-11-14 NOTE — Progress Notes (Signed)
Cardiology Office Note   Date:  11/14/2019   ID:  [name], DOB 04-Sep-1940, MRN [id number]  PCP:  Hulan Fess, MD    No chief complaint on file.  Coronary artery calcification  Wt Readings from Last 3 Encounters:  11/14/19 194 lb (88 kg)  08/03/19 189 lb 3.2 oz (85.8 kg)  04/03/19 191 lb 3.2 oz (86.7 kg)       History of Present Illness: [name] is a 79 y.o. female who is being seen today for the evaluation of coronary artery calcifications at the request of Hulan Fess, MD.  She is the widow of [name] who is a patient of mine.  He died in 03/29/2015 from a ruptured spleen while off of anticoagulation, after suffering from atrial fibrillation and dementia.  Chest CT was done for h/o lung CA in 02/2019 and showed: "Cardiovascular: Heart size is normal. There is no significant pericardial fluid, thickening or pericardial calcification. There is aortic atherosclerosis, as well as atherosclerosis of the great vessels of the mediastinum and the coronary arteries, including calcified atherosclerotic plaque in the left main, left anterior descending and right coronary arteries. Severe calcifications of the mitral annulus."  Review of prior scans shows coronary artery calcification noted back in 2015 as well.   She follows with Dr. Roxan Hockey for lung CA and with Dr. Rex Kras.   Denies : Chest pain. Dizziness. Leg edema. Nitroglycerin use. Orthopnea. Palpitations. Paroxysmal nocturnal dyspnea.  Syncope.   Chronic shortness of breath.    She is not very active. She does some short walks with her dog. Allergies limit her and she feels a tightness in her chest after a walk outside.   Daughter had a carotid stent.   She has had  COVID vaccines and her shingles vaccine.    Past Medical History:  Diagnosis Date  .  Multiple pulmonary nodules, largest R mid lung 06/29/2013   Followed in Pulmonary clinic/ [facility]/ [facility] - see CXR  06/27/13  - CT chest 07/14/2013 > 1. No acute findings in the thorax to account for the patient's  symptoms.  2. However, there is a subsolid nodule in the   right lower lobe that has a ground-glass attenuation component  measuring 2.5 x 1.8 cm, and a small central solid component  measuring 4 mm. Initial follow-up by chest CT without   . Adenocarcinoma of lung, stage 1 ([place])    Status post right lower lobectomy August 2015  . Anxiety   . Asthma   . Atrophic vaginitis   . Chronic cough 12/20/2018  . Chronic iritis of left eye    Pupil stays dilated; nonreactive  . Colon polyp   . Contact lens/glasses fitting    wears contacts or glasses  . Cough variant asthma with component of uacs  05/31/2013   Followed in Pulmonary clinic/ [facility]/ [place] in her 83's - Spirometry 02/23/13 wnl  - 07/03/2013 Sinus CT > Mild chronic sinus disease - No acute findings. Left-to-right nasal septal deviation of 3 mm. -med calendar 08/08/13 , redone 06/01/2016 and 02/22/2017  - Eos 4%   10/13/2013  > rec singulair daily  - FENO 05/12/2016  =   24 on singulair  - Allergy profile 05/12/2016 >   IgE  47 pos  . Depression   . Depression with anxiety   . Dysuria 08/03/2019  . Environmental allergies   . Essential hypertension 02/23/2017   Changed losartan to avapro 02/22/2017 due  to cough   . GERD (gastroesophageal reflux disease)   . Hypertriglyceridemia   . Kidney stones   . OA (osteoarthritis)   . Osteopenia   . PONV (postoperative nausea and vomiting)   . S/P lobectomy of lung 02/12/2014  . 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 APPENDECTOMY    . COLONOSCOPY    . EYE SURGERY Left    cataract removal  . ORIF WRIST FRACTURE Left 08/04/2013   Procedure: OPEN REDUCTION INTERNAL FIXATION (ORIF) LEFT DISTAL RADIUS WRIST FRACTURE;  Surgeon: Wynonia Sours, MD;  Location: [facility];   Service: Orthopedics;  Laterality: Left;  . PARTIAL HYSTERECTOMY    . TOTAL KNEE ARTHROPLASTY Left 08/26/2018   Procedure: TOTAL KNEE ARTHROPLASTY;  Surgeon: Netta Cedars, MD;  Location: [facility];  Service: Orthopedics;  Laterality: Left;  Marland Kitchen VIDEO ASSISTED THORACOSCOPY (VATS)/WEDGE RESECTION Right 02/12/2014   Procedure: RIGHT VIDEO ASSISTED THORACOSCOPY RIGHT LOWER LOBE LUNG /WEDGE RESECTION,RIGHT THORACOTOMY WITH RIGHT LOWER LOBE LOBECTOMY & NODE DISSECTION;  Surgeon: Melrose Nakayama, MD;  Location: [facility];  Service: Thoracic;  Laterality: Right;  Marland Kitchen VIDEO BRONCHOSCOPY N/A 02/12/2014   Procedure: VIDEO BRONCHOSCOPY;  Surgeon: Melrose Nakayama, MD;  Location: [facility] OR;  Service: Thoracic;  Laterality: N/A;     Current Outpatient Medications  Medication Sig Dispense Refill  . albuterol (PROAIR HFA) 108 (90 Base) MCG/ACT inhaler Inhale 2 puffs into the lungs every 6 (six) hours as needed for wheezing or shortness of breath. 8 g 5  . amLODipine (NORVASC) 5 MG tablet Take 5 mg by mouth daily after breakfast.    . Ascorbic Acid (VITAMIN C) 100 MG tablet Take 100 mg by mouth daily.    . budesonide-formoterol (SYMBICORT) 80-4.5 MCG/ACT inhaler Inhale 2 puffs into the lungs daily. 3 Inhaler 3  . Calcium-Magnesium-Vitamin D (CALCIUM 1200+D3 PO) Take 1 tablet by mouth daily.     . chlorpheniramine (CHLOR-TRIMETON) 4 MG tablet Take 4 mg by mouth at bedtime.     . Cholecalciferol (VITAMIN D3 PO) Take 1 capsule by mouth daily.    . dorzolamide-timolol (COSOPT) 22.3-6.8 MG/ML ophthalmic solution Place 1 drop into both eyes 2 (two) times daily.    . famotidine (PEPCID) 20 MG tablet Take 20 mg by mouth at bedtime.    . fenofibrate 160 MG tablet Take 160 mg by mouth daily.    Marland Kitchen FLUoxetine (PROZAC) 40 MG capsule Take 40 mg by mouth daily.     Marland Kitchen gabapentin (NEURONTIN) 100 MG capsule Take 1 capsule (100 mg total) by mouth 3 (three) times daily. One three times daily 270 capsule 1  . losartan (COZAAR) 100 MG  tablet Take 1 tablet (100 mg total) by mouth daily. 30 tablet 11  . methocarbamol (ROBAXIN) 500 MG tablet Take 1 tablet (500 mg total) by mouth 3 (three) times daily as needed. 60 tablet 1  . montelukast (SINGULAIR) 10 MG tablet Take 1 tablet (10 mg total) by mouth at bedtime. 90 tablet 0  . Multiple Vitamins-Minerals (CENTRUM SILVER PO) Take 1 tablet by mouth daily.    Marland Kitchen omeprazole (PRILOSEC) 40 MG capsule Take 40 mg by mouth daily.    . solifenacin (VESICARE) 10 MG tablet Take 10 mg by mouth daily.    . traZODone (DESYREL) 50 MG tablet Take 50 mg by mouth at bedtime.    . metoprolol tartrate (LOPRESSOR) 50  MG tablet Take 1 tablet by mouth 2 hours prior to Cardiac CT 1 tablet 0   No current facility-administered medications for this visit.    Allergies:   Grass extracts [gramineae pollens], Nsaids, and Prednisone    Social History:  The patient  reports that she quit smoking about 41 years ago. Her smoking use included cigarettes. She has a 15.00 pack-year smoking history. She has never used smokeless tobacco. She reports current alcohol use of about 1.0 standard drinks of alcohol per week. She reports that she does not use drugs.   Family History:  The patient's family history includes Asthma in her father; Breast cancer (age of onset: 28) in her daughter; Heart disease in her father; Lung cancer in her sister; Stroke in her father.    ROS:  Please see the history of present illness.   Otherwise, review of systems are positive for difficulties during allergy season.   All other systems are reviewed and negative.    PHYSICAL EXAM: VS:  BP 122/74   Pulse 70   Ht 5\' 4"  (1.626 m)   Wt 194 lb (88 kg)   SpO2 95%   BMI 33.30 kg/m  , BMI Body mass index is 33.3 kg/m. GEN: Well nourished, well developed, in no acute distress  HEENT: normal  Neck: no JVD, carotid bruits, or masses Cardiac: RRR; no murmurs, rubs, or gallops,no edema  Respiratory:  clear to auscultation bilaterally, normal  work of breathing GI: soft, nontender, nondistended, + BS MS: no deformity or atrophy  Skin: warm and dry, no rash Neuro:  Strength and sensation are intact Psych: euthymic mood, full affect   EKG:   The ekg ordered today demonstrates normal sinus rhythm, diffuse nonspecific ST-T wave changes   Recent Labs: No results found for requested labs within last 8760 hours.   Lipid Panel No results found for: CHOL, TRIG, HDL, CHOLHDL, VLDL, LDLCALC, LDLDIRECT   Other studies Reviewed: Additional studies/ records that were reviewed today with results demonstrating: Multiple prior chest CTs were reviewed.  Creatinine 1.29 in April 2021.   ASSESSMENT AND PLAN:  1. Coronary artery calcification: Given her symptoms of chest pressure after walking outside and multiple family members with vascular disease including her daughter who had a carotid stent, I do think she needs further evaluation.  Plan for coronary CTA to get calcium score as well as to evaluate for severe coronary artery disease.  He has renal insufficiency is an issue for CT, could hold losartan around the time of her procedure. 2. Hypertension: Continue losartan.  Continue amlodipine.  Will give her a one-time dose of metoprolol before CT. 3. Hyperlipidemia: Continue fenofibrate.  Total cholesterol 153, triglycerides 118 in April 2021.   Current medicines are reviewed at length with the patient today.  The patient concerns regarding her medicines were addressed.  The following changes have been made:  No change  Labs/ tests ordered today include:   Orders Placed This Encounter  Procedures  . CT CORONARY MORPH W/CTA COR W/SCORE W/CA W/CM &/OR WO/CM  . CT CORONARY FRACTIONAL FLOW RESERVE DATA PREP  . CT CORONARY FRACTIONAL FLOW RESERVE FLUID ANALYSIS  . Basic metabolic panel  . EKG 12-Lead    Recommend 150 minutes/week of aerobic exercise Low fat, low carb, high fiber diet recommended  Disposition:   FU for CT  scan   Signed, Larae Grooms, MD  11/14/2019 3:23 PM    [facility] [place], [place]  [address] Phone: [phone]; Fax: [phone]

## 2019-11-14 NOTE — Patient Instructions (Addendum)
Medication Instructions:  Your physician recommends that you continue on your current medications as directed. Please refer to the Current Medication list given to you today.  *If you need a refill on your cardiac medications before your next appointment, please call your pharmacy*   Lab Work: None ordered  If you have labs (blood work) drawn today and your tests are completely normal, you will receive your results only by: Marland Kitchen MyChart Message (if you have MyChart) OR . A paper copy in the mail If you have any lab test that is abnormal or we need to change your treatment, we will call you to review the results.   Testing/Procedures: Your physician has requested that you have cardiac CT. Cardiac computed tomography (CT) is a painless test that uses an x-ray machine to take clear, detailed pictures of your heart. For further information please visit [email or web address]. Please follow instruction sheet as given.   Follow-Up: Based on test results   Other Instructions Your cardiac CT will be scheduled at one of the below locations:   [facility] [address] [place], [place] [address] [phone]  [facility] [address] [place],  [address] [phone]  If scheduled at [facility], please arrive at the [facility] main entrance of [facility] 30 minutes prior to test start time. Proceed to the [facility] Radiology Department (first floor) to check-in and test prep.  If scheduled at [facility], please arrive 15 mins early for check-in and test prep.  Please follow these instructions carefully (unless otherwise directed):    On the Night Before the Test: . Be sure to Drink plenty of water. . Do not consume any caffeinated/decaffeinated beverages or chocolate 12 hours prior to your test. . Do not take any antihistamines 12 hours prior to your test.   On  the Day of the Test: . Drink plenty of water. Do not drink any water within one hour of the test. . Do not eat any food 4 hours prior to the test. . You may take your regular medications prior to the test.  . Take metoprolol (Lopressor) 50 MG two hours prior to test. . FEMALES- please wear underwire-free bra if available       After the Test: . Drink plenty of water. . After receiving IV contrast, you may experience a mild flushed feeling. This is normal. . On occasion, you may experience a mild rash up to 24 hours after the test. This is not dangerous. If this occurs, you can take Benadryl 25 mg and increase your fluid intake. . If you experience trouble breathing, this can be serious. If it is severe call [phone] IMMEDIATELY. If it is mild, please call our office.   Once we have confirmed authorization from your insurance company, we will call you to set up a date and time for your test.   For non-scheduling related questions, please contact the cardiac imaging nurse navigator should you have any questions/concerns: Marchia Bond, RN Navigator Cardiac Imaging [facility] [phone] office  For scheduling needs, including cancellations and rescheduling, please call [phone].

## 2019-11-27 ENCOUNTER — Encounter (INDEPENDENT_AMBULATORY_CARE_PROVIDER_SITE_OTHER): Payer: Medicare Other | Admitting: Ophthalmology

## 2019-11-27 ENCOUNTER — Other Ambulatory Visit: Payer: Self-pay

## 2019-11-27 DIAGNOSIS — I10 Essential (primary) hypertension: Secondary | ICD-10-CM | POA: Diagnosis not present

## 2019-11-27 DIAGNOSIS — H353132 Nonexudative age-related macular degeneration, bilateral, intermediate dry stage: Secondary | ICD-10-CM

## 2019-11-27 DIAGNOSIS — H34831 Tributary (branch) retinal vein occlusion, right eye, with macular edema: Secondary | ICD-10-CM

## 2019-11-27 DIAGNOSIS — H35033 Hypertensive retinopathy, bilateral: Secondary | ICD-10-CM | POA: Diagnosis not present

## 2019-11-27 DIAGNOSIS — H43813 Vitreous degeneration, bilateral: Secondary | ICD-10-CM

## 2019-11-29 ENCOUNTER — Telehealth: Payer: Self-pay | Admitting: *Deleted

## 2019-11-30 MED ORDER — MOMETASONE FURO-FORMOTEROL FUM 100-5 MCG/ACT IN AERO
2.0000 | INHALATION_SPRAY | Freq: Two times a day (BID) | RESPIRATORY_TRACT | 5 refills | Status: DC
Start: 2019-11-30 — End: 2020-07-18

## 2019-11-30 NOTE — Telephone Encounter (Signed)
Would change to Dulera 100 2 puffs Twice daily   Please let patient know that these changes are requested from her insurance

## 2019-11-30 NOTE — Telephone Encounter (Signed)
Called and spoke with pt letting her know that we were switching her from symb to dulera as symb 80 was no longer covered by insurance. Pt verbalized understanding. Verified preferred pharmacy and sent Rx for Dulera 100 to pharmacy for pt.nothing further needed.

## 2019-12-01 ENCOUNTER — Ambulatory Visit: Payer: Medicare Other | Admitting: Adult Health

## 2019-12-01 ENCOUNTER — Other Ambulatory Visit: Payer: Self-pay

## 2019-12-01 ENCOUNTER — Encounter: Payer: Self-pay | Admitting: Adult Health

## 2019-12-01 VITALS — BP 130/70 | HR 75 | Temp 97.6°F | Ht 62.0 in | Wt 192.4 lb

## 2019-12-01 DIAGNOSIS — R05 Cough: Secondary | ICD-10-CM

## 2019-12-01 DIAGNOSIS — C3491 Malignant neoplasm of unspecified part of right bronchus or lung: Secondary | ICD-10-CM

## 2019-12-01 DIAGNOSIS — J4530 Mild persistent asthma, uncomplicated: Secondary | ICD-10-CM

## 2019-12-01 DIAGNOSIS — R053 Chronic cough: Secondary | ICD-10-CM

## 2019-12-01 MED ORDER — SODIUM CHLORIDE 0.9 % IV SOLN: 80.0000 mg | Freq: Once | INTRAVENOUS | Status: DC

## 2019-12-01 MED ORDER — METHYLPREDNISOLONE ACETATE 80 MG/ML IJ SUSP
80.0000 mg | Freq: Once | INTRAMUSCULAR | Status: AC
  Administered 2019-12-01: 80 mg via INTRAMUSCULAR

## 2019-12-01 NOTE — Progress Notes (Signed)
@Patient  ID: [name], female    DOB: [birth year], 79 y.o.   MRN: [id number]  Chief Complaint  Patient presents with  . Follow-up    Asthma     Referring provider: Hulan Fess, MD  HPI: 79 year old female former smoker followed for chronic cough, lung cancer (adenocarcinoma status post right lower lobectomy August 2015)  TEST/EVENTS :  Spirometry 02/23/13 wnl  - 12/29/2014Sinus CT >Mild chronic sinus disease as described. No acute findings. Left-to-right nasal septal deviation of 3 mm. -med calendar 08/08/13 , redone 11/27/2017and 02/22/2017 - Eos 4% 10/13/2013>rec singulair daily  - FENO 05/12/2016= 24 on singulair  - Allergy profile 05/12/2016>IgE 47 pos RAST ragweed /oak/ birch -CT chest 02/09/2017 no evidence of disease recurrence, no findings of suspicious metastatic disease. Numerous chronic subcentimeter solid pulmonary nodules scattered. -CT chest 02/2018 s/p RLL lobectomy , stable nodules .  12/01/2019 Follow up : Chronic Cough, Asthma  Patient returns for a 63-month follow-up.  Patient has underlying chronic asthma and chronic cough.  She is on Symbicort twice daily and gabapentin twice daily.  Patient says over the last week she has not been doing as well.  She has had increased cough intermittent wheezing.  And her breathing has not been doing as well.  She is going out of town for graduation and is worried that her breathing will not be doing well. She denies any discolored mucus fever chest pain orthopnea PND or increased leg swelling.  No hemoptysis or calf pain.   Allergies  Allergen Reactions  . Grass Extracts [Gramineae Pollens] Other (See Comments)    Unknown  . Nsaids Other (See Comments)    Elevated kidney fx  . Prednisone Nausea And Vomiting and Other (See Comments)    Can take Prednisone injection but not orally    Immunization History  Administered Date(s) Administered  . Fluad Quad(high Dose 65+) 04/03/2019  . Influenza Split  04/05/2013  . Influenza, High Dose Seasonal PF 04/05/2016, 04/05/2018  . Influenza-Unspecified 05/05/2017  . PFIZER SARS-COV-2 Vaccination 08/29/2019, 09/19/2019  . Zoster Recombinat (Shingrix) 05/15/2019    Past Medical History:  Diagnosis Date  .  Multiple pulmonary nodules, largest R mid lung 06/29/2013   Followed in Pulmonary clinic/ [facility]/ [facility] - see CXR 06/27/13  - CT chest 07/14/2013 > 1. No acute findings in the thorax to account for the patient's  symptoms.  2. However, there is a subsolid nodule in the   right lower lobe that has a ground-glass attenuation component  measuring 2.5 x 1.8 cm, and a small central solid component  measuring 4 mm. Initial follow-up by chest CT without   . Adenocarcinoma of lung, stage 1 ([place])    Status post right lower lobectomy August 2015  . Anxiety   . Asthma   . Atrophic vaginitis   . Chronic cough 12/20/2018  . Chronic iritis of left eye    Pupil stays dilated; nonreactive  . Colon polyp   . Contact lens/glasses fitting    wears contacts or glasses  . 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 Left-to-right nasal septal deviation of 3 mm. -med calendar 08/08/13 , redone 06/01/2016 and 02/22/2017  - Eos 4%   10/13/2013  > rec singulair daily  - FENO 05/12/2016  =   24 on singulair  -  Allergy profile 05/12/2016 >   IgE  47 pos  . Depression   . Depression with anxiety   . Dysuria 08/03/2019  . Environmental allergies   . Essential hypertension 02/23/2017   Changed losartan to avapro 02/22/2017 due to cough   . GERD (gastroesophageal reflux disease)   . Hypertriglyceridemia   . Kidney stones   . OA (osteoarthritis)   . Osteopenia   . PONV (postoperative nausea and vomiting)   . S/P lobectomy of lung 02/12/2014  . Status post total knee replacement, left  08/26/2018  . 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 Packs/day: 0.75  . Years: 20.00  . Pack years: 15.00  . Types: Cigarettes  . Quit date: 07/06/1978  . Years since quitting: 41.4  Smokeless Tobacco Never Used   Counseling given: Not Answered   Outpatient Medications Prior to Visit  Medication Sig Dispense Refill  . albuterol (PROAIR HFA) 108 (90 Base) MCG/ACT inhaler Inhale 2 puffs into the lungs every 6 (six) hours as needed for wheezing or shortness of breath. 8 g 5  . amLODipine (NORVASC) 5 MG tablet Take 5 mg by mouth daily after breakfast.    . Ascorbic Acid (VITAMIN C) 100 MG tablet Take 100 mg by mouth daily.    . Calcium-Magnesium-Vitamin D (CALCIUM 1200+D3 PO) Take 1 tablet by mouth daily.     . chlorpheniramine (CHLOR-TRIMETON) 4 MG tablet Take 4 mg by mouth at bedtime.     . Cholecalciferol (VITAMIN D3 PO) Take 1 capsule by mouth daily.    . dorzolamide-timolol (COSOPT) 22.3-6.8 MG/ML ophthalmic solution Place 1 drop into both eyes 2 (two) times daily.    . famotidine (PEPCID) 20 MG tablet Take 20 mg by mouth at bedtime.    . fenofibrate 160 MG tablet Take 160 mg by mouth daily.    Marland Kitchen FLUoxetine (PROZAC) 40 MG capsule Take 40 mg by mouth daily.     Marland Kitchen gabapentin (NEURONTIN) 100 MG capsule Take 1 capsule (100 mg total) by mouth 3 (three) times daily. One three times daily 270 capsule 1  . losartan (COZAAR) 100 MG tablet Take 1 tablet (100 mg total) by mouth daily. 30 tablet 11  . methocarbamol (ROBAXIN) 500 MG tablet Take 1 tablet (500 mg total) by mouth 3 (three) times daily as needed. 60 tablet 1  . metoprolol tartrate (LOPRESSOR) 50 MG tablet Take 1 tablet by mouth 2 hours prior to Cardiac CT 1 tablet 0  . mometasone-formoterol (DULERA) 100-5 MCG/ACT AERO Inhale 2 puffs into the lungs in the morning and at bedtime. 13 g 5  . montelukast (SINGULAIR) 10 MG tablet Take 1 tablet (10 mg  total) by mouth at bedtime. 90 tablet 0  . Multiple Vitamins-Minerals (CENTRUM SILVER PO) Take 1 tablet by mouth daily.    Marland Kitchen omeprazole (PRILOSEC) 40 MG capsule Take 40 mg by mouth daily.    . solifenacin (VESICARE) 10 MG tablet Take 10 mg by mouth daily.    . traZODone (DESYREL) 50 MG tablet Take 50 mg by mouth at bedtime.     No facility-administered medications prior to visit.     Review of Systems:   Constitutional:   No  weight loss, night sweats,  Fevers, chills,  +fatigue, or  lassitude.  HEENT:   No headaches,  Difficulty swallowing,  Tooth/dental problems, or  Sore throat,  No sneezing, itching, ear ache, nasal congestion, post nasal drip,   CV:  No chest pain,  Orthopnea, PND, swelling in lower extremities, anasarca, dizziness, palpitations, syncope.   GI  No heartburn, indigestion, abdominal pain, nausea, vomiting, diarrhea, change in bowel habits, loss of appetite, bloody stools.   Resp:    No chest wall deformity  Skin: no rash or lesions.  GU: no dysuria, change in color of urine, no urgency or frequency.  No flank pain, no hematuria   MS:  No joint pain or swelling.  No decreased range of motion.  No back pain.    Physical Exam  BP 130/70 (BP Location: Left Arm, Cuff Size: Normal)   Pulse 75   Temp 97.6 F (36.4 C) (Oral)   Ht 5\' 2"  (1.575 m)   Wt 192 lb 6.4 oz (87.3 kg)   SpO2 94%   BMI 35.19 kg/m   GEN: A/Ox3; pleasant , NAD, well nourished    HEENT:  [place]/AT, , NOSE-clear, THROAT-clear, no lesions, no postnasal drip or exudate noted.   NECK:  Supple w/ fair ROM; no JVD; normal carotid impulses w/o bruits; no thyromegaly or nodules palpated; no lymphadenopathy.  No stridor  RESP  Clear  P & A; w/o, wheezes/ rales/ or rhonchi. no accessory muscle use, no dullness to percussion  CARD:  RRR, no m/r/g, no peripheral edema, pulses intact, no cyanosis or clubbing.  GI:   Soft & nt; nml bowel sounds; no organomegaly or masses detected.    Musco: Warm bil, no deformities or joint swelling noted.   Neuro: alert, no focal deficits noted.    Skin: Warm, no lesions or rashes    Lab Results:  CBC  BNP No results found for: BNP  ProBNP No results found for: PROBNP  Imaging: No results found.  methylPREDNISolone acetate (DEPO-MEDROL) injection 80 mg    Date Action Dose Route User   12/01/2019 1437 Given 80 mg Intramuscular (Left Upper Outer Quadrant) Hopkins, Amy M      PFT Results Latest Ref Rng & Units 05/18/2017 02/08/2014  FVC-Pre L 1.83 2.17  FVC-Predicted Pre % 69 79  FVC-Post L 1.88 1.99  FVC-Predicted Post % 71 72  Pre FEV1/FVC % % 62 66  Post FEV1/FCV % % 69 72  FEV1-Pre L 1.13 1.43  FEV1-Predicted Pre % 58 69  FEV1-Post L 1.29 1.42  DLCO UNC% % 70 56  DLCO COR %Predicted % 103 89  TLC L 4.98 4.48  TLC % Predicted % 101 91  RV % Predicted % 122 101    Lab Results  Component Value Date   NITRICOXIDE 24 05/18/2017        Assessment & Plan:   Asthma Mild flare with allergic rhinitis Patient is unable to take oral steroids.  We will give a Solu-Medrol IM injection today Add Allegra daily  Plan Patient Instructions  Solumedrol 80mg  IM x 1 today .  Continue on Gabapentin 100mg  1 in am and 2 At bedtime.  Continue on Symbicort 2 puffs Twice daily  , rinse after use.  Robitussin DM As needed  Cough  Continue on Prilosec daily before meal  GERD diet.  Continue on Chlortrimeton 4mg  At bedtime   Continue on Singulair At bedtime   Add Allergra 180mg  in am for 2 weeks and then As needed   Plan for CT chest in August.  Follow up with Dr. Melvyn Novas  Or Acsa Estey NP in 3  months  and As needed  Please contact office for sooner follow up if symptoms do not improve or worsen or seek emergency care       Adenocarcinoma of lung, stage 1 Follow-up CT as planned in August 2021  Chronic cough Continue on aggressive cough control regimen.     Rexene Edison, NP 12/01/2019

## 2019-12-01 NOTE — Patient Instructions (Addendum)
Solumedrol 80mg  IM x 1 today .  Continue on Gabapentin 100mg  1 in am and 2 At bedtime.  Continue on Symbicort 2 puffs Twice daily  , rinse after use.  Robitussin DM As needed  Cough  Continue on Prilosec daily before meal  GERD diet.  Continue on Chlortrimeton 4mg  At bedtime   Continue on Singulair At bedtime   Add Allergra 180mg  in am for 2 weeks and then As needed   Plan for CT chest in August.  Follow up with Dr. Melvyn Novas  Or Mayela Bullard NP in 3  months  and As needed   Please contact office for sooner follow up if symptoms do not improve or worsen or seek emergency care

## 2019-12-01 NOTE — Assessment & Plan Note (Signed)
Mild flare with allergic rhinitis Patient is unable to take oral steroids.  We will give a Solu-Medrol IM injection today Add Allegra daily  Plan Patient Instructions  Solumedrol 80mg  IM x 1 today .  Continue on Gabapentin 100mg  1 in am and 2 At bedtime.  Continue on Symbicort 2 puffs Twice daily  , rinse after use.  Robitussin DM As needed  Cough  Continue on Prilosec daily before meal  GERD diet.  Continue on Chlortrimeton 4mg  At bedtime   Continue on Singulair At bedtime   Add Allergra 180mg  in am for 2 weeks and then As needed   Plan for CT chest in August.  Follow up with Dr. Melvyn Novas  Or Sundeep Cary NP in 3  months  and As needed   Please contact office for sooner follow up if symptoms do not improve or worsen or seek emergency care

## 2019-12-01 NOTE — Assessment & Plan Note (Signed)
Continue on aggressive cough control regimen.

## 2019-12-01 NOTE — Assessment & Plan Note (Signed)
Follow-up CT as planned in August 2021

## 2019-12-07 ENCOUNTER — Other Ambulatory Visit: Payer: Self-pay | Admitting: Internal Medicine

## 2019-12-07 MED ORDER — BUDESONIDE-FORMOTEROL FUMARATE 80-4.5 MCG/ACT IN AERO: 2.0000 | INHALATION_SPRAY | Freq: Two times a day (BID) | RESPIRATORY_TRACT | 3 refills | Status: DC

## 2019-12-08 ENCOUNTER — Other Ambulatory Visit: Payer: Self-pay

## 2019-12-08 ENCOUNTER — Telehealth (HOSPITAL_COMMUNITY): Payer: Self-pay | Admitting: *Deleted

## 2019-12-08 ENCOUNTER — Other Ambulatory Visit: Payer: Medicare Other | Admitting: *Deleted

## 2019-12-08 DIAGNOSIS — R072 Precordial pain: Secondary | ICD-10-CM

## 2019-12-08 DIAGNOSIS — I2510 Atherosclerotic heart disease of native coronary artery without angina pectoris: Secondary | ICD-10-CM

## 2019-12-08 DIAGNOSIS — R0789 Other chest pain: Secondary | ICD-10-CM

## 2019-12-08 NOTE — Telephone Encounter (Signed)

## 2019-12-09 LAB — BASIC METABOLIC PANEL
BUN/Creatinine Ratio: 16 (ref 12–28)
BUN: 19 mg/dL (ref 8–27)
CO2: 24 mmol/L (ref 20–29)
Calcium: 9.5 mg/dL (ref 8.7–10.3)
Chloride: 102 mmol/L (ref 96–106)
Creatinine, Ser: 1.22 mg/dL — ABNORMAL HIGH (ref 0.57–1.00)
GFR calc Af Amer: 49 mL/min/{1.73_m2} — ABNORMAL LOW (ref >59)
GFR calc non Af Amer: 42 mL/min/{1.73_m2} — ABNORMAL LOW (ref >59)
Glucose: 100 mg/dL — ABNORMAL HIGH (ref 65–99)
Potassium: 4.4 mmol/L (ref 3.5–5.2)
Sodium: 140 mmol/L (ref 134–144)

## 2019-12-11 ENCOUNTER — Other Ambulatory Visit: Payer: Self-pay

## 2019-12-11 ENCOUNTER — Ambulatory Visit (HOSPITAL_COMMUNITY)
Admission: RE | Admit: 2019-12-11 | Discharge: 2019-12-11 | Disposition: A | Discharge status: Home / Self Care | Payer: Medicare Other | Source: Ambulatory Visit | Attending: Interventional Cardiology | Admitting: Interventional Cardiology

## 2019-12-11 ENCOUNTER — Other Ambulatory Visit: Payer: Medicare Other

## 2019-12-11 DIAGNOSIS — R072 Precordial pain: Secondary | ICD-10-CM | POA: Diagnosis not present

## 2019-12-11 MED ORDER — IOHEXOL 350 MG/ML SOLN
80.0000 mL | Freq: Once | INTRAVENOUS | Status: AC | PRN
  Administered 2019-12-11: 80 mL via INTRAVENOUS

## 2019-12-11 MED ORDER — NITROGLYCERIN 0.4 MG SL SUBL
SUBLINGUAL_TABLET | SUBLINGUAL | Status: AC
  Filled 2019-12-11: qty 2

## 2019-12-11 MED ORDER — NITROGLYCERIN 0.4 MG SL SUBL: 0.8000 mg | SUBLINGUAL_TABLET | Freq: Once | SUBLINGUAL | Status: DC

## 2020-01-29 ENCOUNTER — Encounter (INDEPENDENT_AMBULATORY_CARE_PROVIDER_SITE_OTHER): Payer: Medicare Other | Admitting: Ophthalmology

## 2020-02-02 ENCOUNTER — Encounter (INDEPENDENT_AMBULATORY_CARE_PROVIDER_SITE_OTHER): Payer: Medicare Other | Admitting: Ophthalmology

## 2020-02-02 ENCOUNTER — Other Ambulatory Visit: Payer: Self-pay

## 2020-02-02 DIAGNOSIS — H43813 Vitreous degeneration, bilateral: Secondary | ICD-10-CM

## 2020-02-02 DIAGNOSIS — H353132 Nonexudative age-related macular degeneration, bilateral, intermediate dry stage: Secondary | ICD-10-CM

## 2020-02-02 DIAGNOSIS — H35033 Hypertensive retinopathy, bilateral: Secondary | ICD-10-CM

## 2020-02-02 DIAGNOSIS — I10 Essential (primary) hypertension: Secondary | ICD-10-CM | POA: Diagnosis not present

## 2020-02-02 DIAGNOSIS — H34831 Tributary (branch) retinal vein occlusion, right eye, with macular edema: Secondary | ICD-10-CM | POA: Diagnosis not present

## 2020-02-05 ENCOUNTER — Telehealth: Payer: Self-pay | Admitting: Adult Health

## 2020-02-05 ENCOUNTER — Other Ambulatory Visit: Payer: Self-pay | Admitting: Internal Medicine

## 2020-02-05 MED ORDER — GABAPENTIN 100 MG PO CAPS: 100.0000 mg | ORAL_CAPSULE | Freq: Three times a day (TID) | ORAL | 0 refills | Status: DC

## 2020-02-05 NOTE — Telephone Encounter (Signed)
Ok to restart gabapentin at previous levels

## 2020-02-05 NOTE — Telephone Encounter (Signed)
Pt's Gabapentin was called into [facility]. Pt was called and informed. Pt states understanding. Nothing further needed at this time.

## 2020-02-05 NOTE — Telephone Encounter (Signed)
Instructions from last OV 12/01/19    Return in about 3 months (around 03/02/2020) for Follow up with Dr. Melvyn Novas. Solumedrol 80mg  IM x 1 today .  Continue on Gabapentin 100mg  1 in am and 2 At bedtime.  Continue on Symbicort 2 puffs Twice daily  , rinse after use.  Robitussin DM As needed  Cough  Continue on Prilosec daily before meal  GERD diet.  Continue on Chlortrimeton 4mg  At bedtime   Continue on Singulair At bedtime   Add Allergra 180mg  in am for 2 weeks and then As needed   Plan for CT chest in August.  Follow up with Dr. Melvyn Novas  Or Par[place] NP in 3  months  and As needed   Please contact office for sooner follow up if symptoms do not improve or worsen or seek emergency care       Called and spoke with pt. Pt is requesting to have her gabapentin Rx refilled. Pt stated that she has not been on it x1 month but stated that her cough has gotten worse not being on it. Dr. Melvyn Novas, please advise if you are okay with [place] refilling med for her. Pt does have a f/u scheduled 8/31 with TP.

## 2020-02-27 ENCOUNTER — Other Ambulatory Visit: Payer: Self-pay | Admitting: *Deleted

## 2020-02-27 DIAGNOSIS — Z85118 Personal history of other malignant neoplasm of bronchus and lung: Secondary | ICD-10-CM

## 2020-02-27 DIAGNOSIS — R918 Other nonspecific abnormal finding of lung field: Secondary | ICD-10-CM

## 2020-03-05 ENCOUNTER — Ambulatory Visit: Payer: Medicare Other | Admitting: Adult Health

## 2020-03-05 ENCOUNTER — Other Ambulatory Visit: Payer: Self-pay

## 2020-03-05 ENCOUNTER — Encounter: Payer: Self-pay | Admitting: Adult Health

## 2020-03-05 DIAGNOSIS — J45991 Cough variant asthma: Secondary | ICD-10-CM

## 2020-03-05 DIAGNOSIS — C3491 Malignant neoplasm of unspecified part of right bronchus or lung: Secondary | ICD-10-CM

## 2020-03-05 NOTE — Progress Notes (Signed)
@Patient  ID: [name], female    DOB: Apr 09, 1941, 80 y.o.   MRN: [id number]  Chief Complaint  Patient presents with  . Follow-up    Asthma     Referring provider: Hulan Fess, MD  HPI: 79 yo female former smoker followed for chronic cough , lung cancer (adenocarcinoma s/p RLL lobectomy 02/2014)   TEST/EVENTS :  Spirometry 02/23/13 wnl  - 12/29/2014Sinus CT >Mild chronic sinus disease as described. No acute findings. Left-to-right nasal septal deviation of 3 mm. -med calendar 08/08/13 , redone 11/27/2017and 02/22/2017 - Eos 4% 10/13/2013>rec singulair daily  - FENO 05/12/2016= 24 on singulair  - Allergy profile 05/12/2016>IgE 47 pos RAST ragweed /oak/ birch -CT chest 02/09/2017 no evidence of disease recurrence, no findings of suspicious metastatic disease. Numerous chronic subcentimeter solid pulmonary nodules scattered. -CT chest 02/2018 s/p RLL lobectomy , stable nodules .  03/05/2020 Follow up : Chronic Cough , Asthma, Lung Cancer  Patient presents for a 57-month follow-up.  Patient has underlying chronic asthma and chronic cough.  She remains on Symbicort twice daily.  Says overall that her breathing is doing okay she try to wean off the gabapentin but cough returned.  She says overall she is doing well with no flare of cough or wheezing. Activity tolerance is at baseline.  No increased albuterol use.  Patient has a known history of lung cancer status post right lower lobectomy.  Is followed for serial CT chest.  She has an upcoming CT next month she denies any hemoptysis or weight loss  Allergies  Allergen Reactions  . Grass Extracts [Gramineae Pollens] Other (See Comments)    Unknown  . Nsaids Other (See Comments)    Elevated kidney fx  . Prednisone Nausea And Vomiting and Other (See Comments)    Can take Prednisone injection but not orally    Immunization History  Administered Date(s) Administered  . Fluad Quad(high Dose 65+) 04/03/2019  .  Influenza Split 04/05/2013  . Influenza, High Dose Seasonal PF 04/05/2016, 04/05/2018  . Influenza-Unspecified 05/05/2017  . PFIZER SARS-COV-2 Vaccination 08/29/2019, 09/19/2019  . Zoster Recombinat (Shingrix) 05/15/2019    Past Medical History:  Diagnosis Date  .  Multiple pulmonary nodules, largest R mid lung 06/29/2013   Followed in Pulmonary clinic/ [facility]/ [facility] - see CXR 06/27/13  - CT chest 07/14/2013 > 1. No acute findings in the thorax to account for the patient's  symptoms.  2. However, there is a subsolid nodule in the   right lower lobe that has a ground-glass attenuation component  measuring 2.5 x 1.8 cm, and a small central solid component  measuring 4 mm. Initial follow-up by chest CT without   . Adenocarcinoma of lung, stage 1 ([place])    Status post right lower lobectomy August 2015  . Anxiety   . Asthma   . Atrophic vaginitis   . Chronic cough 12/20/2018  . Chronic iritis of left eye    Pupil stays dilated; nonreactive  . Colon polyp   . Contact lens/glasses fitting    wears contacts or glasses  . 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 Left-to-right nasal septal deviation of 3 mm. -med calendar 08/08/13 , redone 06/01/2016 and 02/22/2017  - Eos 4%   10/13/2013  > rec singulair daily  - FENO 05/12/2016  =  24 on singulair  - Allergy profile 05/12/2016 >   IgE  47 pos  . Depression   . Depression with anxiety   . Dysuria 08/03/2019  . Environmental allergies   . Essential hypertension 02/23/2017   Changed losartan to avapro 02/22/2017 due to cough   . GERD (gastroesophageal reflux disease)   . Hypertriglyceridemia   . Kidney stones   . OA (osteoarthritis)   . Osteopenia   . PONV (postoperative nausea and vomiting)   . S/P lobectomy of lung 02/12/2014  . Status post total knee  replacement, left 08/26/2018  . 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 Packs/day: 0.75  . Years: 20.00  . Pack years: 15.00  . Types: Cigarettes  . Quit date: 07/06/1978  . Years since quitting: 41.6  Smokeless Tobacco Never Used   Counseling given: Not Answered   Outpatient Medications Prior to Visit  Medication Sig Dispense Refill  . albuterol (PROAIR HFA) 108 (90 Base) MCG/ACT inhaler Inhale 2 puffs into the lungs every 6 (six) hours as needed for wheezing or shortness of breath. 8 g 5  . amLODipine (NORVASC) 5 MG tablet Take 5 mg by mouth daily after breakfast.    . Ascorbic Acid (VITAMIN C) 100 MG tablet Take 100 mg by mouth daily.    . budesonide-formoterol (SYMBICORT) 80-4.5 MCG/ACT inhaler Inhale 2 puffs into the lungs 2 (two) times daily. 3 Inhaler 3  . Calcium-Magnesium-Vitamin D (CALCIUM 1200+D3 PO) Take 1 tablet by mouth daily.     . chlorpheniramine (CHLOR-TRIMETON) 4 MG tablet Take 4 mg by mouth at bedtime.     . Cholecalciferol (VITAMIN D3 PO) Take 1 capsule by mouth daily.    . dorzolamide-timolol (COSOPT) 22.3-6.8 MG/ML ophthalmic solution Place 1 drop into both eyes 2 (two) times daily.    . famotidine (PEPCID) 20 MG tablet Take 20 mg by mouth at bedtime.    . fenofibrate 160 MG tablet Take 160 mg by mouth daily.    Marland Kitchen FLUoxetine (PROZAC) 40 MG capsule Take 40 mg by mouth daily.     Marland Kitchen gabapentin (NEURONTIN) 100 MG capsule Take 1 capsule (100 mg total) by mouth 3 (three) times daily. One three times daily 270 capsule 0  . losartan (COZAAR) 100 MG tablet Take 1 tablet (100 mg total) by mouth daily. 30 tablet 11  . methocarbamol (ROBAXIN) 500 MG tablet Take 1 tablet (500 mg total) by mouth 3 (three) times daily as needed. 60 tablet 1  . metoprolol tartrate (LOPRESSOR) 50 MG tablet Take 1 tablet by mouth 2 hours prior to Cardiac CT 1 tablet 0  . mometasone-formoterol (DULERA) 100-5  MCG/ACT AERO Inhale 2 puffs into the lungs in the morning and at bedtime. 13 g 5  . montelukast (SINGULAIR) 10 MG tablet Take 1 tablet (10 mg total) by mouth at bedtime. 90 tablet 0  . Multiple Vitamins-Minerals (CENTRUM SILVER PO) Take 1 tablet by mouth daily.    Marland Kitchen omeprazole (PRILOSEC) 40 MG capsule Take 40 mg by mouth daily.    . solifenacin (VESICARE) 10 MG tablet Take 10 mg by mouth daily.    . traZODone (DESYREL) 50 MG tablet Take 50 mg by mouth at bedtime.     No facility-administered medications prior to visit.     Review of Systems:   Constitutional:   No  weight loss, night sweats,  Fevers, chills, + fatigue, or  lassitude.  HEENT:   No headaches,  Difficulty swallowing,  Tooth/dental problems, or  Sore throat,                No sneezing, itching, ear ache, nasal congestion, post nasal drip,   CV:  No chest pain,  Orthopnea, PND, swelling in lower extremities, anasarca, dizziness, palpitations, syncope.   GI  No heartburn, indigestion, abdominal pain, nausea, vomiting, diarrhea, change in bowel habits, loss of appetite, bloody stools.   Resp:  .  No chest wall deformity  Skin: no rash or lesions.  GU: no dysuria, change in color of urine, no urgency or frequency.  No flank pain, no hematuria   MS:  No joint pain or swelling.  No decreased range of motion.  No back pain.    Physical Exam  BP 140/76 (BP Location: Left Arm, Cuff Size: Normal)   Pulse 69   Temp 97.9 F (36.6 C) (Temporal)   Ht 5\' 2"  (1.575 m)   Wt 181 lb 9.6 oz (82.4 kg)   SpO2 95% Comment: RA  BMI 33.22 kg/m   GEN: A/Ox3; pleasant , NAD, well nourished    HEENT:  [place]/AT,  , NOSE-clear, THROAT-clear, no lesions, no postnasal drip or exudate noted.   NECK:  Supple w/ fair ROM; no JVD; normal carotid impulses w/o bruits; no thyromegaly or nodules palpated; no lymphadenopathy.    RESP  Clear  P & A; w/o, wheezes/ rales/ or rhonchi. no accessory muscle use, no dullness to percussion  CARD:  RRR,  no m/r/g, no peripheral edema, pulses intact, no cyanosis or clubbing.  GI:   Soft & nt; nml bowel sounds; no organomegaly or masses detected.   Musco: Warm bil, no deformities or joint swelling noted.   Neuro: alert, no focal deficits noted.    Skin: Warm, no lesions or rashes    Lab Results:   BNP No results found for: BNP  ProBNP No results found for: PROBNP  Imaging: No results found.    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  Continue with trigger prevention  Plan  Patient Instructions  Continue on Gabapentin 100mg  1 in am and 2 At bedtime.  Continue on Symbicort 2 puffs Twice daily  , rinse after use.  Robitussin DM As needed  Cough  Continue on Prilosec daily before meal  GERD diet.  Continue on Chlortrimeton 4mg  At bedtime   Continue on Singulair At bedtime   CT chest as planned next month  Follow up with Dr. Melvyn Novas  Or Shelitha Magley NP in 4 months  and As needed   Please contact office for sooner follow up if symptoms do not  improve or worsen or seek emergency care       Adenocarcinoma of lung, stage 1 Continue with serial CT follow-up.     Rexene Edison, NP 03/05/2020

## 2020-03-05 NOTE — Assessment & Plan Note (Signed)
Continue with serial CT follow-up 

## 2020-03-05 NOTE — Patient Instructions (Addendum)
Continue on Gabapentin 100mg  1 in am and 2 At bedtime.  Continue on Symbicort 2 puffs Twice daily  , rinse after use.  Robitussin DM As needed  Cough  Continue on Prilosec daily before meal  GERD diet.  Continue on Chlortrimeton 4mg  At bedtime   Continue on Singulair At bedtime   CT chest as planned next month  Follow up with Dr. Melvyn Novas  Or Ankith Edmonston NP in 4 months  and As needed   Please contact office for sooner follow up if symptoms do not improve or worsen or seek emergency care

## 2020-03-05 NOTE — Assessment & Plan Note (Signed)
Compensated on present regimen.  Continue with trigger prevention  Plan  Patient Instructions  Continue on Gabapentin 100mg  1 in am and 2 At bedtime.  Continue on Symbicort 2 puffs Twice daily  , rinse after use.  Robitussin DM As needed  Cough  Continue on Prilosec daily before meal  GERD diet.  Continue on Chlortrimeton 4mg  At bedtime   Continue on Singulair At bedtime   CT chest as planned next month  Follow up with Dr. Melvyn Novas  Or Alishba Naples NP in 4 months  and As needed   Please contact office for sooner follow up if symptoms do not improve or worsen or seek emergency care

## 2020-03-19 ENCOUNTER — Other Ambulatory Visit: Payer: Self-pay

## 2020-03-19 ENCOUNTER — Ambulatory Visit
Admission: RE | Admit: 2020-03-19 | Discharge: 2020-03-19 | Disposition: A | Discharge status: Home / Self Care | Payer: Medicare Other | Source: Ambulatory Visit | Attending: Thoracic Surgery (Cardiothoracic Vascular Surgery) | Admitting: Thoracic Surgery (Cardiothoracic Vascular Surgery)

## 2020-03-19 ENCOUNTER — Ambulatory Visit: Payer: Medicare Other | Admitting: Thoracic Surgery (Cardiothoracic Vascular Surgery)

## 2020-03-19 ENCOUNTER — Encounter: Payer: Self-pay | Admitting: Thoracic Surgery (Cardiothoracic Vascular Surgery)

## 2020-03-19 VITALS — BP 160/79 | HR 69 | Temp 97.6°F | Resp 20 | Ht 62.0 in | Wt 177.0 lb

## 2020-03-19 DIAGNOSIS — Z85118 Personal history of other malignant neoplasm of bronchus and lung: Secondary | ICD-10-CM

## 2020-03-19 DIAGNOSIS — R918 Other nonspecific abnormal finding of lung field: Secondary | ICD-10-CM | POA: Diagnosis not present

## 2020-03-19 NOTE — Progress Notes (Signed)
[address]       [place], [address]             [phone]     HPI: 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  She had a right lower lobectomy for stage Ia adenocarcinoma in 2015.  She did not require any adjuvant therapy.  She had multiple groundglass opacities bilaterally with the largest being about 3 cm in the right upper lobe.  Those have been followed since 2015 with no significant change over time.  She says that this is been a rough year for her asthma.  The humidity has made it very difficult.  She is not sure if she wants to continue following the nodules because of her age and reluctance to consider another surgical procedure.  She is using her inhalers.  Past Medical History:  Diagnosis Date  .  Multiple pulmonary nodules, largest R mid lung 06/29/2013   Followed in Pulmonary clinic/ [facility]/ [facility] - see CXR 06/27/13  - CT chest 07/14/2013 > 1. No acute findings in the thorax to account for the patient's  symptoms.  2. However, there is a subsolid nodule in the   right lower lobe that has a ground-glass attenuation component  measuring 2.5 x 1.8 cm, and a small central solid component  measuring 4 mm. Initial follow-up by chest CT without   . Adenocarcinoma of lung, stage 1 ([place])    Status post right lower lobectomy August 2015  . Anxiety   . Asthma   . Atrophic vaginitis   . Chronic cough 12/20/2018  . Chronic iritis of left eye    Pupil stays dilated; nonreactive  . Colon polyp   . Contact lens/glasses fitting    wears contacts or glasses  . Cough variant asthma with component of uacs  05/31/2013   Followed in Pulmonary clinic/ [facility]/ [facility]  Onset in her 17's - Spirometry 02/23/13 wnl  - 07/03/2013 Sinus CT > Mild chronic sinus disease - No acute findings. Left-to-right nasal septal deviation of 3 mm. -med calendar 08/08/13 , redone 06/01/2016 and 02/22/2017  - Eos 4%   10/13/2013  > rec singulair daily  - FENO 05/12/2016  =   24 on singulair  - Allergy profile 05/12/2016 >   IgE  47 pos  . Depression   . Depression with anxiety   . Dysuria 08/03/2019  . Environmental allergies   . Essential hypertension 02/23/2017   Changed losartan to avapro 02/22/2017 due to cough   . GERD (gastroesophageal reflux disease)   . Hypertriglyceridemia   . Kidney stones   . OA (osteoarthritis)   . Osteopenia   . PONV (postoperative nausea and vomiting)   . S/P lobectomy of lung 02/12/2014  . 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    Current Outpatient Medications  Medication Sig Dispense Refill  . albuterol (PROAIR HFA) 108 (90 Base) MCG/ACT inhaler Inhale 2 puffs into the lungs every 6 (six) hours as needed for wheezing or shortness of breath. 8 g 5  . amLODipine (NORVASC) 5 MG tablet Take 5 mg by mouth daily after breakfast.    . Ascorbic Acid (VITAMIN C)  100 MG tablet Take 100 mg by mouth daily.    . budesonide-formoterol (SYMBICORT) 80-4.5 MCG/ACT inhaler Inhale 2 puffs into the lungs 2 (two) times daily. 3 Inhaler 3  . Calcium-Magnesium-Vitamin D (CALCIUM 1200+D3 PO) Take 1 tablet by mouth daily.     . chlorpheniramine (CHLOR-TRIMETON) 4 MG tablet Take 4 mg by mouth at bedtime.     . Cholecalciferol (VITAMIN D3 PO) Take 1 capsule by mouth daily.    . dorzolamide-timolol (COSOPT) 22.3-6.8 MG/ML ophthalmic solution Place 1 drop into both eyes 2 (two) times daily.    . famotidine (PEPCID) 20 MG tablet Take 20 mg by mouth at bedtime.    . fenofibrate 160 MG tablet Take 160 mg by mouth daily.    Marland Kitchen FLUoxetine (PROZAC) 40 MG capsule Take 40 mg by mouth daily.     Marland Kitchen gabapentin (NEURONTIN) 100 MG  capsule Take 1 capsule (100 mg total) by mouth 3 (three) times daily. One three times daily 270 capsule 0  . losartan (COZAAR) 100 MG tablet Take 1 tablet (100 mg total) by mouth daily. 30 tablet 11  . methocarbamol (ROBAXIN) 500 MG tablet Take 1 tablet (500 mg total) by mouth 3 (three) times daily as needed. 60 tablet 1  . metoprolol tartrate (LOPRESSOR) 50 MG tablet Take 1 tablet by mouth 2 hours prior to Cardiac CT 1 tablet 0  . mometasone-formoterol (DULERA) 100-5 MCG/ACT AERO Inhale 2 puffs into the lungs in the morning and at bedtime. 13 g 5  . montelukast (SINGULAIR) 10 MG tablet Take 1 tablet (10 mg total) by mouth at bedtime. 90 tablet 0  . Multiple Vitamins-Minerals (CENTRUM SILVER PO) Take 1 tablet by mouth daily.    Marland Kitchen omeprazole (PRILOSEC) 40 MG capsule Take 40 mg by mouth daily.    . solifenacin (VESICARE) 10 MG tablet Take 10 mg by mouth daily.    . traZODone (DESYREL) 50 MG tablet Take 50 mg by mouth at bedtime.     No current facility-administered medications for this visit.    Physical Exam BP (!) 160/79   Pulse 69   Temp 97.6 F (36.4 C) (Skin)   Resp 20   Ht 5\' 2"  (1.575 m)   Wt 177 lb (80.3 kg)   SpO2 95% Comment: RA  BMI 32.42 kg/m  79 year old woman in no acute distress Alert and oriented x3 with no focal deficits No cervical or supraclavicular adenopathy Lungs diminished at right base but otherwise clear Cardiac regular rate and rhythm, no murmur  Diagnostic Tests: CT CHEST WITHOUT CONTRAST  TECHNIQUE: Multidetector CT imaging of the chest was performed following the standard protocol without IV contrast.  COMPARISON:  CT chest of March 01, 2020, cardiac CT of December 11, 2019  FINDINGS: Cardiovascular: Calcified atheromatous plaque of the thoracic aorta. No aneurysmal dilation. Three-vessel coronary artery disease. Mitral annular calcification. No pericardial effusion.  Unremarkable appearance of the central pulmonary vasculature. Limited  assessment of cardiovascular structures given lack of intravenous contrast.  Mediastinum/Nodes: Thoracic inlet structures are normal. No axillary lymphadenopathy. No mediastinal or hilar lymphadenopathy.  Lungs/Pleura: Areas of ground-glass in the RIGHT upper chest with no change along with numerous small pulmonary nodules the largest of these nodules measuring approximately 4 mm in the RIGHT upper lobe.  Largest area of ground-glass 3.1 x 2.8 cm.  Unchanged.  Signs of RIGHT lower lobectomy.  Scattered areas of ground-glass in the LEFT chest also with similar appearance along with small pulmonary nodules  Upper Abdomen: Incidental imaging  of upper abdominal contents without acute process.  Musculoskeletal: No acute musculoskeletal process. Spinal degenerative changes. Compression fracture T12 similar to prior study.  IMPRESSION: 1. Signs of RIGHT lower lobectomy. 2. Areas of ground-glass in the RIGHT upper chest with no change along with numerous small pulmonary nodules the largest of these nodules measuring approximately 4 mm in the RIGHT upper lobe. Stability argues for benign etiology, continued attention on follow-up. 3. Three-vessel coronary artery disease. 4. Aortic atherosclerosis.  Aortic Atherosclerosis (ICD10-I70.0).   Electronically Signed   By: Zetta Bills M.D.   On: 03/19/2020 12:30  I personally reviewed the CT images and concur with the findings noted above.  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  Stage Ia non-small cell carcinoma right lower lobe-6 years out from lobectomy with no evidence of recurrent disease.  Multiple pulmonary nodules and groundglass opacities-there are bilateral solid  nodules that have not changed over the past 6 years.  These are almost certainly benign.  She has multiple groundglass opacities bilaterally.  The largest is in the right upper lobe.  That is unchanged over the past year.  There is no solid component.  These need continued annual follow-up.  Plan: Return in 1 year with CT chest  I spent 20 minutes in review of records, review of images, and in consultation with [name] today. Melrose Nakayama, MD [facility] [phone]

## 2020-03-20 ENCOUNTER — Other Ambulatory Visit: Payer: Self-pay | Admitting: Family Medicine

## 2020-03-20 DIAGNOSIS — Z1231 Encounter for screening mammogram for malignant neoplasm of breast: Secondary | ICD-10-CM

## 2020-03-27 ENCOUNTER — Other Ambulatory Visit: Payer: Self-pay

## 2020-03-27 ENCOUNTER — Ambulatory Visit
Admission: RE | Admit: 2020-03-27 | Discharge: 2020-03-27 | Disposition: A | Discharge status: Home / Self Care | Payer: Medicare Other | Source: Ambulatory Visit | Attending: Family Medicine | Admitting: Family Medicine

## 2020-03-27 DIAGNOSIS — Z1231 Encounter for screening mammogram for malignant neoplasm of breast: Secondary | ICD-10-CM

## 2020-03-31 ENCOUNTER — Other Ambulatory Visit: Payer: Self-pay | Admitting: Internal Medicine

## 2020-04-16 ENCOUNTER — Other Ambulatory Visit: Payer: Self-pay

## 2020-04-16 ENCOUNTER — Encounter (INDEPENDENT_AMBULATORY_CARE_PROVIDER_SITE_OTHER): Payer: Medicare Other | Admitting: Ophthalmology

## 2020-04-16 DIAGNOSIS — H43813 Vitreous degeneration, bilateral: Secondary | ICD-10-CM

## 2020-04-16 DIAGNOSIS — H35033 Hypertensive retinopathy, bilateral: Secondary | ICD-10-CM | POA: Diagnosis not present

## 2020-04-16 DIAGNOSIS — H34831 Tributary (branch) retinal vein occlusion, right eye, with macular edema: Secondary | ICD-10-CM

## 2020-04-16 DIAGNOSIS — H353132 Nonexudative age-related macular degeneration, bilateral, intermediate dry stage: Secondary | ICD-10-CM | POA: Diagnosis not present

## 2020-04-16 DIAGNOSIS — I10 Essential (primary) hypertension: Secondary | ICD-10-CM | POA: Diagnosis not present

## 2020-04-17 ENCOUNTER — Ambulatory Visit: Payer: Medicare Other | Admitting: Dermatology

## 2020-04-24 ENCOUNTER — Ambulatory Visit: Payer: Medicare Other | Admitting: Dermatology

## 2020-06-18 ENCOUNTER — Other Ambulatory Visit: Payer: Self-pay

## 2020-06-18 ENCOUNTER — Encounter (INDEPENDENT_AMBULATORY_CARE_PROVIDER_SITE_OTHER): Payer: Medicare Other | Admitting: Ophthalmology

## 2020-06-18 DIAGNOSIS — H43813 Vitreous degeneration, bilateral: Secondary | ICD-10-CM

## 2020-06-18 DIAGNOSIS — H34831 Tributary (branch) retinal vein occlusion, right eye, with macular edema: Secondary | ICD-10-CM

## 2020-06-18 DIAGNOSIS — H353132 Nonexudative age-related macular degeneration, bilateral, intermediate dry stage: Secondary | ICD-10-CM | POA: Diagnosis not present

## 2020-06-18 DIAGNOSIS — I10 Essential (primary) hypertension: Secondary | ICD-10-CM | POA: Diagnosis not present

## 2020-06-18 DIAGNOSIS — H35033 Hypertensive retinopathy, bilateral: Secondary | ICD-10-CM

## 2020-07-18 ENCOUNTER — Other Ambulatory Visit: Payer: Self-pay

## 2020-07-18 ENCOUNTER — Encounter: Payer: Self-pay | Admitting: Adult Health

## 2020-07-18 ENCOUNTER — Ambulatory Visit (INDEPENDENT_AMBULATORY_CARE_PROVIDER_SITE_OTHER): Payer: Medicare Other | Admitting: Adult Health

## 2020-07-18 DIAGNOSIS — J4541 Moderate persistent asthma with (acute) exacerbation: Secondary | ICD-10-CM | POA: Diagnosis not present

## 2020-07-18 DIAGNOSIS — R918 Other nonspecific abnormal finding of lung field: Secondary | ICD-10-CM

## 2020-07-18 DIAGNOSIS — C3491 Malignant neoplasm of unspecified part of right bronchus or lung: Secondary | ICD-10-CM | POA: Diagnosis not present

## 2020-07-18 MED ORDER — GABAPENTIN 100 MG PO CAPS: 100.0000 mg | ORAL_CAPSULE | Freq: Three times a day (TID) | ORAL | 3 refills | Status: DC

## 2020-07-18 NOTE — Assessment & Plan Note (Signed)
Stable on CT chest , CT chest planned in 1 year

## 2020-07-18 NOTE — Assessment & Plan Note (Signed)
Flare with chronic cough - continue on trigger prevention  Discussed additional testing with repeat PFT / allergy labs, patient wants to avoid this at this time . Will try to restart on Gabapentin to see if cough is under better control   Plan   Patient Instructions  Restart Gabapentin 100mg  1 in am and 2 At bedtime.  Continue on Symbicort 2 puffs Twice daily  , rinse after use.  Robitussin DM As needed  Cough  Continue on Prilosec daily before meal  GERD diet.  Continue on Chlortrimeton 4mg  At bedtime   Continue on Singulair At bedtime   Follow up with Dr. Melvyn Novas  Or Jennalynn Rivard NP in 2-3 months  and As needed   Please contact office for sooner follow up if symptoms do not improve or worsen or seek emergency care

## 2020-07-18 NOTE — Assessment & Plan Note (Signed)
Appears stable  Serial CT shows no sign of recurrence .  CT planned in 1 year .

## 2020-07-18 NOTE — Patient Instructions (Addendum)
Restart Gabapentin 100mg  1 in am and 2 At bedtime.  Continue on Symbicort 2 puffs Twice daily  , rinse after use.  Robitussin DM As needed  Cough  Continue on Prilosec daily before meal  GERD diet.  Continue on Chlortrimeton 4mg  At bedtime   Continue on Singulair At bedtime   Follow up with Dr. Melvyn Novas  Or Ashland Wiseman NP in 2-3 months  and As needed   Please contact office for sooner follow up if symptoms do not improve or worsen or seek emergency care

## 2020-07-18 NOTE — Progress Notes (Signed)
@Patient  ID: [name], female    DOB: 1941/02/13, 80 y.o.   MRN: [id number]  Chief Complaint  Patient presents with  . Asthma    Referring provider: Hulan Fess, MD  HPI: 80 year old female former smoker followed for chronic cough, asthma, lung cancer,  (stage Ia adenocarcinoma status post right lower lobectomy August 2015), and lung nodules  TEST/EVENTS :  Spirometry 02/23/13 wnl  - 12/29/2014Sinus CT >Mild chronic sinus disease as described. No acute findings. Left-to-right nasal septal deviation of 3 mm. -med calendar 08/08/13 , redone 11/27/2017and 02/22/2017 - Eos 4% 10/13/2013>rec singulair daily  - FENO 05/12/2016= 24 on singulair  - Allergy profile 05/12/2016>IgE 47 pos RAST ragweed /oak/ birch -CT chest 02/09/2017 no evidence of disease recurrence, no findings of suspicious metastatic disease. Numerous chronic subcentimeter solid pulmonary nodules scattered. -CT chest 02/2018 s/p RLL lobectomy , stable nodules .  07/18/2020 Follow up : Chronic cough, asthma, lung cancer patient presents for a 90-month follow-up.  Patient has underlying asthma and chronic cough.  She remains on Symbicort twice daily. Patient had previously been on gabapentin for chronic cough. Patient states she weaned off of this over the last several months. But cough has come back and anytime she does any type of activity she has increased cough. She says the cough limits her activities. She is also not been using any cough syrups. She remains on her allergy medicine with Singulair daily and Chlor-Trimeton at bedtime. She does most days use albuterol at least once to 2 times a day. She denies any chest pain orthopnea edema or hemoptysis.  Patient has a history of lung cancer status post right lower lobectomy in August 2015.  She is followed radiographically with serial CT chest.  CT chest September 2021 showed areas of groundglass in the right upper chest with no change along with numerous  small pulmonary nodules largest approximately 4 mm.  Largest area of groundglass 3.1 x 2.8 cm is unchanged.  Scattered areas of groundglass in the left chest also with similar appearance Patient says overall she is doing okay.  No hemoptysis or unintentional weight loss She is followed by thoracic surgery.  There is a plan for a follow-up CT chest in 1 year.  Lives alone. Independent. Drives. Active at home.   Allergies  Allergen Reactions  . Grass Extracts [Gramineae Pollens] Other (See Comments)    Unknown  . Nsaids Other (See Comments)    Elevated kidney fx  . Prednisone Nausea And Vomiting and Other (See Comments)    Can take Prednisone injection but not orally    Immunization History  Administered Date(s) Administered  . Fluad Quad(high Dose 65+) 04/03/2019  . Influenza Split 05/13/2009, 04/05/2013  . Influenza, High Dose Seasonal PF 04/05/2016, 04/05/2018  . Influenza,inj,Quad PF,6+ Mos 05/11/2011  . Influenza-Unspecified 05/05/2017  . PFIZER SARS-COV-2 Vaccination 08/29/2019, 09/19/2019, 04/30/2020  . Pneumococcal Conjugate-13 06/14/2014  . Pneumococcal Polysaccharide-23 08/27/2005  . Td 08/27/2005  . Tdap 05/18/2012  . Zoster 10/03/2008  . Zoster Recombinat (Shingrix) 05/15/2019    Past Medical History:  Diagnosis Date  .  Multiple pulmonary nodules, largest R mid lung 06/29/2013   Followed in Pulmonary clinic/ [facility]/ [facility] - see CXR 06/27/13  - CT chest 07/14/2013 > 1. No acute findings in the thorax to account for the patient's  symptoms.  2. However, there is a subsolid nodule in the   right lower lobe that has a ground-glass attenuation component  measuring 2.5 x 1.8 cm, and  a small central solid component  measuring 4 mm. Initial follow-up by chest CT without   . Adenocarcinoma of lung, stage 1 ([place])    Status post right lower lobectomy August 2015  . Anxiety   . Asthma   . Atrophic vaginitis   . Chronic cough 12/20/2018  . Chronic iritis of left eye     Pupil stays dilated; nonreactive  . Colon polyp   . Contact lens/glasses fitting    wears contacts or glasses  . Cough variant asthma with component of uacs  05/31/2013   Followed in Pulmonary clinic/ [facility]/ [facility] Onset in her 5's - Spirometry 02/23/13 wnl  - 07/03/2013 Sinus CT > Mild chronic sinus disease - No acute findings. Left-to-right nasal septal deviation of 3 mm. -med calendar 08/08/13 , redone 06/01/2016 and 02/22/2017  - Eos 4%   10/13/2013  > rec singulair daily  - FENO 05/12/2016  =   24 on singulair  - Allergy profile 05/12/2016 >   IgE  47 pos  . Depression   . Depression with anxiety   . Dysuria 08/03/2019  . Environmental allergies   . Essential hypertension 02/23/2017   Changed losartan to avapro 02/22/2017 due to cough   . GERD (gastroesophageal reflux disease)   . Hypertriglyceridemia   . Kidney stones   . OA (osteoarthritis)   . Osteopenia   . PONV (postoperative nausea and vomiting)   . S/P lobectomy of lung 02/12/2014  . 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 Packs/day: 0.75  . Years: 20.00  . Pack years: 15.00  . Types: Cigarettes  . Quit date: 07/06/1978  . Years since quitting: 42.0  Smokeless Tobacco Never Used   Counseling given: Not Answered   Outpatient Medications Prior to Visit  Medication Sig Dispense Refill  . albuterol (PROAIR HFA) 108 (90 Base) MCG/ACT inhaler Inhale 2 puffs into the lungs every 6 (six) hours as needed for wheezing or shortness of breath. 8 g 5  . amLODipine (NORVASC) 5 MG tablet Take 5 mg by mouth daily after breakfast.    . Ascorbic Acid (VITAMIN C) 100 MG tablet Take 100 mg by mouth daily.    . Calcium-Magnesium-Vitamin D (CALCIUM 1200+D3 PO) Take 1 tablet by mouth daily.     . chlorpheniramine (CHLOR-TRIMETON) 4 MG tablet Take 4 mg by mouth at bedtime.     . Cholecalciferol  (VITAMIN D3 PO) Take 1 capsule by mouth daily.    . dorzolamide-timolol (COSOPT) 22.3-6.8 MG/ML ophthalmic solution Place 1 drop into both eyes 2 (two) times daily.    . famotidine (PEPCID) 20 MG tablet Take 20 mg by mouth at bedtime.    . fenofibrate 160 MG tablet Take 160 mg by mouth daily.    Marland Kitchen FLUoxetine (PROZAC) 40 MG capsule Take 40 mg by mouth daily.     Marland Kitchen losartan (COZAAR) 100 MG tablet Take 1 tablet (100 mg total) by mouth daily. 30 tablet 11  . methocarbamol (ROBAXIN) 500 MG tablet Take 1 tablet (500 mg total) by mouth 3 (three) times daily as needed. 60 tablet 1  . metoprolol tartrate (LOPRESSOR) 50 MG tablet Take 1 tablet by mouth 2 hours prior to Cardiac CT 1 tablet 0  . montelukast (SINGULAIR) 10 MG tablet Take 1 tablet (10 mg total) by mouth at bedtime. 90 tablet 0  .  Multiple Vitamins-Minerals (CENTRUM SILVER PO) Take 1 tablet by mouth daily.    Marland Kitchen omeprazole (PRILOSEC) 40 MG capsule Take 40 mg by mouth daily.    . solifenacin (VESICARE) 10 MG tablet Take 10 mg by mouth daily.    . SYMBICORT 80-4.5 MCG/ACT inhaler INHALE 2 PUFFS BY MOUTH INTO THE LUNGS DAILY 10.2 each 12  . traZODone (DESYREL) 50 MG tablet Take 50 mg by mouth at bedtime.    . gabapentin (NEURONTIN) 100 MG capsule Take 1 capsule (100 mg total) by mouth 3 (three) times daily. One three times daily 270 capsule 0  . mometasone-formoterol (DULERA) 100-5 MCG/ACT AERO Inhale 2 puffs into the lungs in the morning and at bedtime. (Patient not taking: Reported on 07/18/2020) 13 g 5   No facility-administered medications prior to visit.     Review of Systems:   Constitutional:   No  weight loss, night sweats,  Fevers, chills, + fatigue, or  lassitude.  HEENT:   No headaches,  Difficulty swallowing,  Tooth/dental problems, or  Sore throat,                No sneezing, itching, ear ache, nasal congestion, post nasal drip,   CV:  No chest pain,  Orthopnea, PND, swelling in lower extremities, anasarca, dizziness,  palpitations, syncope.   GI  No heartburn, indigestion, abdominal pain, nausea, vomiting, diarrhea, change in bowel habits, loss of appetite, bloody stools.   Resp:  .  No chest wall deformity  Skin: no rash or lesions.  GU: no dysuria, change in color of urine, no urgency or frequency.  No flank pain, no hematuria   MS:  No joint pain or swelling.  No decreased range of motion.  No back pain.    Physical Exam  BP 128/80 (BP Location: Left Arm, Cuff Size: Normal)   Pulse 64   Temp 98.1 F (36.7 C)   Ht 5\' 3"  (1.6 m)   Wt 177 lb 12.8 oz (80.6 kg)   SpO2 98%   BMI 31.50 kg/m   GEN: A/Ox3; pleasant , NAD, well nourished    HEENT:  /AT,    NOSE-clear, THROAT-clear, no lesions, no postnasal drip or exudate noted.   NECK:  Supple w/ fair ROM; no JVD; normal carotid impulses w/o bruits; no thyromegaly or nodules palpated; no lymphadenopathy.    RESP few trace rhonchi ,wheezes/ rales/ or rhonchi. no accessory muscle use, no dullness to percussion  CARD:  RRR, no m/r/g, no peripheral edema, pulses intact, no cyanosis or clubbing.  GI:   Soft & nt; nml bowel sounds; no organomegaly or masses detected.   Musco: Warm bil, no deformities or joint swelling noted.   Neuro: alert, no focal deficits noted.    Skin: Warm, no lesions or rashes    Lab Results:  BMET  BNP No results found for: BNP  ProBNP No results found for: PROBNP  Imaging: No results found.    PFT Results Latest Ref Rng & Units 05/18/2017 02/08/2014  FVC-Pre L 1.83 2.17  FVC-Predicted Pre % 69 79  FVC-Post L 1.88 1.99  FVC-Predicted Post % 71 72  Pre FEV1/FVC % % 62 66  Post FEV1/FCV % % 69 72  FEV1-Pre L 1.13 1.43  FEV1-Predicted Pre % 58 69  FEV1-Post L 1.29 1.42  DLCO uncorrected ml/min/mmHg 16.19 13.01  DLCO UNC% % 70 56  DLCO corrected ml/min/mmHg 16.79 13.01  DLCO COR %Predicted % 73 56  DLVA Predicted % 103 89  TLC L 4.98 4.48  TLC % Predicted % 101 91  RV % Predicted % 122 101     Lab Results  Component Value Date   NITRICOXIDE 24 05/18/2017        Assessment & Plan:   Asthma Flare with chronic cough - continue on trigger prevention  Discussed additional testing with repeat PFT / allergy labs, patient wants to avoid this at this time . Will try to restart on Gabapentin to see if cough is under better control   Plan   Patient Instructions  Restart Gabapentin 100mg  1 in am and 2 At bedtime.  Continue on Symbicort 2 puffs Twice daily  , rinse after use.  Robitussin DM As needed  Cough  Continue on Prilosec daily before meal  GERD diet.  Continue on Chlortrimeton 4mg  At bedtime   Continue on Singulair At bedtime   Follow up with Dr. Melvyn Novas  Or Carena Stream NP in 2-3 months  and As needed   Please contact office for sooner follow up if symptoms do not improve or worsen or seek emergency care       Adenocarcinoma of lung, stage 1 Appears stable  Serial CT shows no sign of recurrence .  CT planned in 1 year .    Multiple pulmonary nodules, largest R mid lung Stable on CT chest , CT chest planned in 1 year      Rexene Edison, NP 07/18/2020

## 2020-08-22 ENCOUNTER — Ambulatory Visit: Payer: Medicare Other | Admitting: Physician Assistant

## 2020-09-03 ENCOUNTER — Other Ambulatory Visit: Payer: Self-pay

## 2020-09-03 ENCOUNTER — Encounter (INDEPENDENT_AMBULATORY_CARE_PROVIDER_SITE_OTHER): Payer: Medicare Other | Admitting: Ophthalmology

## 2020-09-03 DIAGNOSIS — I10 Essential (primary) hypertension: Secondary | ICD-10-CM | POA: Diagnosis not present

## 2020-09-03 DIAGNOSIS — H353132 Nonexudative age-related macular degeneration, bilateral, intermediate dry stage: Secondary | ICD-10-CM | POA: Diagnosis not present

## 2020-09-03 DIAGNOSIS — H35033 Hypertensive retinopathy, bilateral: Secondary | ICD-10-CM

## 2020-09-03 DIAGNOSIS — H43813 Vitreous degeneration, bilateral: Secondary | ICD-10-CM

## 2020-09-03 DIAGNOSIS — H34831 Tributary (branch) retinal vein occlusion, right eye, with macular edema: Secondary | ICD-10-CM

## 2020-09-11 DIAGNOSIS — Z85118 Personal history of other malignant neoplasm of bronchus and lung: Secondary | ICD-10-CM | POA: Diagnosis not present

## 2020-09-11 DIAGNOSIS — J45909 Unspecified asthma, uncomplicated: Secondary | ICD-10-CM | POA: Diagnosis not present

## 2020-09-24 ENCOUNTER — Ambulatory Visit: Payer: Medicare Other | Admitting: Physician Assistant

## 2020-10-03 ENCOUNTER — Ambulatory Visit: Payer: Medicare Other | Admitting: Adult Health

## 2020-10-08 ENCOUNTER — Encounter: Payer: Self-pay | Admitting: Adult Health

## 2020-10-08 ENCOUNTER — Other Ambulatory Visit: Payer: Self-pay

## 2020-10-08 ENCOUNTER — Ambulatory Visit: Payer: Medicare Other | Admitting: Adult Health

## 2020-10-08 DIAGNOSIS — C3490 Malignant neoplasm of unspecified part of unspecified bronchus or lung: Secondary | ICD-10-CM

## 2020-10-08 DIAGNOSIS — J4530 Mild persistent asthma, uncomplicated: Secondary | ICD-10-CM

## 2020-10-08 DIAGNOSIS — R053 Chronic cough: Secondary | ICD-10-CM | POA: Diagnosis not present

## 2020-10-08 NOTE — Assessment & Plan Note (Signed)
Patient is doing well.  Follow-up serial CTs have been negative thus far.  She is continue up with her yearly CT and follow-up.

## 2020-10-08 NOTE — Patient Instructions (Signed)
Continue on Gabapentin 100mg  1 in am and 2 At bedtime.  Continue on Symbicort 2 puffs Twice daily  , rinse after use.  Robitussin DM As needed  Cough  Continue on Prilosec daily before meal  GERD diet.  Continue on Chlortrimeton 4mg  At bedtime   Remain off Singulair for now .   Follow up with Dr. Melvyn Novas  Or Conchetta Lamia NP in 4 months  and As needed   Please contact office for sooner follow up if symptoms do not improve or worsen or seek emergency care

## 2020-10-08 NOTE — Assessment & Plan Note (Signed)
Chronic cough is much improved.  Since restarting gabapentin.  We will continue on low-dose 100 mg in the morning 200 mg at bedtime.  Continue on trigger prevention and cough control regimen.  Plan  Patient Instructions  Continue on Gabapentin 100mg  1 in am and 2 At bedtime.  Continue on Symbicort 2 puffs Twice daily  , rinse after use.  Robitussin DM As needed  Cough  Continue on Prilosec daily before meal  GERD diet.  Continue on Chlortrimeton 4mg  At bedtime   Remain off Singulair for now .   Follow up with Dr. Melvyn Novas  Or Chanler Mendonca NP in 4 months  and As needed   Please contact office for sooner follow up if symptoms do not improve or worsen or seek emergency care

## 2020-10-08 NOTE — Assessment & Plan Note (Signed)
Recent flare now improved and back to baseline No flare off of Singulair will remain off of for now  Plan  Patient Instructions  Continue on Gabapentin 100mg  1 in am and 2 At bedtime.  Continue on Symbicort 2 puffs Twice daily  , rinse after use.  Robitussin DM As needed  Cough  Continue on Prilosec daily before meal  GERD diet.  Continue on Chlortrimeton 4mg  At bedtime   Remain off Singulair for now .   Follow up with [name]  Or [name] in 4 months  and As needed   Please contact office for sooner follow up if symptoms do not improve or worsen or seek emergency care    ]

## 2020-10-08 NOTE — Progress Notes (Signed)
@Patient  ID: [name], female    DOB: 1941-06-27, 80 y.o.   MRN: [id number]  Chief Complaint  Patient presents with  . 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 No acute findings. Left-to-right nasal septal deviation of 3 mm. -med calendar 08/08/13 , redone 11/27/2017and 02/22/2017 - Eos 4% 10/13/2013>rec singulair daily  - FENO 05/12/2016= 24 on singulair  - Allergy profile 05/12/2016>IgE 47 pos RAST ragweed /oak/ birch -CT chest 02/09/2017 no evidence of disease recurrence, no findings of suspicious metastatic disease. Numerous chronic subcentimeter solid pulmonary nodules scattered. -CT chest 02/2018 s/p RLL lobectomy , stable nodules .  10/08/2020 Follow up : Chronic cough, asthma, lung cancer Patient presents for a 48-month follow-up.  Patient was seen last visit with a flare of her chronic cough/minimally productive dry cough.  She had previously been doing well on gabapentin.  She was restarted on gabapentin 100 mg in the morning and 200 mg at bedtime.  She was continued on Symbicort.  She was also continued on Chlor-Trimeton and Singulair.  Since last visit patient is feeling much better.  Cough is back to her baseline. Says she has very minimal cough right now.  She says she is feeling very good and has increased energy level.  Says she has been off of Singulair for several weeks now.  Has not noticed any difference in her breathing.  Would like to remain off of this for now. Plans to go to [place] this summer with her friend.  Patient is widowed.   Allergies  Allergen Reactions  . Grass Extracts [Gramineae Pollens] Other (See Comments)    Unknown  . Nsaids Other (See  Comments)    Elevated kidney fx  . Prednisone Nausea And Vomiting and Other (See Comments)    Can take Prednisone injection but not orally    Immunization History  Administered Date(s) Administered  . Fluad Quad(high Dose 65+) 04/03/2019  . Influenza Split 05/13/2009, 04/05/2013  . Influenza, High Dose Seasonal PF 04/05/2016, 04/05/2018  . Influenza,inj,Quad PF,6+ Mos 05/11/2011  . Influenza-Unspecified 05/05/2017  . PFIZER(Purple Top)SARS-COV-2 Vaccination 08/29/2019, 09/19/2019, 04/30/2020  . Pneumococcal Conjugate-13 06/14/2014  . Pneumococcal Polysaccharide-23 08/27/2005  . Td 08/27/2005  . Tdap 05/18/2012  . Zoster 10/03/2008  . Zoster Recombinat (Shingrix) 05/15/2019    Past Medical History:  Diagnosis Date  .  Multiple pulmonary nodules, largest R mid lung 06/29/2013   Followed in Pulmonary clinic/ [facility]/ [place] - see CXR 06/27/13  - CT chest 07/14/2013 > 1. No acute findings in the thorax to account for the patient's  symptoms.  2. However, there is a subsolid nodule in the   right lower lobe that has a ground-glass attenuation component  measuring 2.5 x 1.8 cm, and a small central solid component  measuring 4 mm. Initial follow-up by chest CT without   . Adenocarcinoma of lung, stage 1 ([place])    Status post right lower lobectomy August 2015  . Anxiety   . Asthma   . Atrophic vaginitis   . Chronic cough 12/20/2018  . Chronic iritis of left eye    Pupil stays dilated; nonreactive  . Colon polyp   . Contact lens/glasses fitting    wears contacts or glasses  .  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 Left-to-right nasal septal deviation of 3 mm. -med calendar 08/08/13 , redone 06/01/2016 and 02/22/2017  - Eos 4%   10/13/2013  > rec singulair daily  - FENO 05/12/2016  =   24 on singulair   - Allergy profile 05/12/2016 >   IgE  47 pos  . Depression   . Depression with anxiety   . Dysuria 08/03/2019  . Environmental allergies   . Essential hypertension 02/23/2017   Changed losartan to avapro 02/22/2017 due to cough   . GERD (gastroesophageal reflux disease)   . Hypertriglyceridemia   . Kidney stones   . OA (osteoarthritis)   . Osteopenia   . PONV (postoperative nausea and vomiting)   . S/P lobectomy of lung 02/12/2014  . 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 Packs/day: 0.75  . Years: 20.00  . Pack years: 15.00  . Types: Cigarettes  . Quit date: 07/06/1978  . Years since quitting: 42.2  Smokeless Tobacco Never Used   Counseling given: Not Answered   Outpatient Medications Prior to Visit  Medication Sig Dispense Refill  . albuterol (PROAIR HFA) 108 (90 Base) MCG/ACT inhaler Inhale 2 puffs into the lungs every 6 (six) hours as needed for wheezing or shortness of breath. 8 g 5  . amLODipine (NORVASC) 5 MG tablet Take 5 mg by mouth daily after breakfast.    . Ascorbic Acid (VITAMIN C) 100 MG tablet Take 100 mg by mouth daily.    . Calcium-Magnesium-Vitamin D (CALCIUM 1200+D3 PO) Take 1 tablet by mouth daily.     . chlorpheniramine (CHLOR-TRIMETON) 4 MG tablet Take 4 mg by mouth at bedtime.     . Cholecalciferol (VITAMIN D3 PO) Take 1 capsule by mouth daily.    . dorzolamide-timolol (COSOPT) 22.3-6.8 MG/ML ophthalmic solution Place 1 drop into both eyes 2 (two) times daily.    . famotidine (PEPCID) 20 MG tablet Take 20 mg by mouth at bedtime.    . fenofibrate 160 MG tablet Take 160 mg by mouth daily.    Marland Kitchen FLUoxetine (PROZAC) 40 MG capsule Take 40 mg by mouth daily.     Marland Kitchen gabapentin (NEURONTIN) 100 MG capsule Take 1 capsule (100 mg total) by mouth 3 (three) times daily. One three times daily 270 capsule 3  . losartan (COZAAR) 100 MG  tablet Take 1 tablet (100 mg total) by mouth daily. 30 tablet 11  . methocarbamol (ROBAXIN) 500 MG tablet Take 1 tablet (500 mg total) by mouth 3 (three) times daily as needed. 60 tablet 1  . metoprolol tartrate (LOPRESSOR) 50 MG tablet Take 1 tablet by mouth 2 hours prior to Cardiac CT 1 tablet 0  . Multiple Vitamins-Minerals (CENTRUM SILVER PO) Take 1 tablet by mouth daily.    Marland Kitchen omeprazole (PRILOSEC) 40 MG capsule Take 40 mg by mouth daily.    . solifenacin (VESICARE) 10 MG tablet Take 10 mg by mouth daily.    . SYMBICORT 80-4.5 MCG/ACT inhaler INHALE 2 PUFFS BY MOUTH INTO THE LUNGS DAILY 10.2 each 12  . traZODone (DESYREL) 50 MG tablet Take 50 mg by mouth at bedtime.    Marland Kitchen  montelukast (SINGULAIR) 10 MG tablet Take 1 tablet (10 mg total) by mouth at bedtime. (Patient not taking: Reported on 10/08/2020) 90 tablet 0   No facility-administered medications prior to visit.     Review of Systems:   Constitutional:   No  weight loss, night sweats,  Fevers, chills, fatigue, or  lassitude.  HEENT:   No headaches,  Difficulty swallowing,  Tooth/dental problems, or  Sore throat,                No sneezing, itching, ear ache, nasal congestion, post nasal drip,   CV:  No chest pain,  Orthopnea, PND, swelling in lower extremities, anasarca, dizziness, palpitations, syncope.   GI  No heartburn, indigestion, abdominal pain, nausea, vomiting, diarrhea, change in bowel habits, loss of appetite, bloody stools.   Resp:   No excess mucus, no productive cough,  No non-productive cough,  No coughing up of blood.  No change in color of mucus.  No wheezing.  No chest wall deformity  Skin: no rash or lesions.  GU: no dysuria, change in color of urine, no urgency or frequency.  No flank pain, no hematuria   MS:  No joint pain or swelling.  No decreased range of motion.  No back pain.    Physical Exam  BP 130/78 (BP Location: Left Arm, Cuff Size: Normal)   Pulse 70   Temp (!) 97.3 F (36.3 C)   Ht 5\' 3"   (1.6 m)   Wt 180 lb (81.6 kg)   SpO2 96%   BMI 31.89 kg/m   GEN: A/Ox3; pleasant , NAD, well nourished    HEENT:  [place]/AT,  NOSE-clear, THROAT-clear, no lesions, no postnasal drip or exudate noted.   NECK:  Supple w/ fair ROM; no JVD; normal carotid impulses w/o bruits; no thyromegaly or nodules palpated; no lymphadenopathy.    RESP  Clear  P & A; w/o, wheezes/ rales/ or rhonchi. no accessory muscle use, no dullness to percussion  CARD:  RRR, no m/r/g, no peripheral edema, pulses intact, no cyanosis or clubbing.  GI:   Soft & nt; nml bowel sounds; no organomegaly or masses detected.   Musco: Warm bil, no deformities or joint swelling noted.   Neuro: alert, no focal deficits noted.    Skin: Warm, no lesions or rashes    Lab Results:  CBC  BNP No results found for: BNP  ProBNP No results found for: PROBNP  Imaging: No results found.    PFT Results Latest Ref Rng & Units 05/18/2017 02/08/2014  FVC-Pre L 1.83 2.17  FVC-Predicted Pre % 69 79  FVC-Post L 1.88 1.99  FVC-Predicted Post % 71 72  Pre FEV1/FVC % % 62 66  Post FEV1/FCV % % 69 72  FEV1-Pre L 1.13 1.43  FEV1-Predicted Pre % 58 69  FEV1-Post L 1.29 1.42  DLCO uncorrected ml/min/mmHg 16.19 13.01  DLCO UNC% % 70 56  DLCO corrected ml/min/mmHg 16.79 13.01  DLCO COR %Predicted % 73 56  DLVA Predicted % 103 89  TLC L 4.98 4.48  TLC % Predicted % 101 91  RV % Predicted % 122 101    Lab Results  Component Value Date   NITRICOXIDE 24 05/18/2017        Assessment & Plan:   Asthma Recent flare now improved and back to baseline No flare off of Singulair will remain off of for now  Plan  Patient Instructions  Continue on Gabapentin 100mg  1 in am and 2 At bedtime.  Continue on Symbicort 2 puffs Twice daily  , rinse after use.  Robitussin DM As needed  Cough  Continue on Prilosec daily before meal  GERD diet.  Continue on Chlortrimeton 4mg  At bedtime   Remain off Singulair for now .   Follow up  with Dr. Melvyn Novas  Or Keeghan Mcintire NP in 4 months  and As needed   Please contact office for sooner follow up if symptoms do not improve or worsen or seek emergency care    ]   Adenocarcinoma of lung, stage 1 Patient is doing well.  Follow-up serial CTs have been negative thus far.  She is continue up with her yearly CT and follow-up.  Chronic cough Chronic cough is much improved.  Since restarting gabapentin.  We will continue on low-dose 100 mg in the morning 200 mg at bedtime.  Continue on trigger prevention and cough control regimen.  Plan  Patient Instructions  Continue on Gabapentin 100mg  1 in am and 2 At bedtime.  Continue on Symbicort 2 puffs Twice daily  , rinse after use.  Robitussin DM As needed  Cough  Continue on Prilosec daily before meal  GERD diet.  Continue on Chlortrimeton 4mg  At bedtime   Remain off Singulair for now .   Follow up with Dr. Melvyn Novas  Or Kaymarie Wynn NP in 4 months  and As needed   Please contact office for sooner follow up if symptoms do not improve or worsen or seek emergency care          Rexene Edison, NP 10/08/2020

## 2020-10-09 ENCOUNTER — Encounter: Payer: Self-pay | Admitting: Physician Assistant

## 2020-10-09 ENCOUNTER — Ambulatory Visit: Payer: Medicare Other | Admitting: Physician Assistant

## 2020-10-09 ENCOUNTER — Other Ambulatory Visit: Payer: Self-pay

## 2020-10-09 DIAGNOSIS — L821 Other seborrheic keratosis: Secondary | ICD-10-CM

## 2020-10-09 DIAGNOSIS — L814 Other melanin hyperpigmentation: Secondary | ICD-10-CM

## 2020-10-09 DIAGNOSIS — D1800 Hemangioma unspecified site: Secondary | ICD-10-CM

## 2020-10-09 DIAGNOSIS — L820 Inflamed seborrheic keratosis: Secondary | ICD-10-CM | POA: Diagnosis not present

## 2020-10-09 DIAGNOSIS — Z1283 Encounter for screening for malignant neoplasm of skin: Secondary | ICD-10-CM | POA: Diagnosis not present

## 2020-10-09 DIAGNOSIS — L578 Other skin changes due to chronic exposure to nonionizing radiation: Secondary | ICD-10-CM

## 2020-10-09 DIAGNOSIS — D229 Melanocytic nevi, unspecified: Secondary | ICD-10-CM | POA: Diagnosis not present

## 2020-11-04 ENCOUNTER — Encounter: Payer: Self-pay | Admitting: Physician Assistant

## 2020-11-04 NOTE — Progress Notes (Signed)
   New Patient   Subjective  [name] is a 80 y.o. female who presents for the following: Annual Exam (New patient here today for full skin examination- no new concerns. Per records reviewed multiple non mole skin cancers. ).   The following portions of the chart were reviewed this encounter and updated as appropriate:      Objective  Well appearing patient in no apparent distress; mood and affect are within normal limits.  A full examination was performed including scalp, head, eyes, ears, nose, lips, neck, chest, axillae, abdomen, back, buttocks, bilateral upper extremities, bilateral lower extremities, hands, feet, fingers, toes, fingernails, and toenails. All findings within normal limits unless otherwise noted below.  Objective  Left Flank: Erythematous stuck-on, waxy papule or plaque.    Assessment & Plan  Inflamed seborrheic keratosis Left Flank  Destruction of lesion - Left Flank Complexity: simple   Destruction method: cryotherapy   Informed consent: discussed and consent obtained   Timeout:  patient name, date of birth, surgical site, and procedure verified Lesion destroyed using liquid nitrogen: Yes   Cryotherapy cycles:  5 Outcome: patient tolerated procedure well with no complications   Post-procedure details: wound care instructions given    Lentigines - Scattered tan macules - Discussed due to sun exposure - Benign, observe - Call for any changes  Seborrheic Keratoses - Stuck-on, waxy, tan-brown papules and plaques  - Discussed benign etiology and prognosis. - Observe - Call for any changes  Melanocytic Nevi - Tan-brown and/or pink-flesh-colored symmetric macules and papules - Benign appearing on exam today - Observation - Call clinic for new or changing moles - Recommend daily use of broad spectrum spf 30+ sunscreen to sun-exposed areas.   Hemangiomas - Red papules - Discussed benign nature - Observe - Call for any changes  Actinic  Damage - diffuse scaly erythematous macules with underlying dyspigmentation - Recommend daily broad spectrum sunscreen SPF 30+ to sun-exposed areas, reapply every 2 hours as needed.  - Call for new or changing lesions.  Skin cancer screening performed today.    I, Makalia Bare, PA-C, have reviewed all documentation for this visit. The documentation on 11/04/20 for the exam, diagnosis, procedures, and orders are all accurate and complete.

## 2020-11-19 ENCOUNTER — Encounter (INDEPENDENT_AMBULATORY_CARE_PROVIDER_SITE_OTHER): Payer: Medicare Other | Admitting: Ophthalmology

## 2020-11-25 ENCOUNTER — Encounter (INDEPENDENT_AMBULATORY_CARE_PROVIDER_SITE_OTHER): Payer: Medicare Other | Admitting: Ophthalmology

## 2020-11-25 ENCOUNTER — Other Ambulatory Visit: Payer: Self-pay

## 2020-11-25 DIAGNOSIS — H353132 Nonexudative age-related macular degeneration, bilateral, intermediate dry stage: Secondary | ICD-10-CM

## 2020-11-25 DIAGNOSIS — H34831 Tributary (branch) retinal vein occlusion, right eye, with macular edema: Secondary | ICD-10-CM

## 2020-11-25 DIAGNOSIS — I10 Essential (primary) hypertension: Secondary | ICD-10-CM

## 2020-11-25 DIAGNOSIS — H43813 Vitreous degeneration, bilateral: Secondary | ICD-10-CM

## 2020-11-25 DIAGNOSIS — H35033 Hypertensive retinopathy, bilateral: Secondary | ICD-10-CM

## 2020-12-10 ENCOUNTER — Other Ambulatory Visit: Payer: Self-pay | Admitting: Adult Health

## 2021-01-28 ENCOUNTER — Encounter (INDEPENDENT_AMBULATORY_CARE_PROVIDER_SITE_OTHER): Payer: Medicare Other | Admitting: Ophthalmology

## 2021-01-28 ENCOUNTER — Other Ambulatory Visit: Payer: Self-pay

## 2021-01-28 DIAGNOSIS — H353132 Nonexudative age-related macular degeneration, bilateral, intermediate dry stage: Secondary | ICD-10-CM | POA: Diagnosis not present

## 2021-01-28 DIAGNOSIS — H35033 Hypertensive retinopathy, bilateral: Secondary | ICD-10-CM | POA: Diagnosis not present

## 2021-01-28 DIAGNOSIS — H34831 Tributary (branch) retinal vein occlusion, right eye, with macular edema: Secondary | ICD-10-CM

## 2021-01-28 DIAGNOSIS — I10 Essential (primary) hypertension: Secondary | ICD-10-CM | POA: Diagnosis not present

## 2021-01-28 DIAGNOSIS — H43813 Vitreous degeneration, bilateral: Secondary | ICD-10-CM | POA: Diagnosis not present

## 2021-02-04 ENCOUNTER — Other Ambulatory Visit: Payer: Self-pay | Admitting: *Deleted

## 2021-02-04 DIAGNOSIS — R918 Other nonspecific abnormal finding of lung field: Secondary | ICD-10-CM

## 2021-02-10 ENCOUNTER — Ambulatory Visit: Payer: Medicare Other | Admitting: Internal Medicine

## 2021-02-17 ENCOUNTER — Ambulatory Visit: Payer: Medicare Other | Admitting: Internal Medicine

## 2021-02-17 NOTE — Progress Notes (Deleted)
Subjective:   Patient ID: [name], female    DOB: 1940/11/02  MRN: [id number]   Brief patient profile:  20   yowf quit smoking 1980 allergies itching and sneezing took shots much improved when stopped shots and stayed  much better except some problem spring and fall and did ok until 40's then developed recurrent cough typically assoc with URI's and not with spring and fall  But much worse since  Early 2014 despite rx for  Asthma and gerd so referred 05/30/2013 to pulmonary clinic by Dr Lawernce Pitts    History of Present Illness  05/30/2013 1st [facility] office visit/ Heber Hoog cc daily cough x months which is so severe gets gagged and occ vomit and pos urinary incontinent.  Clear mucus esp p shower, no better with inhalers and acid suppression to date  rec  First take delsym two tsp every 12 hours and supplement if needed with  tramadol 50 mg up to 2 every 4 hours to suppress the urge to cough at all or even clear your throat. . Once you have eliminated the cough for 3 straight days try reducing the tramadol first,  then the delsym as tolerated.   Try prilosec 20mg   Take x 2  30-60 min before first meal of the day and Pepcid 20 mg one bedtime  Prednisone 10 mg take  4 each am x 2 days,   2 each am x 2 days,  1 each am x 2 days and stop   GERD diet    10/13/2013 f/u ov/[name] re: cough x one year, worse p shower then early evening, h1 not helping  Chief Complaint  Patient presents with   Follow-up    Review CT from earlier this week.  C/o sinus congestion, PND, nonprod cough X3 weeks.      questionaire (unchanged pattern from previous ov)   Kouffman Reflux v Neurogenic Cough Differentiator Reflux Comments  Do you awaken from a sound sleep coughing violently?                            With trouble breathing?  not now   Do you have choking episodes when you cannot  Get enough air, gasping for air ?              Much better   Do you usually cough when you lie down into  The bed, or  when you just lie down to rest ?                          Recliner x 32m Due to back   Do you usually cough after meals or eating?         sometime   Do you cough when (or after) you bend over?    sometime   GERD SCORE     Kouffman Reflux v Neurogenic Cough Differentiator Neurogenic   Do you more-or-less cough all day long? Not now   Does change of temperature make you cough? yes   Does laughing or chuckling cause you to cough? yes   Do fumes (perfume, automobile fumes, burned  Toast, etc.,) cause you to cough ?      no   Does speaking, singing, or talking on the phone cause you to cough   ?               yes   Neurogenic/Airway  score    rec Depomedrol 120 mg IM  singulair 10 mg one daily in evening    11/10/2013 f/u ov/[name] re: chronic cough / MPN's Chief Complaint  Patient presents with   Follow-up    Pt states that her cough is unchanged since her last visit. No new co's today.   no noct cough at all, p stirring x 5 min then cough > dry but this is really minimal compared to previous Never produces any mucus rec See calendar for specific medication instructions    We will call you for follow up CT in first week in July.   CT chest   01/09/14 > Dominant sub solid nodule in the superior segment of the right lower lobe measures 2.2 x 1.5 cm on image 27 >  Refer to T surgery >  excisional bx 02/12/14 >pos Ca > Rllobectomy c/w Stage I Adenoca     02/22/2017 acute extended ov/[name] re: cough x 3 years/ non-adherent / no med calendar  Chief Complaint  Patient presents with   Acute Visit    Increased cough x 6 wks- started on fosamax at that time. She has also been wheezing. Cough is mainly non prod, occ will produce very minimal clear sputum. She is using proair 6 x daily on average.   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  Continue on cough control regimen        05/03/2018  f/u ov/[name] re:  Cough variant asthma/ symb 80 2bid/singulair needing surgery L knee Chief Complaint  Patient presents with   Follow-up    Breathing is overall doing well. She has been coughing some for the past 2 wks- non prod.  She has not had to use her albuterol inhaler recently.   Dyspnea:  Slowed down by L knee/ not doing steps anymore, very slow gait Cough: more x 2 weeks/ day > noct/ harsh dry early on insp assoc with sense of globus Sleeping: hob 30-40 degrees / one pillow rec Add gabapentin 100 mg three times a day as per your med calendar - take all your bedtime meds an hour before bedtime and avoid using mint based toothpaste Work on inhaler technique:     08/04/2018  f/u ov/[name] re: cough variant asthma vs uacs on symbicort 80 2 bid / gabapentin 100 tid/ confused with approp use of pill box (one slot per day but med calendar doesn't correspond to once daily dosing on many meds, one of which, ppi, is 30 min ac  Chief Complaint  Patient presents with   Follow-up    Breathing  is doing well. She only has occ cough, mainly in the am's with clear sputum. She has not had to use her proair.   Dyspnea:  Limited by L knee/ ok at HT Cough: min/ assoc with hoarse   Sleeping: hob 30 degrees/ no pillows SABA use: none 02: none  Rec No change  rx  10/08/20 NP recs Continue on Gabapentin 100mg  1 in am and 2 At bedtime.  Continue on Symbicort 2 puffs Twice daily  , rinse after use.  Robitussin DM As needed  Cough  Continue on Prilosec daily before meal  GERD diet.  Continue on Chlortrimeton 4mg  At bedtime   Remain off Singulair for now .      02/17/2021  f/u ov/[name] re: cough variant asthma vs uacs No chief complaint on file.   Dyspnea:  *** Cough: *** Sleeping: *** SABA use: *** 02: *** Covid status:   ***   No obvious day to day or daytime variability or assoc excess/ purulent sputum or mucus plugs or hemoptysis or cp or chest tightness, subjective wheeze or overt sinus or hb symptoms.   *** without nocturnal  or early am exacerbation  of respiratory  c/o's or need for noct saba. Also denies any obvious fluctuation of symptoms with weather or environmental changes or other aggravating or alleviating factors except as outlined above   No unusual exposure hx or h/o childhood pna/ asthma or knowledge of premature birth.  Current Allergies, Complete Past Medical History, Past Surgical History, Family History, and Social History were reviewed in [employer] record.  ROS  The following are not active complaints unless bolded Hoarseness, sore throat, dysphagia, dental problems, itching, sneezing,  nasal congestion or discharge of excess mucus or purulent secretions, ear ache,   fever, chills, sweats, unintended wt loss or wt gain, classically pleuritic or exertional cp,  orthopnea pnd or arm/hand swelling  or leg swelling, presyncope, palpitations, abdominal pain, anorexia, nausea, vomiting, diarrhea  or change in bowel habits or change in bladder habits, change in stools or change in urine, dysuria, hematuria,  rash, arthralgias, visual complaints, headache, numbness, weakness or ataxia or problems with walking or coordination,  change in mood or  memory.        No outpatient medications have been marked  as taking for the 02/17/21 encounter (Appointment) with Tanda Rockers, MD.                               Objective:   Physical Exam    Wt Readings from Last 3 Encounters:  10/08/20 180 lb (81.6 kg)  07/18/20 177 lb 12.8 oz (80.6 kg)  03/19/20 177 lb (80.3 kg)      Vital signs reviewed  02/17/2021  - Note at rest 02 sats  ***% on ***   General appearance:    ***

## 2021-03-18 DIAGNOSIS — Z8709 Personal history of other diseases of the respiratory system: Secondary | ICD-10-CM | POA: Diagnosis not present

## 2021-03-18 DIAGNOSIS — R059 Cough, unspecified: Secondary | ICD-10-CM | POA: Diagnosis not present

## 2021-03-18 DIAGNOSIS — J209 Acute bronchitis, unspecified: Secondary | ICD-10-CM | POA: Diagnosis not present

## 2021-03-19 ENCOUNTER — Other Ambulatory Visit: Payer: Self-pay | Admitting: Family Medicine

## 2021-03-19 DIAGNOSIS — Z1231 Encounter for screening mammogram for malignant neoplasm of breast: Secondary | ICD-10-CM

## 2021-03-25 ENCOUNTER — Other Ambulatory Visit: Payer: Self-pay

## 2021-03-25 ENCOUNTER — Ambulatory Visit: Payer: Medicare Other | Admitting: Physician Assistant

## 2021-03-25 ENCOUNTER — Ambulatory Visit
Admission: RE | Admit: 2021-03-25 | Discharge: 2021-03-25 | Disposition: A | Discharge status: Home / Self Care | Payer: Medicare Other | Source: Ambulatory Visit | Attending: Thoracic Surgery (Cardiothoracic Vascular Surgery) | Admitting: Thoracic Surgery (Cardiothoracic Vascular Surgery)

## 2021-03-25 VITALS — BP 117/74 | HR 76 | Resp 20 | Ht 63.0 in | Wt 175.0 lb

## 2021-03-25 DIAGNOSIS — I700 Atherosclerosis of aorta: Secondary | ICD-10-CM | POA: Diagnosis not present

## 2021-03-25 DIAGNOSIS — Z85118 Personal history of other malignant neoplasm of bronchus and lung: Secondary | ICD-10-CM | POA: Diagnosis not present

## 2021-03-25 DIAGNOSIS — R918 Other nonspecific abnormal finding of lung field: Secondary | ICD-10-CM | POA: Diagnosis not present

## 2021-03-25 NOTE — Progress Notes (Signed)
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  She had a right lower lobectomy for stage Ia adenocarcinoma in 2015.  She did not require any adjuvant therapy.  She had multiple groundglass opacities bilaterally with the largest being about 3 cm in the right upper lobe.  Those have been followed since 2015 with no significant change over time.  She is just getting over bronchitis and is on antibiotics. Otherwise, she has been doing well this past year. She is planning to go to the beach with her friend of 65 years next week.   Past Medical History:  Diagnosis Date    Multiple pulmonary nodules, largest R mid lung 06/29/2013   Followed in Pulmonary clinic/ [facility]/ [facility] - see CXR 06/27/13  - CT chest 07/14/2013 > 1. No acute findings in the thorax to account for the patient's  symptoms.  2. However, there is a subsolid nodule in the   right lower lobe that has a ground-glass attenuation component  measuring 2.5 x 1.8 cm, and a small central solid component  measuring 4 mm. Initial follow-up by chest CT without    Adenocarcinoma of lung, stage 1 ([place])    Status post right lower lobectomy August 2015   Anxiety    Asthma    Atrophic vaginitis    Chronic cough 12/20/2018   Chronic iritis of left eye    Pupil stays dilated; nonreactive   Colon polyp    Contact lens/glasses fitting    wears contacts or glasses   Cough variant asthma with component of uacs  05/31/2013   Followed in Pulmonary clinic/ [facility]/ [facility] Onset in her 60's - Spirometry 02/23/13 wnl  - 07/03/2013 Sinus CT > Mild chronic sinus  disease - No acute findings. Left-to-right nasal septal deviation of 3 mm. -med calendar 08/08/13 , redone 06/01/2016 and 02/22/2017  - Eos 4%   10/13/2013  > rec singulair daily  - FENO 05/12/2016  =   24 on singulair  - Allergy profile 05/12/2016 >   IgE  47 pos   Depression    Depression with anxiety    Dysuria 08/03/2019   Environmental allergies    Essential hypertension 02/23/2017   Changed losartan to avapro 02/22/2017 due to cough    GERD (gastroesophageal reflux disease)    Hypertriglyceridemia    Kidney stones    OA (osteoarthritis)    Osteopenia    PONV (postoperative nausea and vomiting)    S/P lobectomy of lung 02/12/2014   Status post total knee replacement, left 08/26/2018   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     Ascorbic Acid (VITAMIN C) 100 MG tablet Take 100 mg by mouth daily.  Calcium-Magnesium-Vitamin D (CALCIUM 1200+D3 PO) Take 1 tablet by mouth daily.      chlorpheniramine (CHLOR-TRIMETON) 4 MG tablet Take 4 mg by mouth at bedtime.      Cholecalciferol (VITAMIN D3 PO) Take 1 capsule by mouth daily.     dorzolamide-timolol (COSOPT) 22.3-6.8 MG/ML ophthalmic solution Place 1 drop into both eyes 2 (two) times daily.     famotidine (PEPCID) 20 MG tablet Take 20 mg by mouth at bedtime.     fenofibrate 160 MG tablet Take 160 mg by mouth daily.     FLUoxetine (PROZAC) 40 MG capsule Take 40 mg by mouth daily.      gabapentin (NEURONTIN) 100 MG capsule Take 1 capsule (100 mg total) by mouth 3 (three) times daily. One three times daily 270 capsule 3   losartan (COZAAR) 100 MG tablet Take 1 tablet (100 mg total) by mouth daily. 30 tablet 11   methocarbamol (ROBAXIN)  500 MG tablet Take 1 tablet (500 mg total) by mouth 3 (three) times daily as needed. 60 tablet 1   metoprolol tartrate (LOPRESSOR) 50 MG tablet Take 1 tablet by mouth 2 hours prior to Cardiac CT 1 tablet 0   montelukast (SINGULAIR) 10 MG tablet Take 1 tablet (10 mg total) by mouth at bedtime. 90 tablet 0   Multiple Vitamins-Minerals (CENTRUM SILVER PO) Take 1 tablet by mouth daily.     omeprazole (PRILOSEC) 40 MG capsule Take 40 mg by mouth daily.     solifenacin (VESICARE) 10 MG tablet Take 10 mg by mouth daily.     SYMBICORT 80-4.5 MCG/ACT inhaler INHALE 2 PUFFS BY MOUTH INTO THE LUNGS DAILY 10.2 each 12   traZODone (DESYREL) 50 MG tablet Take 50 mg by mouth at bedtime.     No current facility-administered medications on file prior to visit.      Physical Exam Vitals:   03/25/21 1437  BP: 117/74  Pulse: 76  Resp: 20  SpO16: 69%    80 year old woman in no acute distress Alert and oriented x3 with no focal deficits Lungs CTA bilaterally and in all fields Cardiac regular rate and rhythm, no murmur No peripheral edema  Diagnostic Tests:  CLINICAL DATA:  80 year old female with history of pulmonary nodules. Follow-up study.   EXAM: CT CHEST WITHOUT CONTRAST   TECHNIQUE: Multidetector CT imaging of the chest was performed following the standard protocol without IV contrast.   COMPARISON:  Chest CT 03/19/2020.   FINDINGS: Cardiovascular: Heart size is normal. There is no significant pericardial fluid, thickening or pericardial calcification. There is aortic atherosclerosis, as well as atherosclerosis of the great vessels of the mediastinum and the coronary arteries, including calcified atherosclerotic plaque in the left main, left anterior descending, left circumflex and right coronary arteries. Severe calcifications of the mitral annulus. Mild calcifications of the aortic valve.   Mediastinum/Nodes: No pathologically enlarged mediastinal or hilar lymph nodes. Please  note that accurate exclusion of hilar adenopathy is limited on noncontrast CT scans. Esophagus is unremarkable in appearance. No axillary lymphadenopathy.   Lungs/Pleura: Status post right lower lobectomy. Compensatory hyperexpansion of the right middle and upper lobes. Multiple small pulmonary nodules measuring 4 mm or less in size, stable compared to the prior examination, considered benign. Several patchy areas of ground-glass attenuation are also noted in the lungs bilaterally, very similar to prior examinations, largest of which is in the right upper lobe near the apex (axial image 27 of series 8) measuring 2.8 x 2.4 cm. No acute consolidative airspace  disease. No pleural effusions.   Upper Abdomen: Aortic atherosclerosis.   Musculoskeletal: There are no aggressive appearing lytic or blastic lesions noted in the visualized portions of the skeleton.   IMPRESSION: 1. Stable findings in the lungs, including multiple areas of ground-glass attenuation and multiple tiny solid pulmonary nodules all of which appear stable compared to prior examinations dating back to at least 2020, likely benign. 2. Aortic atherosclerosis, in addition to left main and 3 vessel coronary artery disease. 3. There are mild calcifications of the aortic valve and severe calcifications of the mitral annulus. Echocardiographic correlation for evaluation of potential valvular dysfunction may be warranted if clinically indicated.   Aortic Atherosclerosis (ICD10-I70.0).     Electronically Signed   By: Vinnie Langton M.D.   On: 03/25/2021 14:34   CT CHEST WITHOUT CONTRAST   TECHNIQUE: Multidetector CT imaging of the chest was performed following the standard protocol without IV contrast.   COMPARISON:  CT chest of March 01, 2020, cardiac CT of December 11, 2019   FINDINGS: Cardiovascular: Calcified atheromatous plaque of the thoracic aorta. No aneurysmal dilation. Three-vessel coronary artery disease.  Mitral annular calcification. No pericardial effusion.   Unremarkable appearance of the central pulmonary vasculature. Limited assessment of cardiovascular structures given lack of intravenous contrast.   Mediastinum/Nodes: Thoracic inlet structures are normal. No axillary lymphadenopathy. No mediastinal or hilar lymphadenopathy.   Lungs/Pleura: Areas of ground-glass in the RIGHT upper chest with no change along with numerous small pulmonary nodules the largest of these nodules measuring approximately 4 mm in the RIGHT upper lobe.   Largest area of ground-glass 3.1 x 2.8 cm.  Unchanged.   Signs of RIGHT lower lobectomy.   Scattered areas of ground-glass in the LEFT chest also with similar appearance along with small pulmonary nodules   Upper Abdomen: Incidental imaging of upper abdominal contents without acute process.   Musculoskeletal: No acute musculoskeletal process. Spinal degenerative changes. Compression fracture T12 similar to prior study.   IMPRESSION: 1. Signs of RIGHT lower lobectomy. 2. Areas of ground-glass in the RIGHT upper chest with no change along with numerous small pulmonary nodules the largest of these nodules measuring approximately 4 mm in the RIGHT upper lobe. Stability argues for benign etiology, continued attention on follow-up. 3. Three-vessel coronary artery disease. 4. Aortic atherosclerosis.   Aortic Atherosclerosis (ICD10-I70.0).     Electronically Signed   By: Zetta Bills M.D.   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  Stage Ia non-small cell carcinoma right lower lobe-7 years out from lobectomy with no evidence of recurrent disease.  Multiple pulmonary nodules  and groundglass opacities-there are bilateral solid nodules that have not changed over the past 6 years.  These are almost certainly benign.  She has multiple groundglass opacities bilaterally.  The largest is in the right upper lobe.  That is unchanged over the past year.  There is no solid component.  These need continued annual follow-up.  Plan: Return in 1 year with CT chest non contrast. She can call our office with concerns or questions before then. She also sees a pulmonologist.   I spent 30 minutes in review of records, review of images, and in consultation with [name] today.  Nicholes Rough, PA-C [facility] [phone]

## 2021-04-08 ENCOUNTER — Ambulatory Visit: Payer: Medicare Other | Admitting: Internal Medicine

## 2021-04-15 ENCOUNTER — Encounter (INDEPENDENT_AMBULATORY_CARE_PROVIDER_SITE_OTHER): Payer: Medicare Other | Admitting: Ophthalmology

## 2021-04-17 ENCOUNTER — Encounter (INDEPENDENT_AMBULATORY_CARE_PROVIDER_SITE_OTHER): Payer: Medicare Other | Admitting: Ophthalmology

## 2021-04-17 ENCOUNTER — Other Ambulatory Visit: Payer: Self-pay

## 2021-04-17 DIAGNOSIS — H43813 Vitreous degeneration, bilateral: Secondary | ICD-10-CM | POA: Diagnosis not present

## 2021-04-17 DIAGNOSIS — H353132 Nonexudative age-related macular degeneration, bilateral, intermediate dry stage: Secondary | ICD-10-CM | POA: Diagnosis not present

## 2021-04-17 DIAGNOSIS — I10 Essential (primary) hypertension: Secondary | ICD-10-CM

## 2021-04-17 DIAGNOSIS — H35033 Hypertensive retinopathy, bilateral: Secondary | ICD-10-CM | POA: Diagnosis not present

## 2021-04-17 DIAGNOSIS — H34831 Tributary (branch) retinal vein occlusion, right eye, with macular edema: Secondary | ICD-10-CM

## 2021-04-21 DIAGNOSIS — H348312 Tributary (branch) retinal vein occlusion, right eye, stable: Secondary | ICD-10-CM | POA: Diagnosis not present

## 2021-04-21 DIAGNOSIS — H401131 Primary open-angle glaucoma, bilateral, mild stage: Secondary | ICD-10-CM | POA: Diagnosis not present

## 2021-04-22 ENCOUNTER — Encounter: Payer: Self-pay | Admitting: Internal Medicine

## 2021-04-22 ENCOUNTER — Other Ambulatory Visit: Payer: Self-pay

## 2021-04-22 ENCOUNTER — Ambulatory Visit: Payer: Medicare Other | Admitting: Internal Medicine

## 2021-04-22 DIAGNOSIS — J45991 Cough variant asthma: Secondary | ICD-10-CM

## 2021-04-22 NOTE — Progress Notes (Signed)
Subjective:   Patient ID: [name], female    DOB: 09-Mar-1941  MRN: [id number]   Brief patient profile:  80yowf quit smoking 1980 allergies itching and sneezing took shots much improved when stopped shots and stayed  much better except some problem spring and fall and did ok until 40's then developed recurrent cough typically assoc with URI's and not with spring and fall  But much worse since  Early 2014 despite rx for  Asthma and gerd so referred 05/30/2013 to pulmonary clinic by Dr Lawernce Pitts    History of Present Illness  05/30/2013 1st  Pulmonary office visit/ [name] cc daily cough x months which is so severe gets gagged and occ vomit and pos urinary incontinent.  Clear mucus esp p shower, no better with inhalers and acid suppression to date  rec  First take delsym two tsp every 12 hours and supplement if needed with  tramadol 50 mg up to 2 every 4 hours to suppress the urge to cough at all or even clear your throat. . Once you have eliminated the cough for 3 straight days try reducing the tramadol first,  then the delsym as tolerated.   Try prilosec 20mg   Take x 2  30-60 min before first meal of the day and Pepcid 20 mg one bedtime  Prednisone 10 mg take  4 each am x 2 days,   2 each am x 2 days,  1 each am x 2 days and stop   GERD diet    10/13/2013 f/u ov/[name] re: cough x one year, worse p shower then early evening, h1 not helping  Chief Complaint  Patient presents with   Follow-up    Review CT from earlier this week.  C/o sinus congestion, PND, nonprod cough X3 weeks.      questionaire (unchanged pattern from previous ov)   Kouffman Reflux v Neurogenic Cough Differentiator Reflux Comments  Do you awaken from a sound sleep coughing violently?                            With trouble breathing?  not now   Do you have choking episodes when you cannot  Get enough air, gasping for air ?              Much better   Do you usually cough when you lie down into  The bed, or  when you just lie down to rest ?                          Recliner x 72m Due to back   Do you usually cough after meals or eating?         sometime   Do you cough when (or after) you bend over?    sometime   GERD SCORE     Kouffman Reflux v Neurogenic Cough Differentiator Neurogenic   Do you more-or-less cough all day long? Not now   Does change of temperature make you cough? yes   Does laughing or chuckling cause you to cough? yes   Do fumes (perfume, automobile fumes, burned  Toast, etc.,) cause you to cough ?      no   Does speaking, singing, or talking on the phone cause you to cough   ?               yes   Neurogenic/Airway score  rec Depomedrol 120 mg IM  singulair 10 mg one daily in evening    11/10/2013 f/u ov/[name] re: chronic cough / MPN's Chief Complaint  Patient presents with   Follow-up    Pt states that her cough is unchanged since her last visit. No new co's today.   no noct cough at all, p stirring x 5 min then cough > dry but this is really minimal compared to previous Never produces any mucus rec See calendar for specific medication instructions    We will call you for follow up CT in first week in July.   CT chest   01/09/14 > Dominant sub solid nodule in the superior segment of the right lower lobe measures 2.2 x 1.5 cm on image 27 >  Refer to T surgery >  excisional bx 02/12/14 >pos Ca > RLLobectomy c/w Stage I Adenoca     02/22/2017 acute extended ov/[name] re: cough x 3 years/ non-adherent / no med calendar  Chief Complaint  Patient presents with   Acute Visit    Increased cough x 6 wks- started on fosamax at that time. She has also been wheezing. Cough is mainly non prod, occ will produce very minimal clear sputum. She is using proair 6 x daily on average.   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  Continue on cough control regimen        05/03/2018  f/u ov/[name] re:  Cough variant asthma/ symb 80 2bid/singulair needing surgery L knee Chief Complaint  Patient presents with   Follow-up    Breathing is overall doing well. She has been coughing some for the past 2 wks- non prod.  She has not had to use her albuterol inhaler recently.   Dyspnea:  Slowed down by L knee/ not doing steps anymore, very slow gait Cough: more x 2 weeks/ day > noct/ harsh dry early on insp assoc with sense of globus Sleeping: hob 30-40 degrees / one pillow rec Add gabapentin 100 mg three times a day as per your med calendar - take all your bedtime meds an hour before bedtime and avoid using mint based toothpaste Work on inhaler technique:     08/04/2018  f/u ov/[name] re: cough variant asthma vs uacs on symbicort 80 2 bid / gabapentin 100 tid/ confused with approp use of pill box (one slot per day but med calendar doesn't correspond to once daily dosing on many meds, one of which, ppi, is 30 min ac  Chief Complaint  Patient presents with   Follow-up    Breathing is doing well. She  only has occ cough, mainly in the am's with clear sputum. She has not had to use her proair.   Dyspnea:  Limited by L knee/ ok at HT Cough: min/ assoc with hoarse   Sleeping: hob 30 degrees/ no pillows SABA use: none 02: none  Rec Increase  your losartan up to 100 mg daily  Symbicort 80 x 2 pffs each am then immediately brush tongue teeth and gargle See Tammy NP w/in 2 months  with  Your new pillbox and all your medications,   04/22/2021  f/u ov/Jenyfer Trawick re: cough variant asthma  maint on symbicort  80/ singulair   Chief Complaint  Patient presents with   Follow-up    Sob during exertion, bronchitis 1 month ago   Dyspnea:  flat surface cane slow pace does ok  Cough: better Sleeping: bed is 30 degrees / no noct reso cc  SABA use: rarely  02: none  Covid status: vax x 4    No obvious day to day or daytime variability or assoc excess/ purulent sputum or mucus plugs or hemoptysis or cp or chest tightness, subjective wheeze or overt sinus or hb symptoms.   Sleeping  without nocturnal  or early am exacerbation  of respiratory  c/o's or need for noct saba. Also denies any obvious fluctuation of symptoms with weather or environmental changes or other aggravating or alleviating factors except as outlined above   No unusual exposure hx or h/o childhood pna/ asthma or knowledge of premature birth.  Current Allergies, Complete Past Medical History, Past Surgical History, Family History, and Social History were reviewed in [employer] record.  ROS  The following are not active complaints unless bolded Hoarseness, sore throat, dysphagia, dental problems, itching, sneezing,  nasal congestion or discharge of excess mucus or purulent secretions, ear ache,   fever, chills, sweats, unintended wt loss or wt gain, classically pleuritic or exertional cp,  orthopnea pnd or arm/hand swelling  or leg swelling, presyncope, palpitations, abdominal pain, anorexia, nausea, vomiting, diarrhea  or change in bowel habits or change in bladder habits, change in stools or change in urine, dysuria, hematuria,  rash, arthralgias, visual complaints, headache, numbness, weakness or ataxia or problems with walking or coordination,  change in mood  or  memory.        Current Meds  Medication Sig   albuterol (VENTOLIN HFA) 108 (90 Base) MCG/ACT inhaler TAKE 2 PUFFS BY MOUTH EVERY 6 HOURS AS NEEDED FOR WHEEZE OR SHORTNESS OF BREATH   amLODipine (NORVASC) 5 MG tablet Take 5 mg by mouth daily after breakfast.   Ascorbic Acid (VITAMIN C) 100 MG tablet Take 100 mg by mouth daily.   Calcium-Magnesium-Vitamin D (CALCIUM 1200+D3 PO) Take 1 tablet by mouth daily.    chlorpheniramine (CHLOR-TRIMETON) 4 MG tablet Take 4 mg by mouth at bedtime.    Cholecalciferol (VITAMIN D3 PO) Take 1 capsule by mouth daily.   dorzolamide-timolol (COSOPT) 22.3-6.8 MG/ML ophthalmic solution Place 1 drop into both eyes 2 (two) times daily.   famotidine (PEPCID) 20 MG tablet Take 20 mg by mouth at bedtime.   fenofibrate 160 MG tablet Take 160 mg by mouth daily.   FLUoxetine (PROZAC) 40 MG capsule Take 40 mg by mouth daily.    gabapentin (NEURONTIN) 100 MG capsule Take 1 capsule (100 mg total) by mouth 3 (three) times daily. One three times daily   losartan (COZAAR) 100 MG tablet Take 1 tablet (100 mg total) by mouth daily.   methocarbamol (  ROBAXIN) 500 MG tablet Take 1 tablet (500 mg total) by mouth 3 (three) times daily as needed.   metoprolol tartrate (LOPRESSOR) 50 MG tablet Take 1 tablet by mouth 2 hours prior to Cardiac CT   montelukast (SINGULAIR) 10 MG tablet Take 1 tablet (10 mg total) by mouth at bedtime.   Multiple Vitamins-Minerals (CENTRUM SILVER PO) Take 1 tablet by mouth daily.   omeprazole (PRILOSEC) 40 MG capsule Take 40 mg by mouth daily.   solifenacin (VESICARE) 10 MG tablet Take 10 mg by mouth daily.   SYMBICORT 80-4.5 MCG/ACT inhaler INHALE 2 PUFFS BY MOUTH INTO THE LUNGS DAILY   traZODone (DESYREL) 50 MG tablet Take 50 mg by mouth at bedtime.            Objective:   Physical Exam     07/25/2013 197>>196 08/08/2013 > 09/05/2013 198 > 10/13/2013  205 > 11/10/2013  208 > 05/12/2016  214   > 02/23/2017 205 > 07/13/2017  198 >  10/26/2017   200 >  05/03/2018  201 > 08/04/2018   202        Wt Readings from Last 3 Encounters:  04/22/21 177 lb 6.4 oz (80.5 kg)  03/25/21 175 lb (79.4 kg)  10/08/20 180 lb (81.6 kg)      Vital signs reviewed  04/22/2021  - Note at rest 02 sats  98% on RA   General appearance:    amb pleasant wf nad   HEENT : pt wearing mask not removed for exam due to covid -19 concerns.    NECK :  without JVD/Nodes/TM/ nl carotid upstrokes bilaterally   LUNGS: no acc muscle use,  Nl contour chest which is clear to A and P bilaterally without cough on insp or exp maneuvers   CV:  RRR  no s3 or murmur or increase in P2, and no edema   ABD:  soft and nontender with nl inspiratory excursion in the supine position. No bruits or organomegaly appreciated, bowel sounds nl  MS:  Slow gait/ ext warm without deformities, calf tenderness, cyanosis or clubbing No obvious joint restrictions   SKIN: warm and dry without lesions    NEURO:  alert, approp, nl sensorium with  no motor or cerebellar deficits apparent.          I personally reviewed images and agree with radiology impression as follows:   Chest CT w/o contrast 03/25/21  Stable findings in the lungs, including multiple areas of ground-glass attenuation and multiple tiny solid pulmonary nodules all of which appear stable compared to prior examinations dating back to at least 2020, likely benign.

## 2021-04-22 NOTE — Patient Instructions (Signed)
No change in medications    Follow up in 6 months with T Parrett  NP  - call in meantime if needed

## 2021-04-23 ENCOUNTER — Ambulatory Visit
Admission: RE | Admit: 2021-04-23 | Discharge: 2021-04-23 | Disposition: A | Discharge status: Home / Self Care | Payer: Medicare Other | Source: Ambulatory Visit | Attending: Family Medicine | Admitting: Family Medicine

## 2021-04-23 ENCOUNTER — Encounter: Payer: Self-pay | Admitting: Internal Medicine

## 2021-04-23 DIAGNOSIS — Z1231 Encounter for screening mammogram for malignant neoplasm of breast: Secondary | ICD-10-CM | POA: Diagnosis not present

## 2021-04-23 NOTE — Assessment & Plan Note (Addendum)
Onset in her 80's - Spirometry 02/23/13 wnl  - 07/03/2013 Sinus CT > Mild chronic sinus disease - No acute findings. Left-to-right nasal septal deviation of 3 mm. -med calendar 08/08/13 , redone 06/01/2016 and 02/22/2017  - Eos 4%   10/13/2013  > rec singulair daily  - FENO 05/12/2016  =   24 on singulair  - Allergy profile 05/12/2016 >   IgE  47 pos RAST ragweed /oak/ birch  - 02/22/2017 added flutter valve and d/c'd fosfamax  - PFT's  07/13/2017  FEV1 1.29 (66 % ) ratio 69  p 13 % improvement from saba p nothing prior to study with DLCO  70/73 % corrects to 103 % for alv volume  And ERV 42% - 05/18/17 added symb 80 2bid  - 05/03/2018     75% but caused severe coughing fits  - 05/03/2018 added gabapentin 100 tid trial of uacs component - 08/04/2018  After extensive coaching inhaler device,  effectiveness =    75% (short ti) > try symb 80 just 2 pffs in am   All goals of chronic asthma control met including optimal function and elimination of symptoms with minimal need for rescue therapy.  Contingencies discussed in full including contacting this office immediately if not controlling the symptoms using the rule of two's.      >>> f/u q 6 m, sooner if needed          Each maintenance medication was reviewed in detail including emphasizing most importantly the difference between maintenance and prns and under what circumstances the prns are to be triggered using an action plan format where appropriate.  Total time for H and P, chart review, counseling, reviewing hfa device(s) and generating customized AVS unique to this office visit / same day charting = 35mn

## 2021-05-08 ENCOUNTER — Other Ambulatory Visit: Payer: Self-pay | Admitting: *Deleted

## 2021-05-08 MED ORDER — BUDESONIDE-FORMOTEROL FUMARATE 80-4.5 MCG/ACT IN AERO: INHALATION_SPRAY | RESPIRATORY_TRACT | 3 refills | Status: DC

## 2021-06-06 DIAGNOSIS — I10 Essential (primary) hypertension: Secondary | ICD-10-CM | POA: Diagnosis not present

## 2021-06-06 DIAGNOSIS — R7301 Impaired fasting glucose: Secondary | ICD-10-CM | POA: Diagnosis not present

## 2021-06-10 DIAGNOSIS — Z9181 History of falling: Secondary | ICD-10-CM | POA: Diagnosis not present

## 2021-06-10 DIAGNOSIS — I10 Essential (primary) hypertension: Secondary | ICD-10-CM | POA: Diagnosis not present

## 2021-06-10 DIAGNOSIS — Z23 Encounter for immunization: Secondary | ICD-10-CM | POA: Diagnosis not present

## 2021-06-10 DIAGNOSIS — J4520 Mild intermittent asthma, uncomplicated: Secondary | ICD-10-CM | POA: Diagnosis not present

## 2021-06-10 DIAGNOSIS — K219 Gastro-esophageal reflux disease without esophagitis: Secondary | ICD-10-CM | POA: Diagnosis not present

## 2021-06-10 DIAGNOSIS — R5381 Other malaise: Secondary | ICD-10-CM | POA: Diagnosis not present

## 2021-06-10 DIAGNOSIS — B359 Dermatophytosis, unspecified: Secondary | ICD-10-CM | POA: Diagnosis not present

## 2021-06-10 DIAGNOSIS — Z85118 Personal history of other malignant neoplasm of bronchus and lung: Secondary | ICD-10-CM | POA: Diagnosis not present

## 2021-06-10 DIAGNOSIS — E7800 Pure hypercholesterolemia, unspecified: Secondary | ICD-10-CM | POA: Diagnosis not present

## 2021-06-10 DIAGNOSIS — N1832 Chronic kidney disease, stage 3b: Secondary | ICD-10-CM | POA: Diagnosis not present

## 2021-06-17 DIAGNOSIS — M6281 Muscle weakness (generalized): Secondary | ICD-10-CM | POA: Diagnosis not present

## 2021-06-17 DIAGNOSIS — R262 Difficulty in walking, not elsewhere classified: Secondary | ICD-10-CM | POA: Diagnosis not present

## 2021-06-24 DIAGNOSIS — R262 Difficulty in walking, not elsewhere classified: Secondary | ICD-10-CM | POA: Diagnosis not present

## 2021-06-24 DIAGNOSIS — M6281 Muscle weakness (generalized): Secondary | ICD-10-CM | POA: Diagnosis not present

## 2021-06-25 ENCOUNTER — Other Ambulatory Visit: Payer: Self-pay

## 2021-06-25 ENCOUNTER — Encounter (INDEPENDENT_AMBULATORY_CARE_PROVIDER_SITE_OTHER): Payer: Medicare Other | Admitting: Ophthalmology

## 2021-06-25 DIAGNOSIS — H43813 Vitreous degeneration, bilateral: Secondary | ICD-10-CM

## 2021-06-25 DIAGNOSIS — H35033 Hypertensive retinopathy, bilateral: Secondary | ICD-10-CM | POA: Diagnosis not present

## 2021-06-25 DIAGNOSIS — H34831 Tributary (branch) retinal vein occlusion, right eye, with macular edema: Secondary | ICD-10-CM | POA: Diagnosis not present

## 2021-06-25 DIAGNOSIS — I10 Essential (primary) hypertension: Secondary | ICD-10-CM | POA: Diagnosis not present

## 2021-06-25 DIAGNOSIS — H353132 Nonexudative age-related macular degeneration, bilateral, intermediate dry stage: Secondary | ICD-10-CM

## 2021-07-30 DIAGNOSIS — R262 Difficulty in walking, not elsewhere classified: Secondary | ICD-10-CM | POA: Diagnosis not present

## 2021-07-30 DIAGNOSIS — M6281 Muscle weakness (generalized): Secondary | ICD-10-CM | POA: Diagnosis not present

## 2021-08-01 DIAGNOSIS — M6281 Muscle weakness (generalized): Secondary | ICD-10-CM | POA: Diagnosis not present

## 2021-08-01 DIAGNOSIS — R262 Difficulty in walking, not elsewhere classified: Secondary | ICD-10-CM | POA: Diagnosis not present

## 2021-08-05 DIAGNOSIS — R262 Difficulty in walking, not elsewhere classified: Secondary | ICD-10-CM | POA: Diagnosis not present

## 2021-08-05 DIAGNOSIS — M6281 Muscle weakness (generalized): Secondary | ICD-10-CM | POA: Diagnosis not present

## 2021-08-07 DIAGNOSIS — M6281 Muscle weakness (generalized): Secondary | ICD-10-CM | POA: Diagnosis not present

## 2021-08-07 DIAGNOSIS — R262 Difficulty in walking, not elsewhere classified: Secondary | ICD-10-CM | POA: Diagnosis not present

## 2021-08-11 DIAGNOSIS — R262 Difficulty in walking, not elsewhere classified: Secondary | ICD-10-CM | POA: Diagnosis not present

## 2021-08-11 DIAGNOSIS — M6281 Muscle weakness (generalized): Secondary | ICD-10-CM | POA: Diagnosis not present

## 2021-08-15 DIAGNOSIS — H903 Sensorineural hearing loss, bilateral: Secondary | ICD-10-CM | POA: Diagnosis not present

## 2021-08-18 DIAGNOSIS — M6281 Muscle weakness (generalized): Secondary | ICD-10-CM | POA: Diagnosis not present

## 2021-08-18 DIAGNOSIS — R262 Difficulty in walking, not elsewhere classified: Secondary | ICD-10-CM | POA: Diagnosis not present

## 2021-08-20 DIAGNOSIS — M6281 Muscle weakness (generalized): Secondary | ICD-10-CM | POA: Diagnosis not present

## 2021-08-20 DIAGNOSIS — R262 Difficulty in walking, not elsewhere classified: Secondary | ICD-10-CM | POA: Diagnosis not present

## 2021-08-21 DIAGNOSIS — E785 Hyperlipidemia, unspecified: Secondary | ICD-10-CM | POA: Diagnosis not present

## 2021-08-21 DIAGNOSIS — N1832 Chronic kidney disease, stage 3b: Secondary | ICD-10-CM | POA: Diagnosis not present

## 2021-08-21 DIAGNOSIS — N390 Urinary tract infection, site not specified: Secondary | ICD-10-CM | POA: Diagnosis not present

## 2021-08-21 DIAGNOSIS — Z87442 Personal history of urinary calculi: Secondary | ICD-10-CM | POA: Diagnosis not present

## 2021-08-21 DIAGNOSIS — Z85118 Personal history of other malignant neoplasm of bronchus and lung: Secondary | ICD-10-CM | POA: Diagnosis not present

## 2021-08-21 DIAGNOSIS — J45909 Unspecified asthma, uncomplicated: Secondary | ICD-10-CM | POA: Diagnosis not present

## 2021-08-21 DIAGNOSIS — I129 Hypertensive chronic kidney disease with stage 1 through stage 4 chronic kidney disease, or unspecified chronic kidney disease: Secondary | ICD-10-CM | POA: Diagnosis not present

## 2021-08-21 DIAGNOSIS — N189 Chronic kidney disease, unspecified: Secondary | ICD-10-CM | POA: Diagnosis not present

## 2021-08-22 ENCOUNTER — Other Ambulatory Visit: Payer: Self-pay | Admitting: Internal Medicine

## 2021-08-22 DIAGNOSIS — N1832 Chronic kidney disease, stage 3b: Secondary | ICD-10-CM

## 2021-08-22 DIAGNOSIS — I129 Hypertensive chronic kidney disease with stage 1 through stage 4 chronic kidney disease, or unspecified chronic kidney disease: Secondary | ICD-10-CM

## 2021-08-22 DIAGNOSIS — Z87442 Personal history of urinary calculi: Secondary | ICD-10-CM

## 2021-08-22 DIAGNOSIS — T162XXA Foreign body in left ear, initial encounter: Secondary | ICD-10-CM | POA: Diagnosis not present

## 2021-08-22 DIAGNOSIS — E785 Hyperlipidemia, unspecified: Secondary | ICD-10-CM

## 2021-08-25 DIAGNOSIS — R262 Difficulty in walking, not elsewhere classified: Secondary | ICD-10-CM | POA: Diagnosis not present

## 2021-08-25 DIAGNOSIS — M6281 Muscle weakness (generalized): Secondary | ICD-10-CM | POA: Diagnosis not present

## 2021-08-27 DIAGNOSIS — R262 Difficulty in walking, not elsewhere classified: Secondary | ICD-10-CM | POA: Diagnosis not present

## 2021-08-27 DIAGNOSIS — M6281 Muscle weakness (generalized): Secondary | ICD-10-CM | POA: Diagnosis not present

## 2021-09-03 ENCOUNTER — Other Ambulatory Visit: Payer: Medicare Other

## 2021-09-04 ENCOUNTER — Other Ambulatory Visit: Payer: Self-pay | Admitting: Adult Health

## 2021-09-04 ENCOUNTER — Ambulatory Visit
Admission: RE | Admit: 2021-09-04 | Discharge: 2021-09-04 | Disposition: A | Discharge status: Home / Self Care | Payer: Medicare Other | Source: Ambulatory Visit | Attending: Internal Medicine | Admitting: Internal Medicine

## 2021-09-04 DIAGNOSIS — Z87442 Personal history of urinary calculi: Secondary | ICD-10-CM

## 2021-09-04 DIAGNOSIS — E785 Hyperlipidemia, unspecified: Secondary | ICD-10-CM

## 2021-09-04 DIAGNOSIS — N1832 Chronic kidney disease, stage 3b: Secondary | ICD-10-CM

## 2021-09-04 DIAGNOSIS — I129 Hypertensive chronic kidney disease with stage 1 through stage 4 chronic kidney disease, or unspecified chronic kidney disease: Secondary | ICD-10-CM

## 2021-09-04 DIAGNOSIS — N281 Cyst of kidney, acquired: Secondary | ICD-10-CM | POA: Diagnosis not present

## 2021-09-09 ENCOUNTER — Telehealth: Payer: Self-pay | Admitting: Internal Medicine

## 2021-09-09 MED ORDER — GABAPENTIN 100 MG PO CAPS: 100.0000 mg | ORAL_CAPSULE | Freq: Three times a day (TID) | ORAL | 1 refills | Status: DC

## 2021-09-09 NOTE — Telephone Encounter (Signed)
I called the patient to let her know that a refill was sent to the pharmacy for her. Nothing further needed.  ?

## 2021-09-10 ENCOUNTER — Other Ambulatory Visit: Payer: Self-pay

## 2021-09-10 ENCOUNTER — Encounter (INDEPENDENT_AMBULATORY_CARE_PROVIDER_SITE_OTHER): Payer: Medicare Other | Admitting: Ophthalmology

## 2021-09-10 DIAGNOSIS — I10 Essential (primary) hypertension: Secondary | ICD-10-CM

## 2021-09-10 DIAGNOSIS — H353132 Nonexudative age-related macular degeneration, bilateral, intermediate dry stage: Secondary | ICD-10-CM

## 2021-09-10 DIAGNOSIS — H2703 Aphakia, bilateral: Secondary | ICD-10-CM | POA: Diagnosis not present

## 2021-09-10 DIAGNOSIS — H43813 Vitreous degeneration, bilateral: Secondary | ICD-10-CM | POA: Diagnosis not present

## 2021-09-10 DIAGNOSIS — H34831 Tributary (branch) retinal vein occlusion, right eye, with macular edema: Secondary | ICD-10-CM | POA: Diagnosis not present

## 2021-09-10 DIAGNOSIS — H35033 Hypertensive retinopathy, bilateral: Secondary | ICD-10-CM | POA: Diagnosis not present

## 2021-09-21 ENCOUNTER — Inpatient Hospital Stay (HOSPITAL_COMMUNITY)
Admission: EM | Admit: 2021-09-21 | Discharge: 2021-09-24 | DRG: 065 | Disposition: A | Discharge status: Rehabilitation Facility | Payer: Medicare Other | Attending: Internal Medicine | Admitting: Internal Medicine

## 2021-09-21 ENCOUNTER — Other Ambulatory Visit: Payer: Self-pay

## 2021-09-21 ENCOUNTER — Encounter (HOSPITAL_COMMUNITY): Payer: Self-pay | Admitting: Internal Medicine

## 2021-09-21 ENCOUNTER — Observation Stay (HOSPITAL_COMMUNITY): Payer: Medicare Other

## 2021-09-21 ENCOUNTER — Emergency Department (HOSPITAL_COMMUNITY): Payer: Medicare Other

## 2021-09-21 DIAGNOSIS — R4701 Aphasia: Secondary | ICD-10-CM | POA: Diagnosis not present

## 2021-09-21 DIAGNOSIS — Z902 Acquired absence of lung [part of]: Secondary | ICD-10-CM

## 2021-09-21 DIAGNOSIS — I129 Hypertensive chronic kidney disease with stage 1 through stage 4 chronic kidney disease, or unspecified chronic kidney disease: Secondary | ICD-10-CM | POA: Diagnosis present

## 2021-09-21 DIAGNOSIS — R29703 NIHSS score 3: Secondary | ICD-10-CM | POA: Diagnosis present

## 2021-09-21 DIAGNOSIS — I6602 Occlusion and stenosis of left middle cerebral artery: Secondary | ICD-10-CM | POA: Diagnosis not present

## 2021-09-21 DIAGNOSIS — N3281 Overactive bladder: Secondary | ICD-10-CM | POA: Diagnosis not present

## 2021-09-21 DIAGNOSIS — Z825 Family history of asthma and other chronic lower respiratory diseases: Secondary | ICD-10-CM | POA: Diagnosis not present

## 2021-09-21 DIAGNOSIS — N1832 Chronic kidney disease, stage 3b: Secondary | ICD-10-CM | POA: Diagnosis present

## 2021-09-21 DIAGNOSIS — I63412 Cerebral infarction due to embolism of left middle cerebral artery: Principal | ICD-10-CM | POA: Diagnosis present

## 2021-09-21 DIAGNOSIS — Z7951 Long term (current) use of inhaled steroids: Secondary | ICD-10-CM

## 2021-09-21 DIAGNOSIS — G8311 Monoplegia of lower limb affecting right dominant side: Secondary | ICD-10-CM | POA: Diagnosis not present

## 2021-09-21 DIAGNOSIS — M1990 Unspecified osteoarthritis, unspecified site: Secondary | ICD-10-CM | POA: Diagnosis not present

## 2021-09-21 DIAGNOSIS — Z79899 Other long term (current) drug therapy: Secondary | ICD-10-CM | POA: Diagnosis not present

## 2021-09-21 DIAGNOSIS — Z823 Family history of stroke: Secondary | ICD-10-CM

## 2021-09-21 DIAGNOSIS — M8580 Other specified disorders of bone density and structure, unspecified site: Secondary | ICD-10-CM | POA: Diagnosis not present

## 2021-09-21 DIAGNOSIS — E559 Vitamin D deficiency, unspecified: Secondary | ICD-10-CM | POA: Diagnosis not present

## 2021-09-21 DIAGNOSIS — Z20822 Contact with and (suspected) exposure to covid-19: Secondary | ICD-10-CM | POA: Diagnosis present

## 2021-09-21 DIAGNOSIS — J45909 Unspecified asthma, uncomplicated: Secondary | ICD-10-CM | POA: Diagnosis present

## 2021-09-21 DIAGNOSIS — I10 Essential (primary) hypertension: Secondary | ICD-10-CM | POA: Diagnosis present

## 2021-09-21 DIAGNOSIS — F32A Depression, unspecified: Secondary | ICD-10-CM | POA: Diagnosis present

## 2021-09-21 DIAGNOSIS — I672 Cerebral atherosclerosis: Secondary | ICD-10-CM | POA: Diagnosis not present

## 2021-09-21 DIAGNOSIS — I951 Orthostatic hypotension: Secondary | ICD-10-CM | POA: Diagnosis not present

## 2021-09-21 DIAGNOSIS — S0003XA Contusion of scalp, initial encounter: Secondary | ICD-10-CM | POA: Diagnosis present

## 2021-09-21 DIAGNOSIS — F418 Other specified anxiety disorders: Secondary | ICD-10-CM | POA: Diagnosis not present

## 2021-09-21 DIAGNOSIS — Z85118 Personal history of other malignant neoplasm of bronchus and lung: Secondary | ICD-10-CM | POA: Diagnosis not present

## 2021-09-21 DIAGNOSIS — Z043 Encounter for examination and observation following other accident: Secondary | ICD-10-CM | POA: Diagnosis not present

## 2021-09-21 DIAGNOSIS — Z801 Family history of malignant neoplasm of trachea, bronchus and lung: Secondary | ICD-10-CM | POA: Diagnosis not present

## 2021-09-21 DIAGNOSIS — I63312 Cerebral infarction due to thrombosis of left middle cerebral artery: Secondary | ICD-10-CM | POA: Diagnosis not present

## 2021-09-21 DIAGNOSIS — M25571 Pain in right ankle and joints of right foot: Secondary | ICD-10-CM | POA: Diagnosis not present

## 2021-09-21 DIAGNOSIS — E785 Hyperlipidemia, unspecified: Secondary | ICD-10-CM | POA: Diagnosis present

## 2021-09-21 DIAGNOSIS — Z803 Family history of malignant neoplasm of breast: Secondary | ICD-10-CM | POA: Diagnosis not present

## 2021-09-21 DIAGNOSIS — W19XXXA Unspecified fall, initial encounter: Secondary | ICD-10-CM | POA: Diagnosis not present

## 2021-09-21 DIAGNOSIS — I6389 Other cerebral infarction: Secondary | ICD-10-CM | POA: Diagnosis not present

## 2021-09-21 DIAGNOSIS — I69393 Ataxia following cerebral infarction: Secondary | ICD-10-CM | POA: Diagnosis not present

## 2021-09-21 DIAGNOSIS — N390 Urinary tract infection, site not specified: Secondary | ICD-10-CM

## 2021-09-21 DIAGNOSIS — J45991 Cough variant asthma: Secondary | ICD-10-CM | POA: Diagnosis not present

## 2021-09-21 DIAGNOSIS — Z886 Allergy status to analgesic agent status: Secondary | ICD-10-CM | POA: Diagnosis not present

## 2021-09-21 DIAGNOSIS — Z7982 Long term (current) use of aspirin: Secondary | ICD-10-CM | POA: Diagnosis not present

## 2021-09-21 DIAGNOSIS — I69351 Hemiplegia and hemiparesis following cerebral infarction affecting right dominant side: Secondary | ICD-10-CM | POA: Diagnosis not present

## 2021-09-21 DIAGNOSIS — E781 Pure hyperglyceridemia: Secondary | ICD-10-CM | POA: Diagnosis not present

## 2021-09-21 DIAGNOSIS — Y92009 Unspecified place in unspecified non-institutional (private) residence as the place of occurrence of the external cause: Secondary | ICD-10-CM | POA: Diagnosis not present

## 2021-09-21 DIAGNOSIS — Z8249 Family history of ischemic heart disease and other diseases of the circulatory system: Secondary | ICD-10-CM | POA: Diagnosis not present

## 2021-09-21 DIAGNOSIS — Z9109 Other allergy status, other than to drugs and biological substances: Secondary | ICD-10-CM | POA: Diagnosis not present

## 2021-09-21 DIAGNOSIS — Z888 Allergy status to other drugs, medicaments and biological substances status: Secondary | ICD-10-CM | POA: Diagnosis not present

## 2021-09-21 DIAGNOSIS — I69320 Aphasia following cerebral infarction: Secondary | ICD-10-CM | POA: Diagnosis not present

## 2021-09-21 DIAGNOSIS — Z66 Do not resuscitate: Secondary | ICD-10-CM | POA: Diagnosis present

## 2021-09-21 DIAGNOSIS — I639 Cerebral infarction, unspecified: Secondary | ICD-10-CM | POA: Diagnosis not present

## 2021-09-21 DIAGNOSIS — F419 Anxiety disorder, unspecified: Secondary | ICD-10-CM | POA: Diagnosis present

## 2021-09-21 DIAGNOSIS — Z87891 Personal history of nicotine dependence: Secondary | ICD-10-CM | POA: Diagnosis not present

## 2021-09-21 DIAGNOSIS — I6523 Occlusion and stenosis of bilateral carotid arteries: Secondary | ICD-10-CM | POA: Diagnosis not present

## 2021-09-21 DIAGNOSIS — K219 Gastro-esophageal reflux disease without esophagitis: Secondary | ICD-10-CM | POA: Diagnosis not present

## 2021-09-21 DIAGNOSIS — Z96652 Presence of left artificial knee joint: Secondary | ICD-10-CM | POA: Diagnosis not present

## 2021-09-21 HISTORY — DX: Chronic kidney disease, stage 3b: N18.32

## 2021-09-21 HISTORY — DX: Essential (primary) hypertension: I10

## 2021-09-21 HISTORY — DX: Unspecified asthma, uncomplicated: J45.909

## 2021-09-21 HISTORY — DX: Other specified anxiety disorders: F41.8

## 2021-09-21 LAB — URINALYSIS, ROUTINE W REFLEX MICROSCOPIC
Bacteria, UA: NONE SEEN
Bilirubin Urine: NEGATIVE
Glucose, UA: NEGATIVE mg/dL
Hgb urine dipstick: NEGATIVE
Ketones, ur: NEGATIVE mg/dL
Nitrite: NEGATIVE
Protein, ur: NEGATIVE mg/dL
Specific Gravity, Urine: 1.017 (ref 1.005–1.030)
pH: 5 (ref 5.0–8.0)

## 2021-09-21 LAB — CBC WITH DIFFERENTIAL/PLATELET
Abs Immature Granulocytes: 0.01 10*3/uL (ref 0.00–0.07)
Basophils Absolute: 0.1 10*3/uL (ref 0.0–0.1)
Basophils Relative: 1 %
Eosinophils Absolute: 0.3 10*3/uL (ref 0.0–0.5)
Eosinophils Relative: 3 %
HCT: 39.2 % (ref 36.0–46.0)
Hemoglobin: 12.4 g/dL (ref 12.0–15.0)
Immature Granulocytes: 0 %
Lymphocytes Relative: 12 %
Lymphs Abs: 0.9 10*3/uL (ref 0.7–4.0)
MCH: 30.8 pg (ref 26.0–34.0)
MCHC: 31.6 g/dL (ref 30.0–36.0)
MCV: 97.5 fL (ref 80.0–100.0)
Monocytes Absolute: 0.5 10*3/uL (ref 0.1–1.0)
Monocytes Relative: 7 %
Neutro Abs: 5.9 10*3/uL (ref 1.7–7.7)
Neutrophils Relative %: 77 %
Platelets: 306 10*3/uL (ref 150–400)
RBC: 4.02 MIL/uL (ref 3.87–5.11)
RDW: 13.9 % (ref 11.5–15.5)
WBC: 7.7 10*3/uL (ref 4.0–10.5)
nRBC: 0 % (ref 0.0–0.2)

## 2021-09-21 LAB — TROPONIN I (HIGH SENSITIVITY)
Troponin I (High Sensitivity): 27 ng/L — ABNORMAL HIGH (ref <18)
Troponin I (High Sensitivity): 27 ng/L — ABNORMAL HIGH (ref <18)

## 2021-09-21 LAB — BASIC METABOLIC PANEL
Anion gap: 10 (ref 5–15)
BUN: 21 mg/dL (ref 8–23)
CO2: 24 mmol/L (ref 22–32)
Calcium: 9.6 mg/dL (ref 8.9–10.3)
Chloride: 106 mmol/L (ref 98–111)
Creatinine, Ser: 1.33 mg/dL — ABNORMAL HIGH (ref 0.44–1.00)
GFR, Estimated: 40 mL/min — ABNORMAL LOW (ref >60)
Glucose, Bld: 110 mg/dL — ABNORMAL HIGH (ref 70–99)
Potassium: 4.2 mmol/L (ref 3.5–5.1)
Sodium: 140 mmol/L (ref 135–145)

## 2021-09-21 LAB — RESP PANEL BY RT-PCR (FLU A&B, COVID) ARPGX2
Influenza A by PCR: NEGATIVE
Influenza B by PCR: NEGATIVE
SARS Coronavirus 2 by RT PCR: NEGATIVE

## 2021-09-21 MED ORDER — FAMOTIDINE 20 MG PO TABS
20.0000 mg | ORAL_TABLET | Freq: Every day | ORAL | Status: DC
Start: 2021-09-22 — End: 2021-09-24
  Administered 2021-09-22 – 2021-09-24 (×3): 20 mg via ORAL
  Filled 2021-09-21 (×3): qty 1

## 2021-09-21 MED ORDER — ACETAMINOPHEN 650 MG RE SUPP: 650.0000 mg | RECTAL | Status: DC | PRN

## 2021-09-21 MED ORDER — ENOXAPARIN SODIUM 40 MG/0.4ML IJ SOSY
40.0000 mg | PREFILLED_SYRINGE | INTRAMUSCULAR | Status: DC
  Administered 2021-09-22 – 2021-09-23 (×2): 40 mg via SUBCUTANEOUS
  Filled 2021-09-21 (×2): qty 0.4

## 2021-09-21 MED ORDER — ASPIRIN 81 MG PO CHEW
81.0000 mg | CHEWABLE_TABLET | Freq: Once | ORAL | Status: AC
  Administered 2021-09-21: 81 mg via ORAL
  Filled 2021-09-21: qty 1

## 2021-09-21 MED ORDER — MOMETASONE FURO-FORMOTEROL FUM 100-5 MCG/ACT IN AERO
2.0000 | INHALATION_SPRAY | Freq: Two times a day (BID) | RESPIRATORY_TRACT | Status: DC
  Administered 2021-09-22 – 2021-09-24 (×3): 2 via RESPIRATORY_TRACT
  Filled 2021-09-21: qty 8.8

## 2021-09-21 MED ORDER — SENNOSIDES-DOCUSATE SODIUM 8.6-50 MG PO TABS: 1.0000 | ORAL_TABLET | Freq: Every evening | ORAL | Status: DC | PRN

## 2021-09-21 MED ORDER — CLOPIDOGREL BISULFATE 75 MG PO TABS
75.0000 mg | ORAL_TABLET | Freq: Every day | ORAL | Status: DC
  Administered 2021-09-22 – 2021-09-24 (×3): 75 mg via ORAL
  Filled 2021-09-21 (×3): qty 1

## 2021-09-21 MED ORDER — ACETAMINOPHEN 160 MG/5ML PO SOLN: 650.0000 mg | ORAL | Status: DC | PRN

## 2021-09-21 MED ORDER — STROKE: EARLY STAGES OF RECOVERY BOOK
Freq: Once | Status: DC
  Filled 2021-09-21: qty 1

## 2021-09-21 MED ORDER — DORZOLAMIDE HCL-TIMOLOL MAL 2-0.5 % OP SOLN
1.0000 [drp] | Freq: Two times a day (BID) | OPHTHALMIC | Status: DC
  Administered 2021-09-23 – 2021-09-24 (×3): 1 [drp] via OPHTHALMIC
  Filled 2021-09-21: qty 10

## 2021-09-21 MED ORDER — ASPIRIN 81 MG PO CHEW
81.0000 mg | CHEWABLE_TABLET | Freq: Every day | ORAL | Status: DC
  Administered 2021-09-22 – 2021-09-24 (×3): 81 mg via ORAL
  Filled 2021-09-21 (×3): qty 1

## 2021-09-21 MED ORDER — GABAPENTIN 100 MG PO CAPS
100.0000 mg | ORAL_CAPSULE | Freq: Three times a day (TID) | ORAL | Status: DC
  Administered 2021-09-22 – 2021-09-24 (×7): 100 mg via ORAL
  Filled 2021-09-21 (×7): qty 1

## 2021-09-21 MED ORDER — FLUOXETINE HCL 20 MG PO CAPS
40.0000 mg | ORAL_CAPSULE | Freq: Every day | ORAL | Status: DC
  Administered 2021-09-22 – 2021-09-24 (×3): 40 mg via ORAL
  Filled 2021-09-21 (×3): qty 2

## 2021-09-21 MED ORDER — IOHEXOL 350 MG/ML SOLN
100.0000 mL | Freq: Once | INTRAVENOUS | Status: AC | PRN
  Administered 2021-09-21: 100 mL via INTRAVENOUS

## 2021-09-21 MED ORDER — ACETAMINOPHEN 325 MG PO TABS
650.0000 mg | ORAL_TABLET | ORAL | Status: DC | PRN
  Administered 2021-09-22: 650 mg via ORAL
  Filled 2021-09-21: qty 2

## 2021-09-21 MED ORDER — NITROFURANTOIN MONOHYD MACRO 100 MG PO CAPS
100.0000 mg | ORAL_CAPSULE | Freq: Once | ORAL | Status: AC
  Administered 2021-09-21: 100 mg via ORAL
  Filled 2021-09-21: qty 1

## 2021-09-21 MED ORDER — ALBUTEROL SULFATE (2.5 MG/3ML) 0.083% IN NEBU: 2.5000 mg | INHALATION_SOLUTION | Freq: Four times a day (QID) | RESPIRATORY_TRACT | Status: DC | PRN

## 2021-09-21 MED ORDER — CLOPIDOGREL BISULFATE 75 MG PO TABS
75.0000 mg | ORAL_TABLET | Freq: Once | ORAL | Status: AC
  Administered 2021-09-21: 75 mg via ORAL
  Filled 2021-09-21: qty 1

## 2021-09-21 NOTE — Assessment & Plan Note (Addendum)
Presenting with persistent aphasia and mild right lower extremity weakness.  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 ?-Stroke team to follow ?-CTA head/neck showed occlusion of distal left MCA M2 branch and 70% stenosis of proximal right ICA ?-MRI brain ordered and pending ?-Obtain echocardiogram ?-Keep on telemetry, continue neurochecks ?-Started on DAPT with aspirin 81 mg and Plavix 75 mg daily x21 days then aspirin alone ?-Check A1c, lipid panel ?-PT/OT/SLP eval ?-Allow permissive hypertension ?

## 2021-09-21 NOTE — ED Notes (Signed)
RN unsuccessful with lab draw. RN reached out to phlebotomy.  ?

## 2021-09-21 NOTE — Hospital Course (Signed)
[name] is a 81 y.o. 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 ?

## 2021-09-21 NOTE — Code Documentation (Addendum)
Responded to Code Stroke called at 1925 on pt already in ED for fall. Code Stroke was called for expressive aphasia, LSN-1100. CBG-110, NIH-5, CT head(done prior to code stroke activation)-no hemorrhage.CTA/CTP-occlusion of distal L MCA M2 branch, 70% stenosis of prox R ICA.  TNK not given-pt out of [place]. Pt not a candidate for IR intervention. Plan for stroke workup/admission by medical team. ?

## 2021-09-21 NOTE — ED Notes (Signed)
Patient transported to CT 

## 2021-09-21 NOTE — ED Provider Notes (Signed)
[facility] EMERGENCY DEPARTMENT Provider Note   CSN: [id number] Arrival date & time: 09/21/21  1232  An emergency department physician performed an initial assessment on this suspected stroke patient at 60.  History  Chief Complaint  Patient presents with   Fall   Code Stroke    [name] is a 81 y.o. female presenting from home with a fall, daughter having concerns about difficulty with speech.  Her daughter provides supplemental history reports that she last spoke to the patient yesterday evening at 7 PM and the patient was speaking normally.  Today she received a phone call from the patient at 47 as the patient had fallen and was having difficulty at that time getting her words out.  HPI     Home Medications Prior to Admission medications   Medication Sig Start Date End Date Taking? Authorizing Provider  amLODipine (NORVASC) 10 MG tablet Take 10 mg by mouth every morning. 09/06/21   [provider]  benzonatate (TESSALON) 100 MG capsule Take 100 mg by mouth at bedtime as needed. 07/09/21   [provider]  BESIVANCE 0.6 % SUSP Apply 1 drop to eye daily as needed. 06/25/21   [provider]  dorzolamide-timolol (COSOPT) 22.3-6.8 MG/ML ophthalmic solution Place 1 drop into both eyes daily as needed. 08/05/21   [provider]  famotidine (PEPCID) 20 MG tablet Take 20 mg by mouth daily. 08/16/21   [provider]  fenofibrate 160 MG tablet Take 160 mg by mouth daily. 08/16/21   [provider]  FLUoxetine (PROZAC) 40 MG capsule Take 40 mg by mouth daily. 08/31/21   [provider]  gabapentin (NEURONTIN) 100 MG capsule Take 100 mg by mouth 3 (three) times daily. 09/09/21   [provider]  losartan (COZAAR) 50 MG tablet Take 50 mg by mouth daily. 09/06/21   [provider]  omeprazole (PRILOSEC) 40 MG capsule Take 40 mg by mouth daily. 08/31/21   [provider]  solifenacin  (VESICARE) 10 MG tablet Take 10 mg by mouth daily. 08/31/21   [provider]  SYMBICORT 80-4.5 MCG/ACT inhaler Inhale 1 puff into the lungs daily as needed. 05/21/21   [provider]  traZODone (DESYREL) 50 MG tablet Take 50 mg by mouth at bedtime. 08/31/21   [provider]      Allergies    Patient has no allergy information on record.    Review of Systems   Review of Systems  Physical Exam Updated Vital Signs BP (!) 164/82   Pulse 76   Temp 97.7 F (36.5 C) (Oral)   Resp 15   SpO2 99%  Physical Exam Constitutional:      General: She is not in acute distress. HENT:     Head: Normocephalic and atraumatic.  Eyes:     Conjunctiva/sclera: Conjunctivae normal.     Pupils: Pupils are equal, round, and reactive to light.  Cardiovascular:     Rate and Rhythm: Normal rate and regular rhythm.  Pulmonary:     Effort: Pulmonary effort is normal. No respiratory distress.  Abdominal:     General: There is no distension.     Tenderness: There is no abdominal tenderness.  Skin:    General: Skin is warm and dry.  Neurological:     Mental Status: She is alert. Mental status is at baseline.     Comments: Slow speech, some expressive aphasia Equal strength bilateral upper and lower extremities Initially did not demonstrate  right-sided neglect but subsequently did develop signs of neglect on repeat exam  Psychiatric:        Mood and Affect: Mood normal.        Behavior: Behavior normal.    ED Results / Procedures / Treatments   Labs (all labs ordered are listed, but only abnormal results are displayed) Labs Reviewed  BASIC METABOLIC PANEL - Abnormal; Notable for the following components:      Result Value   Glucose, Bld 110 (*)    Creatinine, Ser 1.33 (*)    GFR, Estimated 40 (*)    All other components within normal limits  URINALYSIS, ROUTINE W REFLEX MICROSCOPIC - Abnormal; Notable for the following components:   Leukocytes,Ua MODERATE (*)    All  other components within normal limits  TROPONIN I (HIGH SENSITIVITY) - Abnormal; Notable for the following components:   Troponin I (High Sensitivity) 27 (*)    All other components within normal limits  TROPONIN I (HIGH SENSITIVITY) - Abnormal; Notable for the following components:   Troponin I (High Sensitivity) 27 (*)    All other components within normal limits  RESP PANEL BY RT-PCR (FLU A&B, COVID) ARPGX2  URINE CULTURE  CBC WITH DIFFERENTIAL/PLATELET    EKG EKG Interpretation  Date/Time:  Sunday September 21 2021 12:57:03 EDT Ventricular Rate:  73 PR Interval:  158 QRS Duration: 101 QT Interval:  411 QTC Calculation: 453 R Axis:   68 Text Interpretation: Sinus rhythm Confirmed by Alvester Chou [phone]) on 09/21/2021 1:40:42 PM  Radiology CT HEAD WO CONTRAST ( )  Result Date: 09/21/2021 CLINICAL DATA:  Fall. EXAM: CT HEAD WITHOUT CONTRAST CT CERVICAL SPINE WITHOUT CONTRAST TECHNIQUE: Multidetector CT imaging of the head and cervical spine was performed following the standard protocol without intravenous contrast. Multiplanar CT image reconstructions of the cervical spine were also generated. RADIATION DOSE REDUCTION: This exam was performed according to the departmental dose-optimization program which includes automated exposure control, adjustment of the mA and/or kV according to patient size and/or use of iterative reconstruction technique. COMPARISON:  CT head and cervical spine dated April 17, 2015. FINDINGS: CT HEAD FINDINGS Brain: No evidence of acute infarction, hemorrhage, hydrocephalus, extra-axial collection or mass lesion/mass effect. Chronic lacunar infarcts in the left caudate head, new since 2016. Stable mild atrophy. Vascular: Atherosclerotic vascular calcification of the carotid siphons. No hyperdense vessel. Skull: Normal. Negative for fracture or focal lesion. Sinuses/Orbits: No acute finding. Other: Small right frontoparietal scalp hematoma. CT CERVICAL SPINE  FINDINGS Alignment: No traumatic malalignment. Straightening of the normal cervical lordosis. Skull base and vertebrae: No acute fracture. No primary bone lesion or focal pathologic process. Soft tissues and spinal canal: No prevertebral fluid or swelling. No visible canal hematoma. Disc levels: Multilevel disc height loss and uncovertebral hypertrophy, severe at C5-C6 and C6-C7, progressed since 2016. Progressive moderate facet arthropathy bilaterally at C3-C4. Upper chest: No acute abnormality. Tiny pulmonary nodules and ground-glass densities in both lung apices are unchanged dating back to 2020, benign. Other: None. IMPRESSION: 1. No acute intracranial abnormality. Small right frontoparietal scalp hematoma. 2. No acute cervical spine fracture or subluxation. 3. Chronic lacunar infarcts in the left caudate head, new since 2016. Electronically Signed   By: Obie Dredge M.D.   On: 09/21/2021 14:01   CT Cervical Spine Wo Contrast  Result Date: 09/21/2021 CLINICAL DATA:  Fall. EXAM: CT HEAD WITHOUT CONTRAST CT CERVICAL SPINE WITHOUT CONTRAST TECHNIQUE: Multidetector CT imaging of the head and cervical spine was performed following the standard protocol  without intravenous contrast. Multiplanar CT image reconstructions of the cervical spine were also generated. RADIATION DOSE REDUCTION: This exam was performed according to the departmental dose-optimization program which includes automated exposure control, adjustment of the mA and/or kV according to patient size and/or use of iterative reconstruction technique. COMPARISON:  CT head and cervical spine dated April 17, 2015. FINDINGS: CT HEAD FINDINGS Brain: No evidence of acute infarction, hemorrhage, hydrocephalus, extra-axial collection or mass lesion/mass effect. Chronic lacunar infarcts in the left caudate head, new since 2016. Stable mild atrophy. Vascular: Atherosclerotic vascular calcification of the carotid siphons. No hyperdense vessel. Skull: Normal.  Negative for fracture or focal lesion. Sinuses/Orbits: No acute finding. Other: Small right frontoparietal scalp hematoma. CT CERVICAL SPINE FINDINGS Alignment: No traumatic malalignment. Straightening of the normal cervical lordosis. Skull base and vertebrae: No acute fracture. No primary bone lesion or focal pathologic process. Soft tissues and spinal canal: No prevertebral fluid or swelling. No visible canal hematoma. Disc levels: Multilevel disc height loss and uncovertebral hypertrophy, severe at C5-C6 and C6-C7, progressed since 2016. Progressive moderate facet arthropathy bilaterally at C3-C4. Upper chest: No acute abnormality. Tiny pulmonary nodules and ground-glass densities in both lung apices are unchanged dating back to 2020, benign. Other: None. IMPRESSION: 1. No acute intracranial abnormality. Small right frontoparietal scalp hematoma. 2. No acute cervical spine fracture or subluxation. 3. Chronic lacunar infarcts in the left caudate head, new since 2016. Electronically Signed   By: Obie Dredge M.D.   On: 09/21/2021 14:01   CT ANGIO HEAD NECK W WO CM W PERF (CODE STROKE)  Result Date: 09/21/2021 CLINICAL DATA:  Acute neurologic deficit EXAM: CT ANGIOGRAPHY HEAD AND NECK TECHNIQUE: Multidetector CT imaging of the head and neck was performed using the standard protocol during bolus administration of intravenous contrast. Multiplanar CT image reconstructions and MIPs were obtained to evaluate the vascular anatomy. Carotid stenosis measurements (when applicable) are obtained utilizing NASCET criteria, using the distal internal carotid diameter as the denominator. RADIATION DOSE REDUCTION: This exam was performed according to the departmental dose-optimization program which includes automated exposure control, adjustment of the mA and/or kV according to patient size and/or use of iterative reconstruction technique. CONTRAST:  OMNIPAQUE IOHEXOL 350 MG/ML SOLN COMPARISON:  None. FINDINGS: CTA  NECK FINDINGS SKELETON: There is no bony spinal canal stenosis. No lytic or blastic lesion. OTHER NECK: Normal pharynx, larynx and major salivary glands. No cervical lymphadenopathy. Unremarkable thyroid gland. UPPER CHEST: No pneumothorax or pleural effusion. No nodules or masses. AORTIC ARCH: There is no calcific atherosclerosis of the aortic arch. There is no aneurysm, dissection or hemodynamically significant stenosis of the visualized portion of the aorta. Conventional 3 vessel aortic branching pattern. The visualized proximal subclavian arteries are widely patent. RIGHT CAROTID SYSTEM: No dissection, occlusion or aneurysm. There is predominantly calcified atherosclerosis extending into the proximal ICA, resulting in 70% stenosis. LEFT CAROTID SYSTEM: No dissection, occlusion or aneurysm. Mild atherosclerotic calcification at the carotid bifurcation without hemodynamically significant stenosis. VERTEBRAL ARTERIES: Left dominant configuration. Both origins are clearly patent. There is no dissection, occlusion or flow-limiting stenosis to the skull base (V1-V3 segments). CTA HEAD FINDINGS POSTERIOR CIRCULATION: --Vertebral arteries: Normal V4 segments. --Inferior cerebellar arteries: Normal. --Basilar artery: Normal. --Superior cerebellar arteries: Normal. --Posterior cerebral arteries (PCA): Normal. ANTERIOR CIRCULATION: --Intracranial internal carotid arteries: Normal. --Anterior cerebral arteries (ACA): Normal. Both A1 segments are present. Patent anterior communicating artery (a-comm). --Middle cerebral arteries (MCA): There is occlusion of a distal M2 branch on the left, at the posterior  sylvian fissure (series 8, image 86). No proximal occlusion. VENOUS SINUSES: As permitted by contrast timing, patent. ANATOMIC VARIANTS: None Review of the MIP images confirms the above findings. IMPRESSION: 1. Occlusion of a distal left MCA M2 branch, at the posterior Sylvian fissure. This corresponds to the location of the  previously identified infarct. 2. 70% stenosis of the proximal right ICA secondary to predominantly calcified atherosclerosis. 3. At the time of dictation there is a processing error with the perfusion scan. If this can be corrected, I will provide an addendum with the results. Electronically Signed   By: Deatra Robinson M.D.   On: 09/21/2021 20:26    Procedures Procedures    Medications Ordered in ED Medications  nitrofurantoin (macrocrystal-monohydrate) (MACROBID) capsule 100 mg (100 mg Oral Given 09/21/21 1628)  iohexol (OMNIPAQUE) 350 MG/ML injection 100 mL (100 mLs Intravenous Contrast Given 09/21/21 1934)  aspirin chewable tablet 81 mg (81 mg Oral Given 09/21/21 2017)  clopidogrel (PLAVIX) tablet 75 mg (75 mg Oral Given 09/21/21 2017)    ED Course/ Medical Decision Making/ A&P Clinical Course as of 09/21/21 2118  Sun Sep 21, 2021  1851 On my reassessment the patient continues to have some speech difficulty, she does not have any objective weakness of the arms or legs to suggest active large vessel occlusion [MT]  1912 Paged neurology [MT]    Clinical Course User Index [MT] Terald Sleeper, MD                           Medical Decision Making Amount and/or Complexity of Data Reviewed Labs: ordered. Radiology: ordered. ECG/medicine tests: ordered.  Risk OTC drugs. Prescription drug management. Decision regarding hospitalization.   Patient arrives from home with concern for some expressive aphasia.  There was report of right-sided neglect the paramedics upon arrival the patient does not demonstrate right-sided neglect right-sided weakness on my exam.  She does have some difficulty speaking, it is unclear if this is truly aphasia or concussion, as she has visible bruising on her head.  She has no other gross neurological deficits.  CT scan of the head obtained showing no acute infarct.  Subsequent work-up showed flat and low troponins, negative COVID, UA with possibility of  infection with positive leukocytes, BMP at baseline.  On my reassessment the patient's daughter is now present to provide supplemental history, and confirms that the patient speech is off.  She also confirmed the timeline of last known well speaking to the patient at 7 PM yesterday evening.  The patient cannot recall what caused her fall today at 11 AM, which may be related to a concussion from her head injury.  However given her persistent aphasia I do still have continued concern for possible stroke.  I have ordered MRI brain and CTA head and neck and will discuss with neurology.  Her EKG per my interpretation shows normal sinus rhythm without acute ischemic finding  *  Patient activated as a code stroke in conjunction with discussion with neurologist for concern for possible LVO, given that she did demonstrate new neurological deficits on my repeat exam including right-sided pronator drift and neglect and continued to have expressive aphasia.  The onset of her symptoms is not clear from the timeline presented, whether it would have been last night or this morning, as the patient lives alone.  Subsequent CTA of the head and neck did not show any large occlusion amenable to interventional techniques, did note a  distal left M2 occlusion is likely the source of her symptoms, and the patient was recommended for medical admission by the neurologist, to be started on aspirin and Plavix, for general stroke work-up.  Both the patient and her daughter at the bedside were updated regarding her work-up and these findings.  The patient was signed out to the hospitalist in stable condition.        Final Clinical Impression(s) / ED Diagnoses Final diagnoses:  Cerebrovascular accident (CVA), unspecified mechanism (HCC)  Urinary tract infection without hematuria, site unspecified    Rx / DC Orders ED Discharge Orders     None         Terald Sleeper, MD 09/21/21 2119

## 2021-09-21 NOTE — ED Notes (Signed)
Code stroke activated ?

## 2021-09-21 NOTE — H&P (Signed)
?History and Physical  ? ? ?[name] MPN:[id number] DOB: 31-Aug-1940 DOA: 09/21/2021 ?Patient has duplicate chart marked for merge under MRN [id number] ? ?PCP: Pcp, No  ?Patient coming from: Home via EMS ? ?I have personally briefly reviewed patient's old medical records in [place] ? ?Chief Complaint: Aphasia ? ?HPI: ?[name] is a 81 y.o. 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  History limited from patient due to expressive aphasia and is otherwise supplemented by EDP, chart review, and patient's daughter at bedside. ? ?Patient was last known well 09/20/2021 at 7 PM at which time she was speaking normally with her daughter.  Morning of 3/19, around 10 AM patient had a fall at home.  She states that she lost balance and hit her head resulting in a contusion near her right temple.  She thinks she did lose consciousness briefly.  She was able to call her daughter around 24 AM and her daughter noted that she was having difficulty speaking.  Patient also noted some weakness in her right lower extremity as well as pain near her right lower leg/ankle.  She denies any numbness/tingling.  Patient was subsequently brought to the ED for further evaluation and management. ? ?Patient otherwise denies any chest pain, palpitations, dyspnea, nausea, vomiting, abdominal pain, dysuria, diarrhea. ? ?ED Course  Labs/Imaging on admission: I have personally reviewed following labs and imaging studies. ? ?Initial vitals showed BP 158/87, pulse 72, RR 12, temp 97.7 ?F, SPO2 100% on room air. ? ?Labs show WBC 7.7, hemoglobin 12.4, platelets 306,000, sodium 140, potassium 4.2, bicarb 24, BUN 21, creatinine 1.33 (baseline 1.1-1.2), serum glucose 110, troponin 27x2. ? ?SARS-CoV-2 and influenza PCR negative.  Urinalysis negative for UTI. ? ?CT  head without contrast showed small right frontoparietal scalp hematoma, chronic lacunar infarcts in the left caudate head which were new since 2016, no acute intracranial abnormality per radiology read. ? ?CT cervical spine without contrast negative for acute cervical spine fracture or subluxation. ? ?Neurology were consulted and recommended starting on DAPT and admission for CVA work-up.  Neurology reviewed CT head and noted hypodensity posterior left MCA territory suggestive of acute infarct.  CTA head and neck obtained which showed occlusion of distal left MCA M2 branch at posterior sylvian fissure, 70% stenosis of proximal right ICA.  CT perfusion study pending due to processing error. ? ?Patient was given aspirin 81 mg, Plavix and 5 mg, oral Macrobid.  The hospitalist service was consulted to admit for further evaluation and management. ? ?Review of Systems: All systems reviewed and are negative except as documented in history of present illness above. ? ? ?Past Medical History:  ?Diagnosis Date  ? Asthma   ? Chronic kidney disease, stage 3b ([place])   ? Depression with anxiety   ? Hypertension   ? ? ?Past Surgical History:  ?Procedure Laterality Date  ? VIDEO ASSISTED THORACOSCOPY (VATS)/WEDGE RESECTION Right 02/12/2014  ? ? ?Social History: ? reports that she quit smoking about 43 years ago. Her smoking use included cigarettes. She has never used smokeless tobacco. No history on file for alcohol use and drug use. ? ?Not on File ? ?Family History  ?Problem Relation Age of Onset  ? Stroke Father   ? ? ? ?Prior to Admission medications   ?Not on File  ? ? ?Physical Exam: ?Vitals:  ?  09/21/21 1855 09/21/21 1900 09/21/21 2032 09/21/21 2115  ?BP: (!) 173/79 102/88 (!) 164/82 (!) 172/80  ?Pulse: 77 82 76 75  ?Resp: 17 20 15 16   ?Temp:      ?TempSrc:      ?SpO2: 98% 100% 99% 100%  ? ?Constitutional: Resting in bed, NAD, calm, comfortable ?Eyes: Right pupil round and reactive to light, left pupil permanently dilated per  patient, lids and conjunctivae normal ?ENMT: Mucous membranes are moist. Posterior pharynx clear of any exudate or lesions.Normal dentition.  ?Neck: normal, supple, no masses. ?Respiratory: clear to auscultation bilaterally, no wheezing, no crackles. Normal respiratory effort. No accessory muscle use.  ?Cardiovascular: Regular rate and rhythm, no murmurs / rubs / gallops. No extremity edema. 2+ pedal pulses. ?Abdomen: no tenderness, no masses palpated. No hepatosplenomegaly. ?Musculoskeletal: Pain with dorsiflexion of right ankle.  No clubbing / cyanosis. No joint deformity upper and lower extremities. Good ROM, no contractures. Normal muscle tone.  ?Skin: Contusion over right temple.  No rashes, lesions, ulcers. No induration ?Neurologic: Continued expressive aphasia and chronic permanently dilated left pupil, otherwise CN 2-12 grossly intact. Sensation intact. Strength 4/5 RLE and 5/5 in both upper extremities and LLE. ?Psychiatric: Alert and oriented x 3. Normal mood.  ? ?EKG: Personally reviewed. Normal sinus rhythm without acute ischemic changes. ? ?Assessment/Plan ?Principal Problem: ?  Aphasia due to acute cerebrovascular accident (CVA) ([place]) ?Active Problems: ?  Hypertension ?  Chronic kidney disease, stage 3b ([place]) ?  Depression with anxiety ?  Asthma ?  Fall at home, initial encounter ?  Right ankle pain ?  ?[name] is a 81 y.o. 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 ? ?Assessment and Plan: ?* Aphasia due to acute cerebrovascular accident (CVA) ([place]) ?Presenting with persistent aphasia and mild right lower extremity weakness.  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 ?-Stroke team to follow ?-CTA head/neck showed occlusion of distal left MCA M2 branch and 70% stenosis of proximal right  ICA ?-MRI brain ordered and pending ?-Obtain echocardiogram ?-Keep on telemetry, continue neurochecks ?-Started on DAPT with aspirin 81 mg and Plavix 75 mg daily x21 days then aspirin alone ?-Check A1c, lipid panel ?-PT/OT/SLP eval ?-Allow permissive hypertension ? ?Hypertension ?Holding antihypertensives to allow permissive hypertension. ? ?Chronic kidney disease, stage 3b ([place]) ?Creatinine 1.33, slightly above previous baseline 1.1-1.2.  Continue to monitor. ? ?Fall at home, initial encounter ?Right ankle pain ?Fall at home presumed secondary to acute CVA.  Has contusion near right temple after hitting her head.  CT head and neck negative for intracranial bleed or fracture.  Also has new right ankle/foot pain.  Most likely mild sprain.  Will obtain x-ray to ensure no fracture. ? ?Asthma ?Stable without active wheezing.  Continue Dulera and albuterol as needed. ? ?Depression with anxiety ?Continue home dose Prozac. ? ?DVT prophylaxis: enoxaparin (LOVENOX) injection 40 mg Start: 09/22/21 2000 ?Code Status: DNR, confirmed on admission. ?Family Communication: Discussed with patient's daughter at bedside. ?Disposition Plan: From home, dispo pending clinical progress. ?Consults called: Neurology ?Severity of Illness: ?The appropriate patient status for this patient is OBSERVATION. Observation status is judged to be reasonable and necessary in order to provide the required intensity of service to ensure the patient's safety. The patient's presenting symptoms, physical exam findings, and initial radiographic and laboratory data in the context of their medical  condition is felt to place them at decreased risk for further clinical deterioration. Furthermore, it is anticipated that the patient will be medically stable for discharge from the hospital within 2 midnights of admission.   ?Zada Finders MD ?[facility] ? ?If 7PM-7AM, please contact night-coverage ?[email or web address] ? ?09/21/2021, 10:10 PM  ?

## 2021-09-21 NOTE — Assessment & Plan Note (Signed)
Continue home dose Prozac. ?

## 2021-09-21 NOTE — Consult Note (Addendum)
NEUROLOGY CONSULTATION NOTE  ? ?Date of service: September 21, 2021 ?Patient Name: [name] ?MRN:  [id number] ?DOB:  05-31-1941 ?Reason for consult: "Expressive aphasia" ?Requesting Provider: Lenore Cordia, MD ?_ _ _   _ __   _ __ _ _  __ __   _ __   __ _ ? ?History of Present Illness  ?[name] is a 81 y.o. female with PMH significant for HTN, asthma, CKD 3b, lung ca s/p R lower lobe lobectomy who presents with expressive aphasia. She had a fall this AM around 1100 and was noted to be aphasic after that and weak on the right. Weakness improved with some improvement in aphasia but still has persistent expressive aphasia. ? ?CTH was read as no acute intracranial abnormalities and a remote L caudate stroke but on my review, does seem to show hypodensity in the posterior L MCA territory concerning for an acute stroke. ? ?Case was discussed with me and a stroke code was activated. CTA demonstrates occlusion of a distal left MCA M2 branch at the posterior sylvian fissure. The vessel is too distal and smaller than 51mm and not amenable to intervention. It matches with the area of L MCA hypodensity noted on CTH. ? ?LKW: 1100 ?mRS: 0 ?tNKASE: not offered, outside window at the time of arrival ?Thrombectomy: not offered. CT Angio does demonstrate occlusion of a distal left MCA M2 branch at the posterior sylvian fissure. The vessel is too distal and smaller than 77mm and not amenable to intervention. It matches with the area of L MCA hypodensity noted on CTH. ?NIHSS components Score: Comment  ?1a Level of Conscious 0[x]  1[]  2[]  3[]      ?1b LOC Questions 0[]  1[]  2[x]       ?1c LOC Commands 0[x]  1[]  2[]       ?2 Best Gaze 0[x]  1[]  2[]       ?3 Visual 0[]  1[x]  2[]  3[]      ?4 Facial Palsy 0[x]  1[]  2[]  3[]      ?5a Motor Arm - left 0[x]  1[]  2[]  3[]  4[]  UN[]    ?5b Motor Arm - Right 0[x]  1[]  2[]  3[]  4[]  UN[]    ?6a Motor Leg - Left 0[x]  1[]  2[]  3[]  4[]  UN[]    ?6b Motor Leg - Right 0[x]  1[]  2[]  3[]  4[]  UN[]    ?7 Limb Ataxia  0[x]  1[]  2[]  3[]  UN[]     ?8 Sensory 0[x]  1[]  2[]  UN[]      ?9 Best Language 0[]  1[]  2[x]  3[]      ?10 Dysarthria 0[x]  1[]  2[]  UN[]      ?11 Extinct. and Inattention 0[x]  1[]  2[]       ?TOTAL:    ? ?  ?ROS  ? ?Unable to obtain detailed ROS 2/2 aphasia. ? ?Past History  ? ?Past Medical History:  ?Diagnosis Date  ? Asthma   ? Chronic kidney disease, stage 3b ([place])   ? Depression with anxiety   ? Hypertension   ? ?Past Surgical History:  ?Procedure Laterality Date  ? VIDEO ASSISTED THORACOSCOPY (VATS)/WEDGE RESECTION Right 02/12/2014  ? ?Family History  ?Problem Relation Age of Onset  ? Stroke Father   ? ?Social History  ? ?Socioeconomic History  ? Marital status: Widowed  ?  Spouse name: Not on file  ? Number of children: Not on file  ? Years of education: Not on file  ? Highest education level: Not on file  ?Occupational History  ? Not on file  ?Tobacco Use  ? Smoking status: Former  ?  Types: Cigarettes  ?  Quit date: 15  ?  Years since quitting: 43.2  ? Smokeless tobacco: Never  ?Substance and Sexual Activity  ? Alcohol use: Not on file  ? Drug use: Not on file  ? Sexual activity: Not on file  ?Other Topics Concern  ? Not on file  ?Social History Narrative  ? Not on file  ? ?Social Determinants of Health  ? ?Financial Resource Strain: Not on file  ?Food Insecurity: Not on file  ?Transportation Needs: Not on file  ?Physical Activity: Not on file  ?Stress: Not on file  ?Social Connections: Not on file  ? ?Not on File ? ?Medications  ?(Not in a hospital admission) ?  ? ?Vitals  ? ?Vitals:  ? 09/21/21 1812 09/21/21 1855 09/21/21 1900 09/21/21 2032  ?BP: (!) 163/95 (!) 173/79 102/88 (!) 164/82  ?Pulse: 69 77 82 76  ?Resp: 17 17 20 15   ?Temp:      ?TempSrc:      ?SpO2: 100% 98% 100% 99%  ?  ? ?There is no height or weight on file to calculate BMI. ? ?Physical Exam  ? ?General: Laying comfortably in bed; in no acute distress.  ?HENT: Normal oropharynx and mucosa. Normal external appearance of ears and nose.  ?Neck: Supple,  no pain or tenderness  ?CV: No JVD. No peripheral edema.  ?Pulmonary: Symmetric Chest rise. Normal respiratory effort.  ?Abdomen: Soft to touch, non-tender.  ?Ext: No cyanosis, edema, or deformity  ?Skin: No rash. Normal palpation of skin.   ?Musculoskeletal: Normal digits and nails by inspection. No clubbing.  ? ?Neurologic Examination  ?Mental status/Cognition: Alert, oriented to self, place, but not to month and year, good attention.  ?Speech/language: Non fluent and gets stuck on several words, comprehension intact, only able to name a couple objects out of several shown to her, repetition intact. ?Cranial nerves:  ? CN II Pupils equal and reactive to light, no VF deficits   ? CN III,IV,VI EOM intact, no gaze preference or deviation, no nystagmus   ? CN V normal sensation in V1, V2, and V3 segments bilaterally   ? CN VII no asymmetry, no nasolabial fold flattening   ? CN VIII normal hearing to speech   ? CN IX & X normal palatal elevation, no uvular deviation   ? CN XI 5/5 head turn and 5/5 shoulder shrug bilaterally  ? CN XII midline tongue protrusion   ? ?Motor:  ?Muscle bulk: normal, tone normal, pronator drift yes RUE pronator drift. tremor none ?Mvmt Root Nerve  Muscle Right Left Comments  ?SA C5/6 Ax Deltoid 5 5   ?EF C5/6 Mc Biceps 5 5   ?EE C6/7/8 Rad Triceps 5 5   ?WF C6/7 Med FCR     ?WE C7/8 PIN ECU     ?F Ab C8/T1 U ADM/FDI 5 5   ?HF L1/2/3 Fem Illopsoas 4+ 5 Pain in R Leg  ?KE L2/3/4 Fem Quad 5 5   ?DF L4/5 D Peron Tib Ant 4 5 Pain in R foot  ?PF S1/2 Tibial Grc/Sol 5 5   ? ?Reflexes: ? Right Left Comments  ?Pectoralis     ? Biceps (C5/6) 2 2   ?Brachioradialis (C5/6) 2 2   ? Triceps (C6/7) 2 2   ? Patellar (L3/4) 2 2   ? Achilles (S1)     ? Hoffman     ? Plantar     ?Jaw jerk   ? ?Sensation: ? Light touch Intact  throughout  ? Pin prick   ? Temperature   ? Vibration   ?Proprioception   ? ?Coordination/Complex Motor:  ?- Finger to Nose intact BL ?- Heel to shin unable to assess due to pain in R  leg. ?- Rapid alternating movement are normal ?- Gait: Deferred for patient safety given RLE pain. ? ?Labs  ? ?CBC:  ?Recent Labs  ?Lab 09/21/21 ?1325  ?WBC 7.7  ?NEUTROABS 5.9  ?HGB 12.4  ?HCT 39.2  ?MCV 97.5  ?PLT 306  ? ? ?Basic Metabolic Panel:  ?Lab Results  ?Component Value Date  ? NA 140 09/21/2021  ? K 4.2 09/21/2021  ? CO2 24 09/21/2021  ? GLUCOSE 110 (H) 09/21/2021  ? BUN 21 09/21/2021  ? CREATININE 1.33 (H) 09/21/2021  ? CALCIUM 9.6 09/21/2021  ? GFRNONAA 40 (L) 09/21/2021  ? ?Lipid Panel: No results found for: [place] ?HgbA1c: No results found for: HGBA1C ?Urine Drug Screen: No results found for: LABOPIA, COCAINSCRNUR, [place], [place], THCU, LABBARB  ?Alcohol Level No results found for: ETH ? ?CT Head without contrast(Personally reviewed): ?Posterior Left MCA hypodensity concerning for an acute stroke. ?Remote L caudate stroke. ? ?CT angio Head and Neck with contrast(Personally reviewed): ?Distal L MCA M2 occlusion. ? ?CT Perfusion: ?Technical issues with processing and thus not available for review. RT Techs have reached out to [email or web address] team to sort this out. ? ?MRI Brain: ?Pending. ? ?Impression  ? ?[name] is a 81 y.o. female with PMH significant for HTN, asthma, CKD 3b, lung ca s/p R lower lobe lobectomy who presents with expressive aphasia. She had a fall this AM around 1100 and was noted to be aphasic after that and weak on the right. Weakness improved with some improvement in aphasia but still has persistent expressive aphasia. ? ?Her presentation is most likely explained by a posterior L MCA territory stroke. The stroke appears embolic in nature and she will need workup to identify and treatable causes of her stroke. ? ?Primary Diagnosis:  ?Cerebral infarction due to embolism of  left middle cerebral artery.  ? ?Secondary Diagnosis: ?Essential (primary) hypertension ? ?Recommendations  ? ?- Frequent Neuro checks per stroke unit protocol ?- Recommend brain imaging with MRI Brain  without contrast ?- Recommend obtaining TTE ?- Recommend obtaining Lipid panel with LDL ?- Please start statin if LDL > 70 ?- Recommend HbA1c ?- Antithrombotic - Aspirin 81mg  daily along with plavix 75mg  daily x

## 2021-09-21 NOTE — Assessment & Plan Note (Signed)
Creatinine 1.33, slightly above previous baseline 1.1-1.2.  Continue to monitor. ?

## 2021-09-21 NOTE — ED Triage Notes (Addendum)
Pt BIB EMS due to a fall from home. Pt had right sided neglect and aphasia. Pt fell about 1000 this morning. Pts LSN was 1900 3/18. Pt has a hx of dementia. VSS. Axox4. ?

## 2021-09-21 NOTE — ED Notes (Signed)
Daughter at bedside.

## 2021-09-21 NOTE — Assessment & Plan Note (Signed)
Holding antihypertensives to allow permissive hypertension. ?

## 2021-09-21 NOTE — Assessment & Plan Note (Signed)
Stable without active wheezing.  Continue Dulera and albuterol as needed. ?

## 2021-09-21 NOTE — Assessment & Plan Note (Signed)
Right ankle pain ?Fall at home presumed secondary to acute CVA.  Has contusion near right temple after hitting her head.  CT head and neck negative for intracranial bleed or fracture.  Also has new right ankle/foot pain.  Most likely mild sprain.  Will obtain x-ray to ensure no fracture. ?

## 2021-09-21 NOTE — ED Notes (Signed)
Tried to get patient blood. I didn't have any success. ?

## 2021-09-21 NOTE — ED Notes (Signed)
Patient transported to MRI 

## 2021-09-22 ENCOUNTER — Observation Stay (HOSPITAL_BASED_OUTPATIENT_CLINIC_OR_DEPARTMENT_OTHER): Payer: Medicare Other

## 2021-09-22 ENCOUNTER — Observation Stay (HOSPITAL_COMMUNITY): Payer: Medicare Other

## 2021-09-22 DIAGNOSIS — W19XXXA Unspecified fall, initial encounter: Secondary | ICD-10-CM | POA: Diagnosis not present

## 2021-09-22 DIAGNOSIS — I639 Cerebral infarction, unspecified: Secondary | ICD-10-CM

## 2021-09-22 DIAGNOSIS — I6389 Other cerebral infarction: Secondary | ICD-10-CM

## 2021-09-22 DIAGNOSIS — I951 Orthostatic hypotension: Secondary | ICD-10-CM

## 2021-09-22 DIAGNOSIS — N390 Urinary tract infection, site not specified: Secondary | ICD-10-CM | POA: Diagnosis not present

## 2021-09-22 DIAGNOSIS — R4701 Aphasia: Secondary | ICD-10-CM | POA: Diagnosis not present

## 2021-09-22 DIAGNOSIS — Y92009 Unspecified place in unspecified non-institutional (private) residence as the place of occurrence of the external cause: Secondary | ICD-10-CM

## 2021-09-22 LAB — LIPID PANEL
Cholesterol: 151 mg/dL (ref 0–200)
HDL: 53 mg/dL (ref >40)
LDL Cholesterol: 69 mg/dL (ref 0–99)
Total CHOL/HDL Ratio: 2.8 RATIO
Triglycerides: 143 mg/dL (ref <150)
VLDL: 29 mg/dL (ref 0–40)

## 2021-09-22 LAB — ECHOCARDIOGRAM COMPLETE
AR max vel: 2.12 cm2
AV Area VTI: 1.89 cm2
AV Area mean vel: 2.01 cm2
AV Mean grad: 3 mmHg
AV Peak grad: 6 mmHg
Ao pk vel: 1.22 m/s
Area-P 1/2: 3.77 cm2
Calc EF: 63.5 %
MV VTI: 1.37 cm2
S' Lateral: 2.8 cm
Single Plane A2C EF: 54.4 %
Single Plane A4C EF: 71.6 %

## 2021-09-22 LAB — CBC
HCT: 37.1 % (ref 36.0–46.0)
Hemoglobin: 12.3 g/dL (ref 12.0–15.0)
MCH: 31.7 pg (ref 26.0–34.0)
MCHC: 33.2 g/dL (ref 30.0–36.0)
MCV: 95.6 fL (ref 80.0–100.0)
Platelets: 286 10*3/uL (ref 150–400)
RBC: 3.88 MIL/uL (ref 3.87–5.11)
RDW: 13.8 % (ref 11.5–15.5)
WBC: 7.1 10*3/uL (ref 4.0–10.5)
nRBC: 0 % (ref 0.0–0.2)

## 2021-09-22 LAB — BASIC METABOLIC PANEL
Anion gap: 8 (ref 5–15)
BUN: 15 mg/dL (ref 8–23)
CO2: 22 mmol/L (ref 22–32)
Calcium: 9.3 mg/dL (ref 8.9–10.3)
Chloride: 105 mmol/L (ref 98–111)
Creatinine, Ser: 1.2 mg/dL — ABNORMAL HIGH (ref 0.44–1.00)
GFR, Estimated: 45 mL/min — ABNORMAL LOW (ref >60)
Glucose, Bld: 91 mg/dL (ref 70–99)
Potassium: 4 mmol/L (ref 3.5–5.1)
Sodium: 135 mmol/L (ref 135–145)

## 2021-09-22 LAB — HEMOGLOBIN A1C
Hgb A1c MFr Bld: 5.2 % (ref 4.8–5.6)
Mean Plasma Glucose: 102.54 mg/dL

## 2021-09-22 MED ORDER — SODIUM CHLORIDE 0.9 % IV BOLUS
1000.0000 mL | Freq: Once | INTRAVENOUS | Status: AC
  Administered 2021-09-22: 1000 mL via INTRAVENOUS

## 2021-09-22 MED ORDER — SODIUM CHLORIDE 0.9 % IV SOLN: INTRAVENOUS | Status: AC

## 2021-09-22 MED ORDER — FENOFIBRATE 160 MG PO TABS
160.0000 mg | ORAL_TABLET | Freq: Every day | ORAL | Status: DC
  Administered 2021-09-22 – 2021-09-24 (×3): 160 mg via ORAL
  Filled 2021-09-22 (×3): qty 1

## 2021-09-22 NOTE — ED Notes (Signed)
Neuro at the bedside.

## 2021-09-22 NOTE — Progress Notes (Signed)
?PROGRESS NOTE ? ? ? ?[name]  NOM:[id number] DOB: 1941/01/03 DOA: 09/21/2021 ?PCP: Pcp, No  ? ? ?Brief Narrative:  ?[name] is a 81 y.o. 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 ? ? ? ?Consultants:  ?neurology ? ?Procedures:  ? ?Antimicrobials:  ?  ? ? ?Subjective: ?Has no complaints.  Still has trouble getting the words out.  No shortness of breath or chest pain. ? ?Objective: ?Vitals:  ? 09/22/21 0745 09/22/21 0900 09/22/21 1200 09/22/21 1545  ?BP: (!) 160/75 (!) 152/85 (!) 142/87 (!) 155/80  ?Pulse: 71 75 92 78  ?Resp: 17 14 14 12   ?Temp:      ?TempSrc:      ?SpO2: 99% 100% 100% 90%  ? ? ?Intake/Output Summary (Last 24 hours) at 09/22/2021 1553 ?Last data filed at 09/22/2021 0541 ?Gross per 24 hour  ?Intake 1000 ml  ?Output --  ?Net 1000 ml  ? ?There were no vitals filed for this visit. ? ?Examination: ? ?Calm, NAD ?Cta no w/r ?Reg s1/s2 no gallop ?Soft benign +bs ?No edema ?Aaoxox3, difficulty getting words out.  No facial droop ?Mood and affect appropriate in current setting  ? ? ? ?Data Reviewed: I have personally reviewed following labs and imaging studies ? ?CBC: ?Recent Labs  ?Lab 09/21/21 ?1325 09/22/21 ?0434  ?WBC 7.7 7.1  ?NEUTROABS 5.9  --   ?HGB 12.4 12.3  ?HCT 39.2 37.1  ?MCV 97.5 95.6  ?PLT 306 286  ? ?Basic Metabolic Panel: ?Recent Labs  ?Lab 09/21/21 ?1325 09/22/21 ?0434  ?NA 140 135  ?K 4.2 4.0  ?CL 106 105  ?CO2 24 22  ?GLUCOSE 110* 91  ?BUN 21 15  ?CREATININE 1.33* 1.20*  ?CALCIUM 9.6 9.3  ? ?GFR: ?CrCl cannot be calculated (Unknown ideal weight.). ?Liver Function Tests: ?No results for input(s): AST, ALT, ALKPHOS, BILITOT, PROT, ALBUMIN in the last 168 hours. ?No results for input(s): LIPASE, AMYLASE in the last 168 hours. ?No results for input(s): AMMONIA in the last 168 hours. ?Coagulation  Profile: ?No results for input(s): INR, PROTIME in the last 168 hours. ?Cardiac Enzymes: ?No results for input(s): CKTOTAL, CKMB, CKMBINDEX, TROPONINI in the last 168 hours. ?BNP (last 3 results) ?No results for input(s): PROBNP in the last 8760 hours. ?HbA1C: ?Recent Labs  ?  09/22/21 ?0434  ?HGBA1C 5.2  ? ?CBG: ?No results for input(s): GLUCAP in the last 168 hours. ?Lipid Profile: ?Recent Labs  ?  09/22/21 ?0434  ?CHOL 151  ?HDL 53  ?[place] 69  ?TRIG 143  ?CHOLHDL 2.8  ? ?Thyroid Function Tests: ?No results for input(s): TSH, T4TOTAL, FREET4, T3FREE, THYROIDAB in the last 72 hours. ?Anemia Panel: ?No results for input(s): VITAMINB12, FOLATE, FERRITIN, TIBC, IRON, RETICCTPCT in the last 72 hours. ?Sepsis Labs: ?No results for input(s): PROCALCITON, LATICACIDVEN in the last 168 hours. ? ?Recent Results (from the past 240 hour(s))  ?Resp Panel by RT-PCR (Flu A&B, Covid) Nasopharyngeal Swab     Status: None  ? Collection Time: 09/21/21  2:10 PM  ? Specimen: Nasopharyngeal Swab; Nasopharyngeal(NP) swabs in vial transport medium  ?Result Value Ref Range Status  ? SARS Coronavirus 2 by RT PCR NEGATIVE NEGATIVE Final  ?  Comment: (NOTE) ?SARS-CoV-2 target nucleic acids are NOT DETECTED. ? ?The SARS-CoV-2 RNA is generally detectable in upper respiratory ?specimens during the acute phase of infection.  The lowest ?concentration of SARS-CoV-2 viral copies this assay can detect is ?138 copies/mL. A negative result does not preclude SARS-Cov-2 ?infection and should not be used as the sole basis for treatment or ?other patient management decisions. A negative result may occur with  ?improper specimen collection/handling, submission of specimen other ?than nasopharyngeal swab, presence of viral mutation(s) within the ?areas targeted by this assay, and inadequate number of viral ?copies(<138 copies/mL). A negative result must be combined with ?clinical observations, patient history, and epidemiological ?information. The expected  result is Negative. ? ?Fact Sheet for Patients:  ?EntrepreneurPulse.com.au ? ?Fact Sheet for Healthcare Providers:  ?IncredibleEmployment.be ? ?This test is no t yet approved or cleared by the [place] FDA and  ?has been authorized for detection and/or diagnosis of SARS-CoV-2 by ?FDA under an Emergency Use Authorization (EUA). This EUA will remain  ?in effect (meaning this test can be used) for the duration of the ?COVID-19 declaration under Section 564(b)(1) of the Act, 21 ?U.S.C.section 360bbb-3(b)(1), unless the authorization is terminated  ?or revoked sooner.  ? ? ?  ? Influenza A by PCR NEGATIVE NEGATIVE Final  ? Influenza B by PCR NEGATIVE NEGATIVE Final  ?  Comment: (NOTE) ?The Xpert Xpress SARS-CoV-2/FLU/RSV plus assay is intended as an aid ?in the diagnosis of influenza from Nasopharyngeal swab specimens and ?should not be used as a sole basis for treatment. Nasal washings and ?aspirates are unacceptable for Xpert Xpress SARS-CoV-2/FLU/RSV ?testing. ? ?Fact Sheet for Patients: ?EntrepreneurPulse.com.au ? ?Fact Sheet for Healthcare Providers: ?IncredibleEmployment.be ? ?This test is not yet approved or cleared by the [place] FDA and ?has been authorized for detection and/or diagnosis of SARS-CoV-2 by ?FDA under an Emergency Use Authorization (EUA). This EUA will remain ?in effect (meaning this test can be used) for the duration of the ?COVID-19 declaration under Section 564(b)(1) of the Act, 21 U.S.C. ?section 360bbb-3(b)(1), unless the authorization is terminated or ?revoked. ? ?Performed at [facility], [place] [address], [place], [place] ?[address] ?  ?  ? ? ? ? ? ?Radiology Studies: ?DG Ankle Complete Right ? ?Result Date: 09/21/2021 ?CLINICAL DATA:  Fall, code stroke. EXAM: RIGHT ANKLE - COMPLETE 3+ VIEW COMPARISON:  None. FINDINGS: There is no evidence of fracture, dislocation, or joint effusion. Mild degenerative  changes at the ankle and midfoot. Small calcaneal spur. Enthesopathic changes at the insertion site of the Achilles tendon. Soft tissues are unremarkable. IMPRESSION: No acute fracture or dislocation. Electronically Signed   By: Brett Fairy M.D.   On: 09/21/2021 22:37  ? ?CT HEAD WO CONTRAST (5MM) ? ?Result Date: 09/21/2021 ?CLINICAL DATA:  Fall. EXAM: CT HEAD WITHOUT CONTRAST CT CERVICAL SPINE WITHOUT CONTRAST TECHNIQUE: Multidetector CT imaging of the head and cervical spine was performed following the standard protocol without intravenous contrast. Multiplanar CT image reconstructions of the cervical spine were also generated. RADIATION DOSE REDUCTION: This exam was performed according to the departmental dose-optimization program which includes automated exposure control, adjustment of the mA and/or kV according to patient size and/or use of iterative reconstruction technique. COMPARISON:  CT head and cervical spine dated April 17, 2015. FINDINGS: CT HEAD FINDINGS Brain: No evidence of acute infarction, hemorrhage, hydrocephalus, extra-axial collection or mass lesion/mass effect. Chronic lacunar infarcts in the left caudate head, new since 2016. Stable mild atrophy. Vascular: Atherosclerotic vascular calcification of the carotid siphons. No hyperdense vessel. Skull: Normal. Negative for fracture or focal lesion. Sinuses/Orbits: No acute finding. Other: Small right frontoparietal scalp hematoma. CT CERVICAL SPINE  FINDINGS Alignment: No traumatic malalignment. Straightening of the normal cervical lordosis. Skull base and vertebrae: No acute fracture. No primary bone lesion or focal pathologic process. Soft tissues and spinal canal: No prevertebral fluid or swelling. No visible canal hematoma. Disc levels: Multilevel disc height loss and uncovertebral hypertrophy, severe at C5-C6 and C6-C7, progressed since 2016. Progressive moderate facet arthropathy bilaterally at C3-C4. Upper chest: No acute abnormality. Tiny  pulmonary nodules and ground-glass densities in both lung apices are unchanged dating back to 2020, benign. Other: None. IMPRESSION: 1. No acute intracranial abnormality. Small right frontoparietal scalp hematoma.

## 2021-09-22 NOTE — Progress Notes (Signed)
Lower extremity venous bilateral study completed.   Please see CV Proc for preliminary results.   Maryhelen Lindler, RDMS, RVT  

## 2021-09-22 NOTE — Progress Notes (Signed)
Lower extremity venous attempted. Patient with therapy. Will attempt again as schedule and patient availability permits.  ? ?Darlin Coco, RDMS, RVT ? ?

## 2021-09-22 NOTE — Evaluation (Signed)
Physical Therapy Evaluation ?Patient Details ?Name: [name] ?MRN: [id number] ?DOB: January 25, 1941 ?Today's Date: 09/22/2021 ? ?History of Present Illness ? Pt is a 81 yo female presenting to the ED 09/21/21 for R sided weakness and aphasia. 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 PMH includes HTN, CKD, and lung CA s/p R lower lobe lobectomy.  ?Clinical Impression ? Pt admitted secondary to problem above with deficits below. Pt with difficulty sequencing, RLE weakness, and aphasia. Required mod A for bed mobility and min A+2  for transfers. Pt's BP dropped in standing, see below. Recommending AIR level therapies to increase independence and safety with mobility tasks. Will continue to follow acutely.  ? ? 09/22/21 1152  ?Orthostatic Lying   ?BP- Lying 171/82  ?Pulse- Lying 79  ?Orthostatic Sitting  ?BP- Sitting 167/86  ?Pulse- Sitting 89  ?Orthostatic Standing at 0 minutes  ?BP- Standing at 0 minutes 142/87  ?Pulse- Standing at 0 minutes 92  ? ?   ? ?Recommendations for follow up therapy are one component of a multi-disciplinary discharge planning process, led by the attending physician.  Recommendations may be updated based on patient status, additional functional criteria and insurance authorization. ? ?Follow Up Recommendations Acute inpatient rehab (3hours/day) ? ?  ?Assistance Recommended at Discharge Frequent or constant Supervision/Assistance  ?Patient can return home with the following ? A little help with walking and/or transfers;A little help with bathing/dressing/bathroom;Assistance with cooking/housework;Help with stairs or ramp for entrance;Assist for transportation ? ?  ?Equipment Recommendations Other (comment) (TBD)  ?Recommendations for Other Services ?    ?  ?Functional Status Assessment Patient has had a recent decline in their functional status and  demonstrates the ability to make significant improvements in function in a reasonable and predictable amount of time.  ? ?  ?Precautions / Restrictions Precautions ?Precautions: Fall ?Restrictions ?Weight Bearing Restrictions: No  ? ?  ? ?Mobility ? Bed Mobility ?Overal bed mobility: Needs Assistance ?Bed Mobility: Supine to Sit, Sit to Supine ?  ?  ?Supine to sit: Mod assist ?Sit to supine: Mod assist ?  ?General bed mobility comments: Required assist with trunk and LE assist. Multimodal cues for sequencing. ?  ? ?Transfers ?Overall transfer level: Needs assistance ?Equipment used: 2 person hand held assist ?Transfers: Sit to/from Stand ?Sit to Stand: Min assist, +2 physical assistance ?  ?  ?  ?  ?  ?General transfer comment: Assist for lift assist and steadying to stand. BP dropped from 167/86 and dropped to 142/87. Further mobility deferred. ?  ? ?Ambulation/Gait ?  ?  ?  ?  ?  ?  ?  ?  ? ?Stairs ?  ?  ?  ?  ?  ? ?Wheelchair Mobility ?  ? ?Modified Rankin (Stroke Patients Only) ?  ? ?  ? ?Balance Overall balance assessment: Needs assistance ?Sitting-balance support: No upper extremity supported, Feet supported ?Sitting balance-Leahy Scale: Fair ?  ?  ?Standing balance support: Bilateral upper extremity supported ?Standing balance-Leahy Scale: Poor ?Standing balance comment: reliant on BUE support ?  ?  ?  ?  ?  ?  ?  ?  ?  ?  ?  ?   ? ? ? ?Pertinent Vitals/Pain Pain Assessment ?Pain Assessment: No/denies pain  ? ? ?Home Living Family/patient expects to be discharged to:: Private residence ?Living Arrangements: Alone ?  ?Type of Home:  House ?Home Access: Stairs to enter ?Entrance Stairs-Rails: None ?Entrance Stairs-Number of Steps: 1 ?  ?Home Layout: One level ?Home Equipment: Conservation officer, nature (2 wheels);Cane - single point;Grab bars - toilet;Grab bars - tub/shower;Shower seat ?   ?  ?Prior Function Prior Level of Function : Independent/Modified Independent;Driving ?  ?  ?  ?  ?  ?  ?Mobility Comments: Uses RW when  outside of home ?  ?  ? ? ?Hand Dominance  ? Dominant Hand: Right ? ?  ?Extremity/Trunk Assessment  ? Upper Extremity Assessment ?Upper Extremity Assessment: Defer to OT evaluation ?  ? ?Lower Extremity Assessment ?Lower Extremity Assessment: RLE deficits/detail ?RLE Deficits / Details: Grossly 4/5 at knee extensors and grossly 2/5 at ankle. Notable bruising on lateral ankle/foot. Decreased coordination noted. ?  ? ?Cervical / Trunk Assessment ?Cervical / Trunk Assessment: Normal  ?Communication  ? Communication: Expressive difficulties  ?Cognition Arousal/Alertness: Awake/alert ?Behavior During Therapy: [facility] for tasks assessed/performed ?Overall Cognitive Status: Impaired/Different from baseline ?Area of Impairment: Problem solving ?  ?  ?  ?  ?  ?  ?  ?  ?  ?  ?  ?  ?  ?  ?Problem Solving: Slow processing, Difficulty sequencing, Requires verbal cues, Requires tactile cues ?General Comments: Pt with difficulty sequencing to perform coordination tasks. Notable expressive difficulty, especially when sitting up. ?  ?  ? ?  ?General Comments General comments (skin integrity, edema, etc.): Pt's daughter present during session ? ?  ?Exercises    ? ?Assessment/Plan  ?  ?PT Assessment Patient needs continued PT services  ?PT Problem List Decreased strength;Decreased balance;Decreased activity tolerance;Decreased range of motion;Decreased mobility;Decreased knowledge of use of DME;Decreased knowledge of precautions;Pain ? ?   ?  ?PT Treatment Interventions DME instruction;Functional mobility training;Stair training;Gait training;Therapeutic exercise;Therapeutic activities;Balance training;Patient/family education   ? ?PT Goals (Current goals can be found in the Care Plan section)  ?Acute Rehab PT Goals ?Patient Stated Goal: to be independent ?PT Goal Formulation: With patient ?Time For Goal Achievement: 10/06/21 ?Potential to Achieve Goals: Good ? ?  ?Frequency Min 4X/week ?  ? ? ?Co-evaluation PT/OT/SLP  Co-Evaluation/Treatment: Yes ?Reason for Co-Treatment: To address functional/ADL transfers;For patient/therapist safety ?PT goals addressed during session: Balance;Mobility/safety with mobility ?  ?  ? ? ?  ?AM-PAC PT "6 Clicks" Mobility  ?Outcome Measure Help needed turning from your back to your side while in a flat bed without using bedrails?: A Little ?Help needed moving from lying on your back to sitting on the side of a flat bed without using bedrails?: A Lot ?Help needed moving to and from a bed to a chair (including a wheelchair)?: A Lot ?Help needed standing up from a chair using your arms (e.g., wheelchair or bedside chair)?: A Lot ?Help needed to walk in hospital room?: A Lot ?Help needed climbing 3-5 steps with a railing? : Total ?6 Click Score: 12 ? ?  ?End of Session Equipment Utilized During Treatment: Gait belt ?Activity Tolerance: Patient tolerated treatment well ?Patient left: in bed;with call bell/phone within reach (on stretcher in ED) ?Nurse Communication: Mobility status ?PT Visit Diagnosis: Unsteadiness on feet (R26.81);Muscle weakness (generalized) (M62.81) ?  ? ?Time: 8416-6063 ?PT Time Calculation (min) (ACUTE ONLY): 28 min ? ? ?Charges:   PT Evaluation ?$PT Eval Moderate Complexity: 1 Mod ?  ?  ?   ? ? ?Reuel Derby, PT, DPT  ?Acute Rehabilitation Services  ?Pager: [phone] ?Office: [phone] ? ? ?[place] ?09/22/2021, 1:30 PM ? ?

## 2021-09-22 NOTE — Progress Notes (Signed)
Was notified of new R leg numbness. Will give her 1L saline bolus and do 100cc/hr fluids x 2 days and head of bed flat. ? ? ?Donnetta Simpers ?[facility] ?Pager Number [phone] ?

## 2021-09-22 NOTE — ED Notes (Signed)
Pt urinary incontinent in bed. ED staff cleaned pt, changed pt's linens and gown, and placed Purewick on pt. Warm blankets placed across her. ?

## 2021-09-22 NOTE — Progress Notes (Addendum)
Admitted [name] to 6E70. Patient is alert and oriented x4, with expressive aphasia. Daughter at bedside. Assessed skin with Cyril Mourning and Alexander Bergeron. Connected to telemetry. Oriented to room, bed controls, and call light. Bed placed in lowest position.  ?

## 2021-09-22 NOTE — Progress Notes (Signed)
PT Cancellation Note ? ?Patient Details ?Name: [name] ?MRN: [id number] ?DOB: [birth year] ? ? ?Cancelled Treatment:    Reason Eval/Treat Not Completed: Medical issues which prohibited therapy Per MD, requesting to hold until neuro MD sees patient. Will follow up as schedule allows and as medically appropriate.  ? ?Reuel Derby, PT, DPT  ?Acute Rehabilitation Services  ?Pager: [phone] ?Office: [phone] ? ? ? ?[place] ?09/22/2021, 8:48 AM ? ? ?

## 2021-09-22 NOTE — ED Notes (Signed)
Head of bed lowered completely ?

## 2021-09-22 NOTE — Evaluation (Signed)
Speech Language Pathology Evaluation ?Patient Details ?Name: TAL NEER ?MRN: [id number] ?DOB: 03-Apr-1941 ?Today's Date: 09/22/2021 ?Time: 5638-7564 ?SLP Time Calculation (min) (ACUTE ONLY): 40 min ? ?Problem List:  ?Patient Active Problem List  ? Diagnosis Date Noted  ? 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 Chronic kidney disease, stage 3b ([place]) 09/21/2021  ? Depression with anxiety 09/21/2021  ? Hypertension 09/21/2021  ? Asthma 09/21/2021  ? Fall at home, initial encounter 09/21/2021  ? Right ankle pain 09/21/2021  ? ?Past Medical History:  ?Past Medical History:  ?Diagnosis Date  ? Asthma   ? Chronic kidney disease, stage 3b ([place])   ? Depression with anxiety   ? Hypertension   ? ?Past Surgical History:  ?Past Surgical History:  ?Procedure Laterality Date  ? VIDEO ASSISTED THORACOSCOPY (VATS)/WEDGE RESECTION Right 02/12/2014  ? ?HPI:  ?81 y.o. female presented to the ED for evaluation of aphasia after fall. 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  ? ?Assessment / Plan / Recommendation ?Clinical Impression ? Pt presents with an expressive > receptive aphasia marked by impaired naming ( 0% accuracy on initial confrontation naming tasks - responded to sentence and phonemic cues; errors were paraphasic - semantic and phonemic), difficulty with left/right discrimination, + ability to follow simple commands with decreased accuracy as complexity of commands increases, + object discrimination, intact repetition of single words with inaccuracies as length of utterance increases. Pt demonstrates appropriate awareness of errors with  sometimes successful attempts to self-correct.  ? ?Her daughter, Davy Pique, was at bedside during assessment.  We discussed the nature of aphasia and ways to facilitate communication.  Pt lives alone in [place]; her daughter and son both live out of state. Ms. Blayney had good awareness of the need for f/u rehab and that she will need assistance for some period of time during her recovery.  Encouraged pt/family to talk about how they might manage supervision/support in the initial weeks post-rehab. ?   ?SLP Assessment ? SLP Recommendation/Assessment: Patient needs continued [facility] ?SLP Visit Diagnosis: Aphasia (R47.01)  ?  ?Recommendations for follow up therapy are one component of a multi-disciplinary discharge planning process, led by the attending physician.  Recommendations may be updated based on patient status, additional functional criteria and insurance authorization. ?   ?Follow Up Recommendations ? Acute inpatient rehab (3hours/day)  ?  ?Assistance Recommended at Discharge ? Intermittent Supervision/Assistance  ?Functional Status Assessment Patient has had a recent decline in their functional status and demonstrates the ability to make significant improvements in function in a reasonable and predictable amount of time.  ?Frequency and Duration min 3x week  ?2 weeks ?  ?   ?SLP Evaluation ?Cognition ? Overall Cognitive Status: Within Functional Limits for tasks assessed ?Arousal/Alertness: Awake/alert ?Orientation Level: Oriented to person;Oriented to place;Oriented to situation ?Attention: Selective ?Selective Attention: Appears intact ?Awareness: Appears intact  ?  ?   ?Comprehension ? Auditory Comprehension ?Overall Auditory Comprehension: Impaired ?Yes/No Questions: Within Functional Limits ?Commands: Impaired ?One Step Basic Commands: 75-100% accurate ?Two Step Basic Commands: 75-100% accurate ?Multistep Basic Commands: 75-100% accurate ?Conversation: Simple ?Visual  Recognition/Discrimination ?Discrimination: Within Function Limits  ?  ?Expression Expression ?Primary Mode  of Expression: Verbal ?Verbal Expression ?Overall Verbal Expression: Impaired ?Initiation: No impairment ?Automatic Speech: Name;Social Response;Counting ?Level of Generative/Spontaneous Verbalization: Sentence ?Repetition: Impaired ?Level of Impairment: Phrase level ?Naming: Impairment ?Responsive: 26-50% accurate ?Confrontation: Impaired ?Convergent: 0-24% accurate ?Divergent: 0-24% accurate ?Verbal Errors: Phonemic paraphasias;Semantic paraphasias ?Pragmatics: No impairment ?Effective Techniques: Sentence completion;Phonemic cues ?Written Expression ?Dominant Hand: Right ?Written Expression: Not tested   ?Oral / Motor ? Oral Motor/Sensory Function ?Overall Oral Motor/Sensory Function: Within functional limits ?Motor Speech ?Overall Motor Speech: Appears within functional limits for tasks assessed ?Respiration: Within functional limits ?Phonation: Normal ?Resonance: Within functional limits ?Articulation: Within functional limitis ?Intelligibility: Intelligible ?Motor Planning: Witnin functional limits   ?        ? ?[name] ?09/22/2021, 11:06 AM ? ?Levita Monical L. Aleza Pew, MA CCC/SLP ?Acute Rehabilitation Services ?Office number [phone] ?Pager [phone] ? ?

## 2021-09-22 NOTE — ED Notes (Signed)
Placed breakfast order ?

## 2021-09-22 NOTE — Evaluation (Signed)
Occupational Therapy Evaluation ?Patient Details ?Name: [name] ?MRN: [id number] ?DOB: May 14, 1941 ?Today's Date: 09/22/2021 ? ? ?History of Present Illness Pt is a 81 yo female presenting to the ED 09/21/21 for R sided weakness and aphasia. 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 PMH includes HTN, CKD, and lung CA s/p R lower lobe lobectomy.  ? ?Clinical Impression ?  ?PTA patient reports independent and driving. Admitted for above and limited by problem list below, including decreased strength and coordination in R UE, impaired balance, impaired cognition and communication.  Pt demonstrates aphasia (expressive), as well as poor sequencing, problem solving and slowed processing.  She currently requires up to max assist for LB ADLs and min assist +2 for transfers at EOB.  Limited session due to BP drop in standing.  Will follow acutely and recommend AIR at dc.   ? ?BP supine 171/82 (HR 79) ?BP sitting 167/86 (HR 89) ?BP standing 142/87 (HR 92)  ?   ? ?Recommendations for follow up therapy are one component of a multi-disciplinary discharge planning process, led by the attending physician.  Recommendations may be updated based on patient status, additional functional criteria and insurance authorization.  ? ?Follow Up Recommendations ? Acute inpatient rehab (3hours/day)  ?  ?Assistance Recommended at Discharge Frequent or constant Supervision/Assistance  ?Patient can return home with the following A lot of help with walking and/or transfers;A lot of help with bathing/dressing/bathroom;Assistance with cooking/housework;Direct supervision/assist for medications management;Direct supervision/assist for financial management;Help with stairs or ramp for entrance;Assist for transportation ? ?  ?Functional Status Assessment ? Patient has had a recent decline in their  functional status and demonstrates the ability to make significant improvements in function in a reasonable and predictable amount of time.  ?Equipment Recommendations ? Other (comment) (defer)  ?  ?Recommendations for Other Services Rehab consult ? ? ?  ?Precautions / Restrictions Precautions ?Precautions: Fall ?Restrictions ?Weight Bearing Restrictions: No  ? ?  ? ?Mobility Bed Mobility ?Overal bed mobility: Needs Assistance ?Bed Mobility: Supine to Sit, Sit to Supine ?  ?  ?Supine to sit: Mod assist ?Sit to supine: Mod assist ?  ?General bed mobility comments: Required assist with trunk and LE assist. Multimodal cues for sequencing. ?  ? ?Transfers ?  ?  ?  ?  ?  ?  ?  ?  ?  ?  ?  ? ?  ?Balance Overall balance assessment: Needs assistance ?Sitting-balance support: No upper extremity supported, Feet supported ?Sitting balance-Leahy Scale: Fair ?  ?  ?Standing balance support: Bilateral upper extremity supported ?Standing balance-Leahy Scale: Poor ?Standing balance comment: reliant on BUE support ?  ?  ?  ?  ?  ?  ?  ?  ?  ?  ?  ?   ? ?ADL either performed or assessed with clinical judgement  ? ?ADL Overall ADL's : Needs assistance/impaired ?  ?  ?Grooming: Sitting;Minimal assistance ?  ?  ?  ?  ?  ?Upper Body Dressing : Minimal assistance;Sitting ?  ?Lower Body Dressing: Maximal assistance;Sit to/from stand;+2 for physical assistance ?  ?  ?Toilet Transfer Details (indicate cue type and reason): deferred ?  ?  ?  ?  ?Functional mobility during ADLs: Minimal assistance;Maximal assistance;Cueing for safety;Cueing for sequencing ?   ? ? ? ?Vision Baseline Vision/History: 1 Wears glasses ?Vision Assessment?: No apparent visual deficits  ?   ?  Perception   ?  ?Praxis   ?  ? ?Pertinent Vitals/Pain Pain Assessment ?Pain Assessment: No/denies pain  ? ? ? ?Hand Dominance Right ?  ?Extremity/Trunk Assessment Upper Extremity Assessment ?Upper Extremity Assessment: RUE deficits/detail ?RUE Deficits / Details: grossly 3+/5 MMT,  decreased coordination ?RUE Sensation: WNL ?RUE Coordination: decreased fine motor ?  ?Lower Extremity Assessment ?Lower Extremity Assessment: Defer to PT evaluation ?RLE Deficits / Details: Grossly 4/5 at knee extensors and grossly 2/5 at ankle. Notable bruising on lateral ankle/foot. Decreased coordination noted. ?  ?Cervical / Trunk Assessment ?Cervical / Trunk Assessment: Normal ?  ?Communication Communication ?Communication: Expressive difficulties ?  ?Cognition Arousal/Alertness: Awake/alert ?Behavior During Therapy: [facility] for tasks assessed/performed ?Overall Cognitive Status: Impaired/Different from baseline ?Area of Impairment: Problem solving ?  ?  ?  ?  ?  ?  ?  ?  ?  ?  ?  ?  ?  ?  ?Problem Solving: Slow processing, Difficulty sequencing, Requires verbal cues, Requires tactile cues ?General Comments: Pt with difficulty sequencing to perform coordination tasks. Notable expressive difficulty, especially when sitting up. ?  ?  ?General Comments  daughter present ? ?  ?Exercises   ?  ?Shoulder Instructions    ? ? ?Home Living Family/patient expects to be discharged to:: Private residence ?Living Arrangements: Alone ?  ?Type of Home: House ?Home Access: Stairs to enter ?Entrance Stairs-Number of Steps: 1 ?Entrance Stairs-Rails: None ?Home Layout: One level ?  ?  ?Bathroom Shower/Tub: Walk-in shower ?  ?Bathroom Toilet: Handicapped height ?  ?  ?Home Equipment: Conservation officer, nature (2 wheels);Cane - single point;Grab bars - toilet;Grab bars - tub/shower;Shower seat ?  ?  ?  ? ?  ?Prior Functioning/Environment Prior Level of Function : Independent/Modified Independent;Driving ?  ?  ?  ?  ?  ?  ?Mobility Comments: Uses RW when outside of home ?ADLs Comments: ind ADLs, IADLs, driving ?  ? ?  ?  ?OT Problem List: Decreased strength;Decreased activity tolerance;Impaired balance (sitting and/or standing);Decreased coordination;Decreased cognition;Decreased safety awareness;Decreased knowledge of use of DME or AE;Decreased  knowledge of precautions;Impaired UE functional use ?  ?   ?OT Treatment/Interventions: Self-care/ADL training;Therapeutic exercise;DME and/or AE instruction;Therapeutic activities;Cognitive remediation/compensation;Patient/family education;Balance training  ?  ?OT Goals(Current goals can be found in the care plan section) Acute Rehab OT Goals ?Patient Stated Goal: get home ?OT Goal Formulation: With patient ?Time For Goal Achievement: 10/06/21 ?Potential to Achieve Goals: Good  ?OT Frequency: Min 2X/week ?  ? ?Co-evaluation PT/OT/SLP Co-Evaluation/Treatment: Yes ?Reason for Co-Treatment: For patient/therapist safety;To address functional/ADL transfers ?PT goals addressed during session: Balance;Mobility/safety with mobility ?OT goals addressed during session: ADL's and self-care ?  ? ?  ?AM-PAC OT "6 Clicks" Daily Activity     ?Outcome Measure Help from another person eating meals?: A Little ?Help from another person taking care of personal grooming?: A Little ?Help from another person toileting, which includes using toliet, bedpan, or urinal?: A Lot ?Help from another person bathing (including washing, rinsing, drying)?: A Lot ?Help from another person to put on and taking off regular upper body clothing?: A Little ?Help from another person to put on and taking off regular lower body clothing?: A Lot ?6 Click Score: 15 ?  ?End of Session Nurse Communication: Mobility status ? ?Activity Tolerance: Patient tolerated treatment well ?Patient left: in bed;with call bell/phone within reach;with bed alarm set ? ?OT Visit Diagnosis: Other abnormalities of gait and mobility (R26.89);Muscle weakness (generalized) (M62.81);Other symptoms and signs involving the nervous system (R29.898)  ?              ?  Time: 7209-4709 ?OT Time Calculation (min): 28 min ?Charges:  OT General Charges ?$OT Visit: 1 Visit ?OT Evaluation ?$OT Eval Moderate Complexity: 1 Mod ? ?Jolaine Artist, OT ?Acute Rehabilitation Services ?Pager  [phone] ?Office [phone] ? ? ?Delight Stare ?09/22/2021, 2:10 PM ?

## 2021-09-22 NOTE — ED Notes (Signed)
RN spoke with neurologist and made him aware of changes in modified neurological NIHSS. Per neurologist, plan to lower head of bed and infuse liter of fluid.  ?

## 2021-09-22 NOTE — ED Notes (Signed)
Neurologist informed this RN that he slightly elevated head of bed, stating it is OK to elevate head as long as the angle remains under 30 degrees.  ?

## 2021-09-22 NOTE — Progress Notes (Addendum)
STROKE TEAM PROGRESS NOTE  ? ?ATTENDING NOTE: ?I reviewed above note and agree with the assessment and plan. Pt was seen and examined.  ? ?81 year old female with history of hypertension, CKD, lung cancer status post lobectomy admitted for aphasia after fall, right-sided weakness.  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  CT head and neck showed distal left M2 occlusion, right ICA 70% stenosis, bilateral siphon and left ICA bulb atherosclerosis.  MRI showed left MCA moderate-sized infarct.  LE venous Doppler no DVT.  EF 60 to 65%.  LDL 69, A1c 5.2.  Creatinine 1.33.  UA WBC 11-20.  Orthostatic vital lying 171/82, sitting 167/86, standing 142/87, mildly positive, but asymptomatic. ? ?On exam, daughter at bedside.  Patient awake alert, partial expressive aphasia, able to repeat simple sentences but not complex sentence.  Naming 2/4.  Word finding difficulty for answer orientation questions.  Follows simple commands. No gaze palsy, tracking bilaterally, visual field full, PERRL. No facial droop. Tongue midline.  Right upper and lower extremity 4/5, left upper and lower 5/5.Marland Kitchen Sensation symmetrical bilaterally, b/l FTN intact, gait not tested.  ? ?Etiology for patient stroke likely due to intracranial stenosis given diffuse intracranial atherosclerosis.  However cardioembolic source cannot be can be ruled out.  Recommend 30-day CardioNet monitoring as outpatient to rule out A-fib.  Continue DAPT for 3 months and then aspirin alone given intracranial stenosis.  Continue fenofibrate as LDL at goal.  Mild orthostatic hypotension management per primary team.  PT/OT recommend CIR. ? ?For detailed assessment and plan, please refer to above as I have made changes wherever appropriate.  ? ?Neurology will sign off. Please call with questions. Pt will follow up with stroke clinic NP at [facility] in about 4 weeks. Thanks for the consult. ? ? ?[name], [name] ?Stroke Neurology ?09/22/2021 ?7:21  PM ? ? ? ?INTERVAL HISTORY ?Her daughter is at the bedside. Patient was comfortable, awake, alert, oriented. Speech hesitancy, with naming difficulty and repeating intact with effort.  Patient has difficulty giving history. ?09/21/2021 patient had right lower extremity weakness and resulting fall where she hit her head and ankle, and she lost consciousness briefly.  Daughter noted speech difficulty when patient called her.  Patient has chronic irregular and enlarged pupil, but has been present that she was in her 75s, was unable to bring up details currently due to speech difficulty.  ?Currently patient still has right-sided weakness in distal upper and lower extremity.  She has decreased sensation on right side of face.  Currently she denied headaches, nausea/vomiting, dizziness, double vision.  Does endorse mild tenderness on foot at the site of bruise from her fall. ?Otherwise patient and family has no other concerns. ? ? ?Vitals:  ? 09/22/21 0700 09/22/21 0745 09/22/21 0900 09/22/21 1200  ?BP: (!) 160/77 (!) 160/75 (!) 152/85 (!) 142/87  ?Pulse: 72 71 75 92  ?Resp: 20 17 14 14   ?Temp:      ?TempSrc:      ?SpO2: 99% 99% 100% 100%  ? ?CBC:  ?Recent Labs  ?Lab 09/21/21 ?1325 09/22/21 ?0434  ?WBC 7.7 7.1  ?NEUTROABS 5.9  --   ?HGB 12.4 12.3  ?HCT 39.2 37.1  ?MCV 97.5 95.6  ?PLT 306 286  ? ?Basic Metabolic Panel:  ?Recent Labs  ?Lab 09/21/21 ?1325 09/22/21 ?0434  ?NA 140 135  ?K 4.2 4.0  ?CL 106 105  ?CO2 24 22  ?GLUCOSE 110* 91  ?BUN 21 15  ?CREATININE 1.33* 1.20*  ?  CALCIUM 9.6 9.3  ? ?Lipid Panel:  ?Recent Labs  ?Lab 09/22/21 ?7564  ?CHOL 151  ?TRIG 143  ?HDL 53  ?CHOLHDL 2.8  ?VLDL 29  ?[place] 69  ? ?HgbA1c:  ?Recent Labs  ?Lab 09/22/21 ?3329  ?HGBA1C 5.2  ? ?Urine Drug Screen: No results for input(s): LABOPIA, COCAINSCRNUR, LABBENZ, AMPHETMU, THCU, LABBARB in the last 168 hours. ?Alcohol Level No results for input(s): ETH in the last 168 hours. ? ?IMAGING past 24 hours ?DG Ankle Complete Right ? ?Result Date:  09/21/2021 ?CLINICAL DATA:  Fall, code stroke. EXAM: RIGHT ANKLE - COMPLETE 3+ VIEW COMPARISON:  None. FINDINGS: There is no evidence of fracture, dislocation, or joint effusion. Mild degenerative changes at the ankle and midfoot. Small calcaneal spur. Enthesopathic changes at the insertion site of the Achilles tendon. Soft tissues are unremarkable. IMPRESSION: No acute fracture or dislocation. Electronically Signed   By: Brett Fairy M.D.   On: 09/21/2021 22:37  ? ?MR BRAIN WO CONTRAST ? ?Result Date: 09/22/2021 ?CLINICAL DATA:  Expressive aphasia EXAM: MRI HEAD WITHOUT CONTRAST TECHNIQUE: Multiplanar, multiecho pulse sequences of the brain and surrounding structures were obtained without intravenous contrast. COMPARISON:  Head CT 09/21/2021 FINDINGS: Brain: Early subacute infarct within the left MCA territory involving the left parietal and temporal lobes. No other site of acute ischemia. There are old infarcts of the left basal ganglia and both cerebellar hemispheres. No acute or chronic hemorrhage. There is multifocal hyperintense T2-weighted signal within the white matter. Parenchymal volume and CSF spaces are normal. The midline structures are normal. Vascular: Major flow voids are preserved. Skull and upper cervical spine: Normal calvarium and skull base. Visualized upper cervical spine and soft tissues are normal. Sinuses/Orbits:No paranasal sinus fluid levels or advanced mucosal thickening. No mastoid or middle ear effusion. Normal orbits. IMPRESSION: 1. Early subacute infarct of the left MCA territory involving the left parietal and temporal lobes. No hemorrhage or mass effect. 2. Old infarcts of the left basal ganglia and both cerebellar hemispheres. Electronically Signed   By: Ulyses Jarred M.D.   On: 09/22/2021 00:08  ? ?ECHOCARDIOGRAM COMPLETE ? ?Result Date: 09/22/2021 ?   ECHOCARDIOGRAM REPORT   Patient Name:   [name] Date of Exam: 09/22/2021 Medical Rec #:  [id number]          Height:        63.0 in Accession #:    [id number]         Weight:       170.0 lb Date of Birth:  [birth year]           BSA:          1.805 m? Patient Age:    83 years           BP:           160/77 mmHg Patient Gender: F                  HR:           77 bpm. Exam Location:  Inpatient Procedure: 2D Echo, Cardiac Doppler, Color Doppler and Strain Analysis Indications:    CVA  History:        Patient has no prior history of Echocardiogram examinations.                 Risk Factors:Hypertension.  Sonographer:    Luisa Hart RDCS Referring Phys: [id number] Cleaster Corin PATEL  Sonographer Comments: Technically difficult study due to poor echo windows. IMPRESSIONS  1. Left ventricular ejection fraction, by estimation, is 60 to 65%. The left ventricle has normal function. The left ventricle has no regional wall motion abnormalities. Left ventricular diastolic parameters are consistent with Grade I diastolic dysfunction (impaired relaxation). Elevated left ventricular end-diastolic pressure.  2. Right ventricular systolic function is normal. The right ventricular size is normal.  3. The mitral valve is normal in structure. No evidence of mitral valve regurgitation. No evidence of mitral stenosis. Moderate mitral annular calcification.  4. The aortic valve was not well visualized. There is mild calcification of the aortic valve. There is mild thickening of the aortic valve. Aortic valve regurgitation is not visualized. Aortic valve sclerosis is present, with no evidence of aortic valve  stenosis.  5. The inferior vena cava is normal in size with greater than 50% respiratory variability, suggesting right atrial pressure of 3 mmHg. FINDINGS  Left Ventricle: Left ventricular ejection fraction, by estimation, is 60 to 65%. The left ventricle has normal function. The left ventricle has no regional wall motion abnormalities. The left ventricular internal cavity size was normal in size. There is  no left ventricular hypertrophy. Left ventricular diastolic  parameters are consistent with Grade I diastolic dysfunction (impaired relaxation). Elevated left ventricular end-diastolic pressure. Right Ventricle: The right ventricular size is normal. No increase in right v

## 2021-09-22 NOTE — Progress Notes (Addendum)
Inpatient Rehab Admissions Coordinator:  ? ?Per therapy recommendations,  patient was screened for CIR candidacy by Clemens Catholic, MS, CCC-SLP. At this time, Pt. Remains observation status for subacute stroke.. I will not place a consult at this time but will re-screen if Pt. Status changes to inpatient.  ? ?Clemens Catholic, MS, CCC-SLP ?Rehab Admissions Coordinator  ?[phone] (celll) ?[phone] (office) ? ?

## 2021-09-23 DIAGNOSIS — F419 Anxiety disorder, unspecified: Secondary | ICD-10-CM | POA: Diagnosis present

## 2021-09-23 DIAGNOSIS — F418 Other specified anxiety disorders: Secondary | ICD-10-CM | POA: Diagnosis not present

## 2021-09-23 DIAGNOSIS — N1832 Chronic kidney disease, stage 3b: Secondary | ICD-10-CM | POA: Diagnosis not present

## 2021-09-23 DIAGNOSIS — I639 Cerebral infarction, unspecified: Secondary | ICD-10-CM | POA: Diagnosis not present

## 2021-09-23 DIAGNOSIS — Y92009 Unspecified place in unspecified non-institutional (private) residence as the place of occurrence of the external cause: Secondary | ICD-10-CM | POA: Diagnosis not present

## 2021-09-23 DIAGNOSIS — W19XXXA Unspecified fall, initial encounter: Secondary | ICD-10-CM | POA: Diagnosis present

## 2021-09-23 DIAGNOSIS — F32A Depression, unspecified: Secondary | ICD-10-CM | POA: Diagnosis present

## 2021-09-23 DIAGNOSIS — R4701 Aphasia: Secondary | ICD-10-CM | POA: Diagnosis present

## 2021-09-23 DIAGNOSIS — I129 Hypertensive chronic kidney disease with stage 1 through stage 4 chronic kidney disease, or unspecified chronic kidney disease: Secondary | ICD-10-CM | POA: Diagnosis present

## 2021-09-23 DIAGNOSIS — M25571 Pain in right ankle and joints of right foot: Secondary | ICD-10-CM | POA: Diagnosis present

## 2021-09-23 DIAGNOSIS — Z823 Family history of stroke: Secondary | ICD-10-CM | POA: Diagnosis not present

## 2021-09-23 DIAGNOSIS — Z87891 Personal history of nicotine dependence: Secondary | ICD-10-CM | POA: Diagnosis not present

## 2021-09-23 DIAGNOSIS — Z902 Acquired absence of lung [part of]: Secondary | ICD-10-CM | POA: Diagnosis not present

## 2021-09-23 DIAGNOSIS — Z20822 Contact with and (suspected) exposure to covid-19: Secondary | ICD-10-CM | POA: Diagnosis present

## 2021-09-23 DIAGNOSIS — Z7951 Long term (current) use of inhaled steroids: Secondary | ICD-10-CM | POA: Diagnosis not present

## 2021-09-23 DIAGNOSIS — N3281 Overactive bladder: Secondary | ICD-10-CM | POA: Diagnosis not present

## 2021-09-23 DIAGNOSIS — I672 Cerebral atherosclerosis: Secondary | ICD-10-CM | POA: Diagnosis present

## 2021-09-23 DIAGNOSIS — G8311 Monoplegia of lower limb affecting right dominant side: Secondary | ICD-10-CM | POA: Diagnosis present

## 2021-09-23 DIAGNOSIS — S0003XA Contusion of scalp, initial encounter: Secondary | ICD-10-CM | POA: Diagnosis present

## 2021-09-23 DIAGNOSIS — Z85118 Personal history of other malignant neoplasm of bronchus and lung: Secondary | ICD-10-CM | POA: Diagnosis not present

## 2021-09-23 DIAGNOSIS — I63312 Cerebral infarction due to thrombosis of left middle cerebral artery: Secondary | ICD-10-CM | POA: Diagnosis not present

## 2021-09-23 DIAGNOSIS — N390 Urinary tract infection, site not specified: Secondary | ICD-10-CM | POA: Diagnosis present

## 2021-09-23 DIAGNOSIS — R29703 NIHSS score 3: Secondary | ICD-10-CM | POA: Diagnosis present

## 2021-09-23 DIAGNOSIS — I10 Essential (primary) hypertension: Secondary | ICD-10-CM | POA: Diagnosis not present

## 2021-09-23 DIAGNOSIS — Z66 Do not resuscitate: Secondary | ICD-10-CM | POA: Diagnosis present

## 2021-09-23 DIAGNOSIS — I951 Orthostatic hypotension: Secondary | ICD-10-CM | POA: Diagnosis not present

## 2021-09-23 DIAGNOSIS — I63412 Cerebral infarction due to embolism of left middle cerebral artery: Secondary | ICD-10-CM | POA: Diagnosis present

## 2021-09-23 DIAGNOSIS — J45909 Unspecified asthma, uncomplicated: Secondary | ICD-10-CM | POA: Diagnosis present

## 2021-09-23 DIAGNOSIS — Z79899 Other long term (current) drug therapy: Secondary | ICD-10-CM | POA: Diagnosis not present

## 2021-09-23 DIAGNOSIS — E785 Hyperlipidemia, unspecified: Secondary | ICD-10-CM | POA: Diagnosis present

## 2021-09-23 MED ORDER — HYDRALAZINE HCL 20 MG/ML IJ SOLN: 5.0000 mg | Freq: Four times a day (QID) | INTRAMUSCULAR | Status: DC | PRN

## 2021-09-23 MED ORDER — LACTATED RINGERS IV SOLN: INTRAVENOUS | Status: DC

## 2021-09-23 NOTE — Progress Notes (Signed)
?PROGRESS NOTE ? ? ? ?[name]  JJK:[id number] DOB: [birth year] DOA: 09/21/2021 ?PCP: Pcp, No  ? ? ?Brief Narrative:  ?[name] is a 81 y.o. 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 Neurology was consulted. Now found with orthostatics Needs CIR. ? ? ? ?Consultants:  ?neurology ? ?Procedures:  ? ?Antimicrobials:  ?  ? ? ?Subjective: ?She can find words easier. Still having trouble with numbers. No HA, dizziness while lying in bed. ? ?Objective: ?Vitals:  ? 09/22/21 2314 09/23/21 0350 09/23/21 0800 09/23/21 1609  ?BP: (!) 149/78 (!) 152/67 (!) 181/69 (!) 155/75  ?Pulse: 75 67 67 70  ?Resp: 16 18 20 20   ?Temp: 98.4 ?F (36.9 ?C) 98 ?F (36.7 ?C) 98.2 ?F (36.8 ?C) 97.9 ?F (36.6 ?C)  ?TempSrc: Oral Oral Oral Oral  ?SpO2: 97% 99% 98%   ? ? ?Intake/Output Summary (Last 24 hours) at 09/23/2021 1700 ?Last data filed at 09/23/2021 0500 ?Gross per 24 hour  ?Intake 2532.7 ml  ?Output 400 ml  ?Net 2132.7 ml  ? ?There were no vitals filed for this visit. ? ?Examination: ?Calm, NAD ?Cta no w/r ?Reg s1/s2 no gallop ?Soft benign +bs ?No edema ?Aaoxox3 having difficulty getting numbers out. Speech more clearer today. 4/5 LE. 5/5 RE. No droop ?Mood and affect appropriate in current setting  ? ? ? ?Data Reviewed: I have personally reviewed following labs and imaging studies ? ?CBC: ?Recent Labs  ?Lab 09/21/21 ?1325 09/22/21 ?0434  ?WBC 7.7 7.1  ?NEUTROABS 5.9  --   ?HGB 12.4 12.3  ?HCT 39.2 37.1  ?MCV 97.5 95.6  ?PLT 306 286  ? ?Basic Metabolic Panel: ?Recent Labs  ?Lab 09/21/21 ?1325 09/22/21 ?0434  ?NA 140 135  ?K 4.2 4.0  ?CL 106 105  ?CO2 24 22  ?GLUCOSE 110* 91  ?BUN 21 15  ?CREATININE 1.33* 1.20*  ?CALCIUM 9.6 9.3  ? ?GFR: ?CrCl cannot be calculated (Unknown ideal weight.). ?Liver Function Tests: ?No results for input(s):  AST, ALT, ALKPHOS, BILITOT, PROT, ALBUMIN in the last 168 hours. ?No results for input(s): LIPASE, AMYLASE in the last 168 hours. ?No results for input(s): AMMONIA in the last 168 hours. ?Coagulation Profile: ?No results for input(s): INR, PROTIME in the last 168 hours. ?Cardiac Enzymes: ?No results for input(s): CKTOTAL, CKMB, CKMBINDEX, TROPONINI in the last 168 hours. ?BNP (last 3 results) ?No results for input(s): PROBNP in the last 8760 hours. ?HbA1C: ?Recent Labs  ?  09/22/21 ?0434  ?HGBA1C 5.2  ? ?CBG: ?No results for input(s): GLUCAP in the last 168 hours. ?Lipid Profile: ?Recent Labs  ?  09/22/21 ?0434  ?CHOL 151  ?HDL 53  ?[place] 69  ?TRIG 143  ?CHOLHDL 2.8  ? ?Thyroid Function Tests: ?No results for input(s): TSH, T4TOTAL, FREET4, T3FREE, THYROIDAB in the last 72 hours. ?Anemia Panel: ?No results for input(s): VITAMINB12, FOLATE, FERRITIN, TIBC, IRON, RETICCTPCT in the last 72 hours. ?Sepsis Labs: ?No results for input(s): PROCALCITON, LATICACIDVEN in the last 168 hours. ? ?Recent Results (from the past 240 hour(s))  ?Resp Panel by RT-PCR (Flu A&B, Covid) Nasopharyngeal Swab     Status: None  ? Collection Time: 09/21/21  2:10 PM  ? Specimen: Nasopharyngeal Swab; Nasopharyngeal(NP) swabs in vial transport medium  ?Result Value Ref Range Status  ? SARS Coronavirus 2 by RT PCR NEGATIVE NEGATIVE Final  ?  Comment: (NOTE) ?SARS-CoV-2 target nucleic acids are NOT DETECTED. ? ?The SARS-CoV-2 RNA is generally detectable in upper respiratory ?specimens during the acute phase of infection. The lowest ?concentration of SARS-CoV-2 viral copies this assay can detect is ?138 copies/mL. A negative result does not preclude SARS-Cov-2 ?infection and should not be used as the sole basis for treatment or ?other patient management decisions. A negative result may occur with  ?improper specimen collection/handling, submission of specimen other ?than nasopharyngeal swab, presence of viral mutation(s) within the ?areas  targeted by this assay, and inadequate number of viral ?copies(<138 copies/mL). A negative result must be combined with ?clinical observations, patient history, and epidemiological ?information. The expected result is Negative. ? ?Fact Sheet for Patients:  ?EntrepreneurPulse.com.au ? ?Fact Sheet for Healthcare Providers:  ?IncredibleEmployment.be ? ?This test is no t yet approved or cleared by the [place] FDA and  ?has been authorized for detection and/or diagnosis of SARS-CoV-2 by ?FDA under an Emergency Use Authorization (EUA). This EUA will remain  ?in effect (meaning this test can be used) for the duration of the ?COVID-19 declaration under Section 564(b)(1) of the Act, 21 ?U.S.C.section 360bbb-3(b)(1), unless the authorization is terminated  ?or revoked sooner.  ? ? ?  ? Influenza A by PCR NEGATIVE NEGATIVE Final  ? Influenza B by PCR NEGATIVE NEGATIVE Final  ?  Comment: (NOTE) ?The Xpert Xpress SARS-CoV-2/FLU/RSV plus assay is intended as an aid ?in the diagnosis of influenza from Nasopharyngeal swab specimens and ?should not be used as a sole basis for treatment. Nasal washings and ?aspirates are unacceptable for Xpert Xpress SARS-CoV-2/FLU/RSV ?testing. ? ?Fact Sheet for Patients: ?EntrepreneurPulse.com.au ? ?Fact Sheet for Healthcare Providers: ?IncredibleEmployment.be ? ?This test is not yet approved or cleared by the [place] FDA and ?has been authorized for detection and/or diagnosis of SARS-CoV-2 by ?FDA under an Emergency Use Authorization (EUA). This EUA will remain ?in effect (meaning this test can be used) for the duration of the ?COVID-19 declaration under Section 564(b)(1) of the Act, 21 U.S.C. ?section 360bbb-3(b)(1), unless the authorization is terminated or ?revoked. ? ?Performed at [facility], [place] [address] [place], [place] ?[address] ?  ?  ? ? ? ? ? ?Radiology Studies: ?DG Ankle Complete Right ? ?Result  Date: 09/21/2021 ?CLINICAL DATA:  Fall, code stroke. EXAM: RIGHT ANKLE - COMPLETE 3+ VIEW COMPARISON:  None. FINDINGS: There is no evidence of fracture, dislocation, or joint effusion. Mild degenerative changes at the ankle and midfoot. Small calcaneal spur. Enthesopathic changes at the insertion site of the Achilles tendon. Soft tissues are unremarkable. IMPRESSION: No acute fracture or dislocation. Electronically Signed   By: Brett Fairy M.D.   On: 09/21/2021 22:37  ? ?MR BRAIN WO CONTRAST ? ?Result Date: 09/22/2021 ?CLINICAL DATA:  Expressive aphasia EXAM: MRI HEAD WITHOUT CONTRAST TECHNIQUE: Multiplanar, multiecho pulse sequences of the brain and surrounding structures were obtained without intravenous contrast. COMPARISON:  Head CT 09/21/2021 FINDINGS: Brain: Early subacute infarct within the left MCA territory involving the left parietal and temporal lobes. No other site of acute ischemia. There are old infarcts of the left basal ganglia and both cerebellar hemispheres. No acute or chronic hemorrhage. There is multifocal hyperintense T2-weighted signal within the white matter. Parenchymal volume and CSF spaces are normal. The midline structures are normal. Vascular: Major flow voids are preserved. Skull and upper cervical spine: Normal calvarium and skull base. Visualized upper cervical spine and soft tissues are normal. Sinuses/Orbits:No paranasal sinus fluid levels or advanced mucosal thickening.  No mastoid or middle ear effusion. Normal orbits. IMPRESSION: 1. Early subacute infarct of the left MCA territory involving the left parietal and temporal lobes. No hemorrhage or mass effect. 2. Old infarcts of the left basal ganglia and both cerebellar hemispheres. Electronically Signed   By: Ulyses Jarred M.D.   On: 09/22/2021 00:08  ? ?ECHOCARDIOGRAM COMPLETE ? ?Result Date: 09/22/2021 ?   ECHOCARDIOGRAM REPORT   Patient Name:   [name] Date of Exam: 09/22/2021 Medical Rec #:  [id number]          Height:        63.0 in Accession #:    [id number]         Weight:       170.0 lb Date of Birth:  06/01/1941           BSA:          1.805 m? Patient Age:    66 years           BP:           160/77 mmHg Patient Gender: F

## 2021-09-23 NOTE — Progress Notes (Signed)
Inpatient Rehab Admissions Coordinator:  ? ?Pt. Status changed to inpatient, I will place CIR consult.  ? ?Clemens Catholic, MS, CCC-SLP ?Rehab Admissions Coordinator  ?[phone] (celll) ?[phone] (office) ? ?

## 2021-09-23 NOTE — [place] FL2 (Signed)
?[place] MEDICAID FL2 LEVEL OF CARE SCREENING TOOL  ?  ? ?IDENTIFICATION  ?Patient Name: ?[name] Birthdate: 1941-04-09 Sex: female Admission Date (Current Location): ?09/21/2021  ?[place] and [place] Number: ? [place] ?  Facility and Address:  ?[facility], [place] [address], [place],  [address] ?     Provider Number: ?[id number]  ?Attending Physician Name and Address:  ?Nolberto Hanlon, MD ? Relative Name and Phone Number:  ?  ?   ?Current Level of Care: ?Hospital Recommended Level of Care: ?[place] Prior Approval Number: ?  ? ?Date Approved/Denied: ?  PASRR Number: ?[id number] A ? ?Discharge Plan: ?SNF ?  ? ?Current Diagnoses: ?Patient Active Problem List  ? Diagnosis Date Noted  ? Aphasia due to acute cerebrovascular accident (CVA) ([place]) 09/21/2021  ? Chronic kidney disease, stage 3b ([place]) 09/21/2021  ? Depression with anxiety 09/21/2021  ? Hypertension 09/21/2021  ? Asthma 09/21/2021  ? Fall at home, initial encounter 09/21/2021  ? Right ankle pain 09/21/2021  ? ? ?Orientation RESPIRATION BLADDER Height & Weight   ?  ?Self ? Normal Incontinent, External catheter Weight:   ?Height:     ?BEHAVIORAL SYMPTOMS/MOOD NEUROLOGICAL BOWEL NUTRITION STATUS  ?    Continent Diet (See DC Summary)  ?AMBULATORY STATUS COMMUNICATION OF NEEDS Skin   ?Extensive Assist Verbally Normal ?  ?  ?  ?    ?     ?     ? ? ?Personal Care Assistance Level of Assistance  ?Bathing, Feeding, Dressing Bathing Assistance: Maximum assistance ?Feeding assistance: Independent ?Dressing Assistance: Limited assistance ?   ? ?Functional Limitations Info  ?    ?  ?   ? ? ?SPECIAL CARE FACTORS FREQUENCY  ?PT (By licensed PT), OT (By licensed OT)   ?  ?PT Frequency: 5x/week ?OT Frequency: 5x/week ?  ?  ?  ?   ? ? ?Contractures Contractures Info: Not present  ? ? ?Additional Factors Info  ?Code Status, Allergies, Psychotropic Code Status Info: DNR ?Allergies Info: NKA ?Psychotropic Info: Prozac ?  ?  ?    ? ?Current Medications (09/23/2021):  This is the current hospital active medication list ?Current Facility-Administered Medications  ?Medication Dose Route Frequency Provider Last Rate Last Admin  ?  stroke: mapping our early stages of recovery book   Does not apply Once Zada Finders R, MD      ? 0.9 %  sodium chloride infusion   Intravenous Continuous Donnetta Simpers, MD 100 mL/hr at 09/23/21 0424 New Bag at 09/23/21 0424  ? acetaminophen (TYLENOL) tablet 650 mg  650 mg Oral Q4H PRN Lenore Cordia, MD   650 mg at 09/22/21 2255  ? Or  ? acetaminophen (TYLENOL) 160 MG/5ML solution 650 mg  650 mg Per Tube Q4H PRN Lenore Cordia, MD      ? Or  ? acetaminophen (TYLENOL) suppository 650 mg  650 mg Rectal Q4H PRN Lenore Cordia, MD      ? albuterol (PROVENTIL) (2.5 MG/3ML) 0.083% nebulizer solution 2.5 mg  2.5 mg Inhalation Q6H PRN Lenore Cordia, MD      ? aspirin chewable tablet 81 mg  81 mg Oral Daily Lenore Cordia, MD   81 mg at 09/23/21 0849  ? clopidogrel (PLAVIX) tablet 75 mg  75 mg Oral Daily Lenore Cordia, MD   75 mg at 09/23/21 0849  ? dorzolamide-timolol (COSOPT) 22.3-6.8 MG/ML ophthalmic solution 1 drop  1 drop Both Eyes BID  Lenore Cordia, MD   1 drop at 09/23/21 9166  ? enoxaparin (LOVENOX) injection 40 mg  40 mg Subcutaneous Q24H Lenore Cordia, MD   40 mg at 09/22/21 2255  ? famotidine (PEPCID) tablet 20 mg  20 mg Oral Daily Lenore Cordia, MD   20 mg at 09/23/21 0849  ? fenofibrate tablet 160 mg  160 mg Oral Daily Rosalin Hawking, MD   160 mg at 09/23/21 0849  ? FLUoxetine (PROZAC) capsule 40 mg  40 mg Oral Daily Lenore Cordia, MD   40 mg at 09/23/21 0849  ? gabapentin (NEURONTIN) capsule 100 mg  100 mg Oral TID Lenore Cordia, MD   100 mg at 09/23/21 0849  ? mometasone-formoterol (DULERA) 100-5 MCG/ACT inhaler 2 puff  2 puff Inhalation BID Lenore Cordia, MD   2 puff at 09/22/21 2049  ? senna-docusate (Senokot-S) tablet 1 tablet  1 tablet Oral QHS PRN Lenore Cordia, MD       ? ? ? ?Discharge Medications: ?Please see discharge summary for a list of discharge medications. ? ?Relevant Imaging Results: ? ?Relevant Lab Results: ? ? ?Additional Information ?SSN: [id number]. Pfizer vaccines 08/29/19, 09/19/19, 04/30/20, Moderna 01/30/21 ? ?Lissa Morales Suezette Lafave, LCSW ? ? ? ? ?

## 2021-09-23 NOTE — Progress Notes (Signed)
Physical Therapy Treatment ?Patient Details ?Name: [name] ?MRN: [id number] ?DOB: 05-09-1941 ?Today's Date: 09/23/2021 ? ? ?History of Present Illness Pt is a 81 yo female presenting to the ED 09/21/21 for R sided weakness and aphasia. 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 PMH includes HTN, CKD, and lung CA s/p R lower lobe lobectomy. ? ?  ?PT Comments  ? ? Patient continues to be limited by symptomatic orthostasis (BPs below). Unable to stand for 3 minute reading due to lightheadedness. Patient able to bear weight on RLE and required incr time and cues to advance RLE. Able to progress to sit up in recliner with BP stable once feet elevated.  ? ?Supine 166/93 HR 74 ?Sit      160/83   76 ?Stand 121/89  76 ?Sitting feet elevated 164/81  70 ? ?   ?Recommendations for follow up therapy are one component of a multi-disciplinary discharge planning process, led by the attending physician.  Recommendations may be updated based on patient status, additional functional criteria and insurance authorization. ? ?Follow Up Recommendations ? Acute inpatient rehab (3hours/day) ?  ?  ?Assistance Recommended at Discharge Frequent or constant Supervision/Assistance  ?Patient can return home with the following A little help with walking and/or transfers;A little help with bathing/dressing/bathroom;Assistance with cooking/housework;Help with stairs or ramp for entrance;Assist for transportation ?  ?Equipment Recommendations ? Other (comment) (TBD)  ?  ?Recommendations for Other Services   ? ? ?  ?Precautions / Restrictions Precautions ?Precautions: Fall ?Restrictions ?Weight Bearing Restrictions: No  ?  ? ?Mobility ? Bed Mobility ?Overal bed mobility: Needs Assistance ?Bed Mobility: Supine to Sit ?  ?  ?Supine to sit: Min guard, HOB elevated ?  ?  ?General bed mobility comments: incr  time and effort ?  ? ?Transfers ?Overall transfer level: Needs assistance ?Equipment used: 2 person hand held assist ?Transfers: Sit to/from Stand, Bed to chair/wheelchair/BSC ?Sit to Stand: Min assist, +2 physical assistance ?  ?Step pivot transfers: Min assist, +2 physical assistance ?  ?  ?  ?General transfer comment: Steadying assist upon stnding; BP dropped and pivoted to chair with incr in sitting ?  ? ?Ambulation/Gait ?  ?  ?  ?  ?  ?  ?Pre-gait activities: pivotal steps to her right bed to chair; required incr time/attempts to get RLE to advance, no buckling ?General Gait Details: did not progress beyond chair due to orthostasis with symptoms ? ? ?Stairs ?  ?  ?  ?  ?  ? ? ?Wheelchair Mobility ?  ? ?Modified Rankin (Stroke Patients Only) ?Modified Rankin (Stroke Patients Only) ?Pre-Morbid Rankin Score: No symptoms ?Modified Rankin: Moderately severe disability ? ? ?  ?Balance Overall balance assessment: Needs assistance ?Sitting-balance support: No upper extremity supported, Feet supported ?Sitting balance-Leahy Scale: Fair ?  ?  ?Standing balance support: Bilateral upper extremity supported ?Standing balance-Leahy Scale: Poor ?Standing balance comment: reliant on BUE support ?  ?  ?  ?  ?  ?  ?  ?  ?  ?  ?  ?  ? ?  ?Cognition Arousal/Alertness: Awake/alert ?Behavior During Therapy: [facility] for tasks assessed/performed ?Overall Cognitive Status: Difficult to assess ?  ?  ?  ?  ?  ?  ?  ?  ?  ?  ?  ?  ?  ?  ?  ?  ?  ?  ?  ? ?  ?  Exercises   ? ?  ?General Comments General comments (skin integrity, edema, etc.): daughter present ?  ?  ? ?Pertinent Vitals/Pain Pain Assessment ?Pain Assessment: No/denies pain  ? ? ?Home Living   ?  ?  ?  ?  ?  ?  ?  ?  ?  ?   ?  ?Prior Function    ?  ?  ?   ? ?PT Goals (current goals can now be found in the care plan section) Acute Rehab PT Goals ?Patient Stated Goal: to be independent ?Time For Goal Achievement: 10/06/21 ?Potential to Achieve Goals: Good ?Progress towards PT goals:  Progressing toward goals ? ?  ?Frequency ? ? ? Min 4X/week ? ? ? ?  ?PT Plan Current plan remains appropriate  ? ? ?Co-evaluation   ?  ?  ?  ?  ? ?  ?AM-PAC PT "6 Clicks" Mobility   ?Outcome Measure ? Help needed turning from your back to your side while in a flat bed without using bedrails?: A Little ?Help needed moving from lying on your back to sitting on the side of a flat bed without using bedrails?: A Lot ?Help needed moving to and from a bed to a chair (including a wheelchair)?: Total ?Help needed standing up from a chair using your arms (e.g., wheelchair or bedside chair)?: A Lot ?Help needed to walk in hospital room?: Total ?Help needed climbing 3-5 steps with a railing? : Total ?6 Click Score: 10 ? ?  ?End of Session Equipment Utilized During Treatment: Gait belt ?Activity Tolerance: Treatment limited secondary to medical complications (Comment) (limited by orthostasis) ?Patient left: with call bell/phone within reach;in chair;with chair alarm set;with family/visitor present (on stretcher in ED) ?Nurse Communication: Mobility status ?PT Visit Diagnosis: Unsteadiness on feet (R26.81);Muscle weakness (generalized) (M62.81) ?  ? ? ?Time: 3159-4585 ?PT Time Calculation (min) (ACUTE ONLY): 25 min ? ?Charges:  $Therapeutic Activity: 23-37 mins          ?          ? ? ?Arby Barrette, PT ?Acute Rehabilitation Services  ?Pager [phone] ?Office [phone] ? ? ? ?Jeanie Cooks Shadee Rathod ?09/23/2021, 1:58 PM ? ?

## 2021-09-23 NOTE — Progress Notes (Signed)
Speech Language Pathology Treatment: Cognitive-Linquistic  ?Patient Details ?Name: [name] ?MRN: [id number] ?DOB: 1940/08/06 ?Today's Date: 09/23/2021 ?Time: 6237-6283 ?SLP Time Calculation (min) (ACUTE ONLY): 55 min ? ?Assessment / Plan / Recommendation ?Clinical Impression ? Pt demonstrated excellent gains in communication today.  Initially, she continued to have difficulty with body part identification when combined with left/right discrimination. When cued to repeat instruction aloud, her accuracy improved until she completed 10/10 trials successfully.  She demonstrated ability to self-cue for word retrieval by giving herself a sentence cue (e.g., "I brush my teeth with a ......") and by describing items (suggesting language access is improving and more motor plan deficits may be revealing themselves). Propositional/novel speech tasks were difficult initially, but as the session progressed her spontaneous speech became more fluent and word-retrieval pauses shortened in length. She has specific difficulty with number access - worked on printing numbers and spelling the numbers as another self-cueing method. Needed mod assist with this task. Pt with good awareness of issues and demonstrated carryover even during single session. She is an excellent candidate for AIR and prognosis for recovery of speech is optimistic.  ?HPI HPI: 81 y.o. female presented to the ED for evaluation of aphasia after fall. 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 ?  ?   ?SLP Plan ? Continue with current plan of care ? ?  ?  ?Recommendations for follow up therapy are one component of a  multi-disciplinary discharge planning process, led by the attending physician.  Recommendations may be updated based on patient status, additional functional criteria and insurance authorization. ?  ? ?Recommendations  ?   ?   ?    ?   ? ? ? ? Oral Care Recommendations: Oral care BID ?Follow Up Recommendations: Acute inpatient rehab (3hours/day) ?Assistance recommended at discharge: Intermittent Supervision/Assistance ?SLP Visit Diagnosis: Aphasia (R47.01) ?Plan: Continue with current plan of care ? ? ? ? ?  ?  ? ?[name] L. Emily Spurrier, MA CCC/SLP ?Acute Rehabilitation Services ?Office number [phone] ?Pager [phone] ? ?[name] [name] ? ?09/23/2021, 1:24 PM ?

## 2021-09-24 ENCOUNTER — Inpatient Hospital Stay (HOSPITAL_COMMUNITY)
Admission: RE | Admit: 2021-09-24 | Discharge: 2021-10-09 | DRG: 057 | Disposition: A | Discharge status: Home Health | Payer: Medicare Other | Source: Intra-hospital | Attending: Physical Medicine and Rehabilitation | Admitting: Physical Medicine and Rehabilitation

## 2021-09-24 ENCOUNTER — Other Ambulatory Visit: Payer: Self-pay

## 2021-09-24 ENCOUNTER — Encounter (HOSPITAL_COMMUNITY): Payer: Self-pay | Admitting: Physical Medicine and Rehabilitation

## 2021-09-24 DIAGNOSIS — Z87891 Personal history of nicotine dependence: Secondary | ICD-10-CM | POA: Diagnosis not present

## 2021-09-24 DIAGNOSIS — M1990 Unspecified osteoarthritis, unspecified site: Secondary | ICD-10-CM | POA: Diagnosis present

## 2021-09-24 DIAGNOSIS — N1832 Chronic kidney disease, stage 3b: Secondary | ICD-10-CM

## 2021-09-24 DIAGNOSIS — J45991 Cough variant asthma: Secondary | ICD-10-CM | POA: Diagnosis present

## 2021-09-24 DIAGNOSIS — Z9109 Other allergy status, other than to drugs and biological substances: Secondary | ICD-10-CM | POA: Diagnosis not present

## 2021-09-24 DIAGNOSIS — Z803 Family history of malignant neoplasm of breast: Secondary | ICD-10-CM | POA: Diagnosis not present

## 2021-09-24 DIAGNOSIS — N3281 Overactive bladder: Secondary | ICD-10-CM | POA: Diagnosis not present

## 2021-09-24 DIAGNOSIS — I10 Essential (primary) hypertension: Secondary | ICD-10-CM | POA: Diagnosis present

## 2021-09-24 DIAGNOSIS — Z7951 Long term (current) use of inhaled steroids: Secondary | ICD-10-CM

## 2021-09-24 DIAGNOSIS — I63312 Cerebral infarction due to thrombosis of left middle cerebral artery: Secondary | ICD-10-CM | POA: Diagnosis not present

## 2021-09-24 DIAGNOSIS — E781 Pure hyperglyceridemia: Secondary | ICD-10-CM | POA: Diagnosis present

## 2021-09-24 DIAGNOSIS — Z888 Allergy status to other drugs, medicaments and biological substances status: Secondary | ICD-10-CM | POA: Diagnosis not present

## 2021-09-24 DIAGNOSIS — N189 Chronic kidney disease, unspecified: Secondary | ICD-10-CM

## 2021-09-24 DIAGNOSIS — I69393 Ataxia following cerebral infarction: Secondary | ICD-10-CM

## 2021-09-24 DIAGNOSIS — Z96652 Presence of left artificial knee joint: Secondary | ICD-10-CM | POA: Diagnosis present

## 2021-09-24 DIAGNOSIS — Z886 Allergy status to analgesic agent status: Secondary | ICD-10-CM

## 2021-09-24 DIAGNOSIS — Z7982 Long term (current) use of aspirin: Secondary | ICD-10-CM | POA: Diagnosis not present

## 2021-09-24 DIAGNOSIS — Z823 Family history of stroke: Secondary | ICD-10-CM

## 2021-09-24 DIAGNOSIS — Z79899 Other long term (current) drug therapy: Secondary | ICD-10-CM

## 2021-09-24 DIAGNOSIS — H409 Unspecified glaucoma: Secondary | ICD-10-CM | POA: Diagnosis present

## 2021-09-24 DIAGNOSIS — R350 Frequency of micturition: Secondary | ICD-10-CM

## 2021-09-24 DIAGNOSIS — I69351 Hemiplegia and hemiparesis following cerebral infarction affecting right dominant side: Secondary | ICD-10-CM | POA: Diagnosis not present

## 2021-09-24 DIAGNOSIS — K219 Gastro-esophageal reflux disease without esophagitis: Secondary | ICD-10-CM | POA: Diagnosis not present

## 2021-09-24 DIAGNOSIS — Z8249 Family history of ischemic heart disease and other diseases of the circulatory system: Secondary | ICD-10-CM

## 2021-09-24 DIAGNOSIS — I69320 Aphasia following cerebral infarction: Secondary | ICD-10-CM | POA: Diagnosis not present

## 2021-09-24 DIAGNOSIS — E559 Vitamin D deficiency, unspecified: Secondary | ICD-10-CM

## 2021-09-24 DIAGNOSIS — J45909 Unspecified asthma, uncomplicated: Secondary | ICD-10-CM

## 2021-09-24 DIAGNOSIS — G4700 Insomnia, unspecified: Secondary | ICD-10-CM

## 2021-09-24 DIAGNOSIS — Z85118 Personal history of other malignant neoplasm of bronchus and lung: Secondary | ICD-10-CM

## 2021-09-24 DIAGNOSIS — Z825 Family history of asthma and other chronic lower respiratory diseases: Secondary | ICD-10-CM

## 2021-09-24 DIAGNOSIS — I129 Hypertensive chronic kidney disease with stage 1 through stage 4 chronic kidney disease, or unspecified chronic kidney disease: Secondary | ICD-10-CM | POA: Diagnosis present

## 2021-09-24 DIAGNOSIS — F32A Depression, unspecified: Secondary | ICD-10-CM | POA: Diagnosis present

## 2021-09-24 DIAGNOSIS — F418 Other specified anxiety disorders: Secondary | ICD-10-CM

## 2021-09-24 DIAGNOSIS — M8580 Other specified disorders of bone density and structure, unspecified site: Secondary | ICD-10-CM | POA: Diagnosis present

## 2021-09-24 DIAGNOSIS — F419 Anxiety disorder, unspecified: Secondary | ICD-10-CM | POA: Diagnosis present

## 2021-09-24 DIAGNOSIS — I639 Cerebral infarction, unspecified: Secondary | ICD-10-CM | POA: Diagnosis present

## 2021-09-24 DIAGNOSIS — Z801 Family history of malignant neoplasm of trachea, bronchus and lung: Secondary | ICD-10-CM

## 2021-09-24 LAB — CREATININE, SERUM
Creatinine, Ser: 1.16 mg/dL — ABNORMAL HIGH (ref 0.44–1.00)
GFR, Estimated: 47 mL/min — ABNORMAL LOW (ref >60)

## 2021-09-24 MED ORDER — FLUOXETINE HCL 20 MG PO CAPS
40.0000 mg | ORAL_CAPSULE | Freq: Every day | ORAL | Status: DC
  Administered 2021-09-25 – 2021-10-09 (×15): 40 mg via ORAL
  Filled 2021-09-24 (×17): qty 2

## 2021-09-24 MED ORDER — GUAIFENESIN-DM 100-10 MG/5ML PO SYRP: 5.0000 mL | ORAL_SOLUTION | Freq: Four times a day (QID) | ORAL | Status: DC | PRN

## 2021-09-24 MED ORDER — LOSARTAN POTASSIUM 50 MG PO TABS: 50.0000 mg | ORAL_TABLET | Freq: Every day | ORAL | Status: DC

## 2021-09-24 MED ORDER — DARIFENACIN HYDROBROMIDE ER 7.5 MG PO TB24
7.5000 mg | ORAL_TABLET | Freq: Every day | ORAL | Status: DC
  Administered 2021-09-24 – 2021-10-09 (×16): 7.5 mg via ORAL
  Filled 2021-09-24 (×16): qty 1

## 2021-09-24 MED ORDER — MOMETASONE FURO-FORMOTEROL FUM 100-5 MCG/ACT IN AERO
2.0000 | INHALATION_SPRAY | Freq: Two times a day (BID) | RESPIRATORY_TRACT | Status: DC
  Administered 2021-09-24 – 2021-10-09 (×29): 2 via RESPIRATORY_TRACT
  Filled 2021-09-24: qty 8.8

## 2021-09-24 MED ORDER — BISACODYL 10 MG RE SUPP: 10.0000 mg | Freq: Every day | RECTAL | Status: DC | PRN

## 2021-09-24 MED ORDER — FAMOTIDINE 20 MG PO TABS
20.0000 mg | ORAL_TABLET | Freq: Every day | ORAL | Status: DC
  Administered 2021-09-25 – 2021-10-09 (×15): 20 mg via ORAL
  Filled 2021-09-24 (×15): qty 1

## 2021-09-24 MED ORDER — PROCHLORPERAZINE 25 MG RE SUPP: 12.5000 mg | Freq: Four times a day (QID) | RECTAL | Status: DC | PRN

## 2021-09-24 MED ORDER — BLOOD PRESSURE CONTROL BOOK
Freq: Once | Status: AC
  Administered 2021-09-24: 1
  Filled 2021-09-24: qty 1

## 2021-09-24 MED ORDER — CLOPIDOGREL BISULFATE 75 MG PO TABS
75.0000 mg | ORAL_TABLET | Freq: Every day | ORAL | Status: DC
Start: 2021-09-25 — End: 2021-10-08

## 2021-09-24 MED ORDER — HYDRALAZINE HCL 25 MG PO TABS: 25.0000 mg | ORAL_TABLET | Freq: Three times a day (TID) | ORAL | 11 refills | Status: DC | PRN

## 2021-09-24 MED ORDER — FENOFIBRATE 160 MG PO TABS
160.0000 mg | ORAL_TABLET | Freq: Every day | ORAL | Status: DC
  Administered 2021-09-25 – 2021-10-09 (×15): 160 mg via ORAL
  Filled 2021-09-24 (×15): qty 1

## 2021-09-24 MED ORDER — LIDOCAINE HCL URETHRAL/MUCOSAL 2 % EX GEL
CUTANEOUS | Status: DC | PRN
Start: 2021-09-24 — End: 2021-10-09

## 2021-09-24 MED ORDER — PROCHLORPERAZINE MALEATE 5 MG PO TABS: 5.0000 mg | ORAL_TABLET | Freq: Four times a day (QID) | ORAL | Status: DC | PRN

## 2021-09-24 MED ORDER — LACTATED RINGERS IV SOLN
INTRAVENOUS | Status: DC
Start: 2021-09-24 — End: 2021-09-24

## 2021-09-24 MED ORDER — SENNOSIDES-DOCUSATE SODIUM 8.6-50 MG PO TABS: 1.0000 | ORAL_TABLET | Freq: Every evening | ORAL | Status: DC | PRN

## 2021-09-24 MED ORDER — GABAPENTIN 100 MG PO CAPS
100.0000 mg | ORAL_CAPSULE | Freq: Three times a day (TID) | ORAL | Status: DC
  Administered 2021-09-24 – 2021-10-09 (×44): 100 mg via ORAL
  Filled 2021-09-24 (×44): qty 1

## 2021-09-24 MED ORDER — PROCHLORPERAZINE EDISYLATE 10 MG/2ML IJ SOLN: 5.0000 mg | Freq: Four times a day (QID) | INTRAMUSCULAR | Status: DC | PRN

## 2021-09-24 MED ORDER — DIPHENHYDRAMINE HCL 12.5 MG/5ML PO ELIX: 12.5000 mg | ORAL_SOLUTION | Freq: Four times a day (QID) | ORAL | Status: DC | PRN

## 2021-09-24 MED ORDER — AMLODIPINE BESYLATE 10 MG PO TABS: 10.0000 mg | ORAL_TABLET | Freq: Every morning | ORAL | Status: DC

## 2021-09-24 MED ORDER — CLOPIDOGREL BISULFATE 75 MG PO TABS
75.0000 mg | ORAL_TABLET | Freq: Every day | ORAL | Status: DC
  Administered 2021-09-25 – 2021-10-09 (×15): 75 mg via ORAL
  Filled 2021-09-24 (×15): qty 1

## 2021-09-24 MED ORDER — ENOXAPARIN SODIUM 40 MG/0.4ML IJ SOSY
40.0000 mg | PREFILLED_SYRINGE | INTRAMUSCULAR | Status: DC
Start: 2021-09-24 — End: 2021-10-02
  Administered 2021-09-24 – 2021-10-02 (×8): 40 mg via SUBCUTANEOUS
  Filled 2021-09-24 (×9): qty 0.4

## 2021-09-24 MED ORDER — CLOPIDOGREL BISULFATE 75 MG PO TABS: 75.0000 mg | ORAL_TABLET | Freq: Every day | ORAL | Status: DC

## 2021-09-24 MED ORDER — ACETAMINOPHEN 325 MG PO TABS: 650.0000 mg | ORAL_TABLET | ORAL | Status: DC | PRN

## 2021-09-24 MED ORDER — DORZOLAMIDE HCL-TIMOLOL MAL 2-0.5 % OP SOLN
1.0000 [drp] | Freq: Two times a day (BID) | OPHTHALMIC | Status: DC
  Administered 2021-09-24 – 2021-10-09 (×30): 1 [drp] via OPHTHALMIC
  Filled 2021-09-24: qty 10

## 2021-09-24 MED ORDER — ASPIRIN EC 81 MG PO TBEC
81.0000 mg | DELAYED_RELEASE_TABLET | Freq: Every day | ORAL | 3 refills | Status: DC
Start: 2021-09-24 — End: 2021-10-08

## 2021-09-24 MED ORDER — TRAZODONE HCL 50 MG PO TABS: 25.0000 mg | ORAL_TABLET | Freq: Every evening | ORAL | Status: DC | PRN

## 2021-09-24 MED ORDER — ASPIRIN 81 MG PO CHEW
81.0000 mg | CHEWABLE_TABLET | Freq: Every day | ORAL | Status: DC
  Administered 2021-09-25 – 2021-10-09 (×15): 81 mg via ORAL
  Filled 2021-09-24 (×15): qty 1

## 2021-09-24 MED ORDER — ALUM & MAG HYDROXIDE-SIMETH 200-200-20 MG/5ML PO SUSP: 30.0000 mL | ORAL | Status: DC | PRN

## 2021-09-24 MED ORDER — ALBUTEROL SULFATE (2.5 MG/3ML) 0.083% IN NEBU: 2.5000 mg | INHALATION_SOLUTION | Freq: Four times a day (QID) | RESPIRATORY_TRACT | Status: DC | PRN

## 2021-09-24 MED ORDER — POLYETHYLENE GLYCOL 3350 17 G PO PACK: 17.0000 g | PACK | Freq: Every day | ORAL | Status: DC | PRN

## 2021-09-24 MED ORDER — FLEET ENEMA 7-19 GM/118ML RE ENEM: 1.0000 | ENEMA | Freq: Once | RECTAL | Status: DC | PRN

## 2021-09-24 MED ORDER — ACETAMINOPHEN 325 MG PO TABS
325.0000 mg | ORAL_TABLET | ORAL | Status: DC | PRN
  Administered 2021-10-03: 650 mg via ORAL
  Filled 2021-09-24: qty 2

## 2021-09-24 NOTE — H&P (Shared)
? ? ? ? ? ? ? ?Physical Medicine and Rehabilitation Admission H&P ? ?  ?Chief Complaint  ?Patient presents with  ? Functional deficits secondary to   ? Stroke with aphasia  ? ? ?HPI: [name] is an 81 year old female with history of Lung CA stage I, asthma, CKD, HTN who was admitted on 09/21/21 after fall and noted to have difficulty speaking.  CT head/neck without acute abnormality and chronic lacunar infarcts in left caudate head. She developed signs of right neglect  and CTA head/neck showed occlusion of distal L-MCA branch at posterior sylvian fissure and 70% stenosis of proximal R-ICA due to predominantly calcified atherosclerosis. She was out of window for [facility] and went on to develop sided weakness and RLE numbness. MRI brain showed L-MCA infarct affecting left parietal and temporal lobes. She was found to have mildly orthostatic changes. She was treated with fluid bolus and bed rest.  Dr. Barrington Ellison that stroke was likely due to intracranial stenosis but cardiac source not ruled out. He recommended DAPT X 3 months followed by ASA alone and CardioNet monitoring after discharge.  Renal status has improved with IVF for hydration. She has had issues with right ankle pain post fall and X rays were negative for fracture. 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 CIR recommended due to functional decline.    ? ? ?Review of Systems  ?Constitutional:  Negative for chills and fever.  ?HENT:  Negative for hearing loss and tinnitus.   ?Eyes:  Negative for blurred vision and double vision.  ?Respiratory:  Positive for cough. Negative for shortness of breath.   ?Cardiovascular:  Negative for chest pain.  ?Gastrointestinal:  Positive for heartburn. Negative for constipation and nausea.  ?Genitourinary:  Positive for frequency. Negative for dysuria.  ?Musculoskeletal:  Negative for myalgias.   ?Skin:  Negative for rash.  ?Neurological:  Positive for sensory change, speech change and weakness. Negative for dizziness and headaches.  ?Psychiatric/Behavioral:  The patient is not nervous/anxious and does not have insomnia.   ? ? ?Past Medical History:  ?Diagnosis Date  ? Asthma   ? Chronic kidney disease, stage 3b ([place])   ? Depression with anxiety   ? Hypertension   ? ? ?Past Surgical History:  ?Procedure Laterality Date  ? VIDEO ASSISTED THORACOSCOPY (VATS)/WEDGE RESECTION Right 02/12/2014  ? ? ?Family History  ?Problem Relation Age of Onset  ? Stroke Father   ? ? ?Social History:  Widowed. Lives alone with her 81 year old maltese. Independent and active PTA.  Daughter is speech therapist. She reports that she quit smoking about 43 years ago. Her smoking use included cigarettes. She has never used smokeless tobacco. No history on file for alcohol use and drug use. ? ? ?Allergies: No Known Allergies ? ? ?Medications Prior to Admission  ?Medication Sig Dispense Refill  ? BESIVANCE 0.6 % SUSP Place 1 drop into the right eye 4 (four) times daily. For 2 days after eye injection    ? dorzolamide-timolol (COSOPT) 22.3-6.8 MG/ML ophthalmic solution Place 1 drop into both eyes 2 (two) times daily.    ? famotidine (PEPCID) 20 MG tablet Take 20 mg by mouth daily.    ? fenofibrate 160 MG tablet Take 160 mg by mouth daily.    ? FLUoxetine (PROZAC) 40 MG capsule Take 40 mg by mouth daily.    ? gabapentin (NEURONTIN) 100 MG capsule Take 100  mg by mouth 3 (three) times daily.    ? omeprazole (PRILOSEC) 40 MG capsule Take 40 mg by mouth daily.    ? solifenacin (VESICARE) 10 MG tablet Take 10 mg by mouth daily.    ? SYMBICORT 80-4.5 MCG/ACT inhaler Inhale 1 puff into the lungs daily as needed (shortness or breathe, wheezing).    ? traZODone (DESYREL) 50 MG tablet Take 50 mg by mouth at bedtime.    ? [DISCONTINUED] amLODipine (NORVASC) 10 MG tablet Take 10 mg by mouth every morning.    ? [DISCONTINUED] losartan (COZAAR) 50 MG  tablet Take 50 mg by mouth daily.    ? ? ? ? ?Home: ?Home Living ?Family/patient expects to be discharged to:: Private residence ?Living Arrangements: Alone ?Type of Home: House ?Home Access: Stairs to enter ?Entrance Stairs-Number of Steps: 1 ?Entrance Stairs-Rails: None ?Home Layout: One level ?Bathroom Shower/Tub: Walk-in shower ?Bathroom Toilet: Handicapped height ?Bathroom Accessibility: Yes ?Home Equipment: Conservation officer, nature (2 wheels), [employer] - single point, Grab bars - toilet, Grab bars - tub/shower, Shower seat ? Lives With: Alone ?  ?Functional History: ?Prior Function ?Prior Level of Function : Independent/Modified Independent, Driving ?Mobility Comments: Uses RW when outside of home ?ADLs Comments: ind ADLs, IADLs, driving ? ?Functional Status:  ?Mobility: ?Bed Mobility ?Overal bed mobility: Needs Assistance ?Bed Mobility: Sit to Supine ?Supine to sit: HOB elevated, Supervision ?Sit to supine: Mod assist ?General bed mobility comments: incr time and effort ?Transfers ?Overall transfer level: Needs assistance ?Equipment used: Rolling walker (2 wheels) ?Transfers: Sit to/from Stand ?Sit to Stand: Mod assist ?Bed to/from chair/wheelchair/BSC transfer type:: Step pivot ?Step pivot transfers: Min assist, +2 physical assistance ?General transfer comment: Mod A to stand from reg toilet (received in bathroom) with difficulty coordinating R LE ?Ambulation/Gait ?Ambulation/Gait assistance: Min assist, +2 safety/equipment ?Gait Distance (Feet): 10 Feet (seated rest; 15) ?Assistive device: Rolling walker (2 wheels) ?Gait Pattern/deviations: Step-through pattern, Decreased step length - right, Drifts right/left ?General Gait Details: RLE tending to fall behind and required cues to advance RLE first; imbalance during turning 180 degrees with RW on first attmept; improved on second 180 degree turn ?Gait velocity: decr ?Gait velocity interpretation: <1.8 ft/sec, indicate of risk for recurrent falls ?Pre-gait activities:  pivotal steps to her right bed to chair; required incr time/attempts to get RLE to advance, no buckling ?  ? ?ADL: ?ADL ?Overall ADL's : Needs assistance/impaired ?Grooming: Set up, Sitting, Wash/dry hands ?Grooming Details (indicate cue type and reason): after using bathroom, likely would need assist for smaller movements/bimanual task coordination ?Upper Body Dressing : Minimal assistance, Sitting ?Lower Body Dressing: Maximal assistance, Sit to/from stand, +2 for physical assistance ?Toilet Transfer: Ambulation, Rolling walker (2 wheels), Regular Toilet, Grab bars, Moderate assistance ?Toilet Transfer Details (indicate cue type and reason): a lot of difficulty standing from toilet d/t R LE soreness and proprioceptive deficits. Pt requires Min A to navigate RW from bathroom back to bed with cueing/RW manuevering when turning ?Toileting- Clothing Manipulation and Hygiene: Minimal assistance, Sitting/lateral lean, Sit to/from stand ?Toileting - Clothing Manipulation Details (indicate cue type and reason): assist for clothing mgmt/balance in standing while pt managing underwear ?Functional mobility during ADLs: Moderate assistance, Rolling walker (2 wheels), Cueing for safety, Cueing for sequencing ?General ADL Comments: Pt with continued safety concerns with ADLs/mobility due to balance deficits, proprioception and coordination deficits ? ?Cognition: ?Cognition ?Overall Cognitive Status: Impaired/Different from baseline ?Arousal/Alertness: Awake/alert ?Orientation Level: Oriented to person, Oriented to place, Oriented to situation ?Attention: Selective ?Selective Attention: Appears  intact ?Awareness: Appears intact ?Cognition ?Arousal/Alertness: Awake/alert ?Behavior During Therapy: [facility] for tasks assessed/performed ?Overall Cognitive Status: Impaired/Different from baseline ?Area of Impairment: Problem solving, Safety/judgement ?Awareness: Emergent ?Problem Solving: Slow processing, Difficulty sequencing, Requires  verbal cues, Requires tactile cues ?General Comments: Pt with word finding difficulties, signs of expressive aphasia so difficult to fully assess cognition. pt pleasant, follows one step directions with mulitmoda

## 2021-09-24 NOTE — Progress Notes (Signed)
Occupational Therapy Treatment ?Patient Details ?Name: [name] ?MRN: [id number] ?DOB: Feb 24, 1941 ?Today's Date: 09/24/2021 ? ? ?History of present illness Pt is a 81 yo female presenting to the ED 09/21/21 for R sided weakness and aphasia. 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 PMH includes HTN, CKD, and lung CA s/p R lower lobe lobectomy. ?  ?OT comments ? Pt making incremental progress towards OT goals, received in bathroom after being assisted by NT. Pt required increased assist (Mod A) to stand from toilet due to R LE deficits. Pt continues to require assist to maneuver RW, maintain balance and cue sequencing for ADL mobility using RW. Guided pt in UE HEP to maximize strength and coordination of UB with noted proprioceptive and problem-solving deficits, requiring Mod A to complete 4/4 UE exercises. Plan to further address UE coordination and proprioception in next sessions. Continue to rec AIR level therapies as pt with good rehab potential and notably below functional baseline.  ? ?BP after bathroom mobility: 151/78 ?BP after UE exercises bed level (HOB 40*): 133/78  ? ?Recommendations for follow up therapy are one component of a multi-disciplinary discharge planning process, led by the attending physician.  Recommendations may be updated based on patient status, additional functional criteria and insurance authorization. ?   ?Follow Up Recommendations ? Acute inpatient rehab (3hours/day)  ?  ?Assistance Recommended at Discharge Frequent or constant Supervision/Assistance  ?Patient can return home with the following ? A lot of help with walking and/or transfers;A lot of help with bathing/dressing/bathroom;Assistance with cooking/housework;Direct supervision/assist for medications management;Direct supervision/assist for financial management;Help with stairs  or ramp for entrance;Assist for transportation ?  ?Equipment Recommendations ? Other (comment) (defer to next venue)  ?  ?Recommendations for Other Services Rehab consult ? ?  ?Precautions / Restrictions Precautions ?Precautions: Fall ?Precaution Comments: monitor orthostatic BPs ?Restrictions ?Weight Bearing Restrictions: No  ? ? ?  ? ?Mobility Bed Mobility ?Overal bed mobility: Needs Assistance ?Bed Mobility: Sit to Supine ?  ?  ?Supine to sit: HOB elevated, Supervision ?  ?  ?General bed mobility comments: incr time and effort ?  ? ?Transfers ?Overall transfer level: Needs assistance ?Equipment used: Rolling walker (2 wheels) ?Transfers: Sit to/from Stand ?Sit to Stand: Mod assist ?  ?  ?  ?  ?  ?General transfer comment: Mod A to stand from reg toilet (received in bathroom) with difficulty coordinating R LE ?  ?  ?Balance Overall balance assessment: Needs assistance ?Sitting-balance support: No upper extremity supported, Feet supported ?Sitting balance-Leahy Scale: Fair ?  ?  ?Standing balance support: Bilateral upper extremity supported ?Standing balance-Leahy Scale: Poor ?  ?  ?  ?  ?  ?  ?  ?  ?  ?  ?  ?  ?   ? ?ADL either performed or assessed with clinical judgement  ? ?ADL Overall ADL's : Needs assistance/impaired ?  ?  ?Grooming: Set up;Sitting;Wash/dry hands ?Grooming Details (indicate cue type and reason): after using bathroom, likely would need assist for smaller movements/bimanual task coordination ?  ?  ?  ?  ?  ?  ?  ?  ?Toilet Transfer: Ambulation;Rolling walker (2 wheels);Regular Toilet;Grab bars;Moderate assistance ?Toilet Transfer Details (indicate cue type and reason): a lot of difficulty standing from toilet d/t R LE soreness and proprioceptive deficits. Pt requires Min A to navigate RW from bathroom back  to bed with cueing/RW manuevering when turning ?Toileting- Clothing Manipulation and Hygiene: Minimal assistance;Sitting/lateral lean;Sit to/from stand ?Toileting - Clothing Manipulation  Details (indicate cue type and reason): assist for clothing mgmt/balance in standing while pt managing underwear ?  ?  ?Functional mobility during ADLs: Moderate assistance;Rolling walker (2 wheels);Cueing for safety;Cueing for sequencing ?General ADL Comments: Pt with continued safety concerns with ADLs/mobility due to balance deficits, proprioception and coordination deficits ?  ? ?Extremity/Trunk Assessment Upper Extremity Assessment ?Upper Extremity Assessment: RUE deficits/detail ?RUE Deficits / Details: grossly 3+/5 MMT, decreased coordination, proprioception and sensation. often difficulty realized when she had dropped theraband out of her hand, etc ?RUE Sensation: decreased light touch;decreased proprioception ?RUE Coordination: decreased fine motor ?  ?Lower Extremity Assessment ?Lower Extremity Assessment: Defer to PT evaluation ?  ?  ?  ? ?Vision   ?Vision Assessment?: No apparent visual deficits ?  ?Perception   ?  ?Praxis   ?  ? ?Cognition Arousal/Alertness: Awake/alert ?Behavior During Therapy: [facility] for tasks assessed/performed ?Overall Cognitive Status: Impaired/Different from baseline ?Area of Impairment: Problem solving, Safety/judgement ?  ?  ?  ?  ?  ?  ?  ?  ?  ?  ?  ?  ?  ?Awareness: Emergent ?Problem Solving: Slow processing, Difficulty sequencing, Requires verbal cues, Requires tactile cues ?General Comments: Pt with word finding difficulties, signs of expressive aphasia so difficult to fully assess cognition. pt pleasant, follows one step directions with mulitmodal cues (limited at times due to proprioceptive/motor planning difficulties) ?  ?  ?   ?Exercises Exercises: General Upper Extremity ?General Exercises - Upper Extremity ?Shoulder Flexion: Strengthening, Both, 10 reps, Theraband ?Theraband Level (Shoulder Flexion): Level 1 (Yellow) ?Shoulder Horizontal ABduction: Strengthening, Both, 10 reps, Theraband ?Theraband Level (Shoulder Horizontal Abduction): Level 1 (Yellow) ?Elbow Flexion:  Strengthening, Both, 10 reps, Theraband ?Theraband Level (Elbow Flexion): Level 1 (Yellow) ?Elbow Extension: Strengthening, Both, 10 reps, Theraband ?Theraband Level (Elbow Extension): Level 1 (Yellow) ? ?  ?Shoulder Instructions   ? ? ?  ?General Comments    ? ? ?Pertinent Vitals/ Pain       Pain Assessment ?Pain Assessment: No/denies pain ? ?Home Living   ?  ?  ?  ?  ?  ?  ?  ?  ?  ?  ?  ?  ?  ?  ?  ?  ?  ?  ? ?  ?Prior Functioning/Environment    ?  ?  ?  ?   ? ?Frequency ? Min 2X/week  ? ? ? ? ?  ?Progress Toward Goals ? ?OT Goals(current goals can now be found in the care plan section) ? Progress towards OT goals: Progressing toward goals ? ?Acute Rehab OT Goals ?Patient Stated Goal: improve R LE function, go to rehab ?OT Goal Formulation: With patient ?Time For Goal Achievement: 10/06/21 ?Potential to Achieve Goals: Good ?ADL Goals ?Pt Will Perform Grooming: with supervision;sitting ?Pt Will Perform Upper Body Bathing: with supervision;sitting ?Pt Will Perform Lower Body Dressing: with min assist;sit to/from stand ?Pt Will Transfer to Toilet: with min assist;ambulating;bedside commode ?Pt Will Perform Toileting - Clothing Manipulation and hygiene: with min assist;sit to/from stand ?Pt/caregiver will Perform Home Exercise Program: Right Upper extremity;With theraband;With theraputty;With written HEP provided  ?Plan Discharge plan remains appropriate   ? ?Co-evaluation ? ? ?   ?  ?  ?  ?  ? ?  ?AM-PAC OT "6 Clicks" Daily Activity     ?Outcome Measure ? ? Help from another person  eating meals?: A Little ?Help from another person taking care of personal grooming?: A Little ?Help from another person toileting, which includes using toliet, bedpan, or urinal?: A Lot ?Help from another person bathing (including washing, rinsing, drying)?: A Lot ?Help from another person to put on and taking off regular upper body clothing?: A Little ?Help from another person to put on and taking off regular lower body clothing?: A  Lot ?6 Click Score: 15 ? ?  ?End of Session Equipment Utilized During Treatment: Gait belt;Rolling walker (2 wheels) ? ?OT Visit Diagnosis: Other abnormalities of gait and mobility (R26.89);Muscle weakness (gen

## 2021-09-24 NOTE — Discharge Summary (Signed)
Physician Discharge Summary  ?[name] DGL:[id number] DOB: 04/21/1941 DOA: 09/21/2021 ? ?PCP: Pcp, No ? ?Admit date: 09/21/2021 ?Discharge date: 09/24/2021 ? ?Admitted From: Home ?Disposition:  CIR ? ?Recommendations for Outpatient Follow-up:  ?Please call cardiology coordinator [facility] to arrange for 30 days CardioNet monitor throughout A-fib upon patient discharge from CIR. ?Allow for permissive hypertension and resume blood pressure medication gradually as instructed low(on as needed hydralazine for now, and to resume losartan in 2 days and back on Norvasc in 3 days). ?Patient on dual antiplatelet therapy aspirin and Plavix for 90 days, then aspirin alone ?Patient to follow-up with neurology and a as an outpatient, ambulatory referral has been made. ? ? ?Discharge Condition:Stable ?CODE STATUS:dnr ?Diet recommendation: Heart Healthy ? ?Brief/Interim Summary: ? ?[name] is a 81 y.o. 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 Neurology was consulted.  Recommendations for CIR. ? ?Acute CVA ?- Presenting with persistent aphasia and mild right lower extremity weakness.  ?-CTA head/neck showed occlusion of distal left MCA M2 branch and 70% stenosis of proximal right ICA ?- MRI with early subacute infarct within the left MCA territory involving the left parietal and temporal lobes.  Please see full report ?- Lower extremity venous Doppler negative for DVT ?- LDL goal less than 70 ?- A1C 5.2 ?- 3/21 echo normal EF.no IAS. ?- CTA 70% stenosis of prox. Rt ICA 2/2 predominantly calcified atherosclerosis. ?Neurology recommends 30-day CardioNet monitoring as outpatient to rule out A-fib prior to discharge ?- Continue aspirin 81 mg daily and Plavix 75 mg daily x3 months, then aspirin 81 mg alone thereafter ?PT OT recommend  acute inpatient rehab ?Allow permissive hypertension gradually normalize in 4 to 5 days  ?Follow-up neurology as outpatient ?  ?  ?Hypertension with orthostatics ?-Allow for permissive hypertension, on as needed hydralazine for systolic blood pressure> 5188, will reintroduce her home medication gradually to normalize blood pressure over the next couple days, with losartan in 2 days and Norvasc in 4 days. ?-She did have some minimal orthostatics initially, but she was not symptomatic. ?  ?Chronic kidney disease, stage 3b ([place]) ?Improving with IV hydration. ? ?  ?  ?Fall at home ?Right ankle pain ?Fall at home presumed secondary to acute CVA.  Has contusion near right temple after hitting her head.  CT head and neck negative for intracranial bleed or fracture.   ?-No evidence of fracture on imaging ?  ?  ?Asthma ?Stable without active wheezing.  Continue Dulera and albuterol as needed. ?  ?Depression with anxiety ?Continue home dose Prozac. ?  ? ?Discharge Diagnoses:  ?Principal Problem: ?  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  Hypertension ?  Chronic kidney disease, stage 3b ([place]) ?  Depression with anxiety ?  Asthma ?  Fall at home, initial encounter ?  Right ankle pain ? ? ? ?Discharge Instructions ? ?Discharge Instructions   ? ? Ambulatory referral to Neurology   Complete by: As directed ?  ? Follow up with stroke clinic NP (Jessica Diomede or Cecille Rubin, if both not available, consider Zachery Dauer, or [place]) at [facility] in about 4 weeks. Thanks.  ? Diet - low sodium heart healthy   Complete by: As directed ?  ? Increase activity slowly   Complete by: As directed ?  ? ?  ? ?  Allergies as of 09/24/2021   ?No Known Allergies ?  ? ?  ?Medication List  ?  ? ?TAKE these medications   ? ?acetaminophen 325 MG tablet ?Commonly known as: TYLENOL ?Take 2 tablets (650 mg total) by mouth every 4 (four) hours as needed for mild pain (or temp > 37.5 C (99.5 F)). ?  ?amLODipine 10 MG  tablet ?Commonly known as: NORVASC ?Take 1 tablet (10 mg total) by mouth every morning. ?Start taking on: September 27, 2021 ?What changed: These instructions start on September 27, 2021. If you are unsure what to do until then, ask your doctor or other care provider. ?  ?aspirin EC 81 MG tablet ?Take 1 tablet (81 mg total) by mouth daily. Swallow whole. ?  ?Besivance 0.6 % Susp ?Generic drug: Besifloxacin HCl ?Place 1 drop into the right eye 4 (four) times daily. For 2 days after eye injection ?  ?clopidogrel 75 MG tablet ?Commonly known as: PLAVIX ?Take 1 tablet (75 mg total) by mouth daily. For total of 90 days ?Start taking on: September 25, 2021 ?  ?dorzolamide-timolol 22.3-6.8 MG/ML ophthalmic solution ?Commonly known as: COSOPT ?Place 1 drop into both eyes 2 (two) times daily. ?  ?famotidine 20 MG tablet ?Commonly known as: PEPCID ?Take 20 mg by mouth daily. ?  ?fenofibrate 160 MG tablet ?Take 160 mg by mouth daily. ?  ?FLUoxetine 40 MG capsule ?Commonly known as: PROZAC ?Take 40 mg by mouth daily. ?  ?gabapentin 100 MG capsule ?Commonly known as: NEURONTIN ?Take 100 mg by mouth 3 (three) times daily. ?  ?hydrALAZINE 25 MG tablet ?Commonly known as: APRESOLINE ?Take 1 tablet (25 mg total) by mouth 3 (three) times daily as needed (For SBP > 165). ?  ?losartan 50 MG tablet ?Commonly known as: COZAAR ?Take 1 tablet (50 mg total) by mouth daily. ?Start taking on: September 26, 2021 ?What changed: These instructions start on September 26, 2021. If you are unsure what to do until then, ask your doctor or other care provider. ?  ?omeprazole 40 MG capsule ?Commonly known as: PRILOSEC ?Take 40 mg by mouth daily. ?  ?solifenacin 10 MG tablet ?Commonly known as: VESICARE ?Take 10 mg by mouth daily. ?  ?Symbicort 80-4.5 MCG/ACT inhaler ?Generic drug: budesonide-formoterol ?Inhale 1 puff into the lungs daily as needed (shortness or breathe, wheezing). ?  ?traZODone 50 MG tablet ?Commonly known as: DESYREL ?Take 50 mg by mouth at bedtime. ?   ? ?  ? ? Follow-up Information   ? ? [facility]. Schedule an appointment as soon as possible for a visit in 1 month(s).   ?Specialty: Neurology ?Why: stroke clinic ?Contact information: ?[place] ?[phone] ? ?  ?  ? ?  ?  ? ?  ? ?No Known Allergies ? ?Consultations: ?neurology ? ? ?Procedures/Studies: ?DG Ankle Complete Right ? ?Result Date: 09/21/2021 ?CLINICAL DATA:  Fall, code stroke. EXAM: RIGHT ANKLE - COMPLETE 3+ VIEW COMPARISON:  None. FINDINGS: There is no evidence of fracture, dislocation, or joint effusion. Mild degenerative changes at the ankle and midfoot. Small calcaneal spur. Enthesopathic changes at the insertion site of the Achilles tendon. Soft tissues are unremarkable. IMPRESSION: No acute fracture or dislocation. Electronically Signed   By: Brett Fairy M.D.   On: 09/21/2021 22:37  ? ?CT HEAD WO CONTRAST (5MM) ? ?Result Date: 09/21/2021 ?CLINICAL DATA:  Fall. EXAM: CT HEAD WITHOUT CONTRAST CT CERVICAL SPINE WITHOUT CONTRAST TECHNIQUE: Multidetector CT imaging of  the head and cervical spine was performed following the standard protocol without intravenous contrast. Multiplanar CT image reconstructions of the cervical spine were also generated. RADIATION DOSE REDUCTION: This exam was performed according to the departmental dose-optimization program which includes automated exposure control, adjustment of the mA and/or kV according to patient size and/or use of iterative reconstruction technique. COMPARISON:  CT head and cervical spine dated April 17, 2015. FINDINGS: CT HEAD FINDINGS Brain: No evidence of acute infarction, hemorrhage, hydrocephalus, extra-axial collection or mass lesion/mass effect. Chronic lacunar infarcts in the left caudate head, new since 2016. Stable mild atrophy. Vascular: Atherosclerotic vascular calcification of the carotid siphons. No hyperdense vessel. Skull: Normal. Negative for fracture or focal  lesion. Sinuses/Orbits: No acute finding. Other: Small right frontoparietal scalp hematoma. CT CERVICAL SPINE FINDINGS Alignment: No traumatic malalignment. Straightening of the normal cervical lordosis

## 2021-09-24 NOTE — Progress Notes (Signed)
?PROGRESS NOTE ? ? ? ?[name]  IDP:[id number] DOB: Mar 09, 1941 DOA: 09/21/2021 ?PCP: Pcp, No  ? ? ?Brief Narrative:  ?[name] is a 81 y.o. 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 Neurology was consulted. Now found with orthostatics Needs CIR. ? ? ? ?Consultants:  ?neurology ? ?Procedures:  ? ?Antimicrobials:  ?  ? ? ?Subjective: ?Report feeling her speech is improving, she denies any new focal deficit. ? ?Objective: ?Vitals:  ? 09/23/21 2348 09/24/21 4431 09/24/21 0748 09/24/21 0756  ?BP: (!) 161/73 (!) 182/65 (!) 143/74   ?Pulse: 61 60 72   ?Resp:   15   ?Temp: 98 ?F (36.7 ?C) 98 ?F (36.7 ?C) 98 ?F (36.7 ?C)   ?TempSrc: Oral Oral Oral   ?SpO2:  96% 95% 97%  ? ? ?Intake/Output Summary (Last 24 hours) at 09/24/2021 1209 ?Last data filed at 09/24/2021 1129 ?Gross per 24 hour  ?Intake 2250.74 ml  ?Output 700 ml  ?Net 1550.74 ml  ? ?There were no vitals filed for this visit. ? ?Examination: ?Awake Alert, Oriented X 3, she with expressive significant aphasia ?Symmetrical Chest wall movement, Good air movement bilaterally, CTAB ?RRR,No Gallops,Rubs or new Murmurs, No Parasternal Heave ?+ve B.Sounds, Abd Soft, No tenderness, No rebound - guarding or rigidity. ?No Cyanosis, Clubbing or edema, No new Rash or bruise   ? ? ? ? ?Data Reviewed: I have personally reviewed following labs and imaging studies ? ?CBC: ?Recent Labs  ?Lab 09/21/21 ?1325 09/22/21 ?0434  ?WBC 7.7 7.1  ?NEUTROABS 5.9  --   ?HGB 12.4 12.3  ?HCT 39.2 37.1  ?MCV 97.5 95.6  ?PLT 306 286  ? ?Basic Metabolic Panel: ?Recent Labs  ?Lab 09/21/21 ?1325 09/22/21 ?0434 09/24/21 ?0159  ?NA 140 135  --   ?K 4.2 4.0  --   ?CL 106 105  --   ?CO2 24 22  --   ?GLUCOSE 110* 91  --   ?BUN 21 15  --   ?CREATININE 1.33* 1.20* 1.16*  ?CALCIUM 9.6 9.3  --    ? ?GFR: ?CrCl cannot be calculated (Unknown ideal weight.). ?Liver Function Tests: ?No results for input(s): AST, ALT, ALKPHOS, BILITOT, PROT, ALBUMIN in the last 168 hours. ?No results for input(s): LIPASE, AMYLASE in the last 168 hours. ?No results for input(s): AMMONIA in the last 168 hours. ?Coagulation Profile: ?No results for input(s): INR, PROTIME in the last 168 hours. ?Cardiac Enzymes: ?No results for input(s): CKTOTAL, CKMB, CKMBINDEX, TROPONINI in the last 168 hours. ?BNP (last 3 results) ?No results for input(s): PROBNP in the last 8760 hours. ?HbA1C: ?Recent Labs  ?  09/22/21 ?0434  ?HGBA1C 5.2  ? ?CBG: ?No results for input(s): GLUCAP in the last 168 hours. ?Lipid Profile: ?Recent Labs  ?  09/22/21 ?0434  ?CHOL 151  ?HDL 53  ?[place] 69  ?TRIG 143  ?CHOLHDL 2.8  ? ?Thyroid Function Tests: ?No results for input(s): TSH, T4TOTAL, FREET4, T3FREE, THYROIDAB in the last 72 hours. ?Anemia Panel: ?No results for input(s): VITAMINB12, FOLATE, FERRITIN, TIBC, IRON, RETICCTPCT in the last 72 hours. ?Sepsis Labs: ?No results for input(s): PROCALCITON, LATICACIDVEN in the last 168 hours. ? ?Recent Results (from the past 240 hour(s))  ?Resp Panel by RT-PCR (Flu A&B, Covid) Nasopharyngeal Swab     Status: None  ? Collection Time: 09/21/21  2:10 PM  ?  Specimen: Nasopharyngeal Swab; Nasopharyngeal(NP) swabs in vial transport medium  ?Result Value Ref Range Status  ? SARS Coronavirus 2 by RT PCR NEGATIVE NEGATIVE Final  ?  Comment: (NOTE) ?SARS-CoV-2 target nucleic acids are NOT DETECTED. ? ?The SARS-CoV-2 RNA is generally detectable in upper respiratory ?specimens during the acute phase of infection. The lowest ?concentration of SARS-CoV-2 viral copies this assay can detect is ?138 copies/mL. A negative result does not preclude SARS-Cov-2 ?infection and should not be used as the sole basis for treatment or ?other patient management decisions. A negative result may occur with  ?improper specimen collection/handling,  submission of specimen other ?than nasopharyngeal swab, presence of viral mutation(s) within the ?areas targeted by this assay, and inadequate number of viral ?copies(<138 copies/mL). A negative result must be combined with ?clinical observations, patient history, and epidemiological ?information. The expected result is Negative. ? ?Fact Sheet for Patients:  ?EntrepreneurPulse.com.au ? ?Fact Sheet for Healthcare Providers:  ?IncredibleEmployment.be ? ?This test is no t yet approved or cleared by the [place] FDA and  ?has been authorized for detection and/or diagnosis of SARS-CoV-2 by ?FDA under an Emergency Use Authorization (EUA). This EUA will remain  ?in effect (meaning this test can be used) for the duration of the ?COVID-19 declaration under Section 564(b)(1) of the Act, 21 ?U.S.C.section 360bbb-3(b)(1), unless the authorization is terminated  ?or revoked sooner.  ? ? ?  ? Influenza A by PCR NEGATIVE NEGATIVE Final  ? Influenza B by PCR NEGATIVE NEGATIVE Final  ?  Comment: (NOTE) ?The Xpert Xpress SARS-CoV-2/FLU/RSV plus assay is intended as an aid ?in the diagnosis of influenza from Nasopharyngeal swab specimens and ?should not be used as a sole basis for treatment. Nasal washings and ?aspirates are unacceptable for Xpert Xpress SARS-CoV-2/FLU/RSV ?testing. ? ?Fact Sheet for Patients: ?EntrepreneurPulse.com.au ? ?Fact Sheet for Healthcare Providers: ?IncredibleEmployment.be ? ?This test is not yet approved or cleared by the [place] FDA and ?has been authorized for detection and/or diagnosis of SARS-CoV-2 by ?FDA under an Emergency Use Authorization (EUA). This EUA will remain ?in effect (meaning this test can be used) for the duration of the ?COVID-19 declaration under Section 564(b)(1) of the Act, 21 U.S.C. ?section 360bbb-3(b)(1), unless the authorization is terminated or ?revoked. ? ?Performed at [facility], [place] [address] [place], [place] ?[address] ?  ?  ? ? ? ? ? ?Radiology Studies: ?No results found. ? ? ? ? ? ?Scheduled Meds: ?  stroke: mapping our early stages of recovery book   Does not apply Once  ? aspirin  81 mg Oral Daily  ? clopidogrel  75 mg Oral Daily  ? dorzolamide-timolol  1 drop Both Eyes BID  ? enoxaparin (LOVENOX) injection  40 mg Subcutaneous Q24H  ? famotidine  20 mg Oral Daily  ? fenofibrate  160 mg Oral Daily  ? FLUoxetine  40 mg Oral Daily  ? gabapentin  100 mg Oral TID  ? mometasone-formoterol  2 puff Inhalation BID  ? ?Continuous Infusions: ? lactated ringers 50 mL/hr at 09/24/21 1129  ? ? ?Assessment & Plan: ?  ?Principal Problem: ?  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  Hypertension ?  Chronic kidney disease, stage 3b ([place]) ?  Depression with anxiety ?  Asthma ?  Fall at home, initial encounter ?  Right ankle pain ? ? ?Acute CVA ?- Presenting with persistent aphasia and mild right lower extremity weakness.  ?-CTA head/neck showed occlusion of distal left  MCA M2 branch and 70% stenosis of proximal right ICA ?- MRI with early subacute infarct within the left MCA territory involving the left parietal and temporal lobes.  Please see full report ?- Lower extremity venous Doppler negative for DVT ?- LDL goal less than 70 ?- A1C 5.2 ?- 3/21 echo normal EF.no IAS. ?- CTA 70% stenosis of prox. Rt ICA 2/2 predominantly calcified atherosclerosis. ?Neurology recommends 30-day CardioNet monitoring as outpatient to rule out A-fib prior to discharge ?- Continue aspirin 81 mg daily and Plavix 75 mg daily x3 months, then aspirin 81 mg alone thereafter ?PT OT recommend acute inpatient rehab ?Allow permissive hypertension gradually normalize in 5 to 7 days  ?Follow-up neurology as outpatient ? ?  ?Hypertension with orthostatics ?-Allow for permissive hypertension, on as needed hydralazine for systolic blood pressure> 638, will reintroduce her home medication gradually to  normalize blood pressure in 5 to 7 days ? ?Chronic kidney disease, stage 3b ([place]) ?Improving ?Started on ivf for gentle hydration ?Monitor level ? ? ?Fall at home ?Right ankle pain ?Fall at home presumed secondary to acute

## 2021-09-24 NOTE — Progress Notes (Signed)
Inpatient Rehabilitation Admission Medication Review by a Pharmacist ? ?A complete drug regimen review was completed for this patient to identify any potential clinically significant medication issues. ? ?High Risk Drug Classes Is patient taking? Indication by Medication  ?Antipsychotic No   ?Anticoagulant Yes Lovenox VTE ppx  ?Antibiotic No   ?Opioid No   ?Antiplatelet Yes Aspirin and plavix for stroke ppx  ?Hypoglycemics/insulin No   ?Vasoactive Medication Yes Losartan and amlodipine for blood pressure-currently holding  ?Chemotherapy No   ?Other Yes Prozac, Solifenacin (substitute w/darifenacin), pepcid, cosopt, fenofibrate, prozac, gabapentin, dulera (substitute for Symbicort)  ? ? ? ?Type of Medication Issue Identified Description of Issue Recommendation(s)  ?Drug Interaction(s) (clinically significant) ?    ?Duplicate Therapy ?    ?Allergy ?    ?No Medication Administration End Date ?    ?Incorrect Dose ?    ?Additional Drug Therapy Needed ?    ?Significant med changes from prior encounter (inform family/care partners about these prior to discharge).    ?Other ?    ? ? ?Clinically significant medication issues were identified that warrant physician communication and completion of prescribed/recommended actions by midnight of the next day:  No ? ?Provider Method of Notification:  ? ?Pharmacist comments:  ? ?Time spent performing this drug regimen review (minutes):  30 ? ? ?Thank you for allowing [place] to participate in this patients care. ?Jens Som, PharmD ?09/24/2021 4:27 PM ? ?**Pharmacist phone directory can be found on [email or web address] listed under [place]** ? ?

## 2021-09-24 NOTE — H&P (Signed)
? ? ? ? ?Physical Medicine and Rehabilitation Admission H&P ? ?  ?Chief Complaint  ?Patient presents with  ? Functional deficits secondary to   ? Stroke with aphasia  ? ? ?HPI: [name]. Larcom is an 81 year old female with history of Lung CA stage I, asthma, CKD, HTN who was admitted on 09/21/21 after fall and noted to have difficulty speaking.  CT head/neck without acute abnormality and chronic lacunar infarcts in left caudate head. She developed signs of right neglect  and CTA head/neck showed occlusion of distal L-MCA branch at posterior sylvian fissure and 70% stenosis of proximal R-ICA due to predominantly calcified atherosclerosis. She was out of window for [facility] and went on to develop sided weakness and RLE numbness. MRI brain showed L-MCA infarct affecting left parietal and temporal lobes. She was found to have mildly orthostatic changes. She was treated with fluid bolus and bed rest.  Dr. Barrington Ellison that stroke was likely due to intracranial stenosis but cardiac source not ruled out. He recommended DAPT X 3 months followed by ASA alone and CardioNet monitoring after discharge.  Renal status has improved with IVF for hydration. She has had issues with right ankle pain post fall and X rays were negative for fracture. 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 CIR recommended due to functional decline.    ? ? ?Review of Systems  ?Constitutional:  Negative for chills and fever.  ?HENT:  Negative for hearing loss and tinnitus.   ?Eyes:  Negative for blurred vision and double vision.  ?Respiratory:  Positive for cough. Negative for shortness of breath.   ?Cardiovascular:  Negative for chest pain.  ?Gastrointestinal:  Positive for heartburn. Negative for constipation and nausea.  ?Genitourinary:  Positive for frequency. Negative for dysuria.  ?Musculoskeletal:  Negative for myalgias.  ?Skin:   Negative for rash.  ?Neurological:  Positive for sensory change, speech change and weakness. Negative for dizziness and headaches.  ?Psychiatric/Behavioral:  The patient is not nervous/anxious and does not have insomnia.   ? ? ?Past Medical History:  ?Diagnosis Date  ?  Multiple pulmonary nodules, largest R mid lung 06/29/2013  ? Followed in Pulmonary clinic/ [facility]/ [place] - see CXR 06/27/13  - CT chest 07/14/2013 > 1. No acute findings in the thorax to account for the patient's  symptoms.  2. However, there is a subsolid nodule in the   right lower lobe that has a ground-glass attenuation component  measuring 2.5 x 1.8 cm, and a small central solid component  measuring 4 mm. Initial follow-up by chest CT without   ? Adenocarcinoma of lung, stage 1 ([place])   ? Status post right lower lobectomy August 2015  ? Anxiety   ? Asthma   ? Atrophic vaginitis   ? Chronic cough 12/20/2018  ? Chronic iritis of left eye   ? Pupil stays dilated; nonreactive  ? Chronic kidney disease, stage 3b ([place])   ? Colon polyp   ? Contact lens/glasses fitting   ? wears contacts or glasses  ? 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 Followed in Pulmonary clinic/  [facility]/ [place] [facility] in her 42's - Spirometry 02/23/13 wnl  - 07/03/2013 Sinus CT > Mild chronic sinus disease - No acute findings. Left-to-right nasal septal deviation of 3 mm. -med calendar 08/08/13 , redone 06/01/2016 and 02/22/2017  - Eos 4%   10/13/2013  > rec  singulair daily  - FENO 05/12/2016  =   24 on singulair  - Allergy profile 05/12/2016 >   IgE  47 pos  ? Depression   ? Depression with anxiety   ? Dysuria 08/03/2019  ? Environmental allergies   ? Essential hypertension 02/23/2017  ? Changed losartan to avapro 02/22/2017 due to cough   ? GERD (gastroesophageal reflux disease)   ? Hypertension   ? Hypertriglyceridemia   ? Kidney stones   ? OA (osteoarthritis)   ? Osteopenia   ? PONV (postoperative nausea and vomiting)   ? S/P lobectomy of lung  02/12/2014  ? 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 ? ?Past Surgical History:  ?Procedure Laterality Date  ? APPENDECTOMY    ? COLONOSCOPY    ? EYE SURGERY Left   ? cataract removal  ? ORIF WRIST FRACTURE Left 08/04/2013  ? Procedure: OPEN REDUCTION INTERNAL FIXATION (ORIF) LEFT DISTAL RADIUS WRIST FRACTURE;  Surgeon: Wynonia Sours, MD;  Location: [facility];  Service: Orthopedics;  Laterality: Left;  ? PARTIAL HYSTERECTOMY    ? TOTAL KNEE ARTHROPLASTY Left 08/26/2018  ? Procedure: TOTAL KNEE ARTHROPLASTY;  Surgeon: Netta Cedars, MD;  Location: [facility];  Service: Orthopedics;  Laterality: Left;  ? VIDEO ASSISTED THORACOSCOPY (VATS)/WEDGE RESECTION Right 02/12/2014  ? Procedure: RIGHT VIDEO ASSISTED THORACOSCOPY RIGHT LOWER LOBE LUNG /WEDGE RESECTION,RIGHT THORACOTOMY WITH RIGHT LOWER LOBE LOBECTOMY & NODE DISSECTION;  Surgeon: Melrose Nakayama, MD;  Location: [facility];  Service: Thoracic;  Laterality: Right;  ? VIDEO ASSISTED THORACOSCOPY (VATS)/WEDGE RESECTION Right 02/12/2014  ? VIDEO BRONCHOSCOPY N/A 02/12/2014  ? Procedure: VIDEO BRONCHOSCOPY;  Surgeon: Melrose Nakayama, MD;  Location: [facility];  Service: Thoracic;  Laterality: N/A;  ? ? ?Family History  ?Problem Relation Age of Onset  ? Stroke Father   ? Asthma Father   ? Heart disease Father   ?     CABG x 2  ? Lung cancer Sister   ?     smoked  ? Breast cancer Daughter 56  ? ? ?Social History:  Widowed. Lives alone with her 81 year old maltese. Independent and active PTA.  Daughter is speech therapist. She reports that she quit smoking about 43 years ago. Her smoking use included cigarettes. She has never used smokeless tobacco. No history on file for alcohol use and drug use. ? ? ?Allergies:  ?Allergies  ?Allergen Reactions  ? Grass Extracts [Gramineae Pollens] Other (See Comments)  ?  Unknown  ? Nsaids Other (See Comments)  ?  Elevated kidney fx  ? Prednisone Nausea And Vomiting and Other  (See Comments)  ?  Can take Prednisone injection but not orally  ? ? ? ?Medications Prior to Admission  ?Medication Sig Dispense Refill  ? acetaminophen (TYLENOL) 325 MG tablet Take 2 tablets (650 mg total) by mouth every 4 (four) hours as needed for mild pain (or temp > 37.5 C (99.5 F)).    ? albuterol (VENTOLIN HFA) 108 (90 Base) MCG/ACT inhaler TAKE 2 PUFFS BY MOUTH EVERY 6 HOURS AS NEEDED FOR WHEEZE OR SHORTNESS OF BREATH 6.7 each 5  ? [START ON 09/27/2021] amLODipine (NORVASC) 10 MG tablet Take 1 tablet (10 mg total) by mouth every morning.    ? amLODipine (NORVASC) 5 MG tablet Take 5 mg by mouth daily after breakfast.    ? Ascorbic Acid (VITAMIN C) 100 MG tablet Take 100 mg by mouth daily.    ?  aspirin EC 81 MG tablet Take 1 tablet (81 mg total) by mouth daily. Swallow whole. 30 tablet 3  ? BESIVANCE 0.6 % SUSP Place 1 drop into the right eye 4 (four) times daily. For 2 days after eye injection    ? budesonide-formoterol (SYMBICORT) 80-4.5 MCG/ACT inhaler INHALE 2 PUFFS BY MOUTH INTO THE LUNGS DAILY 30.6 g 3  ? Calcium-Magnesium-Vitamin D (CALCIUM 1200+D3 PO) Take 1 tablet by mouth daily.     ? chlorpheniramine (CHLOR-TRIMETON) 4 MG tablet Take 4 mg by mouth at bedtime.     ? Cholecalciferol (VITAMIN D3 PO) Take 1 capsule by mouth daily.    ? [START ON 09/25/2021] clopidogrel (PLAVIX) 75 MG tablet Take 1 tablet (75 mg total) by mouth daily. For total of 90 days    ? dorzolamide-timolol (COSOPT) 22.3-6.8 MG/ML ophthalmic solution Place 1 drop into both eyes 2 (two) times daily.    ? dorzolamide-timolol (COSOPT) 22.3-6.8 MG/ML ophthalmic solution Place 1 drop into both eyes 2 (two) times daily.    ? famotidine (PEPCID) 20 MG tablet Take 20 mg by mouth at bedtime.    ? famotidine (PEPCID) 20 MG tablet Take 20 mg by mouth daily.    ? fenofibrate 160 MG tablet Take 160 mg by mouth daily.    ? fenofibrate 160 MG tablet Take 160 mg by mouth daily.    ? FLUoxetine (PROZAC) 40 MG capsule Take 40 mg by mouth daily.     ?  FLUoxetine (PROZAC) 40 MG capsule Take 40 mg by mouth daily.    ? gabapentin (NEURONTIN) 100 MG capsule Take 1 capsule (100 mg total) by mouth 3 (three) times daily. One three times daily 270 capsule 1  ? gabap

## 2021-09-24 NOTE — Progress Notes (Signed)
Report given to Marvetta Gibbons, RN on [phone]. Patient notified of transport, family accompanied patient during transport. ?

## 2021-09-24 NOTE — Progress Notes (Signed)
Patient arrived from [facility], assigned to 4W08, [facility]. Patient appears alert and denies pain. ?

## 2021-09-24 NOTE — Plan of Care (Signed)
?  Problem: Education: ?Goal: Knowledge of General Education information will improve ?Description: Including pain rating scale, medication(s)/side effects and non-pharmacologic comfort measures ?Outcome: Progressing ?  ?Problem: Health Behavior/Discharge Planning: ?Goal: Ability to manage health-related needs will improve ?Outcome: Progressing ?  ?Problem: Clinical Measurements: ?Goal: Ability to maintain clinical measurements within normal limits will improve ?Outcome: Progressing ?Goal: Will remain free from infection ?Outcome: Progressing ?Goal: Diagnostic test results will improve ?Outcome: Progressing ?Goal: Respiratory complications will improve ?Outcome: Progressing ?Goal: Cardiovascular complication will be avoided ?Outcome: Progressing ?  ?Problem: Activity: ?Goal: Risk for activity intolerance will decrease ?Outcome: Progressing ?  ?Problem: Nutrition: ?Goal: Adequate nutrition will be maintained ?Outcome: Progressing ?  ?Problem: Coping: ?Goal: Level of anxiety will decrease ?Outcome: Progressing ?  ?Problem: Elimination: ?Goal: Will not experience complications related to bowel motility ?Outcome: Progressing ?Goal: Will not experience complications related to urinary retention ?Outcome: Progressing ?  ?Problem: Pain Managment: ?Goal: General experience of comfort will improve ?Outcome: Progressing ?  ?Problem: Safety: ?Goal: Ability to remain free from injury will improve ?Outcome: Progressing ?  ?Problem: Skin Integrity: ?Goal: Risk for impaired skin integrity will decrease ?Outcome: Progressing ?  ?Problem: Education: ?Goal: Knowledge of disease or condition will improve ?Outcome: Progressing ?Goal: Knowledge of secondary prevention will improve (SELECT ALL) ?Outcome: Progressing ?Goal: Knowledge of patient specific risk factors will improve (INDIVIDUALIZE FOR PATIENT) ?Outcome: Progressing ?Goal: Individualized Educational Video(s) ?Outcome: Progressing ?  ?Problem: Coping: ?Goal: Will verbalize  positive feelings about self ?Outcome: Progressing ?Goal: Will identify appropriate support needs ?Outcome: Progressing ?  ?Problem: Health Behavior/Discharge Planning: ?Goal: Ability to manage health-related needs will improve ?Outcome: Progressing ?  ?Problem: Self-Care: ?Goal: Ability to participate in self-care as condition permits will improve ?Outcome: Progressing ?Goal: Verbalization of feelings and concerns over difficulty with self-care will improve ?Outcome: Progressing ?Goal: Ability to communicate needs accurately will improve ?Outcome: Progressing ?  ?Problem: Nutrition: ?Goal: Risk of aspiration will decrease ?Outcome: Progressing ?Goal: Dietary intake will improve ?Outcome: Progressing ?  ?Problem: Intracerebral Hemorrhage Tissue Perfusion: ?Goal: Complications of Intracerebral Hemorrhage will be minimized ?Outcome: Progressing ?  ?Problem: Ischemic Stroke/TIA Tissue Perfusion: ?Goal: Complications of ischemic stroke/TIA will be minimized ?Outcome: Progressing ?  ?Problem: Spontaneous Subarachnoid Hemorrhage Tissue Perfusion: ?Goal: Complications of Spontaneous Subarachnoid Hemorrhage will be minimized ?Outcome: Progressing ?  ?

## 2021-09-24 NOTE — PMR Pre-admission (Signed)
PMR Admission Coordinator Pre-Admission Assessment ? ?Patient: [name] is an 81 y.o., female ?MRN: [id number] ?DOB: July 11, 1940 ?Height:   ?Weight:   ? ?Insurance Information ?HMO: yes     PPO:      PCP:      IPA:      80/20:      OTHER:  ?PRIMARY: UHC Medicare       Policy#: [id number]      Subscriber: pt.  ?CM Name:       Phone#: [phone]      Fax#: [phone] [place] with [employer] called 09/24/21 and gave approval 3/22-3/28 with updates due 3/28 ?Pre-Cert#: [id number]      Employer:  ?Benefits:  Phone #: portal     Name:  ?Irene Shipper Date: 07/06/2021 - still active ?Deductible: $0 (does not have deductible) ?OOP Max: $3,600 ($280 met) ?CIR: $295/day co-pay for days 1-5, $0/day co-pay for days 6+ ?SNF: $0.00 Copayment per day for days 1-20; $196.00 Copayment per day for days 21-39; $0.00 Copayment per day for days 40-100 for Medicare-covered care/maximum 100 days/benefit period ?Outpatient: $20/visit co-pay/visit ?Home Health:  100% coverage; limited by medical necessity ?DME: 80% coverage; 20% co-insurance ?Providers: in network ?SECONDARY: none       Policy#:      Phone#:  ? ? ? ?Financial Counselor:       Phone#:  ? ?The ?Data Collection Information Summary? for patients in Inpatient Rehabilitation Facilities with attached ?Privacy Act [place] Records? was provided and verbally reviewed with: Family ? ?Emergency Contact Information ?Contact Information   ? ? Name Relation Home Work Mobile  ? [name] Daughter   [phone]  ? ?  ? ? ?Current Medical History  ?Patient Admitting Diagnosis: CVA ?History of Present Illness: [name] is a 81 y.o. 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 In the ED, labs and vitals were as follows: Initial vitals showed BP 158/87, pulse 72,  RR 12, temp 97.7 ?F, SPO2 100% on room air. Labs show WBC 7.7, hemoglobin 12.4, platelets 306,000, sodium 140, potassium 4.2, bicarb 24, BUN 21, creatinine 1.33 (baseline 1.1-1.2), serum glucose 110, troponin 27x2. SARS-CoV-2 and influenza PCR negative.  Urinalysis negative for UTI. 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 CT cervical spine without contrast negative for acute cervical spine fracture or subluxation. Neurology was consulted and recommended starting on DAPT and admission for CVA work-up.  Neurology reviewed CT head and noted hypodensity posterior left MCA territory suggestive of acute infarct.  CTA head and neck obtained which showed occlusion of distal left MCA M2 branch at posterior sylvian fissure, 70% stenosis of proximal right ICA.  CT perfusion study pending due to processing error. MRI showed early subacute infarct of the left MCA territory involving the left parietal and temporal lobes. 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 ?Pt. Was admitted for stroke work up. PT/OT/SLP were consulted to assist in return to PLOF. ? ? ?Complete NIHSS TOTAL: 7 ? ?Patient's medical record from [facility] has been reviewed by the rehabilitation admission coordinator and physician. ? ?Past Medical History  ?Past Medical History:  ?Diagnosis Date  ?  Asthma   ? Chronic kidney disease, stage 3b ([place])   ? Depression with anxiety   ? Hypertension   ? ? ?Has the patient had major surgery during 100 days prior to admission? No ? ?Family History   ?family history includes Stroke in her father. ? ?Current Medications ? ?Current Facility-Administered Medications:  ?   stroke: mapping our early stages of recovery book, , Does not apply, Once, Emily Cordia, MD ?   acetaminophen (TYLENOL) tablet 650 mg, 650 mg, Oral, Q4H PRN, 650 mg at 09/22/21 2255 **OR** acetaminophen (TYLENOL) 160 MG/5ML solution 650 mg, 650 mg, Per Tube, Q4H PRN **OR** acetaminophen (TYLENOL) suppository 650 mg, 650 mg, Rectal, Q4H PRN, Posey Pronto, Vishal R, MD ?  albuterol (PROVENTIL) (2.5 MG/3ML) 0.083% nebulizer solution 2.5 mg, 2.5 mg, Inhalation, Q6H PRN, Zada Finders R, MD ?  aspirin chewable tablet 81 mg, 81 mg, Oral, Daily, Zada Finders R, MD, 81 mg at 09/24/21 0931 ?  clopidogrel (PLAVIX) tablet 75 mg, 75 mg, Oral, Daily, Zada Finders R, MD, 75 mg at 09/24/21 0931 ?  dorzolamide-timolol (COSOPT) 22.3-6.8 MG/ML ophthalmic solution 1 drop, 1 drop, Both Eyes, BID, Emily Cordia, MD, 1 drop at 09/24/21 0931 ?  enoxaparin (LOVENOX) injection 40 mg, 40 mg, Subcutaneous, Q24H, Zada Finders R, MD, 40 mg at 09/23/21 2031 ?  famotidine (PEPCID) tablet 20 mg, 20 mg, Oral, Daily, Zada Finders R, MD, 20 mg at 09/24/21 0931 ?  fenofibrate tablet 160 mg, 160 mg, Oral, Daily, Rosalin Hawking, MD, 160 mg at 09/24/21 0931 ?  FLUoxetine (PROZAC) capsule 40 mg, 40 mg, Oral, Daily, Zada Finders R, MD, 40 mg at 09/24/21 0931 ?  gabapentin (NEURONTIN) capsule 100 mg, 100 mg, Oral, TID, Zada Finders R, MD, 100 mg at 09/24/21 1607 ?  hydrALAZINE (APRESOLINE) injection 5 mg, 5 mg, Intravenous, Q6H PRN, Nolberto Hanlon, MD ?  lactated ringers infusion, , Intravenous, Continuous, Amery, Sahar, MD, Last Rate: 50 mL/hr at 09/24/21 1129, Infusion Verify at 09/24/21 1129 ?  mometasone-formoterol (DULERA) 100-5 MCG/ACT inhaler 2 puff, 2 puff, Inhalation, BID, Emily Cordia, MD, 2 puff at 09/24/21 0756 ?  senna-docusate (Senokot-S) tablet 1 tablet, 1 tablet, Oral, QHS PRN, Emily Cordia, MD ? ?Patients Current Diet:  ?Diet Order   ? ?       ?  Diet - low sodium heart healthy       ?  ?  Diet Heart Room service appropriate? Yes; Fluid consistency: Thin  Diet effective now       ?  ? ?  ?  ? ?  ? ? ?Precautions /  Restrictions ?Precautions ?Precautions: Fall ?Precaution Comments: monitor orthostatic BPs ?Restrictions ?Weight Bearing Restrictions: No  ? ?Has the patient had 2 or more falls or a fall with injury in the past year? No ? ?Prior Activity Level ?Community (5-7x/wk): Pt. was active in the community PTA ? ?Prior Functional Level ?Self Care: Did the patient need help bathing, dressing, using the toilet or eating? Independent ? ?Indoor Mobility: Did the patient need assistance with walking from room to room (with or without device)? Independent ? ?Stairs: Did the patient need assistance with internal or external stairs (with or without device)? Independent ? ?Functional Cognition: Did the patient need help planning regular tasks such as shopping or remembering to take medications? Needed some help ? ?Patient Information ?Are you of Hispanic, Latino/a,or Spanish origin?: A. No, not of Hispanic, Latino/a, or Spanish origin ?What is your race?: A. White ?  Do you need or want an interpreter to communicate with a doctor or health care staff?: 0. No ? ?Patient's Response To:  ?Health Literacy and Transportation ?Is the patient able to respond to health literacy and transportation needs?: Yes ?Health Literacy - How often do you need to have someone help you when you read instructions, pamphlets, or other written material from your doctor or pharmacy?: Never ?In the past 12 months, has lack of transportation kept you from medical appointments or from getting medications?: No ?In the past 12 months, has lack of transportation kept you from meetings, work, or from getting things needed for daily living?: No ? ?Home Assistive Devices / Equipment ?Home Assistive Devices/Equipment: None ?Home Equipment: Conservation officer, nature (2 wheels), [employer] - single point, Grab bars - toilet, Grab bars - tub/shower, Shower seat ? ?Prior Device Use: Indicate devices/aids used by the patient prior to current illness, exacerbation or injury? Walker ? ?Current  Functional Level ?Cognition ? Arousal/Alertness: Awake/alert ?Overall Cognitive Status: Impaired/Different from baseline ?Difficult to assess due to: Impaired communication (expressive aphasia) ?Orientation Level: Oriented to

## 2021-09-24 NOTE — Progress Notes (Signed)
Inpatient Rehab Admissions Coordinator:  ? ?I have a CIR bed for this Pt. Today. RN may call report to [phone]. ? ?Clemens Catholic, MS, CCC-SLP ?Rehab Admissions Coordinator  ?[phone] (celll) ?[phone] (office) ? ?

## 2021-09-24 NOTE — Progress Notes (Signed)
Physical Therapy Treatment ?Patient Details ?Name: [name] ?MRN: [id number] ?DOB: 1941-01-28 ?Today's Date: 09/24/2021 ? ? ?History of Present Illness Pt is a 81 yo female presenting to the ED 09/21/21 for R sided weakness and aphasia. 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 PMH includes HTN, CKD, and lung CA s/p R lower lobe lobectomy. ? ?  ?PT Comments  ? ? Patient's BP stable in standing and with ambulation today and denied dizziness. Able to progress to short distance ambulation with RW and min assist with mild imbalance in turns and tendency for RLE to drag behind her (improves with cues and her attending to RLE).  ?   ?Recommendations for follow up therapy are one component of a multi-disciplinary discharge planning process, led by the attending physician.  Recommendations may be updated based on patient status, additional functional criteria and insurance authorization. ? ?Follow Up Recommendations ? Acute inpatient rehab (3hours/day) ?  ?  ?Assistance Recommended at Discharge Frequent or constant Supervision/Assistance  ?Patient can return home with the following A little help with walking and/or transfers;A little help with bathing/dressing/bathroom;Assistance with cooking/housework;Help with stairs or ramp for entrance;Assist for transportation ?  ?Equipment Recommendations ? Rolling walker (2 wheels)  ?  ?Recommendations for Other Services   ? ? ?  ?Precautions / Restrictions Precautions ?Precautions: Fall ?Restrictions ?Weight Bearing Restrictions: No  ?  ? ?Mobility ? Bed Mobility ?Overal bed mobility: Needs Assistance ?Bed Mobility: Supine to Sit ?  ?  ?Supine to sit: HOB elevated, Supervision ?  ?  ?General bed mobility comments: incr time and effort ?  ? ?Transfers ?Overall transfer level: Needs assistance ?Equipment used: Rolling walker (2  wheels) ?Transfers: Sit to/from Stand ?Sit to Stand: Min assist ?  ?  ?  ?  ?  ?General transfer comment: vc for sequencing with RW; steadying assist as transitions hands up to RW ?  ? ?Ambulation/Gait ?Ambulation/Gait assistance: Min assist, +2 safety/equipment ?Gait Distance (Feet): 10 Feet (seated rest; 15) ?Assistive device: Rolling walker (2 wheels) ?Gait Pattern/deviations: Step-through pattern, Decreased step length - right, Drifts right/left ?Gait velocity: decr ?Gait velocity interpretation: <1.8 ft/sec, indicate of risk for recurrent falls ?  ?General Gait Details: RLE tending to fall behind and required cues to advance RLE first; imbalance during turning 180 degrees with RW on first attmept; improved on second 180 degree turn ? ? ?Stairs ?  ?  ?  ?  ?  ? ? ?Wheelchair Mobility ?  ? ?Modified Rankin (Stroke Patients Only) ?Modified Rankin (Stroke Patients Only) ?Pre-Morbid Rankin Score: No symptoms ?Modified Rankin: Moderately severe disability ? ? ?  ?Balance Overall balance assessment: Needs assistance ?Sitting-balance support: No upper extremity supported, Feet supported ?Sitting balance-Leahy Scale: Fair ?  ?  ?Standing balance support: Bilateral upper extremity supported ?Standing balance-Leahy Scale: Poor ?Standing balance comment: reliant on BUE support ?  ?  ?  ?  ?  ?  ?  ?  ?  ?  ?  ?  ? ?  ?Cognition Arousal/Alertness: Awake/alert ?Behavior During Therapy: [facility] for tasks assessed/performed ?Overall Cognitive Status: Difficult to assess ?  ?  ?  ?  ?  ?  ?  ?  ?  ?  ?  ?  ?  ?  ?  ?  ?  ?  ?  ? ?  ?Exercises   ? ?  ?  General Comments   ?  ?  ? ?Pertinent Vitals/Pain Pain Assessment ?Pain Assessment: No/denies pain  ? ? ?Home Living   ?  ?  ?  ?  ?  ?  ?  ?  ?  ?   ?  ?Prior Function    ?  ?  ?   ? ?PT Goals (current goals can now be found in the care plan section) Acute Rehab PT Goals ?Patient Stated Goal: to be independent ?Time For Goal Achievement: 10/06/21 ?Potential to Achieve Goals:  Good ?Progress towards PT goals: Progressing toward goals ? ?  ?Frequency ? ? ? Min 4X/week ? ? ? ?  ?PT Plan Current plan remains appropriate  ? ? ?Co-evaluation   ?  ?  ?  ?  ? ?  ?AM-PAC PT "6 Clicks" Mobility   ?Outcome Measure ? Help needed turning from your back to your side while in a flat bed without using bedrails?: A Little ?Help needed moving from lying on your back to sitting on the side of a flat bed without using bedrails?: A Little ?Help needed moving to and from a bed to a chair (including a wheelchair)?: A Little ?Help needed standing up from a chair using your arms (e.g., wheelchair or bedside chair)?: A Little ?Help needed to walk in hospital room?: Total ?Help needed climbing 3-5 steps with a railing? : Total ?6 Click Score: 14 ? ?  ?End of Session Equipment Utilized During Treatment: Gait belt ?Activity Tolerance: Patient tolerated treatment well ?Patient left: with call bell/phone within reach;in chair;with chair alarm set (on stretcher in ED) ?Nurse Communication: Mobility status ?PT Visit Diagnosis: Unsteadiness on feet (R26.81);Muscle weakness (generalized) (M62.81) ?  ? ? ?Time: 2409-7353 ?PT Time Calculation (min) (ACUTE ONLY): 24 min ? ?Charges:  $Gait Training: 23-37 mins          ?          ? ? ?[name], PT ?Acute Rehabilitation Services  ?Pager [phone] ?Office [phone] ? ? ? ?Jeanie Cooks Sarra Rachels ?09/24/2021, 9:40 AM ? ?

## 2021-09-24 NOTE — Progress Notes (Signed)
Inpatient Rehab Admissions Coordinator:  ? ?I spoke with Pt.'s daughter regarding potential CIR admit. She stated interest and indicated that family will work out 24/7 support at d/c if needed. Case has been submitted for insurance auth and I await a decision. ? ?Clemens Catholic, MS, CCC-SLP ?Rehab Admissions Coordinator  ?[phone] (celll) ?[phone] (office) ? ?

## 2021-09-24 NOTE — Progress Notes (Signed)
PMR Admission Coordinator Pre-Admission Assessment ?  ?Patient: [name] is an 81 y.o., female ?MRN: [id number] ?DOB: 09-03-1940 ?Height:   ?Weight:   ?  ?Insurance Information ?HMO: yes     PPO:      PCP:      IPA:      80/20:      OTHER:  ?PRIMARY: [employer] Medicare       Policy#: [id number]      Subscriber: pt.  ?CM Name:       Phone#: [phone]      Fax#: [phone] [place] with [employer] called 09/24/21 and gave approval 3/22-3/28 with updates due 3/28 ?Pre-Cert#: [id number]      Employer:  ?Benefits:  Phone #: portal     Name:  ?Irene Shipper Date: 07/06/2021 - still active ?Deductible: $0 (does not have deductible) ?OOP Max: $3,600 ($280 met) ?CIR: $295/day co-pay for days 1-5, $0/day co-pay for days 6+ ?SNF: $0.00 Copayment per day for days 1-20; $196.00 Copayment per day for days 21-39; $0.00 Copayment per day for days 40-100 for Medicare-covered care/maximum 100 days/benefit period ?Outpatient: $20/visit co-pay/visit ?Home Health:  100% coverage; limited by medical necessity ?DME: 80% coverage; 20% co-insurance ?Providers: in network ?SECONDARY: none       Policy#:      Phone#:  ?  ?  ?  ?Financial Counselor:       Phone#:  ?  ?The ?Data Collection Information Summary? for patients in Inpatient Rehabilitation Facilities with attached ?Privacy Act [place] Records? was provided and verbally reviewed with: Family ?  ?Emergency Contact Information ?Contact Information   ?  ?  Name Relation Home Work Mobile  ?  Lorenso Courier Daughter     [phone]  ?  ?   ?  ?  ?Current Medical History  ?Patient Admitting Diagnosis: CVA ?History of Present Illness: [name] is a 81 y.o. 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 In the ED, labs and vitals were as follows: Initial vitals showed BP  158/87, pulse 72, RR 12, temp 97.7 ?F, SPO2 100% on room air. Labs show WBC 7.7, hemoglobin 12.4, platelets 306,000, sodium 140, potassium 4.2, bicarb 24, BUN 21, creatinine 1.33 (baseline 1.1-1.2), serum glucose 110, troponin 27x2. SARS-CoV-2 and influenza PCR negative.  Urinalysis negative for UTI. 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 CT cervical spine without contrast negative for acute cervical spine fracture or subluxation. Neurology was consulted and recommended starting on DAPT and admission for CVA work-up.  Neurology reviewed CT head and noted hypodensity posterior left MCA territory suggestive of acute infarct.  CTA head and neck obtained which showed occlusion of distal left MCA M2 branch at posterior sylvian fissure, 70% stenosis of proximal right ICA.  CT perfusion study pending due to processing error. MRI showed early subacute infarct of the left MCA territory involving the left parietal and temporal lobes. 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 ?Pt. Was admitted for stroke work up. PT/OT/SLP were consulted to assist in return to PLOF. ?  ?  ?Complete NIHSS TOTAL: 7 ?  ?Patient's medical record from [facility] has been reviewed  by the rehabilitation admission coordinator and physician. ?  ?Past Medical History  ?    ?Past Medical History:  ?Diagnosis Date  ? Asthma    ? Chronic kidney disease, stage 3b ([place])    ? Depression with anxiety    ? Hypertension    ?  ?  ?Has the patient had major surgery during 100 days prior to admission? No ?  ?Family History   ?family history includes Stroke in her father. ?  ?Current Medications ?  ?Current Facility-Administered Medications:  ?   stroke: mapping our early stages of recovery book, , Does not apply,  Once, Lenore Cordia, MD ?  acetaminophen (TYLENOL) tablet 650 mg, 650 mg, Oral, Q4H PRN, 650 mg at 09/22/21 2255 **OR** acetaminophen (TYLENOL) 160 MG/5ML solution 650 mg, 650 mg, Per Tube, Q4H PRN **OR** acetaminophen (TYLENOL) suppository 650 mg, 650 mg, Rectal, Q4H PRN, Posey Pronto, Vishal R, MD ?  albuterol (PROVENTIL) (2.5 MG/3ML) 0.083% nebulizer solution 2.5 mg, 2.5 mg, Inhalation, Q6H PRN, Zada Finders R, MD ?  aspirin chewable tablet 81 mg, 81 mg, Oral, Daily, Zada Finders R, MD, 81 mg at 09/24/21 0931 ?  clopidogrel (PLAVIX) tablet 75 mg, 75 mg, Oral, Daily, Zada Finders R, MD, 75 mg at 09/24/21 0931 ?  dorzolamide-timolol (COSOPT) 22.3-6.8 MG/ML ophthalmic solution 1 drop, 1 drop, Both Eyes, BID, Lenore Cordia, MD, 1 drop at 09/24/21 0931 ?  enoxaparin (LOVENOX) injection 40 mg, 40 mg, Subcutaneous, Q24H, Zada Finders R, MD, 40 mg at 09/23/21 2031 ?  famotidine (PEPCID) tablet 20 mg, 20 mg, Oral, Daily, Zada Finders R, MD, 20 mg at 09/24/21 0931 ?  fenofibrate tablet 160 mg, 160 mg, Oral, Daily, Rosalin Hawking, MD, 160 mg at 09/24/21 0931 ?  FLUoxetine (PROZAC) capsule 40 mg, 40 mg, Oral, Daily, Zada Finders R, MD, 40 mg at 09/24/21 0931 ?  gabapentin (NEURONTIN) capsule 100 mg, 100 mg, Oral, TID, Zada Finders R, MD, 100 mg at 09/24/21 7628 ?  hydrALAZINE (APRESOLINE) injection 5 mg, 5 mg, Intravenous, Q6H PRN, Nolberto Hanlon, MD ?  lactated ringers infusion, , Intravenous, Continuous, Amery, Sahar, MD, Last Rate: 50 mL/hr at 09/24/21 1129, Infusion Verify at 09/24/21 1129 ?  mometasone-formoterol (DULERA) 100-5 MCG/ACT inhaler 2 puff, 2 puff, Inhalation, BID, Lenore Cordia, MD, 2 puff at 09/24/21 0756 ?  senna-docusate (Senokot-S) tablet 1 tablet, 1 tablet, Oral, QHS PRN, Lenore Cordia, MD ?  ?Patients Current Diet:  ?Diet Order   ?  ?         ?    Diet - low sodium heart healthy       ?  ?    Diet Heart Room service appropriate? Yes; Fluid consistency: Thin  Diet effective now       ?  ?  ?   ?  ?   ?   ?  ?  ?Precautions / Restrictions ?Precautions ?Precautions: Fall ?Precaution Comments: monitor orthostatic BPs ?Restrictions ?Weight Bearing Restrictions: No  ?  ?Has the patient had 2 or more falls or a fall with injury in the past year? No ?  ?Prior Activity Level ?Community (5-7x/wk): Pt. was active in the community PTA ?  ?Prior Functional Level ?Self Care: Did the patient need help bathing, dressing, using the toilet or eating? Independent ?  ?Indoor Mobility: Did the patient need assistance with walking from room to room (with or without device)? Independent ?  ?Stairs: Did the patient need assistance with internal or external  stairs (with or without device)? Independent ?  ?Functional Cognition: Did the patient need help planning regular tasks such as shopping or remembering to take medications? Needed some help ?  ?Patient Information ?Are you of Hispanic, Latino/a,or Spanish origin?: A. No, not of Hispanic, Latino/a, or Spanish origin ?What is your race?: A. White ?Do you need or want an interpreter to communicate with a doctor or health care staff?: 0. No ?  ?Patient's Response To:  ?Health Literacy and Transportation ?Is the patient able to respond to health literacy and transportation needs?: Yes ?Health Literacy - How often do you need to have someone help you when you read instructions, pamphlets, or other written material from your doctor or pharmacy?: Never ?In the past 12 months, has lack of transportation kept you from medical appointments or from getting medications?: No ?In the past 12 months, has lack of transportation kept you from meetings, work, or from getting things needed for daily living?: No ?  ?Home Assistive Devices / Equipment ?Home Assistive Devices/Equipment: None ?Home Equipment: Conservation officer, nature (2 wheels), [employer] - single point, Grab bars - toilet, Grab bars - tub/shower, Shower seat ?  ?Prior Device Use: Indicate devices/aids used by the patient prior to current illness,  exacerbation or injury? Walker ?  ?Current Functional Level ?Cognition ?  Arousal/Alertness: Awake/alert ?Overall Cognitive Status: Impaired/Different from baseline ?Difficult to assess due to: Impaired communi

## 2021-09-25 LAB — CBC WITH DIFFERENTIAL/PLATELET
Abs Immature Granulocytes: 0.02 10*3/uL (ref 0.00–0.07)
Basophils Absolute: 0 10*3/uL (ref 0.0–0.1)
Basophils Relative: 1 %
Eosinophils Absolute: 0.3 10*3/uL (ref 0.0–0.5)
Eosinophils Relative: 6 %
HCT: 34.3 % — ABNORMAL LOW (ref 36.0–46.0)
Hemoglobin: 11.7 g/dL — ABNORMAL LOW (ref 12.0–15.0)
Immature Granulocytes: 0 %
Lymphocytes Relative: 18 %
Lymphs Abs: 1 10*3/uL (ref 0.7–4.0)
MCH: 31.7 pg (ref 26.0–34.0)
MCHC: 34.1 g/dL (ref 30.0–36.0)
MCV: 93 fL (ref 80.0–100.0)
Monocytes Absolute: 0.6 10*3/uL (ref 0.1–1.0)
Monocytes Relative: 10 %
Neutro Abs: 3.8 10*3/uL (ref 1.7–7.7)
Neutrophils Relative %: 65 %
Platelets: 281 10*3/uL (ref 150–400)
RBC: 3.69 MIL/uL — ABNORMAL LOW (ref 3.87–5.11)
RDW: 13.4 % (ref 11.5–15.5)
WBC: 5.8 10*3/uL (ref 4.0–10.5)
nRBC: 0 % (ref 0.0–0.2)

## 2021-09-25 LAB — COMPREHENSIVE METABOLIC PANEL
ALT: 17 U/L (ref 0–44)
AST: 34 U/L (ref 15–41)
Albumin: 3.1 g/dL — ABNORMAL LOW (ref 3.5–5.0)
Alkaline Phosphatase: 35 U/L — ABNORMAL LOW (ref 38–126)
Anion gap: 6 (ref 5–15)
BUN: 17 mg/dL (ref 8–23)
CO2: 24 mmol/L (ref 22–32)
Calcium: 9.3 mg/dL (ref 8.9–10.3)
Chloride: 105 mmol/L (ref 98–111)
Creatinine, Ser: 1.17 mg/dL — ABNORMAL HIGH (ref 0.44–1.00)
GFR, Estimated: 47 mL/min — ABNORMAL LOW (ref >60)
Glucose, Bld: 95 mg/dL (ref 70–99)
Potassium: 3.8 mmol/L (ref 3.5–5.1)
Sodium: 135 mmol/L (ref 135–145)
Total Bilirubin: 0.8 mg/dL (ref 0.3–1.2)
Total Protein: 6 g/dL — ABNORMAL LOW (ref 6.5–8.1)

## 2021-09-25 LAB — VITAMIN D 25 HYDROXY (VIT D DEFICIENCY, FRACTURES): Vit D, 25-Hydroxy: 17.41 ng/mL — ABNORMAL LOW (ref 30–100)

## 2021-09-25 LAB — MAGNESIUM: Magnesium: 1.7 mg/dL (ref 1.7–2.4)

## 2021-09-25 MED ORDER — VITAMIN D (ERGOCALCIFEROL) 1.25 MG (50000 UNIT) PO CAPS
50000.0000 [IU] | ORAL_CAPSULE | ORAL | Status: DC
  Administered 2021-09-25 – 2021-10-02 (×2): 50000 [IU] via ORAL
  Filled 2021-09-25 (×2): qty 1

## 2021-09-25 MED ORDER — DICLOFENAC SODIUM 1 % EX GEL
2.0000 g | Freq: Four times a day (QID) | CUTANEOUS | Status: DC | PRN
  Administered 2021-09-30: 2 g via TOPICAL
  Filled 2021-09-25: qty 100

## 2021-09-25 MED ORDER — MAGNESIUM GLUCONATE 500 MG PO TABS
250.0000 mg | ORAL_TABLET | Freq: Every day | ORAL | Status: DC
  Administered 2021-09-25 – 2021-10-08 (×14): 250 mg via ORAL
  Filled 2021-09-25 (×14): qty 1

## 2021-09-25 NOTE — Evaluation (Signed)
Occupational Therapy Assessment and Plan ? ?Patient Details  ?Name: [name] ?MRN: [id number] ?Date of Birth: Nov 09, 1940 ? ?OT Diagnosis: ataxia, cognitive deficits, and hemiplegia affecting dominant side ?Rehab Potential: Rehab Potential (ACUTE ONLY): Excellent ?ELOS: 7-10 days  ? ?Today's Date: 09/25/2021 ?OT Individual Time: 1100-1200 ?OT Individual Time Calculation (min): 60 min    ? ?Hospital Problem: Principal Problem: ?  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 ? ?Past Medical History:  ?Past Medical History:  ?Diagnosis Date  ?  Multiple pulmonary nodules, largest R mid lung 06/29/2013  ? Followed in Pulmonary clinic/ [facility]/ [facility] - see CXR 06/27/13  - CT chest 07/14/2013 > 1. No acute findings in the thorax to account for the patient's  symptoms.  2. However, there is a subsolid nodule in the   right lower lobe that has a ground-glass attenuation component  measuring 2.5 x 1.8 cm, and a small central solid component  measuring 4 mm. Initial follow-up by chest CT without   ? Adenocarcinoma of lung, stage 1 ([place])   ? Status post right lower lobectomy August 2015  ? Anxiety   ? Asthma   ? Atrophic vaginitis   ? Chronic cough 12/20/2018  ? Chronic iritis of left eye   ? Pupil stays dilated; nonreactive  ? Chronic kidney disease, stage 3b ([place])   ? Colon polyp   ? Contact lens/glasses fitting   ? wears contacts or glasses  ? 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 Followed in Pulmonary clinic/  [facility] in her 77's - Spirometry 02/23/13 wnl  - 07/03/2013 Sinus CT > Mild chronic sinus disease - No acute findings. Left-to-right nasal septal deviation of 3 mm. -med calendar 08/08/13 , redone 06/01/2016 and 02/22/2017  - Eos 4%   10/13/2013  > rec singulair daily  - FENO 05/12/2016  =   24 on singulair  - Allergy profile 05/12/2016 >   IgE  47 pos  ? Depression   ? Depression with anxiety   ? Dysuria 08/03/2019  ? Environmental allergies   ?  Essential hypertension 02/23/2017  ? Changed losartan to avapro 02/22/2017 due to cough   ? GERD (gastroesophageal reflux disease)   ? Hypertension   ? Hypertriglyceridemia   ? Kidney stones   ? OA (osteoarthritis)   ? Osteopenia   ? PONV (postoperative nausea and vomiting)   ? S/P lobectomy of lung 02/12/2014  ? 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 ?Past Surgical History:  ?Past Surgical History:  ?Procedure Laterality Date  ? APPENDECTOMY    ? COLONOSCOPY    ? EYE SURGERY Left   ? cataract removal  ? ORIF WRIST FRACTURE Left 08/04/2013  ? Procedure: OPEN REDUCTION INTERNAL FIXATION (ORIF) LEFT DISTAL RADIUS WRIST FRACTURE;  Surgeon: Wynonia Sours, MD;  Location: [place];  Service: Orthopedics;  Laterality: Left;  ? PARTIAL HYSTERECTOMY    ? TOTAL KNEE ARTHROPLASTY Left 08/26/2018  ? Procedure: TOTAL KNEE ARTHROPLASTY;  Surgeon: Netta Cedars, MD;  [facility];  Service: Orthopedics;  Laterality: Left;  ? VIDEO ASSISTED THORACOSCOPY (VATS)/WEDGE RESECTION Right 02/12/2014  ? Procedure: RIGHT VIDEO ASSISTED THORACOSCOPY RIGHT LOWER LOBE LUNG /WEDGE RESECTION,RIGHT THORACOTOMY WITH RIGHT LOWER LOBE LOBECTOMY & NODE DISSECTION;  Surgeon: Melrose Nakayama, MD;  Location: [place];  Service: Thoracic;  Laterality: Right;  ? VIDEO ASSISTED THORACOSCOPY (VATS)/WEDGE RESECTION Right 02/12/2014  ?  VIDEO BRONCHOSCOPY N/A 02/12/2014  ? Procedure: VIDEO BRONCHOSCOPY;  Surgeon: Melrose Nakayama, MD;  Location: [place];  Service: Thoracic;  Laterality: N/A;  ? ? ?Assessment & Plan ?Clinical Impression: [name]. Kaylor is an 81 year old female with history of Lung CA stage I, asthma, CKD, HTN who was admitted on 09/21/21 after fall and noted to have difficulty speaking.  CT head/neck without acute abnormality and chronic lacunar infarcts in left caudate head. She developed signs of right neglect  and CTA head/neck showed occlusion of distal L-MCA  branch at posterior sylvian fissure and 70% stenosis of proximal R-ICA due to predominantly calcified atherosclerosis. She was out of window for [facility] and went on to develop sided weakness and RLE numbness. MRI brain showed L-MCA infarct affecting left parietal and temporal lobes. She was found to have mildly orthostatic changes. She was treated with fluid bolus and bed rest.  Dr. Barrington Ellison that stroke was likely due to intracranial stenosis but cardiac source not ruled out. He recommended DAPT X 3 months followed by ASA alone and CardioNet monitoring after discharge.  Renal status has improved with IVF for hydration. She has had issues with right ankle pain post fall and X rays were negative for fracture. 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 CIR recommended due to functional decline.    Patient transferred to CIR on 09/24/2021 .   ? ?Patient currently requires min with basic self-care skills secondary to muscle weakness, decreased cardiorespiratoy endurance, decreased coordination, decreased awareness, decreased problem solving, decreased safety awareness, and decreased memory, and decreased sitting balance, decreased standing balance, decreased postural control, hemiplegia, and decreased balance strategies.   ?Prior to hospitalization, patient was fully independent and living alone.  ? ?Patient will benefit from skilled intervention to increase independence with basic self-care skills prior to discharge home with care partner.  Anticipate patient will require 24 hour supervision and follow up home health. ? ?OT - End of Session ?Activity Tolerance: Tolerates 10 - 20 min activity with multiple rests ?Endurance Deficit: Yes ?OT Assessment ?Rehab Potential (ACUTE ONLY): Excellent ?OT Patient demonstrates impairments in the following area(s): Balance;Cognition;Endurance;Motor;Safety;Sensory ?OT  Basic ADL's Functional Problem(s): Bathing;Dressing;Toileting;Grooming;Eating ?OT Advanced ADL's Functional Problem(s): Light Housekeeping ?OT Transfers Functional Problem(s): Toilet;Tub/Shower ?OT Additional Impairment(s): None ?OT Plan ?OT Intensity: Minimum of 1-2 x/day, 45 to 90 minutes ?OT Frequency: 5 out of 7 days ?OT Duration/Estimated Length of Stay: 7-10 days ?OT Treatment/Interventions: Balance/vestibular training;Cognitive remediation/compensation;Discharge planning;DME/adaptive equipment instruction;Functional mobility training;Pain management;Patient/family education;Psychosocial support;Neuromuscular re-education;Self Care/advanced ADL retraining;UE/LE Strength taining/ROM;Therapeutic Exercise;Therapeutic Activities;UE/LE Coordination activities ?OT Self Feeding Anticipated Outcome(s): independent ?OT Basic Self-Care Anticipated Outcome(s): supervision ?OT Toileting Anticipated Outcome(s): supervision ?OT Bathroom Transfers Anticipated Outcome(s): supervision ?OT Recommendation ?Patient destination: Home ?Follow Up Recommendations: Home health OT ?Equipment Recommended: To be determined ? ? ?OT Evaluation ?Precautions/Restrictions  ?Precautions ?Precautions: Fall ?Precaution Comments: monitor orthostatic BPs ?Restrictions ?Weight Bearing Restrictions: No ? ?  ?Vital Signs:  sitting BP 173/94 ?Standing BP 139/89 ? ?Pain: no c/o pain  ?  ?Home Living/Prior Functioning ?Home Living ?Available Help at Discharge: Family, Available 24 hours/day ?Type of Home: House ?Home Access: Stairs to enter ?Entrance Stairs-Number of Steps: 1 ?Entrance Stairs-Rails: None ?Home Layout: One level ?Bathroom Shower/Tub: Walk-in shower ?Bathroom Toilet: Handicapped height ?Bathroom Accessibility: Yes ? Lives With: Alone ?Prior Function ?Level of Independence: Independent with basic ADLs, Independent with gait, Independent with homemaking with ambulation, Independent with transfers ?  Able to Take Stairs?: Yes ?Driving:  Yes ?Vocation: Retired ?Vocation Requirements: goes out to lunch with friends, has a small dog ?Vision ?Baseline Vision/History: 1 Wears glasses ?Ability to See in Adequate Light: 0 Adequate ?Patient Visual Report: No

## 2021-09-25 NOTE — Plan of Care (Signed)
?  Problem: RH Comprehension Communication ?Goal: LTG Patient will comprehend basic/complex auditory (SLP) ?Description: LTG: Patient will comprehend basic/complex auditory information with cues (SLP). ?Flowsheets (Taken 09/25/2021 1315) ?LTG: Patient will comprehend auditory information with cueing (SLP): Supervision ?  ?Problem: RH Expression Communication ?Goal: LTG Patient will express needs/wants via multi-modal(SLP) ?Description: LTG:  Patient will express needs/wants via multi-modal communication (gestures/written, etc) with cues (SLP) ?Flowsheets (Taken 09/25/2021 1315) ?LTG: Patient will express needs/wants via multimodal communication (gestures/written, etc) with cueing (SLP): Supervision ?Goal: LTG Patient will increase word finding of common (SLP) ?Description: LTG:  Patient will increase word finding of common objects/daily info/abstract thoughts with cues using compensatory strategies (SLP). ?Flowsheets (Taken 09/25/2021 1315) ?LTG: Patient will increase word finding of common (SLP): Supervision ?  ?Problem: RH Awareness ?Goal: LTG: Patient will demonstrate awareness during functional activites type of (SLP) ?Description: LTG: Patient will demonstrate awareness during functional activites type of (SLP) ?09/25/2021 1315 by Charna Elizabeth T, CCC-SLP ?Flowsheets (Taken 09/25/2021 1315) ?Patient will demonstrate during cognitive/linguistic activities awareness type of: ? Emergent ? Intellectual ?09/25/2021 1315 by Charna Elizabeth T, CCC-SLP ?Flowsheets (Taken 09/25/2021 1315) ?Patient will demonstrate during cognitive/linguistic activities awareness type of: ? Emergent ? Intellectual ?LTG: Patient will demonstrate awareness during cognitive/linguistic activities with assistance of (SLP): Supervision ?  ?

## 2021-09-25 NOTE — Progress Notes (Signed)
?                                                       PROGRESS NOTE ? ? ?Subjective/Complaints: ?Son at bedside ?Patient complains of some pain along dorsum of right foot- voltaren gel ordered ?Reviewed labs with her ? ?ROS: +right dorsal foot pain ? ?Objective: ?  ?No results found. ?Recent Labs  ?  09/25/21 ?0603  ?WBC 5.8  ?HGB 11.7*  ?HCT 34.3*  ?PLT 281  ? ?Recent Labs  ?  09/24/21 ?0159 09/25/21 ?0603  ?NA  --  135  ?K  --  3.8  ?CL  --  105  ?CO2  --  24  ?GLUCOSE  --  95  ?BUN  --  17  ?CREATININE 1.16* 1.17*  ?CALCIUM  --  9.3  ? ? ?Intake/Output Summary (Last 24 hours) at 09/25/2021 2219 ?Last data filed at 09/25/2021 1814 ?Gross per 24 hour  ?Intake 660 ml  ?Output --  ?Net 660 ml  ?  ? ?  ? ?Physical Exam: ?Vital Signs ?Blood pressure (!) 147/77, pulse (!) 59, temperature 97.7 ?F (36.5 ?C), temperature source Oral, resp. rate 18, height 5\' 2"  (1.575 m), weight 77.1 kg, SpO2 98 %. ? ?Gen: no distress, normal appearing ?HEENT: oral mucosa pink and moist, NCAT ?Cardio: Reg rate ?Chest: normal effort, normal rate of breathing ?Abd: soft, non-distended ?Ext: no edema ?Psych: pleasant, normal affect ?Skin: ?   Comments: Right forehead hematoma resolving. Right lateral ankle with resolving ecchymosis. Large bruise left forearm with skin tear that is healing.   ?Neurological:  ?   Mental Status: She is alert and oriented to person, place, and time.  ?   Comments: Expressive deficits noted with soft hesitant verbal output. Able to follow simple one and two step commands. Able to basic answer biographic questions with cues. RLE >RUE weakness with sensory deficits.   ? ?Assessment/Plan: ?1. Functional deficits which require 3+ hours per day of interdisciplinary therapy in a comprehensive inpatient rehab setting. ?Physiatrist is providing close team supervision and 24 hour management of active medical problems listed below. ?Physiatrist and rehab team continue to assess barriers to discharge/monitor patient  progress toward functional and medical goals ? ?Care Tool: ? ?Bathing ?   ?Body parts bathed by patient: Right arm, Left arm, Chest, Abdomen, Front perineal area, Buttocks, Right upper leg, Left upper leg, Right lower leg, Left lower leg, Face  ?   ?  ?  ?Bathing assist Assist Level: Contact Guard/Touching assist ?  ?  ?Upper Body Dressing/Undressing ?Upper body dressing   ?What is the patient wearing?: [place] only ?   ?Upper body assist Assist Level: Supervision/Verbal cueing ?   ?Lower Body Dressing/Undressing ?Lower body dressing ? ? ?   ?What is the patient wearing?: Incontinence brief ? ?  ? ?Lower body assist Assist for lower body dressing: Minimal Assistance - Patient > 75% ?   ? ?Toileting ?Toileting    ?Toileting assist Assist for toileting: Minimal Assistance - Patient > 75% ?  ?  ?Transfers ?Chair/bed transfer ? ?Transfers assist ?   ? ?Chair/bed transfer assist level: Moderate Assistance - Patient 50 - 74% ?  ?  ?Locomotion ?Ambulation ? ? ?Ambulation assist ? ?   ? ?Assist level: Minimal Assistance - Patient > 75% ?Assistive device:  Walker-rolling ?Max distance: 20  ? ?Walk 10 feet activity ? ? ?Assist ?   ? ?Assist level: Minimal Assistance - Patient > 75% ?Assistive device: Walker-rolling  ? ?Walk 50 feet activity ? ? ?Assist Walk 50 feet with 2 turns activity did not occur: Safety/medical concerns ? ?  ?   ? ? ?Walk 150 feet activity ? ? ?Assist Walk 150 feet activity did not occur: Safety/medical concerns ? ?  ?  ?  ? ?Walk 10 feet on uneven surface  ?activity ? ? ?Assist Walk 10 feet on uneven surfaces activity did not occur: Safety/medical concerns ? ? ?  ?   ? ?Wheelchair ? ? ? ? ?Assist Is the patient using a wheelchair?: Yes ?  ?  ? ?Wheelchair assist level: Moderate Assistance - Patient 50 - 74% ?Max wheelchair distance: 150  ? ? ?Wheelchair 50 feet with 2 turns activity ? ? ? ?Assist ? ?  ?  ? ? ?Assist Level: Moderate Assistance - Patient 50 - 74%  ? ?Wheelchair 150 feet activity   ? ? ? ?Assist ?   ? ? ?Assist Level: Moderate Assistance - Patient 50 - 74%  ? ?Blood pressure (!) 147/77, pulse (!) 59, temperature 97.7 ?F (36.5 ?C), temperature source Oral, resp. rate 18, height 5\' 2"  (1.575 m), weight 77.1 kg, SpO2 98 %. ? ?Medical Problem List and Plan: ?1. 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            -patient may shower ?            -ELOS/Goals:S 10-14 days ?            -Initial CIR evaluations today ?2.  Antithrombotics: ?-DVT/anticoagulation:  Pharmaceutical: Lovenox ?            -antiplatelet therapy: DAPT X 3 months followed by ASA alone.  ?3. Pain along dorsum of right foot: Tylenol prn. Add voltaren gel prn ?4. Mood: LCSW to follow for evaluation and support.  ?            -antipsychotic agents: N/A ?5. Neuropsych: This patient is not fully capable of making decisions on her own behalf. ?6. Bruising right arm: wrapped to prevent skin tears ?7. Fluids/Electrolytes/Nutrition: Monitor I/O. Check CMET in am.  ?8. HTN: Permissive hypertension.  ?--Monitor BP for orthostatic changes.  ?--Encourage fluid intake.  ?9. CKD: Baseline SCr- 1.33? Avoid nephrotoxic medications. Current creatinine is 1.16. Place nursing order to encourage 608 glasses of water per day ?10. Anxiety with depression: continue prozac. Check magnesium level tomorrow morning. ?11. Asthma/H/o Lung Ca-stage 1: Stable on Dulera. Continue regimen.  ?12. GERD: Used PPI intermittently at home.  ?--Daily Pepcid effective at this time.   ?13. Hyperactive bladder: Will resume Enablex for Vesicare (family to bring in home med) ?14. Glaucoma: On Cosopt.  ?15. Vitamin D deficiency: start ergocalciferol 50,000U once per week for 7 weeks.  ?  ? ?LOS: ?1 days ?A FACE TO FACE EVALUATION WAS PERFORMED ? ?Martha Clan P Kisha Messman ?09/25/2021, 10:19 PM  ? ?  ?

## 2021-09-25 NOTE — Progress Notes (Signed)
Inpatient Rehabilitation Care Coordinator ?Assessment and Plan ?Patient Details  ?Name: [name] ?MRN: [id number] ?Date of Birth: 01-28-1941 ? ?Today's Date: 09/25/2021 ? ?Hospital Problems: Principal Problem: ?  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 ?Past Medical History:  ?Past Medical History:  ?Diagnosis Date  ?  Multiple pulmonary nodules, largest R mid lung 06/29/2013  ? Followed in Pulmonary clinic/ [facility]/ [facility] - see CXR 06/27/13  - CT chest 07/14/2013 > 1. No acute findings in the thorax to account for the patient's  symptoms.  2. However, there is a subsolid nodule in the   right lower lobe that has a ground-glass attenuation component  measuring 2.5 x 1.8 cm, and a small central solid component  measuring 4 mm. Initial follow-up by chest CT without   ? Adenocarcinoma of lung, stage 1 ([place])   ? Status post right lower lobectomy August 2015  ? Anxiety   ? Asthma   ? Atrophic vaginitis   ? Chronic cough 12/20/2018  ? Chronic iritis of left eye   ? Pupil stays dilated; nonreactive  ? Chronic kidney disease, stage 3b ([place])   ? Colon polyp   ? Contact lens/glasses fitting   ? wears contacts or glasses  ? 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 Followed in Pulmonary clinic/ [facility]/ [facility] in her 43's - Spirometry 02/23/13 wnl  - 07/03/2013 Sinus CT > Mild chronic sinus disease - No acute findings. Left-to-right nasal septal deviation of 3 mm. -med calendar 08/08/13 , redone 06/01/2016 and 02/22/2017  - Eos 4%   10/13/2013  > rec singulair daily  - FENO 05/12/2016  =   24 on singulair  - Allergy profile 05/12/2016 >   IgE  47 pos  ? Depression   ? Depression with anxiety   ? Dysuria 08/03/2019  ? Environmental allergies   ? Essential hypertension 02/23/2017  ? Changed losartan to avapro 02/22/2017 due to cough   ? GERD (gastroesophageal reflux disease)   ? Hypertension   ? Hypertriglyceridemia   ? Kidney stones   ? OA (osteoarthritis)   ?  Osteopenia   ? PONV (postoperative nausea and vomiting)   ? S/P lobectomy of lung 02/12/2014  ? 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 ?Past Surgical History:  ?Past Surgical History:  ?Procedure Laterality Date  ? APPENDECTOMY    ? COLONOSCOPY    ? EYE SURGERY Left   ? cataract removal  ? ORIF WRIST FRACTURE Left 08/04/2013  ? Procedure: OPEN REDUCTION INTERNAL FIXATION (ORIF) LEFT DISTAL RADIUS WRIST FRACTURE;  Surgeon: Wynonia Sours, MD;  Location: [facility];  Service: Orthopedics;  Laterality: Left;  ? PARTIAL HYSTERECTOMY    ? TOTAL KNEE ARTHROPLASTY Left 08/26/2018  ? Procedure: TOTAL KNEE ARTHROPLASTY;  Surgeon: Netta Cedars, MD;  [facility];  Service: Orthopedics;  Laterality: Left;  ? VIDEO ASSISTED THORACOSCOPY (VATS)/WEDGE RESECTION Right 02/12/2014  ? Procedure: RIGHT VIDEO ASSISTED THORACOSCOPY RIGHT LOWER LOBE LUNG /WEDGE RESECTION,RIGHT THORACOTOMY WITH RIGHT LOWER LOBE LOBECTOMY & NODE DISSECTION;  Surgeon: Melrose Nakayama, MD;  Location: [place];  Service: Thoracic;  Laterality: Right;  ? VIDEO ASSISTED THORACOSCOPY (VATS)/WEDGE RESECTION Right 02/12/2014  ? VIDEO BRONCHOSCOPY N/A 02/12/2014  ? Procedure: VIDEO BRONCHOSCOPY;  Surgeon: Melrose Nakayama, MD;  Location: [place];  Service: Thoracic;  Laterality: N/A;  ? ?Social History:  reports that she quit smoking  about 43 years ago. Her smoking use included cigarettes. She has a 15.00 pack-year smoking history. She has never used smokeless tobacco. She reports current alcohol use of about 1.0 standard drink per week. She reports that she does not use drugs. ? ?Family / Support Systems ?Marital Status: Widow/Widower ?Patient Roles: Parent ?Children: Sonya-daughter [phone] [place] and son who is here from [place] ?Other Supports: neighbors and friends ?Anticipated Caregiver: Davy Pique ?Ability/Limitations of Caregiver: Daughter to take some time off but this will be short  term ?Caregiver Availability: 24/7 ?Family Dynamics: Close knit with son and daughter both unfortunantely live out of state. Son is here for a few days and daughter has been here also. Pt was very independent prior to this CVA ? ?Social History ?Preferred language: English ?Religion: Catholic ?Cultural Background: No issues ?Education: Some college ?Health Literacy - How often do you need to have someone help you when you read instructions, pamphlets, or other written material from your doctor or pharmacy?: Never ?Writes: Yes ?Employment Status: Retired ?Legal History/Current Legal Issues: No issues ?Guardian/Conservator: None-according to MD pt is not fully capable of making her own decisions while here, will look toward both of her children if any decision needs to be made while here  ? ?Abuse/Neglect ?Abuse/Neglect Assessment Can Be Completed: Yes ?Physical Abuse: Denies ?Verbal Abuse: Denies ?Sexual Abuse: Denies ?Exploitation of patient/patient's resources: Denies ?Self-Neglect: Denies ? ?Patient response to: ?Social Isolation - How often do you feel lonely or isolated from those around you?: Never ? ?Emotional Status ?Pt's affect, behavior and adjustment status: Pt has speech deficits, but is getting better with her responses. Son assisted with obtianing information. Pt was completely independent prior to admission and drove and cared for her dog-Lucy ?Recent Psychosocial Issues: other health issues but were managed ?Psychiatric History: History of depresion-anxiety takes medications for them and finds they help her. May benefit from seeing neuro-psych while here for coping. Doing well may be short length of stay ?Substance Abuse History: No issues ? ?Patient / Family Perceptions, Expectations & Goals ?Pt/Family understanding of illness & functional limitations: Pt and son can explain her stroke and deficits. She has sensory issues along with speech/language issues. Both have spoken with the MD along with her  daughter who is a speech therapist. All of them want information as it becomes available as to prevent another one from happening. 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 ?Community Resources ?Community Agencies: None ?Premorbid Home Care/DME Agencies: Other (Comment) (has cane, rw and shower seat) ?Transportation available at discharge: Pt drove, children drive but this may become an issue-may need to hire transport when children are back at home ?Is the patient able to respond to transportation needs?: Yes ?In the past 12 months, has lack of transportation kept you from medical appointments or from getting medications?: No ?In the past 12 months, has lack of transportation kept you from meetings, work, or from getting things needed for daily living?: No ?Resource referrals recommended: Neuropsychology ? ?Discharge Planning ?Living Arrangements: Alone ?Support Systems: Children, Friends/neighbors, Social worker community ?Type of Residence: Private residence ?Insurance Resources: Multimedia programmer (specify) [facility] Medicare) ?Financial Resources: Social Security ?Financial Screen Referred: No ?Living Expenses: Own ?Money Management: Patient ?Does the patient have any problems obtaining your medications?: No ?Home Management: self ?Patient/Family Preliminary  Plans: Return home with daughter taking some time off to provide assist. Made clear at first she will require 24/7 care due to speech issues and for safety. Gave son private duty agency list for hiring assist if needed. ?Care Coordinator Barriers to Discharge: Decreased caregiver support, Insurance for SNF coverage ?Care Coordinator Anticipated Follow Up Needs: HH/OP ? ?Clinical  Impression ?Pleasant female who is willing to work hard to recover from this stroke. Her two children son in [place] and daughter in [place] will assist but will need to plan for pt's care needs. Gave son private duty agency list and dis

## 2021-09-25 NOTE — Progress Notes (Signed)
Inpatient Rehabilitation  Patient information reviewed and entered into eRehab system by Taniesha Glanz M. Lular Letson, M.A., CCC/SLP, PPS Coordinator.  Information including medical coding, functional ability and quality indicators will be reviewed and updated through discharge.    

## 2021-09-25 NOTE — Progress Notes (Signed)
Inpatient Rehabilitation Center ?Individual Statement of Services ? ?Patient Name:  [name]  ?Date:  09/25/2021 ? ?Welcome to the [facility].  Our goal is to provide you with an individualized program based on your diagnosis and situation, designed to meet your specific needs.  With this comprehensive rehabilitation program, you will be expected to participate in at least 3 hours of rehabilitation therapies Monday-Friday, with modified therapy programming on the weekends. ? ?Your rehabilitation program will include the following services:  Physical Therapy (PT), Occupational Therapy (OT), Speech Therapy (ST), 24 hour per day rehabilitation nursing, Neuropsychology, Care Coordinator, Rehabilitation Medicine, Nutrition Services, and Pharmacy Services ? ?Weekly team conferences will be held on Tuesday to discuss your progress.  Your Inpatient Rehabilitation Care Coordinator will talk with you frequently to get your input and to update you on team discussions.  Team conferences with you and your family in attendance may also be held. ? ?Expected length of stay: 7-10 days  Overall anticipated outcome: supervision with cues ? ?Depending on your progress and recovery, your program may change. Your Inpatient Rehabilitation Care Coordinator will coordinate services and will keep you informed of any changes. Your Inpatient Rehabilitation Care Coordinator's name and contact numbers are listed  below. ? ?The following services may also be recommended but are not provided by the [facility]:  ?Driving Evaluations ?Home Health Rehabiltiation Services ?Outpatient Rehabilitation Services ? ?  ?Arrangements will be made to provide these services after discharge if needed.  Arrangements include referral to agencies that provide these services. ? ?Your insurance has been verified to be:  [employer]-Medicare ?Your primary doctor is:  Faustino Congress ? ?Pertinent information will be shared  with your doctor and your insurance company. ? ?Inpatient Rehabilitation Care Coordinator:  Ovidio Kin, [place] or (C) [phone] ? ?Information discussed with and copy given to patient by: Elease Hashimoto, 09/25/2021, 1:38 PM    ?

## 2021-09-25 NOTE — Evaluation (Signed)
Physical Therapy Assessment and Plan ? ?Patient Details  ?Name: [name] ?MRN: [id number] ?Date of Birth: 14-Apr-1941 ? ?PT Diagnosis: Abnormal posture, Abnormality of gait, Ataxic gait, Hemiplegia dominant, Impaired sensation, and Muscle weakness ?Rehab Potential: Good ?ELOS: 6-9 days  ? ?Today's Date: 09/25/2021 ?PT Individual Time: 0814-4818 ?PT Individual Time Calculation (min): 68 min   ? ?Hospital Problem: Principal Problem: ?  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 ? ?Past Medical History:  ?Past Medical History:  ?Diagnosis Date  ?  Multiple pulmonary nodules, largest R mid lung 06/29/2013  ? Followed in Pulmonary clinic/ [facility]/ [place] - see CXR 06/27/13  - CT chest 07/14/2013 > 1. No acute findings in the thorax to account for the patient's  symptoms.  2. However, there is a subsolid nodule in the   right lower lobe that has a ground-glass attenuation component  measuring 2.5 x 1.8 cm, and a small central solid component  measuring 4 mm. Initial follow-up by chest CT without   ? Adenocarcinoma of lung, stage 1 ([place])   ? Status post right lower lobectomy August 2015  ? Anxiety   ? Asthma   ? Atrophic vaginitis   ? Chronic cough 12/20/2018  ? Chronic iritis of left eye   ? Pupil stays dilated; nonreactive  ? Chronic kidney disease, stage 3b ([place])   ? Colon polyp   ? Contact lens/glasses fitting   ? wears contacts or glasses  ? 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 Followed in Pulmonary clinic/ [facility]/ [place] in her 45's - Spirometry 02/23/13 wnl  - 07/03/2013 Sinus CT > Mild chronic sinus disease - No acute findings. Left-to-right nasal septal deviation of 3 mm. -med calendar 08/08/13 , redone 06/01/2016 and 02/22/2017  - Eos 4%   10/13/2013  > rec singulair daily  - FENO 05/12/2016  =   24 on singulair  - Allergy profile 05/12/2016 >   IgE  47 pos  ? Depression   ? Depression with anxiety   ? Dysuria 08/03/2019  ? Environmental allergies   ?  Essential hypertension 02/23/2017  ? Changed losartan to avapro 02/22/2017 due to cough   ? GERD (gastroesophageal reflux disease)   ? Hypertension   ? Hypertriglyceridemia   ? Kidney stones   ? OA (osteoarthritis)   ? Osteopenia   ? PONV (postoperative nausea and vomiting)   ? S/P lobectomy of lung 02/12/2014  ? 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 ?Past Surgical History:  ?Past Surgical History:  ?Procedure Laterality Date  ? APPENDECTOMY    ? COLONOSCOPY    ? EYE SURGERY Left   ? cataract removal  ? ORIF WRIST FRACTURE Left 08/04/2013  ? Procedure: OPEN REDUCTION INTERNAL FIXATION (ORIF) LEFT DISTAL RADIUS WRIST FRACTURE;  Surgeon: Wynonia Sours, MD;  Location: [facility];  Service: Orthopedics;  Laterality: Left;  ? PARTIAL HYSTERECTOMY    ? TOTAL KNEE ARTHROPLASTY Left 08/26/2018  ? Procedure: TOTAL KNEE ARTHROPLASTY;  Surgeon: Netta Cedars, MD;  [facility]: [facility];  Service: Orthopedics;  Laterality: Left;  ? VIDEO ASSISTED THORACOSCOPY (VATS)/WEDGE RESECTION Right 02/12/2014  ? Procedure: RIGHT VIDEO ASSISTED THORACOSCOPY RIGHT LOWER LOBE LUNG /WEDGE RESECTION,RIGHT THORACOTOMY WITH RIGHT LOWER LOBE LOBECTOMY & NODE DISSECTION;  Surgeon: Melrose Nakayama, MD;  Location: [place];  Service: Thoracic;  Laterality: Right;  ? VIDEO ASSISTED THORACOSCOPY (VATS)/WEDGE RESECTION Right 02/12/2014  ?  VIDEO BRONCHOSCOPY N/A 02/12/2014  ? Procedure: VIDEO BRONCHOSCOPY;  Surgeon: Melrose Nakayama, MD;  Location: [place];  Service: Thoracic;  Laterality: N/A;  ? ? ?Assessment & Plan ?Clinical Impression: Patient is a 81 year old female with history of Lung CA stage I, asthma, CKD, HTN who was admitted on 09/21/21 after fall and noted to have difficulty speaking.  CT head/neck without acute abnormality and chronic lacunar infarcts in left caudate head. She developed signs of right neglect  and CTA head/neck showed occlusion of distal L-MCA branch at  posterior sylvian fissure and 70% stenosis of proximal R-ICA due to predominantly calcified atherosclerosis. She was out of window for [facility] and went on to develop sided weakness and RLE numbness. MRI brain showed L-MCA infarct affecting left parietal and temporal lobes. She was found to have mildly orthostatic changes. She was treated with fluid bolus and bed rest.  Dr. Barrington Ellison that stroke was likely due to intracranial stenosis but cardiac source not ruled out. He recommended DAPT X 3 months followed by ASA alone and CardioNet monitoring after discharge.  Renal status has improved with IVF for hydration. She has had issues with right ankle pain post fall and X rays were negative for fracture. 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   Patient transferred to CIR on 09/24/2021 .  ? ?Patient currently requires min to mod assist with mobility secondary to muscle weakness, impaired timing and sequencing and decreased coordination, and decreased sitting balance, decreased standing balance, decreased postural control, hemiplegia, and decreased balance strategies.  Prior to hospitalization, patient was modified independent  with mobility and lived with Alone in a House home.  Home access is 1Stairs to enter. ? ?Patient will benefit from skilled PT intervention to maximize safe functional mobility, minimize fall risk, and decrease caregiver burden for planned discharge home with 24 hour supervision.  Anticipate patient will benefit from follow up [place] at discharge. ? ?PT - End of Session ?Activity Tolerance: Tolerates < 10 min activity, no significant change in vital signs ?Endurance Deficit: Yes ?PT Assessment ?Rehab Potential (ACUTE/IP ONLY): Good ?PT Barriers to Discharge: Decreased caregiver support;Home environment access/layout;Wound Care;Lack of/limited family support;Insurance for SNF coverage ?PT  Patient demonstrates impairments in the following area(s): Balance;Edema;Endurance;Motor;Pain;Perception;Safety;Sensory;Skin Integrity ?PT Transfers Functional Problem(s): Bed Mobility;Bed to Chair;Car;Floor;Furniture ?PT Locomotion Functional Problem(s): Ambulation;Wheelchair Mobility;Stairs ?PT Plan ?PT Intensity: Minimum of 1-2 x/day ,45 to 90 minutes ?PT Frequency: 5 out of 7 days ?PT Duration Estimated Length of Stay: 6-9 days ?PT Treatment/Interventions: Ambulation/gait training;Discharge planning;Functional mobility training;Psychosocial support;Therapeutic Activities;Balance/vestibular training;Disease management/prevention;Neuromuscular re-education;Skin care/wound management;Therapeutic Exercise;Wheelchair propulsion/positioning;Cognitive remediation/compensation;Pain management;DME/adaptive equipment instruction;Splinting/orthotics;UE/LE Strength taining/ROM;Community reintegration;Functional electrical stimulation;Patient/family education;Stair training;UE/LE Coordination activities ?PT Transfers Anticipated Outcome(s): supervision assist with LRAD ?PT Locomotion Anticipated Outcome(s): Supevrision assist ambulatory at house hold level ?PT Recommendation ?Recommendations for Other Services: Therapeutic Recreation consult ?Therapeutic Recreation Interventions: Stress management;Outing/community reintergration ?Follow Up Recommendations: Home health PT ?Patient destination: Home ?Equipment Recommended: Rolling walker with 5" wheels;Wheelchair (measurements);Wheelchair cushion (measurements);To be determined ? ? ?PT Evaluation ?Precautions/Restrictions ?Restrictions ?Weight Bearing Restrictions: No ?General ?Chart Reviewed: Yes Vital Signs ?Pain ?Pain Assessment ?Pain Scale: 0-10 ?Pain Score: 5  ?Pain Type: Acute pain ?Pain Location: Foot ?Pain Orientation: Right ?Pain Descriptors / Indicators: Aching ?Pain Onset: On-going ?Patients Stated Pain Goal: 3 ?Pain Intervention(s): Medication (See  eMAR);Repositioned ?Pain Interference ?Pain Interference ?Pain Effect on Sleep: 4. Almost constantly ?Pain Interference with Therapy Activities: 2. Occasionally ?Pain Interference  with Day-to-Day Activities: 2. Occasionally

## 2021-09-25 NOTE — Evaluation (Signed)
Speech Language Pathology Assessment and Plan ? ?Patient Details  ?Name: [name] ?MRN: [id number] ?Date of Birth: 01/29/1941 ? ?SLP Diagnosis: Aphasia;Cognitive Impairments;Speech and Language deficits  ?Rehab Potential: Good ?ELOS: 7-10 days  ? ?Today's Date: 09/25/2021 ?SLP Individual Time: 0910-1010 ?SLP Individual Time Calculation (min): 60 min ? ?Hospital Problem: Principal Problem: ?  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 ?Past Medical History:  ?Past Medical History:  ?Diagnosis Date  ?  Multiple pulmonary nodules, largest R mid lung 06/29/2013  ? Followed in Pulmonary clinic/ [facility]/ [place] - see CXR 06/27/13  - CT chest 07/14/2013 > 1. No acute findings in the thorax to account for the patient's  symptoms.  2. However, there is a subsolid nodule in the   right lower lobe that has a ground-glass attenuation component  measuring 2.5 x 1.8 cm, and a small central solid component  measuring 4 mm. Initial follow-up by chest CT without   ? Adenocarcinoma of lung, stage 1 ([place])   ? Status post right lower lobectomy August 2015  ? Anxiety   ? Asthma   ? Atrophic vaginitis   ? Chronic cough 12/20/2018  ? Chronic iritis of left eye   ? Pupil stays dilated; nonreactive  ? Chronic kidney disease, stage 3b ([place])   ? Colon polyp   ? Contact lens/glasses fitting   ? wears contacts or glasses  ? 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 Followed in Pulmonary clinic/ [facility]/ [place] in her 31's - Spirometry 02/23/13 wnl  - 07/03/2013 Sinus CT > Mild chronic sinus disease - No acute findings. Left-to-right nasal septal deviation of 3 mm. -med calendar 08/08/13 , redone 06/01/2016 and 02/22/2017  - Eos 4%   10/13/2013  > rec singulair daily  - FENO 05/12/2016  =   24 on singulair  - Allergy profile 05/12/2016 >   IgE  47 pos  ? Depression   ? Depression with anxiety   ? Dysuria 08/03/2019  ? Environmental allergies   ? Essential hypertension 02/23/2017  ? Changed  losartan to avapro 02/22/2017 due to cough   ? GERD (gastroesophageal reflux disease)   ? Hypertension   ? Hypertriglyceridemia   ? Kidney stones   ? OA (osteoarthritis)   ? Osteopenia   ? PONV (postoperative nausea and vomiting)   ? S/P lobectomy of lung 02/12/2014  ? 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 ?Past Surgical History:  ?Past Surgical History:  ?Procedure Laterality Date  ? APPENDECTOMY    ? COLONOSCOPY    ? EYE SURGERY Left   ? cataract removal  ? ORIF WRIST FRACTURE Left 08/04/2013  ? Procedure: OPEN REDUCTION INTERNAL FIXATION (ORIF) LEFT DISTAL RADIUS WRIST FRACTURE;  Surgeon: Wynonia Sours, MD;  Location: [facility];  Service: Orthopedics;  Laterality: Left;  ? PARTIAL HYSTERECTOMY    ? TOTAL KNEE ARTHROPLASTY Left 08/26/2018  ? Procedure: TOTAL KNEE ARTHROPLASTY;  Surgeon: Netta Cedars, MD;  [facility];  Service: Orthopedics;  Laterality: Left;  ? VIDEO ASSISTED THORACOSCOPY (VATS)/WEDGE RESECTION Right 02/12/2014  ? Procedure: RIGHT VIDEO ASSISTED THORACOSCOPY RIGHT LOWER LOBE LUNG /WEDGE RESECTION,RIGHT THORACOTOMY WITH RIGHT LOWER LOBE LOBECTOMY & NODE DISSECTION;  Surgeon: Melrose Nakayama, MD;  Location: [facility];  Service: Thoracic;  Laterality: Right;  ? VIDEO ASSISTED THORACOSCOPY (VATS)/WEDGE RESECTION Right 02/12/2014  ? VIDEO BRONCHOSCOPY N/A 02/12/2014  ? Procedure: VIDEO  BRONCHOSCOPY;  Surgeon: Melrose Nakayama, MD;  Location: [facility] [place];  Service: Thoracic;  Laterality: N/A;  ? ? ?Assessment / Plan / Recommendation ?Clinical Impression [name] is an 81 year old female with history of Lung CA stage I, asthma, CKD, HTN who was admitted on 09/21/21 after fall and noted to have difficulty speaking.  CT head/neck without acute abnormality and chronic lacunar infarcts in left caudate head. She developed signs of right neglect  and CTA head/neck showed occlusion of distal L-MCA branch at posterior sylvian  fissure and 70% stenosis of proximal R-ICA due to predominantly calcified atherosclerosis. MRI brain showed L-MCA infarct affecting left parietal and temporal lobes. She has had issues with right ankle pain post fall and X rays were negative for fracture. 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 CIR recommended due to functional decline.    ? ?Patient presents with mild-to-moderate expressive aphasia and mild receptive aphasia. Spontaneous speech can be described as halting, paraphasic, and word finding difficulty at the sentence level impacting pt's ability to communicate functional needs. Pt achieved 80% accuracy with object naming, however consistent with hesitations and occasional semantic and phonemic paraphasias. She was stimulable for phonemic, written, and cloze phrase cues to repair communication breakdowns. Pt was able to verbally repeat at the word level; increased errors made at phrase and sentence level. Pt was [facility] for auditory verbal comprehension of basic and complex yes/no questions and for following basic 1 and 2 step directions; less successful with following sequential and complex commands. Pt responded well to written cues and was able to identify words and numbers in field of 3 with 100% accuracy. Further reading assessment unable to be completed due to time constraints. Pt wrote her first name with 75% accuracy, and was 90% successful when given whole word to copy. Pt exhibited particular difficulty with eliciting and writing numbers. Pt with overall good awareness of language errors and difficulty. Suspect decreased insight into physical deficits however, as she was unable to identify any physical impairments at this time. Provided patient with communication board and handout on aphasia and communication strategies. Patient would benefit from skilled SLP intervention to  maximize her expressive/receptive language and cognitive functioning and overall functional independence prior to discharge. Anticipate patient will benefit from follow up SLP services in home health or outpatient setting at next level of care. ?  ?Skilled Therapeutic Interventions          Pt participated in [facility] ([facility]) as well as further non-standardized assessments of cognitive-linguistic, speech, and language function. Please see above.    ?  ?SLP Assessment ? Patient will need skilled Speech Lanaguage Pathology Services during CIR admission  ?  ?Recommendations ? Patient destination: Home ?Follow up Recommendations: 24 hour supervision/assistance;Home Health SLP ?Equipment Recommended: None recommended by SLP  ?  ?SLP Frequency 3 to 5 out of 7 days   ?SLP Duration ? ?SLP Intensity ? ?SLP Treatment/Interventions 7-10 days ? ?Minumum of 1-2 x/day, 30 to 90 minutes ? ?Cognitive remediation/compensation;Cueing hierarchy;Multimodal communication approach;Speech/Language facilitation;Functional tasks;Patient/family education;Therapeutic Activities   ? ?Pain ?  ? ?Prior Functioning ?Cognitive/Linguistic Baseline: Within functional limits ?Type of Home: House ? Lives With: Alone ?Available Help at Discharge: Family;Available 24 hours/day ?Vocation: Retired ? ?SLP Evaluation ?Cognition ?Overall Cognitive Status: Impaired/Different from baseline ?Arousal/Alertness: Awake/alert ?Orientation Level: Oriented X4 ?Year: 2023 ?Month: March ?Day of Week: Correct ?Selective Attention: Appears intact ?Memory: Impaired ?  Memory Impairment: Storage deficit;Retrieval deficit;Decreased recall of new information ?Awareness: Impaired ?Awareness Impairment: Intellectual impairment ?Safety/Judgment: Impaired  ?Comprehension ?Auditory Comprehension ?Overall Auditory Comprehension: Impaired ?Yes/No Questions: Within Functional Limits ?Commands: Impaired ?One Step Basic Commands: 75-100% accurate ?Two Step Basic  Commands: 75-100% accurate ?Multistep Basic Commands: 75-100% accurate ?Complex Commands: 50-74% accurate ?Conversation: Simple ?EffectiveTechniques: Extra processing time ?Reading Comprehension ?Reading Stat

## 2021-09-25 NOTE — Progress Notes (Signed)
Patient ID: [name], female   DOB: 1941-05-07, 81 y.o.   MRN: [id number] ?Met with the patient to introduce self, rehab process, team conference and plan of care. Discussed DAPT x 3 months per MD then ASA solo. Also review dietary modification recommendations, HH diet and pain control for right ankle injury. Continue to follow along to discharge to address educational needs to facilitate preparation for discharge. Discussed access to My Chart, appears patient has already set up a My chart account.  [name] ? ?

## 2021-09-26 MED ORDER — AMLODIPINE BESYLATE 5 MG PO TABS
5.0000 mg | ORAL_TABLET | Freq: Every day | ORAL | Status: DC
  Administered 2021-09-26 – 2021-10-02 (×7): 5 mg via ORAL
  Filled 2021-09-26 (×7): qty 1

## 2021-09-26 NOTE — Progress Notes (Signed)
?                                                       PROGRESS NOTE ? ? ?Subjective/Complaints: ?Son and daughter visiting today ?No new complaints ?Still with some pain over right dorsal foot- has not asked for voltaren gel. ? ?ROS: +right dorsal foot pain, +insomnia ? ?Objective: ?  ?No results found. ?Recent Labs  ?  09/25/21 ?0603  ?WBC 5.8  ?HGB 11.7*  ?HCT 34.3*  ?PLT 281  ? ?Recent Labs  ?  09/24/21 ?0159 09/25/21 ?0603  ?NA  --  135  ?K  --  3.8  ?CL  --  105  ?CO2  --  24  ?GLUCOSE  --  95  ?BUN  --  17  ?CREATININE 1.16* 1.17*  ?CALCIUM  --  9.3  ? ? ?Intake/Output Summary (Last 24 hours) at 09/26/2021 1553 ?Last data filed at 09/26/2021 1255 ?Gross per 24 hour  ?Intake 517 ml  ?Output --  ?Net 517 ml  ?  ? ?  ? ?Physical Exam: ?Vital Signs ?Blood pressure (!) 149/76, pulse 66, temperature 98 ?F (36.7 ?C), temperature source Oral, resp. rate 16, height 5\' 2"  (1.575 m), weight 77.1 kg, SpO2 96 %. ? ?Gen: no distress, normal appearing ?HEENT: oral mucosa pink and moist, NCAT ?Cardio: Reg rate ?Chest: normal effort, normal rate of breathing ?Abd: soft, non-distended ?Ext: no edema ?Skin: ?   Comments: Right forehead hematoma resolving. Right lateral ankle with resolving ecchymosis. Large bruise left forearm with skin tear that is healing.   ?Neurological:  ?   Mental Status: She is alert and oriented to person, place, and time.  ?   Comments: Expressive deficits noted with soft hesitant verbal output. Able to follow simple one and two step commands. Able to basic answer biographic questions with cues. RLE >RUE weakness with sensory deficits.   ? ?Assessment/Plan: ?1. Functional deficits which require 3+ hours per day of interdisciplinary therapy in a comprehensive inpatient rehab setting. ?Physiatrist is providing close team supervision and 24 hour management of active medical problems listed below. ?Physiatrist and rehab team continue to assess barriers to discharge/monitor patient progress toward  functional and medical goals ? ?Care Tool: ? ?Bathing ?   ?Body parts bathed by patient: Right arm, Left arm, Chest, Abdomen, Front perineal area, Buttocks, Right upper leg, Left upper leg, Right lower leg, Left lower leg, Face  ?   ?  ?  ?Bathing assist Assist Level: Contact Guard/Touching assist ?  ?  ?Upper Body Dressing/Undressing ?Upper body dressing   ?What is the patient wearing?: [place] only ?   ?Upper body assist Assist Level: Supervision/Verbal cueing ?   ?Lower Body Dressing/Undressing ?Lower body dressing ? ? ?   ?What is the patient wearing?: Incontinence brief ? ?  ? ?Lower body assist Assist for lower body dressing: Minimal Assistance - Patient > 75% ?   ? ?Toileting ?Toileting    ?Toileting assist Assist for toileting: Minimal Assistance - Patient > 75% ?  ?  ?Transfers ?Chair/bed transfer ? ?Transfers assist ?   ? ?Chair/bed transfer assist level: Moderate Assistance - Patient 50 - 74% ?  ?  ?Locomotion ?Ambulation ? ? ?Ambulation assist ? ?   ? ?Assist level: Minimal Assistance - Patient > 75% ?Assistive device: Walker-rolling ?Max  distance: 20  ? ?Walk 10 feet activity ? ? ?Assist ?   ? ?Assist level: Minimal Assistance - Patient > 75% ?Assistive device: Walker-rolling  ? ?Walk 50 feet activity ? ? ?Assist Walk 50 feet with 2 turns activity did not occur: Safety/medical concerns ? ?  ?   ? ? ?Walk 150 feet activity ? ? ?Assist Walk 150 feet activity did not occur: Safety/medical concerns ? ?  ?  ?  ? ?Walk 10 feet on uneven surface  ?activity ? ? ?Assist Walk 10 feet on uneven surfaces activity did not occur: Safety/medical concerns ? ? ?  ?   ? ?Wheelchair ? ? ? ? ?Assist Is the patient using a wheelchair?: Yes ?  ?  ? ?Wheelchair assist level: Moderate Assistance - Patient 50 - 74% ?Max wheelchair distance: 150  ? ? ?Wheelchair 50 feet with 2 turns activity ? ? ? ?Assist ? ?  ?  ? ? ?Assist Level: Moderate Assistance - Patient 50 - 74%  ? ?Wheelchair 150 feet activity  ? ? ? ?Assist ?    ? ? ?Assist Level: Moderate Assistance - Patient 50 - 74%  ? ?Blood pressure (!) 149/76, pulse 66, temperature 98 ?F (36.7 ?C), temperature source Oral, resp. rate 16, height 5\' 2"  (1.575 m), weight 77.1 kg, SpO2 96 %. ? ?Medical Problem List and Plan: ?1. 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            -patient may shower ?            -ELOS/Goals:S 10-14 days ?            Continue CIR ?2.  Antithrombotics: ?-DVT/anticoagulation:  Pharmaceutical: Lovenox ?            -antiplatelet therapy: DAPT X 3 months followed by ASA alone.  ?3. Pain along dorsum of right foot: Tylenol prn. Add voltaren gel prn ?4. Mood: LCSW to follow for evaluation and support.  ?            -antipsychotic agents: N/A ?5. Neuropsych: This patient is not fully capable of making decisions on her own behalf. ?6. Bruising right arm: wrapped to prevent skin tears ?7. Fluids/Electrolytes/Nutrition: Monitor I/O.  ?8. HTN: Permissive hypertension first 7 days and then start normalizing.  ?--Encourage fluid intake.  ?-start amlodipine 5mg  daily ?9. CKD: Baseline SCr- 1.33? Avoid nephrotoxic medications. Current creatinine is 1.16. Place nursing order to encourage 6-8 glasses of water per day. Discussed herbal teas that can help improve kidney function with patient and her son.  ?10. Anxiety with depression: continue prozac.  ?11. Asthma/H/o Lung Ca-stage 1: Stable on Dulera. Continue regimen.  ?12. GERD: Used PPI intermittently at home.  ?--Daily Pepcid effective at this time.   ?13. Hyperactive bladder: Will resume Enablex for Vesicare (family to bring in home med) ?14. Glaucoma: On Cosopt.  ?15. Vitamin D deficiency: start ergocalciferol 50,000U once per week for 7 weeks.  ?16. Insomnia: no longer with orthostasis- discontinue 5am orthostatic vital signs.  ?17. Suboptimal magnesium level: 250mg  magnesium gluconate started HS.  ?  ? ?LOS: ?2 days ?A FACE TO FACE EVALUATION WAS PERFORMED ? ?Martha Clan P Eveny Anastas ?09/26/2021, 3:53 PM  ? ?  ?

## 2021-09-26 NOTE — Plan of Care (Signed)
?  Problem: Consults ?Goal: RH STROKE PATIENT EDUCATION ?Description: See Patient Education module for education specifics  ?Outcome: Progressing ?  ?Problem: RH BLADDER ELIMINATION ?Goal: RH STG MANAGE BLADDER WITH ASSISTANCE ?Description: STG Manage Bladder With toileting Assistance ?Outcome: Progressing ?Goal: RH STG MANAGE BLADDER WITH MEDICATION WITH ASSISTANCE ?Description: STG Manage Bladder With Medication With mod I  Assistance. ?Outcome: Progressing ?  ?Problem: RH SAFETY ?Goal: RH STG ADHERE TO SAFETY PRECAUTIONS W/ASSISTANCE/DEVICE ?Description: STG Adhere to Safety Precautions With cues Assistance/Device. ?Outcome: Progressing ?  ?Problem: RH PAIN MANAGEMENT ?Goal: RH STG PAIN MANAGED AT OR BELOW PT'S PAIN GOAL ?Description: At or below level 4 with prns ?Outcome: Progressing ?  ?Problem: RH KNOWLEDGE DEFICIT ?Goal: RH STG INCREASE KNOWLEDGE OF HYPERTENSION ?Description: Patient and family will be able to manage HTN with medications and dietary modifications using handouts and educational resources independently ?Outcome: Progressing ?Goal: RH STG INCREASE KNOWLEDGE OF STROKE PROPHYLAXIS ?Description: Patient and family will be able to manage secondary stroke risks with medications and dietary modifications using handouts and educational resources independently ?Outcome: Progressing ?  ?

## 2021-09-26 NOTE — Progress Notes (Signed)
Physical Therapy Session Note ? ?Patient Details  ?Name: [name] ?MRN: [id number] ?Date of Birth: 03/28/1941 ? ?Today's Date: 09/26/2021 ?PT Individual Time: 1062-6948 ?PT Individual Time Calculation (min): 55 min  ? ?Short Term Goals: ?Week 1:  PT Short Term Goal 1 (Week 1): STG=LTG due to ELOS ? ?Skilled Therapeutic Interventions/Progress Updates:  ?  Patient received supine in bed, agreeable to PT. She denies pain. Patient reporting fatigue from yesterdays therapy sessions in the PM. Patient requesting to have all therapy in the AM. PT to pass this along. Patient coming to sit edge of bed with supervision. Donning shirt with set up A + supervision, donning underwear and pants with MinA. Patient standing with MinA to finish pulling up pants. Standing at sink with CGA to brush teeth. PT transporting patient in wc to therapy gym for time management and energy conservation. She ambulated 61ft with RW and CGA, 2.5# ankle weight to R LE. Slight R inattention noted with patient having poor awareness of hand malpositioning on RW. PT applying [place] to walker handle to assist with this. Patient completing R LE toe tap to 4" step with 2.5# ankle weight + B UE support on RW, 3x10. Patient with report of feeling light headed after static standing for ~2 mins- inability to capture BP since no vitals machine present. Symptoms resolved upon patient sitting. She ambulated x133ft partway back to her room with RW, no ankle weight and CGA. Mod verbal and tactile cues for erect posture, walker proximity and increased R foot clearance. Patient electing to return to bed. Bed alarm on, call light within reach.  ? ? ?Therapy Documentation ?Precautions:  ?Precautions ?Precautions: Fall ?Precaution Comments: monitor orthostatic BPs ?Restrictions ?Weight Bearing Restrictions: No ? ? ? ?Therapy/Group: Individual Therapy ? ?Debbora Dus ?Debbora Dus, PT, DPT, CBIS ? ?09/26/2021, 7:37 AM  ?

## 2021-09-26 NOTE — Progress Notes (Signed)
Speech Language Pathology Daily Session Note ? ?Patient Details  ?Name: [name] ?MRN: [id number] ?Date of Birth: 1940-12-30 ? ?Today's Date: 09/26/2021 ?SLP Individual Time: 0211-1552 ?SLP Individual Time Calculation (min): 70 min ? ?Short Term Goals: ?Week 1: SLP Short Term Goal 1 (Week 1): STG=LTG due to ELOS ? ?Skilled Therapeutic Interventions: Skilled ST treatment focused on cognitive-communication goals. SLP facilitated session by providing sup A cues for intellectual awareness of physical impairments. Pt demonstrated heightened awareness of physical deficits following PT today and highlighted the challenges she experiences with right leg impacting mobility and balance. SLP also facilitated session by providing min-to-mod A for implementing semantic feature analysis to describe common objects and places. Verbal fluency can continue to be described as intermittent halting, extended time for word retrieval, intermittent phonemic and semantic paraphasias, and decreased sentence formulation, particularly with adjectives and prepositions. Pt displayed good awareness of verbal errors and overcame many communication breakdowns with additional processing time and phonemic approximations. Patient completed divergent naming with overall min A verbal cues, and generated simple sentences with target nouns with sup A for accuracy. Patient was left in bed with alarm activated and immediate needs within reach at end of session. Continue per current plan of care.   ?   ? ?Pain ?Pain Assessment ?Pain Scale: 0-10 ?Pain Score: 0-No pain ? ?Therapy/Group: Individual Therapy ? ?Lani Mendiola T Jaiden Dinkins ?09/26/2021, 2:39 PM ?

## 2021-09-26 NOTE — Progress Notes (Signed)
Occupational Therapy Session Note ? ?Patient Details  ?Name: [name] ?MRN: [id number] ?Date of Birth: July 20, 1940 ? ?Today's Date: 09/26/2021 ?OT Individual Time: 5374-8270 ?OT Individual Time Calculation (min): 30 min  ? ? ?Short Term Goals: ?Week 1:  OT Short Term Goal 1 (Week 1): STGs = LTGs ? ?Skilled Therapeutic Interventions/Progress Updates:  ?  Pt received supine with no c/o pain. Pt very distractible, requiring mod cueing for sustained attention to task. She completed ambulatory transfer to the bathroom with the RW with CGA. She completed toileting tasks, voiding urine, with CGA overall. She returned to the recliner with CGA overall. Discussed home set up with her daughter and she shared pictures. Printed off handwriting worksheets for pt to work on in the room to address RUE dexterity. Pt was left sitting up with her daughter supervising.  ? ?Therapy Documentation ?Precautions:  ?Precautions ?Precautions: Fall ?Precaution Comments: monitor orthostatic BPs ?Restrictions ?Weight Bearing Restrictions: No ? ? ?Therapy/Group: Individual Therapy ? ?Curtis Sites ?09/26/2021, 6:40 AM ?

## 2021-09-27 DIAGNOSIS — N3281 Overactive bladder: Secondary | ICD-10-CM

## 2021-09-27 DIAGNOSIS — I10 Essential (primary) hypertension: Secondary | ICD-10-CM

## 2021-09-27 NOTE — Progress Notes (Signed)
Speech Language Pathology Daily Session Note ? ?Patient Details  ?Name: [name] ?MRN: [id number] ?Date of Birth: [birth year] ? ?Today's Date: 09/27/2021 ?SLP Individual Time: 2229-7989 ?SLP Individual Time Calculation (min): 59 min ? ?Short Term Goals: ?Week 1: SLP Short Term Goal 1 (Week 1): STG=LTG due to ELOS ? ?Skilled Therapeutic Interventions: ?Pt seen for skilled ST with focus on cognitive communication goals, pt up in wheelchair and pleasantly cooperative with all therapeutic tasks. Throughout session pt demonstrating good awareness of verbal errors and word finding challenges, able to compensate by pausing and allowing extra time for responses. Pt and SLP engaging in functional communication task with patient's topic of choice, speech initially with only occasional halting and paraphasias however these became more frequent toward end of hour long session likely due to fatigue. Pt also participated in structured language tasks focused on mildly abstract category naming benefiting from min A verbal cues for accuracy. Pt left in wheelchair with alarm belt activated and all needs within reach. Cont ST POC.  ? ?Pain ?Pain Assessment ?Pain Scale: 0-10 ?Pain Score: 0-No pain ? ?Therapy/Group: Individual Therapy ? ?[name] ?09/27/2021, 12:27 PM ?

## 2021-09-27 NOTE — Progress Notes (Signed)
Occupational Therapy Session Note ? ?Patient Details  ?Name: [name] ?MRN: [id number] ?Date of Birth: 10-16-1940 ? ?Today's Date: 09/27/2021 ?OT Individual Time: 8677-3736 ?OT Individual Time Calculation (min): 55 min  ? ? ?Short Term Goals: ?Week 1:  OT Short Term Goal 1 (Week 1): STGs = LTGs ? ?Skilled Therapeutic Interventions/Progress Updates: Pt received standing at sink for oral care with CGA for balance with NT present. Cued pt to turn off water and she needed mod cues to find R handle to fully turn the water off.  She seemed to have more difficulty finding items on her R side of sink, but pt also did not have her glasses on. ?Had pt sit on bed to rest, put on her glasses and discussed the plan for the session today. Pt transferred with RW to wc to move to ortho gym.  ?From sitting in wc, pt worked on [employer] for [employer] scanning, finding targets with a stylus. Her response time was slower at 2-3 seconds but she was able to find all targets in her R visual field.  ? ?Stand pivot wc to mat to sit unsupported for BUE AROM and LE AROM focusing on R knee ext and ankle mobility.  Coordination of crossing and uncrossing ankles quickly and and alternating kicks from mat ("as if she was splashing in a pool") ? ?RUE [place] with resistive clothespins. Minimally difficulty at first with motor planning how to open pins, but then was able to figure them out. ? ?BUE strength with 3# dowel bar with simple shoulder AROM with a rowing exercise.  ? ?Worked on verbal expression as she talked about where she was from and what she had planned to do this summer. Able to express self with some word finding difficulty. ? ?Sit to stands from mat with improved balance control.  ? ?Stand pivot to w/c CGA. Pt transported back to room.  ? ?Set up in w/c with belt alarm on and all needs met.   ? ? ? ?Therapy Documentation ?Precautions:  ?Precautions ?Precautions: Fall ?Precaution Comments: monitor orthostatic BPs ?Restrictions ?Weight  Bearing Restrictions: No ?Oxygen Therapy ?SpO2: 97 % ?O2 Device: Room Air ?Pain: ? No c/o pain ?ADL: ?ADL ?Eating: Set up ?Grooming: Supervision/safety ?Where Assessed-Grooming: Sitting at sink ?Upper Body Bathing: Supervision/safety ?Where Assessed-Upper Body Bathing: Sitting at sink ?Lower Body Bathing: Contact guard ?Where Assessed-Lower Body Bathing: Sitting at sink, Standing at sink ?Upper Body Dressing: Supervision/safety ?Where Assessed-Upper Body Dressing: Sitting at sink ?Lower Body Dressing: Minimal assistance ?Where Assessed-Lower Body Dressing: Sitting at sink, Standing at sink ?Toileting: Minimal assistance ?Where Assessed-Toileting: Toilet ?Toilet Transfer: Minimal assistance ?Toilet Transfer Method: Stand pivot ?Science writer: Grab bars ? ? ?Therapy/Group: Individual Therapy ? ?[place] ?09/27/2021, 10:22 AM ?

## 2021-09-27 NOTE — Progress Notes (Signed)
Physical Therapy Session Note ? ?Patient Details  ?Name: [name] ?MRN: [id number] ?Date of Birth: 1941/06/11 ? ?Today's Date: 09/27/2021 ?PT Individual Time: 1300-1400 ?PT Individual Time Calculation (min): 60 min  ? ?Short Term Goals: ?Week 1:  PT Short Term Goal 1 (Week 1): STG=LTG due to ELOS ? ?Skilled Therapeutic Interventions/Progress Updates: Pt presents supine in bed and agreeable to therapy.  Pt transferred sup to sit w/ supervision and scooted to EOB.  Pt amb x 4' w/ RW and min A to w/c.  Pt wheeled to main gym for time and energy conservation.  Pt performed multiple sit to stand transfers w/ mkn to CGA.  Pt able to self-correct for correct hand placement for transfers.  Pt performed toe-taps to 6" platform w/ HHA and then x 3 w/ RW.  2.5# weight to R LE utilized.  Pt requires verbal cues for hip/knee flex vs "circumduction" to lift RLE onto platform.  Pt performed 30 sec STS test w/o UE assist at 6 reps.  Pt amb multiple trials w/ RW and min to CGA w/o weight to RLE up to 60' before seated rest break.  Pt amb x 60' into room and to bed.  Pt able to transfer sit to supine w/ supervision and then bridge to center.  Bed alarm on and all needs in reach. ?   ? ?Therapy Documentation ?Precautions:  ?Precautions ?Precautions: Fall ?Precaution Comments: monitor orthostatic BPs ?Restrictions ?Weight Bearing Restrictions: No ?General: ?  ?Vital Signs: ?Therapy Vitals ?Temp: 97.8 ?F (36.6 ?C) ?Temp Source: Oral ?Pulse Rate: 66 ?Resp: 16 ?BP: (!) 144/68 ?Patient Position (if appropriate): Sitting ?Oxygen Therapy ?SpO2: 98 % ?O2 Device: Room Air ?Pain:0/10 ?Pain Assessment ?Pain Scale: 0-10 ?Pain Score: 0-No pain ?Mobility: ?  ? ? ? ?Therapy/Group: Individual Therapy ? ?[name] ?09/27/2021, 2:03 PM  ?

## 2021-09-27 NOTE — IPOC Note (Signed)
Overall Plan of Care (IPOC) ?Patient Details ?Name: [name] ?MRN: [id number] ?DOB: January 03, 1941 ? 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 ?Hospital Problems: Principal Problem: ?  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 ? ? ? Functional Problem List: ?Nursing Medication Management, Safety, Endurance, Pain, Bladder  ?PT Balance, Edema, Endurance, Motor, Pain, Perception, Safety, Sensory, Skin Integrity  ?OT Balance, Cognition, Endurance, Motor, Safety, Sensory  ?SLP Cognition, Linguistic, Safety  ?TR    ?    ? Basic ADL?s: ?OT Bathing, Dressing, Toileting, Grooming, Eating  ? ?  Advanced  ADL?s: ?OT Light Housekeeping  ?   ?Transfers: ?PT Bed Mobility, Bed to Chair, Car, Floor, Furniture  ?OT Toilet, Tub/Shower  ? ?  Locomotion: ?PT Ambulation, Wheelchair Mobility, Stairs  ? ?  Additional Impairments: ?OT None  ?SLP Communication ?expression, comprehension ?   ?TR    ? ? ?Anticipated Outcomes ?Item Anticipated Outcome  ?Self Feeding independent  ?Swallowing ?   ?  ?Basic self-care ? supervision  ?Toileting ? supervision ?  ?Bathroom Transfers supervision  ?Bowel/Bladder ? manage bladder w toileting  ?Transfers ? supervision assist with LRAD  ?Locomotion ? [place] assist ambulatory at house hold level  ?Communication ? sup A  ?Cognition ? sup A  ?Pain ? pain at or below level 4 with prns  ?Safety/Judgment ? maintain safety w cues  ? ?Therapy Plan: ?PT Intensity: Minimum of 1-2 x/day ,45 to 90 minutes ?PT Frequency: 5 out of 7 days ?PT Duration Estimated Length of Stay: 6-9 days ?OT Intensity: Minimum of 1-2 x/day, 45 to 90 minutes ?OT Frequency: 5 out of 7 days ?OT Duration/Estimated Length of Stay: 7-10 days ?SLP Intensity: Minumum of 1-2 x/day, 30 to 90 minutes ?SLP Frequency: 3 to 5 out of 7 days ?SLP Duration/Estimated Length of Stay: 7-10 days  ? ?Due to the current state of emergency, patients may not be receiving their 3-hours of  Medicare-mandated therapy. ? ? Team Interventions: ?Nursing Interventions Bladder Management, Disease Management/Prevention, Medication Management, Discharge Planning, Pain Management, Patient/Family Education  ?PT interventions Ambulation/gait training, Discharge planning, Functional mobility training, Psychosocial support, Therapeutic Activities, Balance/vestibular training, Disease management/prevention, Neuromuscular re-education, Skin care/wound management, Therapeutic Exercise, Wheelchair propulsion/positioning, Cognitive remediation/compensation, Pain management, DME/adaptive equipment instruction, Splinting/orthotics, UE/LE Strength taining/ROM, Community reintegration, Functional electrical stimulation, Patient/family education, Stair training, UE/LE Coordination activities  ?OT Interventions Balance/vestibular training, Cognitive remediation/compensation, Discharge planning, DME/adaptive equipment instruction, Functional mobility training, Pain management, Patient/family education, Psychosocial support, Neuromuscular re-education, Self Care/advanced ADL retraining, UE/LE Strength taining/ROM, Therapeutic Exercise, Therapeutic Activities, UE/LE Coordination activities  ?SLP Interventions Cognitive remediation/compensation, Cueing hierarchy, Multimodal communication approach, Speech/Language facilitation, Functional tasks, Patient/family education, Therapeutic Activities  ?TR Interventions    ?SW/CM Interventions Discharge Planning, Psychosocial Support, Patient/Family Education  ? ?Barriers to Discharge ?MD  Medical stability  ?Nursing Decreased caregiver support, Home environment access/layout ?1 level 1 ste no rails solo PTA; daughter to assist after work and hire caregivers to assist  ?PT Decreased caregiver support, Home environment access/layout, Wound Care, Lack of/limited family support, Insurance for SNF coverage ?   ?OT   ?   ?SLP   ?   ?SW Decreased caregiver support, Insurance for SNF coverage ?    ? ?Team Discharge Planning: ?Destination: PT-Home ,OT- Home , SLP-Home ?Projected Follow-up: PT-Home health PT, OT-  Home health OT, SLP-24 hour supervision/assistance, Home Health SLP ?Projected Equipment Needs: PT-Rolling walker with 5" wheels, Wheelchair (measurements), Wheelchair cushion (measurements), To be determined, OT- To be  determined, SLP-None recommended by SLP ?Equipment Details: PT- , OT-  ?Patient/family involved in discharge planning: PT- Patient,  OT-Patient, Family member/caregiver, SLP-Patient, Family member/caregiver ? ?MD ELOS: 10-14 days ?Medical Rehab Prognosis:  Excellent ?Assessment: The patient has been admitted for CIR therapies with the diagnosis of left MCA CVA. The team will be addressing functional mobility, strength, stamina, balance, safety, adaptive techniques and equipment, self-care, bowel and bladder mgt, patient and caregiver education. Goals have been set at supervision. Anticipated discharge destination is home. ? ? ?See Team Conference Notes for weekly updates to the plan of care  ?

## 2021-09-27 NOTE — Progress Notes (Signed)
?                                                       PROGRESS NOTE ? ? ?Subjective/Complaints: ?Pt up in BR. No new complaints.  ? ?ROS: Patient denies fever, rash, sore throat, blurred vision, dizziness, nausea, vomiting, diarrhea, cough, shortness of breath or chest pain, joint or back/neck pain, headache, or mood change.  ? ? ? ?Objective: ?  ?No results found. ?Recent Labs  ?  09/25/21 ?0603  ?WBC 5.8  ?HGB 11.7*  ?HCT 34.3*  ?PLT 281  ? ?Recent Labs  ?  09/25/21 ?0603  ?NA 135  ?K 3.8  ?CL 105  ?CO2 24  ?GLUCOSE 95  ?BUN 17  ?CREATININE 1.17*  ?CALCIUM 9.3  ? ? ?Intake/Output Summary (Last 24 hours) at 09/27/2021 1045 ?Last data filed at 09/27/2021 0749 ?Gross per 24 hour  ?Intake 754 ml  ?Output --  ?Net 754 ml  ?  ? ?  ? ?Physical Exam: ?Vital Signs ?Blood pressure (!) 130/91, pulse 66, temperature 98.1 ?F (36.7 ?C), temperature source Oral, resp. rate 16, height 5\' 2"  (1.575 m), weight 77.1 kg, SpO2 97 %. ? ?Constitutional: No distress . Vital signs reviewed. ?HEENT: NCAT, EOMI, oral membranes moist ?Neck: supple ?Cardiovascular: RRR without murmur. No JVD    ?Respiratory/Chest: CTA Bilaterally without wheezes or rales. Normal effort    ?GI/Abdomen: BS +, non-tender, non-distended ?Ext: no clubbing, cyanosis, or edema ?Psych: pleasant and cooperative  ?Skin: ?   Comments: Right forehead hematoma resolving. Right lateral ankle with resolving ecchymosis. Large bruise left forearm with skin tear that is healing--stable 3/25.   ?Neurological:  ?   Mental Status: She is alert and oriented to person, place, and time.  ?   Comments: follows basic commands. Word finding deficits.  Able to basic answer biographic questions with cues. RLE >RUE weakness with sensory deficits.   ? ?Assessment/Plan: ?1. Functional deficits which require 3+ hours per day of interdisciplinary therapy in a comprehensive inpatient rehab setting. ?Physiatrist is providing close team supervision and 24 hour management of active medical  problems listed below. ?Physiatrist and rehab team continue to assess barriers to discharge/monitor patient progress toward functional and medical goals ? ?Care Tool: ? ?Bathing ?   ?Body parts bathed by patient: Right arm, Left arm, Chest, Abdomen, Front perineal area, Buttocks, Right upper leg, Left upper leg, Right lower leg, Left lower leg, Face  ?   ?  ?  ?Bathing assist Assist Level: Contact Guard/Touching assist ?  ?  ?Upper Body Dressing/Undressing ?Upper body dressing   ?What is the patient wearing?: [place] only ?   ?Upper body assist Assist Level: Supervision/Verbal cueing ?   ?Lower Body Dressing/Undressing ?Lower body dressing ? ? ?   ?What is the patient wearing?: Incontinence brief ? ?  ? ?Lower body assist Assist for lower body dressing: Minimal Assistance - Patient > 75% ?   ? ?Toileting ?Toileting    ?Toileting assist Assist for toileting: Minimal Assistance - Patient > 75% ?  ?  ?Transfers ?Chair/bed transfer ? ?Transfers assist ?   ? ?Chair/bed transfer assist level: Moderate Assistance - Patient 50 - 74% ?  ?  ?Locomotion ?Ambulation ? ? ?Ambulation assist ? ?   ? ?Assist level: Minimal Assistance - Patient > 75% ?Assistive device:  Walker-rolling ?Max distance: 20  ? ?Walk 10 feet activity ? ? ?Assist ?   ? ?Assist level: Minimal Assistance - Patient > 75% ?Assistive device: Walker-rolling  ? ?Walk 50 feet activity ? ? ?Assist Walk 50 feet with 2 turns activity did not occur: Safety/medical concerns ? ?  ?   ? ? ?Walk 150 feet activity ? ? ?Assist Walk 150 feet activity did not occur: Safety/medical concerns ? ?  ?  ?  ? ?Walk 10 feet on uneven surface  ?activity ? ? ?Assist Walk 10 feet on uneven surfaces activity did not occur: Safety/medical concerns ? ? ?  ?   ? ?Wheelchair ? ? ? ? ?Assist Is the patient using a wheelchair?: Yes ?  ?  ? ?Wheelchair assist level: Moderate Assistance - Patient 50 - 74% ?Max wheelchair distance: 150  ? ? ?Wheelchair 50 feet with 2 turns  activity ? ? ? ?Assist ? ?  ?  ? ? ?Assist Level: Moderate Assistance - Patient 50 - 74%  ? ?Wheelchair 150 feet activity  ? ? ? ?Assist ?   ? ? ?Assist Level: Moderate Assistance - Patient 50 - 74%  ? ?Blood pressure (!) 130/91, pulse 66, temperature 98.1 ?F (36.7 ?C), temperature source Oral, resp. rate 16, height 5\' 2"  (1.575 m), weight 77.1 kg, SpO2 97 %. ? ?Medical Problem List and Plan: ?1. 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            -patient may shower ?            -ELOS/Goals:S 10-14 days ?            -Continue CIR therapies including PT, OT, and SLP  ?2.  Antithrombotics: ?-DVT/anticoagulation:  Pharmaceutical: Lovenox ?            -antiplatelet therapy: DAPT X 3 months followed by ASA alone.  ?3. Pain along dorsum of right foot: Tylenol prn. Add voltaren gel prn ?4. Mood: LCSW to follow for evaluation and support.  ?            -antipsychotic agents: N/A ?5. Neuropsych: This patient is not fully capable of making decisions on her own behalf. ?6. Bruising right arm: wrapped to prevent skin tears ?7. Fluids/Electrolytes/Nutrition: Monitor I/O.  ?8. HTN: Permissive hypertension first 7 days and then start normalizing.  ?--Encourage fluid intake.  ?- continue amlodipine 5mg  daily ?3/25 fair control ?9. CKD: Baseline SCr- 1.33? Avoid nephrotoxic medications. Current creatinine is 1.16. Place nursing order to encourage 6-8 glasses of water per day. Discussed herbal teas that can help improve kidney function with patient and her son.  ?10. Anxiety with depression: continue prozac.  ?11. Asthma/H/o Lung Ca-stage 1: Stable on Dulera. Continue regimen.  ?12. GERD: Used PPI intermittently at home.  ?--Daily Pepcid effective at this time.   ?13. Hyperactive bladder: Will resume Enablex for Vesicare (family to bring in home med) ? 3/25 voided 4 x in last 24 hours ?14. Glaucoma: On Cosopt.  ?15. Vitamin D deficiency: start ergocalciferol 50,000U once per week for 7 weeks.  ?16. Insomnia: no longer with  orthostasis- discontinue 5am orthostatic vital signs.  ?17. Suboptimal magnesium level: 250mg  magnesium gluconate started HS.  ?  ? ?LOS: ?3 days ?A FACE TO FACE EVALUATION WAS PERFORMED ? ?Meredith Staggers ?09/27/2021, 10:45 AM  ? ?  ?

## 2021-09-28 NOTE — Plan of Care (Signed)
?  Problem: Consults ?Goal: RH STROKE PATIENT EDUCATION ?Description: See Patient Education module for education specifics  ?Outcome: Progressing ?  ?Problem: RH BLADDER ELIMINATION ?Goal: RH STG MANAGE BLADDER WITH ASSISTANCE ?Description: STG Manage Bladder With toileting Assistance ?Outcome: Progressing ?Goal: RH STG MANAGE BLADDER WITH MEDICATION WITH ASSISTANCE ?Description: STG Manage Bladder With Medication With mod I  Assistance. ?Outcome: Progressing ?  ?Problem: RH SAFETY ?Goal: RH STG ADHERE TO SAFETY PRECAUTIONS W/ASSISTANCE/DEVICE ?Description: STG Adhere to Safety Precautions With cues Assistance/Device. ?Outcome: Progressing ?  ?Problem: RH PAIN MANAGEMENT ?Goal: RH STG PAIN MANAGED AT OR BELOW PT'S PAIN GOAL ?Description: At or below level 4 with prns ?Outcome: Progressing ?  ?Problem: RH KNOWLEDGE DEFICIT ?Goal: RH STG INCREASE KNOWLEDGE OF HYPERTENSION ?Description: Patient and family will be able to manage HTN with medications and dietary modifications using handouts and educational resources independently ?Outcome: Progressing ?Goal: RH STG INCREASE KNOWLEDGE OF STROKE PROPHYLAXIS ?Description: Patient and family will be able to manage secondary stroke risks with medications and dietary modifications using handouts and educational resources independently ?Outcome: Progressing ?  ?

## 2021-09-29 LAB — CBC
HCT: 34.3 % — ABNORMAL LOW (ref 36.0–46.0)
Hemoglobin: 11.2 g/dL — ABNORMAL LOW (ref 12.0–15.0)
MCH: 31.3 pg (ref 26.0–34.0)
MCHC: 32.7 g/dL (ref 30.0–36.0)
MCV: 95.8 fL (ref 80.0–100.0)
Platelets: 300 10*3/uL (ref 150–400)
RBC: 3.58 MIL/uL — ABNORMAL LOW (ref 3.87–5.11)
RDW: 14.1 % (ref 11.5–15.5)
WBC: 4.6 10*3/uL (ref 4.0–10.5)
nRBC: 0 % (ref 0.0–0.2)

## 2021-09-29 MED ORDER — TRAZODONE HCL 50 MG PO TABS
50.0000 mg | ORAL_TABLET | Freq: Every day | ORAL | Status: DC
  Administered 2021-09-29 – 2021-10-08 (×10): 50 mg via ORAL
  Filled 2021-09-29 (×10): qty 1

## 2021-09-29 NOTE — Progress Notes (Signed)
?                                                       PROGRESS NOTE ? ? ?Subjective/Complaints: ? ?Pt reports doing well- needs to get to bathroom- need to void and maybe have BM- LB< yesterday- ?Denies pain;  ?Slept fair- was on trazodone at home- hasn't received here.  ? ? ? ?ROS:  ?Pt denies SOB, abd pain, CP, N/V/C/D, and vision changes ? ? ?Objective: ?  ?No results found. ?Recent Labs  ?  09/29/21 ?0707  ?WBC 4.6  ?HGB 11.2*  ?HCT 34.3*  ?PLT 300  ? ?No results for input(s): NA, K, CL, CO2, GLUCOSE, BUN, CREATININE, CALCIUM in the last 72 hours. ? ? ?Intake/Output Summary (Last 24 hours) at 09/29/2021 1453 ?Last data filed at 09/29/2021 1300 ?Gross per 24 hour  ?Intake 720 ml  ?Output --  ?Net 720 ml  ?  ? ?  ? ?Physical Exam: ?Vital Signs ?Blood pressure 140/83, pulse 65, temperature 97.7 ?F (36.5 ?C), temperature source Oral, resp. rate 16, height 5\' 2"  (1.575 m), weight 77.1 kg, SpO2 100 %. ? ? ? ?General: awake, alert, appropriate, sitting up in bed; waiting for nursing; just called; NAD ?HENT: conjugate gaze; oropharynx moist ?CV: regular rate; no JVD ?Pulmonary: CTA B/L; no W/R/R- good air movement ?GI: soft, NT, ND, (+)BS- hypoactive- finished 100% tray ?Psychiatric: appropriate ?Neurological: Ox3 ? ?Skin: ?   Comments: Right forehead hematoma resolving. Right lateral ankle with resolving ecchymosis. Large bruise left forearm with skin tear that is healing--stable 3/25.   ?Neurological:  ?   Mental Status: She is alert and oriented to person, place, and time.  ?   Comments: follows basic commands. Word finding deficits.  Able to basic answer biographic questions with cues. RLE >RUE weakness with sensory deficits.   ? ?Assessment/Plan: ?1. Functional deficits which require 3+ hours per day of interdisciplinary therapy in a comprehensive inpatient rehab setting. ?Physiatrist is providing close team supervision and 24 hour management of active medical problems listed below. ?Physiatrist and rehab team  continue to assess barriers to discharge/monitor patient progress toward functional and medical goals ? ?Care Tool: ? ?Bathing ?   ?Body parts bathed by patient: Right arm, Left arm, Chest, Abdomen, Front perineal area, Buttocks, Right upper leg, Left upper leg, Right lower leg, Left lower leg, Face  ?   ?  ?  ?Bathing assist Assist Level: Contact Guard/Touching assist ?  ?  ?Upper Body Dressing/Undressing ?Upper body dressing   ?What is the patient wearing?: [place] only ?   ?Upper body assist Assist Level: Supervision/Verbal cueing ?   ?Lower Body Dressing/Undressing ?Lower body dressing ? ? ?   ?What is the patient wearing?: Incontinence brief ? ?  ? ?Lower body assist Assist for lower body dressing: Minimal Assistance - Patient > 75% ?   ? ?Toileting ?Toileting    ?Toileting assist Assist for toileting: Minimal Assistance - Patient > 75% ?  ?  ?Transfers ?Chair/bed transfer ? ?Transfers assist ?   ? ?Chair/bed transfer assist level: Moderate Assistance - Patient 50 - 74% ?  ?  ?Locomotion ?Ambulation ? ? ?Ambulation assist ? ?   ? ?Assist level: Minimal Assistance - Patient > 75% ?Assistive device: Walker-rolling ?Max distance: 60  ? ?Walk 10 feet activity ? ? ?  Assist ?   ? ?Assist level: Minimal Assistance - Patient > 75% ?Assistive device: Walker-rolling  ? ?Walk 50 feet activity ? ? ?Assist Walk 50 feet with 2 turns activity did not occur: Safety/medical concerns ? ?Assist level: Minimal Assistance - Patient > 75% ?Assistive device: Walker-rolling  ? ? ?Walk 150 feet activity ? ? ?Assist Walk 150 feet activity did not occur: Safety/medical concerns ? ?  ?  ?  ? ?Walk 10 feet on uneven surface  ?activity ? ? ?Assist Walk 10 feet on uneven surfaces activity did not occur: Safety/medical concerns ? ? ?  ?   ? ?Wheelchair ? ? ? ? ?Assist Is the patient using a wheelchair?: Yes ?  ?  ? ?Wheelchair assist level: Moderate Assistance - Patient 50 - 74% ?Max wheelchair distance: 150  ? ? ?Wheelchair 50 feet with  2 turns activity ? ? ? ?Assist ? ?  ?  ? ? ?Assist Level: Moderate Assistance - Patient 50 - 74%  ? ?Wheelchair 150 feet activity  ? ? ? ?Assist ?   ? ? ?Assist Level: Moderate Assistance - Patient 50 - 74%  ? ?Blood pressure 140/83, pulse 65, temperature 97.7 ?F (36.5 ?C), temperature source Oral, resp. rate 16, height 5\' 2"  (1.575 m), weight 77.1 kg, SpO2 100 %. ? ?Medical Problem List and Plan: ?1. 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            -patient may shower ?            -ELOS/Goals:S 10-14 days ?            -Continue CIR- PT, OT and SLP ?2.  Antithrombotics: ?-DVT/anticoagulation:  Pharmaceutical: Lovenox ?            -antiplatelet therapy: DAPT X 3 months followed by ASA alone.  ?3. Pain along dorsum of right foot: Tylenol prn. Add voltaren gel prn ? 3/27- denies pain this AM- con't regimen ?4. Mood: LCSW to follow for evaluation and support.  ?            -antipsychotic agents: N/A ?5. Neuropsych: This patient is not fully capable of making decisions on her own behalf. ?6. Bruising right arm: wrapped to prevent skin tears ?7. Fluids/Electrolytes/Nutrition: Monitor I/O.  ?8. HTN: Permissive hypertension first 7 days and then start normalizing.  ?--Encourage fluid intake.  ?- continue amlodipine 5mg  daily ?3/25 fair control ? 3/27- BP borderline- will monitor trend; if need be, will adjust meds ?9. CKD: Baseline SCr- 1.33? Avoid nephrotoxic medications. Current creatinine is 1.16. Place nursing order to encourage 6-8 glasses of water per day. Discussed herbal teas that can help improve kidney function with patient and her son.  ?10. Anxiety with depression: continue prozac.  ?11. Asthma/H/o Lung Ca-stage 1: Stable on Dulera. Continue regimen.  ?12. GERD: Used PPI intermittently at home.  ?--Daily Pepcid effective at this time.   ?13. Hyperactive bladder: Will resume Enablex for Vesicare (family to bring in home med) ? 3/25 voided 4 x in last 24 hours ?14. Glaucoma: On Cosopt.  ?15. Vitamin D  deficiency: start ergocalciferol 50,000U once per week for 7 weeks.  ?16. Insomnia: no longer with orthostasis- discontinue 5am orthostatic vital signs.  ?17. Suboptimal magnesium level: 250mg  magnesium gluconate started HS.  ?18. Insomnia ? 3/27- started trazodone 50 mg QHS per her home dose.  ? ? ?LOS: ?5 days ?A FACE TO FACE EVALUATION WAS PERFORMED ? ?Arlon Bleier ?09/29/2021, 2:53 PM  ? ?  ?

## 2021-09-29 NOTE — Progress Notes (Signed)
Speech Language Pathology Daily Session Note ? ?Patient Details  ?Name: CARMELINE KOWAL ?MRN: [id number] ?Date of Birth: 07/08/1940 ? ?Today's Date: 09/29/2021 ?SLP Individual Time: 7829-5621 ?SLP Individual Time Calculation (min): 58 min ? ?Short Term Goals: ?Week 1: SLP Short Term Goal 1 (Week 1): STG=LTG due to ELOS ? ?Skilled Therapeutic Interventions:Skilled ST services focused on language skills. Pt is able to read at phrase level and required min A to read at mildly complex sentence. SLP recommends further assessment of reading skills. SLP facilitated witting at word level, pt was able to initially write her first name with moderate errors fading to minimal errors when witting via dictation, following several trials of copying lower/upper case. Pt was able to write 1/3 3 letter words via dictation and 3/3 words with dictation of error letter. Pt appeared to demonstrate awareness of written errors with min A fade to supervision A verbal cues. Pt expressed baseline difficulty with phonetics, further exacerbated by acute CVA. Pt was able to write 3/5 letters via dictation. SLP left pt with homework to copy letters of the alphabet. Pt demonstrated moderate word finding deficits in conversations, halting and semantic errors. SLP educated in semantic feature analysis. SLP assisted pt via [place] with supervision A to transfer from [facility] to bed. Pt was left in room with call bell within reach and bed alarm set. SLP recommends to continue skilled services. ?   ? ?Pain ?Pain Assessment ?Pain Score: 0-No pain ? ?Therapy/Group: Individual Therapy ? ?Niasha Devins ?09/29/2021, 2:34 PM ?

## 2021-09-29 NOTE — Progress Notes (Signed)
Occupational Therapy Session Note ? ?Patient Details  ?Name: [name] ?MRN: [id number] ?Date of Birth: December 24, 1940 ? ?Today's Date: 09/29/2021 ?OT Individual Time: 8937-3428 ?OT Individual Time Calculation (min): 45 min  ? ? ?Short Term Goals: ?Week 1:  OT Short Term Goal 1 (Week 1): STGs = LTGs ? ?Skilled Therapeutic Interventions/Progress Updates:  ?  Pt received in bed and agreeable to a shower today. She used her RW to ambulate around her room to gather clothing with CGA and occasional min A to guard balance as she reached to side to get items out of closet.  Pt ambulated to sink to brush teeth and then to toilet. Voided B and B and able to cleanse self.  She stepped into shower to sit on tub bench with CGA.  Completed shower with CGA in standing to wash bottom herself.  Pt stepped out and as she was standing to dry between her legs had a LOB to her R with min A to recover. Pt surprised by this and a bit startled.  Discussed how this is a skill we can work on - dyn balance. ? ?Pt then sat on toilet to complete dressing with min A. Ambulated back to wc to rest. Sitting in wc  with belt alarm on and all needs met.  ? ?Therapy Documentation ?Precautions:  ?Precautions ?Precautions: Fall ?Precaution Comments: monitor orthostatic BPs ?Restrictions ?Weight Bearing Restrictions: No ? ?Pain: ?Pain Assessment ?Pain Scale: 0-10 ?Pain Score: 0-No pain ?ADL: ?ADL ?Eating: Set up ?Grooming: Supervision/safety ?Where Assessed-Grooming: Sitting at sink ?Upper Body Bathing: Supervision/safety ?Where Assessed-Upper Body Bathing: Shower ?Lower Body Bathing: Contact guard ?Where Assessed-Lower Body Bathing: Shower ?Upper Body Dressing: Supervision/safety ?Where Assessed-Upper Body Dressing: Other (Comment) (toilet) ?Lower Body Dressing: Minimal assistance ?Where Assessed-Lower Body Dressing: Other (Comment) (toilet) ?Toileting: Contact guard ?Where Assessed-Toileting: Toilet ?Toilet Transfer: Contact guard ?Toilet Transfer  Method: Ambulating ?Science writer: Grab bars ?Walk-In Shower Transfer: Contact guard ?Walk-In Shower Transfer Method: Ambulating ?Walk-In Shower Equipment: Radio broadcast assistant, Grab bars ?  ? ? ?Therapy/Group: Individual Therapy ? ?[place] ?09/29/2021, 11:42 AM ?

## 2021-09-29 NOTE — Progress Notes (Signed)
Physical Therapy Session Note ? ?Patient Details  ?Name: [name] ?MRN: [id number] ?Date of Birth: 03/28/1941 ? ?Today's Date: 09/29/2021 ?PT Individual Time: 1000-1110 ?PT Individual Time Calculation (min): 70 min  ? ?Short Term Goals: ?Week 1:  PT Short Term Goal 1 (Week 1): STG=LTG due to ELOS ? ?Skilled Therapeutic Interventions/Progress Updates:  ?  Patient received sitting up in wc, agreeable to PT. She denies pain. PT transporting patient in wc to therapy gym for time management and energy conservation. She ambulated 33ft with RW and MinA. R LE ataxia with intermittent R toe catching, no significant LOB. Patient tapping toes to colored dots with 4# ankle weight to R LE for improved proprioceptive input. First with RW, progressing to HHA and then no AD, but MinA provided. Patient completing toe tapping to cones for improved foot clearance and hip flexor activation. RW-> HHA MinA. Patient ambulating 17ft with RW and no weight with noted improved foot clearance and fluency of step. Patient completed 8 mins on NuStep with B UE/LE on level 3 for improved large amplitude coordination. Patient reporting pain in R lateral dorsum of foot with slight weight bearing, but when PT palpated foot, patient reporting pain along arch. Small ball roll outs on R foot to assist with any plantar fascia stretching needed to alleviate pain- noted to alleviate some pain. Patient with very poor proprioception and sensation to R foot requiring PT to hold foot on ball while it was rolling since patient reported not being able to feel whether her foot was on the ball or not. Patient ambulating 154ft with RW and CGA with intermittent verbal cues for B foot clearance and upright posture. Patient returning to room in wc, seatbelt alarm on, call light within reach.  ? ?Therapy Documentation ?Precautions:  ?Precautions ?Precautions: Fall ?Precaution Comments: monitor orthostatic BPs ?Restrictions ?Weight Bearing Restrictions:  No ? ? ? ?Therapy/Group: Individual Therapy ? ? ?Debbora Dus ?Debbora Dus, PT, DPT, CBIS ? ?09/29/2021, 7:33 AM  ?

## 2021-09-30 LAB — BASIC METABOLIC PANEL
Anion gap: 5 (ref 5–15)
BUN: 25 mg/dL — ABNORMAL HIGH (ref 8–23)
CO2: 27 mmol/L (ref 22–32)
Calcium: 9.6 mg/dL (ref 8.9–10.3)
Chloride: 104 mmol/L (ref 98–111)
Creatinine, Ser: 1.4 mg/dL — ABNORMAL HIGH (ref 0.44–1.00)
GFR, Estimated: 38 mL/min — ABNORMAL LOW (ref >60)
Glucose, Bld: 93 mg/dL (ref 70–99)
Potassium: 4 mmol/L (ref 3.5–5.1)
Sodium: 136 mmol/L (ref 135–145)

## 2021-09-30 NOTE — Progress Notes (Signed)
Physical Therapy Session Note ? ?Patient Details  ?Name: [name] ?MRN: [id number] ?Date of Birth: 23-Apr-1941 ? ?Today's Date: 09/30/2021 ?PT Individual Time: 1045-1130 ?PT Individual Time Calculation (min): 45 min  ? ?Short Term Goals: ?Week 1:  PT Short Term Goal 1 (Week 1): STG=LTG due to ELOS ? ?Skilled Therapeutic Interventions/Progress Updates:  ?  Patient received sitting up in recliner, agreeable to PT. She denies pain. Patient ambulating to wc with RW and CGA. PT transporting patient in wc to therapy gym for time management and energy conservation. She ambulated >131ft with RW and CGA in the hallway completing visual scanning and sequencing task retrieving numbered dots. She required Min cues to successfully and completely scan to the R. Patient often ambulating with RW too far anteriorly resulting in increasingly flexed posture. PT applying theraband across walker to serve as visual reminder for proximity to RW. She ambulated 49ft with RW and CGA and appropriate distance from walker noted. Patient completing 5 mins on NuStep with B LE only with emphasis on R LE alignment. She ambulated back to her room with RW and CGA, requesting to return to bed. Bed alarm on, call light within reach.  ? ?Therapy Documentation ?Precautions:  ?Precautions ?Precautions: Fall ?Precaution Comments: monitor orthostatic BPs ?Restrictions ?Weight Bearing Restrictions: No ? ? ? ? ?Therapy/Group: Individual Therapy ? ?Debbora Dus ?Debbora Dus, PT, DPT, CBIS ? ?09/30/2021, 7:33 AM  ?

## 2021-09-30 NOTE — Patient Care Conference (Signed)
Inpatient RehabilitationTeam Conference and Plan of Care Update ?Date: 09/30/2021   Time: 12:17 PM  ? ? ?Patient Name: [name]      ?Medical Record Number: [id number]  ?Date of Birth: May 07, 1941 ?Sex: Female         ?Room/Bed: 4Q03K/7Q25Z-56 ?Payor Info: Payor: [employer] / Plan: [facility] MEDICARE / Product Type: *No Product type* /   ? ?Admit Date/Time:  09/24/2021  3:41 PM ? 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 ?Hospital Problems: Principal Problem: ?  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 ? ? ?Expected Discharge Date: Expected Discharge Date: 10/09/21 ? ?Team Members Present: ?Physician leading conference: Dr. Courtney Heys ?Social Worker Present: Ovidio Kin, LCSW ?Nurse Present: Dorien Chihuahua, RN ?PT Present: Estevan Ryder, PT ?OT Present: Meriel Pica, OT ?SLP Present: Weston Anna, SLP ?PPS Coordinator present : Ileana Ladd, PT ? ?   Current Status/Progress Goal Weekly Team Focus  ?Bowel/Bladder ? ? continent of bowel and bladder  remain continent of bowel and bladder      ?Swallow/Nutrition/ Hydration ? ? H/H diet thin liquids         ?ADL's ? ? CGA overall due to decreased balance  Supervision overall  ADL training, balance, R side strengthening, pt/fam education   ?Mobility ? ? CGA overall with intermittent MinA when ambulating due to decreased R proprioception/coordination, R inattention  supervision  R attention, R hemi coordination, pt/fam ed, dc planning, gait progressions   ?Communication ? ? min A, mod-min A conversation  Supervision A  word finding conversation, higher level word finding (divergent/convergent naming), writting at word level, reading sentence level and following complex commands   ?Safety/Cognition/ Behavioral Observations ? Min A  Supervision A  emergent awareness   ?Pain ? ? Pt denies pain  pain < or =  2/10      ?Skin ? ? no skin issues         ? ? ?Discharge Planning:  ?Daughter and  son are working on a plan for 24 hr supervision at DC. Daughter coming today to see pt and hear conference update   ?Team Discussion: ?Patient using voltaren gel and tylenol prn and reports sleeping better since meds adjusted per MD. Progression limited by poor endurance and balance issues especially with reaching and leaning to the right side due to instability. Struggles with dynamic balance with movement outside the base of support.   ?Patient on target to meet rehab goals: ?yes, patient currently needs CGA overall for ADLs. Function limited by questionable field cut vs inattention or general neglect. Working on error awareness, wrod finding and reading/writing comprehension. ? ?*See Care Plan and progress notes for long and short-term goals.  ? ?Revisions to Treatment Plan:  ?N/A  ?Teaching Needs: ?Safety, medications, transfers, toileting, etc.  ?Current Barriers to Discharge: ?Lack of/limited family support ? ?Possible Resolutions to Barriers: ?Family education ?24/7 supervision ?Has all DME ?  ? ? Medical Summary ?Current Status: continent B/B- skin tear R arm- daughter coming today for training- sleeping better with trazodone ? Barriers to Discharge: Decreased family/caregiver support;Home enviroment access/layout;Weight;Medical stability;Wound care ? Barriers to Discharge Comments: home with son/daughter- not sure plan- ?Possible Resolutions to [employer]: limited by communication-R inattention- possible R field deficit- goqals supervision- will need 24/7 supervision- error awareness is biggest limitation- not cognition- d/c- 4/6 ? ? ?Continued Need for Acute Rehabilitation Level of Care: The patient requires daily medical management by a physician with specialized  training in physical medicine and rehabilitation for the following reasons: ?Direction of a multidisciplinary physical rehabilitation program to maximize functional independence : Yes ?Medical management of patient stability for  increased activity during participation in an intensive rehabilitation regime.: Yes ?Analysis of laboratory values and/or radiology reports with any subsequent need for medication adjustment and/or medical intervention. : Yes ? ? ?I attest that I was present, lead the team conference, and concur with the assessment and plan of the team. ? ? ?[name] ?09/30/2021, 2:48 PM  ? ? ? ? ? ? ?

## 2021-09-30 NOTE — Progress Notes (Signed)
?                                                       PROGRESS NOTE ? ? ?Subjective/Complaints: ? ?Pt reports slept much better- with home dose trazodone.  ?Denies pain ?LBM yesterday ?Ate most of tray- doesn't want yogurt.  ? ? ?ROS:  ? ?Pt denies SOB, abd pain, CP, N/V/C/D, and vision changes ? ? ?Objective: ?  ?No results found. ?Recent Labs  ?  09/29/21 ?0707  ?WBC 4.6  ?HGB 11.2*  ?HCT 34.3*  ?PLT 300  ? ?Recent Labs  ?  09/30/21 ?0718  ?NA 136  ?K 4.0  ?CL 104  ?CO2 27  ?GLUCOSE 93  ?BUN 25*  ?CREATININE 1.40*  ?CALCIUM 9.6  ? ? ? ?Intake/Output Summary (Last 24 hours) at 09/30/2021 1004 ?Last data filed at 09/30/2021 0700 ?Gross per 24 hour  ?Intake 660 ml  ?Output --  ?Net 660 ml  ?  ? ?  ? ?Physical Exam: ?Vital Signs ?Blood pressure (!) 145/85, pulse 62, temperature 98.3 ?F (36.8 ?C), resp. rate 18, height 5\' 2"  (1.575 m), weight 77.1 kg, SpO2 98 %. ? ? ? ? ?General: awake, alert, appropriate, wig on bedside table; sitting EOB; finished 90+% of tray; NAD ?HENT: conjugate gaze; oropharynx moist ?CV: regular rate; no JVD ?Pulmonary: CTA B/L; no W/R/R- good air movement ?GI: soft, NT, ND, (+)BS ?Psychiatric: appropriate ?Neurological: alert ?Skin: ?   Comments: Right forehead hematoma resolving- almost gone. Right lateral ankle with resolving ecchymosis. Large bruise left forearm with skin tear that is healing--stable 3/25.   ?Neurological:  ?   Mental Status: She is alert and oriented to person, place, and time.  ?   Comments: follows basic commands. Word finding deficits.  Able to basic answer biographic questions with cues. RLE >RUE weakness with sensory deficits.   ? ?Assessment/Plan: ?1. Functional deficits which require 3+ hours per day of interdisciplinary therapy in a comprehensive inpatient rehab setting. ?Physiatrist is providing close team supervision and 24 hour management of active medical problems listed below. ?Physiatrist and rehab team continue to assess barriers to discharge/monitor patient  progress toward functional and medical goals ? ?Care Tool: ? ?Bathing ?   ?Body parts bathed by patient: Right arm, Left arm, Chest, Abdomen, Front perineal area, Buttocks, Right upper leg, Left upper leg, Right lower leg, Left lower leg, Face  ?   ?  ?  ?Bathing assist Assist Level: Contact Guard/Touching assist ?  ?  ?Upper Body Dressing/Undressing ?Upper body dressing   ?What is the patient wearing?: [place] only ?   ?Upper body assist Assist Level: Supervision/Verbal cueing ?   ?Lower Body Dressing/Undressing ?Lower body dressing ? ? ?   ?What is the patient wearing?: Incontinence brief ? ?  ? ?Lower body assist Assist for lower body dressing: Minimal Assistance - Patient > 75% ?   ? ?Toileting ?Toileting    ?Toileting assist Assist for toileting: Minimal Assistance - Patient > 75% ?  ?  ?Transfers ?Chair/bed transfer ? ?Transfers assist ?   ? ?Chair/bed transfer assist level: Moderate Assistance - Patient 50 - 74% ?  ?  ?Locomotion ?Ambulation ? ? ?Ambulation assist ? ?   ? ?Assist level: Minimal Assistance - Patient > 75% ?Assistive device: Walker-rolling ?Max distance: 60  ? ?Walk 10  feet activity ? ? ?Assist ?   ? ?Assist level: Minimal Assistance - Patient > 75% ?Assistive device: Walker-rolling  ? ?Walk 50 feet activity ? ? ?Assist Walk 50 feet with 2 turns activity did not occur: Safety/medical concerns ? ?Assist level: Minimal Assistance - Patient > 75% ?Assistive device: Walker-rolling  ? ? ?Walk 150 feet activity ? ? ?Assist Walk 150 feet activity did not occur: Safety/medical concerns ? ?  ?  ?  ? ?Walk 10 feet on uneven surface  ?activity ? ? ?Assist Walk 10 feet on uneven surfaces activity did not occur: Safety/medical concerns ? ? ?  ?   ? ?Wheelchair ? ? ? ? ?Assist Is the patient using a wheelchair?: Yes ?  ?  ? ?Wheelchair assist level: Moderate Assistance - Patient 50 - 74% ?Max wheelchair distance: 150  ? ? ?Wheelchair 50 feet with 2 turns activity ? ? ? ?Assist ? ?  ?  ? ? ?Assist  Level: Moderate Assistance - Patient 50 - 74%  ? ?Wheelchair 150 feet activity  ? ? ? ?Assist ?   ? ? ?Assist Level: Moderate Assistance - Patient 50 - 74%  ? ?Blood pressure (!) 145/85, pulse 62, temperature 98.3 ?F (36.8 ?C), resp. rate 18, height 5\' 2"  (1.575 m), weight 77.1 kg, SpO2 98 %. ? ?Medical Problem List and Plan: ?1. 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            -patient may shower ?            -ELOS/Goals:S 10-14 days ?            -Continue CIR- PT, OT and SLP ? Team conference today- to determine length of stay ?2.  Antithrombotics: ?-DVT/anticoagulation:  Pharmaceutical: Lovenox ?            -antiplatelet therapy: DAPT X 3 months followed by ASA alone.  ?3. Pain along dorsum of right foot: Tylenol prn. Add voltaren gel prn ? 3/28- denies pain- con't regimen ?4. Mood: LCSW to follow for evaluation and support.  ?            -antipsychotic agents: N/A ?5. Neuropsych: This patient is not fully capable of making decisions on her own behalf. ?6. Bruising right arm: wrapped to prevent skin tears ?7. Fluids/Electrolytes/Nutrition: Monitor I/O.  ?8. HTN: Permissive hypertension first 7 days and then start normalizing.  ?--Encourage fluid intake.  ?- continue amlodipine 5mg  daily ? 3/28- BP borderline- will give 1 more day, then intervene-  ?9. CKD: Baseline SCr- 1.33? Avoid nephrotoxic medications. Current creatinine is 1.16. Place nursing order to encourage 6-8 glasses of water per day. ?3/28- Cr 1.40- will recheck Thursday-  ?10. Anxiety with depression: continue prozac.  ?11. Asthma/H/o Lung Ca-stage 1: Stable on Dulera. Continue regimen.  ?12. GERD: Used PPI intermittently at home.  ?--Daily Pepcid effective at this time.   ?13. Hyperactive bladder: Will resume Enablex for Vesicare (family to bring in home med) ? 3/25 voided 4 x in last 24 hours ?14. Glaucoma: On Cosopt.  ?15. Vitamin D deficiency: start ergocalciferol 50,000U once per week for 7 weeks.  ?16. Orthostasis: no longer with  orthostasis- discontinue 5am orthostatic vital signs.  ?17. Suboptimal magnesium level: 250mg  magnesium gluconate started HS.  ?18. Insomnia ? 3/27- started trazodone 50 mg QHS per her home dose.  ? 3/28- slept MUCH better- con't home dose ? ? ?I spent a total of 36   minutes on total care today- >50%  coordination of care- due to team conference to determine length of stay and d/w team ? ? ? ?LOS: ?6 days ?A FACE TO FACE EVALUATION WAS PERFORMED ? ?Dannette Kinkaid ?09/30/2021, 10:04 AM  ? ?  ?

## 2021-09-30 NOTE — Progress Notes (Signed)
Patient ID: [name], female   DOB: January 05, 1941, 81 y.o.   MRN: [id number]  Met with pt and daughter who came in from [place] to see pt and work on discharge needs of 24/7 care. Both pleased with pt's progress since admission and are aware of team's recommendation of 24/7 supervision level with target discharge date of 4/6. Daughter will be there with her when she is discharged. She has the private duty list to hire assist and also discussed assisted living facilities. Will work on discharge needs and plans for 4/6. Daughter will be here for a few days with pt and see her in therapies. ?

## 2021-09-30 NOTE — Progress Notes (Signed)
Speech Language Pathology Daily Session Note ? ?Patient Details  ?Name: ADELINA COLLARD ?MRN: [id number] ?Date of Birth: [birth year] ? ?Today's Date: 09/30/2021 ?SLP Individual Time: 7062-3762 ?SLP Individual Time Calculation (min): 44 min ? ?Short Term Goals: ?Week 1: SLP Short Term Goal 1 (Week 1): STG=LTG due to ELOS ? ?Skilled Therapeutic Interventions: ?Pt seen for skilled ST with focus on speech and language goals, pt upright in wheelchair and pleasantly agreeable to therapeutic tasks. Pt did some independent work with letter tracing/copying, states it was challenging for her. Pt participating in further functional writing task including signature practice which patient required verbal and visual cues to increase accuracy (pt struggled with order and shape of similar letters "g" and "q" next to each other in last name). Pt participating in portions of RCBA - "Word-Visual" - 80%, "Word-Auditory" - 100%, "Word-Semantic" - 100%, initiated Functional Reading but unable to complete due to time constraints. Pt benefiting from extra time during assessment, able to ID errors and self correct on 100% opportunities. Pt left in wheelchair awaiting next therapy session, cont ST POC.  ? ?Pain ?None reported ? ?Therapy/Group: Individual Therapy ? ?[name] ?09/30/2021, 10:15 AM ?

## 2021-09-30 NOTE — Progress Notes (Signed)
Occupational Therapy Session Note ? ?Patient Details  ?Name: [name] ?MRN: [id number] ?Date of Birth: 04/03/1941 ? ?Today's Date: 09/30/2021 ?OT Individual Time: 4720-7218 ?OT Individual Time Calculation (min): 45 min  ? ? ?Short Term Goals: ?Week 1:  OT Short Term Goal 1 (Week 1): STGs = LTGs ? ?Skilled Therapeutic Interventions/Progress Updates:  ?  Pt received supine with no c/o pain, agreeable to OT session. Pt completed bed mobility to EOB with (S). CGA for ambulatory transfer to the bathroom, min cueing for RW management. Pt completed toileting tasks with CGA overall. Continent urine void. Pt completed LB Adls with min A overall seated on the toilet. UB dressing with (S) with min cueing. Pt returned to the EOB with CGA using the RW. Increased time required for motor planning. She ended the session with 300 ft of functional mobility with the RW with CGA. She required min cueing for RW management during turns. She was left supine with all needs met, bed alarm set. Daughter present.  ? ?Therapy Documentation ?Precautions:  ?Precautions ?Precautions: Fall ?Precaution Comments: monitor orthostatic BPs ?Restrictions ?Weight Bearing Restrictions: No ? ? ?Therapy/Group: Individual Therapy ? ?Curtis Sites ?09/30/2021, 6:31 AM ?

## 2021-09-30 NOTE — Progress Notes (Signed)
Occupational Therapy Session Note ? ?Patient Details  ?Name: [name] ?MRN: [id number] ?Date of Birth: Feb 04, 1941 ? ?Today's Date: 09/30/2021 ?OT Individual Time: 6168-3729 ?OT Individual Time Calculation (min): 45 min  ? ? ?Short Term Goals: ?Week 1:  OT Short Term Goal 1 (Week 1): STGs = LTGs ? ?Skilled Therapeutic Interventions/Progress Updates:  ?  Pt received in wc ready for therapy. Pt requested to toilet.  She used RW to ambulate to bathroom with CGA, no R lean noted. Pt used R hand on grab bar to lower slowly to toilet. She said she has a grab bar at home on her R side of toilet and a sink on her L.  Pt completed toileting with CGA then ambulated out to sink to wash hands with good awareness of R side and even cleaned water off of R side of sink.  ? ?Sat in wc and was transported to gym to work at // bars.  Pt has a fairly high step in shower. Pt practiced stepping over yoga blocks to simulate shower ledge.  Standing with support from bars for LE coordination of alternating tapping yoga blocks with toes.  Pt able to do this with CGA and support of bars. ? ?Pt then sat at table to work on a visual perception exercise using a design with push pins for a [facility] challenge. Push pins placed on her R side and pt able to find the pins easily to coordinate with the colors of the design. Pt did well with activity. During exercise, pt talked quite a bit and was able to express herself more clearly than she did in the last few days. ? ?Pt taken back to room, ambulated to sit in recliner,  resting in recliner with belt alarm on and all needs met.  ? ?Therapy Documentation ?Precautions:  ?Precautions ?Precautions: Fall ?Precaution Comments: monitor orthostatic BPs ?Restrictions ?Weight Bearing Restrictions: No ? ?  ?Vital Signs: ?Therapy Vitals ?Resp: 18 ?Patient Position (if appropriate): Sitting ?Oxygen Therapy ?O2 Device: Room Air ?Pain: ?Pain Assessment ?Pain Score: 0-No pain ?ADL: ?ADL ?Eating: Set up ?Grooming:  Supervision/safety ?Where Assessed-Grooming: Sitting at sink ?Upper Body Bathing: Supervision/safety ?Where Assessed-Upper Body Bathing: Shower ?Lower Body Bathing: Contact guard ?Where Assessed-Lower Body Bathing: Shower ?Upper Body Dressing: Supervision/safety ?Where Assessed-Upper Body Dressing: Other (Comment) (toilet) ?Lower Body Dressing: Minimal assistance ?Where Assessed-Lower Body Dressing: Other (Comment) (toilet) ?Toileting: Contact guard ?Where Assessed-Toileting: Toilet ?Toilet Transfer: Contact guard ?Toilet Transfer Method: Ambulating ?Science writer: Grab bars ?Walk-In Shower Transfer: Contact guard ?Walk-In Shower Transfer Method: Ambulating ?Walk-In Shower Equipment: Radio broadcast assistant, Grab bars ? ? ?Therapy/Group: Individual Therapy ? ?[place] ?09/30/2021, 11:57 AM ?

## 2021-10-01 NOTE — Progress Notes (Signed)
Physical Therapy Session Note ? ?Patient Details  ?Name: [name] ?MRN: [id number] ?Date of Birth: 04-16-1941 ? ?Today's Date: 10/01/2021 ?PT Individual Time: 5366-4403; 4742-5956 ?PT Individual Time Calculation (min): 55 min and 27 mins ? ?Short Term Goals: ?Week 1:  PT Short Term Goal 1 (Week 1): STG=LTG due to ELOS ? ?Skilled Therapeutic Interventions/Progress Updates:  ?  Session 1: Patient received sitting up in bed, agreeable to PT. She reports soreness in her R foot- declined pain meds. PT providing rest breaks, distractions and repositioning to assist with pain management. She was able to come sit edge of bed with supervision. Standing with RW and MinA to doff/don underwear and pants. Donning shirt with CGA. Patient standing at sink with RW and CGA to complete morning ADLs. PT transporting patient in wc to therapy gym for time management and energy conservation. She completed visual scanning task and able to scan to the R without additional cuing. Patient completing cog task selecting letter to complete a word given coordinates. Patient remains with word finding difficulty, especially with numbers it seems. Patient able to engage in conversation about her family with Min cues for word finding. Patient ambulating 360ft back to her room with RW and CGA/MinA. She remained up in recliner, seatbelt alarm on, call light within reach.  ? ? ?Session 2: Patient received sitting up in wc, agreeable to PT. Dtr present. She denies pain. Patient ambulating to dayroom with RW and CGA. Improved proximity to walker noted and decreased lateral deviation from path. Patient weaving through cones with RW and CGA. Improving attention to R and ability to control R side of RW. Patient ambulating back to her room with RW and close supervision. Returning to bed, bed alarm on, call light within reach.  ? ?Therapy Documentation ?Precautions:  ?Precautions ?Precautions: Fall ?Precaution Comments: monitor orthostatic  BPs ?Restrictions ?Weight Bearing Restrictions: No ? ? ? ? ?Therapy/Group: Individual Therapy ? ?Debbora Dus ?Debbora Dus, PT, DPT, CBIS ? ?10/01/2021, 7:38 AM  ?

## 2021-10-01 NOTE — Progress Notes (Signed)
?                                                       PROGRESS NOTE ? ? ?Subjective/Complaints: ? ?Pt reports still sleeping better on trazodone.  ?Wants therapy to start at 9am if possible.  ?Working with SLP on reading today.  ? ?LBM yesterday.  ? ?ROS:  ? ?Pt denies SOB, abd pain, CP, N/V/C/D, and vision changes ? ? ?Objective: ?  ?No results found. ?Recent Labs  ?  09/29/21 ?0707  ?WBC 4.6  ?HGB 11.2*  ?HCT 34.3*  ?PLT 300  ? ?Recent Labs  ?  09/30/21 ?0718  ?NA 136  ?K 4.0  ?CL 104  ?CO2 27  ?GLUCOSE 93  ?BUN 25*  ?CREATININE 1.40*  ?CALCIUM 9.6  ? ? ? ?Intake/Output Summary (Last 24 hours) at 10/01/2021 1022 ?Last data filed at 09/30/2021 1844 ?Gross per 24 hour  ?Intake 360 ml  ?Output --  ?Net 360 ml  ?  ? ?  ? ?Physical Exam: ?Vital Signs ?Blood pressure 133/79, pulse 75, temperature (!) 97.5 ?F (36.4 ?C), temperature source Oral, resp. rate 18, height 5\' 2"  (1.575 m), weight 77.1 kg, SpO2 95 %. ? ? ? ? ? ?General: awake, alert, appropriate, sitting EOB; SLP in room;  NAD ?HENT: conjugate gaze; oropharynx moist ?CV: regular rate; no JVD ?Pulmonary: CTA B/L; no W/R/R- good air movement ?GI: soft, NT, ND, (+)BS ?Psychiatric: appropriate ?MS: leaning to right when attempting to examine her- almost fell to right ?Neurological: Ox2- doing better/aphasic- at sentence level ?Skin: ?   Comments: Right forehead hematoma resolving- almost gone. Right lateral ankle with resolving ecchymosis. Large bruise left forearm with skin tear that is healing--helaing   ?Neurological:  ?   Mental Status: She is alert and oriented to person, place, and time.  ?   Comments: follows basic commands. Word finding deficits.  Able to basic answer biographic questions with cues. RLE >RUE weakness with sensory deficits.   ? ?Assessment/Plan: ?1. Functional deficits which require 3+ hours per day of interdisciplinary therapy in a comprehensive inpatient rehab setting. ?Physiatrist is providing close team supervision and 24 hour management  of active medical problems listed below. ?Physiatrist and rehab team continue to assess barriers to discharge/monitor patient progress toward functional and medical goals ? ?Care Tool: ? ?Bathing ?   ?Body parts bathed by patient: Right arm, Left arm, Chest, Abdomen, Front perineal area, Buttocks, Right upper leg, Left upper leg, Right lower leg, Left lower leg, Face  ?   ?  ?  ?Bathing assist Assist Level: Contact Guard/Touching assist ?  ?  ?Upper Body Dressing/Undressing ?Upper body dressing   ?What is the patient wearing?: [place] only ?   ?Upper body assist Assist Level: Supervision/Verbal cueing ?   ?Lower Body Dressing/Undressing ?Lower body dressing ? ? ?   ?What is the patient wearing?: Incontinence brief ? ?  ? ?Lower body assist Assist for lower body dressing: Minimal Assistance - Patient > 75% ?   ? ?Toileting ?Toileting    ?Toileting assist Assist for toileting: Minimal Assistance - Patient > 75% ?  ?  ?Transfers ?Chair/bed transfer ? ?Transfers assist ?   ? ?Chair/bed transfer assist level: Moderate Assistance - Patient 50 - 74% ?  ?  ?Locomotion ?Ambulation ? ? ?Ambulation assist ? ?   ? ?  Assist level: Minimal Assistance - Patient > 75% ?Assistive device: Walker-rolling ?Max distance: 60  ? ?Walk 10 feet activity ? ? ?Assist ?   ? ?Assist level: Minimal Assistance - Patient > 75% ?Assistive device: Walker-rolling  ? ?Walk 50 feet activity ? ? ?Assist Walk 50 feet with 2 turns activity did not occur: Safety/medical concerns ? ?Assist level: Minimal Assistance - Patient > 75% ?Assistive device: Walker-rolling  ? ? ?Walk 150 feet activity ? ? ?Assist Walk 150 feet activity did not occur: Safety/medical concerns ? ?  ?  ?  ? ?Walk 10 feet on uneven surface  ?activity ? ? ?Assist Walk 10 feet on uneven surfaces activity did not occur: Safety/medical concerns ? ? ?  ?   ? ?Wheelchair ? ? ? ? ?Assist Is the patient using a wheelchair?: Yes ?  ?  ? ?Wheelchair assist level: Moderate Assistance - Patient  50 - 74% ?Max wheelchair distance: 150  ? ? ?Wheelchair 50 feet with 2 turns activity ? ? ? ?Assist ? ?  ?  ? ? ?Assist Level: Moderate Assistance - Patient 50 - 74%  ? ?Wheelchair 150 feet activity  ? ? ? ?Assist ?   ? ? ?Assist Level: Moderate Assistance - Patient 50 - 74%  ? ?Blood pressure 133/79, pulse 75, temperature (!) 97.5 ?F (36.4 ?C), temperature source Oral, resp. rate 18, height 5\' 2"  (1.575 m), weight 77.1 kg, SpO2 95 %. ? ?Medical Problem List and Plan: ?1. 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            -patient may shower ?            -ELOS/Goals:S 10-14 days ?            d/c 10/09/21 ? Con't CIR_ PT, OT and SLP ?2.  Antithrombotics: ?-DVT/anticoagulation:  Pharmaceutical: Lovenox ?            -antiplatelet therapy: DAPT X 3 months followed by ASA alone.  ?3. Pain along dorsum of right foot: Tylenol prn. Add voltaren gel prn ? 3/29- denies pain- con't regimen prn ?4. Mood: LCSW to follow for evaluation and support.  ?            -antipsychotic agents: N/A ?5. Neuropsych: This patient is not fully capable of making decisions on her own behalf. ?6. Bruising right arm: wrapped to prevent skin tears ?7. Fluids/Electrolytes/Nutrition: Monitor I/O.  ?8. HTN: Permissive hypertension first 7 days and then start normalizing.  ?--Encourage fluid intake.  ?- continue amlodipine 5mg  daily ? 3/28- BP borderline- will give 1 more day, then intervene-  ?9. CKD: Baseline SCr- 1.33? Avoid nephrotoxic medications. Current creatinine is 1.16. Place nursing order to encourage 6-8 glasses of water per day. ?3/28- Cr 1.40- will recheck Thursday-  ?10. Anxiety with depression: continue prozac.  ?11. Asthma/H/o Lung Ca-stage 1: Stable on Dulera. Continue regimen.  ?12. GERD: Used PPI intermittently at home.  ?--Daily Pepcid effective at this time.   ?13. Hyperactive bladder: Will resume Enablex for Vesicare (family to bring in home med) ? 3/25 voided 4 x in last 24 hours ? 3/29- voiding- emptying.  ?14. Glaucoma:  On Cosopt.  ?15. Vitamin D deficiency: start ergocalciferol 50,000U once per week for 7 weeks.  ?16. Orthostasis: no longer with orthostasis- discontinue 5am orthostatic vital signs.  ?17. Suboptimal magnesium level: 250mg  magnesium gluconate started HS.  ?18. Insomnia ? 3/27- started trazodone 50 mg QHS per her home dose.  ? 3/29- sleeping  well-  ? ? ? ? ? ?LOS: ?7 days ?A FACE TO FACE EVALUATION WAS PERFORMED ? ?Cleaven Demario ?10/01/2021, 10:22 AM  ? ?  ?

## 2021-10-01 NOTE — Evaluation (Signed)
Recreational Therapy Assessment and Plan ? ?Patient Details  ?Name: [name] ?MRN: [id number] ?Date of Birth: 1941-02-02 ?Today's Date: 10/01/2021 ? ?Rehab Potential:  Good ?ELOS:   d/c 4/6 ? ?Assessment ? ? Hospital Problem: Principal Problem: ?  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  ?  ?Past Medical History:  ?    ?Past Medical History:  ?Diagnosis Date  ?  Multiple pulmonary nodules, largest R mid lung 06/29/2013  ?  Followed in Pulmonary clinic/ [facility]/ [place] - see CXR 06/27/13  - CT chest 07/14/2013 > 1. No acute findings in the thorax to account for the patient's  symptoms.  2. However, there is a subsolid nodule in the   right lower lobe that has a ground-glass attenuation component  measuring 2.5 x 1.8 cm, and a small central solid component  measuring 4 mm. Initial follow-up by chest CT without   ? Adenocarcinoma of lung, stage 1 ([place])    ?  Status post right lower lobectomy August 2015  ? Anxiety    ? Asthma    ? Atrophic vaginitis    ? Chronic cough 12/20/2018  ? Chronic iritis of left eye    ?  Pupil stays dilated; nonreactive  ? Chronic kidney disease, stage 3b ([place])    ? Colon polyp    ? Contact lens/glasses fitting    ?  wears contacts or glasses  ? 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  Followed in Pulmonary clinic/ [facility]/ [place] in her 46's - Spirometry 02/23/13 wnl  - 07/03/2013 Sinus CT > Mild chronic sinus disease - No acute findings. Left-to-right nasal septal deviation of 3 mm. -med calendar 08/08/13 , redone 06/01/2016 and 02/22/2017  - Eos 4%   10/13/2013  > rec singulair daily  - FENO 05/12/2016  =   24 on singulair  - Allergy profile 05/12/2016 >   IgE  47 pos  ? Depression    ? Depression with anxiety    ? Dysuria 08/03/2019  ? Environmental allergies    ? Essential hypertension 02/23/2017  ?  Changed losartan to avapro 02/22/2017 due to cough   ? GERD (gastroesophageal reflux disease)    ? Hypertension    ?  Hypertriglyceridemia    ? Kidney stones    ? OA (osteoarthritis)    ? Osteopenia    ? PONV (postoperative nausea and vomiting)    ? S/P lobectomy of lung 02/12/2014  ? 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  ?Past Surgical History:  ?     ?Past Surgical History:  ?Procedure Laterality Date  ? APPENDECTOMY      ? COLONOSCOPY      ? EYE SURGERY Left    ?  cataract removal  ? ORIF WRIST FRACTURE Left 08/04/2013  ?  Procedure: OPEN REDUCTION INTERNAL FIXATION (ORIF) LEFT DISTAL RADIUS WRIST FRACTURE;  Surgeon: Wynonia Sours, MD;  Location: [facility];  Service: Orthopedics;  Laterality: Left;  ? PARTIAL HYSTERECTOMY      ? TOTAL KNEE ARTHROPLASTY Left 08/26/2018  ?  Procedure: TOTAL KNEE ARTHROPLASTY;  Surgeon: Netta Cedars, MD;  Location: [facility];  Service: Orthopedics;  Laterality: Left;  ? VIDEO ASSISTED THORACOSCOPY (VATS)/WEDGE RESECTION Right 02/12/2014  ?  Procedure: RIGHT VIDEO ASSISTED THORACOSCOPY RIGHT LOWER LOBE LUNG [place] RESECTION,RIGHT THORACOTOMY WITH RIGHT LOWER LOBE LOBECTOMY & NODE DISSECTION;  Surgeon: Remo Lipps  Chaya Jan, MD;  Location: [facility];  Service: Thoracic;  Laterality: Right;  ? VIDEO ASSISTED THORACOSCOPY (VATS)/WEDGE RESECTION Right 02/12/2014  ? VIDEO BRONCHOSCOPY N/A 02/12/2014  ?  Procedure: VIDEO BRONCHOSCOPY;  Surgeon: Melrose Nakayama, MD;  Location: [facility];  Service: Thoracic;  Laterality: N/A;  ?  ?  ?Assessment & Plan ?Clinical Impression: Patient is a 81 year old female with history of Lung CA stage I, asthma, CKD, HTN who was admitted on 09/21/21 after fall and noted to have difficulty speaking.  CT head/neck without acute abnormality and chronic lacunar infarcts in left caudate head. She developed signs of right neglect  and CTA head/neck showed occlusion of distal L-MCA branch at posterior sylvian fissure and 70% stenosis of proximal R-ICA due to predominantly calcified atherosclerosis. She was out of  window for [facility] and went on to develop sided weakness and RLE numbness. MRI brain showed L-MCA infarct affecting left parietal and temporal lobes. She was found to have mildly orthostatic changes. She was treated with fluid bolus and bed rest.  Dr. Barrington Ellison that stroke was likely due to intracranial stenosis but cardiac source not ruled out. He recommended DAPT X 3 months followed by ASA alone and CardioNet monitoring after discharge.  Renal status has improved with IVF for hydration. She has had issues with right ankle pain post fall and X rays were negative for fracture. 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   Patient transferred to CIR on 09/24/2021 .  ?  ?Met with pt today to discuss TR services including leisure education, activity analysis/modifications and stress management.  Also discussed the importance of social, emotional, spiritual health in addition to physical health and their effects on overall health and wellness.  Pt stated understanding.  Discussed community reintegration and pt is agreeable to participate in an outing tomorrow. ? ?Pt presents with decreased activity tolerance, decreased functional mobility, decreased balance, decreased coordination Limiting pt's independence with leisure/community pursuits.  ? ?Plan ? Min 1 TR session >20 min ? ?Recommendations for other services: None  ? ?Discharge Criteria: Patient will be discharged from TR if patient refuses treatment 3 consecutive times without medical reason.  If treatment goals not met, if there is a change in medical status, if patient makes no progress towards goals or if patient is discharged from hospital. ? ?The above assessment, treatment plan, treatment alternatives and goals were discussed and mutually agreed upon: by patient ? ?[name] ?10/01/2021, 3:44 PM  ?

## 2021-10-01 NOTE — Progress Notes (Signed)
Occupational Therapy Session Note ? ?Patient Details  ?Name: [name] ?MRN: [id number] ?Date of Birth: [birth year] ? ?Today's Date: 10/01/2021 ?OT Individual Time: 1100-1206 ?OT Individual Time Calculation (min): 66 min  ? ? ?Short Term Goals: ?Week 1:  OT Short Term Goal 1 (Week 1): STGs = LTGs ? ?Skilled Therapeutic Interventions/Progress Updates:  ?  Pt received in recliner ready for therapy.  Pt stood with CGA and was able to ambulate from her room to elevator with min A to guard R lean.  Pt sat in arm chair by elevators to rest, then continued a walk to ADL apt. She felt this distance was a bit fatiguing so discussed using wc to go back to her room. ? ?In ADL apartment worked on tasks to challenge balance, R side coordination and awareness: ?-sit to stand from recliner 3x with min A ?-sit to stand from low couch 1x with min A ?-hanging pillow cases on hangers and reaching to place on clothing rod, hanging cases by clothes pins with min A for balance due to R lean. Cued pt to stand with staggered stance for increased ability to weight shift without losing her balance.   ?-reaching below a shelf to practice reaching for items in a "dryer" ?-removing cases and placing in laundry basket ?-sat to fold 10 pillow cases and 1 large sheet ? ? ?With all stand to sits, pt needs cues to always reach back.  She does better if we just move the walker away from her, then she reaches back without a cue.  ? ?Discussed her home responsibilities of making coffee, light lunches, her own laundry. She has a Chartered certified accountant that does her sheets.   ? ?Pt transported back to room in w/c. Once in room used RW to ambulate to toilet then to sink to complete self care. Opted to sit in wc for lunch. Belt alarm on and all needs met.   ? ? ?Therapy Documentation ?Precautions:  ?Precautions ?Precautions: Fall ?Precaution Comments: monitor orthostatic BPs ?Restrictions ?Weight Bearing Restrictions: No ?Pain:no c/o pain ?  ?ADL: ?ADL ?Eating:  Set up ?Grooming: Supervision/safety ?Where Assessed-Grooming: Sitting at sink ?Upper Body Bathing: Supervision/safety ?Where Assessed-Upper Body Bathing: Shower ?Lower Body Bathing: Contact guard ?Where Assessed-Lower Body Bathing: Shower ?Upper Body Dressing: Supervision/safety ?Where Assessed-Upper Body Dressing: Other (Comment) (toilet) ?Lower Body Dressing: Minimal assistance ?Where Assessed-Lower Body Dressing: Other (Comment) (toilet) ?Toileting: Supervision/safety ?Where Assessed-Toileting: Toilet ?Toilet Transfer: Contact guard ?Toilet Transfer Method: Ambulating ?Science writer: Grab bars ?Walk-In Shower Transfer: Contact guard ?Walk-In Shower Transfer Method: Ambulating ?Walk-In Shower Equipment: Radio broadcast assistant, Grab bars ? ?Therapy/Group: Individual Therapy ? ?[place] ?10/01/2021, 12:26 PM ?

## 2021-10-01 NOTE — Progress Notes (Signed)
Speech Language Pathology Daily Session Note ? ?Patient Details  ?Name: [name] ?MRN: [id number] ?Date of Birth: [birth year] ? ?Today's Date: 10/01/2021 ?SLP Individual Time: 8978-4784 ?SLP Individual Time Calculation (min): 42 min ? ?Short Term Goals: ?Week 1: SLP Short Term Goal 1 (Week 1): STG=LTG due to ELOS ? ?Skilled Therapeutic Interventions:Skilled ST services focused on language skills. SLP facilitated continued assessment of reading utilizing RCAB, functional reading subsection, pt demonstrated 60% accuracy and with mod A cues for reading acuity and simplifying the complexity of the sentence 100% accuracy. Pt demonstrated phonemic, semantic and decoding errors with min A verbal cues for awareness of errors. Pt was able to name letters of the alphabet in order 100%, but when given in random order required min A verbal cues to utilize visual aids (display with fingers or write) and singing the alphabet song. Pt was left in room with call bell within reach and bed alarm set. SLP recommends to continue skilled services. ? ? ?   ? ?Pain ?Pain Assessment ?Pain Score: 0-No pain ? ?Therapy/Group: Individual Therapy ? ?Sueko Dimichele ?10/01/2021, 12:49 PM ?

## 2021-10-02 LAB — BASIC METABOLIC PANEL
Anion gap: 7 (ref 5–15)
BUN: 32 mg/dL — ABNORMAL HIGH (ref 8–23)
CO2: 24 mmol/L (ref 22–32)
Calcium: 9 mg/dL (ref 8.9–10.3)
Chloride: 106 mmol/L (ref 98–111)
Creatinine, Ser: 1.74 mg/dL — ABNORMAL HIGH (ref 0.44–1.00)
GFR, Estimated: 29 mL/min — ABNORMAL LOW (ref >60)
Glucose, Bld: 96 mg/dL (ref 70–99)
Potassium: 4.4 mmol/L (ref 3.5–5.1)
Sodium: 137 mmol/L (ref 135–145)

## 2021-10-02 MED ORDER — ENOXAPARIN SODIUM 30 MG/0.3ML IJ SOSY
30.0000 mg | PREFILLED_SYRINGE | INTRAMUSCULAR | Status: DC
  Administered 2021-10-03 – 2021-10-08 (×6): 30 mg via SUBCUTANEOUS
  Filled 2021-10-02 (×6): qty 0.3

## 2021-10-02 MED ORDER — AMLODIPINE BESYLATE 10 MG PO TABS
10.0000 mg | ORAL_TABLET | Freq: Every day | ORAL | Status: DC
Start: 2021-10-03 — End: 2021-10-09
  Administered 2021-10-03 – 2021-10-09 (×7): 10 mg via ORAL
  Filled 2021-10-02 (×7): qty 1

## 2021-10-02 MED ORDER — SODIUM CHLORIDE 0.9 % IV SOLN
INTRAVENOUS | Status: AC
Start: 2021-10-02 — End: 2021-10-03

## 2021-10-02 NOTE — Progress Notes (Signed)
Physical Therapy Weekly Progress Note ? ?Patient Details  ?Name: [name] ?MRN: [id number] ?Date of Birth: 1940/11/10 ? ?Beginning of progress report period: September 25, 2021 ?End of progress report period: October 02, 2021 ? ?Today's Date: 10/02/2021 ?PT Individual Time: 4665-9935 ?PT Individual Time Calculation (min): 58 min  ? ?Patient has met 0 of 1 short term goals.  Patient is progressing well toward her LTG. She remains grossly CGA/close supervision using RW or CGA/MinA without an AD. She does present with mild R inattention and decreased motor planning as well as word finding difficulty.  ? ?Patient continues to demonstrate the following deficits muscle weakness, decreased cardiorespiratoy endurance, decreased attention to right, decreased attention, decreased awareness, decreased problem solving, decreased safety awareness, decreased memory, and delayed processing, and decreased sitting balance, decreased standing balance, decreased postural control, hemiplegia, and decreased balance strategies and therefore will continue to benefit from skilled PT intervention to increase functional independence with mobility. ? ?Patient progressing toward long term goals..  Continue plan of care. ? ?PT Short Term Goals ?Week 1:  PT Short Term Goal 1 (Week 1): STG=LTG due to ELOS ?PT Short Term Goal 1 - Progress (Week 1): Progressing toward goal ?Week 2:  PT Short Term Goal 1 (Week 2): STG=LTG based on ELOS ? ?Skilled Therapeutic Interventions/Progress Updates:  ?  Patient received sitting EOB with NT present, dressing. Agreeable to PT, denies pain. Patient donning shirt with MinA for orienting. Able to stand with CGA to pull up pants. She ambulated to therapy gym with RW and CGA. Ambulatory toe tapping + stepping over cones with RW and MinA + consistent verbal cues to complete accurately. Improving lateral weight shift. Patient then completing task of toe tapping with no AD. Noting premature weight shift back to the R  when stepping with L LE. Patient ambulating 126f with no AD and CGA/MinA. Patient more with fear of falling than LOB. Patient also with progressive R lateral lean with fatigue and poor awareness of this. Ambulated additional 815fwith no AD and CGA with mirror for visual feedback on posture. Able to self-monitor R lateral lean and correct. She ambulated ~30069fack to her room with RW and CGA. Returning to bed per patient request. Bed alarm on, call light within reach.  ? ?Therapy Documentation ?Precautions:  ?Precautions ?Precautions: Fall ?Precaution Comments: monitor orthostatic BPs ?Restrictions ?Weight Bearing Restrictions: No ? ? ?Therapy/Group: Individual Therapy ? ?[name], DPT, CBIS ? ?10/02/2021, 8:21 AM  ?

## 2021-10-02 NOTE — Progress Notes (Signed)
?                                                       PROGRESS NOTE ? ? ?Subjective/Complaints: ? ?.Pt asking about  going to someone's graduation.  ?[name] she can go in May 2023- but not April, by flying.  ? ?Also, although pt has no complaints, her BUN and Cr are much more elevated than Monday- will give IVFs and recheck- double checked with Pharmacy- not on anything that could cause renal impairment.  ?  ? ?ROS:  ? ?Pt denies SOB, abd pain, CP, N/V/C/D, and vision changes ? ? ?Objective: ?  ?No results found. ?No results for input(s): WBC, HGB, HCT, PLT in the last 72 hours. ? ?Recent Labs  ?  09/30/21 ?0718 10/02/21 ?0701  ?NA 136 137  ?K 4.0 4.4  ?CL 104 106  ?CO2 27 24  ?GLUCOSE 93 96  ?BUN 25* 32*  ?CREATININE 1.40* 1.74*  ?CALCIUM 9.6 9.0  ? ? ? ?Intake/Output Summary (Last 24 hours) at 10/02/2021 1002 ?Last data filed at 10/02/2021 0854 ?Gross per 24 hour  ?Intake 490 ml  ?Output --  ?Net 490 ml  ?  ? ?  ? ?Physical Exam: ?Vital Signs ?Blood pressure (!) 147/76, pulse (!) 57, temperature (!) 97.5 ?F (36.4 ?C), resp. rate 18, height 5\' 2"  (1.575 m), weight 77.1 kg, SpO2 98 %. ? ? ? ? ? ? ?General: awake, alert, appropriate, supine in bed;  NAD ?HENT: conjugate gaze; oropharynx moist ?CV: regular /borderline bradycardic rate; no JVD ?Pulmonary: CTA B/L; no W/R/R- good air movement ?GI: soft, NT, ND, (+)BS ?Psychiatric: appropriate but less fluid speech ?Neurological: asphasia more noticeable today- less fluid-  ? ?Skin: (+) tenting ?   Comments: Right forehead hematoma resolving- almost gone. Right lateral ankle with resolving ecchymosis. Large bruise left forearm with skin tear that is healing--helaing   ?Neurological:  ?   Mental Status: She is alert and oriented to person, place, and time.  ?   Comments: follows basic commands. Word finding deficits.  Able to basic answer biographic questions with cues. RLE >RUE weakness with sensory deficits.   ? ?Assessment/Plan: ?1. Functional deficits which require 3+  hours per day of interdisciplinary therapy in a comprehensive inpatient rehab setting. ?Physiatrist is providing close team supervision and 24 hour management of active medical problems listed below. ?Physiatrist and rehab team continue to assess barriers to discharge/monitor patient progress toward functional and medical goals ? ?Care Tool: ? ?Bathing ?   ?Body parts bathed by patient: Right arm, Left arm, Chest, Abdomen, Front perineal area, Buttocks, Right upper leg, Left upper leg, Right lower leg, Left lower leg, Face  ?   ?  ?  ?Bathing assist Assist Level: Contact Guard/Touching assist ?  ?  ?Upper Body Dressing/Undressing ?Upper body dressing   ?What is the patient wearing?: [place] only ?   ?Upper body assist Assist Level: Supervision/Verbal cueing ?   ?Lower Body Dressing/Undressing ?Lower body dressing ? ? ?   ?What is the patient wearing?: Incontinence brief ? ?  ? ?Lower body assist Assist for lower body dressing: Minimal Assistance - Patient > 75% ?   ? ?Toileting ?Toileting    ?Toileting assist Assist for toileting: Supervision/Verbal cueing ?  ?  ?Transfers ?Chair/bed transfer ? ?Transfers assist ?   ? ?  Chair/bed transfer assist level: Minimal Assistance - Patient > 75% ?  ?  ?Locomotion ?Ambulation ? ? ?Ambulation assist ? ?   ? ?Assist level: Minimal Assistance - Patient > 75% ?Assistive device: Walker-rolling ?Max distance: 60  ? ?Walk 10 feet activity ? ? ?Assist ?   ? ?Assist level: Minimal Assistance - Patient > 75% ?Assistive device: Walker-rolling  ? ?Walk 50 feet activity ? ? ?Assist Walk 50 feet with 2 turns activity did not occur: Safety/medical concerns ? ?Assist level: Minimal Assistance - Patient > 75% ?Assistive device: Walker-rolling  ? ? ?Walk 150 feet activity ? ? ?Assist Walk 150 feet activity did not occur: Safety/medical concerns ? ?  ?  ?  ? ?Walk 10 feet on uneven surface  ?activity ? ? ?Assist Walk 10 feet on uneven surfaces activity did not occur: Safety/medical  concerns ? ? ?  ?   ? ?Wheelchair ? ? ? ? ?Assist Is the patient using a wheelchair?: Yes ?  ?  ? ?Wheelchair assist level: Moderate Assistance - Patient 50 - 74% ?Max wheelchair distance: 150  ? ? ?Wheelchair 50 feet with 2 turns activity ? ? ? ?Assist ? ?  ?  ? ? ?Assist Level: Moderate Assistance - Patient 50 - 74%  ? ?Wheelchair 150 feet activity  ? ? ? ?Assist ?   ? ? ?Assist Level: Moderate Assistance - Patient 50 - 74%  ? ?Blood pressure (!) 147/76, pulse (!) 57, temperature (!) 97.5 ?F (36.4 ?C), resp. rate 18, height 5\' 2"  (1.575 m), weight 77.1 kg, SpO2 98 %. ? ?Medical Problem List and Plan: ?1. 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            -patient may shower ?            -ELOS/Goals:S 10-14 days ?            d/c 10/09/21 ? Continue CIR- PT, OT and SLP ?2.  Antithrombotics: ?-DVT/anticoagulation:  Pharmaceutical: Lovenox ?            -antiplatelet therapy: DAPT X 3 months followed by ASA alone.  ?3. Pain along dorsum of right foot: Tylenol prn. Add voltaren gel prn ? 3/29- denies pain- con't regimen prn ?4. Mood: LCSW to follow for evaluation and support.  ?            -antipsychotic agents: N/A ?5. Neuropsych: This patient is not fully capable of making decisions on her own behalf. ?6. Bruising right arm: wrapped to prevent skin tears ?7. Fluids/Electrolytes/Nutrition: Monitor I/O.  ?8. HTN: Permissive hypertension first 7 days and then start normalizing.  ?--Encourage fluid intake.  ?- continue amlodipine 5mg  daily ?3/30- will increase Norvasc to 10 mg daily for elevated BP ?9. CKD: Baseline SCr- 1.33? Avoid nephrotoxic medications. Current creatinine is 1.16. Place nursing order to encourage 6-8 glasses of water per day. ?3/28- Cr 1.40- will recheck Thursday-  ?3/30- Cr up to 1.74 and BUN up to 32- from 25- will give NS IVFs 75cc/hour x 24 hours and push fluids and recheck in AM- she needs ot drink more- no renal impairing meds ?10. Anxiety with depression: continue prozac.  ?11. Asthma/H/o  Lung Ca-stage 1: Stable on Dulera. Continue regimen.  ?12. GERD: Used PPI intermittently at home.  ?--Daily Pepcid effective at this time.   ?13. Hyperactive bladder: Will resume Enablex for Vesicare (family to bring in home med) ? 3/25 voided 4 x in last 24 hours ? 3/29- voiding- emptying.  ?  14. Glaucoma: On Cosopt.  ?15. Vitamin D deficiency: start ergocalciferol 50,000U once per week for 7 weeks.  ?16. Orthostasis: no longer with orthostasis- discontinue 5am orthostatic vital signs.  ?17. Suboptimal magnesium level: 250mg  magnesium gluconate started HS.  ?18. Insomnia ? 3/27- started trazodone 50 mg QHS per her home dose.  ? 3/29- sleeping well-  ?19. Dispo ? 3/30- can go to graduation by flying in may 2023 ? ? ?I spent a total of 39   minutes on total care today- >50% coordination of care- due to calling pharmacy and d/w nursing.  ? ? ? ? ?LOS: ?8 days ?A FACE TO FACE EVALUATION WAS PERFORMED ? ?Lezley Bedgood ?10/02/2021, 10:02 AM  ? ?  ?

## 2021-10-02 NOTE — Progress Notes (Signed)
Occupational Therapy Session Note ? ?Patient Details  ?Name: [name] ?MRN: [id number] ?Date of Birth: [birth year] ? ?Today's Date: 10/02/2021 ?OT Individual Time: 1100-1200 ?OT Individual Time Calculation (min): 60 min  ? ? ?Short Term Goals: ?Week 1:  OT Short Term Goal 1 (Week 1): STGs = LTGs ? ?Skilled Therapeutic Interventions/Progress Updates:  ?  Pt received sitting EOB ready for therapy.  Pt ambulated with RW with min A to day room gym to work on R side coordination and balance. ?Pt sat in arm chair to rest in between exercises.  Pt worked on Chief Technology Officer for support standing on airex mat working on lateral weight shifts, ant/post weight shifts, alternating heel lifts.  Standing balance with R foot on small towel sliding leg out to the side and back to focus on hip stability.  ?Seated hip add with knee squeezes on ball with extension of R knee and hip abd resisting against ball.   ?UE strength with 2# weighted dowel with shoulder flexion exercises.  ? ?Pt ambulated back to room with min with rW. Opted to sit in recliner for lunch. Belt alarm on and all needs met.  ? ?Therapy Documentation ?Precautions:  ?Precautions ?Precautions: Fall ?Precaution Comments: monitor orthostatic BPs ?Restrictions ?Weight Bearing Restrictions: No ? ?Pain: no c/o pain  ?  ?ADL: ?ADL ?Eating: Set up ?Grooming: Supervision/safety ?Where Assessed-Grooming: Sitting at sink ?Upper Body Bathing: Supervision/safety ?Where Assessed-Upper Body Bathing: Shower ?Lower Body Bathing: Contact guard ?Where Assessed-Lower Body Bathing: Shower ?Upper Body Dressing: Supervision/safety ?Where Assessed-Upper Body Dressing: Other (Comment) (toilet) ?Lower Body Dressing: Minimal assistance ?Where Assessed-Lower Body Dressing: Other (Comment) (toilet) ?Toileting: Supervision/safety ?Where Assessed-Toileting: Toilet ?Toilet Transfer: Contact guard ?Toilet Transfer Method: Ambulating ?Science writer: Grab bars ?Walk-In Shower  Transfer: Contact guard ?Walk-In Shower Transfer Method: Ambulating ?Walk-In Shower Equipment: Radio broadcast assistant, Grab bars ? ? ?Therapy/Group: Individual Therapy ? ?[place] ?10/02/2021, 12:43 PM ?

## 2021-10-02 NOTE — Progress Notes (Signed)
Physical Therapy Session Note ? ?Patient Details  ?Name: [name] ?MRN: [id number] ?Date of Birth: [birth year] ? ?Today's Date: 10/02/2021 ?PT Individual Time: 9563-8756 ?PT Individual Time Calculation (min): 73 min  ? ?Short Term Goals: ?Week 2:  PT Short Term Goal 1 (Week 2): STG=LTG based on ELOS ? ?Skilled Therapeutic Interventions/Progress Updates:  ?Pt received supine in bed with her daughter, [name], present and pt agreeable to therapy session despite reporting fatigue. Pt's daughter provided home measurement information - placed this on bathroom door for primary therapy team to reference.  ? ?Supine>sitting R EOB with supervision. Sitting EOB donned tennis shoes total assist. Short distance ~72ft ambulatory transfer EOB>w/c using RW with CGA for safety.  Transported to/from gym in w/c for time management and energy conservation due to fatigue. ? ?Sit<>stands using RW with CGA for steadying/safety throughout session.  ? ?Gait training ~59ft using RW with CGA - demos adequate gait speed with reciprocal stepping pattern and sufficient foot clearance bilaterally. ? ?Stair navigation training ascending/descending 4 steps (6" height) using B HRs reciprocal on ascent and step-to on descent leading with R LE - light min assist for balance throughout - cuing for R hand management on HR. ? ?Dynamic standing balance task via R/L lateral side stepping over hockey stick with R UE support and min assist for balance due to R lean while stepping towards L and then reliance on R UE support when stepping towards R.  ? ?Transitioned to forward/backwards stepping over hockey stick leading with R LE and using R UE support for balance - continued min assist for balance due to R lateral lean especially when stepping backwards - pt continues to rely on RUE support for balance. ? ?Progressed to 4 square stepping over hockey sticks - continues to require heavy min assist with occasional mod assist to recover balance due to  primarily posterior LOB - pt also relying on R UE support on therapist. ? ?Dynamic standing balance cross body reaching to pick up clothespin from low surface and place on basketball goal up high requiring her to turn and take a few steps, no UE support, with CGA/light min assist for balance - a few instances of minor posterior and anterior lean/LOB. ? ?Progressed to dynamic standing balance while performing 2-handed large ball toss to 2nd person assist progressed to forward/backwards stepping while tossing the ball - requires heavier min assist when stepping forward with L LE compared to R due to R anterior/posterior LOB - with fatigue pt forgets the sequencing of the task requiring step-by-step cuing. ? ?Transported back to room. L stand pivot w/c>EOB, no AD, with CGA. Pt left seated on EOB with her daughter present, bed alarm on, and meal tray set-up. ? ? ? ? ?Therapy Documentation ?Precautions:  ?Precautions ?Precautions: Fall ?Precaution Comments: monitor orthostatic BPs ?Restrictions ?Weight Bearing Restrictions: No ? ? ?Pain: ? "This foot is going to hurt me tonight" gesturing towards her R foot - pt states she doesn't know why it hurts; however, pt denies pain during session. ? ? ? ?Therapy/Group: Individual Therapy ? ?Tawana Scale , PT, DPT, NCS, CSRS ?10/02/2021, 3:31 PM  ?

## 2021-10-03 LAB — BASIC METABOLIC PANEL
Anion gap: 6 (ref 5–15)
BUN: 30 mg/dL — ABNORMAL HIGH (ref 8–23)
CO2: 24 mmol/L (ref 22–32)
Calcium: 9.5 mg/dL (ref 8.9–10.3)
Chloride: 105 mmol/L (ref 98–111)
Creatinine, Ser: 1.52 mg/dL — ABNORMAL HIGH (ref 0.44–1.00)
GFR, Estimated: 34 mL/min — ABNORMAL LOW (ref >60)
Glucose, Bld: 92 mg/dL (ref 70–99)
Potassium: 4.8 mmol/L (ref 3.5–5.1)
Sodium: 135 mmol/L (ref 135–145)

## 2021-10-03 MED ORDER — SODIUM CHLORIDE 0.9 % IV SOLN
INTRAVENOUS | Status: AC
Start: 2021-10-03 — End: 2021-10-04

## 2021-10-03 NOTE — Progress Notes (Signed)
Recreational Therapy Session Note ? ?Patient Details  ?Name: [name] ?MRN: [id number] ?Date of Birth: 05-07-1941 ?Today's Date: 10/03/2021 ?Time:  1030-12 ?Pain: no c/o ?Skilled Therapeutic Interventions/Progress Updates:  ?Pt participated in Community reintegration/outing to [place] at overall contact guard ambulatory level using RW or pushing a grocery cart. Goals focused on safe community mobility, identification & negotiation of obstacles, accessing public restroom, energy conservation techniques/education.  See outing goal sheet in shadow chart for full details. ? ? ?Therapy/Group: Community Reintegration ? ? ?Shane Melby ?10/03/2021, 8:47 AM  ?

## 2021-10-03 NOTE — Progress Notes (Signed)
Occupational Therapy Weekly Progress Note ? ?Patient Details  ?Name: [name] ?MRN: [id number] ?Date of Birth: March 28, 1941 ? ?Beginning of progress report period: September 25, 2021 ?End of progress report period: October 04, 2022 ? ?Today's Date: 10/03/2021 ?OT Individual Time: 0906-1000 ?OT Individual Time Calculation (min): 54 min  ? ? ?STGs not set initially due to predicted short LOS. Pt's LOS set longer to focus on balance and home safety.   ? ?Patient continues to demonstrate the following deficits: decreased cardiorespiratoy endurance, decreased coordination, decreased attention to right, decreased problem solving, decreased safety awareness, decreased memory, and delayed processing, and decreased standing balance, decreased postural control, hemiplegia, and decreased balance strategies and therefore will continue to benefit from skilled OT intervention to enhance overall performance with BADL and iADL. ? ?Patient progressing toward long term goals..  Continue plan of care. ? ?OT Short Term Goals ?Week 1:  OT Short Term Goal 1 (Week 1): STGs = LTGs ?Week 2:  OT Short Term Goal 1 (Week 2): Pt's LOS extended. STGs = LTGs ? ?Skilled Therapeutic Interventions/Progress Updates:  ?  Pt seen for BADL training with a focus on balance and R side awareness. Pt received sitting on toilet, connected to IV.  RN disconnected IV and it was covered with plastic to allow her to shower.  Pt undressed from toilet and then stepped into shower.  In shower, she kept using her nondominant hand to bathe herself needing occasional cues to use her Right hand instead.   ? ?Pt dried off and ambulated with min A and RW to sit at EOB to dress.  She had more difficulty fastening her bra today, able to top clasp but needed help doing other 3 placed behind her back.  Initially putting shirt on upside down and needed cue to turn shirt around.  When donning sweater she put her L arm in first and then got "stuck". Had pt try again with R hand  and pt able to do better.  ? ?With pulling up her underwear she had 1 LOB back onto bed as she had all of her weight through her heels and "fell" back onto mattress. I allowed this to happen to allow pt to feel her mistake. When she pulled up her pants, cued her to stand with knees soft and weight distributed over entire foot. Pt had no LOB.  ? ?Pt transferred to wc. Sat at sink to do oral care, dry hair, apply lotion.  ? ?Pt resting in wc with all needs met waiting to go on an outing.   ? ?Therapy Documentation ?Precautions:  ?Precautions ?Precautions: Fall ?Precaution Comments: monitor orthostatic BPs ?Restrictions ?Weight Bearing Restrictions: No ? ?Pain: ?Pain Assessment ?Pain Score: 0-No pain ?ADL: ?ADL ?Eating: Set up ?Grooming: Supervision/safety ?Where Assessed-Grooming: Sitting at sink ?Upper Body Bathing: Supervision/safety ?Where Assessed-Upper Body Bathing: Shower ?Lower Body Bathing: Contact guard ?Where Assessed-Lower Body Bathing: Shower ?Upper Body Dressing: Supervision/safety ?Where Assessed-Upper Body Dressing: Edge of bed ?Lower Body Dressing: Contact guard ?Where Assessed-Lower Body Dressing: Edge of bed ?Toileting: Supervision/safety ?Where Assessed-Toileting: Toilet ?Toilet Transfer: Contact guard ?Toilet Transfer Method: Ambulating ?Science writer: Grab bars ?Walk-In Shower Transfer: Contact guard ?Walk-In Shower Transfer Method: Ambulating ?Walk-In Shower Equipment: Radio broadcast assistant, Grab bars ? ?Therapy/Group: Individual Therapy ? ?[place] ?10/03/2021, 9:47 AM  ?

## 2021-10-03 NOTE — Progress Notes (Signed)
Speech Language Pathology Weekly Progress and Session Note ? ?Patient Details  ?Name: [name] ?MRN: [id number] ?Date of Birth: Nov 15, 1940 ? ?Beginning of progress report period: September 25, 2021 ?End of progress report period: October 03, 2021 ? ?Today's Date: 10/03/2021 ?SLP Individual Time: 4098-1191 ?SLP Individual Time Calculation (min): 30 min ? ?Short Term Goals: ?Week 1: SLP Short Term Goal 1 (Week 1): STG=LTG due to ELOS ?SLP Short Term Goal 1 - Progress (Week 1): Progressing toward goal ? ?New Short Term Goals: ?Week 2: SLP Short Term Goal 1 (Week 2): STG=LTG due to ELOS ? ?Weekly Progress Updates: ?Pt has met 0 out of 1 short term goals, STGs initially not set due to predicted short LOS. Pt's LOS extended to increase function and independence before discharge. Pt is progressing toward all long term goals which are set at Supervision level for expressive/receptive language and awareness. Pt has made consistent progress during first week due to increase awareness, attention, and ability to monitor/self-correct errors. Daughter and son have been present during some treatment sessions and educated on patient's progress and strategies to increase communicative independence. Pt and family will benefit from ongoing education and training. Continue to recommend 24/7 supervision at discharge. ? ?Intensity: Minumum of 1-2 x/day, 30 to 90 minutes ?Frequency: 3 to 5 out of 7 days ?Duration/Length of Stay: 4/6 ?Treatment/Interventions: Cognitive remediation/compensation;Cueing hierarchy;Multimodal communication approach;Speech/Language facilitation;Functional tasks;Patient/family education;Therapeutic Activities ? ? ?Daily Session ? ?Skilled Therapeutic Interventions: Pt seen for skilled ST with focus on speech and language goals, daughter present throughout. SLP providing prompts for conversational level speech, pt benefiting from overall min A cues to repair communication breakdowns and increase word finding. More  cues required this date likely due to fatigue (afternoon tx following community outing/shopping trip with PT). Pt continues to demonstrate adequate ability to ID and self-correct paraphasias and word finding trouble. Pt left in wheelchair with alarm set and daughter present for needs. Cont ST POC.   ? ?General  ?  ?Pain ?Pain Assessment ?Pain Scale: 0-10 ?Pain Score: 0-No pain ? ?Therapy/Group: Individual Therapy ? ?[name] ?10/03/2021, 3:32 PM ? ? ? ? ? ? ?

## 2021-10-03 NOTE — Progress Notes (Signed)
?                                                       PROGRESS NOTE ? ? ?Subjective/Complaints: ? ?Pt admits has been drinking ~ 2 cups/water daily- doesn't like water- will drink other stuff, but hates milk and doesn't love juice.  ?Went over 1 caffeine drink/day max right now.  ? ?LBM yesterday ? ?ROS:  ? ? ?Pt denies SOB, abd pain, CP, N/V/C/D, and vision changes ? ? ?Objective: ?  ?No results found. ?No results for input(s): WBC, HGB, HCT, PLT in the last 72 hours. ? ?Recent Labs  ?  10/02/21 ?0701  ?NA 137  ?K 4.4  ?CL 106  ?CO2 24  ?GLUCOSE 96  ?BUN 32*  ?CREATININE 1.74*  ?CALCIUM 9.0  ? ? ? ?Intake/Output Summary (Last 24 hours) at 10/03/2021 0848 ?Last data filed at 10/03/2021 0500 ?Gross per 24 hour  ?Intake 970 ml  ?Output --  ?Net 970 ml  ?  ? ?  ? ?Physical Exam: ?Vital Signs ?Blood pressure 126/76, pulse (!) 59, temperature (!) 97.5 ?F (36.4 ?C), temperature source Oral, resp. rate 16, height 5\' 2"  (1.575 m), weight 77.1 kg, SpO2 99 %. ? ? ? ? ? ? ? ?General: awake, alert, appropriate, sitting EOB; just voiding in bathroom; NAD ?HENT: conjugate gaze; oropharynx moist ?CV: regular rate; no JVD ?Pulmonary: CTA B/L; no W/R/R- good air movement ?GI: soft, NT, ND, (+)BS ?Psychiatric: appropriate ?Neurological: aphasia more fluid today- less word finding issues noted ?Skin: (+) tenting- not really better ?   Comments: Right forehead hematoma resolving- almost gone. Right lateral ankle with resolving ecchymosis. Large bruise left forearm with skin tear that is healing--helaing   ?Neurological:  ?   Mental Status: She is alert and oriented to person, place, and time.  ?   Comments: follows basic commands. Word finding deficits.  Able to basic answer biographic questions with cues. RLE >RUE weakness with sensory deficits.   ? ?Assessment/Plan: ?1. Functional deficits which require 3+ hours per day of interdisciplinary therapy in a comprehensive inpatient rehab setting. ?Physiatrist is providing close team  supervision and 24 hour management of active medical problems listed below. ?Physiatrist and rehab team continue to assess barriers to discharge/monitor patient progress toward functional and medical goals ? ?Care Tool: ? ?Bathing ?   ?Body parts bathed by patient: Right arm, Left arm, Chest, Abdomen, Front perineal area, Buttocks, Right upper leg, Left upper leg, Right lower leg, Left lower leg, Face  ?   ?  ?  ?Bathing assist Assist Level: Contact Guard/Touching assist ?  ?  ?Upper Body Dressing/Undressing ?Upper body dressing   ?What is the patient wearing?: [place] only ?   ?Upper body assist Assist Level: Supervision/Verbal cueing ?   ?Lower Body Dressing/Undressing ?Lower body dressing ? ? ?   ?What is the patient wearing?: Incontinence brief ? ?  ? ?Lower body assist Assist for lower body dressing: Minimal Assistance - Patient > 75% ?   ? ?Toileting ?Toileting    ?Toileting assist Assist for toileting: Supervision/Verbal cueing ?  ?  ?Transfers ?Chair/bed transfer ? ?Transfers assist ?   ? ?Chair/bed transfer assist level: Contact Guard/Touching assist ?Chair/bed transfer assistive device: Walker ?  ?Locomotion ?Ambulation ? ? ?Ambulation assist ? ?   ? ?Assist  level: Contact Guard/Touching assist ?Assistive device: Walker-rolling ?Max distance: 43ft  ? ?Walk 10 feet activity ? ? ?Assist ?   ? ?Assist level: Minimal Assistance - Patient > 75% ?Assistive device: Walker-rolling  ? ?Walk 50 feet activity ? ? ?Assist Walk 50 feet with 2 turns activity did not occur: Safety/medical concerns ? ?Assist level: Minimal Assistance - Patient > 75% ?Assistive device: Walker-rolling  ? ? ?Walk 150 feet activity ? ? ?Assist Walk 150 feet activity did not occur: Safety/medical concerns ? ?  ?  ?  ? ?Walk 10 feet on uneven surface  ?activity ? ? ?Assist Walk 10 feet on uneven surfaces activity did not occur: Safety/medical concerns ? ? ?  ?   ? ?Wheelchair ? ? ? ? ?Assist Is the patient using a wheelchair?: Yes ?  ?   ? ?Wheelchair assist level: Moderate Assistance - Patient 50 - 74% ?Max wheelchair distance: 150  ? ? ?Wheelchair 50 feet with 2 turns activity ? ? ? ?Assist ? ?  ?  ? ? ?Assist Level: Moderate Assistance - Patient 50 - 74%  ? ?Wheelchair 150 feet activity  ? ? ? ?Assist ?   ? ? ?Assist Level: Moderate Assistance - Patient 50 - 74%  ? ?Blood pressure 126/76, pulse (!) 59, temperature (!) 97.5 ?F (36.4 ?C), temperature source Oral, resp. rate 16, height 5\' 2"  (1.575 m), weight 77.1 kg, SpO2 99 %. ? ?Medical Problem List and Plan: ?1. 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            -patient may shower ?            -ELOS/Goals:S 10-14 days ?            d/c 10/09/21 ? Continue CIR- PT, OT and SLP ?2.  Antithrombotics: ?-DVT/anticoagulation:  Pharmaceutical: Lovenox ?            -antiplatelet therapy: DAPT X 3 months followed by ASA alone.  ?3. Pain along dorsum of right foot: Tylenol prn. Add voltaren gel prn ? 3/29- denies pain- con't regimen prn ?4. Mood: LCSW to follow for evaluation and support.  ?            -antipsychotic agents: N/A ?5. Neuropsych: This patient is not fully capable of making decisions on her own behalf. ?6. Bruising right arm: wrapped to prevent skin tears ?7. Fluids/Electrolytes/Nutrition: Monitor I/O.  ?8. HTN: Permissive hypertension first 7 days and then start normalizing.  ?--Encourage fluid intake.  ?- continue amlodipine 5mg  daily ?3/31- BP better with increas ei nnorvasc already- con't regimen ?9. CKD: Baseline SCr- 1.33? Avoid nephrotoxic medications. Current creatinine is 1.16. Place nursing order to encourage 6-8 glasses of water per day. ?3/28- Cr 1.40- will recheck Thursday-  ?3/30- Cr up to 1.74 and BUN up to 32- from 25- will give NS IVFs 75cc/hour x 24 hours and push fluids and recheck in AM- she needs ot drink more- no renal impairing meds ? 3/31- drinking 2 cups/day- encouraged 6-8 cups/day- waiting for labs results for change/new Cr/BUN ?10. Anxiety with depression:  continue prozac.  ?11. Asthma/H/o Lung Ca-stage 1: Stable on Dulera. Continue regimen.  ?12. GERD: Used PPI intermittently at home.  ?--Daily Pepcid effective at this time.   ?13. Hyperactive bladder: Will resume Enablex for Vesicare (family to bring in home med) ? 3/25 voided 4 x in last 24 hours ? 3/29- voiding- emptying.  ?14. Glaucoma: On Cosopt.  ?15. Vitamin D deficiency: start ergocalciferol 50,000U  once per week for 7 weeks.  ?16. Orthostasis: no longer with orthostasis- discontinue 5am orthostatic vital signs.  ?17. Suboptimal magnesium level: 250mg  magnesium gluconate started HS.  ?18. Insomnia ? 3/27- started trazodone 50 mg QHS per her home dose.  ? 3/29- sleeping well-  ?19. Dispo ? 3/30- can go to graduation by flying in may 2023 ? ?I spent a total of  37  minutes on total care today- >50% coordination of care- due to d/w rec therapy and nursing about IVFs.  ? ? ? ?LOS: ?9 days ?A FACE TO FACE EVALUATION WAS PERFORMED ? ?Tameia Rafferty ?10/03/2021, 8:48 AM  ? ?  ?

## 2021-10-03 NOTE — Progress Notes (Signed)
Recreational Therapy Discharge Summary ?Patient Details  ?Name: [name] ?MRN: [id number] ?Date of Birth: [birth year] ?Today's Date: 10/03/2021 ? ?Long term goals set: 1 ?Long term goals met: 1 ? ?Comments on progress toward goals: Pt has made good progress during LOS and is ready for discharge home 4/6.  TR sessions focused on pt education, activity analysis/modifications, wellness, leisure education and community reintegration. Goal met. ? ?Reasons goals not met: n/a ? ?Equipment acquired: n/a ? ?Reasons for discharge: discharge from hospital ? ? ?Patient/family agrees with progress made and goals achieved: Yes ? ?Arvis Miguez ?10/03/2021, 8:48 AM ? ? ?

## 2021-10-03 NOTE — Progress Notes (Addendum)
Physical Therapy Session Note ? ?Patient Details  ?Name: [name] ?MRN: [id number] ?Date of Birth: 1941-05-22 ? ?Today's Date: 10/03/2021 ?Concurrent therapy: 1030-1205 (95 mins) ? ?Short Term Goals: ?Week 2:  PT Short Term Goal 1 (Week 2): STG=LTG based on ELOS ? ?Skilled Therapeutic Interventions/Progress Updates:  ?  Patient participating in community outing to [employer]. She had specific goals of ambulating at least the length of 1 aisle and naming 3 objects and their functions. Discussing energy conservation techniques, negotiation and identification of obstacles and access to public restrooms. She was able to ambulate with both the RW and with the shopping cart to complete dual cog task with CGA. With fatigue, patient requiring up to Max verbal cues to attend to R hemibody and visual field with noted poor awareness of walking into aisle walls. Patient able to approach store employee to ask where rubber bands were with noted word finding difficulties. Patient safely returning to hospital, remaining up in chair, needs within reach.  ? ?Therapy Documentation ?Precautions:  ?Precautions ?Precautions: Fall ?Precaution Comments: monitor orthostatic BPs ?Restrictions ?Weight Bearing Restrictions: No ? ? ? ? ?Therapy/Group:  Concurrent ? ?Debbora Dus ?Debbora Dus, PT, DPT, CBIS ? ?10/03/2021, 7:51 AM  ?

## 2021-10-04 NOTE — Progress Notes (Signed)
?                                                       PROGRESS NOTE ? ? ?Subjective/Complaints: ? ?Pt trying to drink more  ? ?ROS:  ? ? ?Pt denies SOB, abd pain, CP, N/V/C/D, and vision changes ? ? ?Objective: ?  ?No results found. ?No results for input(s): WBC, HGB, HCT, PLT in the last 72 hours. ? ?Recent Labs  ?  10/02/21 ?0701 10/03/21 ?5573  ?NA 137 135  ?K 4.4 4.8  ?CL 106 105  ?CO2 24 24  ?GLUCOSE 96 92  ?BUN 32* 30*  ?CREATININE 1.74* 1.52*  ?CALCIUM 9.0 9.5  ? ? ? ? ?Intake/Output Summary (Last 24 hours) at 10/04/2021 1231 ?Last data filed at 10/04/2021 0845 ?Gross per 24 hour  ?Intake 1006.98 ml  ?Output --  ?Net 1006.98 ml  ? ?  ? ?  ? ?Physical Exam: ?Vital Signs ?Blood pressure (!) 153/77, pulse (!) 51, temperature 97.7 ?F (36.5 ?C), temperature source Oral, resp. rate 16, height 5\' 2"  (1.575 m), weight 77.1 kg, SpO2 100 %. ? ?General: No acute distress ?Mood and affect are appropriate ?Heart: Regular rate and rhythm no rubs murmurs or extra sounds ?Lungs: Clear to auscultation, breathing unlabored, no rales or wheezes ?Abdomen: Positive bowel sounds, soft nontender to palpation, nondistended ?Extremities: No clubbing, cyanosis, or edema ? ? ?Skin: (+) tenting- not really better ?   Comments: Right forehead hematoma resolving- almost gone. Right lateral ankle with resolving ecchymosis. Large bruise left forearm with skin tear that is healing--helaing   ?Neurological:  ?   Mental Status: She is alert and oriented to person, place, and time.  ?   Comments: follows basic commands. Word finding deficits.  Able to basic answer biographic questions with cues. RLE >RUE weakness with sensory deficits.   ?Pt is Apraxic RIght side , strength 4/5 R and 5/5 Left ?Assessment/Plan: ?1. Functional deficits which require 3+ hours per day of interdisciplinary therapy in a comprehensive inpatient rehab setting. ?Physiatrist is providing close team supervision and 24 hour management of active medical problems listed  below. ?Physiatrist and rehab team continue to assess barriers to discharge/monitor patient progress toward functional and medical goals ? ?Care Tool: ? ?Bathing ?   ?Body parts bathed by patient: Right arm, Left arm, Chest, Abdomen, Front perineal area, Buttocks, Right upper leg, Left upper leg, Right lower leg, Left lower leg, Face  ?   ?  ?  ?Bathing assist Assist Level: Contact Guard/Touching assist ?  ?  ?Upper Body Dressing/Undressing ?Upper body dressing   ?What is the patient wearing?: [place] only ?   ?Upper body assist Assist Level: Supervision/Verbal cueing ?   ?Lower Body Dressing/Undressing ?Lower body dressing ? ? ?   ?What is the patient wearing?: Incontinence brief ? ?  ? ?Lower body assist Assist for lower body dressing: Minimal Assistance - Patient > 75% ?   ? ?Toileting ?Toileting    ?Toileting assist Assist for toileting: Supervision/Verbal cueing ?  ?  ?Transfers ?Chair/bed transfer ? ?Transfers assist ?   ? ?Chair/bed transfer assist level: Contact Guard/Touching assist ?Chair/bed transfer assistive device: Walker ?  ?Locomotion ?Ambulation ? ? ?Ambulation assist ? ?   ? ?Assist level: Contact Guard/Touching assist ?Assistive device: Walker-rolling ?Max distance: 28ft  ? ?  Walk 10 feet activity ? ? ?Assist ?   ? ?Assist level: Minimal Assistance - Patient > 75% ?Assistive device: Walker-rolling  ? ?Walk 50 feet activity ? ? ?Assist Walk 50 feet with 2 turns activity did not occur: Safety/medical concerns ? ?Assist level: Minimal Assistance - Patient > 75% ?Assistive device: Walker-rolling  ? ? ?Walk 150 feet activity ? ? ?Assist Walk 150 feet activity did not occur: Safety/medical concerns ? ?  ?  ?  ? ?Walk 10 feet on uneven surface  ?activity ? ? ?Assist Walk 10 feet on uneven surfaces activity did not occur: Safety/medical concerns ? ? ?  ?   ? ?Wheelchair ? ? ? ? ?Assist Is the patient using a wheelchair?: Yes ?  ?  ? ?Wheelchair assist level: Moderate Assistance - Patient 50 -  74% ?Max wheelchair distance: 150  ? ? ?Wheelchair 50 feet with 2 turns activity ? ? ? ?Assist ? ?  ?  ? ? ?Assist Level: Moderate Assistance - Patient 50 - 74%  ? ?Wheelchair 150 feet activity  ? ? ? ?Assist ?   ? ? ?Assist Level: Moderate Assistance - Patient 50 - 74%  ? ?Blood pressure (!) 153/77, pulse (!) 51, temperature 97.7 ?F (36.5 ?C), temperature source Oral, resp. rate 16, height 5\' 2"  (1.575 m), weight 77.1 kg, SpO2 100 %. ? ?Medical Problem List and Plan: ?1. 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            -patient may shower ?            -ELOS/Goals:S 10-14 days ?            d/c 10/09/21 ? Continue CIR- PT, OT and SLP ?2.  Antithrombotics: ?-DVT/anticoagulation:  Pharmaceutical: Lovenox ?            -antiplatelet therapy: DAPT X 3 months followed by ASA alone.  ?3. Pain along dorsum of right foot: Tylenol prn. Add voltaren gel prn ? 3/29- denies pain- con't regimen prn ?4. Mood: LCSW to follow for evaluation and support.  ?            -antipsychotic agents: N/A ?5. Neuropsych: This patient is not fully capable of making decisions on her own behalf. ?6. Bruising right arm: wrapped to prevent skin tears ?7. Fluids/Electrolytes/Nutrition: Monitor I/O.  ?8. HTN: Permissive hypertension first 7 days and then start normalizing.  ?--Encourage fluid intake.  ?- continue amlodipine 5mg  daily ?3/31- BP better with increas ei nnorvasc already- con't regimen ?9. CKD: Baseline SCr- 1.33? Avoid nephrotoxic medications. Current creatinine is 1.16. Place nursing order to encourage 6-8 glasses of water per day. ?3/28- Cr 1.40- will recheck Thursday-  ?3/30- Cr up to 1.74 and BUN up to 32- from 25- will give NS IVFs 75cc/hour x 24 hours and push fluids and recheck in AM- she needs ot drink more- no renal impairing meds ? 3/31- drinking 2 cups/day- encouraged 6-8 cups/day- waiting for labs results for change/new Cr/BUN ?10. Anxiety with depression: continue prozac.  ?11. Asthma/H/o Lung Ca-stage 1: Stable on  Dulera. Continue regimen.  ?12. GERD: Used PPI intermittently at home.  ?--Daily Pepcid effective at this time.   ?13. Hyperactive bladder: Will resume Enablex for Vesicare (family to bring in home med) ? 3/25 voided 4 x in last 24 hours ? 3/29- voiding- emptying.  ?14. Glaucoma: On Cosopt.  ?15. Vitamin D deficiency: start ergocalciferol 50,000U once per week for 7 weeks.  ?16. Orthostasis: no longer with  orthostasis- discontinue 5am orthostatic vital signs.  ?17. Suboptimal magnesium level: 250mg  magnesium gluconate started HS.  ?18. Insomnia ? 3/27- started trazodone 50 mg QHS per her home dose.  ? 3/29- sleeping well-  ? ? ? ? ? ? ?LOS: ?10 days ?A FACE TO FACE EVALUATION WAS PERFORMED ? ?Luanna Salk Vianney Kopecky ?10/04/2021, 12:31 PM  ? ?  ?

## 2021-10-05 NOTE — Progress Notes (Signed)
Physical Therapy Session Note ? ?Patient Details  ?Name: [name] ?MRN: [id number] ?Date of Birth: [birth year] ? ?Today's Date: 10/05/2021 ?PT Individual Time: 1030-1104 ?PT Individual Time Calculation (min): 34 min  ? ?Short Term Goals: ?Week 1:  PT Short Term Goal 1 (Week 1): STG=LTG due to ELOS ?PT Short Term Goal 1 - Progress (Week 1): Progressing toward goal ?Week 2:  PT Short Term Goal 1 (Week 2): STG=LTG based on ELOS ? ?Skilled Therapeutic Interventions/Progress Updates:  ?Pt resting in bed.  She denied pain, but stated that the top of her R foot hurts intermittently.  No edema or erythema noted.  Supine> sit with supervision.  ? ?Pt donned bil shoes with supervision. With set up  Sit> stand with CGA, HHA.   ? ?neuromuscular re-education via demo and multimodal cues,  standing at sink- 5 x heel/toe raises.  Pt noted to have excess R ankle supination at times with heel raises, due to weakness/lack of awareness. 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   Heel raises resulted in dorsal R foot pain, so this movement stopped. Seated- 20 x 1 bil scapular retraction, 5 x 1 long arc quad knee extension with ankle pumps at end range. ? ?Gait training with RW on level tile, x 150' with multiple turns, CGa and mod cues for wider BOS, forward gaze and upright trunk.  ? ?At end of session, pt resting in recliner with back reclined and LEs elevated, needs at hand and seat belt alarm set.  Pt would benefit from a trial of R heel lift, lateral edge with slight post, to decrease over -supination and ankle roll-over. ? ?  ?   ? ?Therapy Documentation ?Precautions:  ?Precautions ?Precautions: Fall ?Precaution Comments: monitor orthostatic BPs ?Restrictions ?Weight Bearing Restrictions: No ?  ? ? ? ?Therapy/Group: Individual Therapy ? ?Trellis Vanoverbeke ?10/05/2021, 12:28 PM  ?

## 2021-10-06 LAB — CBC
HCT: 33.1 % — ABNORMAL LOW (ref 36.0–46.0)
Hemoglobin: 11.1 g/dL — ABNORMAL LOW (ref 12.0–15.0)
MCH: 31.9 pg (ref 26.0–34.0)
MCHC: 33.5 g/dL (ref 30.0–36.0)
MCV: 95.1 fL (ref 80.0–100.0)
Platelets: 279 10*3/uL (ref 150–400)
RBC: 3.48 MIL/uL — ABNORMAL LOW (ref 3.87–5.11)
RDW: 14 % (ref 11.5–15.5)
WBC: 4.1 10*3/uL (ref 4.0–10.5)
nRBC: 0 % (ref 0.0–0.2)

## 2021-10-06 LAB — BASIC METABOLIC PANEL
Anion gap: 7 (ref 5–15)
BUN: 22 mg/dL (ref 8–23)
CO2: 24 mmol/L (ref 22–32)
Calcium: 9.6 mg/dL (ref 8.9–10.3)
Chloride: 104 mmol/L (ref 98–111)
Creatinine, Ser: 1.34 mg/dL — ABNORMAL HIGH (ref 0.44–1.00)
GFR, Estimated: 40 mL/min — ABNORMAL LOW (ref >60)
Glucose, Bld: 116 mg/dL — ABNORMAL HIGH (ref 70–99)
Potassium: 3.8 mmol/L (ref 3.5–5.1)
Sodium: 135 mmol/L (ref 135–145)

## 2021-10-06 NOTE — Progress Notes (Signed)
?                                                       PROGRESS NOTE ? ? ?Subjective/Complaints: ? ?Pt reports trying to drink more.  ?Wants to wash up- had an OK weekend.  ?LB< yesterday,  ?No pain except R foot- aching pain.  ? ?  ? ?ROS:  ? ?Pt denies SOB, abd pain, CP, N/V/C/D, and vision changes ? ? ?Objective: ?  ?No results found. ?Recent Labs  ?  10/06/21 ?0712  ?WBC 4.1  ?HGB 11.1*  ?HCT 33.1*  ?PLT 279  ? ? ?Recent Labs  ?  10/06/21 ?0712  ?NA 135  ?K 3.8  ?CL 104  ?CO2 24  ?GLUCOSE 116*  ?BUN 22  ?CREATININE 1.34*  ?CALCIUM 9.6  ? ? ? ?Intake/Output Summary (Last 24 hours) at 10/06/2021 1912 ?Last data filed at 10/06/2021 1809 ?Gross per 24 hour  ?Intake 597 ml  ?Output --  ?Net 597 ml  ?  ? ?  ? ?Physical Exam: ?Vital Signs ?Blood pressure 126/66, pulse 60, temperature 98.2 ?F (36.8 ?C), temperature source Oral, resp. rate 18, height 5\' 2"  (1.575 m), weight 77.1 kg, SpO2 100 %. ? ? ? ?General: awake, alert, appropriate, sitting up in bed; NAD ?HENT: conjugate gaze; oropharynx dry ?CV: regular rate; no JVD ?Pulmonary: CTA B/L; no W/R/R- good air movement ?GI: soft, NT, ND, (+)BS ?Psychiatric: appropriate ?Neurological: Ox3 ?Skin: (+) tenting- but much better ?   Comments: Right forehead hematoma resolving- almost gone. Right lateral ankle with resolving ecchymosis. Large bruise left forearm with skin tear that is healing--helaing   ?Neurological:  ?   Mental Status: She is alert and oriented to person, place, and time.  ?   Comments: follows basic commands. Word finding deficits.  Able to basic answer biographic questions with cues. RLE >RUE weakness with sensory deficits.   ?Pt is Apraxic RIght side , strength 4/5 R and 5/5 Left ?Assessment/Plan: ?1. Functional deficits which require 3+ hours per day of interdisciplinary therapy in a comprehensive inpatient rehab setting. ?Physiatrist is providing close team supervision and 24 hour management of active medical problems listed below. ?Physiatrist and rehab  team continue to assess barriers to discharge/monitor patient progress toward functional and medical goals ? ?Care Tool: ? ?Bathing ?   ?Body parts bathed by patient: Right arm, Left arm, Chest, Abdomen, Front perineal area, Buttocks, Right upper leg, Left upper leg, Right lower leg, Left lower leg, Face  ?   ?  ?  ?Bathing assist Assist Level: Contact Guard/Touching assist ?  ?  ?Upper Body Dressing/Undressing ?Upper body dressing   ?What is the patient wearing?: [place] only ?   ?Upper body assist Assist Level: Supervision/Verbal cueing ?   ?Lower Body Dressing/Undressing ?Lower body dressing ? ? ?   ?What is the patient wearing?: Incontinence brief ? ?  ? ?Lower body assist Assist for lower body dressing: Minimal Assistance - Patient > 75% ?   ? ?Toileting ?Toileting    ?Toileting assist Assist for toileting: Supervision/Verbal cueing ?  ?  ?Transfers ?Chair/bed transfer ? ?Transfers assist ?   ? ?Chair/bed transfer assist level: Contact Guard/Touching assist ?Chair/bed transfer assistive device: Walker ?  ?Locomotion ?Ambulation ? ? ?Ambulation assist ? ?   ? ?Assist level: Contact Guard/Touching assist ?  Assistive device: Walker-rolling ?Max distance: 150  ? ?Walk 10 feet activity ? ? ?Assist ?   ? ?Assist level: Contact Guard/Touching assist ?Assistive device: Walker-rolling  ? ?Walk 50 feet activity ? ? ?Assist Walk 50 feet with 2 turns activity did not occur: Safety/medical concerns ? ?Assist level: Contact Guard/Touching assist ?Assistive device: Walker-rolling  ? ? ?Walk 150 feet activity ? ? ?Assist Walk 150 feet activity did not occur: Safety/medical concerns ? ?Assist level: Contact Guard/Touching assist ?Assistive device: Walker-rolling ?  ? ?Walk 10 feet on uneven surface  ?activity ? ? ?Assist Walk 10 feet on uneven surfaces activity did not occur: Safety/medical concerns ? ? ?  ?   ? ?Wheelchair ? ? ? ? ?Assist Is the patient using a wheelchair?: Yes ?  ?  ? ?Wheelchair assist level: Moderate  Assistance - Patient 50 - 74% ?Max wheelchair distance: 150  ? ? ?Wheelchair 50 feet with 2 turns activity ? ? ? ?Assist ? ?  ?  ? ? ?Assist Level: Moderate Assistance - Patient 50 - 74%  ? ?Wheelchair 150 feet activity  ? ? ? ?Assist ?   ? ? ?Assist Level: Moderate Assistance - Patient 50 - 74%  ? ?Blood pressure 126/66, pulse 60, temperature 98.2 ?F (36.8 ?C), temperature source Oral, resp. rate 18, height 5\' 2"  (1.575 m), weight 77.1 kg, SpO2 100 %. ? ?Medical Problem List and Plan: ?1. 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            -patient may shower ?            -ELOS/Goals:S 10-14 days ?            d/c 10/09/21 ? Continue CIR- PT, OT and SLP- team conference tomorrow to f/ou on progress ?2.  Antithrombotics: ?-DVT/anticoagulation:  Pharmaceutical: Lovenox ?            -antiplatelet therapy: DAPT X 3 months followed by ASA alone.  ?3. Pain along dorsum of right foot: Tylenol prn. Add voltaren gel prn ? 3/29- denies pain- con't regimen prn ? 4/3- still having aching pai in R foot- con't tylenol prn ?4. Mood: LCSW to follow for evaluation and support.  ?            -antipsychotic agents: N/A ?5. Neuropsych: This patient is not fully capable of making decisions on her own behalf. ?6. Bruising right arm: wrapped to prevent skin tears ?7. Fluids/Electrolytes/Nutrition: Monitor I/O.  ?8. HTN: Permissive hypertension first 7 days and then start normalizing.  ?--Encourage fluid intake.  ?- continue amlodipine 5mg  daily ?3/31- BP better with increas ei nnorvasc already- con't regimen ?4/3- BP controlled- con't regimen ?9. CKD: Baseline SCr- 1.33? Avoid nephrotoxic medications. Current creatinine is 1.16. Place nursing order to encourage 6-8 glasses of water per day. ?3/28- Cr 1.40- will recheck Thursday-  ?3/30- Cr up to 1.74 and BUN up to 32- from 25- will give NS IVFs 75cc/hour x 24 hours and push fluids and recheck in AM- she needs ot drink more- no renal impairing meds ? 3/31- drinking 2 cups/day-  encouraged 6-8 cups/day- waiting for labs results for change/new Cr/BUN ?4/3- Cr down to 1.34 and BUN down to 22- less dry- con't PO intake ?10. Anxiety with depression: continue prozac.  ?11. Asthma/H/o Lung Ca-stage 1: Stable on Dulera. Continue regimen.  ?12. GERD: Used PPI intermittently at home.  ?--Daily Pepcid effective at this time.   ?13. Hyperactive bladder: Will resume Enablex for Vesicare (family  to bring in home med) ? 3/25 voided 4 x in last 24 hours ? 3/29- voiding- emptying.  ?14. Glaucoma: On Cosopt.  ?15. Vitamin D deficiency: start ergocalciferol 50,000U once per week for 7 weeks.  ?16. Orthostasis: no longer with orthostasis- discontinue 5am orthostatic vital signs.  ?17. Suboptimal magnesium level: 250mg  magnesium gluconate started HS.  ?18. Insomnia ? 3/27- started trazodone 50 mg QHS per her home dose.  ? 3/29- sleeping well-  ? ? ? ? ? ? ?LOS: ?12 days ?A FACE TO FACE EVALUATION WAS PERFORMED ? ?[name] ?10/06/2021, 7:12 PM  ? ?  ?

## 2021-10-06 NOTE — Progress Notes (Signed)
Physical Therapy Session Note ? ?Patient Details  ?Name: NANNETTE ZILL ?MRN: [id number] ?Date of Birth: [birth year] ? ?Today's Date: 10/06/2021 ?PT Individual Time: 1030-1055 ?PT Individual Time Calculation (min): 25 min  ? ?Short Term Goals: ?Week 2:  PT Short Term Goal 1 (Week 2): STG=LTG based on ELOS ? ?Skilled Therapeutic Interventions/Progress Updates:  ?  Patient received sitting up in bed, agreeable to PT. She denies pain. Patient coming to sit edge of bed with supervision. Donning socks with supervision. She was able to get dressed with supervision and set up A + verbal cues to attend to R UE in shirt. Patient completing morning ADLs standing at sink with RW + close supervision. Ambulating into bathroom with RW and CGA. Continent void. Patient remaining up in recliner, seatbelt alarm on, call light within reach.  ?Therapy Documentation ?Precautions:  ?Precautions ?Precautions: Fall ?Precaution Comments: monitor orthostatic BPs ?Restrictions ?Weight Bearing Restrictions: No ? ? ? ?Therapy/Group: Individual Therapy ? ?Debbora Dus ?Debbora Dus, PT, DPT, CBIS ? ?10/06/2021, 7:40 AM  ?

## 2021-10-06 NOTE — Progress Notes (Signed)
Occupational Therapy Session Note ? ?Patient Details  ?Name: [name] ?MRN: [id number] ?Date of Birth: September 29, 1940 ? ?Today's Date: 10/06/2021 ?OT Group Time: 1833-5825 ?OT Group Time Calculation (min): 60 min ? ? ?Short Term Goals: ?Week 1:  OT Short Term Goal 1 (Week 1): STGs = LTGs ? ?Skilled Therapeutic Interventions/Progress Updates:  ?Pt participated in group session with a focus on stress mgmt, education on healthy coping strategies, and social interaction. Focus of session on providing coping strategies to manage new diagnosis to allow for improved mental health to increase overall quality of life . Discussed how to break down stressors into ?daily hassles,? ?major life stressors? and ?life circumstances? in an effort to allow pts to chunk their stressors into groups. Pt actively sharing stressors and contributing to group conversation with increased time and effort d/t expressive aphasia.  Provided active listening, emotional support and therapeutic use of self. Offered education on factors that protect [place] against stress such as ?daily uplifts,? ?healthy coping strategies? and ?protective factors.? Encouraged all group members to make an effort to actively recall one event from their day that was a daily uplift in an effort to protect their mindset from stressors as well as sharing this information with their caregivers to facilitate improved caregiver communication and decrease overall burden of care.  Issued pt handouts on healthy coping strategies to implement into routine. Pt transported back to room by OTR. ? ?Therapy Documentation ?Precautions:  ?Precautions ?Precautions: Fall ?Precaution Comments: monitor orthostatic BPs ?Restrictions ?Weight Bearing Restrictions: No ? ? ?Pain: no pain reported during session  ? ?Therapy/Group: Group Therapy ? ?Precious Haws ?10/06/2021, 3:37 PM ?

## 2021-10-06 NOTE — Progress Notes (Signed)
Speech Language Pathology Daily Session Note ? ?Patient Details  ?Name: [name] ?MRN: [id number] ?Date of Birth: [birth year] ? ?Today's Date: 10/06/2021 ?SLP Individual Time: 0973-5329 ?SLP Individual Time Calculation (min): 58 min ? ?Short Term Goals: ?Week 2: SLP Short Term Goal 1 (Week 2): STG=LTG due to ELOS ? ?Skilled Therapeutic Interventions:Skilled ST services focused on language skills. SLP facilitated word finding in structured convergent and divergent tasks, pt required supervision A semantic cues in convergent naming tasks and mod A subcategories in divergent naming task. Pt demonstrated ability to increase naming members in a category from 4-5 to 7-9 with cues. Pt demonstrated increase self-correction of verbal errors in conversation with min A semantic cues. Pt was left in room with call bell within reach and chair alarm set. SLP recommends to continue skilled services. ?   ? ?Pain ?Pain Assessment ?Pain Scale: 0-10 ?Pain Score: 0-No pain ? ?Therapy/Group: Individual Therapy ? ?Morene Cecilio ?10/06/2021, 1:48 PM ?

## 2021-10-07 NOTE — Plan of Care (Signed)
?  Problem: RH Light Housekeeping ?Goal: LTG Patient will perform light housekeeping w/assist (OT) ?Description: LTG: Patient will perform light housekeeping with assistance, with/without cues (OT). ?Outcome: Not Met (add Reason) ?Note: Pt will need min -mod A with her IADLS due to R inattention, balance and coordination deficits.  ?  ?Problem: RH Tub/Shower Transfers ?Goal: LTG Patient will perform tub/shower transfers w/assist (OT) ?Description: LTG: Patient will perform tub/shower transfers with assist, with/without cues using equipment (OT) ?Outcome: Not Met (add Reason) ?Note: Pt will need CGA due to R inattention, balance and coordination deficits.  ?  ?

## 2021-10-07 NOTE — Progress Notes (Signed)
Occupational Therapy Discharge Summary ? ?Patient Details  ?Name: [name] ?MRN: [id number] ?Date of Birth: 1941/03/28 ? ? ?Patient has met 6 of 8 long term goals due to improved balance, postural control, functional use of  RIGHT upper and RIGHT lower extremity, and improved coordination.  Patient to discharge at overall Supervision level.  Patient's care partner is independent to provide the necessary physical and cognitive assistance at discharge.   ? ?Family education completed with her daughter.  ? ?Reasons goals not met: Pt did not meet supervision with shower transfer and homemaking goals. She needs CGA to min A due to R inattention and balance deficits.  ? ?Recommendation:  ?Patient will benefit from ongoing skilled OT services in outpatient setting to continue to advance functional skills in the area of BADL and iADL. ? ?Equipment: ?No equipment provided- pt has needed DME ? ?Reasons for discharge: treatment goals met ? ?Patient/family agrees with progress made and goals achieved: Yes ? ?OT Discharge ?Precautions/Restrictions  ?Precautions ?Precautions: Fall ?Restrictions ?Weight Bearing Restrictions: No ? ?ADL ?ADL ?Eating: Independent ?Grooming: Supervision/safety ?Where Assessed-Grooming: Standing at sink ?Upper Body Bathing: Supervision/safety ?Where Assessed-Upper Body Bathing: Shower ?Lower Body Bathing: Supervision/safety ?Where Assessed-Lower Body Bathing: Shower ?Upper Body Dressing: Supervision/safety ?Where Assessed-Upper Body Dressing: Edge of bed ?Lower Body Dressing: Supervision/safety ?Where Assessed-Lower Body Dressing: Edge of bed ?Toileting: Supervision/safety ?Where Assessed-Toileting: Toilet ?Toilet Transfer: Close supervision ?Toilet Transfer Method: Ambulating ?Science writer: Grab bars ?Walk-In Shower Transfer: Contact guard ?Walk-In Shower Transfer Method: Ambulating ?Walk-In Shower Equipment: Radio broadcast assistant, Grab bars ?ADL Comments: Patient used walker to  gather items from closet for dressingEOB. ?Vision ?Baseline Vision/History: 1 Wears glasses ?Patient Visual Report: No change from baseline ?Vision Assessment?: Yes ?Eye Alignment: Within Functional Limits ?Ocular Range of Motion: Within Functional Limits ?Tracking/Visual Pursuits: Able to track stimulus in all quads without difficulty ?Saccades: Within functional limits ?Convergence: Within functional limits ?Visual Fields: Right visual field deficit ?Perception  ?Perception: Impaired ?Inattention/Neglect: Does not attend to right visual field ?Praxis ?Praxis: Impaired ?Praxis Impairment Details: Motor planning ?Praxis-Other Comments: intact with familiar self care tasks, but has difficulty with more complex tasks in a new environment ?Cognition ?Cognition ?Overall Cognitive Status: Impaired/Different from baseline ?Arousal/Alertness: Awake/alert ?Orientation Level: Person;Place;Situation ?Person: Oriented ?Place: Oriented ?Situation: Oriented ?Memory: Impaired ?Memory Impairment: Storage deficit;Retrieval deficit;Decreased recall of new information ?Attention: Selective ?Selective Attention: Appears intact ?Awareness: Impaired ?Awareness Impairment: Anticipatory impairment ?Problem Solving: Impaired ?Problem Solving Impairment: Verbal basic;Functional basic ?Safety/Judgment: Impaired ?Brief Interview for Mental Status (BIMS) ?Repetition of Three Words (First Attempt): 3 ?Temporal Orientation: Year: Correct ?Temporal Orientation: Month: Accurate within 5 days ?Temporal Orientation: Day: Correct ?Recall: "Sock": No, could not recall ?Recall: "Blue": Yes, no cue required ?Recall: "Bed": No, could not recall ?BIMS Summary Score: 11 ?Sensation ?Sensation ?Light Touch Impaired Details: Impaired RLE;Impaired RUE ?Hot/Cold: Appears Intact ?Proprioception Impaired Details: Impaired RLE ?Stereognosis: Impaired by gross assessment ?Additional Comments: R hand ?Coordination ?Gross Motor Movements are Fluid and Coordinated:  No ?Fine Motor Movements are Fluid and Coordinated: No ?Coordination and Movement Description: sensory ataxia on 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; has difficulty woth [place] with writing/manipulating pen in hand ?Finger Nose Finger Test: decreased speed on the RUE ?Motor  ?Motor ?Motor - Skilled Clinical Observations: 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 ?Motor - Discharge Observations: 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 ?Trunk/Postural Assessment  ?Cervical Assessment ?Cervical Assessment: Within Functional Limits ?Thoracic Assessment ?Thoracic Assessment: Within Functional Limits ?Lumbar Assessment ?Lumbar Assessment: Within Functional Limits ?Postural Control ?Postural Control:  Deficits on evaluation ?Trunk Control: R lean in standing with dynamic balance; increased lean with dual tasking  ?Balance ?Static Sitting Balance ?Static Sitting - Level of Assistance: 7: Independent ?Dynamic Sitting Balance ?Dynamic Sitting - Level of Assistance: 5: Stand by assistance ?Static Standing Balance ?Static Standing - Level of Assistance: 5: Stand by assistance ?Dynamic Standing Balance ?Dynamic Standing - Level of Assistance: 4: Min assist ?Extremity/Trunk Assessment ?RUE Assessment ?General Strength Comments: 4-/5 ?LUE Assessment ?LUE Assessment: Within Functional Limits ? ? ?Curtis Sites ?10/08/2021, 11:24 AM ?

## 2021-10-07 NOTE — Progress Notes (Signed)
Physical Therapy Session Note ? ?Patient Details  ?Name: QUINLEY NESLER ?MRN: [id number] ?Date of Birth: 18-May-1941 ? ?Today's Date: 10/07/2021 ?PT Individual Time: 2876-8115 ?PT Individual Time Calculation (min): 55 min  ? ?Short Term Goals: ?Week 2:  PT Short Term Goal 1 (Week 2): STG=LTG based on ELOS ? ?Skilled Therapeutic Interventions/Progress Updates:  ?  Patient received sitting up in wc, agreeable to PT. She denies pain. Patient ambulating to therapy gym with RW and supervision + verbal cues to attend to R hemibody. Patient completing R attention tasks matching playing cards while standing. Improving ability to dual task and maintain balance/ limit R lateral lean. Patient completing further R attention task with fine motor practice. Patient able to name colors while completing this task and not lose balance. Patient able to stand, weight shift R to retrieve a bean bag and toss at [employer] with close supervision. Patient ambulating back to her room with RW and close supervision. Returning to bed per patient request. Bed alarm on, call light within reach.  ? ? ?Therapy Documentation ?Precautions:  ?Precautions ?Precautions: Fall ?Precaution Comments: monitor orthostatic BPs ?Restrictions ?Weight Bearing Restrictions: No ? ? ? ? ?Therapy/Group: Individual Therapy ? ?Debbora Dus ?Debbora Dus, PT, DPT, CBIS ? ?10/07/2021, 7:56 AM  ?

## 2021-10-07 NOTE — Progress Notes (Signed)
Occupational Therapy Session Note ? ?Patient Details  ?Name: [name] ?MRN: [id number] ?Date of Birth: 04-18-1941 ? ?Today's Date: 10/07/2021 ?OT Individual Time: 0930-1030 ?OT Individual Time Calculation (min): 60 min  ? ? ?Short Term Goals: ?Week 1:  OT Short Term Goal 1 (Week 1): STGs = LTGs ?Week 2:  OT Short Term Goal 1 (Week 2): Pt's LOS extended. STGs = LTGs ? ?Skilled Therapeutic Interventions/Progress Updates:  ?  Pt received in hand off from other OT. Spent time reassessing her vision and visual perception.  She continues to have functional visual tracking, convergence, saccades.  In visual field assessment, there is a slight field cut of 30 degrees on her R side.   ?With clock draw and house picture copy test, pt did not demonstrate a neglect.  She continues to have great difficulty managing a pen with [facility] for writing so had difficulty with numbers. Cut out 12 piece of paper with 1-12 on them and pt correctly placed them around the circle.   ? ?Pt taken to ADL apartment to work on meal prep task of making tuna salad.  In this more complex task, her deficits became more prominent than when observed in self care tasks.   ?She had difficulty with: ?-increased R lean in standing with dual tasking (both hands busy opening containers) ?-right visual inattention very obvious when she could not locate cans of tuna on R side of cabinet or find paper towels that were on her R side ?-difficulty with motor planning and R hand coordination with opening can of tuna with manual opener, scooping tuna out of can. ?-decreased problem solving, instead of scooping a small amount of mayo our of jar she tried to shake it out by putting jar upside down. She also tried to cut the sandwich with the knife upside down. ?After the sandwich was made, spent time talking with pt about what I observed.  She will continue to need very CLOSE SUPERVISION at home with self care, contact guard as she steps over ledge into shower, min  -mod A with IADL tasks.  Talked to her about what skill areas she can continue to focus on at home and with therapy.   ?Pt taken back to room and opted to rest in w/c with belt alarm on.  ? ?Therapy Documentation ?Precautions:  ?Precautions ?Precautions: Fall ?Precaution Comments: monitor orthostatic BPs ?Restrictions ?Weight Bearing Restrictions: No ? ?  ?Vital Signs: ?Therapy Vitals ?Temp: (!) 97.5 ?F (36.4 ?C) ?Temp Source: Oral ?Pulse Rate: (!) 49 ?Resp: 18 ?BP: (!) 148/58 ?Patient Position (if appropriate): Lying ?Oxygen Therapy ?SpO2: 97 % ?O2 Device: Room Air ?Pain: no c/o pain ?  ?ADL: ?ADL ?Eating: Independent ?Grooming: Setup ?Where Assessed-Grooming: Sitting at sink ?Upper Body Bathing: Supervision/safety ?Where Assessed-Upper Body Bathing: Shower ?Lower Body Bathing: Contact guard ?Where Assessed-Lower Body Bathing: Shower ?Upper Body Dressing: Supervision/safety ?Where Assessed-Upper Body Dressing: Edge of bed ?Lower Body Dressing: Contact guard ?Where Assessed-Lower Body Dressing: Edge of bed ?Toileting: Supervision/safety ?Where Assessed-Toileting: Toilet ?Toilet Transfer: Contact guard ?Toilet Transfer Method: Ambulating ?Science writer: Grab bars ?Walk-In Shower Transfer: Contact guard ?Walk-In Shower Transfer Method: Ambulating ?Walk-In Shower Equipment: Radio broadcast assistant, Grab bars ? ? ?Therapy/Group: Individual Therapy ? ?[place] ?10/07/2021, 9:19 AM ?

## 2021-10-07 NOTE — Progress Notes (Signed)
Occupational Therapy Session Note ? ?Patient Details  ?Name: [name] ?MRN: [id number] ?Date of Birth: August 09, 1940 ? ?Today's Date: 10/07/2021 ?OT Individual Time: 9924-2683 ?OT Individual Time Calculation (min): 28 min  ? ? ?Short Term Goals: ?Week 2:  OT Short Term Goal 1 (Week 2): Pt's LOS extended. STGs = LTGs ? ?Skilled Therapeutic Interventions/Progress Updates:  ?   ? ?Therapy Documentation ?Precautions:  ?Precautions ?Precautions: Fall ?Precaution Comments: monitor orthostatic BPs ?Restrictions ?Weight Bearing Restrictions: No ?General: ?  ?Pain: ?  ?[id number] seen for dressing tasks. Patient able to roll to left to sit EOB without physical assist. Sat EOB supervision/Mod I. Requested walker for functional mobility activity gathering clothing from closet to dress EOB. Patient able to retrieve clothing with SBA using the walker. Cued to place clothing over center bar of walker vs carrying in hand. Able to dress at EOB with SBA, and occasional cuing to attend to right or fix clothing like removing the left sock prior to donning new sock. Following dressing patient ambulated to therapy gym for standing endurance and cognitive task. Utilized Pill Box Test for both fine motor and problem solving in standing. Patient unable to pass the test having difficulty following directions on each bottle without error. ? ?  ?  ?Balance: tolerated dynamic standing activity in room and at raised table in the therapy gym with SPV ? ? ?Therapy/Group: Individual Therapy ? ?Hermina Barters ?10/07/2021, 11:14 AM ?

## 2021-10-07 NOTE — Patient Care Conference (Signed)
Inpatient RehabilitationTeam Conference and Plan of Care Update ?Date: 10/07/2021   Time: 12:12 PM  ? ? ?Patient Name: [name]      ?Medical Record Number: [id number]  ?Date of Birth: 01-24-1941 ?Sex: Female         ?Room/Bed: 9N23F/5D32K-02 ?Payor Info: Payor: Marine scientist / Plan: [facility] MEDICARE / Product Type: *No Product type* /   ? ?Admit Date/Time:  09/24/2021  3:41 PM ? 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 ?Hospital Problems: Principal Problem: ?  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 ? ? ?Expected Discharge Date: Expected Discharge Date: 10/09/21 ? ?Team Members Present: ?Physician leading conference: Dr. Courtney Heys ?Social Worker Present: Ovidio Kin, LCSW ?Nurse Present: Dorien Chihuahua, RN ?PT Present: Estevan Ryder, PT ?OT Present: Meriel Pica, OT ?PPS Coordinator present : Gunnar Fusi, SLP ? ?   Current Status/Progress Goal Weekly Team Focus  ?Bowel/Bladder ? ?   Continent        ?Swallow/Nutrition/ Hydration ? ?           ?ADL's ? ?    Supervision overall      ?Mobility ? ? CGA overall with supervision gait household distances, R inattention worsening?  supervision  R attention R hemi coordination, pt/fam ed, dc planning, dynamic balance, gait progressions   ?Communication ? ? Min A  Supervision A  education, word finding in conversation, higher level word finding in structured task and complex commands   ?Safety/Cognition/ Behavioral Observations ? min-supervision A  Supervision A  education, verbal error awareness   ?Pain ? ?   N/A        ?Skin ? ?   N/A        ? ? ?Discharge Planning:  ?Daughter has been in for therapies and will be back tomorrow. Aware of pt's need for 24/7 supervision at DC. Daughter working on Product/process development scientist   ?Team Discussion: ?Patient appears better than actual functional level; has trouble with error awareness, word finding, structured tasks, processing for dual tasks. ? ?Patient on  target to meet rehab goals: ?yes, currently needs verbal cues for right inattention. Recommend 24/7 supervision for foreseeable future. Cognitively needs min assist with supervision goals. ? ?*See Care Plan and progress notes for long and short-term goals.  ? ?Revisions to Treatment Plan:  ?N/A ?  ?Teaching Needs: ?Safety, cues for inattention, processing, etc, medication management, etc  ?Current Barriers to Discharge: ?Decreased caregiver support ? ?Possible Resolutions to Barriers: ?Family education ?24/7 supervision recommended; ALF suggested ?Has all DME ?  ? ? Medical Summary ?Current Status: continent- no skin issues- regular diet- is drinking better- Cr 1.34 and BUN also better ? Barriers to Discharge: Decreased family/caregiver support;Home enviroment access/layout;Weight bearing restrictions;Weight;Nutrition means ? Barriers to Discharge Comments: CGA- supervision - household distances- R intattention ?Possible Resolutions to [employer]: more cues to attend to R- due to inattention- will need 24/7- needs family ed- when does complex tasks, much more difficult- lots of cues- and processing issues- cannot do dual tasks- focus on  driking/push PO SLP at min A- d/c- 4/6 ? ? ?Continued Need for Acute Rehabilitation Level of Care: The patient requires daily medical management by a physician with specialized training in physical medicine and rehabilitation for the following reasons: ?Direction of a multidisciplinary physical rehabilitation program to maximize functional independence : Yes ?Medical management of patient stability for increased activity during participation in an intensive rehabilitation regime.: Yes ?Analysis of laboratory values  and/or radiology reports with any subsequent need for medication adjustment and/or medical intervention. : Yes ? ? ?I attest that I was present, lead the team conference, and concur with the assessment and plan of the team. ? ? ?[name] ?10/07/2021, 1:45  PM  ? ? ? ? ? ? ?

## 2021-10-07 NOTE — Progress Notes (Signed)
?                                                       PROGRESS NOTE ? ? ?Subjective/Complaints: ? ?Pt reports no issues.  ?Is working on drinking well- Cr/BUN show she is drinking better- informed pt of that.  ?  ? ?ROS:  ? ?Pt denies SOB, abd pain, CP, N/V/C/D, and vision changes ? ?Objective: ?  ?No results found. ?Recent Labs  ?  10/06/21 ?0712  ?WBC 4.1  ?HGB 11.1*  ?HCT 33.1*  ?PLT 279  ? ? ?Recent Labs  ?  10/06/21 ?0712  ?NA 135  ?K 3.8  ?CL 104  ?CO2 24  ?GLUCOSE 116*  ?BUN 22  ?CREATININE 1.34*  ?CALCIUM 9.6  ? ? ? ?Intake/Output Summary (Last 24 hours) at 10/07/2021 0911 ?Last data filed at 10/06/2021 2045 ?Gross per 24 hour  ?Intake 477 ml  ?Output --  ?Net 477 ml  ?  ? ?  ? ?Physical Exam: ?Vital Signs ?Blood pressure (!) 148/58, pulse (!) 49, temperature (!) 97.5 ?F (36.4 ?C), temperature source Oral, resp. rate 18, height 5\' 2"  (1.575 m), weight 77.1 kg, SpO2 97 %. ? ? ? ? ?General: awake, alert, appropriate,sitting up in bed;  wearing wig NAD ?HENT: conjugate gaze; oropharynx moist ?CV: bradycardic rate; no JVD ?Pulmonary: CTA B/L; no W/R/R- good air movement ?GI: soft, NT, ND, (+)BS ?Psychiatric: appropriate ?Neurological: Ox3 ?Skin: (+) tenting- but much better ?   Comments: Right forehead hematoma resolving- almost gone. Right lateral ankle with resolving ecchymosis. Large bruise left forearm with skin tear that is healing--helaing   ?Neurological:  ?   Mental Status: She is alert and oriented to person, place, and time.  ?   Comments: follows basic commands. Word finding deficits.  Able to basic answer biographic questions with cues. RLE >RUE weakness with sensory deficits.   ?Pt is Apraxic RIght side , strength 4/5 R and 5/5 Left ?Assessment/Plan: ?1. Functional deficits which require 3+ hours per day of interdisciplinary therapy in a comprehensive inpatient rehab setting. ?Physiatrist is providing close team supervision and 24 hour management of active medical problems listed below. ?Physiatrist  and rehab team continue to assess barriers to discharge/monitor patient progress toward functional and medical goals ? ?Care Tool: ? ?Bathing ?   ?Body parts bathed by patient: Right arm, Left arm, Chest, Abdomen, Front perineal area, Buttocks, Right upper leg, Left upper leg, Right lower leg, Left lower leg, Face  ?   ?  ?  ?Bathing assist Assist Level: Contact Guard/Touching assist ?  ?  ?Upper Body Dressing/Undressing ?Upper body dressing   ?What is the patient wearing?: [place] only ?   ?Upper body assist Assist Level: Supervision/Verbal cueing ?   ?Lower Body Dressing/Undressing ?Lower body dressing ? ? ?   ?What is the patient wearing?: Incontinence brief ? ?  ? ?Lower body assist Assist for lower body dressing: Minimal Assistance - Patient > 75% ?   ? ?Toileting ?Toileting    ?Toileting assist Assist for toileting: Supervision/Verbal cueing ?  ?  ?Transfers ?Chair/bed transfer ? ?Transfers assist ?   ? ?Chair/bed transfer assist level: Contact Guard/Touching assist ?Chair/bed transfer assistive device: Walker ?  ?Locomotion ?Ambulation ? ? ?Ambulation assist ? ?   ? ?Assist level: Contact Guard/Touching assist ?Assistive device:  Walker-rolling ?Max distance: 150  ? ?Walk 10 feet activity ? ? ?Assist ?   ? ?Assist level: Contact Guard/Touching assist ?Assistive device: Walker-rolling  ? ?Walk 50 feet activity ? ? ?Assist Walk 50 feet with 2 turns activity did not occur: Safety/medical concerns ? ?Assist level: Contact Guard/Touching assist ?Assistive device: Walker-rolling  ? ? ?Walk 150 feet activity ? ? ?Assist Walk 150 feet activity did not occur: Safety/medical concerns ? ?Assist level: Contact Guard/Touching assist ?Assistive device: Walker-rolling ?  ? ?Walk 10 feet on uneven surface  ?activity ? ? ?Assist Walk 10 feet on uneven surfaces activity did not occur: Safety/medical concerns ? ? ?  ?   ? ?Wheelchair ? ? ? ? ?Assist Is the patient using a wheelchair?: Yes ?  ?  ? ?Wheelchair assist level:  Moderate Assistance - Patient 50 - 74% ?Max wheelchair distance: 150  ? ? ?Wheelchair 50 feet with 2 turns activity ? ? ? ?Assist ? ?  ?  ? ? ?Assist Level: Moderate Assistance - Patient 50 - 74%  ? ?Wheelchair 150 feet activity  ? ? ? ?Assist ?   ? ? ?Assist Level: Moderate Assistance - Patient 50 - 74%  ? ?Blood pressure (!) 148/58, pulse (!) 49, temperature (!) 97.5 ?F (36.4 ?C), temperature source Oral, resp. rate 18, height 5\' 2"  (1.575 m), weight 77.1 kg, SpO2 97 %. ? ?Medical Problem List and Plan: ?1. 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            -patient may shower ?            -ELOS/Goals:S 10-14 days ?            d/c 4/6 ? Cir- PT, OT and SLP ? Team conference today to finalize d/c plans.  ?2.  Antithrombotics: ?-DVT/anticoagulation:  Pharmaceutical: Lovenox ?            -antiplatelet therapy: DAPT X 3 months followed by ASA alone.  ?3. Pain along dorsum of right foot: Tylenol prn. Add voltaren gel prn ? 3/29- denies pain- con't regimen prn ? 4/3- still having aching pai in R foot- con't tylenol prn ?4. Mood: LCSW to follow for evaluation and support.  ?            -antipsychotic agents: N/A ?5. Neuropsych: This patient is not fully capable of making decisions on her own behalf. ?6. Bruising right arm: wrapped to prevent skin tears ?7. Fluids/Electrolytes/Nutrition: Monitor I/O.  ?8. HTN: Permissive hypertension first 7 days and then start normalizing.  ?--Encourage fluid intake.  ?- continue amlodipine 5mg  daily ?3/31- BP better with increase in norvasc already- con't regimen ?4/3- BP controlled- con't regimen ?9. CKD: Baseline SCr- 1.33? Avoid nephrotoxic medications. Current creatinine is 1.16. Place nursing order to encourage 6-8 glasses of water per day. ?3/28- Cr 1.40- will recheck Thursday-  ?3/30- Cr up to 1.74 and BUN up to 32- from 25- will give NS IVFs 75cc/hour x 24 hours and push fluids and recheck in AM- she needs ot drink more- no renal impairing meds ? 3/31- drinking 2  cups/day- encouraged 6-8 cups/day- waiting for labs results for change/new Cr/BUN ?4/3- Cr down to 1.34 and BUN down to 22- less dry- con't PO intake ? 4/4- con't to push fluids.  ?10. Anxiety with depression: continue prozac.  ?11. Asthma/H/o Lung Ca-stage 1: Stable on Dulera. Continue regimen.  ?12. GERD: Used PPI intermittently at home.  ?--Daily Pepcid effective at this time.   ?  13. Hyperactive bladder: Will resume Enablex for Vesicare (family to bring in home med) ? 3/25 voided 4 x in last 24 hours ? 3/29- voiding- emptying.  ?14. Glaucoma: On Cosopt.  ?15. Vitamin D deficiency: start ergocalciferol 50,000U once per week for 7 weeks.  ?16. Orthostasis: no longer with orthostasis- discontinue 5am orthostatic vital signs.  ?17. Suboptimal magnesium level: 250mg  magnesium gluconate started HS.  ?18. Insomnia ? 3/27- started trazodone 50 mg QHS per her home dose.  ? 4/4- sleeping well ? ? ?I spent a total of 35   minutes on total care today- >50% coordination of care- due to team conference and discussion about d/c plans. ? ? ? ? ? ? ? ?LOS: ?13 days ?A FACE TO FACE EVALUATION WAS PERFORMED ? ?Delson Dulworth ?10/07/2021, 9:11 AM  ? ?  ?

## 2021-10-07 NOTE — Progress Notes (Signed)
Physical Therapy Session Note ? ?Patient Details  ?Name: [name] ?MRN: [id number] ?Date of Birth: [birth year] ? ?Today's Date: 10/07/2021 ?PT Individual Time: 1100-1143 ?PT Individual Time Calculation (min): 43 min  ? ?Short Term Goals: ?Week 1:  PT Short Term Goal 1 (Week 1): STG=LTG due to ELOS ?PT Short Term Goal 1 - Progress (Week 1): Progressing toward goal ?Week 2:  PT Short Term Goal 1 (Week 2): STG=LTG based on ELOS ? ?Skilled Therapeutic Interventions/Progress Updates:  ?  Session focused on community re-integration and mobility in distracting environment and outside over uneven surfaces. Transported patient to gift shop in hospital via w/c for time management and discussed overall goals, discharge planning, and progress during this time. Pt requires extra time for verbalization due to word finding deficits. In gift shop, pt navigated with RW overall supervision to occasional CGA with cues for safe positioning of RW and to maintain body closer to RW especially during turns. Mild inattention to the R occasionally noted. Pt able to stop and reach for items safely and demonstrate functional dynamic standing balance with supervision. Pt brought outside for gait over uneven surfaces and practiced transfer to bench at table without armrests. Pt performed at overall supervision to occasional CGA level with RW throughout with occasional cues for staying closer to the RW. Pt ambulating > 100' each time. End of session returned to room with all needs in reach and safety belt donned.  ? ?Therapy Documentation ?Precautions:  ?Precautions ?Precautions: Fall ?Precaution Comments: monitor orthostatic BPs ?Restrictions ?Weight Bearing Restrictions: No ? ?Pain: ?No complaints of pain.  ? ? ? ?Therapy/Group: Individual Therapy ? ?Allayne Gitelman ?Lars Masson, PT, DPT, CBIS ? ?10/07/2021, 11:47 AM  ?

## 2021-10-07 NOTE — Progress Notes (Signed)
Patient ID: [name], female   DOB: 28-Apr-1941, 81 y.o.   MRN: [id number] Had face time call with both children-son and daughter to update them regarding team conference progress and Mom's need for 24/7 supervision. Gave them examples of her losing her balance when tried to multi-task making a sandwich in the kitchen with OT and difficulty with the pill box. Daughter will be here tomorrow and have scheduled PT session for 1:00 with her to do car transfer and mobility training. Both want PA/MD to talk with pt regarding no driving and not flying alone. Will see pt once back in room from therapy. ?

## 2021-10-08 NOTE — Progress Notes (Signed)
Inpatient Rehabilitation Admission Medication Review by a Pharmacist ? ?A complete drug regimen review was completed for this patient to identify any potential clinically significant medication issues. ? ?High Risk Drug Classes Is patient taking? Indication by Medication  ?Antipsychotic No   ?Anticoagulant No   ?Antibiotic No   ?Opioid No   ?Antiplatelet Yes Aspirin indefinitely, plavix through 12/20/2021- CVA prophylaxis  ?Hypoglycemics/insulin No   ?Vasoactive Medication Yes Norvasc- hypertension  ?Chemotherapy No   ?Other No   ? ? ? ?Type of Medication Issue Identified Description of Issue Recommendation(s)  ?Drug Interaction(s) (clinically significant) ?    ?Duplicate Therapy ?    ?Allergy ?    ?No Medication Administration End Date ?    ?Incorrect Dose ?    ?Additional Drug Therapy Needed ?    ?Significant med changes from prior encounter (inform family/care partners about these prior to discharge).    ?Other ?    ? ? ?Clinically significant medication issues were identified that warrant physician communication and completion of prescribed/recommended actions by midnight of the next day:  No ? ?Time spent performing this drug regimen review (minutes):  30 ? ? ?Keonta Monceaux BS, PharmD, BCPS ?Clinical Pharmacist ?10/08/2021 11:37 AM ?

## 2021-10-08 NOTE — Progress Notes (Signed)
Patient ID: [name], female   DOB: 18-Nov-1940, 81 y.o.   MRN: [id number]  Met with pt and daughter who is here to go through therapies with pt in preparation for discharge tomorrow. Daughter is aware of the recommendations and feel prepared for her Mom going home tomorrow. Pam-PA aware to discuss no driving and flying in April. Will see in am to make sure no other questions prior to discharge home ?

## 2021-10-08 NOTE — Progress Notes (Signed)
?                                                       PROGRESS NOTE ? ? ?Subjective/Complaints: ? ?Pt reports no issues- slept well- cannot "wait" until d/c tomorrow.  ? ?ROS:  ? ?Pt denies SOB, abd pain, CP, N/V/C/D, and vision changes ? ?Objective: ?  ?No results found. ?Recent Labs  ?  10/06/21 ?0712  ?WBC 4.1  ?HGB 11.1*  ?HCT 33.1*  ?PLT 279  ? ? ?Recent Labs  ?  10/06/21 ?0712  ?NA 135  ?K 3.8  ?CL 104  ?CO2 24  ?GLUCOSE 116*  ?BUN 22  ?CREATININE 1.34*  ?CALCIUM 9.6  ? ? ? ?Intake/Output Summary (Last 24 hours) at 10/08/2021 0811 ?Last data filed at 10/07/2021 2030 ?Gross per 24 hour  ?Intake 580 ml  ?Output --  ?Net 580 ml  ?  ? ?  ? ?Physical Exam: ?Vital Signs ?Blood pressure 138/70, pulse 64, temperature 97.6 ?F (36.4 ?C), resp. rate 16, height 5\' 2"  (1.575 m), weight 77.1 kg, SpO2 94 %. ? ? ? ? ? ?General: awake, alert, appropriate, sitting up in bed;  NAD ?HENT: conjugate gaze; oropharynx moist ?CV: regular rate; no JVD ?Pulmonary: CTA B/L; no W/R/R- good air movement ?GI: soft, NT, ND, (+)BS ?Psychiatric: appropriate- mainly social speech ?Neurological: alert- R inattention more notable ?   Comments: Right forehead hematoma resolving- almost gone. Right lateral ankle with resolving ecchymosis. Large bruise left forearm with skin tear that is healing--helaing   ?Neurological:  ?   Mental Status: She is alert and oriented to person, place, and time.  ?   Comments: follows basic commands. Word finding deficits.  Able to basic answer biographic questions with cues. RLE >RUE weakness with sensory deficits.   ?Pt is Apraxic RIght side , strength 4/5 R and 5/5 Left ?Assessment/Plan: ?1. Functional deficits which require 3+ hours per day of interdisciplinary therapy in a comprehensive inpatient rehab setting. ?Physiatrist is providing close team supervision and 24 hour management of active medical problems listed below. ?Physiatrist and rehab team continue to assess barriers to discharge/monitor patient  progress toward functional and medical goals ? ?Care Tool: ? ?Bathing ?   ?Body parts bathed by patient: Right arm, Left arm, Chest, Abdomen, Front perineal area, Buttocks, Right upper leg, Left upper leg, Right lower leg, Left lower leg, Face  ?   ?  ?  ?Bathing assist Assist Level: Contact Guard/Touching assist ?  ?  ?Upper Body Dressing/Undressing ?Upper body dressing   ?What is the patient wearing?: Bra, Pull over shirt ?   ?Upper body assist Assist Level: Supervision/Verbal cueing ?   ?Lower Body Dressing/Undressing ?Lower body dressing ? ? ?   ?What is the patient wearing?: Underwear/pull up, Pants ? ?  ? ?Lower body assist Assist for lower body dressing: Supervision/Verbal cueing ?   ? ?Toileting ?Toileting    ?Toileting assist Assist for toileting: Supervision/Verbal cueing ?  ?  ?Transfers ?Chair/bed transfer ? ?Transfers assist ?   ? ?Chair/bed transfer assist level: Supervision/Verbal cueing ?Chair/bed transfer assistive device: Walker ?  ?Locomotion ?Ambulation ? ? ?Ambulation assist ? ?   ? ?Assist level: Contact Guard/Touching assist ?Assistive device: Walker-rolling ?Max distance: 150  ? ?Walk 10 feet activity ? ? ?Assist ?   ? ?Assist level:  Supervision/Verbal cueing ?Assistive device: Walker-rolling  ? ?Walk 50 feet activity ? ? ?Assist Walk 50 feet with 2 turns activity did not occur: Safety/medical concerns ? ?Assist level: Contact Guard/Touching assist ?Assistive device: Walker-rolling  ? ? ?Walk 150 feet activity ? ? ?Assist Walk 150 feet activity did not occur: Safety/medical concerns ? ?Assist level: Contact Guard/Touching assist ?Assistive device: Walker-rolling ?  ? ?Walk 10 feet on uneven surface  ?activity ? ? ?Assist Walk 10 feet on uneven surfaces activity did not occur: Safety/medical concerns ? ? ?Assist level: Contact Guard/Touching assist ?Assistive device: Walker-rolling  ? ?Wheelchair ? ? ? ? ?Assist Is the patient using a wheelchair?: Yes ?  ?  ? ?Wheelchair assist level: Moderate  Assistance - Patient 50 - 74% ?Max wheelchair distance: 150  ? ? ?Wheelchair 50 feet with 2 turns activity ? ? ? ?Assist ? ?  ?  ? ? ?Assist Level: Moderate Assistance - Patient 50 - 74%  ? ?Wheelchair 150 feet activity  ? ? ? ?Assist ?   ? ? ?Assist Level: Moderate Assistance - Patient 50 - 74%  ? ?Blood pressure 138/70, pulse 64, temperature 97.6 ?F (36.4 ?C), resp. rate 16, height 5\' 2"  (1.575 m), weight 77.1 kg, SpO2 94 %. ? ?Medical Problem List and Plan: ?1. 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            -patient may shower ?            -ELOS/Goals:S 10-14 days ?            d/c 4/6 ? Continue CIR- PT, OT and SLP ? Will d/w pt cannot drive or fly alone.  ?2.  Antithrombotics: ?-DVT/anticoagulation:  Pharmaceutical: Lovenox ?            -antiplatelet therapy: DAPT X 3 months followed by ASA alone.  ?3. Pain along dorsum of right foot: Tylenol prn. Add voltaren gel prn ? 3/29- denies pain- con't regimen prn ? 4/3- still having aching pai in R foot- con't tylenol prn ?4. Mood: LCSW to follow for evaluation and support.  ?            -antipsychotic agents: N/A ?5. Neuropsych: This patient is not fully capable of making decisions on her own behalf. ?6. Bruising right arm: wrapped to prevent skin tears ?7. Fluids/Electrolytes/Nutrition: Monitor I/O.  ?8. HTN: Permissive hypertension first 7 days and then start normalizing.  ?--Encourage fluid intake.  ?- continue amlodipine 5mg  daily ?3/31- BP better with increase in norvasc already- con't regimen ?4/5- BP cotnrolled- con't regimen ?9. CKD: Baseline SCr- 1.33? Avoid nephrotoxic medications. Current creatinine is 1.16. Place nursing order to encourage 6-8 glasses of water per day. ?3/28- Cr 1.40- will recheck Thursday-  ?3/30- Cr up to 1.74 and BUN up to 32- from 25- will give NS IVFs 75cc/hour x 24 hours and push fluids and recheck in AM- she needs ot drink more- no renal impairing meds ? 3/31- drinking 2 cups/day- encouraged 6-8 cups/day- waiting for  labs results for change/new Cr/BUN ?4/3- Cr down to 1.34 and BUN down to 22- less dry- con't PO intake ?4/5- educated pt even when goes home, needs ot drink 6-8 cups/water daily to help kidneys function better ?10. Anxiety with depression: continue prozac.  ?11. Asthma/H/o Lung Ca-stage 1: Stable on Dulera. Continue regimen.  ?12. GERD: Used PPI intermittently at home.  ?--Daily Pepcid effective at this time.   ?13. Hyperactive bladder: Will resume Enablex for Vesicare (family  to bring in home med) ? 3/25 voided 4 x in last 24 hours ? 3/29- voiding- emptying.  ?14. Glaucoma: On Cosopt.  ?15. Vitamin D deficiency: start ergocalciferol 50,000U once per week for 7 weeks.  ?16. Orthostasis: no longer with orthostasis- discontinue 5am orthostatic vital signs.  ?17. Suboptimal magnesium level: 250mg  magnesium gluconate started HS.  ?18. Insomnia ? 3/27- started trazodone 50 mg QHS per her home dose.  ? 4/4- sleeping well ? ? ? ? ? ? ? ?LOS: ?14 days ?A FACE TO FACE EVALUATION WAS PERFORMED ? ?Jared Cahn ?10/08/2021, 8:11 AM  ? ?  ?

## 2021-10-08 NOTE — Plan of Care (Signed)
?  Problem: RH Balance ?Goal: LTG Patient will maintain dynamic sitting balance (PT) ?Description: LTG:  Patient will maintain dynamic sitting balance with assistance during mobility activities (PT) ?Outcome: Completed/Met ?Goal: LTG Patient will maintain dynamic standing balance (PT) ?Description: LTG:  Patient will maintain dynamic standing balance with assistance during mobility activities (PT) ?Outcome: Completed/Met ?  ?Problem: RH Bed Mobility ?Goal: LTG Patient will perform bed mobility with assist (PT) ?Description: LTG: Patient will perform bed mobility with assistance, with/without cues (PT). ?Outcome: Completed/Met ?  ?Problem: RH Bed to Chair Transfers ?Goal: LTG Patient will perform bed/chair transfers w/assist (PT) ?Description: LTG: Patient will perform bed to chair transfers with assistance (PT). ?Outcome: Completed/Met ?  ?Problem: RH Car Transfers ?Goal: LTG Patient will perform car transfers with assist (PT) ?Description: LTG: Patient will perform car transfers with assistance (PT). ?Outcome: Completed/Met ?  ?Problem: RH Ambulation ?Goal: LTG Patient will ambulate in controlled environment (PT) ?Description: LTG: Patient will ambulate in a controlled environment, # of feet with assistance (PT). ?Outcome: Completed/Met ?Goal: LTG Patient will ambulate in home environment (PT) ?Description: LTG: Patient will ambulate in home environment, # of feet with assistance (PT). ?Outcome: Completed/Met ?  ?

## 2021-10-08 NOTE — Progress Notes (Signed)
Speech Language Pathology Discharge Summary ? ?Patient Details  ?Name: [name] ?MRN: [id number] ?Date of Birth: [birth year] ? ?Today's Date: 10/08/2021 ?SLP Individual Time: 0901-1000 ?SLP Individual Time Calculation (min): 59 min ? ? ?Skilled Therapeutic Interventions: Skilled ST services focused on education and language skills. SLP facilitated assessment of expressive and receptive language skills with subsections of the Test of Adult/Adolescent Word Finding, due time restraints. Pt demonstrated 6/14 accuracy on picturing naming of nouns, 5/6 accuracy on sentence completion naming and 2/3 accuracy in description naming. Pt was able to increase word finding to 90% accuracy with sentence completion and phonemic cues. Pt was aware of 90% of verbal errors during today's tasks. SLP completed with education with pt's daughter via phone, whom is a Psychologist, clinical. SLP provided general education pertaining to current expressive and receptive function, effective cuing methods and strategies to practice at home. All questions answered to satisfaction. Pt was left in room with call bell within reach and chair alarm set. SLP recommends to continue skilled services. ? ? ? ?Patient has met 4 of 4 long term goals.  Patient to discharge at overall Supervision level.  ?Reasons goals not met:    ? ?Clinical Impression/Discharge Summary:   Pt demonstrated great progress meeting 4 out 4 goals, discharging at supervision A at a basic level. Pt is able to express wants/needs with supervision A verbal cues, however at the sentence and conversation level requires min-mod A sentence completion, semantic and phonetic cues. Pt demonstrated awareness of verbal errors with supervision A verbal cues. Pt is able to follow basic commands, requiring increasing cuing for 3-4 step commands. Pt's barriers at discharge are higher level word finding, higher level receptive language, reading at the sentence level and witting at the  word level. Further cognitive assessment is needed as language improves. Education was completed with py's daughter via phone. Pt benefited from skilled ST services in order to maximize functional independence and reduce burden of care, likely requiring supervision at discharge with continued skilled ST services. ? ? ?Care Partner:  ?Caregiver Able to Provide Assistance: Yes  ?Type of Caregiver Assistance: Physical;Cognitive ? ?Recommendation:  ?24 hour supervision/assistance;Outpatient SLP  ?Rationale for SLP Follow Up: Maximize functional communication;Maximize cognitive function and independence;Reduce caregiver burden  ? ?Equipment: N/A  ? ?Reasons for discharge: Discharged from hospital  ? ?Patient/Family Agrees with Progress Made and Goals Achieved: Yes  ? ? ?Logen Fowle ?10/08/2021, 2:20 PM ? ?

## 2021-10-08 NOTE — Progress Notes (Signed)
Physical Therapy Discharge Summary ? ?Patient Details  ?Name: [name] ?MRN: [id number] ?Date of Birth: 1940/12/24 ? ?Today's Date: 10/08/2021 ?PT Individual Time: 4496-7591 ?PT Individual Time Calculation (min): 75 min  ? ?Daily session: Patient received sitting up in recliner, agreeable to PT. She denies pain. Dtr present for family education. Discussed patients need for close supervision (within arms length) for all mobility with RW due to R side inattention and fluctuating balance. Patient ambulating to therapy gym with RW and supervision. Able to negotiate 4 steps with B HR and supervision. Patient transferring in/out of car with supervision and verbal cues for safety. Patient able to ambulate over uneven ground with RW and supervision. Patient completed the TUG in 47s with RW and supervision, indicating an increased risk for falling and benefit from use of AD. Discussed with patient and dtr and they verbalized understanding. Patient able to transfer into/out of a high bed like she has at home with supervision and verbal cues. Discussed with patient and dtr if patient were to fall with injury- to call 911 or if unable to get up. Also benefit form use of gait belt and how to safely guard patient. Patient ambulating back to her room with RW and supervision. Bed alarm on, call light within reach. Dtr and patient without further questions at this time.  ? ?Patient has met 7 of 7 long term goals due to improved activity tolerance, improved balance, improved postural control, increased strength, decreased pain, ability to compensate for deficits, functional use of  right upper extremity and right lower extremity, improved attention, improved awareness, and improved coordination.  Patient to discharge at an ambulatory level Supervision.   Patient's care partner is independent and unavailable dtr is only available temporarily before moving back to [place] to provide the necessary physical and cognitive assistance at  discharge. ? ? ? ?Recommendation:  ?Patient will benefit from ongoing skilled PT services in home health setting to continue to advance safe functional mobility, address ongoing impairments in awareness, balance, gait progressions, and minimize fall risk. ? ?Equipment: ?No equipment provided ? ?Reasons for discharge: treatment goals met and discharge from hospital ? ?Patient/family agrees with progress made and goals achieved: Yes ? ?PT Discharge ?Precautions/Restrictions ?Precautions ?Precautions: Fall ?Restrictions ?Weight Bearing Restrictions: No ?Pain ?Pain Assessment ?Pain Scale: 0-10 ?Pain Score: 0-No pain ?Pain Interference ?Pain Interference ?Pain Effect on Sleep: 0. Does not apply - I have not had any pain or hurting in the past 5 days ?Pain Interference with Therapy Activities: 0. Does not apply - I have not received rehabilitationtherapy in the past 5 days ?Pain Interference with Day-to-Day Activities: 1. Rarely or not at all ?Vision/Perception  ?Vision - History ?Ability to See in Adequate Light: 0 Adequate ?Perception ?Perception: Impaired ?Inattention/Neglect: Does not attend to right visual field ?Praxis ?Praxis: Impaired  ?Cognition ?Overall Cognitive Status: Impaired/Different from baseline ?Arousal/Alertness: Awake/alert ?Orientation Level: Oriented to person;Oriented to place;Oriented to situation;Oriented X4 ?Attention: Selective ?Selective Attention: Appears intact ?Memory: Impaired (word finding deficits) ?Memory Impairment: Storage deficit;Retrieval deficit;Decreased recall of new information ?Awareness: Appears intact ?Awareness Impairment: Anticipatory impairment ?Problem Solving: Impaired ?Problem Solving Impairment: Verbal basic;Functional basic ?Safety/Judgment: Appears intact ?Sensation ?Sensation ?Light Touch: Impaired Detail ?Light Touch Impaired Details: Impaired RLE;Impaired RUE ?Hot/Cold: Appears Intact ?Proprioception: Impaired Detail ?Proprioception Impaired Details: Impaired  RLE ?Stereognosis: Impaired by gross assessment ?Coordination ?Gross Motor Movements are Fluid and Coordinated: No ?Fine Motor Movements are Fluid and Coordinated: No ?Coordination and Movement Description: 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Transfers ?Transfers: Sit to Stand;Stand to [employer] Transfers ?Sit to Stand: Supervision/Verbal cueing ?Stand to Sit: Supervision/Verbal cueing ?Stand Pivot Transfers: Supervision/Verbal cueing ?Transfer (Assistive device): Rolling walker ?Locomotion  ?Gait ?Ambulation: Yes ?Gait Assistance: Supervision/Verbal cueing ?Gait Distance (Feet): 300 Feet ?Assistive device: Rolling walker ?Gait ?Gait: Yes ?Gait Pattern: Impaired ?Gait Pattern: Trunk flexed;Poor foot clearance - left;Poor foot clearance - right;Narrow base of support ?Gait velocity: decr ?Stairs / Additional Locomotion ?Stairs: Yes ?Stairs Assistance: Supervision/Verbal cueing ?Stair Management Technique: Two rails ?Number of Stairs: 4 ?Height of Stairs: 6 ?Ramp: Supervision/Verbal cueing ?Curb: Supervision/Verbal cueing ?Wheelchair Mobility ?Wheelchair Mobility: No  ?Trunk/Postural Assessment  ?Cervical Assessment ?Cervical Assessment: Within Functional Limits ?Thoracic Assessment ?Thoracic Assessment: Within Functional Limits ?Lumbar Assessment ?Lumbar Assessment: Within Functional Limits ?Postural Control ?Postural Control: Deficits on evaluation ?Trunk  Control: R lean in standing with dynamic balance; increased lean with dual tasking  ?Balance ?Balance ?Balance Assessed: Yes ?Standardized Balance Assessment ?Standardized Balance Assessment: Timed Up and Go Test ?Timed Up and Go Test ?TUG: Normal TUG ?Normal TUG (seconds): 47 ?Static Sitting Balance ?Static Sitting - Level of Assistance: 7: Independent ?Dynamic Sitting Balance ?Dynamic Sitting - Level of Assistance: 5: Stand by assistance ?Static Standing Balance ?Static Standing - Balance Support: Bilateral upper extremity supported ?Static Standing - Level of Assistance: 5: Stand by assistance ?Dynamic Standing Balance ?Dynamic Standing - Balance Support: During functional activity ?Dynamic Standing - Level of Assistance: 5: Stand by assistance ?Extremity Assessment  ?  ?  ?RLE Assessment ?RLE Assessment: Exceptions to [facility] ?General Strength Comments: grossly 4+/5 ?LLE Assessment ?LLE Assessment: Within Functional Limits ?General Strength Comments: grossly 4+/5 to 5/5 ? ? ? ?Debbora Dus ?Debbora Dus, PT, DPT, CBIS ? ?10/08/2021, 2:19 PM ?

## 2021-10-08 NOTE — Progress Notes (Signed)
Inpatient Rehabilitation Care Coordinator ?Discharge Note  ? ?Patient Details  ?Name: [name] ?MRN: [id number] ?Date of Birth: 10-Jul-1940 ? ? ?Discharge location: [place] ? ?Length of Stay: 15 days ? ?Discharge activity level: CLOSE SUPERVISION ? ?Home/community participation: ACTIVE ? ?Patient response AT:FTDDUK Literacy - How often do you need to have someone help you when you read instructions, pamphlets, or other written material from your doctor or pharmacy?: Never ? ?Patient response GU:RKYHCW Isolation - How often do you feel lonely or isolated from those around you?: Never ? ?Services provided included: MD, RD, PT, OT, SLP, RN, CM, TR, Pharmacy, SW ? ?Financial Services:  ?Charity fundraiser Utilized: Private Insurance ?[employer]HC-MEDICARE ? ?Choices offered to/list presented to: PT AND DAUGHTER ? ?Follow-up services arranged:  ?Home Health, Patient/Family has no preference for HH/DME agencies ?Home Health Agency: [facility] OT SP  ?  ?  ?  ? ?Patient response to transportation need: ?Is the patient able to respond to transportation needs?: Yes ?In the past 12 months, has lack of transportation kept you from medical appointments or from getting medications?: No ?In the past 12 months, has lack of transportation kept you from meetings, work, or from getting things needed for daily living?: No ? ? ? ?Comments (or additional information):SONJA AND PT';S SON WERE HERE TO SEE MOM AND AWARE OF HER NEED FOR 24/7 SUPERVISION DUE TO BALANCE ISSUES AND SPEECH ISSUES. DAUGHTER TO HIRE PRIVATE DUTY ASSIST. ALSO SPOKE WITH REGARDING ASSISTED LIVING FACILITY.  ? ?Patient/Family verbalized understanding of follow-up arrangements:  Yes ? ?Individual responsible for coordination of the follow-up plan: [facility] [phone] ? ?Confirmed correct DME delivered: Elease Hashimoto 10/08/2021   ? ?Elease Hashimoto ?

## 2021-10-08 NOTE — Progress Notes (Signed)
Occupational Therapy Session Note ? ?Patient Details  ?Name: [name] ?MRN: [id number] ?Date of Birth: [birth year] ? ?Today's Date: 10/08/2021 ?OT Individual Time: 1105-1200 ?OT Individual Time Calculation (min): 55 min  ? ? ?Short Term Goals: ?Week 2:  OT Short Term Goal 1 (Week 2): Pt's LOS extended. STGs = LTGs ? ?Skilled Therapeutic Interventions/Progress Updates:  ?  Pt received supine with no c/o pain, agreeable to OT session. She completed bed mobility with mod I to EOB. (S) ambulatory transfer to the bathroom, transferring to the toilet first for continent urine void. She required min cueing for sequencing steps to get into shower and doff clothing appropriately. She transferred into shower and completed majority of bathing seated with supervision overall. Good attention to R side. Standing level peri hygiene with min cueing for thoroughness but close supervision. Pt does a good job of holding onto grab bar entire time. She transferred back to EOB to dress. UB dressing with min A only to fasten bra. Shirt with (S), as well (S) for LB dressing. She completed 150 ft of functional mobility out of the room and to the dayroom. She had a large LOB with mod A to recover. Pt completed an Patent attorney in standing with progressive R lean requiring cueing for midline awareness. She required min cueing/assist for sequencing through simple craft. She returned to her room and was left supine with all needs met, bed alarm set. LPN alerted to bed malfunctioning.  ? ? ?Therapy Documentation ?Precautions:  ?Precautions ?Precautions: Fall ?Precaution Comments: monitor orthostatic BPs ?Restrictions ?Weight Bearing Restrictions: No ? ?Therapy/Group: Individual Therapy ? ?Curtis Sites ?10/08/2021, 6:35 AM ?

## 2021-10-08 NOTE — Discharge Instructions (Addendum)
Inpatient Rehab Discharge Instructions ? ?[name] ?Discharge date and time:   ? ?Activities/Precautions/ Functional Status: ?Activity: no lifting, driving, or strenuous exercise till cleared by MD ?Diet: cardiac diet ?Wound Care: none needed ?Functional status:  ?___ No restrictions     ___ Walk up steps independently ?___ 24/7 supervision/assistance   ___ Walk up steps with assistance ?___ Intermittent supervision/assistance  ___ Bathe/dress independently ?___ Walk with walker     ___ Bathe/dress with assistance ?___ Walk Independently    ___ Shower independently ?___ Walk with assistance    ___ Shower with assistance ?___ No alcohol     ___ Return to work/school ________ ? ?Special Instructions: ? ? ? ? ?COMMUNITY REFERRALS UPON DISCHARGE:   ? ?Home Health:   PT  OT   SP ?               Agency:[facility] Phone: [phone]  ? ?Medical Equipment/Items Ordered:HAS ROLLING WALKER ?                                                Agency/Supplier:NA ? ?PRIVATE DUTY LIST GIVEN TO DAUGHTER TO PURSUE CAREGIVER ? ?STROKE/TIA DISCHARGE INSTRUCTIONS ?SMOKING Cigarette smoking nearly doubles your risk of having a stroke & is the single most alterable risk factor  ?If you smoke or have smoked in the last 12 months, you are advised to quit smoking for your health. Most of the excess cardiovascular risk related to smoking disappears within a year of stopping. ?Ask you doctor about anti-smoking medications ?[place] Quit Line: 1-800-QUIT NOW ?Free Smoking Cessation Classes [phone]  ?CHOLESTEROL Know your levels; limit fat & cholesterol in your diet  ?Lipid Panel  ?   ?Component Value Date/Time  ? CHOL 151 09/22/2021 0434  ? TRIG 143 09/22/2021 0434  ? HDL 53 09/22/2021 0434  ? CHOLHDL 2.8 09/22/2021 0434  ? VLDL 29 09/22/2021 0434  ? [place] 69 09/22/2021 0434  ? ? ? Many patients benefit from treatment even if their cholesterol is at goal. ?Goal: Total Cholesterol (CHOL) less than 160 ?Goal:   Triglycerides (TRIG) less than 150 ?Goal:  HDL greater than 40 ?Goal:  LDL (LDLCALC) less than 100 ?  ?BLOOD PRESSURE American Stroke Association blood pressure target is less that 120/80 mm/Hg  ?Your discharge blood pressure is:  BP: 138/70 Monitor your blood pressure ?Limit your salt and alcohol intake ?Many individuals will require more than one medication for high blood pressure  ?DIABETES (A1c is a blood sugar average for last 3 months) Goal HGBA1c is under 7% (HBGA1c is blood sugar average for last 3 months)  ?Diabetes: ?No known diagnosis of diabetes   ? ?Lab Results  ?Component Value Date  ? HGBA1C 5.2 09/22/2021  ? ? Your HGBA1c can be lowered with medications, healthy diet, and exercise. ?Check your blood sugar as directed by your physician ?Call your physician if you experience unexplained or low blood sugars.  ?PHYSICAL ACTIVITY/REHABILITATION Goal is 30 minutes at least 4 days per week  ?Activity: No driving, ?Therapies: see above ?Return to work: N/A Activity decreases your risk of heart attack and stroke and makes your heart stronger.  It helps control your weight and blood pressure; helps you relax and can improve your mood. ?Participate in a regular exercise program. ?Talk with your doctor about the best form of exercise  for you (dancing, walking, swimming, cycling).  ?DIET/WEIGHT Goal is to maintain a healthy weight  ?Your discharge diet is:  ?Diet Order   ? ?       ?  Diet Heart Room service appropriate? Yes; Fluid consistency: Thin  Diet effective now       ?  ? ?  ?  ? ?  ?  liquids ?Your height is:  Height: 5\' 2"  (157.5 cm) ?Your current weight is: Weight: 170 lbs  ?Your Body Mass Index (BMI) is:  BMI (Calculated): 31.08 Following the type of diet specifically designed for you will help prevent another stroke. ?Your goal weight is:  136 lbs ?Your goal Body Mass Index (BMI) is 19-24. ?Healthy food habits can help reduce 3 risk factors for stroke:  High cholesterol, hypertension, and excess  weight.  ?RESOURCES Stroke/Support Group:  Call [phone] ?  ?STROKE EDUCATION PROVIDED/REVIEWED AND GIVEN TO PATIENT Stroke warning signs and symptoms ?How to activate emergency medical system (call 911). ?Medications prescribed at discharge. ?Need for follow-up after discharge. ?Personal risk factors for stroke. ?Pneumonia vaccine given:  ?Flu vaccine given:  ?My questions have been answered, the writing is legible, and I understand these instructions.  I will adhere to these goals & educational materials that have been provided to me after my discharge from the hospital.  ? ?  ? ?My questions have been answered and I understand these instructions. I will adhere to these goals and the provided educational materials after my discharge from the hospital. ? ?Patient/Caregiver Signature _______________________________ Date __________ ? ?Clinician Signature _______________________________________ Date __________ ? ?Please bring this form and your medication list with you to all your follow-up doctor's appointments.   ?

## 2021-10-09 ENCOUNTER — Ambulatory Visit: Payer: Medicare Other | Admitting: Physician Assistant

## 2021-10-09 MED ORDER — MAGNESIUM GLUCONATE 500 MG PO TABS: 250.0000 mg | ORAL_TABLET | Freq: Every day | ORAL | 0 refills | Status: DC

## 2021-10-09 MED ORDER — DICLOFENAC SODIUM 1 % EX GEL
2.0000 g | Freq: Four times a day (QID) | CUTANEOUS | 0 refills | Status: DC | PRN
Start: 2021-10-09 — End: 2022-01-28

## 2021-10-09 MED ORDER — CLOPIDOGREL BISULFATE 75 MG PO TABS: 75.0000 mg | ORAL_TABLET | Freq: Every day | ORAL | 0 refills | Status: DC

## 2021-10-09 MED ORDER — ASPIRIN EC 81 MG PO TBEC: 81.0000 mg | DELAYED_RELEASE_TABLET | Freq: Every day | ORAL | 3 refills | Status: AC

## 2021-10-09 MED ORDER — AMLODIPINE BESYLATE 10 MG PO TABS: 10.0000 mg | ORAL_TABLET | Freq: Every morning | ORAL | 0 refills | Status: AC

## 2021-10-09 MED ORDER — ACETAMINOPHEN 325 MG PO TABS: 650.0000 mg | ORAL_TABLET | ORAL | Status: DC | PRN

## 2021-10-09 MED ORDER — VITAMIN C 100 MG PO TABS: 100.0000 mg | ORAL_TABLET | Freq: Every day | ORAL | 0 refills | Status: AC

## 2021-10-09 MED ORDER — VITAMIN D (ERGOCALCIFEROL) 1.25 MG (50000 UNIT) PO CAPS: 50000.0000 [IU] | ORAL_CAPSULE | ORAL | 0 refills | Status: DC

## 2021-10-09 NOTE — Discharge Summary (Signed)
di ?Physician Discharge Summary  ?Patient ID: ?[name] ?MRN: [id number] ?DOB/AGE: 1941/01/27 81 y.o. ? ?Admit date: 09/24/2021 ?Discharge date: 10/09/2021 ? ?Discharge Diagnoses:  ?Principal Problem: ?  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  Aphasia due to acute cerebrovascular accident (CVA) ([place]) ?  Hypertension ?  Asthma, chronic ?  Insomnia ?  Frequency of urination ?  Vitamin D deficiency ?  Chronic renal failure ? ? ?Discharged Condition: stable ? ?Significant Diagnostic Studies: N/a ? ? ?Labs:  ?Basic Metabolic Panel: ? ?  Latest Ref Rng & Units 10/06/2021  ?  7:12 AM 10/03/2021  ?  7:54 AM 10/02/2021  ?  7:01 AM  ?BMP  ?Glucose 70 - 99 mg/dL 116   92   96    ?BUN 8 - 23 mg/dL 22   30   32    ?Creatinine 0.44 - 1.00 mg/dL 1.34   1.52   1.74    ?Sodium 135 - 145 mmol/L 135   135   137    ?Potassium 3.5 - 5.1 mmol/L 3.8   4.8   4.4    ?Chloride 98 - 111 mmol/L 104   105   106    ?CO2 22 - 32 mmol/L 24   24   24     ?Calcium 8.9 - 10.3 mg/dL 9.6   9.5   9.0    ?  ? ? ?CBC: ? ?  Latest Ref Rng & Units 10/06/2021  ?  7:12 AM 09/29/2021  ?  7:07 AM 09/25/2021  ?  6:03 AM  ?CBC  ?WBC 4.0 - 10.5 K/uL 4.1   4.6   5.8    ?Hemoglobin 12.0 - 15.0 g/dL 11.1   11.2   11.7    ?Hematocrit 36.0 - 46.0 % 33.1   34.3   34.3    ?Platelets 150 - 400 K/uL 279   300   281    ?  ? ? ?CBG: ?No results for input(s): GLUCAP in the last 168 hours. ? ?Brief HPI:   [name] is a 81 y.o. female with history of lung cancer stage I, asthma, CKD, HTN who was admitted on 09/21/21 with difficulty speaking. CT head was negative but she developed signs of right neglect and CTA head/neck done revealing distal L-MCA branch occlusion and 70% stenosis of proximal R-ICA due to calcified atherosclerosis. She was out of window of TNKase and went on to develop right sided weakness. MRI brain done revealing L-MCA infarct and Dr. Erlinda Hong recommended DAPT X 3 months followed by ASA alone as well as CardioNet  monitor after discharge. She was treated with IVF for hypotension as well as AKI with improvement. She continued to be limited by expressive > receptive aphasia, right sided weakness, motor planning deficits as well as mild orthostatic deficits. CIR was recommended due to functional decline.    ? ? ?Hospital Course: [name] was admitted to rehab 09/24/2021 for inpatient therapies to consist of PT, ST and OT at least three hours five days a week. Past admission physiatrist, therapy team and rehab RN have worked together to provide customized collaborative inpatient rehab. Blood pressures were monitored on TID basis and orthostatic changes have resolved. As BP started trending upwards, amlodipine was increased to 10 mg a day. Serial check of lytes ordered to monitor renal status and she was found to have acute on chronic renal failure requiring supplementation with IV fluids x24 hours.  She has  been encouraged to increase fluid intake and recommend follow up labs in 1-2 weeks to monitor hydration status. Most recent BMET showed serum creatinine improving and close to baseline.   ? ?She has had issues with insomnia and trazodone was added with good results.  She was found to have vitamin D deficiency  (Vit D 25- hydroxyl - 17.41) and was started on ergocalciferol for supplementation.  She was maintained on DAPT throughout her stay and follow-up CBC shows H&H and platelets to be stable.  She has had reports of foot pain with was managed with Voltaren gel.  She reported issues with issues with chronic frequency and was noted to be voiding without signs of retention.  Enablex used during her stay and she is to resume home Vesicare after discharge.  Her mood has been stable and she has made steady gains during her rehab stay.  She continues to have right intention with balance and high-level cognitive deficits. Supervision is recommended for safety.  She will continue to receive follow-up HHPT, [place] and [place] by  [place] after discharge.  ? ? ?Rehab course: During patient's stay in rehab weekly team conferences were held to monitor patient's progress, set goals and discuss barriers to discharge. At admission, patient required min to mod assist for mobility and min assist with ADL tasks.  She exhibited mild to moderate expressive aphasia and mild receptive aphasia with 80% accuracy with object naming and had difficulty following sequential and complex commands.  She  has had improvement in activity tolerance, balance, postural control as well as ability to compensate for deficits.  She has had improvement in functional use of RUE and RLE with improvement in coordination.  She requires contact-guard to min assist to complete ADL tasks.  She requires supervision for transfers and to ambulate 300 feet with rolling walker.  She is able to express wants and needs with supervisory verbal cues for awareness of verbal errors and requires min to mod assist for sentence and at conversation completion.    ? ?Discharge disposition: 01-Home or Self Care ? ?Diet:  Heart Healthy.  ? ?Special Instructions: ?No driving or strenuous activity till cleared by MD. ?Recommend repeat BMET in 1-2 weeks to monitor SCr.  ?3.  Recheck vitamin D levels in 6 weeks.  ? ?Discharge Instructions   ? ? Ambulatory referral to Cardiology   Complete by: As directed ?  ? Cardiac monitor/stroke work up  ? Ambulatory referral to Neurology   Complete by: As directed ?  ? An appointment is requested in approximately: 3-4 weeks  ? Ambulatory referral to Physical Medicine Rehab   Complete by: As directed ?  ? ?  ? ?Allergies as of 10/09/2021   ? ?   Reactions  ? Grass Extracts [gramineae Pollens] Other (See Comments)  ? Unknown  ? Nsaids Other (See Comments)  ? Elevated kidney fx  ? Prednisone Nausea And Vomiting, Other (See Comments)  ? Can take Prednisone injection but not orally  ? ?  ? ?  ?Medication List  ?  ? ?STOP taking these medications    ? ?Besivance 0.6 % Susp ?Generic drug: Besifloxacin HCl ?  ?hydrALAZINE 25 MG tablet ?Commonly known as: APRESOLINE ?  ?losartan 100 MG tablet ?Commonly known as: COZAAR ?  ?losartan 50 MG tablet ?Commonly known as: COZAAR ?  ?methocarbamol 500 MG tablet ?Commonly known as: Robaxin ?  ?metoprolol tartrate 50 MG tablet ?Commonly known as: LOPRESSOR ?  ?omeprazole 40 MG capsule ?  Commonly known as: PRILOSEC ?  ? ?  ? ?TAKE these medications   ? ?acetaminophen 325 MG tablet ?Commonly known as: TYLENOL ?Take 2 tablets (650 mg total) by mouth every 4 (four) hours as needed for mild pain. ?What changed: reasons to take this ?  ?albuterol 108 (90 Base) MCG/ACT inhaler ?Commonly known as: VENTOLIN HFA ?TAKE 2 PUFFS BY MOUTH EVERY 6 HOURS AS NEEDED FOR WHEEZE OR SHORTNESS OF BREATH ?  ?amLODipine 10 MG tablet ?Commonly known as: NORVASC ?Take 1 tablet (10 mg total) by mouth every morning. ?What changed: Another medication with the same name was removed. Continue taking this medication, and follow the directions you see here. ?  ?aspirin EC 81 MG tablet ?Take 1 tablet (81 mg total) by mouth daily. Swallow whole. ?  ?budesonide-formoterol 80-4.5 MCG/ACT inhaler ?Commonly known as: Symbicort ?INHALE 2 PUFFS BY MOUTH INTO THE LUNGS DAILY ?What changed: Another medication with the same name was removed. Continue taking this medication, and follow the directions you see here. ?  ?CALCIUM 1200+D3 PO ?Take 1 tablet by mouth daily. ?  ?CENTRUM SILVER PO ?Take 1 tablet by mouth daily. ?  ?chlorpheniramine 4 MG tablet ?Commonly known as: CHLOR-TRIMETON ?Take 4 mg by mouth at bedtime. ?  ?clopidogrel 75 MG tablet ?Commonly known as: PLAVIX ?Take 1 tablet (75 mg total) by mouth daily. ?What changed: additional instructions ?  ?diclofenac Sodium 1 % Gel ?Commonly known as: VOLTAREN ?Apply 2 g topically 4 (four) times daily as needed (Foot pain). ?  ?dorzolamide-timolol 22.3-6.8 MG/ML ophthalmic solution ?Commonly known as: COSOPT ?Place 1  drop into both eyes 2 (two) times daily. ?What changed: Another medication with the same name was removed. Continue taking this medication, and follow the directions you see here. ?  ?famotidine 20 MG tablet ?Com

## 2021-10-09 NOTE — Progress Notes (Signed)
?                                                       PROGRESS NOTE ? ? ?Subjective/Complaints: ? ?Educated pt that she cannot fly alone or drive- she voiced understanding.  ?Ready for d/c today- excited, but nervous.  ? ?ROS:  ? ? ?Pt denies SOB, abd pain, CP, N/V/C/D, and vision changes ? ? ?Objective: ?  ?No results found. ?No results for input(s): WBC, HGB, HCT, PLT in the last 72 hours. ? ? ?No results for input(s): NA, K, CL, CO2, GLUCOSE, BUN, CREATININE, CALCIUM in the last 72 hours. ? ? ? ?Intake/Output Summary (Last 24 hours) at 10/09/2021 0909 ?Last data filed at 10/09/2021 0847 ?Gross per 24 hour  ?Intake 780 ml  ?Output --  ?Net 780 ml  ?  ? ?  ? ?Physical Exam: ?Vital Signs ?Blood pressure 123/65, pulse (!) 57, temperature 97.8 ?F (36.6 ?C), resp. rate 15, height 5\' 2"  (1.575 m), weight 78 kg, SpO2 100 %. ? ? ? ? ? ? ?General: awake, alert, appropriate, NAD ?HENT: conjugate gaze; oropharynx moist ?CV: regular rate; no JVD ?Pulmonary: CTA B/L; no W/R/R- good air movement ?GI: soft, NT, ND, (+)BS ?Psychiatric: appropriate ?Neurological: R inattention noted; aphasia more evident this AM ?   Comments: Right forehead hematoma resolving- almost gone. Right lateral ankle with resolving ecchymosis. Large bruise left forearm with skin tear that is healing--helaing   ?Neurological:  ?   Mental Status: She is alert and oriented to person, place, and time.  ?   Comments: follows basic commands. Word finding deficits.  Able to basic answer biographic questions with cues. RLE >RUE weakness with sensory deficits.   ?Pt is Apraxic RIght side , strength 4/5 R and 5/5 Left ?Assessment/Plan: ?1. Functional deficits which require 3+ hours per day of interdisciplinary therapy in a comprehensive inpatient rehab setting. ?Physiatrist is providing close team supervision and 24 hour management of active medical problems listed below. ?Physiatrist and rehab team continue to assess barriers to discharge/monitor patient progress  toward functional and medical goals ? ?Care Tool: ? ?Bathing ?   ?Body parts bathed by patient: Right arm, Left arm, Chest, Abdomen, Front perineal area, Buttocks, Right upper leg, Left upper leg, Right lower leg, Left lower leg, Face  ?   ?  ?  ?Bathing assist Assist Level: Supervision/Verbal cueing ?  ?  ?Upper Body Dressing/Undressing ?Upper body dressing   ?What is the patient wearing?: Bra, Pull over shirt ?   ?Upper body assist Assist Level: Supervision/Verbal cueing ?   ?Lower Body Dressing/Undressing ?Lower body dressing ? ? ?   ?What is the patient wearing?: Underwear/pull up, Pants ? ?  ? ?Lower body assist Assist for lower body dressing: Supervision/Verbal cueing ?   ? ?Toileting ?Toileting    ?Toileting assist Assist for toileting: Supervision/Verbal cueing ?  ?  ?Transfers ?Chair/bed transfer ? ?Transfers assist ?   ? ?Chair/bed transfer assist level: Supervision/Verbal cueing ?Chair/bed transfer assistive device: Walker ?  ?Locomotion ?Ambulation ? ? ?Ambulation assist ? ?   ? ?Assist level: Contact Guard/Touching assist ?Assistive device: Walker-rolling ?Max distance: 150  ? ?Walk 10 feet activity ? ? ?Assist ?   ? ?Assist level: Supervision/Verbal cueing ?Assistive device: Walker-rolling  ? ?Walk 50 feet activity ? ? ?Assist Walk  50 feet with 2 turns activity did not occur: Safety/medical concerns ? ?Assist level: Contact Guard/Touching assist ?Assistive device: Walker-rolling  ? ? ?Walk 150 feet activity ? ? ?Assist Walk 150 feet activity did not occur: Safety/medical concerns ? ?Assist level: Contact Guard/Touching assist ?Assistive device: Walker-rolling ?  ? ?Walk 10 feet on uneven surface  ?activity ? ? ?Assist Walk 10 feet on uneven surfaces activity did not occur: Safety/medical concerns ? ? ?Assist level: Contact Guard/Touching assist ?Assistive device: Walker-rolling  ? ?Wheelchair ? ? ? ? ?Assist Is the patient using a wheelchair?: Yes ?  ?  ? ?Wheelchair assist level: Moderate Assistance -  Patient 50 - 74% ?Max wheelchair distance: 150  ? ? ?Wheelchair 50 feet with 2 turns activity ? ? ? ?Assist ? ?  ?  ? ? ?Assist Level: Moderate Assistance - Patient 50 - 74%  ? ?Wheelchair 150 feet activity  ? ? ? ?Assist ?   ? ? ?Assist Level: Moderate Assistance - Patient 50 - 74%  ? ?Blood pressure 123/65, pulse (!) 57, temperature 97.8 ?F (36.6 ?C), resp. rate 15, height 5\' 2"  (1.575 m), weight 78 kg, SpO2 100 %. ? ?Medical Problem List and Plan: ?1. 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            -patient may shower ?            -ELOS/Goals:S 10-14 days ?            d/c 4/6 ? Continue CIR- PT, OT and SLP ? Will d/w pt cannot drive or fly alone.  ? D/c today- will need f/u with me- hospital f/u.  ?2.  Antithrombotics: ?-DVT/anticoagulation:  Pharmaceutical: Lovenox ?            -antiplatelet therapy: DAPT X 3 months followed by ASA alone.  ?3. Pain along dorsum of right foot: Tylenol prn. Add voltaren gel prn ? 3/29- denies pain- con't regimen prn ? 4/3- still having aching pai in R foot- con't tylenol prn ?4. Mood: LCSW to follow for evaluation and support.  ?            -antipsychotic agents: N/A ?5. Neuropsych: This patient is not fully capable of making decisions on her own behalf. ?6. Bruising right arm: wrapped to prevent skin tears ?7. Fluids/Electrolytes/Nutrition: Monitor I/O.  ?8. HTN: Permissive hypertension first 7 days and then start normalizing.  ?--Encourage fluid intake.  ?- continue amlodipine 5mg  daily ?3/31- BP better with increase in norvasc already- con't regimen ?4/5- BP cotnrolled- con't regimen ?9. CKD: Baseline SCr- 1.33? Avoid nephrotoxic medications. Current creatinine is 1.16. Place nursing order to encourage 6-8 glasses of water per day. ?3/28- Cr 1.40- will recheck Thursday-  ?3/30- Cr up to 1.74 and BUN up to 32- from 25- will give NS IVFs 75cc/hour x 24 hours and push fluids and recheck in AM- she needs ot drink more- no renal impairing meds ? 3/31- drinking 2 cups/day-  encouraged 6-8 cups/day- waiting for labs results for change/new Cr/BUN ?4/3- Cr down to 1.34 and BUN down to 22- less dry- con't PO intake ?4/6- educated pt even when goes home, needs ot drink 6-8 cups/water daily to help kidneys function better- went over again.  ?10. Anxiety with depression: continue prozac.  ?11. Asthma/H/o Lung Ca-stage 1: Stable on Dulera. Continue regimen.  ?12. GERD: Used PPI intermittently at home.  ?--Daily Pepcid effective at this time.   ?13. Hyperactive bladder: Will resume Enablex for Vesicare (  family to bring in home med) ? 3/25 voided 4 x in last 24 hours ? 3/29- voiding- emptying.  ?14. Glaucoma: On Cosopt.  ?15. Vitamin D deficiency: start ergocalciferol 50,000U once per week for 7 weeks.  ?16. Orthostasis: no longer with orthostasis- discontinue 5am orthostatic vital signs.  ?17. Suboptimal magnesium level: 250mg  magnesium gluconate started HS.  ?18. Insomnia ? 3/27- started trazodone 50 mg QHS per her home dose.  ? 4/4- sleeping well ? ? ? ? ? ? ? ?LOS: ?15 days ?A FACE TO FACE EVALUATION WAS PERFORMED ? ?[name] ?10/09/2021, 9:09 AM  ? ?  ?

## 2021-10-11 DIAGNOSIS — Z9181 History of falling: Secondary | ICD-10-CM | POA: Diagnosis not present

## 2021-10-11 DIAGNOSIS — E559 Vitamin D deficiency, unspecified: Secondary | ICD-10-CM | POA: Diagnosis not present

## 2021-10-11 DIAGNOSIS — Z96652 Presence of left artificial knee joint: Secondary | ICD-10-CM | POA: Diagnosis not present

## 2021-10-11 DIAGNOSIS — I69351 Hemiplegia and hemiparesis following cerebral infarction affecting right dominant side: Secondary | ICD-10-CM | POA: Diagnosis not present

## 2021-10-11 DIAGNOSIS — M1990 Unspecified osteoarthritis, unspecified site: Secondary | ICD-10-CM | POA: Diagnosis not present

## 2021-10-11 DIAGNOSIS — Z85118 Personal history of other malignant neoplasm of bronchus and lung: Secondary | ICD-10-CM | POA: Diagnosis not present

## 2021-10-11 DIAGNOSIS — Z7951 Long term (current) use of inhaled steroids: Secondary | ICD-10-CM | POA: Diagnosis not present

## 2021-10-11 DIAGNOSIS — Z87891 Personal history of nicotine dependence: Secondary | ICD-10-CM | POA: Diagnosis not present

## 2021-10-11 DIAGNOSIS — M8580 Other specified disorders of bone density and structure, unspecified site: Secondary | ICD-10-CM | POA: Diagnosis not present

## 2021-10-11 DIAGNOSIS — H409 Unspecified glaucoma: Secondary | ICD-10-CM | POA: Diagnosis not present

## 2021-10-11 DIAGNOSIS — N1832 Chronic kidney disease, stage 3b: Secondary | ICD-10-CM | POA: Diagnosis not present

## 2021-10-11 DIAGNOSIS — Z7902 Long term (current) use of antithrombotics/antiplatelets: Secondary | ICD-10-CM | POA: Diagnosis not present

## 2021-10-11 DIAGNOSIS — I129 Hypertensive chronic kidney disease with stage 1 through stage 4 chronic kidney disease, or unspecified chronic kidney disease: Secondary | ICD-10-CM | POA: Diagnosis not present

## 2021-10-11 DIAGNOSIS — E781 Pure hyperglyceridemia: Secondary | ICD-10-CM | POA: Diagnosis not present

## 2021-10-11 DIAGNOSIS — K219 Gastro-esophageal reflux disease without esophagitis: Secondary | ICD-10-CM | POA: Diagnosis not present

## 2021-10-11 DIAGNOSIS — I63312 Cerebral infarction due to thrombosis of left middle cerebral artery: Secondary | ICD-10-CM | POA: Diagnosis not present

## 2021-10-11 DIAGNOSIS — Z7982 Long term (current) use of aspirin: Secondary | ICD-10-CM | POA: Diagnosis not present

## 2021-10-11 DIAGNOSIS — J45909 Unspecified asthma, uncomplicated: Secondary | ICD-10-CM | POA: Diagnosis not present

## 2021-10-11 DIAGNOSIS — I69320 Aphasia following cerebral infarction: Secondary | ICD-10-CM | POA: Diagnosis not present

## 2021-10-11 DIAGNOSIS — G4700 Insomnia, unspecified: Secondary | ICD-10-CM | POA: Diagnosis not present

## 2021-10-13 DIAGNOSIS — M8580 Other specified disorders of bone density and structure, unspecified site: Secondary | ICD-10-CM | POA: Diagnosis not present

## 2021-10-13 DIAGNOSIS — Z87891 Personal history of nicotine dependence: Secondary | ICD-10-CM | POA: Diagnosis not present

## 2021-10-13 DIAGNOSIS — I69351 Hemiplegia and hemiparesis following cerebral infarction affecting right dominant side: Secondary | ICD-10-CM | POA: Diagnosis not present

## 2021-10-13 DIAGNOSIS — Z96652 Presence of left artificial knee joint: Secondary | ICD-10-CM | POA: Diagnosis not present

## 2021-10-13 DIAGNOSIS — I69320 Aphasia following cerebral infarction: Secondary | ICD-10-CM | POA: Diagnosis not present

## 2021-10-13 DIAGNOSIS — G4700 Insomnia, unspecified: Secondary | ICD-10-CM

## 2021-10-13 DIAGNOSIS — R350 Frequency of micturition: Secondary | ICD-10-CM

## 2021-10-13 DIAGNOSIS — E781 Pure hyperglyceridemia: Secondary | ICD-10-CM | POA: Diagnosis not present

## 2021-10-13 DIAGNOSIS — K219 Gastro-esophageal reflux disease without esophagitis: Secondary | ICD-10-CM | POA: Diagnosis not present

## 2021-10-13 DIAGNOSIS — Z7902 Long term (current) use of antithrombotics/antiplatelets: Secondary | ICD-10-CM | POA: Diagnosis not present

## 2021-10-13 DIAGNOSIS — Z85118 Personal history of other malignant neoplasm of bronchus and lung: Secondary | ICD-10-CM | POA: Diagnosis not present

## 2021-10-13 DIAGNOSIS — Z7982 Long term (current) use of aspirin: Secondary | ICD-10-CM | POA: Diagnosis not present

## 2021-10-13 DIAGNOSIS — N1832 Chronic kidney disease, stage 3b: Secondary | ICD-10-CM | POA: Diagnosis not present

## 2021-10-13 DIAGNOSIS — H409 Unspecified glaucoma: Secondary | ICD-10-CM | POA: Diagnosis not present

## 2021-10-13 DIAGNOSIS — E559 Vitamin D deficiency, unspecified: Secondary | ICD-10-CM | POA: Diagnosis not present

## 2021-10-13 DIAGNOSIS — Z7951 Long term (current) use of inhaled steroids: Secondary | ICD-10-CM | POA: Diagnosis not present

## 2021-10-13 DIAGNOSIS — J45909 Unspecified asthma, uncomplicated: Secondary | ICD-10-CM | POA: Diagnosis not present

## 2021-10-13 DIAGNOSIS — N189 Chronic kidney disease, unspecified: Secondary | ICD-10-CM

## 2021-10-13 DIAGNOSIS — Z9181 History of falling: Secondary | ICD-10-CM | POA: Diagnosis not present

## 2021-10-13 DIAGNOSIS — M1990 Unspecified osteoarthritis, unspecified site: Secondary | ICD-10-CM | POA: Diagnosis not present

## 2021-10-13 DIAGNOSIS — I63312 Cerebral infarction due to thrombosis of left middle cerebral artery: Secondary | ICD-10-CM | POA: Diagnosis not present

## 2021-10-13 DIAGNOSIS — I129 Hypertensive chronic kidney disease with stage 1 through stage 4 chronic kidney disease, or unspecified chronic kidney disease: Secondary | ICD-10-CM | POA: Diagnosis not present

## 2021-10-14 ENCOUNTER — Telehealth: Payer: Self-pay

## 2021-10-14 NOTE — Telephone Encounter (Signed)
Transitional Care call--who you talked with Becky Sax, daughter ? ? ? ?Are you/is patient experiencing any problems since coming home?  NO Are there any questions regarding any aspect of care? NO ?Are there any questions regarding medications administration/dosing? NO Are meds being taken as prescribed? YES Patient should review meds with caller to confirm ?Have there been any falls? NO ?Has Home Health been to the house and/or have they contacted you? YES If not, have you tried to contact them? Can we help you contact them? ?Are bowels and bladder emptying properly? YES Are there any unexpected incontinence issues? If applicable, is patient following bowel/bladder programs? ?Any fevers, problems with breathing, unexpected pain? NO ?Are there any skin problems or new areas of breakdown? NO ?Has the patient/family member arranged specialty MD follow up (ie cardiology/neurology/renal/surgical/etc)?  YES Can we help arrange? NEEDS HELP WITH NEUROLOGY ?Does the patient need any other services or support that we can help arrange? NO ?Are caregivers following through as expected in assisting the patient? YES ?Has the patient quit smoking, drinking alcohol, or using drugs as recommended? YES ? ?Appointment time, arrive time and who it is with here 10/22/21 at 3 pm arrival time 2:40 pm with Danella Sensing, NP ?[address]   ?

## 2021-10-15 DIAGNOSIS — H409 Unspecified glaucoma: Secondary | ICD-10-CM | POA: Diagnosis not present

## 2021-10-15 DIAGNOSIS — Z7951 Long term (current) use of inhaled steroids: Secondary | ICD-10-CM | POA: Diagnosis not present

## 2021-10-15 DIAGNOSIS — Z96652 Presence of left artificial knee joint: Secondary | ICD-10-CM | POA: Diagnosis not present

## 2021-10-15 DIAGNOSIS — I129 Hypertensive chronic kidney disease with stage 1 through stage 4 chronic kidney disease, or unspecified chronic kidney disease: Secondary | ICD-10-CM | POA: Diagnosis not present

## 2021-10-15 DIAGNOSIS — M1990 Unspecified osteoarthritis, unspecified site: Secondary | ICD-10-CM | POA: Diagnosis not present

## 2021-10-15 DIAGNOSIS — J45909 Unspecified asthma, uncomplicated: Secondary | ICD-10-CM | POA: Diagnosis not present

## 2021-10-15 DIAGNOSIS — G4700 Insomnia, unspecified: Secondary | ICD-10-CM | POA: Diagnosis not present

## 2021-10-15 DIAGNOSIS — I63312 Cerebral infarction due to thrombosis of left middle cerebral artery: Secondary | ICD-10-CM | POA: Diagnosis not present

## 2021-10-15 DIAGNOSIS — Z7982 Long term (current) use of aspirin: Secondary | ICD-10-CM | POA: Diagnosis not present

## 2021-10-15 DIAGNOSIS — K219 Gastro-esophageal reflux disease without esophagitis: Secondary | ICD-10-CM | POA: Diagnosis not present

## 2021-10-15 DIAGNOSIS — I69320 Aphasia following cerebral infarction: Secondary | ICD-10-CM | POA: Diagnosis not present

## 2021-10-15 DIAGNOSIS — Z87891 Personal history of nicotine dependence: Secondary | ICD-10-CM | POA: Diagnosis not present

## 2021-10-15 DIAGNOSIS — E559 Vitamin D deficiency, unspecified: Secondary | ICD-10-CM | POA: Diagnosis not present

## 2021-10-15 DIAGNOSIS — Z85118 Personal history of other malignant neoplasm of bronchus and lung: Secondary | ICD-10-CM | POA: Diagnosis not present

## 2021-10-15 DIAGNOSIS — Z7902 Long term (current) use of antithrombotics/antiplatelets: Secondary | ICD-10-CM | POA: Diagnosis not present

## 2021-10-15 DIAGNOSIS — M8580 Other specified disorders of bone density and structure, unspecified site: Secondary | ICD-10-CM | POA: Diagnosis not present

## 2021-10-15 DIAGNOSIS — N1832 Chronic kidney disease, stage 3b: Secondary | ICD-10-CM | POA: Diagnosis not present

## 2021-10-15 DIAGNOSIS — E781 Pure hyperglyceridemia: Secondary | ICD-10-CM | POA: Diagnosis not present

## 2021-10-15 DIAGNOSIS — Z9181 History of falling: Secondary | ICD-10-CM | POA: Diagnosis not present

## 2021-10-15 DIAGNOSIS — I69351 Hemiplegia and hemiparesis following cerebral infarction affecting right dominant side: Secondary | ICD-10-CM | POA: Diagnosis not present

## 2021-10-16 DIAGNOSIS — R531 Weakness: Secondary | ICD-10-CM | POA: Diagnosis not present

## 2021-10-16 DIAGNOSIS — J4520 Mild intermittent asthma, uncomplicated: Secondary | ICD-10-CM | POA: Diagnosis not present

## 2021-10-16 DIAGNOSIS — Z8673 Personal history of transient ischemic attack (TIA), and cerebral infarction without residual deficits: Secondary | ICD-10-CM | POA: Diagnosis not present

## 2021-10-17 DIAGNOSIS — Z7982 Long term (current) use of aspirin: Secondary | ICD-10-CM | POA: Diagnosis not present

## 2021-10-17 DIAGNOSIS — G4700 Insomnia, unspecified: Secondary | ICD-10-CM | POA: Diagnosis not present

## 2021-10-17 DIAGNOSIS — Z9181 History of falling: Secondary | ICD-10-CM | POA: Diagnosis not present

## 2021-10-17 DIAGNOSIS — H409 Unspecified glaucoma: Secondary | ICD-10-CM | POA: Diagnosis not present

## 2021-10-17 DIAGNOSIS — Z7951 Long term (current) use of inhaled steroids: Secondary | ICD-10-CM | POA: Diagnosis not present

## 2021-10-17 DIAGNOSIS — J45909 Unspecified asthma, uncomplicated: Secondary | ICD-10-CM | POA: Diagnosis not present

## 2021-10-17 DIAGNOSIS — I69320 Aphasia following cerebral infarction: Secondary | ICD-10-CM | POA: Diagnosis not present

## 2021-10-17 DIAGNOSIS — E559 Vitamin D deficiency, unspecified: Secondary | ICD-10-CM | POA: Diagnosis not present

## 2021-10-17 DIAGNOSIS — Z96652 Presence of left artificial knee joint: Secondary | ICD-10-CM | POA: Diagnosis not present

## 2021-10-17 DIAGNOSIS — I129 Hypertensive chronic kidney disease with stage 1 through stage 4 chronic kidney disease, or unspecified chronic kidney disease: Secondary | ICD-10-CM | POA: Diagnosis not present

## 2021-10-17 DIAGNOSIS — I69351 Hemiplegia and hemiparesis following cerebral infarction affecting right dominant side: Secondary | ICD-10-CM | POA: Diagnosis not present

## 2021-10-17 DIAGNOSIS — N1832 Chronic kidney disease, stage 3b: Secondary | ICD-10-CM | POA: Diagnosis not present

## 2021-10-17 DIAGNOSIS — Z7902 Long term (current) use of antithrombotics/antiplatelets: Secondary | ICD-10-CM | POA: Diagnosis not present

## 2021-10-17 DIAGNOSIS — Z85118 Personal history of other malignant neoplasm of bronchus and lung: Secondary | ICD-10-CM | POA: Diagnosis not present

## 2021-10-17 DIAGNOSIS — E781 Pure hyperglyceridemia: Secondary | ICD-10-CM | POA: Diagnosis not present

## 2021-10-17 DIAGNOSIS — M8580 Other specified disorders of bone density and structure, unspecified site: Secondary | ICD-10-CM | POA: Diagnosis not present

## 2021-10-17 DIAGNOSIS — Z87891 Personal history of nicotine dependence: Secondary | ICD-10-CM | POA: Diagnosis not present

## 2021-10-17 DIAGNOSIS — K219 Gastro-esophageal reflux disease without esophagitis: Secondary | ICD-10-CM | POA: Diagnosis not present

## 2021-10-17 DIAGNOSIS — M1990 Unspecified osteoarthritis, unspecified site: Secondary | ICD-10-CM | POA: Diagnosis not present

## 2021-10-17 DIAGNOSIS — I63312 Cerebral infarction due to thrombosis of left middle cerebral artery: Secondary | ICD-10-CM | POA: Diagnosis not present

## 2021-10-20 DIAGNOSIS — G4700 Insomnia, unspecified: Secondary | ICD-10-CM | POA: Diagnosis not present

## 2021-10-20 DIAGNOSIS — Z7902 Long term (current) use of antithrombotics/antiplatelets: Secondary | ICD-10-CM | POA: Diagnosis not present

## 2021-10-20 DIAGNOSIS — E781 Pure hyperglyceridemia: Secondary | ICD-10-CM | POA: Diagnosis not present

## 2021-10-20 DIAGNOSIS — I129 Hypertensive chronic kidney disease with stage 1 through stage 4 chronic kidney disease, or unspecified chronic kidney disease: Secondary | ICD-10-CM | POA: Diagnosis not present

## 2021-10-20 DIAGNOSIS — E559 Vitamin D deficiency, unspecified: Secondary | ICD-10-CM | POA: Diagnosis not present

## 2021-10-20 DIAGNOSIS — Z7982 Long term (current) use of aspirin: Secondary | ICD-10-CM | POA: Diagnosis not present

## 2021-10-20 DIAGNOSIS — Z9181 History of falling: Secondary | ICD-10-CM | POA: Diagnosis not present

## 2021-10-20 DIAGNOSIS — M8580 Other specified disorders of bone density and structure, unspecified site: Secondary | ICD-10-CM | POA: Diagnosis not present

## 2021-10-20 DIAGNOSIS — J45909 Unspecified asthma, uncomplicated: Secondary | ICD-10-CM | POA: Diagnosis not present

## 2021-10-20 DIAGNOSIS — Z85118 Personal history of other malignant neoplasm of bronchus and lung: Secondary | ICD-10-CM | POA: Diagnosis not present

## 2021-10-20 DIAGNOSIS — K219 Gastro-esophageal reflux disease without esophagitis: Secondary | ICD-10-CM | POA: Diagnosis not present

## 2021-10-20 DIAGNOSIS — Z96652 Presence of left artificial knee joint: Secondary | ICD-10-CM | POA: Diagnosis not present

## 2021-10-20 DIAGNOSIS — Z87891 Personal history of nicotine dependence: Secondary | ICD-10-CM | POA: Diagnosis not present

## 2021-10-20 DIAGNOSIS — I63312 Cerebral infarction due to thrombosis of left middle cerebral artery: Secondary | ICD-10-CM | POA: Diagnosis not present

## 2021-10-20 DIAGNOSIS — I69351 Hemiplegia and hemiparesis following cerebral infarction affecting right dominant side: Secondary | ICD-10-CM | POA: Diagnosis not present

## 2021-10-20 DIAGNOSIS — M1990 Unspecified osteoarthritis, unspecified site: Secondary | ICD-10-CM | POA: Diagnosis not present

## 2021-10-20 DIAGNOSIS — N1832 Chronic kidney disease, stage 3b: Secondary | ICD-10-CM | POA: Diagnosis not present

## 2021-10-20 DIAGNOSIS — Z7951 Long term (current) use of inhaled steroids: Secondary | ICD-10-CM | POA: Diagnosis not present

## 2021-10-20 DIAGNOSIS — H409 Unspecified glaucoma: Secondary | ICD-10-CM | POA: Diagnosis not present

## 2021-10-20 DIAGNOSIS — I69320 Aphasia following cerebral infarction: Secondary | ICD-10-CM | POA: Diagnosis not present

## 2021-10-21 DIAGNOSIS — M1990 Unspecified osteoarthritis, unspecified site: Secondary | ICD-10-CM | POA: Diagnosis not present

## 2021-10-21 DIAGNOSIS — K219 Gastro-esophageal reflux disease without esophagitis: Secondary | ICD-10-CM | POA: Diagnosis not present

## 2021-10-21 DIAGNOSIS — G4700 Insomnia, unspecified: Secondary | ICD-10-CM | POA: Diagnosis not present

## 2021-10-21 DIAGNOSIS — I69351 Hemiplegia and hemiparesis following cerebral infarction affecting right dominant side: Secondary | ICD-10-CM | POA: Diagnosis not present

## 2021-10-21 DIAGNOSIS — Z85118 Personal history of other malignant neoplasm of bronchus and lung: Secondary | ICD-10-CM | POA: Diagnosis not present

## 2021-10-21 DIAGNOSIS — E559 Vitamin D deficiency, unspecified: Secondary | ICD-10-CM | POA: Diagnosis not present

## 2021-10-21 DIAGNOSIS — I129 Hypertensive chronic kidney disease with stage 1 through stage 4 chronic kidney disease, or unspecified chronic kidney disease: Secondary | ICD-10-CM | POA: Diagnosis not present

## 2021-10-21 DIAGNOSIS — Z7982 Long term (current) use of aspirin: Secondary | ICD-10-CM | POA: Diagnosis not present

## 2021-10-21 DIAGNOSIS — H409 Unspecified glaucoma: Secondary | ICD-10-CM | POA: Diagnosis not present

## 2021-10-21 DIAGNOSIS — Z87891 Personal history of nicotine dependence: Secondary | ICD-10-CM | POA: Diagnosis not present

## 2021-10-21 DIAGNOSIS — Z7902 Long term (current) use of antithrombotics/antiplatelets: Secondary | ICD-10-CM | POA: Diagnosis not present

## 2021-10-21 DIAGNOSIS — I69320 Aphasia following cerebral infarction: Secondary | ICD-10-CM | POA: Diagnosis not present

## 2021-10-21 DIAGNOSIS — M8580 Other specified disorders of bone density and structure, unspecified site: Secondary | ICD-10-CM | POA: Diagnosis not present

## 2021-10-21 DIAGNOSIS — J45909 Unspecified asthma, uncomplicated: Secondary | ICD-10-CM | POA: Diagnosis not present

## 2021-10-21 DIAGNOSIS — Z7951 Long term (current) use of inhaled steroids: Secondary | ICD-10-CM | POA: Diagnosis not present

## 2021-10-21 DIAGNOSIS — Z96652 Presence of left artificial knee joint: Secondary | ICD-10-CM | POA: Diagnosis not present

## 2021-10-21 DIAGNOSIS — E781 Pure hyperglyceridemia: Secondary | ICD-10-CM | POA: Diagnosis not present

## 2021-10-21 DIAGNOSIS — Z9181 History of falling: Secondary | ICD-10-CM | POA: Diagnosis not present

## 2021-10-21 DIAGNOSIS — N1832 Chronic kidney disease, stage 3b: Secondary | ICD-10-CM | POA: Diagnosis not present

## 2021-10-21 DIAGNOSIS — I63312 Cerebral infarction due to thrombosis of left middle cerebral artery: Secondary | ICD-10-CM | POA: Diagnosis not present

## 2021-10-22 ENCOUNTER — Ambulatory Visit: Payer: Medicare Other | Admitting: Physician Assistant

## 2021-10-22 ENCOUNTER — Encounter: Payer: Medicare Other | Attending: Registered Nurse | Admitting: Registered Nurse

## 2021-10-22 VITALS — BP 133/80 | HR 60 | Ht 62.0 in | Wt 174.0 lb

## 2021-10-22 DIAGNOSIS — F5101 Primary insomnia: Secondary | ICD-10-CM | POA: Diagnosis not present

## 2021-10-22 DIAGNOSIS — R350 Frequency of micturition: Secondary | ICD-10-CM | POA: Insufficient documentation

## 2021-10-22 DIAGNOSIS — I10 Essential (primary) hypertension: Secondary | ICD-10-CM | POA: Diagnosis not present

## 2021-10-22 DIAGNOSIS — I63312 Cerebral infarction due to thrombosis of left middle cerebral artery: Secondary | ICD-10-CM | POA: Diagnosis not present

## 2021-10-22 NOTE — Progress Notes (Signed)
? ?Subjective:  ? ? Patient ID: [name], female    DOB: 1941/03/31, 81 y.o.   MRN: [id number] ? ?HPI: [name] is a 81 y.o. 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 She was admitted on 09/21/2021, after a fall.  ?H&P Dr Trifan:09/21/2021 ?[name] is a 81 y.o. female presenting from home with a fall, daughter having concerns about difficulty with speech.  Her daughter provides supplemental history reports that she last spoke to the patient yesterday evening at 7 PM and the patient was speaking normally.  Today she received a phone call from the patient at 32 as the patient had fallen and was having difficulty at that time getting her words out. ?Neurology Consulted.  ? ?CT Head WO Contrast: CT Cervical Spine  ?  ?IMPRESSION: ?1. No acute intracranial abnormality. Small right frontoparietal ?scalp hematoma. ?2. No acute cervical spine fracture or subluxation. ?3. Chronic lacunar infarcts in the left caudate head, new since ?2016. ?  ?CT Angio:  ?IMPRESSION: ?1. Occlusion of a distal left MCA M2 branch, at the posterior ?Sylvian fissure. This corresponds to the location of the previously ?identified infarct. ?2. 70% stenosis of the proximal right ICA secondary to predominantly ?calcified atherosclerosis. ?3. At the time of dictation there is a processing error with the ?perfusion scan. If this can be corrected, I will provide an addendum ?with the results. ? ?MR Brain:  ?IMPRESSION: ?1. Early subacute infarct of the left MCA territory involving the ?left parietal and temporal lobes. No hemorrhage or mass effect. ?2. Old infarcts of the left basal ganglia and both cerebellar ?hemispheres. ?  ?Dr Erlinda Hong Recommended DAPTx 3 months then ASA alone.  ? ?[name] was admitted to inpatient rehabilitation on 09/24/2021 and discharged home on 10/09/2021. She  is receiving Home Health Therapy with [facility]. She also reports she has a good appetite and denies any pain. She rates her pain 0.  ? ?Daughter in room, she had questions regarding her mother statying by herself in the home, message was sent to Dr Dagoberto Ligas.  ?At times [name] still have difficulty with word finding and still needs verbal cues for awareness of verbal errors.  ?Pain Inventory ?Average Pain 3 ?Pain Right Now 0 ?My pain is intermittent ? ?LOCATION OF PAIN  right ankle ? ?BOWEL ?Number of stools per week: 7 ?Oral laxative use No  ?Type of laxative . ?Enema or suppository use No  ?History of colostomy No  ?Incontinent No  ? ?BLADDER ?Normal ?In and out cath, frequency . ?Able to self cath  . ?Bladder incontinence No  ?Frequent urination No  ?Leakage with coughing No  ?Difficulty starting stream No  ?Incomplete bladder emptying No  ? ? ?Mobility ?use a walker ?ability to climb steps?  no ?do you drive?  no ? ?Function ?retired ?I need assistance with the following:  dressing, bathing, meal prep, household duties, and shopping ? ?Neuro/Psych ?weakness ?trouble walking ? ?Prior Studies ?TC appt ? ?Physicians involved in your care ?TC appt ? ? ?Family History  ?Problem Relation Age of Onset  ?? Stroke Father   ?? Asthma Father   ?? Heart disease Father   ?     CABG x 2  ?? Lung cancer Sister   ?     smoked  ?? Breast cancer Daughter 70  ? ?Social History  ? ?Socioeconomic History  ??  Marital status: Widowed  ?  Spouse name: Not on file  ?? Number of children: 2  ?? Years of education: Not on file  ?? Highest education level: Not on file  ?Occupational History  ?? 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 Smoking status: Former  ?  Packs/day: 0.75  ?  Years: 20.00  ?  Pack years: 15.00  ?  Types: Cigarettes  ?  Quit date: 63  ?  Years since quitting: 43.3  ?? Smokeless tobacco: Never  ?Vaping Use  ?? Vaping Use: Never used  ?Substance and Sexual Activity  ?? Alcohol use: Yes  ?  Alcohol/week:  1.0 standard drink  ?  Types: 1 Glasses of wine per week  ?  Comment: occasional  ?? Drug use: No  ?? Sexual activity: Not on file  ?Other Topics Concern  ?? Not on file  ?Social History Narrative  ? ** Merged History Encounter **  ?    ? ?Social Determinants of Health  ? ?Financial Resource Strain: Not on file  ?Food Insecurity: Not on file  ?Transportation Needs: Not on file  ?Physical Activity: Not on file  ?Stress: Not on file  ?Social Connections: Not on file  ? ?Past Surgical History:  ?Procedure Laterality Date  ?? APPENDECTOMY    ?? COLONOSCOPY    ?? EYE SURGERY Left   ? cataract removal  ?? ORIF WRIST FRACTURE Left 08/04/2013  ? Procedure: OPEN REDUCTION INTERNAL FIXATION (ORIF) LEFT DISTAL RADIUS WRIST FRACTURE;  Surgeon: Wynonia Sours, MD;  Location: [place];  Service: Orthopedics;  Laterality: Left;  ?? PARTIAL HYSTERECTOMY    ?? TOTAL KNEE ARTHROPLASTY Left 08/26/2018  ? Procedure: TOTAL KNEE ARTHROPLASTY;  Surgeon: Netta Cedars, MD;  Location: [facility];  Service: Orthopedics;  Laterality: Left;  ?? VIDEO ASSISTED THORACOSCOPY (VATS)/WEDGE RESECTION Right 02/12/2014  ? Procedure: RIGHT VIDEO ASSISTED THORACOSCOPY RIGHT LOWER LOBE LUNG /WEDGE RESECTION,RIGHT THORACOTOMY WITH RIGHT LOWER LOBE LOBECTOMY & NODE DISSECTION;  Surgeon: Melrose Nakayama, MD;  Location: [place];  Service: Thoracic;  Laterality: Right;  ?? VIDEO ASSISTED THORACOSCOPY (VATS)/WEDGE RESECTION Right 02/12/2014  ?? VIDEO BRONCHOSCOPY N/A 02/12/2014  ? Procedure: VIDEO BRONCHOSCOPY;  Surgeon: Melrose Nakayama, MD;  Location: [place];  Service: Thoracic;  Laterality: N/A;  ? ?Past Medical History:  ?Diagnosis Date  ??  Multiple pulmonary nodules, largest R mid lung 06/29/2013  ? Followed in Pulmonary clinic/ [facility]/ [facility] - see CXR 06/27/13  - CT chest 07/14/2013 > 1. No acute findings in the thorax to account for the patient's  symptoms.  2. However, there is a subsolid nodule in the   right lower lobe that has  a ground-glass attenuation component  measuring 2.5 x 1.8 cm, and a small central solid component  measuring 4 mm. Initial follow-up by chest CT without   ?? Adenocarcinoma of lung, stage 1 ([place])   ? Status post right lower lobectomy August 2015  ?? Anxiety   ?? Asthma   ?? Atrophic vaginitis   ?? Chronic cough 12/20/2018  ?? Chronic iritis of left eye   ? Pupil stays dilated; nonreactive  ?? Chronic kidney disease, stage 3b ([place])   ?? Colon polyp   ?? Contact lens/glasses fitting   ? wears contacts or glasses  ?? 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 Followed in Pulmonary clinic/ [facility]/ [facility] in her 40's - Spirometry 02/23/13 wnl  - 07/03/2013 Sinus CT >  Mild chronic sinus disease - No acute findings. Left-to-right nasal septal deviation of 3 mm. -med calendar 08/08/13 , redone 06/01/2016 and 02/22/2017  - Eos 4%   10/13/2013  > rec singulair daily  - FENO 05/12/2016  =   24 on singulair  - Allergy profile 05/12/2016 >   IgE  47 pos  ?? Depression   ?? Depression with anxiety   ?? Dysuria 08/03/2019  ?? Environmental allergies   ?? Essential hypertension 02/23/2017  ? Changed losartan to avapro 02/22/2017 due to cough   ?? GERD (gastroesophageal reflux disease)   ?? Hypertension   ?? Hypertriglyceridemia   ?? Kidney stones   ?? OA (osteoarthritis)   ?? Osteopenia   ?? PONV (postoperative nausea and vomiting)   ?? S/P lobectomy of lung 02/12/2014  ?? Status post total knee replacement, left 08/26/2018  ?? 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 ?BP 133/80   Pulse (!) 57   Ht 5\' 2"  (1.575 m)   Wt 174 lb (78.9 kg)   SpO2 98%   BMI 31.83 kg/m?  ? ?Opioid Risk Score:   ?Fall Risk Score:  `1 ? ?Depression screen PHQ 2/9 ? ? ?  10/22/2021  ?  3:18 PM  ?Depression screen PHQ 2/9  ?Decreased Interest 0  ?Down, Depressed, Hopeless 0  ?PHQ - 2 Score 0  ?Altered sleeping 0  ?Tired, decreased energy 1  ?Change in appetite 0  ?Feeling bad or failure about yourself  0  ?Trouble concentrating 3   ?Moving slowly or fidgety/restless 2  ?Suicidal thoughts 0  ?PHQ-9 Score 6  ?Difficult doing work/chores Very difficult  ?  ? ?Review of Systems  ?Musculoskeletal:  Positive for gait problem.  ?Neurological:

## 2021-10-23 ENCOUNTER — Telehealth: Payer: Self-pay | Admitting: Registered Nurse

## 2021-10-23 ENCOUNTER — Ambulatory Visit: Payer: Medicare Other | Admitting: Adult Health

## 2021-10-23 DIAGNOSIS — I129 Hypertensive chronic kidney disease with stage 1 through stage 4 chronic kidney disease, or unspecified chronic kidney disease: Secondary | ICD-10-CM | POA: Diagnosis not present

## 2021-10-23 DIAGNOSIS — Z7902 Long term (current) use of antithrombotics/antiplatelets: Secondary | ICD-10-CM | POA: Diagnosis not present

## 2021-10-23 DIAGNOSIS — N1832 Chronic kidney disease, stage 3b: Secondary | ICD-10-CM | POA: Diagnosis not present

## 2021-10-23 DIAGNOSIS — H409 Unspecified glaucoma: Secondary | ICD-10-CM | POA: Diagnosis not present

## 2021-10-23 DIAGNOSIS — Z7982 Long term (current) use of aspirin: Secondary | ICD-10-CM | POA: Diagnosis not present

## 2021-10-23 DIAGNOSIS — I69320 Aphasia following cerebral infarction: Secondary | ICD-10-CM | POA: Diagnosis not present

## 2021-10-23 DIAGNOSIS — Z87891 Personal history of nicotine dependence: Secondary | ICD-10-CM | POA: Diagnosis not present

## 2021-10-23 DIAGNOSIS — E559 Vitamin D deficiency, unspecified: Secondary | ICD-10-CM | POA: Diagnosis not present

## 2021-10-23 DIAGNOSIS — Z9181 History of falling: Secondary | ICD-10-CM | POA: Diagnosis not present

## 2021-10-23 DIAGNOSIS — K219 Gastro-esophageal reflux disease without esophagitis: Secondary | ICD-10-CM | POA: Diagnosis not present

## 2021-10-23 DIAGNOSIS — Z85118 Personal history of other malignant neoplasm of bronchus and lung: Secondary | ICD-10-CM | POA: Diagnosis not present

## 2021-10-23 DIAGNOSIS — E781 Pure hyperglyceridemia: Secondary | ICD-10-CM | POA: Diagnosis not present

## 2021-10-23 DIAGNOSIS — M1990 Unspecified osteoarthritis, unspecified site: Secondary | ICD-10-CM | POA: Diagnosis not present

## 2021-10-23 DIAGNOSIS — M8580 Other specified disorders of bone density and structure, unspecified site: Secondary | ICD-10-CM | POA: Diagnosis not present

## 2021-10-23 DIAGNOSIS — I69351 Hemiplegia and hemiparesis following cerebral infarction affecting right dominant side: Secondary | ICD-10-CM | POA: Diagnosis not present

## 2021-10-23 DIAGNOSIS — Z96652 Presence of left artificial knee joint: Secondary | ICD-10-CM | POA: Diagnosis not present

## 2021-10-23 DIAGNOSIS — J45909 Unspecified asthma, uncomplicated: Secondary | ICD-10-CM | POA: Diagnosis not present

## 2021-10-23 DIAGNOSIS — Z7951 Long term (current) use of inhaled steroids: Secondary | ICD-10-CM | POA: Diagnosis not present

## 2021-10-23 DIAGNOSIS — G4700 Insomnia, unspecified: Secondary | ICD-10-CM | POA: Diagnosis not present

## 2021-10-23 DIAGNOSIS — I63312 Cerebral infarction due to thrombosis of left middle cerebral artery: Secondary | ICD-10-CM | POA: Diagnosis not present

## 2021-10-23 NOTE — Telephone Encounter (Signed)
Dr Dagoberto Ligas,  ?Ms. Hove was seen on yesterday for Transitional Care Visit.  ?Her daughter states she has to leave to go home, her home is in [name].  ?She was discharged on 10/09/2021, at the time of discharge she was to provide 24/7 care for her.  ?She has agency care during the day.  ?The daughter stated she is a Electrical engineer and she left Ms. Rill last Sunday Night at 8:00 pm, had a neighbor check on her at 10:00 pm and the agency care aide arrived at 7:30 am. She sates her mother dis well. ?The daughter also states she ( Ms. Doro)has life alert and a phone will be by her bedside at all times.  ?Her daughter was wondering if she could leave Ms. Edberg, I've only seen this patient on yesterday and I am asking for your input.  ?Ms. Delprado still at times have difficulty with word finding and needs verbal cues.  ?I asked her daughter if the neighbor would be willing to check on her on a daily basis, she will ask and let me know when I give her a call with your answer. The daughter is aware, I would be sending you the above message.  ?I will await your response.  ?

## 2021-10-23 NOTE — Telephone Encounter (Signed)
Was in contact with Dr Dagoberto Ligas , regarding  ?[name] daughter questions.  ?Placed a call to [name], with Dr Dagoberto Ligas recommendations ?[name] ( daughter), verbalizes understanding, she will continue to provide 24/7 care for [name].  ?

## 2021-10-24 DIAGNOSIS — G4700 Insomnia, unspecified: Secondary | ICD-10-CM | POA: Diagnosis not present

## 2021-10-24 DIAGNOSIS — I69351 Hemiplegia and hemiparesis following cerebral infarction affecting right dominant side: Secondary | ICD-10-CM | POA: Diagnosis not present

## 2021-10-24 DIAGNOSIS — I129 Hypertensive chronic kidney disease with stage 1 through stage 4 chronic kidney disease, or unspecified chronic kidney disease: Secondary | ICD-10-CM | POA: Diagnosis not present

## 2021-10-24 DIAGNOSIS — H409 Unspecified glaucoma: Secondary | ICD-10-CM | POA: Diagnosis not present

## 2021-10-24 DIAGNOSIS — J45909 Unspecified asthma, uncomplicated: Secondary | ICD-10-CM | POA: Diagnosis not present

## 2021-10-24 DIAGNOSIS — I69320 Aphasia following cerebral infarction: Secondary | ICD-10-CM | POA: Diagnosis not present

## 2021-10-24 DIAGNOSIS — Z7902 Long term (current) use of antithrombotics/antiplatelets: Secondary | ICD-10-CM | POA: Diagnosis not present

## 2021-10-24 DIAGNOSIS — Z9181 History of falling: Secondary | ICD-10-CM | POA: Diagnosis not present

## 2021-10-24 DIAGNOSIS — M1990 Unspecified osteoarthritis, unspecified site: Secondary | ICD-10-CM | POA: Diagnosis not present

## 2021-10-24 DIAGNOSIS — Z7951 Long term (current) use of inhaled steroids: Secondary | ICD-10-CM | POA: Diagnosis not present

## 2021-10-24 DIAGNOSIS — Z96652 Presence of left artificial knee joint: Secondary | ICD-10-CM | POA: Diagnosis not present

## 2021-10-24 DIAGNOSIS — Z85118 Personal history of other malignant neoplasm of bronchus and lung: Secondary | ICD-10-CM | POA: Diagnosis not present

## 2021-10-24 DIAGNOSIS — Z87891 Personal history of nicotine dependence: Secondary | ICD-10-CM | POA: Diagnosis not present

## 2021-10-24 DIAGNOSIS — E559 Vitamin D deficiency, unspecified: Secondary | ICD-10-CM | POA: Diagnosis not present

## 2021-10-24 DIAGNOSIS — E781 Pure hyperglyceridemia: Secondary | ICD-10-CM | POA: Diagnosis not present

## 2021-10-24 DIAGNOSIS — Z7982 Long term (current) use of aspirin: Secondary | ICD-10-CM | POA: Diagnosis not present

## 2021-10-24 DIAGNOSIS — N1832 Chronic kidney disease, stage 3b: Secondary | ICD-10-CM | POA: Diagnosis not present

## 2021-10-24 DIAGNOSIS — I63312 Cerebral infarction due to thrombosis of left middle cerebral artery: Secondary | ICD-10-CM | POA: Diagnosis not present

## 2021-10-24 DIAGNOSIS — K219 Gastro-esophageal reflux disease without esophagitis: Secondary | ICD-10-CM | POA: Diagnosis not present

## 2021-10-24 DIAGNOSIS — M8580 Other specified disorders of bone density and structure, unspecified site: Secondary | ICD-10-CM | POA: Diagnosis not present

## 2021-10-27 ENCOUNTER — Encounter: Payer: Medicare Other | Admitting: Registered Nurse

## 2021-10-27 DIAGNOSIS — Z96652 Presence of left artificial knee joint: Secondary | ICD-10-CM | POA: Diagnosis not present

## 2021-10-27 DIAGNOSIS — Z9181 History of falling: Secondary | ICD-10-CM | POA: Diagnosis not present

## 2021-10-27 DIAGNOSIS — I63312 Cerebral infarction due to thrombosis of left middle cerebral artery: Secondary | ICD-10-CM | POA: Diagnosis not present

## 2021-10-27 DIAGNOSIS — H409 Unspecified glaucoma: Secondary | ICD-10-CM | POA: Diagnosis not present

## 2021-10-27 DIAGNOSIS — Z7902 Long term (current) use of antithrombotics/antiplatelets: Secondary | ICD-10-CM | POA: Diagnosis not present

## 2021-10-27 DIAGNOSIS — N1832 Chronic kidney disease, stage 3b: Secondary | ICD-10-CM | POA: Diagnosis not present

## 2021-10-27 DIAGNOSIS — I69351 Hemiplegia and hemiparesis following cerebral infarction affecting right dominant side: Secondary | ICD-10-CM | POA: Diagnosis not present

## 2021-10-27 DIAGNOSIS — Z87891 Personal history of nicotine dependence: Secondary | ICD-10-CM | POA: Diagnosis not present

## 2021-10-27 DIAGNOSIS — E559 Vitamin D deficiency, unspecified: Secondary | ICD-10-CM | POA: Diagnosis not present

## 2021-10-27 DIAGNOSIS — K219 Gastro-esophageal reflux disease without esophagitis: Secondary | ICD-10-CM | POA: Diagnosis not present

## 2021-10-27 DIAGNOSIS — M1990 Unspecified osteoarthritis, unspecified site: Secondary | ICD-10-CM | POA: Diagnosis not present

## 2021-10-27 DIAGNOSIS — J45909 Unspecified asthma, uncomplicated: Secondary | ICD-10-CM | POA: Diagnosis not present

## 2021-10-27 DIAGNOSIS — I129 Hypertensive chronic kidney disease with stage 1 through stage 4 chronic kidney disease, or unspecified chronic kidney disease: Secondary | ICD-10-CM | POA: Diagnosis not present

## 2021-10-27 DIAGNOSIS — G4700 Insomnia, unspecified: Secondary | ICD-10-CM | POA: Diagnosis not present

## 2021-10-27 DIAGNOSIS — Z7982 Long term (current) use of aspirin: Secondary | ICD-10-CM | POA: Diagnosis not present

## 2021-10-27 DIAGNOSIS — Z7951 Long term (current) use of inhaled steroids: Secondary | ICD-10-CM | POA: Diagnosis not present

## 2021-10-27 DIAGNOSIS — I69320 Aphasia following cerebral infarction: Secondary | ICD-10-CM | POA: Diagnosis not present

## 2021-10-27 DIAGNOSIS — Z85118 Personal history of other malignant neoplasm of bronchus and lung: Secondary | ICD-10-CM | POA: Diagnosis not present

## 2021-10-27 DIAGNOSIS — M8580 Other specified disorders of bone density and structure, unspecified site: Secondary | ICD-10-CM | POA: Diagnosis not present

## 2021-10-27 DIAGNOSIS — E781 Pure hyperglyceridemia: Secondary | ICD-10-CM | POA: Diagnosis not present

## 2021-10-27 NOTE — Progress Notes (Signed)
?Cardiology Office Note:   ? ?Date:  10/28/2021  ? ?ID:  [name], DOB 09/27/1940, MRN [id number] ? ?PCP:  Faustino Congress, NP ?  ?[facility] Providers ?Cardiologist:  Larae Grooms, MD    ? ?Referring MD: Faustino Congress, NP  ? ?Chief Complaint: Hospital follow-up s/p stroke ? ?History of Present Illness:   ? ?[name] is a 81 y.o. female with a hx of hypertension, thrombotic stroke involving left middle cerebral artery, asthma, lung cancer, coronary calcification seen on CT and CKD.  ? ?She established care in 11/14/2019 for evaluation of coronary artery calcifications and was seen by Dr. Irish Lack. 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  Coronary CTA revealed coronary calcium score of 148, 59th percentile for age and sex matched control.  Dr. Irish Lack advised that there was no significant CAD, one artery could not be visualized well so symptoms of chest tightness persist would consider stress test.   ? ?Admission 3/19-3/22/23 for suspected stroke following a fall. Had aphasia and right lower extremity weakness upon arrival to ED. Her daughter had spoken with her the evening prior at 7 PM and the patient was speaking normally.  On day of admission she spoke with the patient at 11 AM and the patient had fallen and was having difficulty getting her words out. CTA revealed occluded distal left MCA M2 branch at posterior sylvian fissure.  Echocardiogram 09/22/2021 revealed normal LVEF 60 to 38%, grade 1 diastolic dysfunction, no significant valve disease.  Outpatient 30-day cardiac monitor was recommended to rule out atrial fibrillation.  Patient advised to continue aspirin 81 mg daily and Plavix 75 mg daily for 3 months and then aspirin alone.  She was transferred to  acute inpatient rehab and was discharged home on 4/6.  ? ?Today, she is here with her caregiver [place]. Patient reports she is doing well and is spending some hours at home alone.  She is using a walker to get around.  She reports that her strength has returned to near normal.  She denies symptoms of chest pain, palpitations, or other cardiac concerns prior to stroke.  She denies chest pain throughout PT/recovery. Today, she has no specific cardiac complaints and denies chest pain, shortness of breath, lower extremity edema, fatigue, palpitations, melena, hematuria, hemoptysis, diaphoresis, weakness, presyncope, syncope, orthopnea, and PND.  ? ?Past Medical History:  ?Diagnosis Date  ?  Multiple pulmonary nodules, largest R mid lung 06/29/2013  ? Followed in Pulmonary clinic/ [facility]/ [place] - see CXR 06/27/13  - CT chest 07/14/2013 > 1. No acute findings in the thorax to account for the patient's  symptoms.  2. However, there is a subsolid nodule in the   right lower lobe that has a ground-glass attenuation component  measuring 2.5 x 1.8 cm, and a small central solid component  measuring 4 mm. Initial follow-up by chest CT without   ? Adenocarcinoma of lung, stage 1 ([place])   ? Status post right lower lobectomy August 2015  ? Anxiety   ? Asthma   ? Atrophic vaginitis   ? Chronic cough 12/20/2018  ? Chronic iritis of left eye   ? Pupil stays dilated; nonreactive  ? Chronic kidney disease, stage 3b ([place])   ? Colon polyp   ? Contact lens/glasses fitting   ?  wears contacts or glasses  ? 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 Followed in Pulmonary clinic/ [facility]/ [facility] in her 35's - Spirometry 02/23/13 wnl  - 07/03/2013 Sinus CT > Mild chronic sinus disease - No acute findings. Left-to-right nasal septal deviation of 3 mm. -med calendar 08/08/13 , redone 06/01/2016 and 02/22/2017  - Eos 4%   10/13/2013  > rec singulair daily  - FENO 05/12/2016  =   24 on singulair  - Allergy profile  05/12/2016 >   IgE  47 pos  ? Depression   ? Depression with anxiety   ? Dysuria 08/03/2019  ? Environmental allergies   ? Essential hypertension 02/23/2017  ? Changed losartan to avapro 02/22/2017 due to cough   ? GERD (gastroesophageal reflux disease)   ? Hypertension   ? Hypertriglyceridemia   ? Kidney stones   ? OA (osteoarthritis)   ? Osteopenia   ? PONV (postoperative nausea and vomiting)   ? S/P lobectomy of lung 02/12/2014  ? 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 ? ?Past Surgical History:  ?Procedure Laterality Date  ? APPENDECTOMY    ? COLONOSCOPY    ? EYE SURGERY Left   ? cataract removal  ? ORIF WRIST FRACTURE Left 08/04/2013  ? Procedure: OPEN REDUCTION INTERNAL FIXATION (ORIF) LEFT DISTAL RADIUS WRIST FRACTURE;  Surgeon: Wynonia Sours, MD;  Location: [place];  Service: Orthopedics;  Laterality: Left;  ? PARTIAL HYSTERECTOMY    ? TOTAL KNEE ARTHROPLASTY Left 08/26/2018  ? Procedure: TOTAL KNEE ARTHROPLASTY;  Surgeon: Netta Cedars, MD;  [facility];  Service: Orthopedics;  Laterality: Left;  ? VIDEO ASSISTED THORACOSCOPY (VATS)/WEDGE RESECTION Right 02/12/2014  ? Procedure: RIGHT VIDEO ASSISTED THORACOSCOPY RIGHT LOWER LOBE LUNG /WEDGE RESECTION,RIGHT THORACOTOMY WITH RIGHT LOWER LOBE LOBECTOMY & NODE DISSECTION;  Surgeon: Melrose Nakayama, MD;  Location: [place];  Service: Thoracic;  Laterality: Right;  ? VIDEO ASSISTED THORACOSCOPY (VATS)/WEDGE RESECTION Right 02/12/2014  ? VIDEO BRONCHOSCOPY N/A 02/12/2014  ? Procedure: VIDEO BRONCHOSCOPY;  Surgeon: Melrose Nakayama, MD;  Location: [place];  Service: Thoracic;  Laterality: N/A;  ? ? ?Current Medications: ?Current Meds  ?Medication Sig  ? acetaminophen (TYLENOL) 325 MG tablet Take 2 tablets (650 mg total) by mouth every 4 (four) hours as needed for mild pain.  ? albuterol (VENTOLIN HFA) 108 (90 Base) MCG/ACT inhaler TAKE 2 PUFFS BY MOUTH EVERY 6 HOURS AS NEEDED FOR WHEEZE OR  SHORTNESS OF BREATH  ? amLODipine (NORVASC) 10 MG tablet Take 1 tablet (10 mg total) by mouth every morning.  ? Ascorbic Acid (VITAMIN C) 100 MG tablet Take 1 tablet (100 mg total) by mouth daily.  ? aspirin EC 81 MG tablet Take 1 tablet (81 mg total) by mouth daily. Swallow whole.  ? budesonide-formoterol (SYMBICORT) 80-4.5 MCG/ACT inhaler INHALE 2 PUFFS BY MOUTH INTO THE LUNGS DAILY  ? Calcium-Magnesium-Vitamin D (CALCIUM 1200+D3 PO) Take 1 tablet by mouth daily.   ? chlorpheniramine (CHLOR-TRIMETON) 4 MG tablet Take 4 mg by mouth at bedtime.   ? Cholecalciferol (VITAMIN D3 PO) Take 1 capsule by mouth daily.  ? clopidogrel (PLAVIX) 75 MG tablet Take 1 tablet (75 mg total) by mouth daily.  ? diclofenac Sodium (VOLTAREN) 1 % GEL Apply 2 g topically 4 (four) times daily as needed (Foot pain).  ? dorzolamide-timolol (COSOPT) 22.3-6.8 MG/ML ophthalmic solution Place 1 drop into both eyes 2 (two)  times daily.  ? econazole nitrate 1 % cream Apply 1 application. topically as needed.  ? famotidine (PEPCID) 20 MG tablet Take 20 mg by mouth at bedtime.  ? fenofibrate 160 MG tablet Take 160 mg by mouth daily.  ? FLUoxetine (PROZAC) 40 MG capsule Take 40 mg by mouth daily.   ? gabapentin (NEURONTIN) 100 MG capsule Take 1 capsule (100 mg total) by mouth 3 (three) times daily. One three times daily  ? magnesium gluconate (MAGONATE) 500 MG tablet Take 0.5 tablets (250 mg total) by mouth at bedtime.  ? montelukast (SINGULAIR) 10 MG tablet Take 1 tablet (10 mg total) by mouth at bedtime.  ? Multiple Vitamins-Minerals (CENTRUM SILVER PO) Take 1 tablet by mouth daily.  ? solifenacin (VESICARE) 10 MG tablet Take 10 mg by mouth daily.  ? traZODone (DESYREL) 50 MG tablet Take 50 mg by mouth at bedtime.  ? Vitamin D, Ergocalciferol, (DRISDOL) 1.25 MG (50000 UNIT) CAPS capsule Take 1 capsule (50,000 Units total) by mouth every 7 (seven) days.  ?  ? ?Allergies:   Grass extracts [gramineae pollens], Nsaids, and Prednisone  ? ?Social History   ? ?Socioeconomic History  ? Marital status: Widowed  ?  Spouse name: Not on file  ? Number of children: 2  ? Years of education: Not on file  ? Highest education level: Not on file  ?Occupational History  ?

## 2021-10-28 ENCOUNTER — Ambulatory Visit: Payer: Medicare Other | Admitting: Nurse Practitioner

## 2021-10-28 ENCOUNTER — Encounter: Payer: Self-pay | Admitting: Nurse Practitioner

## 2021-10-28 ENCOUNTER — Other Ambulatory Visit: Payer: Self-pay | Admitting: Nurse Practitioner

## 2021-10-28 ENCOUNTER — Encounter: Payer: Self-pay | Admitting: Radiology

## 2021-10-28 VITALS — BP 128/70 | HR 55 | Ht 62.0 in | Wt 174.0 lb

## 2021-10-28 DIAGNOSIS — G4700 Insomnia, unspecified: Secondary | ICD-10-CM | POA: Diagnosis not present

## 2021-10-28 DIAGNOSIS — I10 Essential (primary) hypertension: Secondary | ICD-10-CM

## 2021-10-28 DIAGNOSIS — I2510 Atherosclerotic heart disease of native coronary artery without angina pectoris: Secondary | ICD-10-CM

## 2021-10-28 DIAGNOSIS — R001 Bradycardia, unspecified: Secondary | ICD-10-CM | POA: Diagnosis not present

## 2021-10-28 DIAGNOSIS — I69320 Aphasia following cerebral infarction: Secondary | ICD-10-CM | POA: Diagnosis not present

## 2021-10-28 DIAGNOSIS — M1990 Unspecified osteoarthritis, unspecified site: Secondary | ICD-10-CM | POA: Diagnosis not present

## 2021-10-28 DIAGNOSIS — I129 Hypertensive chronic kidney disease with stage 1 through stage 4 chronic kidney disease, or unspecified chronic kidney disease: Secondary | ICD-10-CM | POA: Diagnosis not present

## 2021-10-28 DIAGNOSIS — Z7951 Long term (current) use of inhaled steroids: Secondary | ICD-10-CM | POA: Diagnosis not present

## 2021-10-28 DIAGNOSIS — I4891 Unspecified atrial fibrillation: Secondary | ICD-10-CM

## 2021-10-28 DIAGNOSIS — R4701 Aphasia: Secondary | ICD-10-CM

## 2021-10-28 DIAGNOSIS — E781 Pure hyperglyceridemia: Secondary | ICD-10-CM | POA: Diagnosis not present

## 2021-10-28 DIAGNOSIS — I69351 Hemiplegia and hemiparesis following cerebral infarction affecting right dominant side: Secondary | ICD-10-CM | POA: Diagnosis not present

## 2021-10-28 DIAGNOSIS — Z7982 Long term (current) use of aspirin: Secondary | ICD-10-CM | POA: Diagnosis not present

## 2021-10-28 DIAGNOSIS — E785 Hyperlipidemia, unspecified: Secondary | ICD-10-CM | POA: Diagnosis not present

## 2021-10-28 DIAGNOSIS — Z87891 Personal history of nicotine dependence: Secondary | ICD-10-CM | POA: Diagnosis not present

## 2021-10-28 DIAGNOSIS — J45909 Unspecified asthma, uncomplicated: Secondary | ICD-10-CM | POA: Diagnosis not present

## 2021-10-28 DIAGNOSIS — Z85118 Personal history of other malignant neoplasm of bronchus and lung: Secondary | ICD-10-CM | POA: Diagnosis not present

## 2021-10-28 DIAGNOSIS — K219 Gastro-esophageal reflux disease without esophagitis: Secondary | ICD-10-CM | POA: Diagnosis not present

## 2021-10-28 DIAGNOSIS — I63312 Cerebral infarction due to thrombosis of left middle cerebral artery: Secondary | ICD-10-CM | POA: Diagnosis not present

## 2021-10-28 DIAGNOSIS — H409 Unspecified glaucoma: Secondary | ICD-10-CM | POA: Diagnosis not present

## 2021-10-28 DIAGNOSIS — M8580 Other specified disorders of bone density and structure, unspecified site: Secondary | ICD-10-CM | POA: Diagnosis not present

## 2021-10-28 DIAGNOSIS — Z7902 Long term (current) use of antithrombotics/antiplatelets: Secondary | ICD-10-CM | POA: Diagnosis not present

## 2021-10-28 DIAGNOSIS — Z96652 Presence of left artificial knee joint: Secondary | ICD-10-CM | POA: Diagnosis not present

## 2021-10-28 DIAGNOSIS — N1832 Chronic kidney disease, stage 3b: Secondary | ICD-10-CM | POA: Diagnosis not present

## 2021-10-28 DIAGNOSIS — E782 Mixed hyperlipidemia: Secondary | ICD-10-CM

## 2021-10-28 DIAGNOSIS — R0789 Other chest pain: Secondary | ICD-10-CM

## 2021-10-28 DIAGNOSIS — Z8673 Personal history of transient ischemic attack (TIA), and cerebral infarction without residual deficits: Secondary | ICD-10-CM | POA: Diagnosis not present

## 2021-10-28 DIAGNOSIS — E559 Vitamin D deficiency, unspecified: Secondary | ICD-10-CM | POA: Diagnosis not present

## 2021-10-28 DIAGNOSIS — I639 Cerebral infarction, unspecified: Secondary | ICD-10-CM

## 2021-10-28 DIAGNOSIS — Z9181 History of falling: Secondary | ICD-10-CM | POA: Diagnosis not present

## 2021-10-28 NOTE — Progress Notes (Signed)
Enrolled patient for a 30 day [employer] event monitor to be mailed to patients home  ?

## 2021-10-28 NOTE — Patient Instructions (Signed)
Medication Instructions:  ? ?Your physician recommends that you continue on your current medications as directed. Please refer to the Current Medication list given to you today. ? ? ?*If you need a refill on your cardiac medications before your next appointment, please call your pharmacy* ? ? ?Testing/Procedures: ? ?[employer] Cardiac Event Monitor Instructions ?Your physician has requested you wear your cardiac event monitor for __30___ days, (1-30). ?[employer] may call or text to confirm a shipping address. The monitor will be sent to a land address via UPS. ?[employer] will not ship a monitor to a PO BOX. It typically takes 3-5 days to receive your monitor after it has ?been enrolled. [employer] will assist with USPS tracking if your package is delayed. The telephone number for ?[employer] is [phone]. ?Once you have received your monitor, please review the enclosed instructions. Instruction tutorials can also be ?viewed under help and settings on the enclosed cell phone. Your monitor has already been registered assigning ?a specific monitor serial # to you. ? ?Applying the monitor ?Remove cell phone from case and turn it on. The cell phone works as Dealer and needs to be within ?10 feet of you at all times. The cell phone will need to be charged on a daily basis. We recommend you plug the ?cell phone into the enclosed charger at your bedside table every night. ? ?Monitor batteries: You will receive two monitor batteries labelled #1 and #2. These are your recorders. Plug ?battery #2 onto the second connection on the enclosed charger. Keep one battery on the charger at all times. ?This will keep the monitor battery deactivated. It will also keep it fully charged for when you need to switch ?your monitor batteries. A small light will be blinking on the battery emblem when it is charging. The light on the ?battery emblem will remain on when the battery is fully charged. ? ?Open package of a Monitor  strip. Insert battery #1 into black hood on strip and gently squeeze monitor battery ?onto connection as indicated in instruction booklet. Set aside while preparing skin. ? ?Choose location for your strip, vertical or horizontal, as indicated in the instruction booklet. Shave to remove all ?hair from location. There cannot be any lotions, oils, powders, or colognes on skin where monitor is to be ?applied. Wipe skin clean with enclosed Saline wipe. Dry skin completely. ? ?Peel paper labeled #1 off the back of the Monitor strip exposing the adhesive. Place the monitor on the chest in ?the vertical or horizontal position shown in the instruction booklet. One arrow on the monitor strip must be ?pointing upward. Carefully remove paper labeled #2, attaching remainder of strip to your skin. Try not to ?create any folds or wrinkles in the strip as you apply it. ? ?Firmly press and release the circle in the center of the monitor battery. You will hear a small beep. This is ?turning the monitor battery on. The heart emblem on the monitor battery will light up every 5 seconds if the ?monitor battery in turned on and connected to the patient securely. Do not push and hold the circle down as ?this turns the monitor battery off. The cell phone will locate the monitor battery. A screen will appear on the ?cell phone checking the connection of your monitor strip. This may read poor connection initially but change to ?good connection within the next minute. Once your monitor accepts the connection you will hear a series of 3 ?beeps followed by a climbing crescendo of beeps.  A screen will appear on the cell phone showing the two ?monitor strip placement options. Touch the picture that demonstrates where you applied the monitor strip. ? ?Your monitor strip and battery are waterproof. You are able to shower, bathe, or swim with the monitor on. ?They just ask you do not submerge deeper than 3 feet underwater. We recommend removing the  monitor if ?you are swimming in a lake, river, or ocean. ? ?Your monitor battery will need to be switched to a fully charged monitor battery approximately once a week. ?The cell phone will alert you of an action which needs to be made. ? ?On the cell phone, tap for details to reveal connection status, monitor battery status, and cell phone battery ?status. The green dots indicates your monitor is in good status. A red dot indicates there is something that ?needs your attention. ? ?To record a symptom, click the circle on the monitor battery. In 30-60 seconds a list of symptoms will appear on ?the cell phone. Select your symptom and tap save. Your monitor will record a sustained or significant ?arrhythmia regardless of you clicking the button. Some patients do not feel the heart rhythm irregularities. ?[employer] will notify us of any serious or critical events. ? ?Refer to instruction booklet for instructions on switching batteries, changing strips, the Do not disturb or Pause ?features, or any additional questions. ? ?Call [employer] at [phone], to confirm your monitor is transmitting and record your baseline. They will ?answer any questions you may have regarding the monitor instructions at that time. ? ?Returning the monitor to [employer] ?Place all equipment back into blue box. Peel off strip of paper to expose adhesive and close box securely. There ?is a prepaid UPS shipping label on this box. Drop in a UPS drop box, or at a UPS facility like [employer]. You may ?also contact [employer] to arrange UPS to pick up monitor package at your home. ? ? ? ?Follow-Up: ?At [facility], you and your health needs are our priority.  As part of our continuing mission to provide you with exceptional heart care, we have created designated Provider Care Teams.  These Care Teams include your primary Cardiologist (physician) and Advanced Practice Providers (APPs -  Physician Assistants and Nurse Practitioners) who all work  together to provide you with the care you need, when you need it. ? ?We recommend signing up for the patient portal called "MyChart".  Sign up information is provided on this After Visit Summary.  MyChart is used to connect with patients for Virtual Visits (Telemedicine).  Patients are able to view lab/test results, encounter notes, upcoming appointments, etc.  Non-urgent messages can be sent to your provider as well.   ?To learn more about what you can do with MyChart, go to [email or web address].   ? ?Your next appointment:   ?3 month(s) ? ?The format for your next appointment:   ?In Person ? ?Provider:   ?Larae Grooms, MD   ? ? ?Important Information About [employer] ? ? ? ? ?  ?

## 2021-10-29 ENCOUNTER — Encounter: Payer: Self-pay | Admitting: Registered Nurse

## 2021-10-30 ENCOUNTER — Ambulatory Visit: Payer: Medicare Other | Admitting: Physician Assistant

## 2021-10-30 DIAGNOSIS — Z96652 Presence of left artificial knee joint: Secondary | ICD-10-CM | POA: Diagnosis not present

## 2021-10-30 DIAGNOSIS — G4700 Insomnia, unspecified: Secondary | ICD-10-CM | POA: Diagnosis not present

## 2021-10-30 DIAGNOSIS — Z9181 History of falling: Secondary | ICD-10-CM | POA: Diagnosis not present

## 2021-10-30 DIAGNOSIS — I63312 Cerebral infarction due to thrombosis of left middle cerebral artery: Secondary | ICD-10-CM | POA: Diagnosis not present

## 2021-10-30 DIAGNOSIS — I129 Hypertensive chronic kidney disease with stage 1 through stage 4 chronic kidney disease, or unspecified chronic kidney disease: Secondary | ICD-10-CM | POA: Diagnosis not present

## 2021-10-30 DIAGNOSIS — I69351 Hemiplegia and hemiparesis following cerebral infarction affecting right dominant side: Secondary | ICD-10-CM | POA: Diagnosis not present

## 2021-10-30 DIAGNOSIS — N1832 Chronic kidney disease, stage 3b: Secondary | ICD-10-CM | POA: Diagnosis not present

## 2021-10-30 DIAGNOSIS — Z7951 Long term (current) use of inhaled steroids: Secondary | ICD-10-CM | POA: Diagnosis not present

## 2021-10-30 DIAGNOSIS — E781 Pure hyperglyceridemia: Secondary | ICD-10-CM | POA: Diagnosis not present

## 2021-10-30 DIAGNOSIS — M8580 Other specified disorders of bone density and structure, unspecified site: Secondary | ICD-10-CM | POA: Diagnosis not present

## 2021-10-30 DIAGNOSIS — Z7902 Long term (current) use of antithrombotics/antiplatelets: Secondary | ICD-10-CM | POA: Diagnosis not present

## 2021-10-30 DIAGNOSIS — J45909 Unspecified asthma, uncomplicated: Secondary | ICD-10-CM | POA: Diagnosis not present

## 2021-10-30 DIAGNOSIS — Z7982 Long term (current) use of aspirin: Secondary | ICD-10-CM | POA: Diagnosis not present

## 2021-10-30 DIAGNOSIS — I69320 Aphasia following cerebral infarction: Secondary | ICD-10-CM | POA: Diagnosis not present

## 2021-10-30 DIAGNOSIS — M1990 Unspecified osteoarthritis, unspecified site: Secondary | ICD-10-CM | POA: Diagnosis not present

## 2021-10-30 DIAGNOSIS — H409 Unspecified glaucoma: Secondary | ICD-10-CM | POA: Diagnosis not present

## 2021-10-30 DIAGNOSIS — Z87891 Personal history of nicotine dependence: Secondary | ICD-10-CM | POA: Diagnosis not present

## 2021-10-30 DIAGNOSIS — Z85118 Personal history of other malignant neoplasm of bronchus and lung: Secondary | ICD-10-CM | POA: Diagnosis not present

## 2021-10-30 DIAGNOSIS — E559 Vitamin D deficiency, unspecified: Secondary | ICD-10-CM | POA: Diagnosis not present

## 2021-10-30 DIAGNOSIS — K219 Gastro-esophageal reflux disease without esophagitis: Secondary | ICD-10-CM | POA: Diagnosis not present

## 2021-10-31 DIAGNOSIS — I63312 Cerebral infarction due to thrombosis of left middle cerebral artery: Secondary | ICD-10-CM | POA: Diagnosis not present

## 2021-10-31 DIAGNOSIS — Z85118 Personal history of other malignant neoplasm of bronchus and lung: Secondary | ICD-10-CM | POA: Diagnosis not present

## 2021-10-31 DIAGNOSIS — Z7902 Long term (current) use of antithrombotics/antiplatelets: Secondary | ICD-10-CM | POA: Diagnosis not present

## 2021-10-31 DIAGNOSIS — Z7982 Long term (current) use of aspirin: Secondary | ICD-10-CM | POA: Diagnosis not present

## 2021-10-31 DIAGNOSIS — N1832 Chronic kidney disease, stage 3b: Secondary | ICD-10-CM | POA: Diagnosis not present

## 2021-10-31 DIAGNOSIS — M8580 Other specified disorders of bone density and structure, unspecified site: Secondary | ICD-10-CM | POA: Diagnosis not present

## 2021-10-31 DIAGNOSIS — Z7951 Long term (current) use of inhaled steroids: Secondary | ICD-10-CM | POA: Diagnosis not present

## 2021-10-31 DIAGNOSIS — I69351 Hemiplegia and hemiparesis following cerebral infarction affecting right dominant side: Secondary | ICD-10-CM | POA: Diagnosis not present

## 2021-10-31 DIAGNOSIS — Z9181 History of falling: Secondary | ICD-10-CM | POA: Diagnosis not present

## 2021-10-31 DIAGNOSIS — E781 Pure hyperglyceridemia: Secondary | ICD-10-CM | POA: Diagnosis not present

## 2021-10-31 DIAGNOSIS — E559 Vitamin D deficiency, unspecified: Secondary | ICD-10-CM | POA: Diagnosis not present

## 2021-10-31 DIAGNOSIS — H409 Unspecified glaucoma: Secondary | ICD-10-CM | POA: Diagnosis not present

## 2021-10-31 DIAGNOSIS — J45909 Unspecified asthma, uncomplicated: Secondary | ICD-10-CM | POA: Diagnosis not present

## 2021-10-31 DIAGNOSIS — M1990 Unspecified osteoarthritis, unspecified site: Secondary | ICD-10-CM | POA: Diagnosis not present

## 2021-10-31 DIAGNOSIS — G4700 Insomnia, unspecified: Secondary | ICD-10-CM | POA: Diagnosis not present

## 2021-10-31 DIAGNOSIS — I129 Hypertensive chronic kidney disease with stage 1 through stage 4 chronic kidney disease, or unspecified chronic kidney disease: Secondary | ICD-10-CM | POA: Diagnosis not present

## 2021-10-31 DIAGNOSIS — I69320 Aphasia following cerebral infarction: Secondary | ICD-10-CM | POA: Diagnosis not present

## 2021-10-31 DIAGNOSIS — K219 Gastro-esophageal reflux disease without esophagitis: Secondary | ICD-10-CM | POA: Diagnosis not present

## 2021-10-31 DIAGNOSIS — Z87891 Personal history of nicotine dependence: Secondary | ICD-10-CM | POA: Diagnosis not present

## 2021-10-31 DIAGNOSIS — Z96652 Presence of left artificial knee joint: Secondary | ICD-10-CM | POA: Diagnosis not present

## 2021-11-01 ENCOUNTER — Other Ambulatory Visit: Payer: Self-pay | Admitting: Physical Medicine and Rehabilitation

## 2021-11-03 DIAGNOSIS — M8580 Other specified disorders of bone density and structure, unspecified site: Secondary | ICD-10-CM | POA: Diagnosis not present

## 2021-11-03 DIAGNOSIS — Z7902 Long term (current) use of antithrombotics/antiplatelets: Secondary | ICD-10-CM | POA: Diagnosis not present

## 2021-11-03 DIAGNOSIS — I129 Hypertensive chronic kidney disease with stage 1 through stage 4 chronic kidney disease, or unspecified chronic kidney disease: Secondary | ICD-10-CM | POA: Diagnosis not present

## 2021-11-03 DIAGNOSIS — Z85118 Personal history of other malignant neoplasm of bronchus and lung: Secondary | ICD-10-CM | POA: Diagnosis not present

## 2021-11-03 DIAGNOSIS — I69320 Aphasia following cerebral infarction: Secondary | ICD-10-CM | POA: Diagnosis not present

## 2021-11-03 DIAGNOSIS — H409 Unspecified glaucoma: Secondary | ICD-10-CM | POA: Diagnosis not present

## 2021-11-03 DIAGNOSIS — E781 Pure hyperglyceridemia: Secondary | ICD-10-CM | POA: Diagnosis not present

## 2021-11-03 DIAGNOSIS — J45909 Unspecified asthma, uncomplicated: Secondary | ICD-10-CM | POA: Diagnosis not present

## 2021-11-03 DIAGNOSIS — Z9181 History of falling: Secondary | ICD-10-CM | POA: Diagnosis not present

## 2021-11-03 DIAGNOSIS — E559 Vitamin D deficiency, unspecified: Secondary | ICD-10-CM | POA: Diagnosis not present

## 2021-11-03 DIAGNOSIS — G4700 Insomnia, unspecified: Secondary | ICD-10-CM | POA: Diagnosis not present

## 2021-11-03 DIAGNOSIS — Z7982 Long term (current) use of aspirin: Secondary | ICD-10-CM | POA: Diagnosis not present

## 2021-11-03 DIAGNOSIS — Z87891 Personal history of nicotine dependence: Secondary | ICD-10-CM | POA: Diagnosis not present

## 2021-11-03 DIAGNOSIS — N1832 Chronic kidney disease, stage 3b: Secondary | ICD-10-CM | POA: Diagnosis not present

## 2021-11-03 DIAGNOSIS — I69351 Hemiplegia and hemiparesis following cerebral infarction affecting right dominant side: Secondary | ICD-10-CM | POA: Diagnosis not present

## 2021-11-03 DIAGNOSIS — K219 Gastro-esophageal reflux disease without esophagitis: Secondary | ICD-10-CM | POA: Diagnosis not present

## 2021-11-03 DIAGNOSIS — Z96652 Presence of left artificial knee joint: Secondary | ICD-10-CM | POA: Diagnosis not present

## 2021-11-03 DIAGNOSIS — I63312 Cerebral infarction due to thrombosis of left middle cerebral artery: Secondary | ICD-10-CM | POA: Diagnosis not present

## 2021-11-03 DIAGNOSIS — M1990 Unspecified osteoarthritis, unspecified site: Secondary | ICD-10-CM | POA: Diagnosis not present

## 2021-11-03 DIAGNOSIS — Z7951 Long term (current) use of inhaled steroids: Secondary | ICD-10-CM | POA: Diagnosis not present

## 2021-11-04 DIAGNOSIS — M8580 Other specified disorders of bone density and structure, unspecified site: Secondary | ICD-10-CM | POA: Diagnosis not present

## 2021-11-04 DIAGNOSIS — I63312 Cerebral infarction due to thrombosis of left middle cerebral artery: Secondary | ICD-10-CM | POA: Diagnosis not present

## 2021-11-04 DIAGNOSIS — I129 Hypertensive chronic kidney disease with stage 1 through stage 4 chronic kidney disease, or unspecified chronic kidney disease: Secondary | ICD-10-CM | POA: Diagnosis not present

## 2021-11-04 DIAGNOSIS — Z7982 Long term (current) use of aspirin: Secondary | ICD-10-CM | POA: Diagnosis not present

## 2021-11-04 DIAGNOSIS — H409 Unspecified glaucoma: Secondary | ICD-10-CM | POA: Diagnosis not present

## 2021-11-04 DIAGNOSIS — K219 Gastro-esophageal reflux disease without esophagitis: Secondary | ICD-10-CM | POA: Diagnosis not present

## 2021-11-04 DIAGNOSIS — G4700 Insomnia, unspecified: Secondary | ICD-10-CM | POA: Diagnosis not present

## 2021-11-04 DIAGNOSIS — I69351 Hemiplegia and hemiparesis following cerebral infarction affecting right dominant side: Secondary | ICD-10-CM | POA: Diagnosis not present

## 2021-11-04 DIAGNOSIS — Z85118 Personal history of other malignant neoplasm of bronchus and lung: Secondary | ICD-10-CM | POA: Diagnosis not present

## 2021-11-04 DIAGNOSIS — J45909 Unspecified asthma, uncomplicated: Secondary | ICD-10-CM | POA: Diagnosis not present

## 2021-11-04 DIAGNOSIS — M1990 Unspecified osteoarthritis, unspecified site: Secondary | ICD-10-CM | POA: Diagnosis not present

## 2021-11-04 DIAGNOSIS — Z9181 History of falling: Secondary | ICD-10-CM | POA: Diagnosis not present

## 2021-11-04 DIAGNOSIS — E559 Vitamin D deficiency, unspecified: Secondary | ICD-10-CM | POA: Diagnosis not present

## 2021-11-04 DIAGNOSIS — E781 Pure hyperglyceridemia: Secondary | ICD-10-CM | POA: Diagnosis not present

## 2021-11-04 DIAGNOSIS — Z7902 Long term (current) use of antithrombotics/antiplatelets: Secondary | ICD-10-CM | POA: Diagnosis not present

## 2021-11-04 DIAGNOSIS — Z87891 Personal history of nicotine dependence: Secondary | ICD-10-CM | POA: Diagnosis not present

## 2021-11-04 DIAGNOSIS — Z96652 Presence of left artificial knee joint: Secondary | ICD-10-CM | POA: Diagnosis not present

## 2021-11-04 DIAGNOSIS — N1832 Chronic kidney disease, stage 3b: Secondary | ICD-10-CM | POA: Diagnosis not present

## 2021-11-04 DIAGNOSIS — Z7951 Long term (current) use of inhaled steroids: Secondary | ICD-10-CM | POA: Diagnosis not present

## 2021-11-04 DIAGNOSIS — I69320 Aphasia following cerebral infarction: Secondary | ICD-10-CM | POA: Diagnosis not present

## 2021-11-06 ENCOUNTER — Encounter (INDEPENDENT_AMBULATORY_CARE_PROVIDER_SITE_OTHER): Payer: Medicare Other

## 2021-11-06 DIAGNOSIS — E782 Mixed hyperlipidemia: Secondary | ICD-10-CM

## 2021-11-06 DIAGNOSIS — Z96652 Presence of left artificial knee joint: Secondary | ICD-10-CM | POA: Diagnosis not present

## 2021-11-06 DIAGNOSIS — I639 Cerebral infarction, unspecified: Secondary | ICD-10-CM

## 2021-11-06 DIAGNOSIS — Z87891 Personal history of nicotine dependence: Secondary | ICD-10-CM | POA: Diagnosis not present

## 2021-11-06 DIAGNOSIS — E559 Vitamin D deficiency, unspecified: Secondary | ICD-10-CM | POA: Diagnosis not present

## 2021-11-06 DIAGNOSIS — Z7951 Long term (current) use of inhaled steroids: Secondary | ICD-10-CM | POA: Diagnosis not present

## 2021-11-06 DIAGNOSIS — K219 Gastro-esophageal reflux disease without esophagitis: Secondary | ICD-10-CM | POA: Diagnosis not present

## 2021-11-06 DIAGNOSIS — I129 Hypertensive chronic kidney disease with stage 1 through stage 4 chronic kidney disease, or unspecified chronic kidney disease: Secondary | ICD-10-CM | POA: Diagnosis not present

## 2021-11-06 DIAGNOSIS — I69320 Aphasia following cerebral infarction: Secondary | ICD-10-CM | POA: Diagnosis not present

## 2021-11-06 DIAGNOSIS — R0789 Other chest pain: Secondary | ICD-10-CM

## 2021-11-06 DIAGNOSIS — M1990 Unspecified osteoarthritis, unspecified site: Secondary | ICD-10-CM | POA: Diagnosis not present

## 2021-11-06 DIAGNOSIS — I4891 Unspecified atrial fibrillation: Secondary | ICD-10-CM | POA: Diagnosis not present

## 2021-11-06 DIAGNOSIS — I2510 Atherosclerotic heart disease of native coronary artery without angina pectoris: Secondary | ICD-10-CM

## 2021-11-06 DIAGNOSIS — Z7982 Long term (current) use of aspirin: Secondary | ICD-10-CM | POA: Diagnosis not present

## 2021-11-06 DIAGNOSIS — N1832 Chronic kidney disease, stage 3b: Secondary | ICD-10-CM | POA: Diagnosis not present

## 2021-11-06 DIAGNOSIS — H409 Unspecified glaucoma: Secondary | ICD-10-CM | POA: Diagnosis not present

## 2021-11-06 DIAGNOSIS — E781 Pure hyperglyceridemia: Secondary | ICD-10-CM | POA: Diagnosis not present

## 2021-11-06 DIAGNOSIS — I63312 Cerebral infarction due to thrombosis of left middle cerebral artery: Secondary | ICD-10-CM | POA: Diagnosis not present

## 2021-11-06 DIAGNOSIS — I10 Essential (primary) hypertension: Secondary | ICD-10-CM

## 2021-11-06 DIAGNOSIS — I69351 Hemiplegia and hemiparesis following cerebral infarction affecting right dominant side: Secondary | ICD-10-CM | POA: Diagnosis not present

## 2021-11-06 DIAGNOSIS — G4700 Insomnia, unspecified: Secondary | ICD-10-CM | POA: Diagnosis not present

## 2021-11-06 DIAGNOSIS — Z85118 Personal history of other malignant neoplasm of bronchus and lung: Secondary | ICD-10-CM | POA: Diagnosis not present

## 2021-11-06 DIAGNOSIS — Z7902 Long term (current) use of antithrombotics/antiplatelets: Secondary | ICD-10-CM | POA: Diagnosis not present

## 2021-11-06 DIAGNOSIS — J45909 Unspecified asthma, uncomplicated: Secondary | ICD-10-CM | POA: Diagnosis not present

## 2021-11-06 DIAGNOSIS — M8580 Other specified disorders of bone density and structure, unspecified site: Secondary | ICD-10-CM | POA: Diagnosis not present

## 2021-11-06 DIAGNOSIS — Z9181 History of falling: Secondary | ICD-10-CM | POA: Diagnosis not present

## 2021-11-07 DIAGNOSIS — M8580 Other specified disorders of bone density and structure, unspecified site: Secondary | ICD-10-CM | POA: Diagnosis not present

## 2021-11-07 DIAGNOSIS — Z96652 Presence of left artificial knee joint: Secondary | ICD-10-CM | POA: Diagnosis not present

## 2021-11-07 DIAGNOSIS — H409 Unspecified glaucoma: Secondary | ICD-10-CM | POA: Diagnosis not present

## 2021-11-07 DIAGNOSIS — N1832 Chronic kidney disease, stage 3b: Secondary | ICD-10-CM | POA: Diagnosis not present

## 2021-11-07 DIAGNOSIS — E781 Pure hyperglyceridemia: Secondary | ICD-10-CM | POA: Diagnosis not present

## 2021-11-07 DIAGNOSIS — E559 Vitamin D deficiency, unspecified: Secondary | ICD-10-CM | POA: Diagnosis not present

## 2021-11-07 DIAGNOSIS — I63312 Cerebral infarction due to thrombosis of left middle cerebral artery: Secondary | ICD-10-CM | POA: Diagnosis not present

## 2021-11-07 DIAGNOSIS — I69351 Hemiplegia and hemiparesis following cerebral infarction affecting right dominant side: Secondary | ICD-10-CM | POA: Diagnosis not present

## 2021-11-07 DIAGNOSIS — I69320 Aphasia following cerebral infarction: Secondary | ICD-10-CM | POA: Diagnosis not present

## 2021-11-07 DIAGNOSIS — Z7951 Long term (current) use of inhaled steroids: Secondary | ICD-10-CM | POA: Diagnosis not present

## 2021-11-07 DIAGNOSIS — Z7982 Long term (current) use of aspirin: Secondary | ICD-10-CM | POA: Diagnosis not present

## 2021-11-07 DIAGNOSIS — G4700 Insomnia, unspecified: Secondary | ICD-10-CM | POA: Diagnosis not present

## 2021-11-07 DIAGNOSIS — Z7902 Long term (current) use of antithrombotics/antiplatelets: Secondary | ICD-10-CM | POA: Diagnosis not present

## 2021-11-07 DIAGNOSIS — J45909 Unspecified asthma, uncomplicated: Secondary | ICD-10-CM | POA: Diagnosis not present

## 2021-11-07 DIAGNOSIS — Z85118 Personal history of other malignant neoplasm of bronchus and lung: Secondary | ICD-10-CM | POA: Diagnosis not present

## 2021-11-07 DIAGNOSIS — Z9181 History of falling: Secondary | ICD-10-CM | POA: Diagnosis not present

## 2021-11-07 DIAGNOSIS — I129 Hypertensive chronic kidney disease with stage 1 through stage 4 chronic kidney disease, or unspecified chronic kidney disease: Secondary | ICD-10-CM | POA: Diagnosis not present

## 2021-11-07 DIAGNOSIS — Z87891 Personal history of nicotine dependence: Secondary | ICD-10-CM | POA: Diagnosis not present

## 2021-11-07 DIAGNOSIS — K219 Gastro-esophageal reflux disease without esophagitis: Secondary | ICD-10-CM | POA: Diagnosis not present

## 2021-11-07 DIAGNOSIS — M1990 Unspecified osteoarthritis, unspecified site: Secondary | ICD-10-CM | POA: Diagnosis not present

## 2021-11-10 DIAGNOSIS — Z9181 History of falling: Secondary | ICD-10-CM | POA: Diagnosis not present

## 2021-11-10 DIAGNOSIS — I129 Hypertensive chronic kidney disease with stage 1 through stage 4 chronic kidney disease, or unspecified chronic kidney disease: Secondary | ICD-10-CM | POA: Diagnosis not present

## 2021-11-10 DIAGNOSIS — I63312 Cerebral infarction due to thrombosis of left middle cerebral artery: Secondary | ICD-10-CM | POA: Diagnosis not present

## 2021-11-10 DIAGNOSIS — E781 Pure hyperglyceridemia: Secondary | ICD-10-CM | POA: Diagnosis not present

## 2021-11-10 DIAGNOSIS — Z96652 Presence of left artificial knee joint: Secondary | ICD-10-CM | POA: Diagnosis not present

## 2021-11-10 DIAGNOSIS — I69351 Hemiplegia and hemiparesis following cerebral infarction affecting right dominant side: Secondary | ICD-10-CM | POA: Diagnosis not present

## 2021-11-10 DIAGNOSIS — Z85118 Personal history of other malignant neoplasm of bronchus and lung: Secondary | ICD-10-CM | POA: Diagnosis not present

## 2021-11-10 DIAGNOSIS — Z87891 Personal history of nicotine dependence: Secondary | ICD-10-CM | POA: Diagnosis not present

## 2021-11-10 DIAGNOSIS — Z7951 Long term (current) use of inhaled steroids: Secondary | ICD-10-CM | POA: Diagnosis not present

## 2021-11-10 DIAGNOSIS — Z7902 Long term (current) use of antithrombotics/antiplatelets: Secondary | ICD-10-CM | POA: Diagnosis not present

## 2021-11-10 DIAGNOSIS — N1832 Chronic kidney disease, stage 3b: Secondary | ICD-10-CM | POA: Diagnosis not present

## 2021-11-10 DIAGNOSIS — J45909 Unspecified asthma, uncomplicated: Secondary | ICD-10-CM | POA: Diagnosis not present

## 2021-11-10 DIAGNOSIS — I10 Essential (primary) hypertension: Secondary | ICD-10-CM | POA: Diagnosis not present

## 2021-11-10 DIAGNOSIS — E559 Vitamin D deficiency, unspecified: Secondary | ICD-10-CM | POA: Diagnosis not present

## 2021-11-10 DIAGNOSIS — H409 Unspecified glaucoma: Secondary | ICD-10-CM | POA: Diagnosis not present

## 2021-11-10 DIAGNOSIS — G4700 Insomnia, unspecified: Secondary | ICD-10-CM | POA: Diagnosis not present

## 2021-11-10 DIAGNOSIS — K219 Gastro-esophageal reflux disease without esophagitis: Secondary | ICD-10-CM | POA: Diagnosis not present

## 2021-11-10 DIAGNOSIS — I69320 Aphasia following cerebral infarction: Secondary | ICD-10-CM | POA: Diagnosis not present

## 2021-11-10 DIAGNOSIS — E7800 Pure hypercholesterolemia, unspecified: Secondary | ICD-10-CM | POA: Diagnosis not present

## 2021-11-10 DIAGNOSIS — Z7982 Long term (current) use of aspirin: Secondary | ICD-10-CM | POA: Diagnosis not present

## 2021-11-10 DIAGNOSIS — J4530 Mild persistent asthma, uncomplicated: Secondary | ICD-10-CM | POA: Diagnosis not present

## 2021-11-10 DIAGNOSIS — M8580 Other specified disorders of bone density and structure, unspecified site: Secondary | ICD-10-CM | POA: Diagnosis not present

## 2021-11-10 DIAGNOSIS — M1990 Unspecified osteoarthritis, unspecified site: Secondary | ICD-10-CM | POA: Diagnosis not present

## 2021-11-11 DIAGNOSIS — M8580 Other specified disorders of bone density and structure, unspecified site: Secondary | ICD-10-CM | POA: Diagnosis not present

## 2021-11-11 DIAGNOSIS — I63312 Cerebral infarction due to thrombosis of left middle cerebral artery: Secondary | ICD-10-CM | POA: Diagnosis not present

## 2021-11-11 DIAGNOSIS — H409 Unspecified glaucoma: Secondary | ICD-10-CM | POA: Diagnosis not present

## 2021-11-11 DIAGNOSIS — I69320 Aphasia following cerebral infarction: Secondary | ICD-10-CM | POA: Diagnosis not present

## 2021-11-11 DIAGNOSIS — Z87891 Personal history of nicotine dependence: Secondary | ICD-10-CM | POA: Diagnosis not present

## 2021-11-11 DIAGNOSIS — J45909 Unspecified asthma, uncomplicated: Secondary | ICD-10-CM | POA: Diagnosis not present

## 2021-11-11 DIAGNOSIS — Z96652 Presence of left artificial knee joint: Secondary | ICD-10-CM | POA: Diagnosis not present

## 2021-11-11 DIAGNOSIS — N1832 Chronic kidney disease, stage 3b: Secondary | ICD-10-CM | POA: Diagnosis not present

## 2021-11-11 DIAGNOSIS — E559 Vitamin D deficiency, unspecified: Secondary | ICD-10-CM | POA: Diagnosis not present

## 2021-11-11 DIAGNOSIS — Z9181 History of falling: Secondary | ICD-10-CM | POA: Diagnosis not present

## 2021-11-11 DIAGNOSIS — E781 Pure hyperglyceridemia: Secondary | ICD-10-CM | POA: Diagnosis not present

## 2021-11-11 DIAGNOSIS — Z7951 Long term (current) use of inhaled steroids: Secondary | ICD-10-CM | POA: Diagnosis not present

## 2021-11-11 DIAGNOSIS — Z85118 Personal history of other malignant neoplasm of bronchus and lung: Secondary | ICD-10-CM | POA: Diagnosis not present

## 2021-11-11 DIAGNOSIS — M1990 Unspecified osteoarthritis, unspecified site: Secondary | ICD-10-CM | POA: Diagnosis not present

## 2021-11-11 DIAGNOSIS — Z7982 Long term (current) use of aspirin: Secondary | ICD-10-CM | POA: Diagnosis not present

## 2021-11-11 DIAGNOSIS — Z7902 Long term (current) use of antithrombotics/antiplatelets: Secondary | ICD-10-CM | POA: Diagnosis not present

## 2021-11-11 DIAGNOSIS — I129 Hypertensive chronic kidney disease with stage 1 through stage 4 chronic kidney disease, or unspecified chronic kidney disease: Secondary | ICD-10-CM | POA: Diagnosis not present

## 2021-11-11 DIAGNOSIS — K219 Gastro-esophageal reflux disease without esophagitis: Secondary | ICD-10-CM | POA: Diagnosis not present

## 2021-11-11 DIAGNOSIS — I69351 Hemiplegia and hemiparesis following cerebral infarction affecting right dominant side: Secondary | ICD-10-CM | POA: Diagnosis not present

## 2021-11-11 DIAGNOSIS — G4700 Insomnia, unspecified: Secondary | ICD-10-CM | POA: Diagnosis not present

## 2021-11-17 DIAGNOSIS — Z85118 Personal history of other malignant neoplasm of bronchus and lung: Secondary | ICD-10-CM | POA: Diagnosis not present

## 2021-11-17 DIAGNOSIS — M1990 Unspecified osteoarthritis, unspecified site: Secondary | ICD-10-CM | POA: Diagnosis not present

## 2021-11-17 DIAGNOSIS — E559 Vitamin D deficiency, unspecified: Secondary | ICD-10-CM | POA: Diagnosis not present

## 2021-11-17 DIAGNOSIS — Z9181 History of falling: Secondary | ICD-10-CM | POA: Diagnosis not present

## 2021-11-17 DIAGNOSIS — Z7982 Long term (current) use of aspirin: Secondary | ICD-10-CM | POA: Diagnosis not present

## 2021-11-17 DIAGNOSIS — I69351 Hemiplegia and hemiparesis following cerebral infarction affecting right dominant side: Secondary | ICD-10-CM | POA: Diagnosis not present

## 2021-11-17 DIAGNOSIS — Z7902 Long term (current) use of antithrombotics/antiplatelets: Secondary | ICD-10-CM | POA: Diagnosis not present

## 2021-11-17 DIAGNOSIS — Z87891 Personal history of nicotine dependence: Secondary | ICD-10-CM | POA: Diagnosis not present

## 2021-11-17 DIAGNOSIS — J45909 Unspecified asthma, uncomplicated: Secondary | ICD-10-CM | POA: Diagnosis not present

## 2021-11-17 DIAGNOSIS — N1832 Chronic kidney disease, stage 3b: Secondary | ICD-10-CM | POA: Diagnosis not present

## 2021-11-17 DIAGNOSIS — M8580 Other specified disorders of bone density and structure, unspecified site: Secondary | ICD-10-CM | POA: Diagnosis not present

## 2021-11-17 DIAGNOSIS — Z7951 Long term (current) use of inhaled steroids: Secondary | ICD-10-CM | POA: Diagnosis not present

## 2021-11-17 DIAGNOSIS — H409 Unspecified glaucoma: Secondary | ICD-10-CM | POA: Diagnosis not present

## 2021-11-17 DIAGNOSIS — I129 Hypertensive chronic kidney disease with stage 1 through stage 4 chronic kidney disease, or unspecified chronic kidney disease: Secondary | ICD-10-CM | POA: Diagnosis not present

## 2021-11-17 DIAGNOSIS — K219 Gastro-esophageal reflux disease without esophagitis: Secondary | ICD-10-CM | POA: Diagnosis not present

## 2021-11-17 DIAGNOSIS — I63312 Cerebral infarction due to thrombosis of left middle cerebral artery: Secondary | ICD-10-CM | POA: Diagnosis not present

## 2021-11-17 DIAGNOSIS — G4700 Insomnia, unspecified: Secondary | ICD-10-CM | POA: Diagnosis not present

## 2021-11-17 DIAGNOSIS — Z96652 Presence of left artificial knee joint: Secondary | ICD-10-CM | POA: Diagnosis not present

## 2021-11-17 DIAGNOSIS — E781 Pure hyperglyceridemia: Secondary | ICD-10-CM | POA: Diagnosis not present

## 2021-11-17 DIAGNOSIS — I69320 Aphasia following cerebral infarction: Secondary | ICD-10-CM | POA: Diagnosis not present

## 2021-11-18 DIAGNOSIS — Z87891 Personal history of nicotine dependence: Secondary | ICD-10-CM | POA: Diagnosis not present

## 2021-11-18 DIAGNOSIS — K219 Gastro-esophageal reflux disease without esophagitis: Secondary | ICD-10-CM | POA: Diagnosis not present

## 2021-11-18 DIAGNOSIS — I69351 Hemiplegia and hemiparesis following cerebral infarction affecting right dominant side: Secondary | ICD-10-CM | POA: Diagnosis not present

## 2021-11-18 DIAGNOSIS — Z9181 History of falling: Secondary | ICD-10-CM | POA: Diagnosis not present

## 2021-11-18 DIAGNOSIS — Z7902 Long term (current) use of antithrombotics/antiplatelets: Secondary | ICD-10-CM | POA: Diagnosis not present

## 2021-11-18 DIAGNOSIS — Z7951 Long term (current) use of inhaled steroids: Secondary | ICD-10-CM | POA: Diagnosis not present

## 2021-11-18 DIAGNOSIS — M8580 Other specified disorders of bone density and structure, unspecified site: Secondary | ICD-10-CM | POA: Diagnosis not present

## 2021-11-18 DIAGNOSIS — Z7982 Long term (current) use of aspirin: Secondary | ICD-10-CM | POA: Diagnosis not present

## 2021-11-18 DIAGNOSIS — N1832 Chronic kidney disease, stage 3b: Secondary | ICD-10-CM | POA: Diagnosis not present

## 2021-11-18 DIAGNOSIS — Z96652 Presence of left artificial knee joint: Secondary | ICD-10-CM | POA: Diagnosis not present

## 2021-11-18 DIAGNOSIS — M1990 Unspecified osteoarthritis, unspecified site: Secondary | ICD-10-CM | POA: Diagnosis not present

## 2021-11-18 DIAGNOSIS — H409 Unspecified glaucoma: Secondary | ICD-10-CM | POA: Diagnosis not present

## 2021-11-18 DIAGNOSIS — E559 Vitamin D deficiency, unspecified: Secondary | ICD-10-CM | POA: Diagnosis not present

## 2021-11-18 DIAGNOSIS — I129 Hypertensive chronic kidney disease with stage 1 through stage 4 chronic kidney disease, or unspecified chronic kidney disease: Secondary | ICD-10-CM | POA: Diagnosis not present

## 2021-11-18 DIAGNOSIS — I69320 Aphasia following cerebral infarction: Secondary | ICD-10-CM | POA: Diagnosis not present

## 2021-11-18 DIAGNOSIS — Z85118 Personal history of other malignant neoplasm of bronchus and lung: Secondary | ICD-10-CM | POA: Diagnosis not present

## 2021-11-18 DIAGNOSIS — E781 Pure hyperglyceridemia: Secondary | ICD-10-CM | POA: Diagnosis not present

## 2021-11-18 DIAGNOSIS — G4700 Insomnia, unspecified: Secondary | ICD-10-CM | POA: Diagnosis not present

## 2021-11-18 DIAGNOSIS — I63312 Cerebral infarction due to thrombosis of left middle cerebral artery: Secondary | ICD-10-CM | POA: Diagnosis not present

## 2021-11-18 DIAGNOSIS — J45909 Unspecified asthma, uncomplicated: Secondary | ICD-10-CM | POA: Diagnosis not present

## 2021-11-19 DIAGNOSIS — I69351 Hemiplegia and hemiparesis following cerebral infarction affecting right dominant side: Secondary | ICD-10-CM | POA: Diagnosis not present

## 2021-11-19 DIAGNOSIS — E559 Vitamin D deficiency, unspecified: Secondary | ICD-10-CM | POA: Diagnosis not present

## 2021-11-19 DIAGNOSIS — E781 Pure hyperglyceridemia: Secondary | ICD-10-CM | POA: Diagnosis not present

## 2021-11-19 DIAGNOSIS — I69320 Aphasia following cerebral infarction: Secondary | ICD-10-CM | POA: Diagnosis not present

## 2021-11-19 DIAGNOSIS — K219 Gastro-esophageal reflux disease without esophagitis: Secondary | ICD-10-CM | POA: Diagnosis not present

## 2021-11-19 DIAGNOSIS — Z85118 Personal history of other malignant neoplasm of bronchus and lung: Secondary | ICD-10-CM | POA: Diagnosis not present

## 2021-11-19 DIAGNOSIS — G4700 Insomnia, unspecified: Secondary | ICD-10-CM | POA: Diagnosis not present

## 2021-11-19 DIAGNOSIS — Z7902 Long term (current) use of antithrombotics/antiplatelets: Secondary | ICD-10-CM | POA: Diagnosis not present

## 2021-11-19 DIAGNOSIS — J45909 Unspecified asthma, uncomplicated: Secondary | ICD-10-CM | POA: Diagnosis not present

## 2021-11-19 DIAGNOSIS — H409 Unspecified glaucoma: Secondary | ICD-10-CM | POA: Diagnosis not present

## 2021-11-19 DIAGNOSIS — I63312 Cerebral infarction due to thrombosis of left middle cerebral artery: Secondary | ICD-10-CM | POA: Diagnosis not present

## 2021-11-19 DIAGNOSIS — M1990 Unspecified osteoarthritis, unspecified site: Secondary | ICD-10-CM | POA: Diagnosis not present

## 2021-11-19 DIAGNOSIS — M8580 Other specified disorders of bone density and structure, unspecified site: Secondary | ICD-10-CM | POA: Diagnosis not present

## 2021-11-19 DIAGNOSIS — Z96652 Presence of left artificial knee joint: Secondary | ICD-10-CM | POA: Diagnosis not present

## 2021-11-19 DIAGNOSIS — Z7982 Long term (current) use of aspirin: Secondary | ICD-10-CM | POA: Diagnosis not present

## 2021-11-19 DIAGNOSIS — I129 Hypertensive chronic kidney disease with stage 1 through stage 4 chronic kidney disease, or unspecified chronic kidney disease: Secondary | ICD-10-CM | POA: Diagnosis not present

## 2021-11-19 DIAGNOSIS — Z7951 Long term (current) use of inhaled steroids: Secondary | ICD-10-CM | POA: Diagnosis not present

## 2021-11-19 DIAGNOSIS — N1832 Chronic kidney disease, stage 3b: Secondary | ICD-10-CM | POA: Diagnosis not present

## 2021-11-19 DIAGNOSIS — Z9181 History of falling: Secondary | ICD-10-CM | POA: Diagnosis not present

## 2021-11-19 DIAGNOSIS — Z87891 Personal history of nicotine dependence: Secondary | ICD-10-CM | POA: Diagnosis not present

## 2021-11-24 DIAGNOSIS — Z7951 Long term (current) use of inhaled steroids: Secondary | ICD-10-CM | POA: Diagnosis not present

## 2021-11-24 DIAGNOSIS — J45909 Unspecified asthma, uncomplicated: Secondary | ICD-10-CM | POA: Diagnosis not present

## 2021-11-24 DIAGNOSIS — I69351 Hemiplegia and hemiparesis following cerebral infarction affecting right dominant side: Secondary | ICD-10-CM | POA: Diagnosis not present

## 2021-11-24 DIAGNOSIS — I129 Hypertensive chronic kidney disease with stage 1 through stage 4 chronic kidney disease, or unspecified chronic kidney disease: Secondary | ICD-10-CM | POA: Diagnosis not present

## 2021-11-24 DIAGNOSIS — K219 Gastro-esophageal reflux disease without esophagitis: Secondary | ICD-10-CM | POA: Diagnosis not present

## 2021-11-24 DIAGNOSIS — N1832 Chronic kidney disease, stage 3b: Secondary | ICD-10-CM | POA: Diagnosis not present

## 2021-11-24 DIAGNOSIS — Z7902 Long term (current) use of antithrombotics/antiplatelets: Secondary | ICD-10-CM | POA: Diagnosis not present

## 2021-11-24 DIAGNOSIS — E559 Vitamin D deficiency, unspecified: Secondary | ICD-10-CM | POA: Diagnosis not present

## 2021-11-24 DIAGNOSIS — I69320 Aphasia following cerebral infarction: Secondary | ICD-10-CM | POA: Diagnosis not present

## 2021-11-24 DIAGNOSIS — E781 Pure hyperglyceridemia: Secondary | ICD-10-CM | POA: Diagnosis not present

## 2021-11-24 DIAGNOSIS — Z87891 Personal history of nicotine dependence: Secondary | ICD-10-CM | POA: Diagnosis not present

## 2021-11-24 DIAGNOSIS — Z85118 Personal history of other malignant neoplasm of bronchus and lung: Secondary | ICD-10-CM | POA: Diagnosis not present

## 2021-11-24 DIAGNOSIS — G4700 Insomnia, unspecified: Secondary | ICD-10-CM | POA: Diagnosis not present

## 2021-11-24 DIAGNOSIS — Z96652 Presence of left artificial knee joint: Secondary | ICD-10-CM | POA: Diagnosis not present

## 2021-11-24 DIAGNOSIS — Z9181 History of falling: Secondary | ICD-10-CM | POA: Diagnosis not present

## 2021-11-24 DIAGNOSIS — M1990 Unspecified osteoarthritis, unspecified site: Secondary | ICD-10-CM | POA: Diagnosis not present

## 2021-11-24 DIAGNOSIS — Z7982 Long term (current) use of aspirin: Secondary | ICD-10-CM | POA: Diagnosis not present

## 2021-11-24 DIAGNOSIS — H409 Unspecified glaucoma: Secondary | ICD-10-CM | POA: Diagnosis not present

## 2021-11-24 DIAGNOSIS — M8580 Other specified disorders of bone density and structure, unspecified site: Secondary | ICD-10-CM | POA: Diagnosis not present

## 2021-11-24 DIAGNOSIS — I63312 Cerebral infarction due to thrombosis of left middle cerebral artery: Secondary | ICD-10-CM | POA: Diagnosis not present

## 2021-11-25 ENCOUNTER — Encounter (INDEPENDENT_AMBULATORY_CARE_PROVIDER_SITE_OTHER): Payer: Medicare Other | Admitting: Ophthalmology

## 2021-11-25 ENCOUNTER — Ambulatory Visit: Payer: Medicare Other | Admitting: Physician Assistant

## 2021-11-25 DIAGNOSIS — I10 Essential (primary) hypertension: Secondary | ICD-10-CM

## 2021-11-25 DIAGNOSIS — H43813 Vitreous degeneration, bilateral: Secondary | ICD-10-CM

## 2021-11-25 DIAGNOSIS — H35033 Hypertensive retinopathy, bilateral: Secondary | ICD-10-CM

## 2021-11-25 DIAGNOSIS — H353132 Nonexudative age-related macular degeneration, bilateral, intermediate dry stage: Secondary | ICD-10-CM

## 2021-11-25 DIAGNOSIS — H34831 Tributary (branch) retinal vein occlusion, right eye, with macular edema: Secondary | ICD-10-CM

## 2021-11-27 ENCOUNTER — Other Ambulatory Visit: Payer: Self-pay | Admitting: Registered Nurse

## 2021-11-27 DIAGNOSIS — M8580 Other specified disorders of bone density and structure, unspecified site: Secondary | ICD-10-CM | POA: Diagnosis not present

## 2021-11-27 DIAGNOSIS — Z87891 Personal history of nicotine dependence: Secondary | ICD-10-CM | POA: Diagnosis not present

## 2021-11-27 DIAGNOSIS — Z85118 Personal history of other malignant neoplasm of bronchus and lung: Secondary | ICD-10-CM | POA: Diagnosis not present

## 2021-11-27 DIAGNOSIS — N1832 Chronic kidney disease, stage 3b: Secondary | ICD-10-CM | POA: Diagnosis not present

## 2021-11-27 DIAGNOSIS — I63312 Cerebral infarction due to thrombosis of left middle cerebral artery: Secondary | ICD-10-CM | POA: Diagnosis not present

## 2021-11-27 DIAGNOSIS — I69320 Aphasia following cerebral infarction: Secondary | ICD-10-CM | POA: Diagnosis not present

## 2021-11-27 DIAGNOSIS — E781 Pure hyperglyceridemia: Secondary | ICD-10-CM | POA: Diagnosis not present

## 2021-11-27 DIAGNOSIS — H409 Unspecified glaucoma: Secondary | ICD-10-CM | POA: Diagnosis not present

## 2021-11-27 DIAGNOSIS — M1990 Unspecified osteoarthritis, unspecified site: Secondary | ICD-10-CM | POA: Diagnosis not present

## 2021-11-27 DIAGNOSIS — Z9181 History of falling: Secondary | ICD-10-CM | POA: Diagnosis not present

## 2021-11-27 DIAGNOSIS — I69351 Hemiplegia and hemiparesis following cerebral infarction affecting right dominant side: Secondary | ICD-10-CM | POA: Diagnosis not present

## 2021-11-27 DIAGNOSIS — Z7951 Long term (current) use of inhaled steroids: Secondary | ICD-10-CM | POA: Diagnosis not present

## 2021-11-27 DIAGNOSIS — I129 Hypertensive chronic kidney disease with stage 1 through stage 4 chronic kidney disease, or unspecified chronic kidney disease: Secondary | ICD-10-CM | POA: Diagnosis not present

## 2021-11-27 DIAGNOSIS — Z7902 Long term (current) use of antithrombotics/antiplatelets: Secondary | ICD-10-CM | POA: Diagnosis not present

## 2021-11-27 DIAGNOSIS — Z7982 Long term (current) use of aspirin: Secondary | ICD-10-CM | POA: Diagnosis not present

## 2021-11-27 DIAGNOSIS — J45909 Unspecified asthma, uncomplicated: Secondary | ICD-10-CM | POA: Diagnosis not present

## 2021-11-27 DIAGNOSIS — K219 Gastro-esophageal reflux disease without esophagitis: Secondary | ICD-10-CM | POA: Diagnosis not present

## 2021-11-27 DIAGNOSIS — Z96652 Presence of left artificial knee joint: Secondary | ICD-10-CM | POA: Diagnosis not present

## 2021-11-27 DIAGNOSIS — E559 Vitamin D deficiency, unspecified: Secondary | ICD-10-CM | POA: Diagnosis not present

## 2021-11-27 DIAGNOSIS — G4700 Insomnia, unspecified: Secondary | ICD-10-CM | POA: Diagnosis not present

## 2021-11-28 DIAGNOSIS — M8580 Other specified disorders of bone density and structure, unspecified site: Secondary | ICD-10-CM | POA: Diagnosis not present

## 2021-11-28 DIAGNOSIS — Z7982 Long term (current) use of aspirin: Secondary | ICD-10-CM | POA: Diagnosis not present

## 2021-11-28 DIAGNOSIS — Z7902 Long term (current) use of antithrombotics/antiplatelets: Secondary | ICD-10-CM | POA: Diagnosis not present

## 2021-11-28 DIAGNOSIS — I69320 Aphasia following cerebral infarction: Secondary | ICD-10-CM | POA: Diagnosis not present

## 2021-11-28 DIAGNOSIS — I63312 Cerebral infarction due to thrombosis of left middle cerebral artery: Secondary | ICD-10-CM | POA: Diagnosis not present

## 2021-11-28 DIAGNOSIS — I69351 Hemiplegia and hemiparesis following cerebral infarction affecting right dominant side: Secondary | ICD-10-CM | POA: Diagnosis not present

## 2021-11-28 DIAGNOSIS — M1990 Unspecified osteoarthritis, unspecified site: Secondary | ICD-10-CM | POA: Diagnosis not present

## 2021-11-28 DIAGNOSIS — E781 Pure hyperglyceridemia: Secondary | ICD-10-CM | POA: Diagnosis not present

## 2021-11-28 DIAGNOSIS — Z85118 Personal history of other malignant neoplasm of bronchus and lung: Secondary | ICD-10-CM | POA: Diagnosis not present

## 2021-11-28 DIAGNOSIS — Z96652 Presence of left artificial knee joint: Secondary | ICD-10-CM | POA: Diagnosis not present

## 2021-11-28 DIAGNOSIS — J45909 Unspecified asthma, uncomplicated: Secondary | ICD-10-CM | POA: Diagnosis not present

## 2021-11-28 DIAGNOSIS — Z7951 Long term (current) use of inhaled steroids: Secondary | ICD-10-CM | POA: Diagnosis not present

## 2021-11-28 DIAGNOSIS — I129 Hypertensive chronic kidney disease with stage 1 through stage 4 chronic kidney disease, or unspecified chronic kidney disease: Secondary | ICD-10-CM | POA: Diagnosis not present

## 2021-11-28 DIAGNOSIS — G4700 Insomnia, unspecified: Secondary | ICD-10-CM | POA: Diagnosis not present

## 2021-11-28 DIAGNOSIS — N1832 Chronic kidney disease, stage 3b: Secondary | ICD-10-CM | POA: Diagnosis not present

## 2021-11-28 DIAGNOSIS — Z9181 History of falling: Secondary | ICD-10-CM | POA: Diagnosis not present

## 2021-11-28 DIAGNOSIS — E559 Vitamin D deficiency, unspecified: Secondary | ICD-10-CM | POA: Diagnosis not present

## 2021-11-28 DIAGNOSIS — H409 Unspecified glaucoma: Secondary | ICD-10-CM | POA: Diagnosis not present

## 2021-11-28 DIAGNOSIS — K219 Gastro-esophageal reflux disease without esophagitis: Secondary | ICD-10-CM | POA: Diagnosis not present

## 2021-11-28 DIAGNOSIS — Z87891 Personal history of nicotine dependence: Secondary | ICD-10-CM | POA: Diagnosis not present

## 2021-12-03 DIAGNOSIS — Z87891 Personal history of nicotine dependence: Secondary | ICD-10-CM | POA: Diagnosis not present

## 2021-12-03 DIAGNOSIS — J45909 Unspecified asthma, uncomplicated: Secondary | ICD-10-CM | POA: Diagnosis not present

## 2021-12-03 DIAGNOSIS — E781 Pure hyperglyceridemia: Secondary | ICD-10-CM | POA: Diagnosis not present

## 2021-12-03 DIAGNOSIS — I69320 Aphasia following cerebral infarction: Secondary | ICD-10-CM | POA: Diagnosis not present

## 2021-12-03 DIAGNOSIS — Z7951 Long term (current) use of inhaled steroids: Secondary | ICD-10-CM | POA: Diagnosis not present

## 2021-12-03 DIAGNOSIS — Z85118 Personal history of other malignant neoplasm of bronchus and lung: Secondary | ICD-10-CM | POA: Diagnosis not present

## 2021-12-03 DIAGNOSIS — N1832 Chronic kidney disease, stage 3b: Secondary | ICD-10-CM | POA: Diagnosis not present

## 2021-12-03 DIAGNOSIS — Z7902 Long term (current) use of antithrombotics/antiplatelets: Secondary | ICD-10-CM | POA: Diagnosis not present

## 2021-12-03 DIAGNOSIS — I63312 Cerebral infarction due to thrombosis of left middle cerebral artery: Secondary | ICD-10-CM | POA: Diagnosis not present

## 2021-12-03 DIAGNOSIS — Z9181 History of falling: Secondary | ICD-10-CM | POA: Diagnosis not present

## 2021-12-03 DIAGNOSIS — I129 Hypertensive chronic kidney disease with stage 1 through stage 4 chronic kidney disease, or unspecified chronic kidney disease: Secondary | ICD-10-CM | POA: Diagnosis not present

## 2021-12-03 DIAGNOSIS — H409 Unspecified glaucoma: Secondary | ICD-10-CM | POA: Diagnosis not present

## 2021-12-03 DIAGNOSIS — I69351 Hemiplegia and hemiparesis following cerebral infarction affecting right dominant side: Secondary | ICD-10-CM | POA: Diagnosis not present

## 2021-12-03 DIAGNOSIS — M1990 Unspecified osteoarthritis, unspecified site: Secondary | ICD-10-CM | POA: Diagnosis not present

## 2021-12-03 DIAGNOSIS — Z7982 Long term (current) use of aspirin: Secondary | ICD-10-CM | POA: Diagnosis not present

## 2021-12-03 DIAGNOSIS — K219 Gastro-esophageal reflux disease without esophagitis: Secondary | ICD-10-CM | POA: Diagnosis not present

## 2021-12-03 DIAGNOSIS — Z96652 Presence of left artificial knee joint: Secondary | ICD-10-CM | POA: Diagnosis not present

## 2021-12-03 DIAGNOSIS — M8580 Other specified disorders of bone density and structure, unspecified site: Secondary | ICD-10-CM | POA: Diagnosis not present

## 2021-12-03 DIAGNOSIS — G4700 Insomnia, unspecified: Secondary | ICD-10-CM | POA: Diagnosis not present

## 2021-12-03 DIAGNOSIS — E559 Vitamin D deficiency, unspecified: Secondary | ICD-10-CM | POA: Diagnosis not present

## 2021-12-05 ENCOUNTER — Telehealth: Payer: Self-pay | Admitting: *Deleted

## 2021-12-05 DIAGNOSIS — I63312 Cerebral infarction due to thrombosis of left middle cerebral artery: Secondary | ICD-10-CM | POA: Diagnosis not present

## 2021-12-05 DIAGNOSIS — H409 Unspecified glaucoma: Secondary | ICD-10-CM | POA: Diagnosis not present

## 2021-12-05 DIAGNOSIS — Z7951 Long term (current) use of inhaled steroids: Secondary | ICD-10-CM | POA: Diagnosis not present

## 2021-12-05 DIAGNOSIS — K219 Gastro-esophageal reflux disease without esophagitis: Secondary | ICD-10-CM | POA: Diagnosis not present

## 2021-12-05 DIAGNOSIS — M8580 Other specified disorders of bone density and structure, unspecified site: Secondary | ICD-10-CM | POA: Diagnosis not present

## 2021-12-05 DIAGNOSIS — Z7982 Long term (current) use of aspirin: Secondary | ICD-10-CM | POA: Diagnosis not present

## 2021-12-05 DIAGNOSIS — N1832 Chronic kidney disease, stage 3b: Secondary | ICD-10-CM | POA: Diagnosis not present

## 2021-12-05 DIAGNOSIS — I129 Hypertensive chronic kidney disease with stage 1 through stage 4 chronic kidney disease, or unspecified chronic kidney disease: Secondary | ICD-10-CM | POA: Diagnosis not present

## 2021-12-05 DIAGNOSIS — Z7902 Long term (current) use of antithrombotics/antiplatelets: Secondary | ICD-10-CM | POA: Diagnosis not present

## 2021-12-05 DIAGNOSIS — Z9181 History of falling: Secondary | ICD-10-CM | POA: Diagnosis not present

## 2021-12-05 DIAGNOSIS — Z85118 Personal history of other malignant neoplasm of bronchus and lung: Secondary | ICD-10-CM | POA: Diagnosis not present

## 2021-12-05 DIAGNOSIS — M1990 Unspecified osteoarthritis, unspecified site: Secondary | ICD-10-CM | POA: Diagnosis not present

## 2021-12-05 DIAGNOSIS — J45909 Unspecified asthma, uncomplicated: Secondary | ICD-10-CM | POA: Diagnosis not present

## 2021-12-05 DIAGNOSIS — I69320 Aphasia following cerebral infarction: Secondary | ICD-10-CM | POA: Diagnosis not present

## 2021-12-05 DIAGNOSIS — Z87891 Personal history of nicotine dependence: Secondary | ICD-10-CM | POA: Diagnosis not present

## 2021-12-05 DIAGNOSIS — Z96652 Presence of left artificial knee joint: Secondary | ICD-10-CM | POA: Diagnosis not present

## 2021-12-05 DIAGNOSIS — G4700 Insomnia, unspecified: Secondary | ICD-10-CM | POA: Diagnosis not present

## 2021-12-05 DIAGNOSIS — E559 Vitamin D deficiency, unspecified: Secondary | ICD-10-CM | POA: Diagnosis not present

## 2021-12-05 DIAGNOSIS — E781 Pure hyperglyceridemia: Secondary | ICD-10-CM | POA: Diagnosis not present

## 2021-12-05 DIAGNOSIS — I69351 Hemiplegia and hemiparesis following cerebral infarction affecting right dominant side: Secondary | ICD-10-CM | POA: Diagnosis not present

## 2021-12-05 NOTE — Telephone Encounter (Signed)
[facility] called for cont ST 1wk1, 2wk8 to continue working on cognition and expressive aphasia.  Approval given.

## 2021-12-10 NOTE — Progress Notes (Signed)
[facility] [address] [place]. [place] [id number] [phone]       HOSPITAL FOLLOW UP NOTE  [name] Date of Birth:  1940-07-20 Medical Record Number:  [id number]   Reason for Referral:  hospital stroke follow up    SUBJECTIVE:   CHIEF COMPLAINT:  Chief Complaint  Patient presents with   Follow-up    RM 3 with daughter [name] Pt is well, having some speech impairment, imbalance, and R sided weakness. Also cognition per daughter     HPI:   81 year old female with history of hypertension, CKD, lung cancer status post lobectomy who presented on 09/21/2021 with aphasia after fall and right-sided weakness.  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  CT head and neck showed distal left M2 occlusion, right ICA 70% stenosis, bilateral siphon and left ICA bulb atherosclerosis.  MRI showed left MCA moderate-sized infarct as well as old infarcts of the left basal ganglia and both cerebellar hemispheres.  LE venous Doppler no DVT.  EF 60 to 65%.  LDL 69, A1c 5.2.  Evaluated by Dr. Erlinda Hong, etiology likely due to intracranial stenosis given diffuse intracranial arthrosclerosis however cardioembolic source cannot be completely ruled out.  Recommended 30-day cardiac event monitor to rule out A-fib.  Recommended DAPT for 3 months then aspirin alone and continue fenofibrate.  Therapy eval's recommended CIR for ongoing therapy needs.   Today, 12/11/2021, patient is being seen for initial hospital follow-up accompanied by her daughter. She has been doing well since discharge. Currently working with [facility] PT and SLP. Completed OT. Reports continued aphasia, right leg weakness, gait impairment and cognitive impairment, has been gradually improving. She lives alone, has aides that stay with her for 12 hours per day. Daughter questions if she still needs as much supervision. Daughter lives in [place], she works as a Astronomer. She is concerned that her  right foot dragged some while walking back to exam room, she has not noticed that before. Patient has been experiencing increased ankle pain over the past few weeks. Does report occasional dragging, "it comes and goes". Did have ankle pain post fall, x-ray during admission no acute findings.  Denies new stroke/TIA symptoms.  Remains on both aspirin and Plavix as well as fenofibrate, denies side effects.  Blood pressure today 128/75.  Home health routinely monitors which has been stable.  Has since seen PCP since discharge.  No further concerns at this time.       PERTINENT IMAGING  Per hospitalization 09/21/2021 Code Stroke CT head: No acute intracranial abnormality. Small right frontal parietal scalp hematoma.  No acute cervical spine fractures or supplementation.  Chronic lacunar infarcts in the left caudate head, new since 2016. CTA head: Occlusion of the distal left MCA M2 branch, at the posterior sylvian fissure, corresponding to a location of previously identified infarct. CTA neck: 70% stenosis of the proximal right ICA secondary to predominantly calcified atherosclerosis. MRI: Early subacute infarct within the left MCA territory involving the left parietal and temporal lobes.  No other side of acute ischemia.  There are old infarcts of the left basal ganglia and both cerebellar hemisphere. 2D echo, EF 60 to 65% LE venous Doppler negative bilaterally for DVT    ROS:   14 system review of systems performed and negative with exception of those listed in HPI  PMH:  Past Medical History:  Diagnosis Date    Multiple pulmonary nodules, largest R mid lung 06/29/2013  Followed in Pulmonary clinic/ [facility]/ [facility] - see CXR 06/27/13  - CT chest 07/14/2013 > 1. No acute findings in the thorax to account for the patient's  symptoms.  2. However, there is a subsolid nodule in the   right lower lobe that has a ground-glass attenuation component  measuring 2.5 x 1.8 cm, and a small central  solid component  measuring 4 mm. Initial follow-up by chest CT without    Adenocarcinoma of lung, stage 1 ([place])    Status post right lower lobectomy August 2015   Anxiety    Asthma    Atrophic vaginitis    Chronic cough 12/20/2018   Chronic iritis of left eye    Pupil stays dilated; nonreactive   Chronic kidney disease, stage 3b ([facility])    Colon polyp    Contact lens/glasses fitting    wears contacts or glasses   Cough variant asthma with component of uacs  05/31/2013   Followed in Pulmonary clinic/ [facility]/ [facility] [place] in her 47's - Spirometry 02/23/13 wnl  - 07/03/2013 Sinus CT > Mild chronic sinus disease - No acute findings. Left-to-right nasal septal deviation of 3 mm. -med calendar 08/08/13 , redone 06/01/2016 and 02/22/2017  - Eos 4%   10/13/2013  > rec singulair daily  - FENO 05/12/2016  =   24 on singulair  - Allergy profile 05/12/2016 >   IgE  47 pos   Depression    Depression with anxiety    Dysuria 08/03/2019   Environmental allergies    Essential hypertension 02/23/2017   Changed losartan to avapro 02/22/2017 due to cough    GERD (gastroesophageal reflux disease)    Hypertension    Hypertriglyceridemia    Kidney stones    OA (osteoarthritis)    Osteopenia    PONV (postoperative nausea and vomiting)    S/P lobectomy of lung 02/12/2014   Status post total knee replacement, left 08/26/2018   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    PSH:  Past Surgical History:  Procedure Laterality Date   APPENDECTOMY     COLONOSCOPY     EYE SURGERY Left    cataract removal   ORIF WRIST FRACTURE Left 08/04/2013   Procedure: OPEN REDUCTION INTERNAL FIXATION (ORIF) LEFT DISTAL RADIUS WRIST FRACTURE;  Surgeon: Wynonia Sours, MD;  Location: [facility];  Service: Orthopedics;  Laterality: Left;   PARTIAL HYSTERECTOMY     TOTAL KNEE ARTHROPLASTY Left 08/26/2018   Procedure: TOTAL KNEE ARTHROPLASTY;  Surgeon: Netta Cedars, MD;  Location: [facility];  Service: Orthopedics;   Laterality: Left;   VIDEO ASSISTED THORACOSCOPY (VATS)/WEDGE RESECTION Right 02/12/2014   Procedure: RIGHT VIDEO ASSISTED THORACOSCOPY RIGHT LOWER LOBE LUNG /WEDGE RESECTION,RIGHT THORACOTOMY WITH RIGHT LOWER LOBE LOBECTOMY & NODE DISSECTION;  Surgeon: Melrose Nakayama, MD;  Location: [facility];  Service: Thoracic;  Laterality: Right;   VIDEO ASSISTED THORACOSCOPY (VATS)/WEDGE RESECTION Right 02/12/2014   VIDEO BRONCHOSCOPY N/A 02/12/2014   Procedure: VIDEO BRONCHOSCOPY;  Surgeon: Melrose Nakayama, MD;  Location: [facility];  Service: Thoracic;  Laterality: N/A;    Social History:  Social History   Socioeconomic History   Marital status: Widowed    Spouse name: Not on file   Number of children: 2   Years of education: Not on file   Highest education level: Not on file  Occupational History   Occupation: Engineer, 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  Tobacco Use   Smoking status: Former    Packs/day: 0.75    Years:  20.00    Total pack years: 15.00    Types: Cigarettes    Quit date: 1980    Years since quitting: 43.4   Smokeless tobacco: Never  Vaping Use   Vaping Use: Never used  Substance and Sexual Activity   Alcohol use: Yes    Alcohol/week: 1.0 standard drink of alcohol    Types: 1 Glasses of wine per week    Comment: occasional   Drug use: No   Sexual activity: Not on file  Other Topics Concern   Not on file  Social History Narrative   ** Merged History Encounter **       Social Determinants of Health   Financial Resource Strain: Not on file  Food Insecurity: Not on file  Transportation Needs: Not on file  Physical Activity: Not on file  Stress: Not on file  Social Connections: Not on file  Intimate Partner Violence: Not on file    Family History:  Family History  Problem Relation Age of Onset   Stroke Father    Asthma Father    Heart disease Father        CABG x 2   Lung cancer Sister        smoked   Breast cancer Daughter 72    Medications:   Current Outpatient Medications  on File Prior to Visit  Medication Sig Dispense Refill   albuterol (VENTOLIN HFA) 108 (90 Base) MCG/ACT inhaler TAKE 2 PUFFS BY MOUTH EVERY 6 HOURS AS NEEDED FOR WHEEZE OR SHORTNESS OF BREATH 6.7 each 5   amLODipine (NORVASC) 10 MG tablet Take 1 tablet (10 mg total) by mouth every morning. 10 tablet 0   Ascorbic Acid (VITAMIN C) 100 MG tablet Take 1 tablet (100 mg total) by mouth daily. 30 tablet 0   budesonide-formoterol (SYMBICORT) 80-4.5 MCG/ACT inhaler INHALE 2 PUFFS BY MOUTH INTO THE LUNGS DAILY 30.6 g 3   Calcium-Magnesium-Vitamin D (CALCIUM 1200+D3 PO) Take 1 tablet by mouth daily.      Cholecalciferol (VITAMIN D3 PO) Take 1 capsule by mouth daily.     clopidogrel (PLAVIX) 75 MG tablet TAKE 1 TABLET BY MOUTH EVERY DAY 30 tablet 0   diclofenac Sodium (VOLTAREN) 1 % GEL Apply 2 g topically 4 (four) times daily as needed (Foot pain). 350 g 0   dorzolamide-timolol (COSOPT) 22.3-6.8 MG/ML ophthalmic solution Place 1 drop into both eyes 2 (two) times daily.     famotidine (PEPCID) 20 MG tablet Take 20 mg by mouth at bedtime.     fenofibrate 160 MG tablet Take 160 mg by mouth daily.     FLUoxetine (PROZAC) 40 MG capsule Take 40 mg by mouth daily.      gabapentin (NEURONTIN) 100 MG capsule Take 1 capsule (100 mg total) by mouth 3 (three) times daily. One three times daily 270 capsule 1   montelukast (SINGULAIR) 10 MG tablet Take 1 tablet (10 mg total) by mouth at bedtime. 90 tablet 0   Multiple Vitamins-Minerals (CENTRUM SILVER PO) Take 1 tablet by mouth daily.     solifenacin (VESICARE) 10 MG tablet Take 10 mg by mouth daily.     traZODone (DESYREL) 50 MG tablet Take 50 mg by mouth at bedtime.     No current facility-administered medications on file prior to visit.    Allergies:   Allergies  Allergen Reactions   Grass Extracts [Gramineae Pollens] Other (See Comments)    Unknown   Nsaids Other (See Comments)    Elevated kidney  fx   Prednisone Nausea And Vomiting and Other (See Comments)     Can take Prednisone injection but not orally      OBJECTIVE:  Physical Exam  Vitals:   12/11/21 1028  BP: 128/75  Pulse: 66  Weight: 173 lb (78.5 kg)  Height: 5\' 2"  (1.575 m)   Body mass index is 31.64 kg/m. No results found.  General: well developed, well nourished, very pleasant elderly Caucasian female, seated, in no evident distress Head: head normocephalic and atraumatic.   Neck: supple with no carotid or supraclavicular bruits Cardiovascular: regular rate and rhythm, no murmurs Musculoskeletal: no deformity Skin:  no rash/petichiae Vascular:  Normal pulses all extremities   Neurologic Exam Mental Status: Awake and fully alert.  Moderate aphasia.  Unable to appreciate receptive aphasia.  Follows commands without difficulty.  Oriented to place and time. Recent memory impaired and remote memory intact. Attention span, concentration and fund of knowledge mildly diminished. Mood and affect appropriate.  Cranial Nerves: Fundoscopic exam reveals sharp disc margins. Pupils equal, briskly reactive to light. Extraocular movements full without nystagmus. Visual fields full to confrontation. Hearing intact. Facial sensation intact. Face, tongue, palate moves normally and symmetrically.  Motor: Normal bulk and tone. Normal strength in all tested extremity muscles except mild RLE weakness at hip flexor and ankle dorsiflexion Sensory.: intact to touch , pinprick , position and vibratory sensation.  Coordination: Rapid alternating movements normal in all extremities. Finger-to-nose and heel-to-shin performed accurately bilaterally. Gait and Station: Arises from chair with mild difficulty. Stance is slightly hunched. Gait demonstrates slow cautious steps with decreased stride length and step height RLE, foot slightly turned out, occasionally hitting RW.  Tandem walk and heel toe not attempted Reflexes: 1+ and symmetric. Toes downgoing.     NIHSS  1 Modified Rankin   3      ASSESSMENT: [name] is a 81 y.o. 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 Vascular risk factors include HTN, HLD, advanced age, former tobacco use, and prior strokes on imaging.      PLAN:  L MCA stroke :  Residual deficit: RLE weakness, gait impairment, aphasia and cognitive impairment.  Encouraged continued participation with home health therapies and likely transition to outpatient therapies once completed. Advised daughter to further discuss safety and supervision needs with current therapist Continue aspirin 81 mg daily and clopidogrel 75 mg daily for additional 2 weeks and aspirin alone and continue fenofibrate for secondary stroke prevention.   Is following with cardiology regarding cardiac monitor Discussed secondary stroke prevention measures and importance of close PCP follow up for aggressive stroke risk factor management including BP goal<130/90 and HLD with LDL goal<70.  Stroke labs 09/2021: LDL 69, 5.2 I have gone over the pathophysiology of stroke, warning signs and symptoms, risk factors and their management in some detail with instructions to go to the closest emergency room for symptoms of concern. R ICA stenosis: CTA 09/2021 70% stenosis. Plan to obtain carotid ultrasound around September for surveillance monitoring Right ankle pain: post fall. Xray during admission unremarkable.  Encouraged use of ice, compression and gentle stretching as well as further discussed with HH PT. Can consider advanced imaging if symptoms persist but did encouraged to further discuss with PCP    Follow up in 3 months or call earlier if needed   CC:  [place] provider: Dr. Leonie Man PCP: Faustino Congress, NP    I spent 59 minutes of face-to-face and non-face-to-face time  with patient and daughter.  This included previsit chart review including review of recent hospitalization, lab review, study review, electronic health record documentation, patient and daughter education regarding recent stroke including etiology, secondary stroke prevention measures and importance of managing stroke risk factors, residual deficits and typical recovery time and answered all other questions to patient satisfaction   Frann Rider, AGNP-BC  [facility] [address] [place], [place] [address]  Phone [phone] Fax [phone] Note: This document was prepared with digital dictation and possible smart phrase technology. Any transcriptional errors that result from this process are unintentional.

## 2021-12-11 ENCOUNTER — Ambulatory Visit: Payer: Medicare Other | Admitting: Adult Health

## 2021-12-11 ENCOUNTER — Encounter: Payer: Self-pay | Admitting: Adult Health

## 2021-12-11 VITALS — BP 128/75 | HR 66 | Ht 62.0 in | Wt 173.0 lb

## 2021-12-11 DIAGNOSIS — Z09 Encounter for follow-up examination after completed treatment for conditions other than malignant neoplasm: Secondary | ICD-10-CM | POA: Diagnosis not present

## 2021-12-11 DIAGNOSIS — I63312 Cerebral infarction due to thrombosis of left middle cerebral artery: Secondary | ICD-10-CM | POA: Diagnosis not present

## 2021-12-11 DIAGNOSIS — I6521 Occlusion and stenosis of right carotid artery: Secondary | ICD-10-CM

## 2021-12-11 DIAGNOSIS — G8929 Other chronic pain: Secondary | ICD-10-CM

## 2021-12-11 DIAGNOSIS — M25571 Pain in right ankle and joints of right foot: Secondary | ICD-10-CM

## 2021-12-11 NOTE — Patient Instructions (Addendum)
Continue working with home health therapy for hopeful ongoing recovery - let me know if you need assistance switching to outpatient therapy   Continue aspirin and fenofibrate for secondary stroke prevention measures   You can stop plavix after 2 weeks as 3 months both aspirin and plavix will be completed at that time  Continue to follow up with PCP regarding cholesterol and blood pressure management  Maintain strict control of hypertension with blood pressure goal below 130/90 and cholesterol with LDL cholesterol (bad cholesterol) goal below 70 mg/dL.   Signs of a Stroke? Follow the BEFAST method:  Balance Watch for a sudden loss of balance, trouble with coordination or vertigo Eyes Is there a sudden loss of vision in one or both eyes? Or double vision?  Face: Ask the person to smile. Does one side of the face droop or is it numb?  Arms: Ask the person to raise both arms. Does one arm drift downward? Is there weakness or numbness of a leg? Speech: Ask the person to repeat a simple phrase. Does the speech sound slurred/strange? Is the person confused ? Time: If you observe any of these signs, call 911.     Followup in the future with me in 3 months or call earlier if needed     Thank you for coming to see [place] at [facility]. I hope we have been able to provide you high quality care today.  You may receive a patient satisfaction survey over the next few weeks. We would appreciate your feedback and comments so that we may continue to improve ourselves and the health of our patients.

## 2021-12-16 DIAGNOSIS — Z7902 Long term (current) use of antithrombotics/antiplatelets: Secondary | ICD-10-CM | POA: Diagnosis not present

## 2021-12-16 DIAGNOSIS — M8580 Other specified disorders of bone density and structure, unspecified site: Secondary | ICD-10-CM | POA: Diagnosis not present

## 2021-12-16 DIAGNOSIS — I129 Hypertensive chronic kidney disease with stage 1 through stage 4 chronic kidney disease, or unspecified chronic kidney disease: Secondary | ICD-10-CM | POA: Diagnosis not present

## 2021-12-16 DIAGNOSIS — H409 Unspecified glaucoma: Secondary | ICD-10-CM | POA: Diagnosis not present

## 2021-12-16 DIAGNOSIS — N1832 Chronic kidney disease, stage 3b: Secondary | ICD-10-CM | POA: Diagnosis not present

## 2021-12-16 DIAGNOSIS — Z87891 Personal history of nicotine dependence: Secondary | ICD-10-CM | POA: Diagnosis not present

## 2021-12-16 DIAGNOSIS — I69320 Aphasia following cerebral infarction: Secondary | ICD-10-CM | POA: Diagnosis not present

## 2021-12-16 DIAGNOSIS — E559 Vitamin D deficiency, unspecified: Secondary | ICD-10-CM | POA: Diagnosis not present

## 2021-12-16 DIAGNOSIS — J45909 Unspecified asthma, uncomplicated: Secondary | ICD-10-CM | POA: Diagnosis not present

## 2021-12-16 DIAGNOSIS — K2100 Gastro-esophageal reflux disease with esophagitis, without bleeding: Secondary | ICD-10-CM | POA: Diagnosis not present

## 2021-12-16 DIAGNOSIS — E781 Pure hyperglyceridemia: Secondary | ICD-10-CM | POA: Diagnosis not present

## 2021-12-16 DIAGNOSIS — I69351 Hemiplegia and hemiparesis following cerebral infarction affecting right dominant side: Secondary | ICD-10-CM | POA: Diagnosis not present

## 2021-12-16 DIAGNOSIS — G4700 Insomnia, unspecified: Secondary | ICD-10-CM | POA: Diagnosis not present

## 2021-12-16 DIAGNOSIS — M1990 Unspecified osteoarthritis, unspecified site: Secondary | ICD-10-CM | POA: Diagnosis not present

## 2021-12-16 DIAGNOSIS — Z96652 Presence of left artificial knee joint: Secondary | ICD-10-CM | POA: Diagnosis not present

## 2021-12-16 DIAGNOSIS — Z9181 History of falling: Secondary | ICD-10-CM | POA: Diagnosis not present

## 2021-12-16 DIAGNOSIS — Z7982 Long term (current) use of aspirin: Secondary | ICD-10-CM | POA: Diagnosis not present

## 2021-12-16 DIAGNOSIS — Z85118 Personal history of other malignant neoplasm of bronchus and lung: Secondary | ICD-10-CM | POA: Diagnosis not present

## 2021-12-19 DIAGNOSIS — Z85118 Personal history of other malignant neoplasm of bronchus and lung: Secondary | ICD-10-CM | POA: Diagnosis not present

## 2021-12-19 DIAGNOSIS — Z7982 Long term (current) use of aspirin: Secondary | ICD-10-CM | POA: Diagnosis not present

## 2021-12-19 DIAGNOSIS — I69320 Aphasia following cerebral infarction: Secondary | ICD-10-CM | POA: Diagnosis not present

## 2021-12-19 DIAGNOSIS — Z87891 Personal history of nicotine dependence: Secondary | ICD-10-CM | POA: Diagnosis not present

## 2021-12-19 DIAGNOSIS — M1990 Unspecified osteoarthritis, unspecified site: Secondary | ICD-10-CM | POA: Diagnosis not present

## 2021-12-19 DIAGNOSIS — J45909 Unspecified asthma, uncomplicated: Secondary | ICD-10-CM | POA: Diagnosis not present

## 2021-12-19 DIAGNOSIS — I69351 Hemiplegia and hemiparesis following cerebral infarction affecting right dominant side: Secondary | ICD-10-CM | POA: Diagnosis not present

## 2021-12-19 DIAGNOSIS — Z9181 History of falling: Secondary | ICD-10-CM | POA: Diagnosis not present

## 2021-12-19 DIAGNOSIS — E559 Vitamin D deficiency, unspecified: Secondary | ICD-10-CM | POA: Diagnosis not present

## 2021-12-19 DIAGNOSIS — K2100 Gastro-esophageal reflux disease with esophagitis, without bleeding: Secondary | ICD-10-CM | POA: Diagnosis not present

## 2021-12-19 DIAGNOSIS — N1832 Chronic kidney disease, stage 3b: Secondary | ICD-10-CM | POA: Diagnosis not present

## 2021-12-19 DIAGNOSIS — M8580 Other specified disorders of bone density and structure, unspecified site: Secondary | ICD-10-CM | POA: Diagnosis not present

## 2021-12-19 DIAGNOSIS — E781 Pure hyperglyceridemia: Secondary | ICD-10-CM | POA: Diagnosis not present

## 2021-12-19 DIAGNOSIS — I129 Hypertensive chronic kidney disease with stage 1 through stage 4 chronic kidney disease, or unspecified chronic kidney disease: Secondary | ICD-10-CM | POA: Diagnosis not present

## 2021-12-19 DIAGNOSIS — Z7902 Long term (current) use of antithrombotics/antiplatelets: Secondary | ICD-10-CM | POA: Diagnosis not present

## 2021-12-19 DIAGNOSIS — Z96652 Presence of left artificial knee joint: Secondary | ICD-10-CM | POA: Diagnosis not present

## 2021-12-19 DIAGNOSIS — G4700 Insomnia, unspecified: Secondary | ICD-10-CM | POA: Diagnosis not present

## 2021-12-19 DIAGNOSIS — H409 Unspecified glaucoma: Secondary | ICD-10-CM | POA: Diagnosis not present

## 2021-12-22 DIAGNOSIS — J45909 Unspecified asthma, uncomplicated: Secondary | ICD-10-CM | POA: Diagnosis not present

## 2021-12-22 DIAGNOSIS — K2100 Gastro-esophageal reflux disease with esophagitis, without bleeding: Secondary | ICD-10-CM | POA: Diagnosis not present

## 2021-12-22 DIAGNOSIS — M8580 Other specified disorders of bone density and structure, unspecified site: Secondary | ICD-10-CM | POA: Diagnosis not present

## 2021-12-22 DIAGNOSIS — Z96652 Presence of left artificial knee joint: Secondary | ICD-10-CM | POA: Diagnosis not present

## 2021-12-22 DIAGNOSIS — I129 Hypertensive chronic kidney disease with stage 1 through stage 4 chronic kidney disease, or unspecified chronic kidney disease: Secondary | ICD-10-CM | POA: Diagnosis not present

## 2021-12-22 DIAGNOSIS — Z9181 History of falling: Secondary | ICD-10-CM | POA: Diagnosis not present

## 2021-12-22 DIAGNOSIS — E781 Pure hyperglyceridemia: Secondary | ICD-10-CM | POA: Diagnosis not present

## 2021-12-22 DIAGNOSIS — I69320 Aphasia following cerebral infarction: Secondary | ICD-10-CM | POA: Diagnosis not present

## 2021-12-22 DIAGNOSIS — Z87891 Personal history of nicotine dependence: Secondary | ICD-10-CM | POA: Diagnosis not present

## 2021-12-22 DIAGNOSIS — N1832 Chronic kidney disease, stage 3b: Secondary | ICD-10-CM | POA: Diagnosis not present

## 2021-12-22 DIAGNOSIS — Z7982 Long term (current) use of aspirin: Secondary | ICD-10-CM | POA: Diagnosis not present

## 2021-12-22 DIAGNOSIS — Z85118 Personal history of other malignant neoplasm of bronchus and lung: Secondary | ICD-10-CM | POA: Diagnosis not present

## 2021-12-22 DIAGNOSIS — Z7902 Long term (current) use of antithrombotics/antiplatelets: Secondary | ICD-10-CM | POA: Diagnosis not present

## 2021-12-22 DIAGNOSIS — H409 Unspecified glaucoma: Secondary | ICD-10-CM | POA: Diagnosis not present

## 2021-12-22 DIAGNOSIS — M1990 Unspecified osteoarthritis, unspecified site: Secondary | ICD-10-CM | POA: Diagnosis not present

## 2021-12-22 DIAGNOSIS — E559 Vitamin D deficiency, unspecified: Secondary | ICD-10-CM | POA: Diagnosis not present

## 2021-12-22 DIAGNOSIS — I69351 Hemiplegia and hemiparesis following cerebral infarction affecting right dominant side: Secondary | ICD-10-CM | POA: Diagnosis not present

## 2021-12-22 DIAGNOSIS — G4700 Insomnia, unspecified: Secondary | ICD-10-CM | POA: Diagnosis not present

## 2021-12-23 DIAGNOSIS — Z7902 Long term (current) use of antithrombotics/antiplatelets: Secondary | ICD-10-CM | POA: Diagnosis not present

## 2021-12-23 DIAGNOSIS — I69320 Aphasia following cerebral infarction: Secondary | ICD-10-CM | POA: Diagnosis not present

## 2021-12-23 DIAGNOSIS — K2100 Gastro-esophageal reflux disease with esophagitis, without bleeding: Secondary | ICD-10-CM | POA: Diagnosis not present

## 2021-12-23 DIAGNOSIS — Z7982 Long term (current) use of aspirin: Secondary | ICD-10-CM | POA: Diagnosis not present

## 2021-12-23 DIAGNOSIS — G4700 Insomnia, unspecified: Secondary | ICD-10-CM | POA: Diagnosis not present

## 2021-12-23 DIAGNOSIS — Z96652 Presence of left artificial knee joint: Secondary | ICD-10-CM | POA: Diagnosis not present

## 2021-12-23 DIAGNOSIS — E559 Vitamin D deficiency, unspecified: Secondary | ICD-10-CM | POA: Diagnosis not present

## 2021-12-23 DIAGNOSIS — Z9181 History of falling: Secondary | ICD-10-CM | POA: Diagnosis not present

## 2021-12-23 DIAGNOSIS — I129 Hypertensive chronic kidney disease with stage 1 through stage 4 chronic kidney disease, or unspecified chronic kidney disease: Secondary | ICD-10-CM | POA: Diagnosis not present

## 2021-12-23 DIAGNOSIS — Z85118 Personal history of other malignant neoplasm of bronchus and lung: Secondary | ICD-10-CM | POA: Diagnosis not present

## 2021-12-23 DIAGNOSIS — M1990 Unspecified osteoarthritis, unspecified site: Secondary | ICD-10-CM | POA: Diagnosis not present

## 2021-12-23 DIAGNOSIS — J45909 Unspecified asthma, uncomplicated: Secondary | ICD-10-CM | POA: Diagnosis not present

## 2021-12-23 DIAGNOSIS — N1832 Chronic kidney disease, stage 3b: Secondary | ICD-10-CM | POA: Diagnosis not present

## 2021-12-23 DIAGNOSIS — E781 Pure hyperglyceridemia: Secondary | ICD-10-CM | POA: Diagnosis not present

## 2021-12-23 DIAGNOSIS — I69351 Hemiplegia and hemiparesis following cerebral infarction affecting right dominant side: Secondary | ICD-10-CM | POA: Diagnosis not present

## 2021-12-23 DIAGNOSIS — Z87891 Personal history of nicotine dependence: Secondary | ICD-10-CM | POA: Diagnosis not present

## 2021-12-23 DIAGNOSIS — H409 Unspecified glaucoma: Secondary | ICD-10-CM | POA: Diagnosis not present

## 2021-12-23 DIAGNOSIS — M8580 Other specified disorders of bone density and structure, unspecified site: Secondary | ICD-10-CM | POA: Diagnosis not present

## 2021-12-26 DIAGNOSIS — Z87891 Personal history of nicotine dependence: Secondary | ICD-10-CM | POA: Diagnosis not present

## 2021-12-26 DIAGNOSIS — J45909 Unspecified asthma, uncomplicated: Secondary | ICD-10-CM | POA: Diagnosis not present

## 2021-12-26 DIAGNOSIS — Z9181 History of falling: Secondary | ICD-10-CM | POA: Diagnosis not present

## 2021-12-26 DIAGNOSIS — Z7902 Long term (current) use of antithrombotics/antiplatelets: Secondary | ICD-10-CM | POA: Diagnosis not present

## 2021-12-26 DIAGNOSIS — G4700 Insomnia, unspecified: Secondary | ICD-10-CM | POA: Diagnosis not present

## 2021-12-26 DIAGNOSIS — H409 Unspecified glaucoma: Secondary | ICD-10-CM | POA: Diagnosis not present

## 2021-12-26 DIAGNOSIS — M8580 Other specified disorders of bone density and structure, unspecified site: Secondary | ICD-10-CM | POA: Diagnosis not present

## 2021-12-26 DIAGNOSIS — M1990 Unspecified osteoarthritis, unspecified site: Secondary | ICD-10-CM | POA: Diagnosis not present

## 2021-12-26 DIAGNOSIS — I129 Hypertensive chronic kidney disease with stage 1 through stage 4 chronic kidney disease, or unspecified chronic kidney disease: Secondary | ICD-10-CM | POA: Diagnosis not present

## 2021-12-26 DIAGNOSIS — E559 Vitamin D deficiency, unspecified: Secondary | ICD-10-CM | POA: Diagnosis not present

## 2021-12-26 DIAGNOSIS — Z96652 Presence of left artificial knee joint: Secondary | ICD-10-CM | POA: Diagnosis not present

## 2021-12-26 DIAGNOSIS — Z7982 Long term (current) use of aspirin: Secondary | ICD-10-CM | POA: Diagnosis not present

## 2021-12-26 DIAGNOSIS — N1832 Chronic kidney disease, stage 3b: Secondary | ICD-10-CM | POA: Diagnosis not present

## 2021-12-26 DIAGNOSIS — K2100 Gastro-esophageal reflux disease with esophagitis, without bleeding: Secondary | ICD-10-CM | POA: Diagnosis not present

## 2021-12-26 DIAGNOSIS — I69351 Hemiplegia and hemiparesis following cerebral infarction affecting right dominant side: Secondary | ICD-10-CM | POA: Diagnosis not present

## 2021-12-26 DIAGNOSIS — Z85118 Personal history of other malignant neoplasm of bronchus and lung: Secondary | ICD-10-CM | POA: Diagnosis not present

## 2021-12-26 DIAGNOSIS — I69320 Aphasia following cerebral infarction: Secondary | ICD-10-CM | POA: Diagnosis not present

## 2021-12-26 DIAGNOSIS — E781 Pure hyperglyceridemia: Secondary | ICD-10-CM | POA: Diagnosis not present

## 2021-12-29 DIAGNOSIS — K2100 Gastro-esophageal reflux disease with esophagitis, without bleeding: Secondary | ICD-10-CM | POA: Diagnosis not present

## 2021-12-29 DIAGNOSIS — I129 Hypertensive chronic kidney disease with stage 1 through stage 4 chronic kidney disease, or unspecified chronic kidney disease: Secondary | ICD-10-CM | POA: Diagnosis not present

## 2021-12-29 DIAGNOSIS — E781 Pure hyperglyceridemia: Secondary | ICD-10-CM | POA: Diagnosis not present

## 2021-12-29 DIAGNOSIS — I69320 Aphasia following cerebral infarction: Secondary | ICD-10-CM | POA: Diagnosis not present

## 2021-12-29 DIAGNOSIS — Z96652 Presence of left artificial knee joint: Secondary | ICD-10-CM | POA: Diagnosis not present

## 2021-12-29 DIAGNOSIS — Z9181 History of falling: Secondary | ICD-10-CM | POA: Diagnosis not present

## 2021-12-29 DIAGNOSIS — Z7982 Long term (current) use of aspirin: Secondary | ICD-10-CM | POA: Diagnosis not present

## 2021-12-29 DIAGNOSIS — I69351 Hemiplegia and hemiparesis following cerebral infarction affecting right dominant side: Secondary | ICD-10-CM | POA: Diagnosis not present

## 2021-12-29 DIAGNOSIS — Z87891 Personal history of nicotine dependence: Secondary | ICD-10-CM | POA: Diagnosis not present

## 2021-12-29 DIAGNOSIS — M8580 Other specified disorders of bone density and structure, unspecified site: Secondary | ICD-10-CM | POA: Diagnosis not present

## 2021-12-29 DIAGNOSIS — H409 Unspecified glaucoma: Secondary | ICD-10-CM | POA: Diagnosis not present

## 2021-12-29 DIAGNOSIS — Z7902 Long term (current) use of antithrombotics/antiplatelets: Secondary | ICD-10-CM | POA: Diagnosis not present

## 2021-12-29 DIAGNOSIS — G4700 Insomnia, unspecified: Secondary | ICD-10-CM | POA: Diagnosis not present

## 2021-12-29 DIAGNOSIS — Z85118 Personal history of other malignant neoplasm of bronchus and lung: Secondary | ICD-10-CM | POA: Diagnosis not present

## 2021-12-29 DIAGNOSIS — E559 Vitamin D deficiency, unspecified: Secondary | ICD-10-CM | POA: Diagnosis not present

## 2021-12-29 DIAGNOSIS — M1990 Unspecified osteoarthritis, unspecified site: Secondary | ICD-10-CM | POA: Diagnosis not present

## 2021-12-29 DIAGNOSIS — N1832 Chronic kidney disease, stage 3b: Secondary | ICD-10-CM | POA: Diagnosis not present

## 2021-12-29 DIAGNOSIS — J45909 Unspecified asthma, uncomplicated: Secondary | ICD-10-CM | POA: Diagnosis not present

## 2021-12-31 ENCOUNTER — Other Ambulatory Visit: Payer: Self-pay | Admitting: Registered Nurse

## 2021-12-31 DIAGNOSIS — K2100 Gastro-esophageal reflux disease with esophagitis, without bleeding: Secondary | ICD-10-CM | POA: Diagnosis not present

## 2021-12-31 DIAGNOSIS — N1832 Chronic kidney disease, stage 3b: Secondary | ICD-10-CM | POA: Diagnosis not present

## 2021-12-31 DIAGNOSIS — H409 Unspecified glaucoma: Secondary | ICD-10-CM | POA: Diagnosis not present

## 2021-12-31 DIAGNOSIS — G4700 Insomnia, unspecified: Secondary | ICD-10-CM | POA: Diagnosis not present

## 2021-12-31 DIAGNOSIS — Z87891 Personal history of nicotine dependence: Secondary | ICD-10-CM | POA: Diagnosis not present

## 2021-12-31 DIAGNOSIS — Z96652 Presence of left artificial knee joint: Secondary | ICD-10-CM | POA: Diagnosis not present

## 2021-12-31 DIAGNOSIS — Z9181 History of falling: Secondary | ICD-10-CM | POA: Diagnosis not present

## 2021-12-31 DIAGNOSIS — I129 Hypertensive chronic kidney disease with stage 1 through stage 4 chronic kidney disease, or unspecified chronic kidney disease: Secondary | ICD-10-CM | POA: Diagnosis not present

## 2021-12-31 DIAGNOSIS — Z7902 Long term (current) use of antithrombotics/antiplatelets: Secondary | ICD-10-CM | POA: Diagnosis not present

## 2021-12-31 DIAGNOSIS — E781 Pure hyperglyceridemia: Secondary | ICD-10-CM | POA: Diagnosis not present

## 2021-12-31 DIAGNOSIS — J45909 Unspecified asthma, uncomplicated: Secondary | ICD-10-CM | POA: Diagnosis not present

## 2021-12-31 DIAGNOSIS — I69320 Aphasia following cerebral infarction: Secondary | ICD-10-CM | POA: Diagnosis not present

## 2021-12-31 DIAGNOSIS — M8580 Other specified disorders of bone density and structure, unspecified site: Secondary | ICD-10-CM | POA: Diagnosis not present

## 2021-12-31 DIAGNOSIS — Z7982 Long term (current) use of aspirin: Secondary | ICD-10-CM | POA: Diagnosis not present

## 2021-12-31 DIAGNOSIS — Z85118 Personal history of other malignant neoplasm of bronchus and lung: Secondary | ICD-10-CM | POA: Diagnosis not present

## 2021-12-31 DIAGNOSIS — M1990 Unspecified osteoarthritis, unspecified site: Secondary | ICD-10-CM | POA: Diagnosis not present

## 2021-12-31 DIAGNOSIS — E559 Vitamin D deficiency, unspecified: Secondary | ICD-10-CM | POA: Diagnosis not present

## 2021-12-31 DIAGNOSIS — I69351 Hemiplegia and hemiparesis following cerebral infarction affecting right dominant side: Secondary | ICD-10-CM | POA: Diagnosis not present

## 2022-01-05 DIAGNOSIS — M1990 Unspecified osteoarthritis, unspecified site: Secondary | ICD-10-CM | POA: Diagnosis not present

## 2022-01-05 DIAGNOSIS — M8580 Other specified disorders of bone density and structure, unspecified site: Secondary | ICD-10-CM | POA: Diagnosis not present

## 2022-01-05 DIAGNOSIS — I129 Hypertensive chronic kidney disease with stage 1 through stage 4 chronic kidney disease, or unspecified chronic kidney disease: Secondary | ICD-10-CM | POA: Diagnosis not present

## 2022-01-05 DIAGNOSIS — Z7982 Long term (current) use of aspirin: Secondary | ICD-10-CM | POA: Diagnosis not present

## 2022-01-05 DIAGNOSIS — E559 Vitamin D deficiency, unspecified: Secondary | ICD-10-CM | POA: Diagnosis not present

## 2022-01-05 DIAGNOSIS — G4700 Insomnia, unspecified: Secondary | ICD-10-CM | POA: Diagnosis not present

## 2022-01-05 DIAGNOSIS — Z9181 History of falling: Secondary | ICD-10-CM | POA: Diagnosis not present

## 2022-01-05 DIAGNOSIS — I69320 Aphasia following cerebral infarction: Secondary | ICD-10-CM | POA: Diagnosis not present

## 2022-01-05 DIAGNOSIS — J45909 Unspecified asthma, uncomplicated: Secondary | ICD-10-CM | POA: Diagnosis not present

## 2022-01-05 DIAGNOSIS — H409 Unspecified glaucoma: Secondary | ICD-10-CM | POA: Diagnosis not present

## 2022-01-05 DIAGNOSIS — Z85118 Personal history of other malignant neoplasm of bronchus and lung: Secondary | ICD-10-CM | POA: Diagnosis not present

## 2022-01-05 DIAGNOSIS — E781 Pure hyperglyceridemia: Secondary | ICD-10-CM | POA: Diagnosis not present

## 2022-01-05 DIAGNOSIS — K2100 Gastro-esophageal reflux disease with esophagitis, without bleeding: Secondary | ICD-10-CM | POA: Diagnosis not present

## 2022-01-05 DIAGNOSIS — N1832 Chronic kidney disease, stage 3b: Secondary | ICD-10-CM | POA: Diagnosis not present

## 2022-01-05 DIAGNOSIS — I69351 Hemiplegia and hemiparesis following cerebral infarction affecting right dominant side: Secondary | ICD-10-CM | POA: Diagnosis not present

## 2022-01-05 DIAGNOSIS — Z96652 Presence of left artificial knee joint: Secondary | ICD-10-CM | POA: Diagnosis not present

## 2022-01-05 DIAGNOSIS — Z7902 Long term (current) use of antithrombotics/antiplatelets: Secondary | ICD-10-CM | POA: Diagnosis not present

## 2022-01-05 DIAGNOSIS — Z87891 Personal history of nicotine dependence: Secondary | ICD-10-CM | POA: Diagnosis not present

## 2022-01-08 DIAGNOSIS — Z87891 Personal history of nicotine dependence: Secondary | ICD-10-CM | POA: Diagnosis not present

## 2022-01-08 DIAGNOSIS — G4700 Insomnia, unspecified: Secondary | ICD-10-CM | POA: Diagnosis not present

## 2022-01-08 DIAGNOSIS — I129 Hypertensive chronic kidney disease with stage 1 through stage 4 chronic kidney disease, or unspecified chronic kidney disease: Secondary | ICD-10-CM | POA: Diagnosis not present

## 2022-01-08 DIAGNOSIS — Z96652 Presence of left artificial knee joint: Secondary | ICD-10-CM | POA: Diagnosis not present

## 2022-01-08 DIAGNOSIS — M8580 Other specified disorders of bone density and structure, unspecified site: Secondary | ICD-10-CM | POA: Diagnosis not present

## 2022-01-08 DIAGNOSIS — Z85118 Personal history of other malignant neoplasm of bronchus and lung: Secondary | ICD-10-CM | POA: Diagnosis not present

## 2022-01-08 DIAGNOSIS — E781 Pure hyperglyceridemia: Secondary | ICD-10-CM | POA: Diagnosis not present

## 2022-01-08 DIAGNOSIS — I69320 Aphasia following cerebral infarction: Secondary | ICD-10-CM | POA: Diagnosis not present

## 2022-01-08 DIAGNOSIS — Z7902 Long term (current) use of antithrombotics/antiplatelets: Secondary | ICD-10-CM | POA: Diagnosis not present

## 2022-01-08 DIAGNOSIS — J45909 Unspecified asthma, uncomplicated: Secondary | ICD-10-CM | POA: Diagnosis not present

## 2022-01-08 DIAGNOSIS — I69351 Hemiplegia and hemiparesis following cerebral infarction affecting right dominant side: Secondary | ICD-10-CM | POA: Diagnosis not present

## 2022-01-08 DIAGNOSIS — N1832 Chronic kidney disease, stage 3b: Secondary | ICD-10-CM | POA: Diagnosis not present

## 2022-01-08 DIAGNOSIS — E559 Vitamin D deficiency, unspecified: Secondary | ICD-10-CM | POA: Diagnosis not present

## 2022-01-08 DIAGNOSIS — K2100 Gastro-esophageal reflux disease with esophagitis, without bleeding: Secondary | ICD-10-CM | POA: Diagnosis not present

## 2022-01-08 DIAGNOSIS — M1990 Unspecified osteoarthritis, unspecified site: Secondary | ICD-10-CM | POA: Diagnosis not present

## 2022-01-08 DIAGNOSIS — Z9181 History of falling: Secondary | ICD-10-CM | POA: Diagnosis not present

## 2022-01-08 DIAGNOSIS — Z7982 Long term (current) use of aspirin: Secondary | ICD-10-CM | POA: Diagnosis not present

## 2022-01-08 DIAGNOSIS — H409 Unspecified glaucoma: Secondary | ICD-10-CM | POA: Diagnosis not present

## 2022-01-09 DIAGNOSIS — I69351 Hemiplegia and hemiparesis following cerebral infarction affecting right dominant side: Secondary | ICD-10-CM | POA: Diagnosis not present

## 2022-01-09 DIAGNOSIS — M1990 Unspecified osteoarthritis, unspecified site: Secondary | ICD-10-CM | POA: Diagnosis not present

## 2022-01-09 DIAGNOSIS — H409 Unspecified glaucoma: Secondary | ICD-10-CM | POA: Diagnosis not present

## 2022-01-09 DIAGNOSIS — E559 Vitamin D deficiency, unspecified: Secondary | ICD-10-CM | POA: Diagnosis not present

## 2022-01-09 DIAGNOSIS — Z7902 Long term (current) use of antithrombotics/antiplatelets: Secondary | ICD-10-CM | POA: Diagnosis not present

## 2022-01-09 DIAGNOSIS — I129 Hypertensive chronic kidney disease with stage 1 through stage 4 chronic kidney disease, or unspecified chronic kidney disease: Secondary | ICD-10-CM | POA: Diagnosis not present

## 2022-01-09 DIAGNOSIS — Z87891 Personal history of nicotine dependence: Secondary | ICD-10-CM | POA: Diagnosis not present

## 2022-01-09 DIAGNOSIS — J45909 Unspecified asthma, uncomplicated: Secondary | ICD-10-CM | POA: Diagnosis not present

## 2022-01-09 DIAGNOSIS — Z85118 Personal history of other malignant neoplasm of bronchus and lung: Secondary | ICD-10-CM | POA: Diagnosis not present

## 2022-01-09 DIAGNOSIS — I69320 Aphasia following cerebral infarction: Secondary | ICD-10-CM | POA: Diagnosis not present

## 2022-01-09 DIAGNOSIS — Z9181 History of falling: Secondary | ICD-10-CM | POA: Diagnosis not present

## 2022-01-09 DIAGNOSIS — Z7982 Long term (current) use of aspirin: Secondary | ICD-10-CM | POA: Diagnosis not present

## 2022-01-09 DIAGNOSIS — K2100 Gastro-esophageal reflux disease with esophagitis, without bleeding: Secondary | ICD-10-CM | POA: Diagnosis not present

## 2022-01-09 DIAGNOSIS — G4700 Insomnia, unspecified: Secondary | ICD-10-CM | POA: Diagnosis not present

## 2022-01-09 DIAGNOSIS — M8580 Other specified disorders of bone density and structure, unspecified site: Secondary | ICD-10-CM | POA: Diagnosis not present

## 2022-01-09 DIAGNOSIS — E781 Pure hyperglyceridemia: Secondary | ICD-10-CM | POA: Diagnosis not present

## 2022-01-09 DIAGNOSIS — N1832 Chronic kidney disease, stage 3b: Secondary | ICD-10-CM | POA: Diagnosis not present

## 2022-01-09 DIAGNOSIS — Z96652 Presence of left artificial knee joint: Secondary | ICD-10-CM | POA: Diagnosis not present

## 2022-01-13 DIAGNOSIS — E559 Vitamin D deficiency, unspecified: Secondary | ICD-10-CM | POA: Diagnosis not present

## 2022-01-13 DIAGNOSIS — E781 Pure hyperglyceridemia: Secondary | ICD-10-CM | POA: Diagnosis not present

## 2022-01-13 DIAGNOSIS — Z9181 History of falling: Secondary | ICD-10-CM | POA: Diagnosis not present

## 2022-01-13 DIAGNOSIS — K2100 Gastro-esophageal reflux disease with esophagitis, without bleeding: Secondary | ICD-10-CM | POA: Diagnosis not present

## 2022-01-13 DIAGNOSIS — I69351 Hemiplegia and hemiparesis following cerebral infarction affecting right dominant side: Secondary | ICD-10-CM | POA: Diagnosis not present

## 2022-01-13 DIAGNOSIS — I129 Hypertensive chronic kidney disease with stage 1 through stage 4 chronic kidney disease, or unspecified chronic kidney disease: Secondary | ICD-10-CM | POA: Diagnosis not present

## 2022-01-13 DIAGNOSIS — N1832 Chronic kidney disease, stage 3b: Secondary | ICD-10-CM | POA: Diagnosis not present

## 2022-01-13 DIAGNOSIS — Z85118 Personal history of other malignant neoplasm of bronchus and lung: Secondary | ICD-10-CM | POA: Diagnosis not present

## 2022-01-13 DIAGNOSIS — M8580 Other specified disorders of bone density and structure, unspecified site: Secondary | ICD-10-CM | POA: Diagnosis not present

## 2022-01-13 DIAGNOSIS — M1990 Unspecified osteoarthritis, unspecified site: Secondary | ICD-10-CM | POA: Diagnosis not present

## 2022-01-13 DIAGNOSIS — Z96652 Presence of left artificial knee joint: Secondary | ICD-10-CM | POA: Diagnosis not present

## 2022-01-13 DIAGNOSIS — I69320 Aphasia following cerebral infarction: Secondary | ICD-10-CM | POA: Diagnosis not present

## 2022-01-13 DIAGNOSIS — Z7982 Long term (current) use of aspirin: Secondary | ICD-10-CM | POA: Diagnosis not present

## 2022-01-13 DIAGNOSIS — G4700 Insomnia, unspecified: Secondary | ICD-10-CM | POA: Diagnosis not present

## 2022-01-13 DIAGNOSIS — Z7902 Long term (current) use of antithrombotics/antiplatelets: Secondary | ICD-10-CM | POA: Diagnosis not present

## 2022-01-13 DIAGNOSIS — H409 Unspecified glaucoma: Secondary | ICD-10-CM | POA: Diagnosis not present

## 2022-01-13 DIAGNOSIS — Z87891 Personal history of nicotine dependence: Secondary | ICD-10-CM | POA: Diagnosis not present

## 2022-01-13 DIAGNOSIS — J45909 Unspecified asthma, uncomplicated: Secondary | ICD-10-CM | POA: Diagnosis not present

## 2022-01-16 DIAGNOSIS — N1832 Chronic kidney disease, stage 3b: Secondary | ICD-10-CM | POA: Diagnosis not present

## 2022-01-16 DIAGNOSIS — Z96652 Presence of left artificial knee joint: Secondary | ICD-10-CM | POA: Diagnosis not present

## 2022-01-16 DIAGNOSIS — K2100 Gastro-esophageal reflux disease with esophagitis, without bleeding: Secondary | ICD-10-CM | POA: Diagnosis not present

## 2022-01-16 DIAGNOSIS — E781 Pure hyperglyceridemia: Secondary | ICD-10-CM | POA: Diagnosis not present

## 2022-01-16 DIAGNOSIS — H409 Unspecified glaucoma: Secondary | ICD-10-CM | POA: Diagnosis not present

## 2022-01-16 DIAGNOSIS — Z85118 Personal history of other malignant neoplasm of bronchus and lung: Secondary | ICD-10-CM | POA: Diagnosis not present

## 2022-01-16 DIAGNOSIS — M8580 Other specified disorders of bone density and structure, unspecified site: Secondary | ICD-10-CM | POA: Diagnosis not present

## 2022-01-16 DIAGNOSIS — Z7902 Long term (current) use of antithrombotics/antiplatelets: Secondary | ICD-10-CM | POA: Diagnosis not present

## 2022-01-16 DIAGNOSIS — M1990 Unspecified osteoarthritis, unspecified site: Secondary | ICD-10-CM | POA: Diagnosis not present

## 2022-01-16 DIAGNOSIS — Z9181 History of falling: Secondary | ICD-10-CM | POA: Diagnosis not present

## 2022-01-16 DIAGNOSIS — G4700 Insomnia, unspecified: Secondary | ICD-10-CM | POA: Diagnosis not present

## 2022-01-16 DIAGNOSIS — I69351 Hemiplegia and hemiparesis following cerebral infarction affecting right dominant side: Secondary | ICD-10-CM | POA: Diagnosis not present

## 2022-01-16 DIAGNOSIS — I129 Hypertensive chronic kidney disease with stage 1 through stage 4 chronic kidney disease, or unspecified chronic kidney disease: Secondary | ICD-10-CM | POA: Diagnosis not present

## 2022-01-16 DIAGNOSIS — E559 Vitamin D deficiency, unspecified: Secondary | ICD-10-CM | POA: Diagnosis not present

## 2022-01-16 DIAGNOSIS — I69320 Aphasia following cerebral infarction: Secondary | ICD-10-CM | POA: Diagnosis not present

## 2022-01-16 DIAGNOSIS — J45909 Unspecified asthma, uncomplicated: Secondary | ICD-10-CM | POA: Diagnosis not present

## 2022-01-16 DIAGNOSIS — Z7982 Long term (current) use of aspirin: Secondary | ICD-10-CM | POA: Diagnosis not present

## 2022-01-16 DIAGNOSIS — Z87891 Personal history of nicotine dependence: Secondary | ICD-10-CM | POA: Diagnosis not present

## 2022-01-19 DIAGNOSIS — E559 Vitamin D deficiency, unspecified: Secondary | ICD-10-CM | POA: Diagnosis not present

## 2022-01-19 DIAGNOSIS — Z9181 History of falling: Secondary | ICD-10-CM | POA: Diagnosis not present

## 2022-01-19 DIAGNOSIS — I69351 Hemiplegia and hemiparesis following cerebral infarction affecting right dominant side: Secondary | ICD-10-CM | POA: Diagnosis not present

## 2022-01-19 DIAGNOSIS — M8580 Other specified disorders of bone density and structure, unspecified site: Secondary | ICD-10-CM | POA: Diagnosis not present

## 2022-01-19 DIAGNOSIS — Z87891 Personal history of nicotine dependence: Secondary | ICD-10-CM | POA: Diagnosis not present

## 2022-01-19 DIAGNOSIS — I69320 Aphasia following cerebral infarction: Secondary | ICD-10-CM | POA: Diagnosis not present

## 2022-01-19 DIAGNOSIS — M1990 Unspecified osteoarthritis, unspecified site: Secondary | ICD-10-CM | POA: Diagnosis not present

## 2022-01-19 DIAGNOSIS — Z85118 Personal history of other malignant neoplasm of bronchus and lung: Secondary | ICD-10-CM | POA: Diagnosis not present

## 2022-01-19 DIAGNOSIS — Z7982 Long term (current) use of aspirin: Secondary | ICD-10-CM | POA: Diagnosis not present

## 2022-01-19 DIAGNOSIS — E781 Pure hyperglyceridemia: Secondary | ICD-10-CM | POA: Diagnosis not present

## 2022-01-19 DIAGNOSIS — Z7902 Long term (current) use of antithrombotics/antiplatelets: Secondary | ICD-10-CM | POA: Diagnosis not present

## 2022-01-19 DIAGNOSIS — I129 Hypertensive chronic kidney disease with stage 1 through stage 4 chronic kidney disease, or unspecified chronic kidney disease: Secondary | ICD-10-CM | POA: Diagnosis not present

## 2022-01-19 DIAGNOSIS — H409 Unspecified glaucoma: Secondary | ICD-10-CM | POA: Diagnosis not present

## 2022-01-19 DIAGNOSIS — K2100 Gastro-esophageal reflux disease with esophagitis, without bleeding: Secondary | ICD-10-CM | POA: Diagnosis not present

## 2022-01-19 DIAGNOSIS — Z96652 Presence of left artificial knee joint: Secondary | ICD-10-CM | POA: Diagnosis not present

## 2022-01-19 DIAGNOSIS — G4700 Insomnia, unspecified: Secondary | ICD-10-CM | POA: Diagnosis not present

## 2022-01-19 DIAGNOSIS — J45909 Unspecified asthma, uncomplicated: Secondary | ICD-10-CM | POA: Diagnosis not present

## 2022-01-19 DIAGNOSIS — N1832 Chronic kidney disease, stage 3b: Secondary | ICD-10-CM | POA: Diagnosis not present

## 2022-01-20 ENCOUNTER — Encounter (INDEPENDENT_AMBULATORY_CARE_PROVIDER_SITE_OTHER): Payer: Medicare Other | Admitting: Ophthalmology

## 2022-01-20 DIAGNOSIS — H43813 Vitreous degeneration, bilateral: Secondary | ICD-10-CM

## 2022-01-20 DIAGNOSIS — H35033 Hypertensive retinopathy, bilateral: Secondary | ICD-10-CM

## 2022-01-20 DIAGNOSIS — H353132 Nonexudative age-related macular degeneration, bilateral, intermediate dry stage: Secondary | ICD-10-CM | POA: Diagnosis not present

## 2022-01-20 DIAGNOSIS — I10 Essential (primary) hypertension: Secondary | ICD-10-CM

## 2022-01-20 DIAGNOSIS — H34831 Tributary (branch) retinal vein occlusion, right eye, with macular edema: Secondary | ICD-10-CM | POA: Diagnosis not present

## 2022-01-23 ENCOUNTER — Telehealth: Payer: Self-pay

## 2022-01-23 DIAGNOSIS — J45909 Unspecified asthma, uncomplicated: Secondary | ICD-10-CM | POA: Diagnosis not present

## 2022-01-23 DIAGNOSIS — E559 Vitamin D deficiency, unspecified: Secondary | ICD-10-CM | POA: Diagnosis not present

## 2022-01-23 DIAGNOSIS — I69320 Aphasia following cerebral infarction: Secondary | ICD-10-CM | POA: Diagnosis not present

## 2022-01-23 DIAGNOSIS — K2100 Gastro-esophageal reflux disease with esophagitis, without bleeding: Secondary | ICD-10-CM | POA: Diagnosis not present

## 2022-01-23 DIAGNOSIS — M8580 Other specified disorders of bone density and structure, unspecified site: Secondary | ICD-10-CM | POA: Diagnosis not present

## 2022-01-23 DIAGNOSIS — Z7982 Long term (current) use of aspirin: Secondary | ICD-10-CM | POA: Diagnosis not present

## 2022-01-23 DIAGNOSIS — I69351 Hemiplegia and hemiparesis following cerebral infarction affecting right dominant side: Secondary | ICD-10-CM | POA: Diagnosis not present

## 2022-01-23 DIAGNOSIS — Z85118 Personal history of other malignant neoplasm of bronchus and lung: Secondary | ICD-10-CM | POA: Diagnosis not present

## 2022-01-23 DIAGNOSIS — Z7902 Long term (current) use of antithrombotics/antiplatelets: Secondary | ICD-10-CM | POA: Diagnosis not present

## 2022-01-23 DIAGNOSIS — M1990 Unspecified osteoarthritis, unspecified site: Secondary | ICD-10-CM | POA: Diagnosis not present

## 2022-01-23 DIAGNOSIS — Z9181 History of falling: Secondary | ICD-10-CM | POA: Diagnosis not present

## 2022-01-23 DIAGNOSIS — N1832 Chronic kidney disease, stage 3b: Secondary | ICD-10-CM | POA: Diagnosis not present

## 2022-01-23 DIAGNOSIS — Z87891 Personal history of nicotine dependence: Secondary | ICD-10-CM | POA: Diagnosis not present

## 2022-01-23 DIAGNOSIS — I129 Hypertensive chronic kidney disease with stage 1 through stage 4 chronic kidney disease, or unspecified chronic kidney disease: Secondary | ICD-10-CM | POA: Diagnosis not present

## 2022-01-23 DIAGNOSIS — Z96652 Presence of left artificial knee joint: Secondary | ICD-10-CM | POA: Diagnosis not present

## 2022-01-23 DIAGNOSIS — E781 Pure hyperglyceridemia: Secondary | ICD-10-CM | POA: Diagnosis not present

## 2022-01-23 DIAGNOSIS — G4700 Insomnia, unspecified: Secondary | ICD-10-CM | POA: Diagnosis not present

## 2022-01-23 DIAGNOSIS — H409 Unspecified glaucoma: Secondary | ICD-10-CM | POA: Diagnosis not present

## 2022-01-23 NOTE — Telephone Encounter (Signed)
Lorenso Courier (daughter) has some concerns about Mrs. Aydelotte release to drive date. Patient still has some attention issues.  Lorenso Courier has been informed that Dr. Dagoberto Ligas will access the patient at the next visit. She is welcome to come to voice her concerns.   Call back phone  [phone].

## 2022-01-26 DIAGNOSIS — K2100 Gastro-esophageal reflux disease with esophagitis, without bleeding: Secondary | ICD-10-CM | POA: Diagnosis not present

## 2022-01-26 DIAGNOSIS — J45909 Unspecified asthma, uncomplicated: Secondary | ICD-10-CM | POA: Diagnosis not present

## 2022-01-26 DIAGNOSIS — Z9181 History of falling: Secondary | ICD-10-CM | POA: Diagnosis not present

## 2022-01-26 DIAGNOSIS — M1990 Unspecified osteoarthritis, unspecified site: Secondary | ICD-10-CM | POA: Diagnosis not present

## 2022-01-26 DIAGNOSIS — H409 Unspecified glaucoma: Secondary | ICD-10-CM | POA: Diagnosis not present

## 2022-01-26 DIAGNOSIS — Z7982 Long term (current) use of aspirin: Secondary | ICD-10-CM | POA: Diagnosis not present

## 2022-01-26 DIAGNOSIS — I69320 Aphasia following cerebral infarction: Secondary | ICD-10-CM | POA: Diagnosis not present

## 2022-01-26 DIAGNOSIS — G4700 Insomnia, unspecified: Secondary | ICD-10-CM | POA: Diagnosis not present

## 2022-01-26 DIAGNOSIS — N1832 Chronic kidney disease, stage 3b: Secondary | ICD-10-CM | POA: Diagnosis not present

## 2022-01-26 DIAGNOSIS — Z96652 Presence of left artificial knee joint: Secondary | ICD-10-CM | POA: Diagnosis not present

## 2022-01-26 DIAGNOSIS — M8580 Other specified disorders of bone density and structure, unspecified site: Secondary | ICD-10-CM | POA: Diagnosis not present

## 2022-01-26 DIAGNOSIS — Z85118 Personal history of other malignant neoplasm of bronchus and lung: Secondary | ICD-10-CM | POA: Diagnosis not present

## 2022-01-26 DIAGNOSIS — E781 Pure hyperglyceridemia: Secondary | ICD-10-CM | POA: Diagnosis not present

## 2022-01-26 DIAGNOSIS — Z87891 Personal history of nicotine dependence: Secondary | ICD-10-CM | POA: Diagnosis not present

## 2022-01-26 DIAGNOSIS — I69351 Hemiplegia and hemiparesis following cerebral infarction affecting right dominant side: Secondary | ICD-10-CM | POA: Diagnosis not present

## 2022-01-26 DIAGNOSIS — I129 Hypertensive chronic kidney disease with stage 1 through stage 4 chronic kidney disease, or unspecified chronic kidney disease: Secondary | ICD-10-CM | POA: Diagnosis not present

## 2022-01-26 DIAGNOSIS — Z7902 Long term (current) use of antithrombotics/antiplatelets: Secondary | ICD-10-CM | POA: Diagnosis not present

## 2022-01-26 DIAGNOSIS — E559 Vitamin D deficiency, unspecified: Secondary | ICD-10-CM | POA: Diagnosis not present

## 2022-01-27 DIAGNOSIS — J45909 Unspecified asthma, uncomplicated: Secondary | ICD-10-CM | POA: Diagnosis not present

## 2022-01-27 DIAGNOSIS — E781 Pure hyperglyceridemia: Secondary | ICD-10-CM | POA: Diagnosis not present

## 2022-01-27 DIAGNOSIS — M1990 Unspecified osteoarthritis, unspecified site: Secondary | ICD-10-CM | POA: Diagnosis not present

## 2022-01-27 DIAGNOSIS — Z9181 History of falling: Secondary | ICD-10-CM | POA: Diagnosis not present

## 2022-01-27 DIAGNOSIS — Z87891 Personal history of nicotine dependence: Secondary | ICD-10-CM | POA: Diagnosis not present

## 2022-01-27 DIAGNOSIS — Z7982 Long term (current) use of aspirin: Secondary | ICD-10-CM | POA: Diagnosis not present

## 2022-01-27 DIAGNOSIS — K2100 Gastro-esophageal reflux disease with esophagitis, without bleeding: Secondary | ICD-10-CM | POA: Diagnosis not present

## 2022-01-27 DIAGNOSIS — M8580 Other specified disorders of bone density and structure, unspecified site: Secondary | ICD-10-CM | POA: Diagnosis not present

## 2022-01-27 DIAGNOSIS — N1832 Chronic kidney disease, stage 3b: Secondary | ICD-10-CM | POA: Diagnosis not present

## 2022-01-27 DIAGNOSIS — Z85118 Personal history of other malignant neoplasm of bronchus and lung: Secondary | ICD-10-CM | POA: Diagnosis not present

## 2022-01-27 DIAGNOSIS — I69320 Aphasia following cerebral infarction: Secondary | ICD-10-CM | POA: Diagnosis not present

## 2022-01-27 DIAGNOSIS — I129 Hypertensive chronic kidney disease with stage 1 through stage 4 chronic kidney disease, or unspecified chronic kidney disease: Secondary | ICD-10-CM | POA: Diagnosis not present

## 2022-01-27 DIAGNOSIS — H409 Unspecified glaucoma: Secondary | ICD-10-CM | POA: Diagnosis not present

## 2022-01-27 DIAGNOSIS — I69351 Hemiplegia and hemiparesis following cerebral infarction affecting right dominant side: Secondary | ICD-10-CM | POA: Diagnosis not present

## 2022-01-27 DIAGNOSIS — Z7902 Long term (current) use of antithrombotics/antiplatelets: Secondary | ICD-10-CM | POA: Diagnosis not present

## 2022-01-27 DIAGNOSIS — G4700 Insomnia, unspecified: Secondary | ICD-10-CM | POA: Diagnosis not present

## 2022-01-27 DIAGNOSIS — Z96652 Presence of left artificial knee joint: Secondary | ICD-10-CM | POA: Diagnosis not present

## 2022-01-27 DIAGNOSIS — E559 Vitamin D deficiency, unspecified: Secondary | ICD-10-CM | POA: Diagnosis not present

## 2022-01-28 ENCOUNTER — Encounter: Payer: Medicare Other | Attending: Registered Nurse | Admitting: Physical Medicine and Rehabilitation

## 2022-01-28 ENCOUNTER — Encounter: Payer: Self-pay | Admitting: Physical Medicine and Rehabilitation

## 2022-01-28 VITALS — BP 129/76 | HR 77 | Ht 62.0 in | Wt 171.0 lb

## 2022-01-28 DIAGNOSIS — I63512 Cerebral infarction due to unspecified occlusion or stenosis of left middle cerebral artery: Secondary | ICD-10-CM | POA: Insufficient documentation

## 2022-01-28 DIAGNOSIS — M6281 Muscle weakness (generalized): Secondary | ICD-10-CM | POA: Diagnosis not present

## 2022-01-28 DIAGNOSIS — I69320 Aphasia following cerebral infarction: Secondary | ICD-10-CM | POA: Diagnosis not present

## 2022-01-28 DIAGNOSIS — R269 Unspecified abnormalities of gait and mobility: Secondary | ICD-10-CM | POA: Diagnosis not present

## 2022-01-28 DIAGNOSIS — G8191 Hemiplegia, unspecified affecting right dominant side: Secondary | ICD-10-CM | POA: Insufficient documentation

## 2022-01-28 NOTE — Patient Instructions (Signed)
Pt is an 81 yr old female with hx of L MCA stroke in 3/23, HTN, CKD3a; hx of lung CA stage I; Asthma; anxiety with depression, hyperactive bladder; Glaucoma. Here for hospital f/u for L MCA stroke.  Hx of L TKR  Cannot drive yet- will not be able to drive til a minimum is done with outpt therapies. Will need to speak with OT and Speech about this.   2.  Referral to oupt PT, OT and Speech therapy.   Is finishing H/H and ready for outpatient.   3. I agree with Ortho looking at R foot and seeing if there's anything that needs to be done.    4. Short term memory is expected with her type of stroke.    5. Needs A with dressing/bra and bathing- as well as some help to go to therapy/errands; can make her Keurig/simple prep, but not cook.  Needs A with meds- CNA's are not allowed to do meds.   6. Has life alert pendent- suggest reduction-   7. Discussed prognosis and what to do if has another stroke. Needs to think about what she's going to do.    8.  F/U in 3 months

## 2022-01-28 NOTE — Progress Notes (Signed)
Subjective:    Patient ID: [name], female    DOB: 08/18/1940, 81 y.o.   MRN: [id number]  HPI Pt is an 81 yr old female with hx of L MCA stroke in 3/23, HTN, CKD3a; hx of lung CA stage I; Asthma; anxiety with depression, hyperactive bladder; Glaucoma. Here for hospital f/u for L MCA stroke.    Things going slowly. But progressing.   Still in therapy H/H- ending next week.  Trouble with fine motor dexterity and needs A for bathing/dressing Still having word finding issues.   Hasn't used IPAD- lately since dexterity- but gets email/text on it and hasn't checked since dexterity.   R foot still hurts-  Has appt with Ortho tomorrow who did R knee surgery.  R foot dragging some- pain really getting to her.   Caretakers 8:30-3:30 and then from 3:30 to 10.30- in bed by 9pm.  Has a maltese   Pain Inventory Average Pain 5 Pain Right Now 5 My pain is constant and tingling  LOCATION OF PAIN  Right lower leg and right foot  BOWEL Number of stools per week: 7  BLADDER Pads taking Vesicare    Mobility walk with assistance use a walker how many minutes can you walk? unknown ability to climb steps?  yes do you drive?  no needs help with transfers Do you have any goals in this area?  yes  Function retired I need assistance with the following:  bathing, meal prep, household duties, and shopping Do you have any goals in this area?  yes  Neuro/Psych trouble walking  Prior Studies Any changes since last visit?  no  Physicians involved in your care Any changes since last visit?  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  Tobacco Use   Smoking status: Former    Packs/day: 0.75    Years: 20.00    Total pack years: 15.00    Types: Cigarettes    Quit date: 1980    Years since quitting: 43.5   Smokeless tobacco: Never  Vaping Use   Vaping Use: Never used  Substance and Sexual Activity   Alcohol use: Yes    Alcohol/week: 1.0 standard drink of alcohol    Types: 1 Glasses of wine per week    Comment: occasional   Drug use: No   Sexual activity: Not on file  Other Topics Concern   Not on file  Social History Narrative   ** Merged History Encounter **       Social Determinants of Health   Financial Resource Strain: Not on file  Food Insecurity: Not on file  Transportation Needs: Not on file  Physical Activity: Not on file  Stress: Not on file  Social Connections: Not on file   Past Surgical History:  Procedure Laterality Date   APPENDECTOMY     COLONOSCOPY     EYE SURGERY Left    cataract  removal   ORIF WRIST FRACTURE Left 08/04/2013   Procedure: OPEN REDUCTION INTERNAL FIXATION (ORIF) LEFT DISTAL RADIUS WRIST FRACTURE;  Surgeon: Wynonia Sours, MD;  Location: [place];  Service: Orthopedics;  Laterality: Left;   PARTIAL HYSTERECTOMY     TOTAL KNEE ARTHROPLASTY Left 08/26/2018   Procedure: TOTAL KNEE ARTHROPLASTY;  Surgeon: Netta Cedars, MD;  Location: [facility];  Service: Orthopedics;  Laterality: Left;   VIDEO ASSISTED THORACOSCOPY (VATS)/WEDGE RESECTION Right 02/12/2014   Procedure: RIGHT VIDEO ASSISTED THORACOSCOPY RIGHT LOWER LOBE LUNG /WEDGE RESECTION,RIGHT THORACOTOMY WITH RIGHT LOWER LOBE LOBECTOMY & NODE DISSECTION;  Surgeon: Melrose Nakayama, MD;  Location: [place];  Service: Thoracic;  Laterality: Right;   VIDEO ASSISTED THORACOSCOPY (VATS)/WEDGE RESECTION Right 02/12/2014   VIDEO BRONCHOSCOPY N/A 02/12/2014   Procedure: VIDEO BRONCHOSCOPY;  Surgeon: Melrose Nakayama, MD;  Location: [place];  Service:  Thoracic;  Laterality: N/A;   Past Medical History:  Diagnosis Date    Multiple pulmonary nodules, largest R mid lung 06/29/2013   Followed in Pulmonary clinic/ [facility]/ [place] - see CXR 06/27/13  - CT chest 07/14/2013 > 1. No acute findings in the thorax to account for the patient's  symptoms.  2. However, there is a subsolid nodule in the   right lower lobe that has a ground-glass attenuation component  measuring 2.5 x 1.8 cm, and a small central solid component  measuring 4 mm. Initial follow-up by chest CT without    Adenocarcinoma of lung, stage 1 ([place])    Status post right lower lobectomy August 2015   Anxiety    Asthma    Atrophic vaginitis    Chronic cough 12/20/2018   Chronic iritis of left eye    Pupil stays dilated; nonreactive   Chronic kidney disease, stage 3b (HCC)    Colon polyp    Contact lens/glasses fitting    wears contacts or glasses   Cough variant asthma with component of uacs  05/31/2013   Followed in Pulmonary clinic/ [facility]/ [place] in her 34's - Spirometry 02/23/13 wnl  - 07/03/2013 Sinus CT > Mild chronic sinus disease - No acute findings. Left-to-right nasal septal deviation of 3 mm. -med calendar 08/08/13 , redone 06/01/2016 and 02/22/2017  - Eos 4%   10/13/2013  > rec singulair daily  - FENO 05/12/2016  =   24 on singulair  - Allergy profile 05/12/2016 >   IgE  47 pos   Depression    Depression with anxiety    Dysuria 08/03/2019   Environmental allergies    Essential hypertension 02/23/2017   Changed losartan to avapro 02/22/2017 due to cough    GERD (gastroesophageal reflux disease)    Hypertension    Hypertriglyceridemia    Kidney stones    OA (osteoarthritis)    Osteopenia    PONV (postoperative nausea and vomiting)    S/P lobectomy of lung 02/12/2014   Status post total knee replacement, left 08/26/2018   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   Ht 5\' 2"  (1.575 m)   Wt 171 lb (77.6 kg)   BMI 31.28 kg/m   Opioid Risk Score:   Fall  Risk Score:  `1  Depression screen [facility] 2/9     10/22/2021    3:18 PM  Depression screen PHQ 2/9  Decreased Interest 0  Down, Depressed, Hopeless 0  PHQ - 2 Score 0  Altered sleeping 0  Tired, decreased energy 1  Change in appetite 0  Feeling bad  or failure about yourself  0  Trouble concentrating 3  Moving slowly or fidgety/restless 2  Suicidal thoughts 0  PHQ-9 Score 6  Difficult doing work/chores Very difficult    Review of Systems  Eyes:  Positive for visual disturbance.  Musculoskeletal:  Positive for gait problem.       Balance issue  Neurological:  Positive for speech difficulty.       Tingling in right lower leg & foot  All other systems reviewed and are negative.      Objective:   Physical Exam  Awake, alert, accompanied by daughter, HOH- doesn't have hearing aids; using RW to get around, NAD Still slowed processing Still word finding deficits at sentence level.   MS: RUE 5-/5 except deltoid is 4+/5 LUE 5/5 RLE- HF 4+/5; KE/KF 4+/5; DF and PF 5-/5 LLE- 5-/5 throughout  Very mild R foot drag Gait 4 point turn to turn around Walking pretty well with RW    Assessment & Plan:   Pt is an 81 yr old female with hx of L MCA stroke in 3/23, HTN, CKD3a; hx of lung CA stage I; Asthma; anxiety with depression, hyperactive bladder; Glaucoma. Here for hospital f/u for L MCA stroke.  Hx of L TKR  Cannot drive yet- will not be able to drive til a minimum is done with outpt therapies. Will need to speak with OT and Speech about this.   2.  Referral to oupt PT, OT and Speech therapy.   Is finishing H/H and ready for outpatient.   3. I agree with Ortho looking at R foot and seeing if there's anything that needs to be done.    4. Short term memory is expected with her type of stroke.    5. Needs A with dressing/bra and bathing- as well as some help to go to therapy/errands; can make her Keurig/simple prep, but not cook.  Needs A with meds- CNA's are not allowed  to do meds.   6. Has life alert pendent- suggest reduction-   7. Discussed prognosis and what to do if has another stroke. Needs to think about what she's going to do.    8.  F/U in 3 months   I spent a total of 41  minutes on total care today- >50% coordination of care- due to discussion about long term planning.

## 2022-01-29 DIAGNOSIS — S93401D Sprain of unspecified ligament of right ankle, subsequent encounter: Secondary | ICD-10-CM | POA: Diagnosis not present

## 2022-01-29 DIAGNOSIS — M79671 Pain in right foot: Secondary | ICD-10-CM | POA: Diagnosis not present

## 2022-01-30 NOTE — Therapy (Signed)
OUTPATIENT SPEECH LANGUAGE PATHOLOGY APHASIA EVALUATION   Patient Name: [name] MRN: [id number] DOB:03-08-1941, 81 y.o., female Today's Date: 02/03/2022  PCP: none on file REFERRING PROVIDER: Courtney Heys, MD   End of Session - 02/03/22 1400     Visit Number 1    Number of Visits 17    Date for SLP Re-Evaluation 03/31/22    Progress Note Due on Visit 10    SLP Start Time 1015    SLP Stop Time  1102    SLP Time Calculation (min) 47 min    Activity Tolerance Patient tolerated treatment well             Past Medical History:  Diagnosis Date    Multiple pulmonary nodules, largest R mid lung 06/29/2013   Followed in Pulmonary clinic/ [facility]/ [place] - see CXR 06/27/13  - CT chest 07/14/2013 > 1. No acute findings in the thorax to account for the patient's  symptoms.  2. However, there is a subsolid nodule in the   right lower lobe that has a ground-glass attenuation component  measuring 2.5 x 1.8 cm, and a small central solid component  measuring 4 mm. Initial follow-up by chest CT without    Adenocarcinoma of lung, stage 1 ([place])    Status post right lower lobectomy August 2015   Anxiety    Asthma    Atrophic vaginitis    Chronic cough 12/20/2018   Chronic iritis of left eye    Pupil stays dilated; nonreactive   Chronic kidney disease, stage 3b (HCC)    Colon polyp    Contact lens/glasses fitting    wears contacts or glasses   Cough variant asthma with component of uacs  05/31/2013   Followed in Pulmonary clinic/ [facility]/ [facility] in her 30's - Spirometry 02/23/13 wnl  - 07/03/2013 Sinus CT > Mild chronic sinus disease - No acute findings. Left-to-right nasal septal deviation of 3 mm. -med calendar 08/08/13 , redone 06/01/2016 and 02/22/2017  - Eos 4%   10/13/2013  > rec singulair daily  - FENO 05/12/2016  =   24 on singulair  - Allergy profile 05/12/2016 >   IgE  47 pos   Depression    Depression with anxiety    Dysuria 08/03/2019   Environmental  allergies    Essential hypertension 02/23/2017   Changed losartan to avapro 02/22/2017 due to cough    GERD (gastroesophageal reflux disease)    Hypertension    Hypertriglyceridemia    Kidney stones    OA (osteoarthritis)    Osteopenia    PONV (postoperative nausea and vomiting)    S/P lobectomy of lung 02/12/2014   Status post total knee replacement, left 08/26/2018   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   Past Surgical History:  Procedure Laterality Date   APPENDECTOMY     COLONOSCOPY     EYE SURGERY Left    cataract removal   ORIF WRIST FRACTURE Left 08/04/2013   Procedure: OPEN REDUCTION INTERNAL FIXATION (ORIF) LEFT DISTAL RADIUS WRIST FRACTURE;  Surgeon: Wynonia Sours, MD;  Location: [place];  Service: Orthopedics;  Laterality: Left;   PARTIAL HYSTERECTOMY     TOTAL KNEE ARTHROPLASTY Left 08/26/2018   Procedure: TOTAL KNEE ARTHROPLASTY;  Surgeon: Netta Cedars, MD;  [facility];  Service: Orthopedics;  Laterality: Left;   VIDEO ASSISTED THORACOSCOPY (VATS)/WEDGE RESECTION Right 02/12/2014   Procedure: RIGHT VIDEO ASSISTED THORACOSCOPY RIGHT LOWER LOBE LUNG /WEDGE RESECTION,RIGHT THORACOTOMY WITH RIGHT  LOWER LOBE LOBECTOMY & NODE DISSECTION;  Surgeon: Melrose Nakayama, MD;  Location: [place];  Service: Thoracic;  Laterality: Right;   VIDEO ASSISTED THORACOSCOPY (VATS)/WEDGE RESECTION Right 02/12/2014   VIDEO BRONCHOSCOPY N/A 02/12/2014   Procedure: VIDEO BRONCHOSCOPY;  Surgeon: Melrose Nakayama, MD;  Location: [place];  Service: Thoracic;  Laterality: N/A;   Patient Active Problem List   Diagnosis Date Noted   Acute ischemic left MCA stroke ([place]) 01/28/2022   Aphasia as late effect of cerebrovascular accident (CVA) 01/28/2022   Right hemiparesis ([place]) 01/28/2022   Abnormal gait due to muscle weakness 01/28/2022   Insomnia 10/13/2021   Frequency of urination 10/13/2021   Vitamin D deficiency 10/13/2021   Chronic renal failure 10/13/2021   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[place]) 09/24/2021   Aphasia due to acute cerebrovascular accident (CVA) ([place]) 09/21/2021   Chronic kidney disease, stage 3b ([place]) 09/21/2021   Depression with anxiety 09/21/2021   Hypertension 09/21/2021   Asthma, chronic 09/21/2021   Fall at home, initial encounter 09/21/2021   Right ankle pain 09/21/2021   Dysuria 08/03/2019   Chronic cough 12/20/2018   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   Status post total knee replacement, left 08/26/2018   Essential hypertension 02/23/2017   Adenocarcinoma of lung, stage 1 ([place]) 05/08/2014   S/P lobectomy of lung 02/12/2014   Asthma 01/30/2014    Multiple pulmonary nodules, largest R mid lung 06/29/2013   Cough variant asthma with component of uacs  05/31/2013    ONSET DATE: March 2023, referral July 2023   REFERRING DIAG:  I69.320 (ICD-10-CM) - Aphasia as late effect of cerebrovascular accident (CVA)  I63.512 (ICD-10-CM) - Acute ischemic left MCA stroke (HCC)    THERAPY DIAG:  Aphasia  Cognitive communication deficit  Apraxia following nontraumatic intracerebral hemorrhage  Rationale for Evaluation and Treatment Rehabilitation  SUBJECTIVE:   SUBJECTIVE STATEMENT: "I had home health" Pt accompanied by: self  PERTINENT HISTORY: Pt is an 81 yr old female with hx of L MCA stroke in 3/23, HTN, CKD3a; hx of lung CA stage I; Asthma; anxiety with depression, hyperactive bladder; Glaucoma.  PAIN:  Are you having pain? No  FALLS: Has patient fallen in last 6 months?  See PT evaluation for details  LIVING ENVIRONMENT: Lives with: lives alone Lives in: House/apartment  PLOF:  Level of assistance: Independent with IADLs Employment: Retired   PATIENT GOALS "to be normal"   OBJECTIVE:   DIAGNOSTIC FINDINGS: 09/21/21 MR BRAIN WO CONTRAST IMPRESSION: 1. Early subacute infarct of the left MCA territory involving the left parietal and temporal lobes. No hemorrhage or mass effect. 2.  Old infarcts of the left basal ganglia and both cerebellar hemispheres.  COGNITION: Overall cognitive status: Difficulty to assess due to: Communication impairment and no family present Functional deficits: currently has x2 aids. Unable to report deficits 2/2 communication impairment.   AUDITORY COMPREHENSION: Overall auditory comprehension: Impaired: moderately complex YES/NO questions: Impaired: complex Following directions: Appears intact Conversation: Complex Interfering components: hearing Effective technique: extra processing time and repetition/stressing words   READING COMPREHENSION: Impaired: sentence and paragraph  EXPRESSION: verbal  VERBAL EXPRESSION: Level of generative/spontaneous verbalization: phrase and sentence Automatic speech: month of year: intact  Repetition: Impaired: sentence Naming: Responsive: 51-75% Over 50 trials, pt use of semantic paraphasia in x13 responses. Does not demonstrate awareness.  Pragmatics: Appears intact Comments: Pt will reduced awareness and limited abilitiy to express difficulties. Tells ST "I don't know, can you tell me" Interfering components:  awareness Effective technique: semantic cues, sentence completion, phonemic cues, and articulatory cues Non-verbal means of communication: gestures  WRITTEN EXPRESSION: Dominant hand: right  Written expression: Impaired: phrase Semantic paraphasia on 2nd word in self-generated sentence, unable to continue. Does demonstrates awareness of difficulties in this area.   MOTOR SPEECH: Overall motor speech: impaired Level of impairment: Word, Phrase, and Sentence Respiration: thoracic breathing Phonation: normal Resonance: WFL Articulation: Appears intact Intelligibility: Intelligible Motor planning: Impaired: phonemic paraphasia in connected speech with intermittent awareness  Interfering components: hearing loss Effective technique: slow rate, increased vocal intensity, and  pacing   ORAL MOTOR EXAMINATION Overall status: WFL Comments: unremarkable  STANDARDIZED ASSESSMENTS: [place] NAMING TEST: 29/50 items accurate x8 NR, x13 semantic paraphasias   PATIENT REPORTED OUTCOME MEASURES (PROM): Communication Participation Item Bank: 15 Very much: communicating quickly, having long conversation Quite a bit: talking to people pt doesn't know, asking questions in conversation, getting turn in fast moving conversation A little: talking to people you know, communicating in community, giving detailed information  TODAY'S TREATMENT:  02-03-22: Trial cues to assess most facilitative context for pt, appears phonemic cueing most helpful during today's session. Rare success with description cues to overcome anomia. SLP provides education on evaluation results, SLP recommendations, POC, and goals. Pt provided opportunity to ask all questions and all are answered to pt satisfaction at this time.    PATIENT EDUCATION: Education details: see above Person educated: Patient Education method: Customer service manager Education comprehension: verbalized understanding, returned demonstration, and needs further education   GOALS: Goals reviewed with patient? Yes  SHORT TERM GOALS: Target date: 03/03/22  Pt will verbalize 7 items in personally relevant categories with occasional min-A over 2 therapy sessions Baseline: Goal status: INITIAL  2.  Pt will correct semantic paraphasia given usual mod-A in 70% of opportunities during structured task over 2 sessions.  Baseline:  Goal status: INITIAL  3.  Pt will write x5 item list with 80% accuracy, given usual mod-A over 2 sessions.  Baseline:  Goal status: INITIAL  4.  Pt will successfully use trained anomia compensation in x3 opportunities across 2 therapy sessions with occasioanl min-A.  Baseline:  Goal status: INITIAL   LONG TERM GOALS: Target date: 03/31/2022  Pt will identify, and correct, 50% of semantic  paraphasias during structured task with occasional min-A over 2 sessions.  Baseline:  Goal status: INITIAL  2.  Pt will report improvement of 2 points via CPIB PROM by d/c Baseline: 15 Goal status: INITIAL  3.  Pt will carryover dysnomia compensations to conversational speech, resulting in strategy use in 70% of opportunities over 15 minute conversation Baseline:  Goal status: INITIAL  4.  Pt will write x10 phrases, with mod-I, with 80% accuracy over 2 sessions.   Baseline:  Goal status: INITIAL  ASSESSMENT:  CLINICAL IMPRESSION: Patient is a 81 y.o. F who was seen today for aphasia evaluation 2/2 stroke. Pt presenting with mild-moderate expressive aphasia. Receptive language skills appear to be within gross normal limits. Spontaneous speech is characterized by usual vague language, anomia and frequent paraphasias impacting clarity and cohesiveness of message. Limited awareness of paraphasias exhibited during evaluation. Trial of cueing from SLP demonstrating pt is stimulable for phonemic and phrase completion to facilitate word finding. Repetition of paraphasia intermittently improved awareness of errors. Written expression additionally impaired, with improved awareness. Unable to write self-generate sentence and reports desire to work on this area. Reports to having successful communication with family and close friends with increased difficulty when out  in community. Below normal limits on confrontation naming task, unable to recall names of caregivers. Requires prolonged time to verbalize church's name. SLP probes cognition, however limited insight or needs ID d/t communication impairment and pt attending session alone. SLP will continue to monitor. I recommend skilled ST to address expressive aphasia to improve QoL and increase communication effectiveness across communication partners and while out in community.   OBJECTIVE IMPAIRMENTS include expressive language, aphasia, and apraxia.  These impairments are limiting patient from effectively communicating at home and in community. Factors affecting potential to achieve goals and functional outcome are family/community support. Patient will benefit from skilled SLP services to address above impairments and improve overall function.  REHAB POTENTIAL: Good  PLAN: SLP FREQUENCY: 2x/week  SLP DURATION: 8 weeks  PLANNED INTERVENTIONS: Language facilitation, Cueing hierachy, Internal/external aids, Functional tasks, Multimodal communication approach, SLP instruction and feedback, Compensatory strategies, and Patient/family education    Su Monks, CCC-SLP 02/03/2022, 2:34 PM

## 2022-01-30 NOTE — Progress Notes (Unsigned)
Cardiology Office Note   Date:  02/02/2022   ID:  [name], DOB [birth year], MRN [id number]  PCP:  Courtney Heys, MD    No chief complaint on file.    Wt Readings from Last 3 Encounters:  02/02/22 172 lb (78 kg)  01/28/22 171 lb (77.6 kg)  12/11/21 173 lb (78.5 kg)       History of Present Illness: [name] is a 81 y.o. 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    She established care in 11/14/2019 for evaluation of coronary artery calcifications. 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  Coronary CTA revealed coronary calcium score of 148, 59th percentile for age and sex matched control.  Dr. Irish Lack advised that there was no significant CAD, one artery could not be visualized well so symptoms of chest tightness persist would consider stress test.     Admission 3/19-3/22/23 for suspected stroke following a fall. Had aphasia and right lower extremity weakness upon arrival to ED. Her daughter had spoken with her the evening prior at 7 PM and the patient was speaking normally.  On day of admission she spoke with the patient at 11 AM and the patient had fallen and was having difficulty getting her words out. CTA revealed occluded distal left MCA M2 branch at posterior sylvian fissure.  Echocardiogram 09/22/2021 revealed normal LVEF 60 to 85%, grade 1 diastolic dysfunction, no significant valve disease.  Outpatient 30-day cardiac monitor was recommended to rule out atrial fibrillation.  Patient advised to continue aspirin 81 mg daily and Plavix 75 mg daily for 3 months and then aspirin alone.  She was transferred to acute inpatient rehab and was  discharged home on 4/6.   She has recovered well.  Some word finding issues.  One fall.  Using walker.    Denies : Chest pain. Dizziness. Leg edema. Nitroglycerin use. Orthopnea. Palpitations. Paroxysmal nocturnal dyspnea. Shortness of breath. Syncope.      Past Medical History:  Diagnosis Date    Multiple pulmonary nodules, largest R mid lung 06/29/2013   Followed in Pulmonary clinic/ [facility]/ [facility] - see CXR 06/27/13  - CT chest 07/14/2013 > 1. No acute findings in the thorax to account for the patient's  symptoms.  2. However, there is a subsolid nodule in the   right lower lobe that has a ground-glass attenuation component  measuring 2.5 x 1.8 cm, and a small central solid component  measuring 4 mm. Initial follow-up by chest CT without    Adenocarcinoma of lung, stage 1 ([place])    Status post right lower lobectomy August 2015   Anxiety    Asthma    Atrophic vaginitis    Chronic cough 12/20/2018   Chronic iritis of left eye    Pupil stays dilated; nonreactive   Chronic kidney disease, stage 3b (HCC)    Colon polyp    Contact lens/glasses fitting    wears contacts or glasses   Cough variant asthma with component of uacs  05/31/2013   Followed in Pulmonary clinic/ [facility]/ [facility] in her 21's - Spirometry 02/23/13 wnl  - 07/03/2013 Sinus CT > Mild chronic sinus disease - No acute findings.  Left-to-right nasal septal deviation of 3 mm. -med calendar 08/08/13 , redone 06/01/2016 and 02/22/2017  - Eos 4%   10/13/2013  > rec singulair daily  - FENO 05/12/2016  =   24 on singulair  - Allergy profile 05/12/2016 >   IgE  47 pos   Depression    Depression with anxiety    Dysuria 08/03/2019   Environmental allergies    Essential hypertension 02/23/2017   Changed losartan to avapro 02/22/2017 due to cough    GERD (gastroesophageal reflux disease)    Hypertension    Hypertriglyceridemia    Kidney stones    OA (osteoarthritis)    Osteopenia    PONV (postoperative nausea and  vomiting)    S/P lobectomy of lung 02/12/2014   Status post total knee replacement, left 08/26/2018   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    Past Surgical History:  Procedure Laterality Date   APPENDECTOMY     COLONOSCOPY     EYE SURGERY Left    cataract removal   ORIF WRIST FRACTURE Left 08/04/2013   Procedure: OPEN REDUCTION INTERNAL FIXATION (ORIF) LEFT DISTAL RADIUS WRIST FRACTURE;  Surgeon: Wynonia Sours, MD;  Location: [facility] [facility];  Service: Orthopedics;  Laterality: Left;   PARTIAL HYSTERECTOMY     TOTAL KNEE ARTHROPLASTY Left 08/26/2018   Procedure: TOTAL KNEE ARTHROPLASTY;  Surgeon: Netta Cedars, MD;  Location: [facility];  Service: Orthopedics;  Laterality: Left;   VIDEO ASSISTED THORACOSCOPY (VATS)/WEDGE RESECTION Right 02/12/2014   Procedure: RIGHT VIDEO ASSISTED THORACOSCOPY RIGHT LOWER LOBE LUNG /WEDGE RESECTION,RIGHT THORACOTOMY WITH RIGHT LOWER LOBE LOBECTOMY & NODE DISSECTION;  Surgeon: Melrose Nakayama, MD;  Location: [facility];  Service: Thoracic;  Laterality: Right;   VIDEO ASSISTED THORACOSCOPY (VATS)/WEDGE RESECTION Right 02/12/2014   VIDEO BRONCHOSCOPY N/A 02/12/2014   Procedure: VIDEO BRONCHOSCOPY;  Surgeon: Melrose Nakayama, MD;  Location: [facility];  Service: Thoracic;  Laterality: N/A;     Current Outpatient Medications  Medication Sig Dispense Refill   albuterol (VENTOLIN HFA) 108 (90 Base) MCG/ACT inhaler TAKE 2 PUFFS BY MOUTH EVERY 6 HOURS AS NEEDED FOR WHEEZE OR SHORTNESS OF BREATH 6.7 each 5   alendronate (FOSAMAX) 70 MG tablet      Ascorbic Acid (VITAMIN C) 100 MG tablet Take 1 tablet (100 mg total) by mouth daily. 30 tablet 0   aspirin EC 81 MG tablet Take 81 mg by mouth daily. Swallow whole.     Besifloxacin HCl (BESIVANCE) 0.6 % SUSP      budesonide-formoterol (SYMBICORT) 80-4.5 MCG/ACT inhaler TAKE 2 PUFFS BY MOUTH TWICE A DAY     Calcium-Magnesium-Vitamin D (CALCIUM 1200+D3 PO) Take 1 tablet by mouth daily.      chlorpheniramine  (CHLOR-TRIMETON) 4 MG tablet      Cholecalciferol (VITAMIN D3 PO) Take 1 capsule by mouth daily.     dorzolamide-timolol (COSOPT) 22.3-6.8 MG/ML ophthalmic solution Place 1 drop into both eyes 2 (two) times daily.     famotidine (PEPCID) 20 MG tablet 1 tablet at bedtime as needed     fenofibrate 160 MG tablet Take 160 mg by mouth daily.     FLUoxetine (PROZAC) 40 MG capsule 1 capsule     gabapentin (NEURONTIN) 100 MG capsule Take 1 capsule (100 mg total) by mouth 3 (three) times daily. One three times daily 270 capsule 1   losartan (COZAAR) 100 MG tablet      montelukast (SINGULAIR) 10 MG tablet Take 1 tablet (10 mg total) by mouth  at bedtime. 90 tablet 0   Multiple Vitamins-Minerals (CENTRUM SILVER PO) Take 1 tablet by mouth daily.     solifenacin (VESICARE) 10 MG tablet Take 10 mg by mouth daily.     traZODone (DESYREL) 50 MG tablet Take 50 mg by mouth at bedtime.     amLODipine (NORVASC) 10 MG tablet Take 1 tablet (10 mg total) by mouth every morning. 10 tablet 0   amLODipine (NORVASC) 5 MG tablet 5 mg by oral route.     No current facility-administered medications for this visit.    Allergies:   Fosamax [alendronate], Grass extracts [gramineae pollens], Nsaids, Other, and Prednisone    Social History:  The patient  reports that she quit smoking about 43 years ago. Her smoking use included cigarettes. She has a 15.00 pack-year smoking history. She has never used smokeless tobacco. She reports current alcohol use of about 1.0 standard drink of alcohol per week. She reports that she does not use drugs.   Family History:  The patient's family history includes Asthma in her father; Breast cancer (age of onset: 40) in her daughter; Heart disease in her father; Lung cancer in her sister; Stroke in her father.    ROS:  Please see the history of present illness.   Otherwise, review of systems are positive for some unsteadiness, I stressed the importance of using her walker.   All other systems  are reviewed and negative.    PHYSICAL EXAM: VS:  BP 122/78   Pulse 69   Ht 5\' 3"  (1.6 m)   Wt 172 lb (78 kg)   SpO2 98%   BMI 30.47 kg/m  , BMI Body mass index is 30.47 kg/m. GEN: Well nourished, well developed, in no acute distress HEENT: normal Neck: no JVD, carotid bruits, or masses Cardiac: RRR; no murmurs, rubs, or gallops,no edema  Respiratory:  clear to auscultation bilaterally, normal work of breathing GI: soft, nontender, nondistended, + BS MS: no deformity or atrophy Skin: warm and dry, no rash Neuro:  Slow gait; some difficulty finding words Psych: euthymic mood, full affect   EKG:   The ekg ordered 4/23 demonstrates NSR   Recent Labs: 09/25/2021: ALT 17; Magnesium 1.7 10/06/2021: BUN 22; Creatinine, Ser 1.34; Hemoglobin 11.1; Platelets 279; Potassium 3.8; Sodium 135   Lipid Panel    Component Value Date/Time   CHOL 151 09/22/2021 0434   TRIG 143 09/22/2021 0434   HDL 53 09/22/2021 0434   CHOLHDL 2.8 09/22/2021 0434   VLDL 29 09/22/2021 0434   LDLCALC 69 09/22/2021 0434     Other studies Reviewed: Additional studies/ records that were reviewed today with results demonstrating: March 2023 total cholesterol 151, HDL 53, LDL 69, triglycerides 143.   ASSESSMENT AND PLAN:  Coronary calcification: Continue aggressive secondary prevention.  No angina.  CT scan result from 2021 reviewed. HTN: The current medical regimen is effective;  continue present plan and medications. Prior CVA: monitor placed to detect AFib in the setting of stroke.  No atrial fibrillation noted.  Getting various therapies.  Trying to get stronger. Hyperlipidemia: Whole food, plant-based diet.  Avoid processed food.  Eat a high-fiber diet.  Lots of vegetables.  Continue fenofibrate.  Patient hoping to avoid statin.  Lipids are well controlled on current therapy. Sinus bradycardia: No syncope. Avoid falls by using walker   Current medicines are reviewed at length with the patient today.   The patient concerns regarding her medicines were addressed.  The following changes have been made:  No change  Labs/ tests ordered today include:  No orders of the defined types were placed in this encounter.   Recommend 150 minutes/week of aerobic exercise Low fat, low carb, high fiber diet recommended  Disposition:   FU in 1 year   Signed, Larae Grooms, MD  02/02/2022 10:36 AM    [facility] [address], [place], [place]  [address] Phone: [phone]; Fax: [phone]

## 2022-02-02 ENCOUNTER — Ambulatory Visit: Payer: Medicare Other | Admitting: Interventional Cardiology

## 2022-02-02 ENCOUNTER — Encounter: Payer: Self-pay | Admitting: Interventional Cardiology

## 2022-02-02 VITALS — BP 122/78 | HR 69 | Ht 63.0 in | Wt 172.0 lb

## 2022-02-02 DIAGNOSIS — E785 Hyperlipidemia, unspecified: Secondary | ICD-10-CM | POA: Diagnosis not present

## 2022-02-02 DIAGNOSIS — I10 Essential (primary) hypertension: Secondary | ICD-10-CM | POA: Diagnosis not present

## 2022-02-02 DIAGNOSIS — I639 Cerebral infarction, unspecified: Secondary | ICD-10-CM | POA: Diagnosis not present

## 2022-02-02 DIAGNOSIS — I2510 Atherosclerotic heart disease of native coronary artery without angina pectoris: Secondary | ICD-10-CM | POA: Diagnosis not present

## 2022-02-02 NOTE — Patient Instructions (Signed)

## 2022-02-03 ENCOUNTER — Ambulatory Visit: Payer: Medicare Other | Attending: Physical Medicine and Rehabilitation | Admitting: Physical Therapy

## 2022-02-03 ENCOUNTER — Encounter: Payer: Self-pay | Admitting: Physical Therapy

## 2022-02-03 ENCOUNTER — Ambulatory Visit: Payer: Medicare Other | Admitting: Speech Pathology

## 2022-02-03 DIAGNOSIS — I69351 Hemiplegia and hemiparesis following cerebral infarction affecting right dominant side: Secondary | ICD-10-CM | POA: Diagnosis not present

## 2022-02-03 DIAGNOSIS — M6281 Muscle weakness (generalized): Secondary | ICD-10-CM | POA: Insufficient documentation

## 2022-02-03 DIAGNOSIS — I63512 Cerebral infarction due to unspecified occlusion or stenosis of left middle cerebral artery: Secondary | ICD-10-CM | POA: Insufficient documentation

## 2022-02-03 DIAGNOSIS — R4701 Aphasia: Secondary | ICD-10-CM

## 2022-02-03 DIAGNOSIS — I69190 Apraxia following nontraumatic intracerebral hemorrhage: Secondary | ICD-10-CM | POA: Insufficient documentation

## 2022-02-03 DIAGNOSIS — G8191 Hemiplegia, unspecified affecting right dominant side: Secondary | ICD-10-CM | POA: Insufficient documentation

## 2022-02-03 DIAGNOSIS — R482 Apraxia: Secondary | ICD-10-CM | POA: Insufficient documentation

## 2022-02-03 DIAGNOSIS — R41841 Cognitive communication deficit: Secondary | ICD-10-CM | POA: Diagnosis present

## 2022-02-03 DIAGNOSIS — R2689 Other abnormalities of gait and mobility: Secondary | ICD-10-CM | POA: Diagnosis not present

## 2022-02-03 NOTE — Therapy (Signed)
OUTPATIENT PHYSICAL THERAPY NEURO EVALUATION   Patient Name: [name] MRN: [id number] DOB:May 22, 1941, 81 y.o., female Today's Date: 02/03/2022   PCP: Pt unable to provide information and unable to locate in chart. REFERRING PROVIDER: Courtney Heys, MD    PT End of Session - 02/03/22 [phone]     Visit Number 1    Number of Visits 17   plus eval   Date for PT Re-Evaluation 03/31/22    Authorization Type [employer] Medicare    PT Start Time 0933    PT Stop Time 1015    PT Time Calculation (min) 42 min    Equipment Utilized During Treatment Gait belt    Activity Tolerance Patient tolerated treatment well    Behavior During Therapy WFL for tasks assessed/performed             Past Medical History:  Diagnosis Date    Multiple pulmonary nodules, largest R mid lung 06/29/2013   Followed in Pulmonary clinic/ [facility]/ [facility] - see CXR 06/27/13  - CT chest 07/14/2013 > 1. No acute findings in the thorax to account for the patient's  symptoms.  2. However, there is a subsolid nodule in the   right lower lobe that has a ground-glass attenuation component  measuring 2.5 x 1.8 cm, and a small central solid component  measuring 4 mm. Initial follow-up by chest CT without    Adenocarcinoma of lung, stage 1 ([place])    Status post right lower lobectomy August 2015   Anxiety    Asthma    Atrophic vaginitis    Chronic cough 12/20/2018   Chronic iritis of left eye    Pupil stays dilated; nonreactive   Chronic kidney disease, stage 3b (HCC)    Colon polyp    Contact lens/glasses fitting    wears contacts or glasses   Cough variant asthma with component of uacs  05/31/2013   [facility] in her 63's - Spirometry 02/23/13 wnl  - 07/03/2013 Sinus CT > Mild chronic sinus disease - No acute findings. Left-to-right nasal septal deviation of 3 mm. -med calendar 08/08/13 , redone 06/01/2016 and 02/22/2017  - Eos 4%   10/13/2013  > rec singulair daily  -  FENO 05/12/2016  =   24 on singulair  - Allergy profile 05/12/2016 >   IgE  47 pos   Depression    Depression with anxiety    Dysuria 08/03/2019   Environmental allergies    Essential hypertension 02/23/2017   Changed losartan to avapro 02/22/2017 due to cough    GERD (gastroesophageal reflux disease)    Hypertension    Hypertriglyceridemia    Kidney stones    OA (osteoarthritis)    Osteopenia    PONV (postoperative nausea and vomiting)    S/P lobectomy of lung 02/12/2014   Status post total knee replacement, left 08/26/2018   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   Past Surgical History:  Procedure Laterality Date   APPENDECTOMY     COLONOSCOPY     EYE SURGERY Left    cataract removal   ORIF WRIST FRACTURE Left 08/04/2013   Procedure: OPEN REDUCTION INTERNAL FIXATION (ORIF) LEFT DISTAL RADIUS WRIST FRACTURE;  Surgeon: Wynonia Sours, MD;  Location: [facility];  Service: Orthopedics;  Laterality: Left;   PARTIAL HYSTERECTOMY     TOTAL KNEE ARTHROPLASTY Left 08/26/2018   Procedure: TOTAL KNEE ARTHROPLASTY;  Surgeon: Netta Cedars, MD;  Location[facility];  Service:  Orthopedics;  Laterality: Left;   VIDEO ASSISTED THORACOSCOPY (VATS)/WEDGE RESECTION Right 02/12/2014   Procedure: RIGHT VIDEO ASSISTED THORACOSCOPY RIGHT LOWER LOBE LUNG /WEDGE RESECTION,RIGHT THORACOTOMY WITH RIGHT LOWER LOBE LOBECTOMY & NODE DISSECTION;  Surgeon: Melrose Nakayama, MD;  Location: [facility];  Service: Thoracic;  Laterality: Right;   VIDEO ASSISTED THORACOSCOPY (VATS)/WEDGE RESECTION Right 02/12/2014   VIDEO BRONCHOSCOPY N/A 02/12/2014   Procedure: VIDEO BRONCHOSCOPY;  Surgeon: Melrose Nakayama, MD;  Location: [facility];  Service: Thoracic;  Laterality: N/A;   Patient Active Problem List   Diagnosis Date Noted   Acute ischemic left MCA stroke ([place]) 01/28/2022   Aphasia as late effect of cerebrovascular accident (CVA) 01/28/2022   Right hemiparesis ([place]) 01/28/2022   Abnormal gait due to muscle  weakness 01/28/2022   Insomnia 10/13/2021   Frequency of urination 10/13/2021   Vitamin D deficiency 10/13/2021   Chronic renal failure 10/13/2021   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[place]) 09/24/2021   Aphasia due to acute cerebrovascular accident (CVA) ([place]) 09/21/2021   Chronic kidney disease, stage 3b ([place]) 09/21/2021   Depression with anxiety 09/21/2021   Hypertension 09/21/2021   Asthma, chronic 09/21/2021   Fall at home, initial encounter 09/21/2021   Right ankle pain 09/21/2021   Dysuria 08/03/2019   Chronic cough 12/20/2018   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   Status post total knee replacement, left 08/26/2018   Essential hypertension 02/23/2017   Adenocarcinoma of lung, stage 1 ([place]) 05/08/2014   S/P lobectomy of lung 02/12/2014   Asthma 01/30/2014    Multiple pulmonary nodules, largest R mid lung 06/29/2013   Cough variant asthma with component of uacs  05/31/2013    ONSET DATE: 01/28/2022   REFERRING DIAG: G81.91 (ICD-10-CM) - Right hemiparesis (HCC) I63.512 (ICD-10-CM) - Acute ischemic left MCA stroke (HCC)   THERAPY DIAG:  Muscle weakness (generalized)  Other abnormalities of gait and mobility  Rationale for Evaluation and Treatment Rehabilitation  SUBJECTIVE:                                                                                                                                                                                             SUBJECTIVE STATEMENT: Pt reports she is here for her R foot pain, has been hurting since she fell when she had the stroke. Imaging taken at that time shows no acute fracture or dislocation. Pt reports she has been to see an orthopedic doctor (at [facility]?). Pt was given a R ankle ASO stabilizer and is wearing it to this appointment. Pt reports she feels that the ankle brace is helping with stability and  she doesn't feel like her ankle wants to give out anymore. Pt also enters the clinic  using a RW but reports she primarily uses her RW in the home and a rollator in the community. Pt also reports walking short distances in her home with no AD at times. Difficult to understand pt at times due to expressive aphasia.  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 Yes: NPRS scale: 5/10 Pain location: R ankle Pain description: achy Aggravating factors: walking Relieving factors: nothing  PRECAUTIONS: Fall  WEIGHT BEARING RESTRICTIONS No  FALLS: Has patient fallen in last 6 months? Yes. Number of falls 2: fell when she had CVA, fell last Saturday trying to let the dog out (landed on her butt, needed help to get up)  LIVING ENVIRONMENT: Lives with: lives alone and has caregivers 12 hours/day for now Lives in: House/apartment Stairs: Yes: External: 1 steps; none Has following equipment at home: Gilford Rile - 2 wheeled, Environmental consultant - 4 wheeled, bed side commode, and Grab bars  PLOF: Needs assistance with gait, Needs assistance with transfers, and at Supervision level overall  PATIENT GOALS "to be able to walk without help and without pain"  OBJECTIVE:   DIAGNOSTIC FINDINGS:   IMPRESSION: (MRI 09/22/2021) 1. Early subacute infarct of the left MCA territory involving the left parietal and temporal lobes. No hemorrhage or mass effect. 2. Old infarcts of the left basal ganglia and both cerebellar hemispheres.  IMPRESSION: (R ankle xray 09/21/2021) No acute fracture or dislocation.  COGNITION: Overall cognitive status: Impaired   SENSATION: N/T in RLE>LLE  COORDINATION: Over and undershooting, difficulty following cues with LUE  Impaired BLE vs difficulty following directions  EDEMA:  Mild swelling in RLE, has not worn compression stockings since d/c from hospital  POSTURE:  rounded shoulders and forward head  LOWER EXTREMITY ROM:     Passive  Right Eval Left Eval  Hip flexion    Hip extension    Hip abduction    Hip adduction    Hip internal rotation    Hip external rotation    Knee flexion    Knee extension    Ankle dorsiflexion WFL   Ankle plantarflexion WFL   Ankle inversion WFL   Ankle eversion WFL    (Blank rows = not tested)  LOWER EXTREMITY MMT:    MMT Right Eval Left Eval  Hip flexion 4 4  Hip extension    Hip abduction 5 5  Hip adduction 5 5  Hip internal rotation    Hip external rotation    Knee flexion 4 5  Knee extension 5 5  Ankle dorsiflexion 4 5  Ankle plantarflexion    Ankle inversion    Ankle eversion    (Blank rows = not tested)  BED MOBILITY:  Independent per patient report  TRANSFERS: Assistive device utilized: Environmental consultant - 2 wheeled  Sit to stand: SBA Stand to sit: SBA Chair to chair: SBA Floor:  unsafe to attempt at this time  GAIT: Gait pattern: decreased step length- Right, decreased hip/knee flexion- Right, and decreased ankle dorsiflexion- Right Distance walked: various clinic distances Assistive device utilized: Walker - 2 wheeled Level of assistance: SBA  Gait pattern: decreased step length- Right, decreased hip/knee flexion- Right, and decreased ankle dorsiflexion- Right Distance walked: 32 ft Assistive device utilized: None Level of  assistance: CGA Comments: comparable speed, gait pattern, and balance for short distance gait with no AD as compared to RW   FUNCTIONAL TESTs:    [facility] PT Assessment - 02/03/22 0956       Ambulation/Gait   Gait velocity 32.8 ft over 14.46 sec = 2.27 ft/sec   with RW and SBA; same speed with no AD and CGA     Standardized Balance Assessment   Standardized Balance Assessment Timed Up and Go Test;Five Times Sit to Stand    Five times sit to stand comments  13.8 sec, no UE support      Timed Up and Go Test   TUG Normal TUG    Normal TUG (seconds) 14   with RW  and Supervision; 14.9 with no AD and CGA            TODAY'S TREATMENT:  PT Evaluation  PATIENT EDUCATION: Education details: Eval findings, POC Person educated: Patient Education method: Customer service manager Education comprehension: verbalized understanding   HOME EXERCISE PROGRAM: To be established next session   GOALS: Goals reviewed with patient? Yes  SHORT TERM GOALS: Target date: 03/03/2022  Initiate HEP Baseline: Goal status: INITIAL  2.  Pt will improve gait velocity to at least 2.0 ft/sec for improved gait efficiency and performance at Supervision level with LRAD Baseline: 2.26 ft/sec S* with RW (8/1) Goal status: INITIAL  3.  Pt will improve normal TUG to less than or equal to 13 seconds for improved functional mobility and decreased fall risk. Baseline: 14 sec with RW (8/1) Goal status: INITIAL  4.  Berg Balance Scale to be assessed and STG/LTG to be written Baseline:  Goal status: INITIAL  5.  Pt will improve 5 x STS to less than or equal to 12 seconds to demonstrate improved functional strength and transfer efficiency.  Baseline: 13.8 (8/1) Goal status: INITIAL   LONG TERM GOALS: Target date: 03/31/2022  Pt will be independent with balance and strengthening HEP for improved strength, balance, transfers and gait. Baseline:  Goal status: INITIAL  2.  Pt will improve gait velocity to at least 1.5 ft/sec for improved gait efficiency and performance at mod I level with LRAD Baseline:  Goal status: INITIAL  3.  Pt will improve normal TUG to less than or equal to 12 seconds for improved functional mobility and decreased fall risk. Baseline: 14 sec with RW (8/1) Goal status: INITIAL  4.  Berg Balance Scale to be assessed and STG/LTG to be written Baseline:  Goal status: INITIAL  5.  Pt will improve 5 x STS to less than or equal to 10 seconds to demonstrate improved functional strength and transfer efficiency.  Baseline: 13.8 sec  (8/1) Goal status: INITIAL   ASSESSMENT:  CLINICAL IMPRESSION: Patient is a 81 year old female referred to Neuro OPPT for L MCA CVA.   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 Based on her scores on various outcome measures including taking 13.8 sec to perform the 5xSTS, 14 sec for the TUG, and having a gait speed of 2.26 ft/sec, pt is an increased risk for falls. Pt would benefit from skilled PT to address these impairments and functional limitations to maximize functional mobility independence.    OBJECTIVE IMPAIRMENTS Abnormal gait, decreased balance,  decreased coordination, decreased mobility, decreased strength, decreased safety awareness, and pain.   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   REHAB POTENTIAL: Good  CLINICAL DECISION MAKING: Stable/uncomplicated  EVALUATION COMPLEXITY: Moderate  PLAN: PT FREQUENCY: 2x/week  PT DURATION: 8 weeks  PLANNED INTERVENTIONS: Therapeutic exercises, Therapeutic activity, Neuromuscular re-education, Balance training, Gait training, Patient/Family education, Self Care, Joint mobilization, Stair training, DME instructions, Electrical stimulation, Cryotherapy, Moist heat, Taping, Manual therapy, and Re-evaluation  PLAN FOR NEXT SESSION:  Trial  gait with no AD, initiate HEP for balance and strengthening, assess Berg and set STG and LTG, assess FGA if appropriate with no AD   Excell Seltzer, PT, DPT, CSRS 02/03/2022, 4:17 PM

## 2022-02-05 ENCOUNTER — Encounter: Payer: Self-pay | Admitting: Occupational Therapy

## 2022-02-05 ENCOUNTER — Ambulatory Visit: Payer: Medicare Other | Admitting: Occupational Therapy

## 2022-02-05 DIAGNOSIS — I63512 Cerebral infarction due to unspecified occlusion or stenosis of left middle cerebral artery: Secondary | ICD-10-CM | POA: Diagnosis not present

## 2022-02-05 DIAGNOSIS — R4701 Aphasia: Secondary | ICD-10-CM | POA: Diagnosis not present

## 2022-02-05 DIAGNOSIS — M6281 Muscle weakness (generalized): Secondary | ICD-10-CM

## 2022-02-05 DIAGNOSIS — I69190 Apraxia following nontraumatic intracerebral hemorrhage: Secondary | ICD-10-CM | POA: Diagnosis not present

## 2022-02-05 DIAGNOSIS — R482 Apraxia: Secondary | ICD-10-CM | POA: Diagnosis not present

## 2022-02-05 DIAGNOSIS — I69351 Hemiplegia and hemiparesis following cerebral infarction affecting right dominant side: Secondary | ICD-10-CM

## 2022-02-05 DIAGNOSIS — G8191 Hemiplegia, unspecified affecting right dominant side: Secondary | ICD-10-CM | POA: Diagnosis not present

## 2022-02-05 DIAGNOSIS — R2689 Other abnormalities of gait and mobility: Secondary | ICD-10-CM | POA: Diagnosis not present

## 2022-02-05 NOTE — Therapy (Signed)
OUTPATIENT OCCUPATIONAL THERAPY NEURO EVALUATION  Patient Name: [name] MRN: [id number] DOB:08-04-1940, 81 y.o., female Today's Date: 02/05/2022  PCP: NA REFERRING PROVIDER: Courtney Heys, MD    OT End of Session - 02/05/22 1727     Visit Number 1    Number of Visits 9    Authorization Type [employer]    Progress Note Due on Visit 10    OT Start Time 1315    OT Stop Time 1400    OT Time Calculation (min) 45 min    Activity Tolerance Patient tolerated treatment well    Behavior During Therapy WFL for tasks assessed/performed             Past Medical History:  Diagnosis Date    Multiple pulmonary nodules, largest R mid lung 06/29/2013   Followed in Pulmonary clinic/ [facility]/ [place] - see CXR 06/27/13  - CT chest 07/14/2013 > 1. No acute findings in the thorax to account for the patient's  symptoms.  2. However, there is a subsolid nodule in the   right lower lobe that has a ground-glass attenuation component  measuring 2.5 x 1.8 cm, and a small central solid component  measuring 4 mm. Initial follow-up by chest CT without    Adenocarcinoma of lung, stage 1 ([place])    Status post right lower lobectomy August 2015   Anxiety    Asthma    Atrophic vaginitis    Chronic cough 12/20/2018   Chronic iritis of left eye    Pupil stays dilated; nonreactive   Chronic kidney disease, stage 3b (HCC)    Colon polyp    Contact lens/glasses fitting    wears contacts or glasses   Cough variant asthma with component of uacs  05/31/2013   Followed in Pulmonary clinic/ [facility]/ [place] in her 43's - Spirometry 02/23/13 wnl  - 07/03/2013 Sinus CT > Mild chronic sinus disease - No acute findings. Left-to-right nasal septal deviation of 3 mm. -med calendar 08/08/13 , redone 06/01/2016 and 02/22/2017  - Eos 4%   10/13/2013  > rec singulair daily  - FENO 05/12/2016  =   24 on singulair  - Allergy profile 05/12/2016 >   IgE  47 pos   Depression    Depression with anxiety     Dysuria 08/03/2019   Environmental allergies    Essential hypertension 02/23/2017   Changed losartan to avapro 02/22/2017 due to cough    GERD (gastroesophageal reflux disease)    Hypertension    Hypertriglyceridemia    Kidney stones    OA (osteoarthritis)    Osteopenia    PONV (postoperative nausea and vomiting)    S/P lobectomy of lung 02/12/2014   Status post total knee replacement, left 08/26/2018   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   Past Surgical History:  Procedure Laterality Date   APPENDECTOMY     COLONOSCOPY     EYE SURGERY Left    cataract removal   ORIF WRIST FRACTURE Left 08/04/2013   Procedure: OPEN REDUCTION INTERNAL FIXATION (ORIF) LEFT DISTAL RADIUS WRIST FRACTURE;  Surgeon: Wynonia Sours, MD;  Location: [place];  Service: Orthopedics;  Laterality: Left;   PARTIAL HYSTERECTOMY     TOTAL KNEE ARTHROPLASTY Left 08/26/2018   Procedure: TOTAL KNEE ARTHROPLASTY;  Surgeon: Netta Cedars, MD;  Location: [facility];  Service: Orthopedics;  Laterality: Left;   VIDEO ASSISTED THORACOSCOPY (VATS)/WEDGE RESECTION Right 02/12/2014   Procedure: RIGHT VIDEO ASSISTED THORACOSCOPY RIGHT LOWER LOBE  LUNG /WEDGE RESECTION,RIGHT THORACOTOMY WITH RIGHT LOWER LOBE LOBECTOMY & NODE DISSECTION;  Surgeon: Melrose Nakayama, MD;  Location: [place];  Service: Thoracic;  Laterality: Right;   VIDEO ASSISTED THORACOSCOPY (VATS)/WEDGE RESECTION Right 02/12/2014   VIDEO BRONCHOSCOPY N/A 02/12/2014   Procedure: VIDEO BRONCHOSCOPY;  Surgeon: Melrose Nakayama, MD;  Location: [place];  Service: Thoracic;  Laterality: N/A;   Patient Active Problem List   Diagnosis Date Noted   Acute ischemic left MCA stroke ([place]) 01/28/2022   Aphasia as late effect of cerebrovascular accident (CVA) 01/28/2022   Right hemiparesis ([place]) 01/28/2022   Abnormal gait due to muscle weakness 01/28/2022   Insomnia 10/13/2021   Frequency of urination 10/13/2021   Vitamin D deficiency 10/13/2021   Chronic renal  failure 10/13/2021   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[place]) 09/24/2021   Aphasia due to acute cerebrovascular accident (CVA) ([place]) 09/21/2021   Chronic kidney disease, stage 3b ([place]) 09/21/2021   Depression with anxiety 09/21/2021   Hypertension 09/21/2021   Asthma, chronic 09/21/2021   Fall at home, initial encounter 09/21/2021   Right ankle pain 09/21/2021   Dysuria 08/03/2019   Chronic cough 12/20/2018   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   Status post total knee replacement, left 08/26/2018   Essential hypertension 02/23/2017   Adenocarcinoma of lung, stage 1 ([place]) 05/08/2014   S/P lobectomy of lung 02/12/2014   Asthma 01/30/2014    Multiple pulmonary nodules, largest R mid lung 06/29/2013   Cough variant asthma with component of uacs  05/31/2013    ONSET DATE: 01/28/2022   REFERRING DIAG: G81.91 (ICD-10-CM) - Right hemiparesis (HCC) I63.512 (ICD-10-CM) - Acute ischemic left MCA stroke (HCC)   THERAPY DIAG:  Hemiplegia and hemiparesis following cerebral infarction affecting right dominant side (HCC)  Muscle weakness (generalized)  Apraxia  Rationale for Evaluation and Treatment Rehabilitation  SUBJECTIVE:   SUBJECTIVE STATEMENT: My caregiver drives my car Pt accompanied by: self  PERTINENT HISTORY:  hx of hypertension, thrombotic stroke involving left middle cerebral artery, asthma, lung cancer, coronary calcification seen on CT and CKD    PRECAUTIONS: Fall  WEIGHT BEARING RESTRICTIONS No  PAIN:  Are you having pain? Yes - right ankle - throbbing  FALLS: Has patient fallen in last 6 months? Yes [place] last week.  Fell backward, could not get up without help  LIVING ENVIRONMENT: Lives with: lives with their family and lives alone Lives in: House/apartment  Has following equipment at home: Environmental consultant - 4 wheeled, Electronics engineer, and Grab bars  PLOF: Independent with basic ADLs  PATIENT GOALS I want to be more independent  OBJECTIVE:    HAND DOMINANCE: Right  ADLs: Overall ADLs: Min assist Transfers/ambulation related to ADLs: walks with rollator Eating: mod I Grooming: mod I UB Dressing: Min assist LB Dressing: Min assist to don brace right ankle Toileting: modified independent Bathing: Min Nurse, mental health transfers: Supervision Equipment: Walk in shower, grab bars   IADLs: Shopping: Shops with caregiver or daughter Light housekeeping: has a cleaning lady 2x/month, has not been cleaning Meal Prep: has done light meals - does not use stovetop or oven - does not have big desire to use these Community mobility: Driving  Medication management: Daughter manages - patient is interested in Designer, television/film set management: independent Handwriting: 90% legible    POSTURE COMMENTS:  No Significant postural limitations Sitting balance: Moves/returns truncal midpoint >2 inches in all planes  ACTIVITY TOLERANCE: Activity tolerance: Naps most days - and reports this was prior  to stroke as well    UPPER EXTREMITY ROM     Active ROM Right eval Left eval  Shoulder flexion [facility]  Shoulder abduction    Shoulder adduction    Shoulder extension    Shoulder internal rotation    Shoulder external rotation    Elbow flexion    Elbow extension    Wrist flexion    Wrist extension    Wrist ulnar deviation    Wrist radial deviation    Wrist pronation    Wrist supination    (Blank rows = not tested)   UPPER EXTREMITY MMT:     MMT Right eval Left eval  Shoulder flexion 4-/5 4+/5  Shoulder abduction    Shoulder adduction    Shoulder extension    Shoulder internal rotation    Shoulder external rotation    Middle trapezius    Lower trapezius    Elbow flexion    Elbow extension    Wrist flexion    Wrist extension    Wrist ulnar deviation    Wrist radial deviation    Wrist pronation    Wrist supination    (Blank rows = not tested)  HAND FUNCTION: Grip strength: Right: 50 lbs; Left: 32.1 lbs and  Lateral pinch: Right: 8 lbs, Left: 8 lbs  COORDINATION:  9 Hole Peg test: Right: 38.72 sec; Left: 39.93 sec SENSATION: Light touch: Impaired     MUSCLE TONE: RUE: Within functional limits and LUE: Within functional limits  COGNITION: Overall cognitive status: Within functional limits for tasks assessed  VISION: Subjective report: Macular degeneration,  Baseline vision: Wears glasses all the time Visual history: cataracts  VISION ASSESSMENT: To be further assessed in functional context  Patient has difficulty with following activities due to following visual impairments: NA  PERCEPTION: WFL  PRAXIS: Impaired: Initiation and Motor planning      PATIENT EDUCATION: Education details: Results of evaluation Person educated: Patient Education method: Explanation Education comprehension: verbalized understanding   HOME EXERCISE PROGRAM: TBD    GOALS: Goals reviewed with patient? Yes  SHORT TERM GOALS: Target date: 03/08/22  Patient will complete a home activity program designed to decrease level of care provided by paid caregivers Baseline: Goal status: INITIAL  2.  Patient will transfer into / out of shower with distant supervision Baseline:  Goal status: INITIAL  3.  Patient will wash her back with AE without physical assistance Baseline:  Goal status: INITIAL     LONG TERM GOALS: Target date: 04/07/22  Patient will reduce at least 6 hours total of paid caregiver time Baseline:  Goal status: INITIAL  2.  Patient will demonstrate awareness of return to driving recommendations Baseline:  Goal status: INITIAL  3.  Patient will dress herself with modified independence including right ankle brace Baseline:  Goal status: INITIAL  4.  Patient will demonstrate improved stand balance as evidenced by standing without UE support during functional activities.   Baseline:  Goal status: INITIAL   ASSESSMENT:  CLINICAL IMPRESSION: Patient is a 81 y.o.  female who was seen today for occupational therapy evaluation for RUE mild hemiparesis, mild apraxia, and limited independence with BADL/IADL/  PERFORMANCE DEFICITS in functional skills including coordination, dexterity, proprioception, sensation, tone, ROM, strength, flexibility, FMC, GMC, and UE functional use,   IMPAIRMENTS are limiting patient from ADLs and IADLs.   COMORBIDITIES may have co-morbidities  that affects occupational performance. Patient will benefit from skilled OT to address above impairments and improve overall function.  MODIFICATION OR ASSISTANCE TO COMPLETE EVALUATION: Min-Moderate modification of tasks or assist with assess necessary to complete an evaluation.  OT OCCUPATIONAL PROFILE AND HISTORY: Detailed assessment: Review of records and additional review of physical, cognitive, psychosocial history related to current functional performance.  CLINICAL DECISION MAKING: Moderate - several treatment options, min-mod task modification necessary  REHAB POTENTIAL: Good  EVALUATION COMPLEXITY: Moderate    PLAN: OT FREQUENCY: 1x/week  OT DURATION: 8 weeks  PLANNED INTERVENTIONS: self care/ADL training, therapeutic exercise, therapeutic activity, neuromuscular re-education, balance training, functional mobility training, aquatic therapy, patient/family education, visual/perceptual remediation/compensation, and DME and/or AE instructions  RECOMMENDED OTHER SERVICES: NA  CONSULTED AND AGREED WITH PLAN OF CARE: Patient  PLAN FOR NEXT SESSION: Discuss AE for shower to increase independence, stand balance with decreased UE support   Mariah Milling, OT 02/05/2022, 5:29 PM

## 2022-02-06 DIAGNOSIS — N1832 Chronic kidney disease, stage 3b: Secondary | ICD-10-CM | POA: Diagnosis not present

## 2022-02-09 ENCOUNTER — Other Ambulatory Visit: Payer: Self-pay | Admitting: Thoracic Surgery (Cardiothoracic Vascular Surgery)

## 2022-02-09 DIAGNOSIS — C3490 Malignant neoplasm of unspecified part of unspecified bronchus or lung: Secondary | ICD-10-CM

## 2022-02-10 ENCOUNTER — Ambulatory Visit: Payer: Medicare Other | Admitting: Physical Therapy

## 2022-02-10 ENCOUNTER — Ambulatory Visit: Payer: Medicare Other | Admitting: Speech Pathology

## 2022-02-10 DIAGNOSIS — I69190 Apraxia following nontraumatic intracerebral hemorrhage: Secondary | ICD-10-CM | POA: Diagnosis not present

## 2022-02-10 DIAGNOSIS — G8191 Hemiplegia, unspecified affecting right dominant side: Secondary | ICD-10-CM | POA: Diagnosis not present

## 2022-02-10 DIAGNOSIS — R482 Apraxia: Secondary | ICD-10-CM | POA: Diagnosis not present

## 2022-02-10 DIAGNOSIS — R2689 Other abnormalities of gait and mobility: Secondary | ICD-10-CM | POA: Diagnosis not present

## 2022-02-10 DIAGNOSIS — R4701 Aphasia: Secondary | ICD-10-CM | POA: Diagnosis not present

## 2022-02-10 DIAGNOSIS — I69351 Hemiplegia and hemiparesis following cerebral infarction affecting right dominant side: Secondary | ICD-10-CM | POA: Diagnosis not present

## 2022-02-10 DIAGNOSIS — M6281 Muscle weakness (generalized): Secondary | ICD-10-CM | POA: Diagnosis not present

## 2022-02-10 DIAGNOSIS — I63512 Cerebral infarction due to unspecified occlusion or stenosis of left middle cerebral artery: Secondary | ICD-10-CM | POA: Diagnosis not present

## 2022-02-10 DIAGNOSIS — R41841 Cognitive communication deficit: Secondary | ICD-10-CM

## 2022-02-10 NOTE — Therapy (Signed)
OUTPATIENT PHYSICAL THERAPY NEURO THERAPY   Patient Name: [name] MRN: [id number] DOB:06-23-1941, 81 y.o., female Today's Date: 02/10/2022   PCP: Pt unable to provide information and unable to locate in chart. REFERRING PROVIDER: Courtney Heys, MD    PT End of Session - 02/10/22 [phone]     Visit Number 2    Number of Visits 17   plus eval   Date for PT Re-Evaluation 03/31/22    Authorization Type [employer] Medicare    PT Start Time 0930    PT Stop Time 1015    PT Time Calculation (min) 45 min    Equipment Utilized During Treatment Gait belt    Activity Tolerance Patient tolerated treatment well    Behavior During Therapy WFL for tasks assessed/performed              Past Medical History:  Diagnosis Date    Multiple pulmonary nodules, largest R mid lung 06/29/2013   Followed in Pulmonary clinic/ [facility]/ [place] - see CXR 06/27/13  - CT chest 07/14/2013 > 1. No acute findings in the thorax to account for the patient's  symptoms.  2. However, there is a subsolid nodule in the   right lower lobe that has a ground-glass attenuation component  measuring 2.5 x 1.8 cm, and a small central solid component  measuring 4 mm. Initial follow-up by chest CT without    Adenocarcinoma of lung, stage 1 ([place])    Status post right lower lobectomy August 2015   Anxiety    Asthma    Atrophic vaginitis    Chronic cough 12/20/2018   Chronic iritis of left eye    Pupil stays dilated; nonreactive   Chronic kidney disease, stage 3b (HCC)    Colon polyp    Contact lens/glasses fitting    wears contacts or glasses   Cough variant asthma with component of uacs  05/31/2013   Followed in Pulmonary clinic/ [facility]/ [place] [facility] in her 32's - Spirometry 02/23/13 wnl  - 07/03/2013 Sinus CT > Mild chronic sinus disease - No acute findings. Left-to-right nasal septal deviation of 3 mm. -med calendar 08/08/13 , redone 06/01/2016 and 02/22/2017  - Eos 4%   10/13/2013  > rec singulair daily  -  FENO 05/12/2016  =   24 on singulair  - Allergy profile 05/12/2016 >   IgE  47 pos   Depression    Depression with anxiety    Dysuria 08/03/2019   Environmental allergies    Essential hypertension 02/23/2017   Changed losartan to avapro 02/22/2017 due to cough    GERD (gastroesophageal reflux disease)    Hypertension    Hypertriglyceridemia    Kidney stones    OA (osteoarthritis)    Osteopenia    PONV (postoperative nausea and vomiting)    S/P lobectomy of lung 02/12/2014   Status post total knee replacement, left 08/26/2018   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   Past Surgical History:  Procedure Laterality Date   APPENDECTOMY     COLONOSCOPY     EYE SURGERY Left    cataract removal   ORIF WRIST FRACTURE Left 08/04/2013   Procedure: OPEN REDUCTION INTERNAL FIXATION (ORIF) LEFT DISTAL RADIUS WRIST FRACTURE;  Surgeon: Wynonia Sours, MD;  Location: [place];  Service: Orthopedics;  Laterality: Left;   PARTIAL HYSTERECTOMY     TOTAL KNEE ARTHROPLASTY Left 08/26/2018   Procedure: TOTAL KNEE ARTHROPLASTY;  Surgeon: Netta Cedars, MD;  Location: [facility];  Service: Orthopedics;  Laterality: Left;   VIDEO ASSISTED THORACOSCOPY (VATS)/WEDGE RESECTION Right 02/12/2014   Procedure: RIGHT VIDEO ASSISTED THORACOSCOPY RIGHT LOWER LOBE LUNG /WEDGE RESECTION,RIGHT THORACOTOMY WITH RIGHT LOWER LOBE LOBECTOMY & NODE DISSECTION;  Surgeon: Melrose Nakayama, MD;  Location: [place];  Service: Thoracic;  Laterality: Right;   VIDEO ASSISTED THORACOSCOPY (VATS)/WEDGE RESECTION Right 02/12/2014   VIDEO BRONCHOSCOPY N/A 02/12/2014   Procedure: VIDEO BRONCHOSCOPY;  Surgeon: Melrose Nakayama, MD;  Location: [place];  Service: Thoracic;  Laterality: N/A;   Patient Active Problem List   Diagnosis Date Noted   Acute ischemic left MCA stroke ([place]) 01/28/2022   Aphasia as late effect of cerebrovascular accident (CVA) 01/28/2022   Right hemiparesis ([place]) 01/28/2022   Abnormal gait due to muscle  weakness 01/28/2022   Insomnia 10/13/2021   Frequency of urination 10/13/2021   Vitamin D deficiency 10/13/2021   Chronic renal failure 10/13/2021   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[place]) 09/24/2021   Aphasia due to acute cerebrovascular accident (CVA) ([place]) 09/21/2021   Chronic kidney disease, stage 3b ([place]) 09/21/2021   Depression with anxiety 09/21/2021   Hypertension 09/21/2021   Asthma, chronic 09/21/2021   Fall at home, initial encounter 09/21/2021   Right ankle pain 09/21/2021   Dysuria 08/03/2019   Chronic cough 12/20/2018   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   Status post total knee replacement, left 08/26/2018   Essential hypertension 02/23/2017   Adenocarcinoma of lung, stage 1 ([place]) 05/08/2014   S/P lobectomy of lung 02/12/2014   Asthma 01/30/2014    Multiple pulmonary nodules, largest R mid lung 06/29/2013   Cough variant asthma with component of uacs  05/31/2013    ONSET DATE: 01/28/2022   REFERRING DIAG: G81.91 (ICD-10-CM) - Right hemiparesis (HCC) I63.512 (ICD-10-CM) - Acute ischemic left MCA stroke (HCC)   THERAPY DIAG:  Muscle weakness (generalized)  Other abnormalities of gait and mobility  Hemiplegia and hemiparesis following cerebral infarction affecting right dominant side (HCC)  Rationale for Evaluation and Treatment Rehabilitation  SUBJECTIVE:                                                                                                                                                                                             SUBJECTIVE STATEMENT: Pt reports her R ankle was hurting her quite a bit yesterday due to the stormy weather (achy). Pt reports ankle feels better today but still achy. No falls since last visit.  Pt accompanied by: self (hired caregiver in lobby)  PERTINENT HISTORY: hx of hypertension, thrombotic stroke involving left middle cerebral artery, asthma, lung cancer, coronary calcification seen  on CT  and CKD   PAIN:  Are you having pain? Yes: NPRS scale: 5/10 Pain location: R ankle Pain description: achy Aggravating factors: walking Relieving factors: nothing  PRECAUTIONS: Fall  PATIENT GOALS "to be able to walk without help and without pain"  OBJECTIVE:   TODAY'S TREATMENT:  GAIT: Gait pattern: decreased step length- Right, decreased hip/knee flexion- Right, and decreased ankle dorsiflexion- Right Distance walked: various clinic distances Assistive device utilized: Walker - 4 wheeled Level of assistance: SBA  Gait pattern: decreased step length- Right, decreased hip/knee flexion- Right, and decreased ankle dorsiflexion- Right Distance walked: various clinic distances Assistive device utilized: None Level of assistance: CGA Comments: comparable speed, gait pattern, and balance for short distance gait with no AD as compared to rollator   THER ACT:   [facility] PT Assessment - 02/10/22 0936       Standardized Balance Assessment   Standardized Balance Assessment Berg Balance Test      Berg Balance Test   Sit to Stand Able to stand  independently using hands    Standing Unsupported Able to stand 2 minutes with supervision    Sitting with Back Unsupported but Feet Supported on Floor or Stool Able to sit safely and securely 2 minutes    Stand to Sit Sits safely with minimal use of hands    Transfers Able to transfer safely, minor use of hands    Standing Unsupported with Eyes Closed Able to stand 10 seconds with supervision    Standing Unsupported with Feet Together Needs help to attain position but able to stand for 30 seconds with feet together    From Standing, Reach Forward with Outstretched Arm Reaches forward but needs supervision    From Standing Position, Pick up Object from Floor Able to pick up shoe, needs supervision    From Standing Position, Turn to Look Behind Over each Shoulder Looks behind from both sides and weight shifts well    Turn 360 Degrees Able to turn  360 degrees safely but slowly    Standing Unsupported, Alternately Place Feet on Step/Stool Able to complete >2 steps/needs minimal assist    Standing Unsupported, One Foot in Front Needs help to step but can hold 15 seconds    Standing on One Leg Unable to try or needs assist to prevent fall    Total Score 34      Functional Gait  Assessment   Gait assessed  Yes    Gait Level Surface Walks 20 ft, slow speed, abnormal gait pattern, evidence for imbalance or deviates 10-15 in outside of the 12 in walkway width. Requires more than 7 sec to ambulate 20 ft.    Change in Gait Speed Able to change speed, demonstrates mild gait deviations, deviates 6-10 in outside of the 12 in walkway width, or no gait deviations, unable to achieve a major change in velocity, or uses a change in velocity, or uses an assistive device.    Gait with Horizontal Head Turns Performs head turns smoothly with slight change in gait velocity (eg, minor disruption to smooth gait path), deviates 6-10 in outside 12 in walkway width, or uses an assistive device.    Gait with Vertical Head Turns Performs task with slight change in gait velocity (eg, minor disruption to smooth gait path), deviates 6 - 10 in outside 12 in walkway width or uses assistive device    Gait and Pivot Turn Pivot turns safely in greater than 3 sec and stops with no loss  of balance, or pivot turns safely within 3 sec and stops with mild imbalance, requires small steps to catch balance.    Step Over Obstacle Is able to step over one shoe box (4.5 in total height) without changing gait speed. No evidence of imbalance.    Gait with Narrow Base of Support Ambulates less than 4 steps heel to toe or cannot perform without assistance.    Gait with Eyes Closed Walks 20 ft, uses assistive device, slower speed, mild gait deviations, deviates 6-10 in outside 12 in walkway width. Ambulates 20 ft in less than 9 sec but greater than 7 sec.    Ambulating Backwards Walks 20 ft,  slow speed, abnormal gait pattern, evidence for imbalance, deviates 10-15 in outside 12 in walkway width.    Steps Alternating feet, must use rail.    Total Score 16             Initiated HEP for balance and R ankle strengthening and ROM, see bolded below  PATIENT EDUCATION: Education details: findings of functional outcome measures, HEP Person educated: Patient Education method: Explanation and Demonstration Education comprehension: verbalized understanding   HOME EXERCISE PROGRAM: Access Code: [email or web address] URL: [email or web address] Date: 02/10/2022 Prepared by: Excell Seltzer  Exercises - Side Stepping with Counter Support  - 1 x daily - 7 x weekly - 3 sets - 10 reps - Forward Step Up with Counter Support  - 1 x daily - 7 x weekly - 3 sets - 10 reps - Towel Scrunches  - 1 x daily - 7 x weekly - 2 sets - 10 reps - Seated Ankle Alphabet  - 1 x daily - 7 x weekly - 1 sets - 10 reps   GOALS: Goals reviewed with patient? Yes  SHORT TERM GOALS: Target date: 03/03/2022  Initiate HEP Baseline: initiated 8/8 Goal status: MET  2.  Pt will improve gait velocity to at least 2.0 ft/sec for improved gait efficiency and performance at Supervision level with LRAD Baseline: 2.26 ft/sec S* with RW (8/1) Goal status: INITIAL  3.  Pt will improve normal TUG to less than or equal to 13 seconds for improved functional mobility and decreased fall risk. Baseline: 14 sec with RW (8/1) Goal status: INITIAL  4.  Pt will improve Berg score to 40/56  for decreased fall risk Baseline: 34/56 (8/8) Goal status: INITIAL  5.  Pt will improve 5 x STS to less than or equal to 12 seconds to demonstrate improved functional strength and transfer efficiency.  Baseline: 13.8 (8/1) Goal status: INITIAL   LONG TERM GOALS: Target date: 03/31/2022  Pt will be independent with balance and strengthening HEP for improved strength, balance, transfers and gait. Baseline:  Goal status:  INITIAL  2.  Pt will improve gait velocity to at least 1.5 ft/sec for improved gait efficiency and performance at mod I level with LRAD Baseline:  Goal status: INITIAL  3.  Pt will improve normal TUG to less than or equal to 12 seconds for improved functional mobility and decreased fall risk. Baseline: 14 sec with RW (8/1) Goal status: INITIAL  4.  Pt will improve Berg score to 46/56 for decreased fall risk Baseline: 34/56 (8/8) Goal status: INITIAL  5.  Pt will improve 5 x STS to less than or equal to 10 seconds to demonstrate improved functional strength and transfer efficiency.  Baseline: 13.8 sec (8/1) Goal status: INITIAL   ASSESSMENT:  CLINICAL IMPRESSION: Emphasis of skilled PT session on assessing several functional outcomes  measures to assess balance and fall risk. Patient demonstrates increased fall risk as noted by score of 34/56 on Berg Balance Scale.  (<36= high risk for falls, close to 100%; 37-45 significant >80%; 46-51 moderate >50%; 52-55 lower >25%). Reviewed score and functional implications. Also pt scores 16/30 on the FGA, indicating high fall risk. Also initiated HEP for balance training and R ankle strengthening and ROM, see bolded above. Due to indicated high fall risk from scores above pt would benefit from continued skilled PT services to address impairments, decreased fall risk, and increase independence with functional mobility. Continue POC.   OBJECTIVE IMPAIRMENTS Abnormal gait, decreased balance, decreased coordination, decreased mobility, decreased strength, decreased safety awareness, and pain.   ACTIVITY LIMITATIONS carrying, lifting, bending, squatting, stairs, transfers, bathing, dressing, and hygiene/grooming  PARTICIPATION LIMITATIONS: meal prep, cleaning, laundry, medication management, driving, shopping, community activity, yard work, and school  PERSONAL FACTORS hx of hypertension, thrombotic stroke involving left middle cerebral artery, asthma,  lung cancer, coronary calcification seen on CT and CKD  are also affecting patient's functional outcome.   REHAB POTENTIAL: Good  CLINICAL DECISION MAKING: Stable/uncomplicated  EVALUATION COMPLEXITY: Moderate  PLAN: PT FREQUENCY: 2x/week  PT DURATION: 8 weeks  PLANNED INTERVENTIONS: Therapeutic exercises, Therapeutic activity, Neuromuscular re-education, Balance training, Gait training, Patient/Family education, Self Care, Joint mobilization, Stair training, DME instructions, Electrical stimulation, Cryotherapy, Moist heat, Taping, Manual therapy, and Re-evaluation  PLAN FOR NEXT SESSION:  Trial gait with no AD, add to HEP for balance and strengthening, obstacle course, resisted step-ups     Excell Seltzer, PT, DPT, CSRS 02/10/2022, 1:17 PM

## 2022-02-10 NOTE — Therapy (Signed)
OUTPATIENT SPEECH LANGUAGE PATHOLOGY TREATMENT   Patient Name: [name] MRN: [id number] DOB:[birth year], 81 y.o., female Today's Date: 02/10/2022  PCP: none on file REFERRING PROVIDER: Courtney Heys, MD   End of Session - 02/10/22 0936     Visit Number 2    Number of Visits 17    Date for SLP Re-Evaluation 03/31/22    Progress Note Due on Visit 10    SLP Start Time 1015    SLP Stop Time  1100    SLP Time Calculation (min) 45 min    Activity Tolerance Patient tolerated treatment well             Past Medical History:  Diagnosis Date    Multiple pulmonary nodules, largest R mid lung 06/29/2013   Followed in Pulmonary clinic/ [facility]/ [place] - see CXR 06/27/13  - CT chest 07/14/2013 > 1. No acute findings in the thorax to account for the patient's  symptoms.  2. However, there is a subsolid nodule in the   right lower lobe that has a ground-glass attenuation component  measuring 2.5 x 1.8 cm, and a small central solid component  measuring 4 mm. Initial follow-up by chest CT without    Adenocarcinoma of lung, stage 1 ([place])    Status post right lower lobectomy August 2015   Anxiety    Asthma    Atrophic vaginitis    Chronic cough 12/20/2018   Chronic iritis of left eye    Pupil stays dilated; nonreactive   Chronic kidney disease, stage 3b (HCC)    Colon polyp    Contact lens/glasses fitting    wears contacts or glasses   Cough variant asthma with component of uacs  05/31/2013   Followed in Pulmonary clinic/ [facility]/ [facility] in her 67's - Spirometry 02/23/13 wnl  - 07/03/2013 Sinus CT > Mild chronic sinus disease - No acute findings. Left-to-right nasal septal deviation of 3 mm. -med calendar 08/08/13 , redone 06/01/2016 and 02/22/2017  - Eos 4%   10/13/2013  > rec singulair daily  - FENO 05/12/2016  =   24 on singulair  - Allergy profile 05/12/2016 >   IgE  47 pos   Depression    Depression with anxiety    Dysuria 08/03/2019   Environmental allergies     Essential hypertension 02/23/2017   Changed losartan to avapro 02/22/2017 due to cough    GERD (gastroesophageal reflux disease)    Hypertension    Hypertriglyceridemia    Kidney stones    OA (osteoarthritis)    Osteopenia    PONV (postoperative nausea and vomiting)    S/P lobectomy of lung 02/12/2014   Status post total knee replacement, left 08/26/2018   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   Past Surgical History:  Procedure Laterality Date   APPENDECTOMY     COLONOSCOPY     EYE SURGERY Left    cataract removal   ORIF WRIST FRACTURE Left 08/04/2013   Procedure: OPEN REDUCTION INTERNAL FIXATION (ORIF) LEFT DISTAL RADIUS WRIST FRACTURE;  Surgeon: Wynonia Sours, MD;  Location: [place];  Service: Orthopedics;  Laterality: Left;   PARTIAL HYSTERECTOMY     TOTAL KNEE ARTHROPLASTY Left 08/26/2018   Procedure: TOTAL KNEE ARTHROPLASTY;  Surgeon: Netta Cedars, MD;  [facility];  Service: Orthopedics;  Laterality: Left;   VIDEO ASSISTED THORACOSCOPY (VATS)/WEDGE RESECTION Right 02/12/2014   Procedure: RIGHT VIDEO ASSISTED THORACOSCOPY RIGHT LOWER LOBE LUNG /WEDGE RESECTION,RIGHT THORACOTOMY WITH RIGHT LOWER  LOBE LOBECTOMY & NODE DISSECTION;  Surgeon: Melrose Nakayama, MD;  Location: [place];  Service: Thoracic;  Laterality: Right;   VIDEO ASSISTED THORACOSCOPY (VATS)/WEDGE RESECTION Right 02/12/2014   VIDEO BRONCHOSCOPY N/A 02/12/2014   Procedure: VIDEO BRONCHOSCOPY;  Surgeon: Melrose Nakayama, MD;  Location: [place];  Service: Thoracic;  Laterality: N/A;   Patient Active Problem List   Diagnosis Date Noted   Acute ischemic left MCA stroke ([place]) 01/28/2022   Aphasia as late effect of cerebrovascular accident (CVA) 01/28/2022   Right hemiparesis ([place]) 01/28/2022   Abnormal gait due to muscle weakness 01/28/2022   Insomnia 10/13/2021   Frequency of urination 10/13/2021   Vitamin D deficiency 10/13/2021   Chronic renal failure 10/13/2021   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[place]) 09/24/2021   Aphasia due to acute cerebrovascular accident (CVA) ([place]) 09/21/2021   Chronic kidney disease, stage 3b ([place]) 09/21/2021   Depression with anxiety 09/21/2021   Hypertension 09/21/2021   Asthma, chronic 09/21/2021   Fall at home, initial encounter 09/21/2021   Right ankle pain 09/21/2021   Dysuria 08/03/2019   Chronic cough 12/20/2018   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   Status post total knee replacement, left 08/26/2018   Essential hypertension 02/23/2017   Adenocarcinoma of lung, stage 1 ([place]) 05/08/2014   S/P lobectomy of lung 02/12/2014   Asthma 01/30/2014    Multiple pulmonary nodules, largest R mid lung 06/29/2013   Cough variant asthma with component of uacs  05/31/2013    ONSET DATE: March 2023, referral July 2023   REFERRING DIAG:  I69.320 (ICD-10-CM) - Aphasia as late effect of cerebrovascular accident (CVA)  I63.512 (ICD-10-CM) - Acute ischemic left MCA stroke ([place])    THERAPY DIAG:  Cognitive communication deficit  Aphasia  Apraxia  Rationale for Evaluation and Treatment Rehabilitation  SUBJECTIVE:   SUBJECTIVE STATEMENT: "Dong good I think"   PAIN:  Are you having pain? No   OBJECTIVE:  TODAY'S TREATMENT:  02-10-22: SLP provided instruction on anomia strategies with focus on giving self extra time, trying to avoid frsutration, and using description. Faciliated describing through SFA in which pt was able to name and describe x2 presented photos with usual mod-A to provide salient details in description.Given usual extended time, pt demonstrating robust vocabulary and intermittent impact of paraphasias. Given mod-A, pt able to name x5 things she would do when going to a horse show and x4 things one may do on a beach vacation.   02-03-22: Trial cues to assess most facilitative context for pt, appears phonemic cueing most helpful during today's session. Rare success with description cues to overcome  anomia. SLP provides education on evaluation results, SLP recommendations, POC, and goals. Pt provided opportunity to ask all questions and all are answered to pt satisfaction at this time.    PATIENT EDUCATION: Education details: see above Person educated: Patient Education method: Customer service manager Education comprehension: verbalized understanding, returned demonstration, and needs further education   GOALS: Goals reviewed with patient? Yes  SHORT TERM GOALS: Target date: 03/03/22  Pt will verbalize 7 items in personally relevant categories with occasional min-A over 2 therapy sessions Baseline: Goal status: IN PROGRESS  2.  Pt will correct semantic paraphasia given usual mod-A in 70% of opportunities during structured task over 2 sessions.  Baseline:  Goal status: IN PROGRESS  3.  Pt will write x5 item list with 80% accuracy, given usual mod-A over 2 sessions.  Baseline:  Goal status: IN PROGRESS  4.  Pt will successfully use trained anomia compensation in x3 opportunities across 2 therapy sessions with occasioanl min-A.  Baseline:  Goal status: IN PROGRESS   LONG TERM GOALS: Target date: 03/31/2022  Pt will identify, and correct, 50% of semantic paraphasias during structured task with occasional min-A over 2 sessions.  Baseline:  Goal status: IN PROGRESS  2.  Pt will report improvement of 2 points via CPIB PROM by d/c Baseline: 15 Goal status: IN PROGRESS  3.  Pt will carryover dysnomia compensations to conversational speech, resulting in strategy use in 70% of opportunities over 15 minute conversation Baseline:  Goal status: IN PROGRESS  4.  Pt will write x10 phrases, with mod-I, with 80% accuracy over 2 sessions.   Baseline:  Goal status: INITIAL  ASSESSMENT:  CLINICAL IMPRESSION: Patient is a 81 y.o. F who was seen today for aphasia evaluation 2/2 stroke. Pt presenting with mild-moderate expressive aphasia. Receptive language skills appear to be  within gross normal limits. Spontaneous speech is characterized by usual vague language, anomia and frequent paraphasias impacting clarity and cohesiveness of message. Limited awareness of paraphasias exhibited during evaluation. Trial of cueing from SLP demonstrating pt is stimulable for phonemic and phrase completion to facilitate word finding. Repetition of paraphasia intermittently improved awareness of errors. Written expression additionally impaired, with improved awareness. Unable to write self-generate sentence and reports desire to work on this area. Reports to having successful communication with family and close friends with increased difficulty when out in community. Below normal limits on confrontation naming task, unable to recall names of caregivers. Requires prolonged time to verbalize church's name. SLP probes cognition, however limited insight or needs ID d/t communication impairment and pt attending session alone. SLP will continue to monitor. I recommend skilled ST to address expressive aphasia to improve QoL and increase communication effectiveness across communication partners and while out in community.   OBJECTIVE IMPAIRMENTS include expressive language, aphasia, and apraxia. These impairments are limiting patient from effectively communicating at home and in community. Factors affecting potential to achieve goals and functional outcome are family/community support. Patient will benefit from skilled SLP services to address above impairments and improve overall function.  REHAB POTENTIAL: Good  PLAN: SLP FREQUENCY: 2x/week  SLP DURATION: 8 weeks  PLANNED INTERVENTIONS: Language facilitation, Cueing hierachy, Internal/external aids, Functional tasks, Multimodal communication approach, SLP instruction and feedback, Compensatory strategies, and Patient/family education    Su Monks, CCC-SLP 02/10/2022, 9:37 AM

## 2022-02-12 NOTE — Therapy (Signed)
OUTPATIENT SPEECH LANGUAGE PATHOLOGY TREATMENT   Patient Name: [name] MRN: [id number] DOB:January 15, 1941, 81 y.o., female Today's Date: 02/13/2022  PCP: none on file REFERRING PROVIDER: Courtney Heys, MD   End of Session - 02/13/22 1354     Visit Number 3    Number of Visits 17    Date for SLP Re-Evaluation 03/31/22    Progress Note Due on Visit 20    SLP Start Time 1400    SLP Stop Time  1445    SLP Time Calculation (min) 45 min    Activity Tolerance Patient tolerated treatment well              Past Medical History:  Diagnosis Date    Multiple pulmonary nodules, largest R mid lung 06/29/2013   Followed in Pulmonary clinic/ [facility]/ [place] - see CXR 06/27/13  - CT chest 07/14/2013 > 1. No acute findings in the thorax to account for the patient's  symptoms.  2. However, there is a subsolid nodule in the   right lower lobe that has a ground-glass attenuation component  measuring 2.5 x 1.8 cm, and a small central solid component  measuring 4 mm. Initial follow-up by chest CT without    Adenocarcinoma of lung, stage 1 ([place])    Status post right lower lobectomy August 2015   Anxiety    Asthma    Atrophic vaginitis    Chronic cough 12/20/2018   Chronic iritis of left eye    Pupil stays dilated; nonreactive   Chronic kidney disease, stage 3b (HCC)    Colon polyp    Contact lens/glasses fitting    wears contacts or glasses   Cough variant asthma with component of uacs  05/31/2013   Followed in Pulmonary clinic/ [facility]/ [place] in her 35's - Spirometry 02/23/13 wnl  - 07/03/2013 Sinus CT > Mild chronic sinus disease - No acute findings. Left-to-right nasal septal deviation of 3 mm. -med calendar 08/08/13 , redone 06/01/2016 and 02/22/2017  - Eos 4%   10/13/2013  > rec singulair daily  - FENO 05/12/2016  =   24 on singulair  - Allergy profile 05/12/2016 >   IgE  47 pos   Depression    Depression with anxiety    Dysuria 08/03/2019   Environmental allergies     Essential hypertension 02/23/2017   Changed losartan to avapro 02/22/2017 due to cough    GERD (gastroesophageal reflux disease)    Hypertension    Hypertriglyceridemia    Kidney stones    OA (osteoarthritis)    Osteopenia    PONV (postoperative nausea and vomiting)    S/P lobectomy of lung 02/12/2014   Status post total knee replacement, left 08/26/2018   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   Past Surgical History:  Procedure Laterality Date   APPENDECTOMY     COLONOSCOPY     EYE SURGERY Left    cataract removal   ORIF WRIST FRACTURE Left 08/04/2013   Procedure: OPEN REDUCTION INTERNAL FIXATION (ORIF) LEFT DISTAL RADIUS WRIST FRACTURE;  Surgeon: Wynonia Sours, MD;  Location: [place];  Service: Orthopedics;  Laterality: Left;   PARTIAL HYSTERECTOMY     TOTAL KNEE ARTHROPLASTY Left 08/26/2018   Procedure: TOTAL KNEE ARTHROPLASTY;  Surgeon: Netta Cedars, MD;  Location[facility];  Service: Orthopedics;  Laterality: Left;   VIDEO ASSISTED THORACOSCOPY (VATS)/WEDGE RESECTION Right 02/12/2014   Procedure: RIGHT VIDEO ASSISTED THORACOSCOPY RIGHT LOWER LOBE LUNG /WEDGE RESECTION,RIGHT THORACOTOMY WITH RIGHT  LOWER LOBE LOBECTOMY & NODE DISSECTION;  Surgeon: Melrose Nakayama, MD;  Location: [place];  Service: Thoracic;  Laterality: Right;   VIDEO ASSISTED THORACOSCOPY (VATS)/WEDGE RESECTION Right 02/12/2014   VIDEO BRONCHOSCOPY N/A 02/12/2014   Procedure: VIDEO BRONCHOSCOPY;  Surgeon: Melrose Nakayama, MD;  Location: [place];  Service: Thoracic;  Laterality: N/A;   Patient Active Problem List   Diagnosis Date Noted   Acute ischemic left MCA stroke ([place]) 01/28/2022   Aphasia as late effect of cerebrovascular accident (CVA) 01/28/2022   Right hemiparesis ([place]) 01/28/2022   Abnormal gait due to muscle weakness 01/28/2022   Insomnia 10/13/2021   Frequency of urination 10/13/2021   Vitamin D deficiency 10/13/2021   Chronic renal failure 10/13/2021   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[place]) 09/24/2021   Aphasia due to acute cerebrovascular accident (CVA) ([place]) 09/21/2021   Chronic kidney disease, stage 3b ([place]) 09/21/2021   Depression with anxiety 09/21/2021   Hypertension 09/21/2021   Asthma, chronic 09/21/2021   Fall at home, initial encounter 09/21/2021   Right ankle pain 09/21/2021   Dysuria 08/03/2019   Chronic cough 12/20/2018   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   Status post total knee replacement, left 08/26/2018   Essential hypertension 02/23/2017   Adenocarcinoma of lung, stage 1 ([place]) 05/08/2014   S/P lobectomy of lung 02/12/2014   Asthma 01/30/2014    Multiple pulmonary nodules, largest R mid lung 06/29/2013   Cough variant asthma with component of uacs  05/31/2013    ONSET DATE: March 2023, referral July 2023   REFERRING DIAG:  I69.320 (ICD-10-CM) - Aphasia as late effect of cerebrovascular accident (CVA)  I63.512 (ICD-10-CM) - Acute ischemic left MCA stroke ([place])    THERAPY DIAG:  Cognitive communication deficit  Apraxia  Aphasia  Rationale for Evaluation and Treatment Rehabilitation  SUBJECTIVE:   SUBJECTIVE STATEMENT: Pt reports all is going well, no changes to report.   PAIN:  Are you having pain? Yes: NPRS scale: 3/10 Pain location: R ankle   OBJECTIVE:  TODAY'S TREATMENT:  02-13-22: SLP led pt through response elaboration task in which pt described pictures and answered wh- questions to aid in using robust language to name and describe. Pt requiring usual mod-A to fully describe but naming items in photos with only rare min-A. Using variety of word types including verbs, nouns, adjectives during today's task. Self-awareness and attempt to correct paraphasias x4 during today's sessions. SLP provides visual cues for use of targeted anomia strategies: giving time, describing, and using similar word. Requires usual verbal -A for implementation of strategies when anomia occurs.   02-10-22:  SLP provided instruction on anomia strategies with focus on giving self extra time, trying to avoid frsutration, and using description. Faciliated describing through SFA in which pt was able to name and describe x2 presented photos with usual mod-A to provide salient details in description.Given usual extended time, pt demonstrating robust vocabulary and intermittent impact of paraphasias. Given mod-A, pt able to name x5 things she would do when going to a horse show and x4 things one may do on a beach vacation.   02-03-22: Trial cues to assess most facilitative context for pt, appears phonemic cueing most helpful during today's session. Rare success with description cues to overcome anomia. SLP provides education on evaluation results, SLP recommendations, POC, and goals. Pt provided opportunity to ask all questions and all are answered to pt satisfaction at this time.    PATIENT EDUCATION: Education details: see above  Person educated: Patient Education method: Customer service manager Education comprehension: verbalized understanding, returned demonstration, and needs further education   GOALS: Goals reviewed with patient? Yes  SHORT TERM GOALS: Target date: 03/03/22  Pt will verbalize 7 items in personally relevant categories with occasional min-A over 2 therapy sessions Baseline: Goal status: IN PROGRESS  2.  Pt will correct semantic paraphasia given usual mod-A in 70% of opportunities during structured task over 2 sessions.  Baseline:  Goal status: IN PROGRESS  3.  Pt will write x5 item list with 80% accuracy, given usual mod-A over 2 sessions.  Baseline:  Goal status: IN PROGRESS  4.  Pt will successfully use trained anomia compensation in x3 opportunities across 2 therapy sessions with occasioanl min-A.  Baseline:  Goal status: IN PROGRESS   LONG TERM GOALS: Target date: 03/31/2022  Pt will identify, and correct, 50% of semantic paraphasias during structured task with  occasional min-A over 2 sessions.  Baseline:  Goal status: IN PROGRESS  2.  Pt will report improvement of 2 points via CPIB PROM by d/c Baseline: 15 Goal status: IN PROGRESS  3.  Pt will carryover dysnomia compensations to conversational speech, resulting in strategy use in 70% of opportunities over 15 minute conversation Baseline:  Goal status: IN PROGRESS  4.  Pt will write x10 phrases, with mod-I, with 80% accuracy over 2 sessions.   Baseline:  Goal status: INITIAL  ASSESSMENT:  CLINICAL IMPRESSION: Patient is a 81 y.o. F who was seen today for aphasia evaluation 2/2 stroke. Pt presenting with mild-moderate expressive aphasia. Receptive language skills appear to be within gross normal limits. Spontaneous speech is characterized by usual vague language, anomia and frequent paraphasias impacting clarity and cohesiveness of message. Limited awareness of paraphasias exhibited during evaluation. Trial of cueing from SLP demonstrating pt is stimulable for phonemic and phrase completion to facilitate word finding. Repetition of paraphasia intermittently improved awareness of errors. Written expression additionally impaired, with improved awareness. Unable to write self-generate sentence and reports desire to work on this area. Reports to having successful communication with family and close friends with increased difficulty when out in community. Below normal limits on confrontation naming task, unable to recall names of caregivers. Requires prolonged time to verbalize church's name. SLP probes cognition, however limited insight or needs ID d/t communication impairment and pt attending session alone. SLP will continue to monitor. I recommend skilled ST to address expressive aphasia to improve QoL and increase communication effectiveness across communication partners and while out in community.   OBJECTIVE IMPAIRMENTS include expressive language, aphasia, and apraxia. These impairments are limiting  patient from effectively communicating at home and in community. Factors affecting potential to achieve goals and functional outcome are family/community support. Patient will benefit from skilled SLP services to address above impairments and improve overall function.  REHAB POTENTIAL: Good  PLAN: SLP FREQUENCY: 2x/week  SLP DURATION: 8 weeks  PLANNED INTERVENTIONS: Language facilitation, Cueing hierachy, Internal/external aids, Functional tasks, Multimodal communication approach, SLP instruction and feedback, Compensatory strategies, and Patient/family education    Su Monks, CCC-SLP 02/13/2022, 1:55 PM

## 2022-02-13 ENCOUNTER — Ambulatory Visit: Payer: Medicare Other | Admitting: Speech Pathology

## 2022-02-13 ENCOUNTER — Ambulatory Visit: Payer: Medicare Other | Admitting: Physical Therapy

## 2022-02-13 ENCOUNTER — Encounter: Payer: Self-pay | Admitting: Physical Therapy

## 2022-02-13 DIAGNOSIS — I69351 Hemiplegia and hemiparesis following cerebral infarction affecting right dominant side: Secondary | ICD-10-CM | POA: Diagnosis not present

## 2022-02-13 DIAGNOSIS — R482 Apraxia: Secondary | ICD-10-CM

## 2022-02-13 DIAGNOSIS — R2689 Other abnormalities of gait and mobility: Secondary | ICD-10-CM | POA: Diagnosis not present

## 2022-02-13 DIAGNOSIS — R41841 Cognitive communication deficit: Secondary | ICD-10-CM

## 2022-02-13 DIAGNOSIS — R4701 Aphasia: Secondary | ICD-10-CM

## 2022-02-13 DIAGNOSIS — M6281 Muscle weakness (generalized): Secondary | ICD-10-CM

## 2022-02-13 DIAGNOSIS — G8191 Hemiplegia, unspecified affecting right dominant side: Secondary | ICD-10-CM | POA: Diagnosis not present

## 2022-02-13 DIAGNOSIS — I63512 Cerebral infarction due to unspecified occlusion or stenosis of left middle cerebral artery: Secondary | ICD-10-CM | POA: Diagnosis not present

## 2022-02-13 DIAGNOSIS — I69190 Apraxia following nontraumatic intracerebral hemorrhage: Secondary | ICD-10-CM | POA: Diagnosis not present

## 2022-02-13 NOTE — Therapy (Signed)
OUTPATIENT PHYSICAL THERAPY NEURO THERAPY   Patient Name: [name] MRN: [id number] DOB:01/16/1941, 81 y.o., female Today's Date: 02/13/2022   PCP: Pt unable to provide information and unable to locate in chart. REFERRING PROVIDER: Courtney Heys, MD    PT End of Session - 02/13/22 1317     Visit Number 3    Number of Visits 17   plus eval   Date for PT Re-Evaluation 03/31/22    Authorization Type [employer] Medicare    Progress Note Due on Visit 10    PT Start Time 1315    PT Stop Time 1358    PT Time Calculation (min) 43 min    Equipment Utilized During Treatment Gait belt    Activity Tolerance Patient tolerated treatment well    Behavior During Therapy WFL for tasks assessed/performed               Past Medical History:  Diagnosis Date    Multiple pulmonary nodules, largest R mid lung 06/29/2013   Followed in Pulmonary clinic/ [facility]/ [place] - see CXR 06/27/13  - CT chest 07/14/2013 > 1. No acute findings in the thorax to account for the patient's  symptoms.  2. However, there is a subsolid nodule in the   right lower lobe that has a ground-glass attenuation component  measuring 2.5 x 1.8 cm, and a small central solid component  measuring 4 mm. Initial follow-up by chest CT without    Adenocarcinoma of lung, stage 1 ([place])    Status post right lower lobectomy August 2015   Anxiety    Asthma    Atrophic vaginitis    Chronic cough 12/20/2018   Chronic iritis of left eye    Pupil stays dilated; nonreactive   Chronic kidney disease, stage 3b (HCC)    Colon polyp    Contact lens/glasses fitting    wears contacts or glasses   Cough variant asthma with component of uacs  05/31/2013   Followed in Pulmonary clinic/ [facility]/ [place] [facility] in her 18's - Spirometry 02/23/13 wnl  - 07/03/2013 Sinus CT > Mild chronic sinus disease - No acute findings. Left-to-right nasal septal deviation of 3 mm. -med calendar 08/08/13 , redone 06/01/2016 and 02/22/2017  - Eos 4%    10/13/2013  > rec singulair daily  - FENO 05/12/2016  =   24 on singulair  - Allergy profile 05/12/2016 >   IgE  47 pos   Depression    Depression with anxiety    Dysuria 08/03/2019   Environmental allergies    Essential hypertension 02/23/2017   Changed losartan to avapro 02/22/2017 due to cough    GERD (gastroesophageal reflux disease)    Hypertension    Hypertriglyceridemia    Kidney stones    OA (osteoarthritis)    Osteopenia    PONV (postoperative nausea and vomiting)    S/P lobectomy of lung 02/12/2014   Status post total knee replacement, left 08/26/2018   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   Past Surgical History:  Procedure Laterality Date   APPENDECTOMY     COLONOSCOPY     EYE SURGERY Left    cataract removal   ORIF WRIST FRACTURE Left 08/04/2013   Procedure: OPEN REDUCTION INTERNAL FIXATION (ORIF) LEFT DISTAL RADIUS WRIST FRACTURE;  Surgeon: Wynonia Sours, MD;  Location: [place];  Service: Orthopedics;  Laterality: Left;   PARTIAL HYSTERECTOMY     TOTAL KNEE ARTHROPLASTY Left 08/26/2018   Procedure: TOTAL KNEE ARTHROPLASTY;  Surgeon: Netta Cedars, MD;  [facility];  Service: Orthopedics;  Laterality: Left;   VIDEO ASSISTED THORACOSCOPY (VATS)/WEDGE RESECTION Right 02/12/2014   Procedure: RIGHT VIDEO ASSISTED THORACOSCOPY RIGHT LOWER LOBE LUNG /WEDGE RESECTION,RIGHT THORACOTOMY WITH RIGHT LOWER LOBE LOBECTOMY & NODE DISSECTION;  Surgeon: Melrose Nakayama, MD;  Location: [place];  Service: Thoracic;  Laterality: Right;   VIDEO ASSISTED THORACOSCOPY (VATS)/WEDGE RESECTION Right 02/12/2014   VIDEO BRONCHOSCOPY N/A 02/12/2014   Procedure: VIDEO BRONCHOSCOPY;  Surgeon: Melrose Nakayama, MD;  Location: [place];  Service: Thoracic;  Laterality: N/A;   Patient Active Problem List   Diagnosis Date Noted   Acute ischemic left MCA stroke ([place]) 01/28/2022   Aphasia as late effect of cerebrovascular accident (CVA) 01/28/2022   Right hemiparesis ([place]) 01/28/2022    Abnormal gait due to muscle weakness 01/28/2022   Insomnia 10/13/2021   Frequency of urination 10/13/2021   Vitamin D deficiency 10/13/2021   Chronic renal failure 10/13/2021   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[place]) 09/24/2021   Aphasia due to acute cerebrovascular accident (CVA) ([place]) 09/21/2021   Chronic kidney disease, stage 3b ([place]) 09/21/2021   Depression with anxiety 09/21/2021   Hypertension 09/21/2021   Asthma, chronic 09/21/2021   Fall at home, initial encounter 09/21/2021   Right ankle pain 09/21/2021   Dysuria 08/03/2019   Chronic cough 12/20/2018   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   Status post total knee replacement, left 08/26/2018   Essential hypertension 02/23/2017   Adenocarcinoma of lung, stage 1 ([place]) 05/08/2014   S/P lobectomy of lung 02/12/2014   Asthma 01/30/2014    Multiple pulmonary nodules, largest R mid lung 06/29/2013   Cough variant asthma with component of uacs  05/31/2013    ONSET DATE: 01/28/2022   REFERRING DIAG: G81.91 (ICD-10-CM) - Right hemiparesis (HCC) I63.512 (ICD-10-CM) - Acute ischemic left MCA stroke (HCC)   THERAPY DIAG:  Muscle weakness (generalized)  Other abnormalities of gait and mobility  Hemiplegia and hemiparesis following cerebral infarction affecting right dominant side (HCC)  Rationale for Evaluation and Treatment Rehabilitation  SUBJECTIVE:                                                                                                                                                                                             SUBJECTIVE STATEMENT: No changes since last visit. No pain and no falls. Pt reports she has been able to do her HEP one time and it went fine but has too busy to work on it more than that.  Pt accompanied by: self   PERTINENT HISTORY: hx of hypertension, thrombotic stroke  involving left middle cerebral artery, asthma, lung cancer, coronary calcification seen on CT  and CKD   PAIN:  Are you having pain? Yes: NPRS scale: 3/10 Pain location: R ankle Pain description: achy Aggravating factors: walking Relieving factors: nothing  PRECAUTIONS: Fall  PATIENT GOALS "to be able to walk without help and without pain"  OBJECTIVE:   TODAY'S TREATMENT:   THER ACT: Seated R ankle forward/backward rocking on balance board x 15 reps Seated R ankle inversion/eversion rock on balance board x 15 reps Attempted circles on balance board but pt exhibits difficulty motor planning  Standing R/L resisted step-ups onto 6" step with BUE support with red theraband, 2 x 15 reps  Standing alt L/R Bosu ball step-ups in // bars, x 10 reps each  Sidesteps L/R in // bars on foam 2 x 10 ft with cues for increased LE clearance  Backwards walking in // bars 4 x 10 ft with CGA for balance, cues for increased step length and to widen BOS   PATIENT EDUCATION: Education details: continue HEP Person educated: Patient Education method: Explanation and Demonstration Education comprehension: verbalized understanding   HOME EXERCISE PROGRAM: Access Code: [email or web address] URL: [email or web address] Date: 02/10/2022 Prepared by: Excell Seltzer  Exercises - Side Stepping with Counter Support  - 1 x daily - 7 x weekly - 3 sets - 10 reps - Forward Step Up with Counter Support  - 1 x daily - 7 x weekly - 3 sets - 10 reps - Towel Scrunches  - 1 x daily - 7 x weekly - 2 sets - 10 reps - Seated Ankle Alphabet  - 1 x daily - 7 x weekly - 1 sets - 10 reps   GOALS: Goals reviewed with patient? Yes  SHORT TERM GOALS: Target date: 03/03/2022  Initiate HEP Baseline: initiated 8/8 Goal status: MET  2.  Pt will improve gait velocity to at least 2.5 ft/sec for improved gait efficiency and performance at Supervision level with LRAD Baseline: 2.26 ft/sec S* with RW (8/1) Goal status: REVISED  3.  Pt will improve normal TUG to less than or equal to 13 seconds for improved  functional mobility and decreased fall risk. Baseline: 14 sec with RW (8/1) Goal status: INITIAL  4.  Pt will improve Berg score to 40/56  for decreased fall risk Baseline: 34/56 (8/8) Goal status: INITIAL  5.  Pt will improve 5 x STS to less than or equal to 12 seconds to demonstrate improved functional strength and transfer efficiency.  Baseline: 13.8 (8/1) Goal status: INITIAL   LONG TERM GOALS: Target date: 03/31/2022  Pt will be independent with balance and strengthening HEP for improved strength, balance, transfers and gait. Baseline:  Goal status: INITIAL  2.  Pt will improve gait velocity to at least 3.0 ft/sec for improved gait efficiency and performance at mod I level with LRAD Baseline: 2.26 ft/sec S* with RW (8/1) Goal status: REVISED  3.  Pt will improve normal TUG to less than or equal to 12 seconds for improved functional mobility and decreased fall risk. Baseline: 14 sec with RW (8/1) Goal status: INITIAL  4.  Pt will improve Berg score to 46/56 for decreased fall risk Baseline: 34/56 (8/8) Goal status: INITIAL  5.  Pt will improve 5 x STS to less than or equal to 10 seconds to demonstrate improved functional strength and transfer efficiency.  Baseline: 13.8 sec (8/1) Goal status: INITIAL   ASSESSMENT:  CLINICAL IMPRESSION: Emphasis of skilled PT session on working  on R ankle ROM and strengthening and balance training. Pt continues to exhibit decreased stability in R ankle as compared to L ankle and decreased strength in RLE as compared to LLE. Pt continues to benefit from skilled therapy services to address balance and strength impairments and reduce fall risk. Continue POC.   OBJECTIVE IMPAIRMENTS Abnormal gait, decreased balance, decreased coordination, decreased mobility, decreased strength, decreased safety awareness, and pain.   ACTIVITY LIMITATIONS carrying, lifting, bending, squatting, stairs, transfers, bathing, dressing, and  hygiene/grooming  PARTICIPATION LIMITATIONS: meal prep, cleaning, laundry, medication management, driving, shopping, community activity, yard work, and school  PERSONAL FACTORS hx of hypertension, thrombotic stroke involving left middle cerebral artery, asthma, lung cancer, coronary calcification seen on CT and CKD  are also affecting patient's functional outcome.   REHAB POTENTIAL: Good  CLINICAL DECISION MAKING: Stable/uncomplicated  EVALUATION COMPLEXITY: Moderate  PLAN: PT FREQUENCY: 2x/week  PT DURATION: 8 weeks  PLANNED INTERVENTIONS: Therapeutic exercises, Therapeutic activity, Neuromuscular re-education, Balance training, Gait training, Patient/Family education, Self Care, Joint mobilization, Stair training, DME instructions, Electrical stimulation, Cryotherapy, Moist heat, Taping, Manual therapy, and Re-evaluation  PLAN FOR NEXT SESSION:  Trial gait with no AD, add to HEP for balance and strengthening, obstacle course, LE coordination       Excell Seltzer, PT, DPT, CSRS 02/13/2022, 3:22 PM

## 2022-02-13 NOTE — Patient Instructions (Addendum)
Home practice:  Get out photo albums or magazine. Practice describing what is going on in pictures.  What things do I see?   Describe what things look like-- what are the attributes?   What are people doing?  Where is it?   What season is it?   How might the people be feelings?    We canceled next Wednesday's, 02/18/22, [place] appointment. You will still come for physical therapy.

## 2022-02-18 ENCOUNTER — Encounter: Payer: Medicare Other | Admitting: Speech Pathology

## 2022-02-18 ENCOUNTER — Ambulatory Visit: Payer: Medicare Other | Admitting: Physical Therapy

## 2022-02-18 ENCOUNTER — Encounter: Payer: Self-pay | Admitting: Physical Therapy

## 2022-02-18 DIAGNOSIS — I69190 Apraxia following nontraumatic intracerebral hemorrhage: Secondary | ICD-10-CM | POA: Diagnosis not present

## 2022-02-18 DIAGNOSIS — R2689 Other abnormalities of gait and mobility: Secondary | ICD-10-CM | POA: Diagnosis not present

## 2022-02-18 DIAGNOSIS — R4701 Aphasia: Secondary | ICD-10-CM | POA: Diagnosis not present

## 2022-02-18 DIAGNOSIS — I63512 Cerebral infarction due to unspecified occlusion or stenosis of left middle cerebral artery: Secondary | ICD-10-CM | POA: Diagnosis not present

## 2022-02-18 DIAGNOSIS — M6281 Muscle weakness (generalized): Secondary | ICD-10-CM | POA: Diagnosis not present

## 2022-02-18 DIAGNOSIS — R482 Apraxia: Secondary | ICD-10-CM | POA: Diagnosis not present

## 2022-02-18 DIAGNOSIS — G8191 Hemiplegia, unspecified affecting right dominant side: Secondary | ICD-10-CM | POA: Diagnosis not present

## 2022-02-18 DIAGNOSIS — I69351 Hemiplegia and hemiparesis following cerebral infarction affecting right dominant side: Secondary | ICD-10-CM | POA: Diagnosis not present

## 2022-02-18 NOTE — Therapy (Signed)
OUTPATIENT PHYSICAL THERAPY NEURO THERAPY   Patient Name: [name] MRN: [id number] DOB:12/29/1940, 81 y.o., female Today's Date: 02/18/2022   PCP: Pt unable to provide information and unable to locate in chart. REFERRING PROVIDER: Courtney Heys, MD    PT End of Session - 02/18/22 1318     Visit Number 4    Number of Visits 17   plus eval   Date for PT Re-Evaluation 03/31/22    Authorization Type U[employer] Medicare    Progress Note Due on Visit 10    PT Start Time 1315    PT Stop Time 1358    PT Time Calculation (min) 43 min    Equipment Utilized During Treatment Gait belt    Activity Tolerance Patient tolerated treatment well    Behavior During Therapy WFL for tasks assessed/performed                Past Medical History:  Diagnosis Date    Multiple pulmonary nodules, largest R mid lung 06/29/2013   Followed in Pulmonary clinic/ [facility]/ [place] - see CXR 06/27/13  - CT chest 07/14/2013 > 1. No acute findings in the thorax to account for the patient's  symptoms.  2. However, there is a subsolid nodule in the   right lower lobe that has a ground-glass attenuation component  measuring 2.5 x 1.8 cm, and a small central solid component  measuring 4 mm. Initial follow-up by chest CT without    Adenocarcinoma of lung, stage 1 ([place])    Status post right lower lobectomy August 2015   Anxiety    Asthma    Atrophic vaginitis    Chronic cough 12/20/2018   Chronic iritis of left eye    Pupil stays dilated; nonreactive   Chronic kidney disease, stage 3b (HCC)    Colon polyp    Contact lens/glasses fitting    wears contacts or glasses   Cough variant asthma with component of uacs  05/31/2013   Followed in Pulmonary clinic/ [facility]/ [place] in her 73's - Spirometry 02/23/13 wnl  - 07/03/2013 Sinus CT > Mild chronic sinus disease - No acute findings. Left-to-right nasal septal deviation of 3 mm. -med calendar 08/08/13 , redone 06/01/2016 and 02/22/2017  - Eos 4%    10/13/2013  > rec singulair daily  - FENO 05/12/2016  =   24 on singulair  - Allergy profile 05/12/2016 >   IgE  47 pos   Depression    Depression with anxiety    Dysuria 08/03/2019   Environmental allergies    Essential hypertension 02/23/2017   Changed losartan to avapro 02/22/2017 due to cough    GERD (gastroesophageal reflux disease)    Hypertension    Hypertriglyceridemia    Kidney stones    OA (osteoarthritis)    Osteopenia    PONV (postoperative nausea and vomiting)    S/P lobectomy of lung 02/12/2014   Status post total knee replacement, left 08/26/2018   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   Past Surgical History:  Procedure Laterality Date   APPENDECTOMY     COLONOSCOPY     EYE SURGERY Left    cataract removal   ORIF WRIST FRACTURE Left 08/04/2013   Procedure: OPEN REDUCTION INTERNAL FIXATION (ORIF) LEFT DISTAL RADIUS WRIST FRACTURE;  Surgeon: Wynonia Sours, MD;  Location: [place];  Service: Orthopedics;  Laterality: Left;   PARTIAL HYSTERECTOMY     TOTAL KNEE ARTHROPLASTY Left 08/26/2018   Procedure: TOTAL KNEE  ARTHROPLASTY;  Surgeon: Netta Cedars, MD;  Location: [facility];  Service: Orthopedics;  Laterality: Left;   VIDEO ASSISTED THORACOSCOPY (VATS)/WEDGE RESECTION Right 02/12/2014   Procedure: RIGHT VIDEO ASSISTED THORACOSCOPY RIGHT LOWER LOBE LUNG /WEDGE RESECTION,RIGHT THORACOTOMY WITH RIGHT LOWER LOBE LOBECTOMY & NODE DISSECTION;  Surgeon: Melrose Nakayama, MD;  Location: [place];  Service: Thoracic;  Laterality: Right;   VIDEO ASSISTED THORACOSCOPY (VATS)/WEDGE RESECTION Right 02/12/2014   VIDEO BRONCHOSCOPY N/A 02/12/2014   Procedure: VIDEO BRONCHOSCOPY;  Surgeon: Melrose Nakayama, MD;  Location: [place];  Service: Thoracic;  Laterality: N/A;   Patient Active Problem List   Diagnosis Date Noted   Acute ischemic left MCA stroke ([place]) 01/28/2022   Aphasia as late effect of cerebrovascular accident (CVA) 01/28/2022   Right hemiparesis ([place]) 01/28/2022    Abnormal gait due to muscle weakness 01/28/2022   Insomnia 10/13/2021   Frequency of urination 10/13/2021   Vitamin D deficiency 10/13/2021   Chronic renal failure 10/13/2021   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[place]) 09/24/2021   Aphasia due to acute cerebrovascular accident (CVA) ([place]) 09/21/2021   Chronic kidney disease, stage 3b ([place]) 09/21/2021   Depression with anxiety 09/21/2021   Hypertension 09/21/2021   Asthma, chronic 09/21/2021   Fall at home, initial encounter 09/21/2021   Right ankle pain 09/21/2021   Dysuria 08/03/2019   Chronic cough 12/20/2018   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   Status post total knee replacement, left 08/26/2018   Essential hypertension 02/23/2017   Adenocarcinoma of lung, stage 1 ([place]) 05/08/2014   S/P lobectomy of lung 02/12/2014   Asthma 01/30/2014    Multiple pulmonary nodules, largest R mid lung 06/29/2013   Cough variant asthma with component of uacs  05/31/2013    ONSET DATE: 01/28/2022   REFERRING DIAG: G81.91 (ICD-10-CM) - Right hemiparesis (HCC) I63.512 (ICD-10-CM) - Acute ischemic left MCA stroke (HCC)   THERAPY DIAG:  Muscle weakness (generalized)  Other abnormalities of gait and mobility  Hemiplegia and hemiparesis following cerebral infarction affecting right dominant side (HCC)  Rationale for Evaluation and Treatment Rehabilitation  SUBJECTIVE:                                                                                                                                                                                             SUBJECTIVE STATEMENT: Pt reports her R ankle is about the same (not better, not worse). Describes R ankle sensation more as a "tingling" and not pain so much. Pt worried about being able to drive with her RLE.  Pt accompanied by: self   PERTINENT HISTORY: hx of hypertension, thrombotic  stroke involving left middle cerebral artery, asthma, lung cancer, coronary  calcification seen on CT and CKD   PAIN:  Are you having pain? Yes: NPRS scale: 3/10 Pain location: R ankle Pain description: achy Aggravating factors: walking Relieving factors: nothing  PRECAUTIONS: Fall  PATIENT GOALS "to be able to walk without help and without pain"  OBJECTIVE:   TODAY'S TREATMENT:  THER ACT: Rocker board in // bars  -fwd/back with no UE support 2 x 30 sec, CGA  -fwd back with EC intermittent finger support 2 x 30 sec, CGA   -R/L with no UE support 2 x 30 sec, min A (LOB to the R 2x)   THER EX: Leg press 2 x 10 reps of 50# with manual assist to prevent R hip ER, cues to avoid locking out knees  Squats with rollator with airex behind pt on mat table as cue for correct body mechanics, 2 x 10 reps  Added to HEP, see bolded below   PATIENT EDUCATION: Education details: continue HEP, added to HEP Person educated: Patient Education method: Explanation, Demonstration, and Handouts Education comprehension: verbalized understanding   HOME EXERCISE PROGRAM: Access Code: [email or web address] URL: [email or web address] Date: 02/10/2022 Prepared by: Excell Seltzer  Exercises - Side Stepping with Counter Support  - 1 x daily - 7 x weekly - 3 sets - 10 reps - Forward Step Up with Counter Support  - 1 x daily - 7 x weekly - 3 sets - 10 reps - Towel Scrunches  - 1 x daily - 7 x weekly - 2 sets - 10 reps - Seated Ankle Alphabet  - 1 x daily - 7 x weekly - 1 sets - 10 reps - Mini Squat with Counter Support  - 1 x daily - 7 x weekly - 3 sets - 10 reps   GOALS: Goals reviewed with patient? Yes  SHORT TERM GOALS: Target date: 03/03/2022  Initiate HEP Baseline: initiated 8/8 Goal status: MET  2.  Pt will improve gait velocity to at least 2.5 ft/sec for improved gait efficiency and performance at Supervision level with LRAD Baseline: 2.26 ft/sec S* with RW (8/1) Goal status: REVISED  3.  Pt will improve normal TUG to less than or equal to 13 seconds  for improved functional mobility and decreased fall risk. Baseline: 14 sec with RW (8/1) Goal status: INITIAL  4.  Pt will improve Berg score to 40/56  for decreased fall risk Baseline: 34/56 (8/8) Goal status: INITIAL  5.  Pt will improve 5 x STS to less than or equal to 12 seconds to demonstrate improved functional strength and transfer efficiency.  Baseline: 13.8 (8/1) Goal status: INITIAL   LONG TERM GOALS: Target date: 03/31/2022  Pt will be independent with balance and strengthening HEP for improved strength, balance, transfers and gait. Baseline:  Goal status: INITIAL  2.  Pt will improve gait velocity to at least 3.0 ft/sec for improved gait efficiency and performance at mod I level with LRAD Baseline: 2.26 ft/sec S* with RW (8/1) Goal status: REVISED  3.  Pt will improve normal TUG to less than or equal to 12 seconds for improved functional mobility and decreased fall risk. Baseline: 14 sec with RW (8/1) Goal status: INITIAL  4.  Pt will improve Berg score to 46/56 for decreased fall risk Baseline: 34/56 (8/8) Goal status: INITIAL  5.  Pt will improve 5 x STS to less than or equal to 10 seconds to demonstrate improved functional strength and transfer efficiency.  Baseline: 13.8 sec (8/1) Goal status: INITIAL   ASSESSMENT:  CLINICAL IMPRESSION: Emphasis of skilled PT session on continuing to work on balance with use of ankle strategy and LE strengthening. Pt has difficulty describing the pain in her R ankle due to aphasia but appears to describe sensation more as "tingling" than actual pain. Reassessed ROM and strength which are [facility]. Pt continues to wear ASO for ankle support with gait. Pt continues to benefit from skilled therapy services to address balance and strength deficits limiting safety and independence with functional mobility.Continue POC.   OBJECTIVE IMPAIRMENTS Abnormal gait, decreased balance, decreased coordination, decreased mobility, decreased  strength, decreased safety awareness, and pain.   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   REHAB POTENTIAL: Good  CLINICAL DECISION MAKING: Stable/uncomplicated  EVALUATION COMPLEXITY: Moderate  PLAN: PT FREQUENCY: 2x/week  PT DURATION: 8 weeks  PLANNED INTERVENTIONS: Therapeutic exercises, Therapeutic activity, Neuromuscular re-education, Balance training, Gait training, Patient/Family education, Self Care, Joint mobilization, Stair training, DME instructions, Electrical stimulation, Cryotherapy, Moist heat, Taping, Manual therapy, and Re-evaluation  PLAN FOR NEXT SESSION:  Trial gait with no AD, add to HEP for balance and strengthening, obstacle course, LE coordination        Excell Seltzer, PT, DPT, CSRS 02/18/2022, 2:00 PM

## 2022-02-20 ENCOUNTER — Ambulatory Visit: Payer: Medicare Other

## 2022-02-20 DIAGNOSIS — M6281 Muscle weakness (generalized): Secondary | ICD-10-CM

## 2022-02-20 DIAGNOSIS — R2689 Other abnormalities of gait and mobility: Secondary | ICD-10-CM | POA: Diagnosis not present

## 2022-02-20 DIAGNOSIS — R4701 Aphasia: Secondary | ICD-10-CM | POA: Diagnosis not present

## 2022-02-20 DIAGNOSIS — I63512 Cerebral infarction due to unspecified occlusion or stenosis of left middle cerebral artery: Secondary | ICD-10-CM | POA: Diagnosis not present

## 2022-02-20 DIAGNOSIS — G8191 Hemiplegia, unspecified affecting right dominant side: Secondary | ICD-10-CM | POA: Diagnosis not present

## 2022-02-20 DIAGNOSIS — R482 Apraxia: Secondary | ICD-10-CM | POA: Diagnosis not present

## 2022-02-20 DIAGNOSIS — I69190 Apraxia following nontraumatic intracerebral hemorrhage: Secondary | ICD-10-CM | POA: Diagnosis not present

## 2022-02-20 DIAGNOSIS — I69351 Hemiplegia and hemiparesis following cerebral infarction affecting right dominant side: Secondary | ICD-10-CM | POA: Diagnosis not present

## 2022-02-20 NOTE — Therapy (Signed)
OUTPATIENT PHYSICAL THERAPY NEURO THERAPY   Patient Name: [name] MRN: [id number] DOB:September 03, 1940, 81 y.o., female Today's Date: 02/20/2022   PCP: Pt unable to provide information and unable to locate in chart. REFERRING PROVIDER: Courtney Heys, MD    PT End of Session - 02/20/22 1024     Visit Number 5    Number of Visits 17    Date for PT Re-Evaluation 03/31/22    Authorization Type UHC Medicare    Progress Note Due on Visit 10    PT Start Time 1022   patient late   PT Stop Time 1100    PT Time Calculation (min) 38 min    Equipment Utilized During Treatment Gait belt    Activity Tolerance Patient tolerated treatment well    Behavior During Therapy WFL for tasks assessed/performed            Past Medical History:  Diagnosis Date    Multiple pulmonary nodules, largest R mid lung 06/29/2013   Followed in Pulmonary clinic/ [facility]/ [place] - see CXR 06/27/13  - CT chest 07/14/2013 > 1. No acute findings in the thorax to account for the patient's  symptoms.  2. However, there is a subsolid nodule in the   right lower lobe that has a ground-glass attenuation component  measuring 2.5 x 1.8 cm, and a small central solid component  measuring 4 mm. Initial follow-up by chest CT without    Adenocarcinoma of lung, stage 1 ([place])    Status post right lower lobectomy August 2015   Anxiety    Asthma    Atrophic vaginitis    Chronic cough 12/20/2018   Chronic iritis of left eye    Pupil stays dilated; nonreactive   Chronic kidney disease, stage 3b (HCC)    Colon polyp    Contact lens/glasses fitting    wears contacts or glasses   Cough variant asthma with component of uacs  05/31/2013   Followed in Pulmonary clinic/ [facility]/ [facility] in her 41's - Spirometry 02/23/13 wnl  - 07/03/2013 Sinus CT > Mild chronic sinus disease - No acute findings. Left-to-right nasal septal deviation of 3 mm. -med calendar 08/08/13 , redone 06/01/2016 and 02/22/2017  - Eos 4%    10/13/2013  > rec singulair daily  - FENO 05/12/2016  =   24 on singulair  - Allergy profile 05/12/2016 >   IgE  47 pos   Depression    Depression with anxiety    Dysuria 08/03/2019   Environmental allergies    Essential hypertension 02/23/2017   Changed losartan to avapro 02/22/2017 due to cough    GERD (gastroesophageal reflux disease)    Hypertension    Hypertriglyceridemia    Kidney stones    OA (osteoarthritis)    Osteopenia    PONV (postoperative nausea and vomiting)    S/P lobectomy of lung 02/12/2014   Status post total knee replacement, left 08/26/2018   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   Past Surgical History:  Procedure Laterality Date   APPENDECTOMY     COLONOSCOPY     EYE SURGERY Left    cataract removal   ORIF WRIST FRACTURE Left 08/04/2013   Procedure: OPEN REDUCTION INTERNAL FIXATION (ORIF) LEFT DISTAL RADIUS WRIST FRACTURE;  Surgeon: Wynonia Sours, MD;  Location: [place];  Service: Orthopedics;  Laterality: Left;   PARTIAL HYSTERECTOMY     TOTAL KNEE ARTHROPLASTY Left 08/26/2018   Procedure: TOTAL KNEE ARTHROPLASTY;  Surgeon: Veverly Fells,  Richardson Landry, MD;  [facility];  Service: Orthopedics;  Laterality: Left;   VIDEO ASSISTED THORACOSCOPY (VATS)/WEDGE RESECTION Right 02/12/2014   Procedure: RIGHT VIDEO ASSISTED THORACOSCOPY RIGHT LOWER LOBE LUNG /WEDGE RESECTION,RIGHT THORACOTOMY WITH RIGHT LOWER LOBE LOBECTOMY & NODE DISSECTION;  Surgeon: Melrose Nakayama, MD;  Location: [place];  Service: Thoracic;  Laterality: Right;   VIDEO ASSISTED THORACOSCOPY (VATS)/WEDGE RESECTION Right 02/12/2014   VIDEO BRONCHOSCOPY N/A 02/12/2014   Procedure: VIDEO BRONCHOSCOPY;  Surgeon: Melrose Nakayama, MD;  Location: [place];  Service: Thoracic;  Laterality: N/A;   Patient Active Problem List   Diagnosis Date Noted   Acute ischemic left MCA stroke ([place]) 01/28/2022   Aphasia as late effect of cerebrovascular accident (CVA) 01/28/2022   Right hemiparesis ([place]) 01/28/2022    Abnormal gait due to muscle weakness 01/28/2022   Insomnia 10/13/2021   Frequency of urination 10/13/2021   Vitamin D deficiency 10/13/2021   Chronic renal failure 10/13/2021   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[place]) 09/24/2021   Aphasia due to acute cerebrovascular accident (CVA) ([place]) 09/21/2021   Chronic kidney disease, stage 3b ([place]) 09/21/2021   Depression with anxiety 09/21/2021   Hypertension 09/21/2021   Asthma, chronic 09/21/2021   Fall at home, initial encounter 09/21/2021   Right ankle pain 09/21/2021   Dysuria 08/03/2019   Chronic cough 12/20/2018   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   Status post total knee replacement, left 08/26/2018   Essential hypertension 02/23/2017   Adenocarcinoma of lung, stage 1 ([place]) 05/08/2014   S/P lobectomy of lung 02/12/2014   Asthma 01/30/2014    Multiple pulmonary nodules, largest R mid lung 06/29/2013   Cough variant asthma with component of uacs  05/31/2013    ONSET DATE: 01/28/2022   REFERRING DIAG: G81.91 (ICD-10-CM) - Right hemiparesis (HCC) I63.512 (ICD-10-CM) - Acute ischemic left MCA stroke (HCC)   THERAPY DIAG:  Muscle weakness (generalized)  Other abnormalities of gait and mobility  Rationale for Evaluation and Treatment Rehabilitation  SUBJECTIVE:                                                                                                                                                                                             SUBJECTIVE STATEMENT: Patient reports doing well- feels a little stiff. Elevated R ankle yesterday and it seemed to help.   Pt accompanied by: self   PERTINENT HISTORY: hx of hypertension, thrombotic stroke involving left middle cerebral artery, asthma, lung cancer, coronary calcification seen on CT and CKD   PAIN:  Are you having pain? No  PRECAUTIONS: Fall  PATIENT GOALS "to be able  to walk without help and without pain"  OBJECTIVE:   TODAY'S  TREATMENT:  GAIT: -115' x1 no AD-> dual tasking (LOB requiring up to MaxA to regain balance)  -4 square stepping with self-preference NBOS leading ot increased LOB and limited ability to independently regain  -ambulatory toe tap to cone + MinA  -weaving in and out of cones around corner with [facility] + CGA  PATIENT EDUCATION: Education details: continue HEP Person educated: Patient Education method: Explanation, Demonstration, and Handouts Education comprehension: verbalized understanding   HOME EXERCISE PROGRAM: Access Code: [email or web address] URL: [email or web address] Date: 02/10/2022 Prepared by: Excell Seltzer  Exercises - Side Stepping with Counter Support  - 1 x daily - 7 x weekly - 3 sets - 10 reps - Forward Step Up with Counter Support  - 1 x daily - 7 x weekly - 3 sets - 10 reps - Towel Scrunches  - 1 x daily - 7 x weekly - 2 sets - 10 reps - Seated Ankle Alphabet  - 1 x daily - 7 x weekly - 1 sets - 10 reps - Mini Squat with Counter Support  - 1 x daily - 7 x weekly - 3 sets - 10 reps   GOALS: Goals reviewed with patient? Yes  SHORT TERM GOALS: Target date: 03/03/2022  Initiate HEP Baseline: initiated 8/8 Goal status: MET  2.  Pt will improve gait velocity to at least 2.5 ft/sec for improved gait efficiency and performance at Supervision level with LRAD Baseline: 2.26 ft/sec S* with RW (8/1) Goal status: REVISED  3.  Pt will improve normal TUG to less than or equal to 13 seconds for improved functional mobility and decreased fall risk. Baseline: 14 sec with RW (8/1) Goal status: INITIAL  4.  Pt will improve Berg score to 40/56  for decreased fall risk Baseline: 34/56 (8/8) Goal status: INITIAL  5.  Pt will improve 5 x STS to less than or equal to 12 seconds to demonstrate improved functional strength and transfer efficiency.  Baseline: 13.8 (8/1) Goal status: INITIAL   LONG TERM GOALS: Target date: 03/31/2022  Pt will be independent with balance and  strengthening HEP for improved strength, balance, transfers and gait. Baseline:  Goal status: INITIAL  2.  Pt will improve gait velocity to at least 3.0 ft/sec for improved gait efficiency and performance at mod I level with LRAD Baseline: 2.26 ft/sec S* with RW (8/1) Goal status: REVISED  3.  Pt will improve normal TUG to less than or equal to 12 seconds for improved functional mobility and decreased fall risk. Baseline: 14 sec with RW (8/1) Goal status: INITIAL  4.  Pt will improve Berg score to 46/56 for decreased fall risk Baseline: 34/56 (8/8) Goal status: INITIAL  5.  Pt will improve 5 x STS to less than or equal to 10 seconds to demonstrate improved functional strength and transfer efficiency.  Baseline: 13.8 sec (8/1) Goal status: INITIAL   ASSESSMENT:  CLINICAL IMPRESSION: Patient seen for skilled PT session with emphasis on gait training with SPC vs no AD. She demonstrates limited balance strategies and increased LOB with mild dual tasking. Patient tendency to attempt to regain balance by crossing one LE over the other increasing her risk for falling. Continue POC.    OBJECTIVE IMPAIRMENTS Abnormal gait, decreased balance, decreased coordination, decreased mobility, decreased strength, decreased safety awareness, and pain.   ACTIVITY LIMITATIONS carrying, lifting, bending, squatting, stairs, transfers, bathing, dressing, and hygiene/grooming  PARTICIPATION LIMITATIONS: meal prep, cleaning, laundry, medication  management, driving, shopping, community activity, yard work, and school  PERSONAL FACTORS hx of hypertension, thrombotic stroke involving left middle cerebral artery, asthma, lung cancer, coronary calcification seen on CT and CKD  are also affecting patient's functional outcome.   REHAB POTENTIAL: Good  CLINICAL DECISION MAKING: Stable/uncomplicated  EVALUATION COMPLEXITY: Moderate  PLAN: PT FREQUENCY: 2x/week  PT DURATION: 8 weeks  PLANNED INTERVENTIONS:  Therapeutic exercises, Therapeutic activity, Neuromuscular re-education, Balance training, Gait training, Patient/Family education, Self Care, Joint mobilization, Stair training, DME instructions, Electrical stimulation, Cryotherapy, Moist heat, Taping, Manual therapy, and Re-evaluation  PLAN FOR NEXT SESSION:  Trial gait with SPC vs SBQC, add to HEP for balance and strengthening, obstacle course, LE coordination   Debbora Dus, PT, DPT Debbora Dus, PT, DPT, CBIS  02/20/2022, 11:01 AM

## 2022-02-23 ENCOUNTER — Ambulatory Visit: Payer: Medicare Other | Admitting: Physical Therapy

## 2022-02-23 ENCOUNTER — Encounter: Payer: Medicare Other | Admitting: Speech Pathology

## 2022-02-24 ENCOUNTER — Ambulatory Visit: Payer: Medicare Other | Admitting: Speech Pathology

## 2022-02-24 ENCOUNTER — Ambulatory Visit: Payer: Medicare Other | Admitting: Physical Therapy

## 2022-02-24 DIAGNOSIS — I69190 Apraxia following nontraumatic intracerebral hemorrhage: Secondary | ICD-10-CM | POA: Diagnosis not present

## 2022-02-24 DIAGNOSIS — R4701 Aphasia: Secondary | ICD-10-CM | POA: Diagnosis not present

## 2022-02-24 DIAGNOSIS — G8191 Hemiplegia, unspecified affecting right dominant side: Secondary | ICD-10-CM | POA: Diagnosis not present

## 2022-02-24 DIAGNOSIS — I69351 Hemiplegia and hemiparesis following cerebral infarction affecting right dominant side: Secondary | ICD-10-CM | POA: Diagnosis not present

## 2022-02-24 DIAGNOSIS — R41841 Cognitive communication deficit: Secondary | ICD-10-CM

## 2022-02-24 DIAGNOSIS — R2689 Other abnormalities of gait and mobility: Secondary | ICD-10-CM

## 2022-02-24 DIAGNOSIS — R482 Apraxia: Secondary | ICD-10-CM | POA: Diagnosis not present

## 2022-02-24 DIAGNOSIS — M6281 Muscle weakness (generalized): Secondary | ICD-10-CM

## 2022-02-24 DIAGNOSIS — I63512 Cerebral infarction due to unspecified occlusion or stenosis of left middle cerebral artery: Secondary | ICD-10-CM | POA: Diagnosis not present

## 2022-02-24 NOTE — Therapy (Signed)
OUTPATIENT SPEECH LANGUAGE PATHOLOGY TREATMENT   Patient Name: [name] MRN: [id number] DOB:12-29-1940, 81 y.o., female Today's Date: 02/24/2022  PCP: none on file REFERRING PROVIDER: Courtney Heys, MD   End of Session - 02/24/22 1022     Visit Number 4    Number of Visits 17    Date for SLP Re-Evaluation 03/31/22    Progress Note Due on Visit 10    SLP Start Time 1017    SLP Stop Time  1100    SLP Time Calculation (min) 43 min    Activity Tolerance Patient tolerated treatment well               Past Medical History:  Diagnosis Date    Multiple pulmonary nodules, largest R mid lung 06/29/2013   Followed in Pulmonary clinic/ [facility]/ [place] - see CXR 06/27/13  - CT chest 07/14/2013 > 1. No acute findings in the thorax to account for the patient's  symptoms.  2. However, there is a subsolid nodule in the   right lower lobe that has a ground-glass attenuation component  measuring 2.5 x 1.8 cm, and a small central solid component  measuring 4 mm. Initial follow-up by chest CT without    Adenocarcinoma of lung, stage 1 ([place])    Status post right lower lobectomy August 2015   Anxiety    Asthma    Atrophic vaginitis    Chronic cough 12/20/2018   Chronic iritis of left eye    Pupil stays dilated; nonreactive   Chronic kidney disease, stage 3b (HCC)    Colon polyp    Contact lens/glasses fitting    wears contacts or glasses   Cough variant asthma with component of uacs  05/31/2013   Followed in Pulmonary clinic/ [facility]/ [place] [facility] in her 89's - Spirometry 02/23/13 wnl  - 07/03/2013 Sinus CT > Mild chronic sinus disease - No acute findings. Left-to-right nasal septal deviation of 3 mm. -med calendar 08/08/13 , redone 06/01/2016 and 02/22/2017  - Eos 4%   10/13/2013  > rec singulair daily  - FENO 05/12/2016  =   24 on singulair  - Allergy profile 05/12/2016 >   IgE  47 pos   Depression    Depression with anxiety    Dysuria 08/03/2019   Environmental allergies     Essential hypertension 02/23/2017   Changed losartan to avapro 02/22/2017 due to cough    GERD (gastroesophageal reflux disease)    Hypertension    Hypertriglyceridemia    Kidney stones    OA (osteoarthritis)    Osteopenia    PONV (postoperative nausea and vomiting)    S/P lobectomy of lung 02/12/2014   Status post total knee replacement, left 08/26/2018   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   Past Surgical History:  Procedure Laterality Date   APPENDECTOMY     COLONOSCOPY     EYE SURGERY Left    cataract removal   ORIF WRIST FRACTURE Left 08/04/2013   Procedure: OPEN REDUCTION INTERNAL FIXATION (ORIF) LEFT DISTAL RADIUS WRIST FRACTURE;  Surgeon: Wynonia Sours, MD;  Location: [place];  Service: Orthopedics;  Laterality: Left;   PARTIAL HYSTERECTOMY     TOTAL KNEE ARTHROPLASTY Left 08/26/2018   Procedure: TOTAL KNEE ARTHROPLASTY;  Surgeon: Netta Cedars, MD;  Location: [facility];  Service: Orthopedics;  Laterality: Left;   VIDEO ASSISTED THORACOSCOPY (VATS)/WEDGE RESECTION Right 02/12/2014   Procedure: RIGHT VIDEO ASSISTED THORACOSCOPY RIGHT LOWER LOBE LUNG /WEDGE RESECTION,RIGHT THORACOTOMY WITH  RIGHT LOWER LOBE LOBECTOMY & NODE DISSECTION;  Surgeon: Melrose Nakayama, MD;  Location: [place];  Service: Thoracic;  Laterality: Right;   VIDEO ASSISTED THORACOSCOPY (VATS)/WEDGE RESECTION Right 02/12/2014   VIDEO BRONCHOSCOPY N/A 02/12/2014   Procedure: VIDEO BRONCHOSCOPY;  Surgeon: Melrose Nakayama, MD;  Location: [place];  Service: Thoracic;  Laterality: N/A;   Patient Active Problem List   Diagnosis Date Noted   Acute ischemic left MCA stroke ([place]) 01/28/2022   Aphasia as late effect of cerebrovascular accident (CVA) 01/28/2022   Right hemiparesis ([place]) 01/28/2022   Abnormal gait due to muscle weakness 01/28/2022   Insomnia 10/13/2021   Frequency of urination 10/13/2021   Vitamin D deficiency 10/13/2021   Chronic renal failure 10/13/2021   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[place]) 09/24/2021   Aphasia due to acute cerebrovascular accident (CVA) ([place]) 09/21/2021   Chronic kidney disease, stage 3b ([place]) 09/21/2021   Depression with anxiety 09/21/2021   Hypertension 09/21/2021   Asthma, chronic 09/21/2021   Fall at home, initial encounter 09/21/2021   Right ankle pain 09/21/2021   Dysuria 08/03/2019   Chronic cough 12/20/2018   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   Status post total knee replacement, left 08/26/2018   Essential hypertension 02/23/2017   Adenocarcinoma of lung, stage 1 ([place]) 05/08/2014   S/P lobectomy of lung 02/12/2014   Asthma 01/30/2014    Multiple pulmonary nodules, largest R mid lung 06/29/2013   Cough variant asthma with component of uacs  05/31/2013    ONSET DATE: March 2023, referral July 2023   REFERRING DIAG:  I69.320 (ICD-10-CM) - Aphasia as late effect of cerebrovascular accident (CVA)  I63.512 (ICD-10-CM) - Acute ischemic left MCA stroke (HCC)    THERAPY DIAG:  Apraxia  Aphasia  Cognitive communication deficit  Rationale for Evaluation and Treatment Rehabilitation  SUBJECTIVE:   SUBJECTIVE STATEMENT: Pt reports ongoing word finding, though overall is doing well  PAIN:  Are you having pain? Yes: NPRS scale: 3/10 Pain location: R ankle   OBJECTIVE:  TODAY'S TREATMENT:  02-24-22: Usual vague language and anomia demonstrated in opening conversation in response to SLP questions. Given usual extended time and min-A, able to elaborate responses to more comprehensively answer SLP ?s. Targeted naming and writing through structured language task in which pt generated related words to given topic. Topic 1, pt generates x2 related words IND, with extended time provided, additional x1 with usual mod-A of semantic cues. Pt requires visual model and usual min-A to use for writing list of x5 words. Generated grocery list in which pt generated x17 frequently purchased items with usual  min-A. Updated HEP.  02-13-22: SLP led pt through response elaboration task in which pt described pictures and answered wh- questions to aid in using robust language to name and describe. Pt requiring usual mod-A to fully describe but naming items in photos with only rare min-A. Using variety of word types including verbs, nouns, adjectives during today's task. Self-awareness and attempt to correct paraphasias x4 during today's sessions. SLP provides visual cues for use of targeted anomia strategies: giving time, describing, and using similar word. Requires usual verbal -A for implementation of strategies when anomia occurs.   02-10-22: SLP provided instruction on anomia strategies with focus on giving self extra time, trying to avoid frsutration, and using description. Faciliated describing through SFA in which pt was able to name and describe x2 presented photos with usual mod-A to provide salient details in description.Given usual extended time, pt  demonstrating robust vocabulary and intermittent impact of paraphasias. Given mod-A, pt able to name x5 things she would do when going to a horse show and x4 things one may do on a beach vacation.   02-03-22: Trial cues to assess most facilitative context for pt, appears phonemic cueing most helpful during today's session. Rare success with description cues to overcome anomia. SLP provides education on evaluation results, SLP recommendations, POC, and goals. Pt provided opportunity to ask all questions and all are answered to pt satisfaction at this time.    PATIENT EDUCATION: Education details: see above Person educated: Patient Education method: Customer service manager Education comprehension: verbalized understanding, returned demonstration, and needs further education   GOALS: Goals reviewed with patient? Yes  SHORT TERM GOALS: Target date: 03/03/22  Pt will verbalize 7 items in personally relevant categories with occasional min-A over 2  therapy sessions Baseline: 02-24-22 Goal status: IN PROGRESS  2.  Pt will correct semantic paraphasia given usual mod-A in 70% of opportunities during structured task over 2 sessions.  Baseline: 02-24-22 Goal status: IN PROGRESS  3.  Pt will write x5 item list with 80% accuracy, given usual mod-A over 2 sessions.  Baseline: 02-24-22 Goal status: IN PROGRESS  4.  Pt will successfully use trained anomia compensation in x3 opportunities across 2 therapy sessions with occasioanl min-A.  Baseline: 02-24-22 Goal status: IN PROGRESS   LONG TERM GOALS: Target date: 03/31/2022  Pt will identify, and correct, 50% of semantic paraphasias during structured task with occasional min-A over 2 sessions.  Baseline:  Goal status: IN PROGRESS  2.  Pt will report improvement of 2 points via CPIB PROM by d/c Baseline: 15 Goal status: IN PROGRESS  3.  Pt will carryover dysnomia compensations to conversational speech, resulting in strategy use in 70% of opportunities over 15 minute conversation Baseline:  Goal status: IN PROGRESS  4.  Pt will write x10 phrases, with mod-I, with 80% accuracy over 2 sessions.   Baseline:  Goal status: INITIAL  ASSESSMENT:  CLINICAL IMPRESSION: Patient is a 81 y.o. F who was seen today for aphasia evaluation 2/2 stroke. Pt presenting with mild-moderate expressive aphasia. Receptive language skills appear to be within gross normal limits. Spontaneous speech is characterized by usual vague language, anomia and frequent paraphasias impacting clarity and cohesiveness of message. Limited awareness of paraphasias exhibited during evaluation. Trial of cueing from SLP demonstrating pt is stimulable for phonemic and phrase completion to facilitate word finding. Repetition of paraphasia intermittently improved awareness of errors. Written expression additionally impaired, with improved awareness. Unable to write self-generate sentence and reports desire to work on this area. Reports to  having successful communication with family and close friends with increased difficulty when out in community. Below normal limits on confrontation naming task, unable to recall names of caregivers. Requires prolonged time to verbalize church's name. SLP probes cognition, however limited insight or needs ID d/t communication impairment and pt attending session alone. SLP will continue to monitor. I recommend skilled ST to address expressive aphasia to improve QoL and increase communication effectiveness across communication partners and while out in community.   OBJECTIVE IMPAIRMENTS include expressive language, aphasia, and apraxia. These impairments are limiting patient from effectively communicating at home and in community. Factors affecting potential to achieve goals and functional outcome are family/community support. Patient will benefit from skilled SLP services to address above impairments and improve overall function.  REHAB POTENTIAL: Good  PLAN: SLP FREQUENCY: 2x/week  SLP DURATION: 8 weeks  PLANNED  INTERVENTIONS: Language facilitation, Cueing hierachy, Internal/external aids, Functional tasks, Multimodal communication approach, SLP instruction and feedback, Compensatory strategies, and Patient/family education    Su Monks, CCC-SLP 02/24/2022, 10:23 AM

## 2022-02-24 NOTE — Patient Instructions (Addendum)
Write each word 2 times  Make sure you look at the model when writing and stay on the line  [name]  ________________    __________________  [name] ________________    __________________  [name] ________________    __________________  [name] ________________    __________________  [name] ________________    __________________  [name] ________________    __________________  [name] ________________    __________________  [name] ________________    __________________  [name] ________________    __________________  [name] ________________    __________________  [name] ________________    __________________  [name] ________________    __________________  [name] ________________    __________________  [name] ________________    __________________  [name] ________________    __________________  [name] ________________    __________________  [name] ________________    __________________  [name] ________________    __________________

## 2022-02-24 NOTE — Therapy (Signed)
OUTPATIENT PHYSICAL THERAPY NEURO THERAPY   Patient Name: [name] MRN: [id number] DOB:[birth year], 81 y.o., female Today's Date: 02/24/2022   PCP: Pt unable to provide information and unable to locate in chart. REFERRING PROVIDER: Courtney Heys, MD    PT End of Session - 02/24/22 1102     Visit Number 6    Number of Visits 17    Date for PT Re-Evaluation 03/31/22    Authorization Type [employer] Medicare    Progress Note Due on Visit 10    PT Start Time 1100    PT Stop Time 1143    PT Time Calculation (min) 43 min    Equipment Utilized During Treatment Gait belt    Activity Tolerance Patient tolerated treatment well    Behavior During Therapy WFL for tasks assessed/performed             Past Medical History:  Diagnosis Date    Multiple pulmonary nodules, largest R mid lung 06/29/2013   Followed in Pulmonary clinic/ [facility]/ [place] - see CXR 06/27/13  - CT chest 07/14/2013 > 1. No acute findings in the thorax to account for the patient's  symptoms.  2. However, there is a subsolid nodule in the   right lower lobe that has a ground-glass attenuation component  measuring 2.5 x 1.8 cm, and a small central solid component  measuring 4 mm. Initial follow-up by chest CT without    Adenocarcinoma of lung, stage 1 ([place])    Status post right lower lobectomy August 2015   Anxiety    Asthma    Atrophic vaginitis    Chronic cough 12/20/2018   Chronic iritis of left eye    Pupil stays dilated; nonreactive   Chronic kidney disease, stage 3b (HCC)    Colon polyp    Contact lens/glasses fitting    wears contacts or glasses   Cough variant asthma with component of uacs  05/31/2013   Followed in Pulmonary clinic/ [facility]/ [place] [facility] in her 50's - Spirometry 02/23/13 wnl  - 07/03/2013 Sinus CT > Mild chronic sinus disease - No acute findings. Left-to-right nasal septal deviation of 3 mm. -med calendar 08/08/13 , redone 06/01/2016 and 02/22/2017  - Eos 4%   10/13/2013  > rec  singulair daily  - FENO 05/12/2016  =   24 on singulair  - Allergy profile 05/12/2016 >   IgE  47 pos   Depression    Depression with anxiety    Dysuria 08/03/2019   Environmental allergies    Essential hypertension 02/23/2017   Changed losartan to avapro 02/22/2017 due to cough    GERD (gastroesophageal reflux disease)    Hypertension    Hypertriglyceridemia    Kidney stones    OA (osteoarthritis)    Osteopenia    PONV (postoperative nausea and vomiting)    S/P lobectomy of lung 02/12/2014   Status post total knee replacement, left 08/26/2018   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   Past Surgical History:  Procedure Laterality Date   APPENDECTOMY     COLONOSCOPY     EYE SURGERY Left    cataract removal   ORIF WRIST FRACTURE Left 08/04/2013   Procedure: OPEN REDUCTION INTERNAL FIXATION (ORIF) LEFT DISTAL RADIUS WRIST FRACTURE;  Surgeon: Wynonia Sours, MD;  Location: [place];  Service: Orthopedics;  Laterality: Left;   PARTIAL HYSTERECTOMY     TOTAL KNEE ARTHROPLASTY Left 08/26/2018   Procedure: TOTAL KNEE ARTHROPLASTY;  Surgeon: Netta Cedars, MD;  [facility];  Service: Orthopedics;  Laterality: Left;   VIDEO ASSISTED THORACOSCOPY (VATS)/WEDGE RESECTION Right 02/12/2014   Procedure: RIGHT VIDEO ASSISTED THORACOSCOPY RIGHT LOWER LOBE LUNG /WEDGE RESECTION,RIGHT THORACOTOMY WITH RIGHT LOWER LOBE LOBECTOMY & NODE DISSECTION;  Surgeon: Melrose Nakayama, MD;  Location: [place];  Service: Thoracic;  Laterality: Right;   VIDEO ASSISTED THORACOSCOPY (VATS)/WEDGE RESECTION Right 02/12/2014   VIDEO BRONCHOSCOPY N/A 02/12/2014   Procedure: VIDEO BRONCHOSCOPY;  Surgeon: Melrose Nakayama, MD;  Location: [place];  Service: Thoracic;  Laterality: N/A;   Patient Active Problem List   Diagnosis Date Noted   Acute ischemic left MCA stroke ([place]) 01/28/2022   Aphasia as late effect of cerebrovascular accident (CVA) 01/28/2022   Right hemiparesis ([place]) 01/28/2022   Abnormal gait  due to muscle weakness 01/28/2022   Insomnia 10/13/2021   Frequency of urination 10/13/2021   Vitamin D deficiency 10/13/2021   Chronic renal failure 10/13/2021   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[place]) 09/24/2021   Aphasia due to acute cerebrovascular accident (CVA) ([place]) 09/21/2021   Chronic kidney disease, stage 3b ([place]) 09/21/2021   Depression with anxiety 09/21/2021   Hypertension 09/21/2021   Asthma, chronic 09/21/2021   Fall at home, initial encounter 09/21/2021   Right ankle pain 09/21/2021   Dysuria 08/03/2019   Chronic cough 12/20/2018   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   Status post total knee replacement, left 08/26/2018   Essential hypertension 02/23/2017   Adenocarcinoma of lung, stage 1 ([place]) 05/08/2014   S/P lobectomy of lung 02/12/2014   Asthma 01/30/2014    Multiple pulmonary nodules, largest R mid lung 06/29/2013   Cough variant asthma with component of uacs  05/31/2013    ONSET DATE: 01/28/2022   REFERRING DIAG: G81.91 (ICD-10-CM) - Right hemiparesis (HCC) I63.512 (ICD-10-CM) - Acute ischemic left MCA stroke (HCC)   THERAPY DIAG:  Muscle weakness (generalized)  Other abnormalities of gait and mobility  Hemiplegia and hemiparesis following cerebral infarction affecting right dominant side (HCC)  Rationale for Evaluation and Treatment Rehabilitation  SUBJECTIVE:                                                                                                                                                                                             SUBJECTIVE STATEMENT: Pt reports her R ankle is "stinging" today, reports it has been feeling that way since her stroke but is more noticeable today. Unable to verbalize what makes sensation better/worse. Pt reports she still uses rollator in the home but not in the kitchen when she has countertops she can grab if needed for balance.  Pt accompanied by: self   PERTINENT HISTORY: hx  of hypertension, thrombotic stroke involving left middle cerebral artery, asthma, lung cancer, coronary calcification seen on CT and CKD   PAIN:  Are you having pain? No  PRECAUTIONS: Fall  PATIENT GOALS "to be able to walk without help and without pain"  OBJECTIVE:   TODAY'S TREATMENT: NMR: Resisted sit to stand from EOM with red theraband, 2 x 10 reps, no UE support  GAIT: Gait pattern:  mild balance impairment with path deviation to the L, dec B foot clearance, and step to pattern Distance walked: 230 ft Assistive device utilized: None Level of assistance: CGA Comments: trial gait with no AD, mild balance impairments noted  Gait pattern: step through pattern Distance walked: 200 ft Assistive device utilized: Single point cane "hurricane" Level of assistance: SBA and CGA Comments: improved balance and LE clearance with use of SPC vs no AD  Gait with bungee cord x 230 ft with CGA to min A and no AD with resistance and multi-directional perturbations. Pt reports this task was difficult but felt that 2nd lap was better/easier than the first lap.  Gait with bungee cord x 230 ft with CGA to min A with focus on multi-directional perturbations. Pt reports this is more difficult than first round with resistance. Pt demonstrates good ability to utilize lateral and forward stepping strategy to prevent LOB.   PATIENT EDUCATION: Education details: continue HEP Person educated: Patient Education method: Explanation, Demonstration, and Handouts Education comprehension: verbalized understanding   HOME EXERCISE PROGRAM: Access Code: [email or web address] URL: [email or web address] Date: 02/10/2022 Prepared by: Excell Seltzer  Exercises - Side Stepping with Counter Support  - 1 x daily - 7 x weekly - 3 sets - 10 reps - Forward Step Up with Counter Support  - 1 x daily - 7 x weekly - 3 sets - 10 reps - Towel Scrunches  - 1 x daily - 7 x weekly - 2 sets - 10 reps - Seated Ankle  Alphabet  - 1 x daily - 7 x weekly - 1 sets - 10 reps - Mini Squat with Counter Support  - 1 x daily - 7 x weekly - 3 sets - 10 reps   GOALS: Goals reviewed with patient? Yes  SHORT TERM GOALS: Target date: 03/03/2022  Initiate HEP Baseline: initiated 8/8 Goal status: MET  2.  Pt will improve gait velocity to at least 2.5 ft/sec for improved gait efficiency and performance at Supervision level with LRAD Baseline: 2.26 ft/sec S* with RW (8/1) Goal status: REVISED  3.  Pt will improve normal TUG to less than or equal to 13 seconds for improved functional mobility and decreased fall risk. Baseline: 14 sec with RW (8/1) Goal status: INITIAL  4.  Pt will improve Berg score to 40/56  for decreased fall risk Baseline: 34/56 (8/8) Goal status: INITIAL  5.  Pt will improve 5 x STS to less than or equal to 12 seconds to demonstrate improved functional strength and transfer efficiency.  Baseline: 13.8 (8/1) Goal status: INITIAL   LONG TERM GOALS: Target date: 03/31/2022  Pt will be independent with balance and strengthening HEP for improved strength, balance, transfers and gait. Baseline:  Goal status: INITIAL  2.  Pt will improve gait velocity to at least 3.0 ft/sec for improved gait efficiency and performance at mod I level with LRAD Baseline: 2.26 ft/sec S* with RW (8/1) Goal status: REVISED  3.  Pt will improve normal TUG to less than or  equal to 12 seconds for improved functional mobility and decreased fall risk. Baseline: 14 sec with RW (8/1) Goal status: INITIAL  4.  Pt will improve Berg score to 46/56 for decreased fall risk Baseline: 34/56 (8/8) Goal status: INITIAL  5.  Pt will improve 5 x STS to less than or equal to 10 seconds to demonstrate improved functional strength and transfer efficiency.  Baseline: 13.8 sec (8/1) Goal status: INITIAL   ASSESSMENT:  CLINICAL IMPRESSION: Emphasis of skilled PT session on working on gait training, stepping strategies for  balance, and LE NMR for improved balance and coordination. Pt exhibits improved balance with decreased AD reliance noted. Pt continues to progress towards being more independent with gait and less reliant on AD. Pt also exhibits good use of stepping strategy for balance recovery. Continue POC.   OBJECTIVE IMPAIRMENTS Abnormal gait, decreased balance, decreased coordination, decreased mobility, decreased strength, decreased safety awareness, and pain.   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   REHAB POTENTIAL: Good  CLINICAL DECISION MAKING: Stable/uncomplicated  EVALUATION COMPLEXITY: Moderate  PLAN: PT FREQUENCY: 2x/week  PT DURATION: 8 weeks  PLANNED INTERVENTIONS: Therapeutic exercises, Therapeutic activity, Neuromuscular re-education, Balance training, Gait training, Patient/Family education, Self Care, Joint mobilization, Stair training, DME instructions, Electrical stimulation, Cryotherapy, Moist heat, Taping, Manual therapy, and Re-evaluation  PLAN FOR NEXT SESSION:  Trial gait with SPC vs no AD, add to HEP for balance and strengthening, obstacle course, LE coordination   Excell Seltzer, PT, DPT, CSRS 02/24/2022, 11:45 AM

## 2022-02-26 ENCOUNTER — Ambulatory Visit: Payer: Medicare Other | Admitting: Physical Therapy

## 2022-02-26 ENCOUNTER — Encounter: Payer: Medicare Other | Admitting: Occupational Therapy

## 2022-02-26 ENCOUNTER — Ambulatory Visit: Payer: Medicare Other | Admitting: Speech Pathology

## 2022-02-26 DIAGNOSIS — M6281 Muscle weakness (generalized): Secondary | ICD-10-CM

## 2022-02-26 DIAGNOSIS — G8191 Hemiplegia, unspecified affecting right dominant side: Secondary | ICD-10-CM | POA: Diagnosis not present

## 2022-02-26 DIAGNOSIS — R2689 Other abnormalities of gait and mobility: Secondary | ICD-10-CM | POA: Diagnosis not present

## 2022-02-26 DIAGNOSIS — I69351 Hemiplegia and hemiparesis following cerebral infarction affecting right dominant side: Secondary | ICD-10-CM

## 2022-02-26 DIAGNOSIS — I69190 Apraxia following nontraumatic intracerebral hemorrhage: Secondary | ICD-10-CM | POA: Diagnosis not present

## 2022-02-26 DIAGNOSIS — I63512 Cerebral infarction due to unspecified occlusion or stenosis of left middle cerebral artery: Secondary | ICD-10-CM | POA: Diagnosis not present

## 2022-02-26 DIAGNOSIS — R482 Apraxia: Secondary | ICD-10-CM | POA: Diagnosis not present

## 2022-02-26 DIAGNOSIS — R4701 Aphasia: Secondary | ICD-10-CM

## 2022-02-26 DIAGNOSIS — R41841 Cognitive communication deficit: Secondary | ICD-10-CM

## 2022-02-26 NOTE — Patient Instructions (Addendum)
My Family:   [facility]              My address:   [address]                    ____________________  [place] [id number]       ____________________  My name:   [name]               ____________________  My birthday:  1941/02/05                             ____________________

## 2022-02-26 NOTE — Therapy (Signed)
OUTPATIENT PHYSICAL THERAPY NEURO THERAPY   Patient Name: [name] MRN: [id number] DOB:[birth year], 81 y.o., female Today's Date: 02/26/2022   PCP: Pt unable to provide information and unable to locate in chart. REFERRING PROVIDER: Courtney Heys, MD    PT End of Session - 02/26/22 1102     Visit Number 7    Number of Visits 17    Date for PT Re-Evaluation 03/31/22    Authorization Type [employer] Medicare    Progress Note Due on Visit 10    PT Start Time 1100    PT Stop Time 1143    PT Time Calculation (min) 43 min    Equipment Utilized During Treatment Gait belt    Activity Tolerance Patient tolerated treatment well    Behavior During Therapy WFL for tasks assessed/performed              Past Medical History:  Diagnosis Date    Multiple pulmonary nodules, largest R mid lung 06/29/2013   Followed in Pulmonary clinic/ [facility]/ [facility] - see CXR 06/27/13  - CT chest 07/14/2013 > 1. No acute findings in the thorax to account for the patient's  symptoms.  2. However, there is a subsolid nodule in the   right lower lobe that has a ground-glass attenuation component  measuring 2.5 x 1.8 cm, and a small central solid component  measuring 4 mm. Initial follow-up by chest CT without    Adenocarcinoma of lung, stage 1 ([place])    Status post right lower lobectomy August 2015   Anxiety    Asthma    Atrophic vaginitis    Chronic cough 12/20/2018   Chronic iritis of left eye    Pupil stays dilated; nonreactive   Chronic kidney disease, stage 3b (HCC)    Colon polyp    Contact lens/glasses fitting    wears contacts or glasses   Cough variant asthma with component of uacs  05/31/2013   Followed in Pulmonary clinic/ [facility]/ [facility] in her 71's - Spirometry 02/23/13 wnl  - 07/03/2013 Sinus CT > Mild chronic sinus disease - No acute findings. Left-to-right nasal septal deviation of 3 mm. -med calendar 08/08/13 , redone 06/01/2016 and 02/22/2017  - Eos 4%   10/13/2013  >  rec singulair daily  - FENO 05/12/2016  =   24 on singulair  - Allergy profile 05/12/2016 >   IgE  47 pos   Depression    Depression with anxiety    Dysuria 08/03/2019   Environmental allergies    Essential hypertension 02/23/2017   Changed losartan to avapro 02/22/2017 due to cough    GERD (gastroesophageal reflux disease)    Hypertension    Hypertriglyceridemia    Kidney stones    OA (osteoarthritis)    Osteopenia    PONV (postoperative nausea and vomiting)    S/P lobectomy of lung 02/12/2014   Status post total knee replacement, left 08/26/2018   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   Past Surgical History:  Procedure Laterality Date   APPENDECTOMY     COLONOSCOPY     EYE SURGERY Left    cataract removal   ORIF WRIST FRACTURE Left 08/04/2013   Procedure: OPEN REDUCTION INTERNAL FIXATION (ORIF) LEFT DISTAL RADIUS WRIST FRACTURE;  Surgeon: Wynonia Sours, MD;  Location: [place];  Service: Orthopedics;  Laterality: Left;   PARTIAL HYSTERECTOMY     TOTAL KNEE ARTHROPLASTY Left 08/26/2018   Procedure: TOTAL KNEE ARTHROPLASTY;  Surgeon: Netta Cedars,  MD;  Location: [facility];  Service: Orthopedics;  Laterality: Left;   VIDEO ASSISTED THORACOSCOPY (VATS)/WEDGE RESECTION Right 02/12/2014   Procedure: RIGHT VIDEO ASSISTED THORACOSCOPY RIGHT LOWER LOBE LUNG /WEDGE RESECTION,RIGHT THORACOTOMY WITH RIGHT LOWER LOBE LOBECTOMY & NODE DISSECTION;  Surgeon: Melrose Nakayama, MD;  Location: [place];  Service: Thoracic;  Laterality: Right;   VIDEO ASSISTED THORACOSCOPY (VATS)/WEDGE RESECTION Right 02/12/2014   VIDEO BRONCHOSCOPY N/A 02/12/2014   Procedure: VIDEO BRONCHOSCOPY;  Surgeon: Melrose Nakayama, MD;  Location: [place];  Service: Thoracic;  Laterality: N/A;   Patient Active Problem List   Diagnosis Date Noted   Acute ischemic left MCA stroke ([place]) 01/28/2022   Aphasia as late effect of cerebrovascular accident (CVA) 01/28/2022   Right hemiparesis ([place]) 01/28/2022   Abnormal  gait due to muscle weakness 01/28/2022   Insomnia 10/13/2021   Frequency of urination 10/13/2021   Vitamin D deficiency 10/13/2021   Chronic renal failure 10/13/2021   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[place]) 09/24/2021   Aphasia due to acute cerebrovascular accident (CVA) ([place]) 09/21/2021   Chronic kidney disease, stage 3b ([place]) 09/21/2021   Depression with anxiety 09/21/2021   Hypertension 09/21/2021   Asthma, chronic 09/21/2021   Fall at home, initial encounter 09/21/2021   Right ankle pain 09/21/2021   Dysuria 08/03/2019   Chronic cough 12/20/2018   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   Status post total knee replacement, left 08/26/2018   Essential hypertension 02/23/2017   Adenocarcinoma of lung, stage 1 ([place]) 05/08/2014   S/P lobectomy of lung 02/12/2014   Asthma 01/30/2014    Multiple pulmonary nodules, largest R mid lung 06/29/2013   Cough variant asthma with component of uacs  05/31/2013    ONSET DATE: 01/28/2022   REFERRING DIAG: G81.91 (ICD-10-CM) - Right hemiparesis (HCC) I63.512 (ICD-10-CM) - Acute ischemic left MCA stroke (HCC)   THERAPY DIAG:  Muscle weakness (generalized)  Other abnormalities of gait and mobility  Hemiplegia and hemiparesis following cerebral infarction affecting right dominant side (HCC)  Rationale for Evaluation and Treatment Rehabilitation  SUBJECTIVE:                                                                                                                                                                                             SUBJECTIVE STATEMENT: Pt reports she feels like the therapy has been helpful especially for her R ankle. Pt reports therapy wears her out and she has to go home and take a nap afterwards.   Pt accompanied by: self   PERTINENT HISTORY: hx of hypertension, thrombotic stroke involving left middle cerebral artery, asthma, lung  cancer, coronary calcification seen on CT and CKD    PAIN:  Are you having pain? Yes: NPRS scale: 3/10 Pain location: both ankles Pain description: burning Aggravating factors: walking Relieving factors: rest  PRECAUTIONS: Fall  PATIENT GOALS "to be able to walk without help and without pain"  OBJECTIVE:   TODAY'S TREATMENT:  THER ACT:  [facility] PT Assessment - 02/26/22 1110       Ambulation/Gait   Gait velocity 32.8 ft over 13.96 sec = 2.35 ft/sec      Standardized Balance Assessment   Standardized Balance Assessment Timed Up and Go Test;Five Times Sit to Stand    Five times sit to stand comments  15.28 sec, BUE on arms of chair      Timed Up and Go Test   TUG Normal TUG    Normal TUG (seconds) 11.91   no AD           Static stance in // bars:  -rocker board fwd/back 3 x 30 sec with posterior LOB, no UE support and close SBA  -rocker board L/R 3 x 30 sec with LOB to the R, no UE support and close SBA  Added resistance with red theraband for side-steps with HEP   PATIENT EDUCATION: Education details: continue HEP, updated HEP Person educated: Patient Education method: Explanation, Demonstration, and Handouts Education comprehension: verbalized understanding   HOME EXERCISE PROGRAM: Access Code: [email or web address] URL: [email or web address] Date: 02/10/2022 Prepared by: Excell Seltzer  Exercises - Side Stepping with Resistance at Ankles and Counter Support  - 1 x daily - 7 x weekly - 3 sets - 10 reps - Forward Step Up with Counter Support  - 1 x daily - 7 x weekly - 3 sets - 10 reps - Towel Scrunches  - 1 x daily - 7 x weekly - 2 sets - 10 reps - Seated Ankle Alphabet  - 1 x daily - 7 x weekly - 1 sets - 10 reps - Mini Squat with Counter Support  - 1 x daily - 7 x weekly - 3 sets - 10 reps   GOALS: Goals reviewed with patient? Yes  SHORT TERM GOALS: Target date: 03/03/2022  Initiate HEP Baseline: initiated 8/8 Goal status: MET  2.  Pt will improve gait velocity to at least 2.5 ft/sec for  improved gait efficiency and performance at Supervision level with LRAD Baseline: 2.26 ft/sec S* with RW (8/1), 2.35 ft/sec (8/24) Goal status: IN PROGRESS  3.  Pt will improve normal TUG to less than or equal to 13 seconds for improved functional mobility and decreased fall risk. Baseline: 14 sec with RW (8/1), 11.91 sec with no AD (8/24) Goal status: MET  4.  Pt will improve Berg score to 40/56  for decreased fall risk Baseline: 34/56 (8/8) Goal status: INITIAL  5.  Pt will improve 5 x STS to less than or equal to 12 seconds to demonstrate improved functional strength and transfer efficiency.  Baseline: 13.8 (8/1), 15.28 sec (8/24) Goal status: IN PROGRESS   LONG TERM GOALS: Target date: 03/31/2022  Pt will be independent with balance and strengthening HEP for improved strength, balance, transfers and gait. Baseline:  Goal status: INITIAL  2.  Pt will improve gait velocity to at least 3.0 ft/sec for improved gait efficiency and performance at mod I level with LRAD Baseline: 2.26 ft/sec S* with RW (8/1), 2.35 ft/sec with RW (8/24) Goal status: REVISED  3.  Pt will improve normal TUG to less than or equal to  12 seconds for improved functional mobility and decreased fall risk. Baseline: 14 sec with RW (8/1), 11.91 sec with no AD (8/24) Goal status: INITIAL  4.  Pt will improve Berg score to 46/56 for decreased fall risk Baseline: 34/56 (8/8) Goal status: INITIAL  5.  Pt will improve 5 x STS to less than or equal to 10 seconds to demonstrate improved functional strength and transfer efficiency.  Baseline: 13.8 sec (8/1), 15.28 sec (8/24) Goal status: INITIAL   ASSESSMENT:  CLINICAL IMPRESSION: Emphasis of skilled PT session on working on initiating assessment of STG assessment. Pt exhibits improved gait speed from 2.26 ft/sec on initial eval with RW to 2.35 ft/sec this date without use of AD and exhibits decreased fall risk as evidenced by improved TUG score from 14 sec with  RW on initial eval to 11.91 sec with no AD. Upgraded HEP with addition of resistance to side-steps. Deferred adding further exercises to HEP at this time due to pt time constraints. Continue POC.   OBJECTIVE IMPAIRMENTS Abnormal gait, decreased balance, decreased coordination, decreased mobility, decreased strength, decreased safety awareness, and pain.   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   REHAB POTENTIAL: Good  CLINICAL DECISION MAKING: Stable/uncomplicated  EVALUATION COMPLEXITY: Moderate  PLAN: PT FREQUENCY: 2x/week  PT DURATION: 8 weeks  PLANNED INTERVENTIONS: Therapeutic exercises, Therapeutic activity, Neuromuscular re-education, Balance training, Gait training, Patient/Family education, Self Care, Joint mobilization, Stair training, DME instructions, Electrical stimulation, Cryotherapy, Moist heat, Taping, Manual therapy, and Re-evaluation  PLAN FOR NEXT SESSION:  Finish STG assessment (Berg, reassess 5xSTS), ongoing gait with no AD, add to HEP for balance and strengthening, obstacle course, LE coordination   Excell Seltzer, PT, DPT, CSRS 02/26/2022, 11:44 AM

## 2022-02-26 NOTE — Therapy (Signed)
OUTPATIENT SPEECH LANGUAGE PATHOLOGY TREATMENT   Patient Name: [name] MRN: [id number] DOB:February 18, 1941, 81 y.o., female Today's Date: 02/26/2022  PCP: none on file REFERRING PROVIDER: Courtney Heys, MD   End of Session - 02/26/22 1021     Visit Number 5    Number of Visits 17    Date for SLP Re-Evaluation 03/31/22    Progress Note Due on Visit 10    SLP Start Time 1021   Pt arrived late to therapy   SLP Stop Time  1100    SLP Time Calculation (min) 39 min    Activity Tolerance Patient tolerated treatment well                Past Medical History:  Diagnosis Date    Multiple pulmonary nodules, largest R mid lung 06/29/2013   Followed in Pulmonary clinic/ [facility]/ [facility] - see CXR 06/27/13  - CT chest 07/14/2013 > 1. No acute findings in the thorax to account for the patient's  symptoms.  2. However, there is a subsolid nodule in the   right lower lobe that has a ground-glass attenuation component  measuring 2.5 x 1.8 cm, and a small central solid component  measuring 4 mm. Initial follow-up by chest CT without    Adenocarcinoma of lung, stage 1 ([place])    Status post right lower lobectomy August 2015   Anxiety    Asthma    Atrophic vaginitis    Chronic cough 12/20/2018   Chronic iritis of left eye    Pupil stays dilated; nonreactive   Chronic kidney disease, stage 3b (HCC)    Colon polyp    Contact lens/glasses fitting    wears contacts or glasses   Cough variant asthma with component of uacs  05/31/2013   Followed in Pulmonary clinic/ [facility]/ [facility] in her 55's - Spirometry 02/23/13 wnl  - 07/03/2013 Sinus CT > Mild chronic sinus disease - No acute findings. Left-to-right nasal septal deviation of 3 mm. -med calendar 08/08/13 , redone 06/01/2016 and 02/22/2017  - Eos 4%   10/13/2013  > rec singulair daily  - FENO 05/12/2016  =   24 on singulair  - Allergy profile 05/12/2016 >   IgE  47 pos   Depression    Depression with anxiety    Dysuria  08/03/2019   Environmental allergies    Essential hypertension 02/23/2017   Changed losartan to avapro 02/22/2017 due to cough    GERD (gastroesophageal reflux disease)    Hypertension    Hypertriglyceridemia    Kidney stones    OA (osteoarthritis)    Osteopenia    PONV (postoperative nausea and vomiting)    S/P lobectomy of lung 02/12/2014   Status post total knee replacement, left 08/26/2018   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   Past Surgical History:  Procedure Laterality Date   APPENDECTOMY     COLONOSCOPY     EYE SURGERY Left    cataract removal   ORIF WRIST FRACTURE Left 08/04/2013   Procedure: OPEN REDUCTION INTERNAL FIXATION (ORIF) LEFT DISTAL RADIUS WRIST FRACTURE;  Surgeon: Wynonia Sours, MD;  Location: [place];  Service: Orthopedics;  Laterality: Left;   PARTIAL HYSTERECTOMY     TOTAL KNEE ARTHROPLASTY Left 08/26/2018   Procedure: TOTAL KNEE ARTHROPLASTY;  Surgeon: Netta Cedars, MD;  [facility]: [facility];  Service: Orthopedics;  Laterality: Left;   VIDEO ASSISTED THORACOSCOPY (VATS)/WEDGE RESECTION Right 02/12/2014   Procedure: RIGHT VIDEO ASSISTED THORACOSCOPY RIGHT  LOWER LOBE LUNG /WEDGE RESECTION,RIGHT THORACOTOMY WITH RIGHT LOWER LOBE LOBECTOMY & NODE DISSECTION;  Surgeon: Melrose Nakayama, MD;  Location: [place];  Service: Thoracic;  Laterality: Right;   VIDEO ASSISTED THORACOSCOPY (VATS)/WEDGE RESECTION Right 02/12/2014   VIDEO BRONCHOSCOPY N/A 02/12/2014   Procedure: VIDEO BRONCHOSCOPY;  Surgeon: Melrose Nakayama, MD;  Location: [place];  Service: Thoracic;  Laterality: N/A;   Patient Active Problem List   Diagnosis Date Noted   Acute ischemic left MCA stroke ([place]) 01/28/2022   Aphasia as late effect of cerebrovascular accident (CVA) 01/28/2022   Right hemiparesis ([place]) 01/28/2022   Abnormal gait due to muscle weakness 01/28/2022   Insomnia 10/13/2021   Frequency of urination 10/13/2021   Vitamin D deficiency 10/13/2021   Chronic renal failure  10/13/2021   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[place]) 09/24/2021   Aphasia due to acute cerebrovascular accident (CVA) ([place]) 09/21/2021   Chronic kidney disease, stage 3b ([place]) 09/21/2021   Depression with anxiety 09/21/2021   Hypertension 09/21/2021   Asthma, chronic 09/21/2021   Fall at home, initial encounter 09/21/2021   Right ankle pain 09/21/2021   Dysuria 08/03/2019   Chronic cough 12/20/2018   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   Status post total knee replacement, left 08/26/2018   Essential hypertension 02/23/2017   Adenocarcinoma of lung, stage 1 ([place]) 05/08/2014   S/P lobectomy of lung 02/12/2014   Asthma 01/30/2014    Multiple pulmonary nodules, largest R mid lung 06/29/2013   Cough variant asthma with component of uacs  05/31/2013    ONSET DATE: March 2023, referral July 2023   REFERRING DIAG:  I69.320 (ICD-10-CM) - Aphasia as late effect of cerebrovascular accident (CVA)  I63.512 (ICD-10-CM) - Acute ischemic left MCA stroke (HCC)    THERAPY DIAG:  Apraxia  Aphasia  Cognitive communication deficit  Rationale for Evaluation and Treatment Rehabilitation  SUBJECTIVE:   SUBJECTIVE STATEMENT: "This is stuck: re: rollator wheel. SLP was able to fix.   PAIN:  Are you having pain? No   OBJECTIVE:  TODAY'S TREATMENT:  02-26-22: Pt arrives with HEP partially completed and personal photo albums. Using albums, SLP leads pt through discourse level speech task in which pt provides synopsis of photo, with usual halting speech and anomia demonstrated. Benefits from verbal A for use of anomia strategies to aid in word recall. Use of response elaboration training skilled intervention to target expanded verbal expression. Answers questions appropriately, with usual vague language. Benefits from questioning cues and feedback on response. Has difficulty naming grandchildren. Updated HEP to find their names and practice writing them.   02-24-22:  Usual vague language and anomia demonstrated in opening conversation in response to SLP questions. Given usual extended time and min-A, able to elaborate responses to more comprehensively answer SLP ?s. Targeted naming and writing through structured language task in which pt generated related words to given topic. Topic 1, pt generates x2 related words IND, with extended time provided, additional x1 with usual mod-A of semantic cues. Pt requires visual model and usual min-A to use for writing list of x5 words. Generated grocery list in which pt generated x17 frequently purchased items with usual min-A. Updated HEP.   PATIENT EDUCATION: Education details: see above Person educated: Patient Education method: Customer service manager Education comprehension: verbalized understanding, returned demonstration, and needs further education   GOALS: Goals reviewed with patient? Yes  SHORT TERM GOALS: Target date: 03/03/22  Pt will verbalize 7 items in personally relevant categories with  occasional min-A over 2 therapy sessions Baseline: 02-24-22; 02/26/22 Goal status: MET  2.  Pt will correct semantic paraphasia given usual mod-A in 70% of opportunities during structured task over 2 sessions.  Baseline: 02-24-22; 02-26-22 Goal status: MET  3.  Pt will write x5 item list with 80% accuracy, given usual mod-A over 2 sessions.  Baseline: 02-24-22; 02-26-22 Goal status: NOT MET  4.  Pt will successfully use trained anomia compensation in x3 opportunities across 2 therapy sessions with occasioanl min-A.  Baseline: 02-24-22; 02-26-22 Goal status: PARTIALLY MET   LONG TERM GOALS: Target date: 03/31/2022  Pt will identify, and correct, 50% of semantic paraphasias during structured task with occasional min-A over 2 sessions.  Baseline:  Goal status: IN PROGRESS  2.  Pt will report improvement of 2 points via CPIB PROM by d/c Baseline: 15 Goal status: IN PROGRESS  3.  Pt will carryover dysnomia  compensations to conversational speech, resulting in strategy use in 70% of opportunities over 15 minute conversation Baseline:  Goal status: IN PROGRESS  4.  Pt will write x10 phrases, with mod-I, with 80% accuracy over 2 sessions.   Baseline:  Goal status: IN PROGRESS  ASSESSMENT:  CLINICAL IMPRESSION: Patient is a 81 y.o. F presenting with mild-moderate expressive aphasia. Receptive language skills appear to be within gross normal limits. Spontaneous speech is characterized by usual vague language, anomia and frequent paraphasias impacting clarity and cohesiveness of message. Emerging awareness of paraphasias exhibited. Written expression impaired, with SLP initiating skilled interventions and strategies to aid in abilites for desired functional tasks at home such as grocery list creation. SLP has initiated structured language tasks to aid in improved expressive language and enhanced communication confidence and efficacy. I recommend continued skilled ST to address expressive aphasia.  OBJECTIVE IMPAIRMENTS include expressive language, aphasia, and apraxia. These impairments are limiting patient from effectively communicating at home and in community. Factors affecting potential to achieve goals and functional outcome are family/community support. Patient will benefit from skilled SLP services to address above impairments and improve overall function.  REHAB POTENTIAL: Good  PLAN: SLP FREQUENCY: 2x/week  SLP DURATION: 8 weeks  PLANNED INTERVENTIONS: Language facilitation, Cueing hierachy, Internal/external aids, Functional tasks, Multimodal communication approach, SLP instruction and feedback, Compensatory strategies, and Patient/family education    Su Monks, CCC-SLP 02/26/2022, 10:22 AM

## 2022-03-03 ENCOUNTER — Ambulatory Visit: Payer: Medicare Other | Admitting: Occupational Therapy

## 2022-03-03 DIAGNOSIS — I69351 Hemiplegia and hemiparesis following cerebral infarction affecting right dominant side: Secondary | ICD-10-CM | POA: Diagnosis not present

## 2022-03-03 DIAGNOSIS — G8191 Hemiplegia, unspecified affecting right dominant side: Secondary | ICD-10-CM | POA: Diagnosis not present

## 2022-03-03 DIAGNOSIS — M6281 Muscle weakness (generalized): Secondary | ICD-10-CM

## 2022-03-03 DIAGNOSIS — I69190 Apraxia following nontraumatic intracerebral hemorrhage: Secondary | ICD-10-CM | POA: Diagnosis not present

## 2022-03-03 DIAGNOSIS — I63512 Cerebral infarction due to unspecified occlusion or stenosis of left middle cerebral artery: Secondary | ICD-10-CM | POA: Diagnosis not present

## 2022-03-03 DIAGNOSIS — R2689 Other abnormalities of gait and mobility: Secondary | ICD-10-CM | POA: Diagnosis not present

## 2022-03-03 DIAGNOSIS — R4701 Aphasia: Secondary | ICD-10-CM | POA: Diagnosis not present

## 2022-03-03 DIAGNOSIS — R482 Apraxia: Secondary | ICD-10-CM | POA: Diagnosis not present

## 2022-03-03 NOTE — Therapy (Signed)
OUTPATIENT OCCUPATIONAL THERAPY NEURO EVALUATION  Patient Name: [name] MRN: [id number] DOB:[birth year], 81 y.o., female Today's Date: 03/04/2022  PCP: NA REFERRING PROVIDER: Courtney Heys, MD    OT End of Session - 03/04/22 1621     Visit Number 2    Number of Visits 9    Authorization Type UHC Medicare    Progress Note Due on Visit 10    OT Start Time 1104    OT Stop Time 1144    OT Time Calculation (min) 40 min    Activity Tolerance Patient tolerated treatment well    Behavior During Therapy WFL for tasks assessed/performed              Past Medical History:  Diagnosis Date    Multiple pulmonary nodules, largest R mid lung 06/29/2013   Followed in Pulmonary clinic/ [facility]/ [place] - see CXR 06/27/13  - CT chest 07/14/2013 > 1. No acute findings in the thorax to account for the patient's  symptoms.  2. However, there is a subsolid nodule in the   right lower lobe that has a ground-glass attenuation component  measuring 2.5 x 1.8 cm, and a small central solid component  measuring 4 mm. Initial follow-up by chest CT without    Adenocarcinoma of lung, stage 1 ([place])    Status post right lower lobectomy August 2015   Anxiety    Asthma    Atrophic vaginitis    Chronic cough 12/20/2018   Chronic iritis of left eye    Pupil stays dilated; nonreactive   Chronic kidney disease, stage 3b (HCC)    Colon polyp    Contact lens/glasses fitting    wears contacts or glasses   Cough variant asthma with component of uacs  05/31/2013   Followed in Pulmonary clinic/ [facility]/ [place] in her 60's - Spirometry 02/23/13 wnl  - 07/03/2013 Sinus CT > Mild chronic sinus disease - No acute findings. Left-to-right nasal septal deviation of 3 mm. -med calendar 08/08/13 , redone 06/01/2016 and 02/22/2017  - Eos 4%   10/13/2013  > rec singulair daily  - FENO 05/12/2016  =   24 on singulair  - Allergy profile 05/12/2016 >   IgE  47 pos   Depression    Depression with anxiety     Dysuria 08/03/2019   Environmental allergies    Essential hypertension 02/23/2017   Changed losartan to avapro 02/22/2017 due to cough    GERD (gastroesophageal reflux disease)    Hypertension    Hypertriglyceridemia    Kidney stones    OA (osteoarthritis)    Osteopenia    PONV (postoperative nausea and vomiting)    S/P lobectomy of lung 02/12/2014   Status post total knee replacement, left 08/26/2018   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   Past Surgical History:  Procedure Laterality Date   APPENDECTOMY     COLONOSCOPY     EYE SURGERY Left    cataract removal   ORIF WRIST FRACTURE Left 08/04/2013   Procedure: OPEN REDUCTION INTERNAL FIXATION (ORIF) LEFT DISTAL RADIUS WRIST FRACTURE;  Surgeon: Wynonia Sours, MD;  Location: [place];  Service: Orthopedics;  Laterality: Left;   PARTIAL HYSTERECTOMY     TOTAL KNEE ARTHROPLASTY Left 08/26/2018   Procedure: TOTAL KNEE ARTHROPLASTY;  Surgeon: Netta Cedars, MD;  [facility];  Service: Orthopedics;  Laterality: Left;   VIDEO ASSISTED THORACOSCOPY (VATS)/WEDGE RESECTION Right 02/12/2014   Procedure: RIGHT VIDEO ASSISTED THORACOSCOPY RIGHT LOWER  LOBE LUNG /WEDGE RESECTION,RIGHT THORACOTOMY WITH RIGHT LOWER LOBE LOBECTOMY & NODE DISSECTION;  Surgeon: Melrose Nakayama, MD;  Location: [place];  Service: Thoracic;  Laterality: Right;   VIDEO ASSISTED THORACOSCOPY (VATS)/WEDGE RESECTION Right 02/12/2014   VIDEO BRONCHOSCOPY N/A 02/12/2014   Procedure: VIDEO BRONCHOSCOPY;  Surgeon: Melrose Nakayama, MD;  Location: [place];  Service: Thoracic;  Laterality: N/A;   Patient Active Problem List   Diagnosis Date Noted   Acute ischemic left MCA stroke ([place]) 01/28/2022   Aphasia as late effect of cerebrovascular accident (CVA) 01/28/2022   Right hemiparesis ([place]) 01/28/2022   Abnormal gait due to muscle weakness 01/28/2022   Insomnia 10/13/2021   Frequency of urination 10/13/2021   Vitamin D deficiency 10/13/2021   Chronic renal  failure 10/13/2021   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[place]) 09/24/2021   Aphasia due to acute cerebrovascular accident (CVA) ([place]) 09/21/2021   Chronic kidney disease, stage 3b ([place]) 09/21/2021   Depression with anxiety 09/21/2021   Hypertension 09/21/2021   Asthma, chronic 09/21/2021   Fall at home, initial encounter 09/21/2021   Right ankle pain 09/21/2021   Dysuria 08/03/2019   Chronic cough 12/20/2018   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   Status post total knee replacement, left 08/26/2018   Essential hypertension 02/23/2017   Adenocarcinoma of lung, stage 1 ([place]) 05/08/2014   S/P lobectomy of lung 02/12/2014   Asthma 01/30/2014    Multiple pulmonary nodules, largest R mid lung 06/29/2013   Cough variant asthma with component of uacs  05/31/2013    ONSET DATE: 01/28/2022   REFERRING DIAG: G81.91 (ICD-10-CM) - Right hemiparesis (HCC) I63.512 (ICD-10-CM) - Acute ischemic left MCA stroke (HCC)   THERAPY DIAG:  Muscle weakness (generalized)  Apraxia following nontraumatic intracerebral hemorrhage  Rationale for Evaluation and Treatment Rehabilitation  SUBJECTIVE:   SUBJECTIVE STATEMENT: Pt report she has a caregiver who assist at home Pt accompanied by: self  PERTINENT HISTORY:  hx of hypertension, thrombotic stroke involving left middle cerebral artery, asthma, lung cancer, coronary calcification seen on CT and CKD    PRECAUTIONS: Fall  WEIGHT BEARING RESTRICTIONS No  PAIN:  Are you having pain? Yes - right ankle - throbbing  FALLS: Has patient fallen in last 6 months? Yes [place] last week.  Fell backward, could not get up without help  LIVING ENVIRONMENT: Lives with: lives with their family and lives alone Lives in: House/apartment  Has following equipment at home: Gilford Rile - 4 wheeled, Electronics engineer, and Grab bars  PLOF: Independent with basic ADLs  PATIENT GOALS I want to be more independent  OBJECTIVE:   Today's  treatment: Discussion with pt regarding safety for shower transfers and beginning to use shower seat to sit down and bathe rather than allowing her aide to perform for her. Pt practiced doffing the ankle / foot brace, pt required mod-max assist and verbal cues, pt was cues to prop foot up on knee for increased ease Task took pt the rest of the session. Pt was encouraged to perform portions of task at home with her aide.  PATIENT EDUCATION: Education details:donning/ doffing foot brace and shoe Person educated: Patient Education method: Explanation, demonstration, max v.c  Education comprehension: verbalized understanding, , mod assist   HOME EXERCISE PROGRAM: TBD    GOALS: Goals reviewed with patient? Yes  SHORT TERM GOALS: Target date: 03/08/22  Patient will complete a home activity program designed to decrease level of care provided by paid caregivers Baseline: Goal status: INITIAL  2.  Patient will transfer into / out of shower with distant supervision Baseline:  Goal status: INITIAL  3.  Patient will wash her back with AE without physical assistance Baseline:  Goal status: INITIAL     LONG TERM GOALS: Target date: 04/07/22  Patient will reduce at least 6 hours total of paid caregiver time Baseline:  Goal status: INITIAL  2.  Patient will demonstrate awareness of return to driving recommendations Baseline:  Goal status: INITIAL  3.  Patient will dress herself with modified independence including right ankle brace Baseline:  Goal status: INITIAL  4.  Patient will demonstrate improved stand balance as evidenced by standing without UE support during functional activities.   Baseline:  Goal status: INITIAL   ASSESSMENT:  CLINICAL IMPRESSION: Pt will benefit from reinforcement of ADL strategies to maximize safety and indpendendence. PERFORMANCE DEFICITS in functional skills including coordination, dexterity, proprioception, sensation, tone, ROM, strength,  flexibility, FMC, GMC, and UE functional use,   IMPAIRMENTS are limiting patient from ADLs and IADLs.   COMORBIDITIES may have co-morbidities  that affects occupational performance. Patient will benefit from skilled OT to address above impairments and improve overall function.  MODIFICATION OR ASSISTANCE TO COMPLETE EVALUATION: Min-Moderate modification of tasks or assist with assess necessary to complete an evaluation.  OT OCCUPATIONAL PROFILE AND HISTORY: Detailed assessment: Review of records and additional review of physical, cognitive, psychosocial history related to current functional performance.  CLINICAL DECISION MAKING: Moderate - several treatment options, min-mod task modification necessary  REHAB POTENTIAL: Good  EVALUATION COMPLEXITY: Moderate    PLAN: OT FREQUENCY: 1x/week  OT DURATION: 8 weeks  PLANNED INTERVENTIONS: self care/ADL training, therapeutic exercise, therapeutic activity, neuromuscular re-education, balance training, functional mobility training, aquatic therapy, patient/family education, visual/perceptual remediation/compensation, and DME and/or AE instructions  RECOMMENDED OTHER SERVICES: NA  CONSULTED AND AGREED WITH PLAN OF CARE: Patient  PLAN FOR NEXT SESSION: Discuss AE for shower to increase independence, stand balance with decreased UE support   Blythe Veach, OT 03/04/2022, 4:22 PM

## 2022-03-04 ENCOUNTER — Ambulatory Visit: Payer: Medicare Other | Admitting: Speech Pathology

## 2022-03-04 ENCOUNTER — Ambulatory Visit: Payer: Medicare Other | Admitting: Physical Therapy

## 2022-03-04 NOTE — Therapy (Deleted)
OUTPATIENT SPEECH LANGUAGE PATHOLOGY TREATMENT   Patient Name: [name] MRN: [id number] DOB:1941/04/18, 81 y.o., female Today's Date: 03/04/2022  PCP: none on file REFERRING PROVIDER: Courtney Heys, MD        Past Medical History:  Diagnosis Date    Multiple pulmonary nodules, largest R mid lung 06/29/2013   Followed in Pulmonary clinic/ [facility]/ [place] - see CXR 06/27/13  - CT chest 07/14/2013 > 1. No acute findings in the thorax to account for the patient's  symptoms.  2. However, there is a subsolid nodule in the   right lower lobe that has a ground-glass attenuation component  measuring 2.5 x 1.8 cm, and a small central solid component  measuring 4 mm. Initial follow-up by chest CT without    Adenocarcinoma of lung, stage 1 ([place])    Status post right lower lobectomy August 2015   Anxiety    Asthma    Atrophic vaginitis    Chronic cough 12/20/2018   Chronic iritis of left eye    Pupil stays dilated; nonreactive   Chronic kidney disease, stage 3b (HCC)    Colon polyp    Contact lens/glasses fitting    wears contacts or glasses   Cough variant asthma with component of uacs  05/31/2013   Followed in Pulmonary clinic/ [facility]/ [place] in her 46's - Spirometry 02/23/13 wnl  - 07/03/2013 Sinus CT > Mild chronic sinus disease - No acute findings. Left-to-right nasal septal deviation of 3 mm. -med calendar 08/08/13 , redone 06/01/2016 and 02/22/2017  - Eos 4%   10/13/2013  > rec singulair daily  - FENO 05/12/2016  =   24 on singulair  - Allergy profile 05/12/2016 >   IgE  47 pos   Depression    Depression with anxiety    Dysuria 08/03/2019   Environmental allergies    Essential hypertension 02/23/2017   Changed losartan to avapro 02/22/2017 due to cough    GERD (gastroesophageal reflux disease)    Hypertension    Hypertriglyceridemia    Kidney stones    OA (osteoarthritis)    Osteopenia    PONV (postoperative nausea and vomiting)    S/P lobectomy of lung  02/12/2014   Status post total knee replacement, left 08/26/2018   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   Past Surgical History:  Procedure Laterality Date   APPENDECTOMY     COLONOSCOPY     EYE SURGERY Left    cataract removal   ORIF WRIST FRACTURE Left 08/04/2013   Procedure: OPEN REDUCTION INTERNAL FIXATION (ORIF) LEFT DISTAL RADIUS WRIST FRACTURE;  Surgeon: Wynonia Sours, MD;  Location: [place];  Service: Orthopedics;  Laterality: Left;   PARTIAL HYSTERECTOMY     TOTAL KNEE ARTHROPLASTY Left 08/26/2018   Procedure: TOTAL KNEE ARTHROPLASTY;  Surgeon: Netta Cedars, MD;  [facility];  Service: Orthopedics;  Laterality: Left;   VIDEO ASSISTED THORACOSCOPY (VATS)/WEDGE RESECTION Right 02/12/2014   Procedure: RIGHT VIDEO ASSISTED THORACOSCOPY RIGHT LOWER LOBE LUNG /WEDGE RESECTION,RIGHT THORACOTOMY WITH RIGHT LOWER LOBE LOBECTOMY & NODE DISSECTION;  Surgeon: Melrose Nakayama, MD;  Location: [place];  Service: Thoracic;  Laterality: Right;   VIDEO ASSISTED THORACOSCOPY (VATS)/WEDGE RESECTION Right 02/12/2014   VIDEO BRONCHOSCOPY N/A 02/12/2014   Procedure: VIDEO BRONCHOSCOPY;  Surgeon: Melrose Nakayama, MD;  Location: [place];  Service: Thoracic;  Laterality: N/A;   Patient Active Problem List   Diagnosis Date Noted   Acute ischemic left MCA stroke ([place]) 01/28/2022  Aphasia as late effect of cerebrovascular accident (CVA) 01/28/2022   Right hemiparesis ([place]) 01/28/2022   Abnormal gait due to muscle weakness 01/28/2022   Insomnia 10/13/2021   Frequency of urination 10/13/2021   Vitamin D deficiency 10/13/2021   Chronic renal failure 10/13/2021   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[place]) 09/24/2021   Aphasia due to acute cerebrovascular accident (CVA) ([place]) 09/21/2021   Chronic kidney disease, stage 3b ([place]) 09/21/2021   Depression with anxiety 09/21/2021   Hypertension 09/21/2021   Asthma, chronic 09/21/2021   Fall at home, initial  encounter 09/21/2021   Right ankle pain 09/21/2021   Dysuria 08/03/2019   Chronic cough 12/20/2018   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   Status post total knee replacement, left 08/26/2018   Essential hypertension 02/23/2017   Adenocarcinoma of lung, stage 1 ([place]) 05/08/2014   S/P lobectomy of lung 02/12/2014   Asthma 01/30/2014    Multiple pulmonary nodules, largest R mid lung 06/29/2013   Cough variant asthma with component of uacs  05/31/2013    ONSET DATE: March 2023, referral July 2023   REFERRING DIAG:  U13.244 (ICD-10-CM) - Aphasia as late effect of cerebrovascular accident (CVA)  I63.512 (ICD-10-CM) - Acute ischemic left MCA stroke ([place])    THERAPY DIAG:  No diagnosis found.  Rationale for Evaluation and Treatment Rehabilitation  SUBJECTIVE:   SUBJECTIVE STATEMENT: ***  PAIN:  Are you having pain? No   OBJECTIVE:  TODAY'S TREATMENT:  03-04-22: ***  02-26-22: Pt arrives with HEP partially completed and personal photo albums. Using albums, SLP leads pt through discourse level speech task in which pt provides synopsis of photo, with usual halting speech and anomia demonstrated. Benefits from verbal A for use of anomia strategies to aid in word recall. Use of response elaboration training skilled intervention to target expanded verbal expression. Answers questions appropriately, with usual vague language. Benefits from questioning cues and feedback on response. Has difficulty naming grandchildren. Updated HEP to find their names and practice writing them.   02-24-22: Usual vague language and anomia demonstrated in opening conversation in response to SLP questions. Given usual extended time and min-A, able to elaborate responses to more comprehensively answer SLP ?s. Targeted naming and writing through structured language task in which pt generated related words to given topic. Topic 1, pt generates x2 related words IND, with extended time provided, additional x1 with  usual mod-A of semantic cues. Pt requires visual model and usual min-A to use for writing list of x5 words. Generated grocery list in which pt generated x17 frequently purchased items with usual min-A. Updated HEP.   PATIENT EDUCATION: Education details: see above Person educated: Patient Education method: Customer service manager Education comprehension: verbalized understanding, returned demonstration, and needs further education   GOALS: Goals reviewed with patient? Yes  SHORT TERM GOALS: Target date: 03/03/22  Pt will verbalize 7 items in personally relevant categories with occasional min-A over 2 therapy sessions Baseline: 02-24-22; 02/26/22 Goal status: MET  2.  Pt will correct semantic paraphasia given usual mod-A in 70% of opportunities during structured task over 2 sessions.  Baseline: 02-24-22; 02-26-22 Goal status: MET  3.  Pt will write x5 item list with 80% accuracy, given usual mod-A over 2 sessions.  Baseline: 02-24-22; 02-26-22 Goal status: NOT MET  4.  Pt will successfully use trained anomia compensation in x3 opportunities across 2 therapy sessions with occasioanl min-A.  Baseline: 02-24-22; 02-26-22 Goal status: PARTIALLY MET   LONG TERM GOALS: Target  date: 03/31/2022  Pt will identify, and correct, 50% of semantic paraphasias during structured task with occasional min-A over 2 sessions.  Baseline:  Goal status: IN PROGRESS  2.  Pt will report improvement of 2 points via CPIB PROM by d/c Baseline: 15 Goal status: IN PROGRESS  3.  Pt will carryover dysnomia compensations to conversational speech, resulting in strategy use in 70% of opportunities over 15 minute conversation Baseline:  Goal status: IN PROGRESS  4.  Pt will write x10 phrases, with mod-I, with 80% accuracy over 2 sessions.   Baseline:  Goal status: IN PROGRESS  ASSESSMENT:  CLINICAL IMPRESSION: Patient is a 81 y.o. F presenting with mild-moderate expressive aphasia. Receptive language  skills appear to be within gross normal limits. Spontaneous speech is characterized by usual vague language, anomia and frequent paraphasias impacting clarity and cohesiveness of message. Emerging awareness of paraphasias exhibited. Written expression impaired, with SLP initiating skilled interventions and strategies to aid in abilites for desired functional tasks at home such as grocery list creation. SLP has initiated structured language tasks to aid in improved expressive language and enhanced communication confidence and efficacy. I recommend continued skilled ST to address expressive aphasia.  OBJECTIVE IMPAIRMENTS include expressive language, aphasia, and apraxia. These impairments are limiting patient from effectively communicating at home and in community. Factors affecting potential to achieve goals and functional outcome are family/community support. Patient will benefit from skilled SLP services to address above impairments and improve overall function.  REHAB POTENTIAL: Good  PLAN: SLP FREQUENCY: 2x/week  SLP DURATION: 8 weeks  PLANNED INTERVENTIONS: Language facilitation, Cueing hierachy, Internal/external aids, Functional tasks, Multimodal communication approach, SLP instruction and feedback, Compensatory strategies, and Patient/family education    Su Monks, CCC-SLP 03/04/2022, 7:54 AM

## 2022-03-06 ENCOUNTER — Ambulatory Visit: Payer: Medicare Other | Admitting: Physical Therapy

## 2022-03-06 ENCOUNTER — Ambulatory Visit: Payer: Medicare Other | Admitting: Speech Pathology

## 2022-03-10 ENCOUNTER — Encounter: Payer: Self-pay | Admitting: Occupational Therapy

## 2022-03-10 ENCOUNTER — Ambulatory Visit: Payer: Medicare Other | Attending: Physical Medicine and Rehabilitation | Admitting: Occupational Therapy

## 2022-03-10 DIAGNOSIS — R278 Other lack of coordination: Secondary | ICD-10-CM | POA: Diagnosis not present

## 2022-03-10 DIAGNOSIS — R2689 Other abnormalities of gait and mobility: Secondary | ICD-10-CM | POA: Diagnosis not present

## 2022-03-10 DIAGNOSIS — R4701 Aphasia: Secondary | ICD-10-CM | POA: Diagnosis not present

## 2022-03-10 DIAGNOSIS — I69351 Hemiplegia and hemiparesis following cerebral infarction affecting right dominant side: Secondary | ICD-10-CM | POA: Diagnosis not present

## 2022-03-10 DIAGNOSIS — Z9181 History of falling: Secondary | ICD-10-CM | POA: Diagnosis not present

## 2022-03-10 DIAGNOSIS — R482 Apraxia: Secondary | ICD-10-CM | POA: Diagnosis not present

## 2022-03-10 DIAGNOSIS — M6281 Muscle weakness (generalized): Secondary | ICD-10-CM | POA: Diagnosis not present

## 2022-03-10 DIAGNOSIS — R41841 Cognitive communication deficit: Secondary | ICD-10-CM | POA: Insufficient documentation

## 2022-03-10 NOTE — Patient Instructions (Signed)
  Local Driver Evaluation Programs:  Comprehensive Evaluation: includes clinical and in vehicle behind the wheel testing by OCCUPATIONAL THERAPIST. Programs have varying levels of adaptive controls available for trial.   [employer], [place] [address] [place], [place]  [address] [phone] or [phone] [email or web address] Evaluator:  Richelle Ito, OT/CDRS/CDI/SCDCM/[facility] [address] [place], [place] [address] [phone] [email or web address] Evaluators:  Bertram Savin, OT and Mertie Clause, OT  [facility]) [place] ([place]!!) Physical [place] [address] [place], [place]  [address] [phone] [email or web address] Evaluators:  Bernadene Bell, KT; Heron Sabins, KT;  Shirlee Latch, KT (KT=kiniesotherapist)   Clinical evaluations only:  Includes clinical testing, refers to other programs or local certified driving instructor for behind the wheel testing.  [facility] (outpatient Rehab) [place] [address] [place], [place] [address] [phone] for scheduling [email or web address] Evaluators:  Valentino Hue, OT; Haynes Hoehn, OT  Other area clinical evaluators available upon request including [facility], [facility] and [facility].       Resource List What is a Warden/ranger: Your Road Ahead - A Guide to [employer] Evaluations http://www.thehartford.[email or web address]/resources/mature-market-excellence/publications-[email or web address]-aging  Association for Musician - Disability and Driving Fact Sheets http://www.aded.net/?page=510  Driving after a Brain  Injury: Brain Injury Association of [employer] [place].be?A=SearchResult&SearchID=9495675&ObjectID=2758842&ObjectType=35  Driving with Adaptive Equipment: Chiropractor Association [email or web address]

## 2022-03-10 NOTE — Therapy (Signed)
OUTPATIENT OCCUPATIONAL THERAPY NEURO TREATMENT  Patient Name: [name] MRN: [id number] DOB:1940-12-19, 81 y.o., female Today's Date: 03/10/2022  PCP: NA REFERRING PROVIDER: Courtney Heys, MD    OT End of Session - 03/10/22 1235     Visit Number 3    Number of Visits 9    Authorization Type [employer]    Progress Note Due on Visit 10    OT Start Time 1233    OT Stop Time 1315    OT Time Calculation (min) 42 min    Activity Tolerance Patient tolerated treatment well    Behavior During Therapy WFL for tasks assessed/performed              Past Medical History:  Diagnosis Date    Multiple pulmonary nodules, largest R mid lung 06/29/2013   Followed in Pulmonary clinic/ [facility]/ [place] - see CXR 06/27/13  - CT chest 07/14/2013 > 1. No acute findings in the thorax to account for the patient's  symptoms.  2. However, there is a subsolid nodule in the   right lower lobe that has a ground-glass attenuation component  measuring 2.5 x 1.8 cm, and a small central solid component  measuring 4 mm. Initial follow-up by chest CT without    Adenocarcinoma of lung, stage 1 ([place])    Status post right lower lobectomy August 2015   Anxiety    Asthma    Atrophic vaginitis    Chronic cough 12/20/2018   Chronic iritis of left eye    Pupil stays dilated; nonreactive   Chronic kidney disease, stage 3b (HCC)    Colon polyp    Contact lens/glasses fitting    wears contacts or glasses   Cough variant asthma with component of uacs  05/31/2013   Followed in Pulmonary clinic/ [facility]/ [place] in her 67's - Spirometry 02/23/13 wnl  - 07/03/2013 Sinus CT > Mild chronic sinus disease - No acute findings. Left-to-right nasal septal deviation of 3 mm. -med calendar 08/08/13 , redone 06/01/2016 and 02/22/2017  - Eos 4%   10/13/2013  > rec singulair daily  - FENO 05/12/2016  =   24 on singulair  - Allergy profile 05/12/2016 >   IgE  47 pos   Depression    Depression with anxiety     Dysuria 08/03/2019   Environmental allergies    Essential hypertension 02/23/2017   Changed losartan to avapro 02/22/2017 due to cough    GERD (gastroesophageal reflux disease)    Hypertension    Hypertriglyceridemia    Kidney stones    OA (osteoarthritis)    Osteopenia    PONV (postoperative nausea and vomiting)    S/P lobectomy of lung 02/12/2014   Status post total knee replacement, left 08/26/2018   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   Past Surgical History:  Procedure Laterality Date   APPENDECTOMY     COLONOSCOPY     EYE SURGERY Left    cataract removal   ORIF WRIST FRACTURE Left 08/04/2013   Procedure: OPEN REDUCTION INTERNAL FIXATION (ORIF) LEFT DISTAL RADIUS WRIST FRACTURE;  Surgeon: Wynonia Sours, MD;  Location: [facility];  Service: Orthopedics;  Laterality: Left;   PARTIAL HYSTERECTOMY     TOTAL KNEE ARTHROPLASTY Left 08/26/2018   Procedure: TOTAL KNEE ARTHROPLASTY;  Surgeon: Netta Cedars, MD;  Location: [facility];  Service: Orthopedics;  Laterality: Left;   VIDEO ASSISTED THORACOSCOPY (VATS)/WEDGE RESECTION Right 02/12/2014   Procedure: RIGHT VIDEO ASSISTED THORACOSCOPY RIGHT LOWER  LOBE LUNG /WEDGE RESECTION,RIGHT THORACOTOMY WITH RIGHT LOWER LOBE LOBECTOMY & NODE DISSECTION;  Surgeon: Melrose Nakayama, MD;  Location: [facility];  Service: Thoracic;  Laterality: Right;   VIDEO ASSISTED THORACOSCOPY (VATS)/WEDGE RESECTION Right 02/12/2014   VIDEO BRONCHOSCOPY N/A 02/12/2014   Procedure: VIDEO BRONCHOSCOPY;  Surgeon: Melrose Nakayama, MD;  Location: [facility];  Service: Thoracic;  Laterality: N/A;   Patient Active Problem List   Diagnosis Date Noted   Acute ischemic left MCA stroke ([place]) 01/28/2022   Aphasia as late effect of cerebrovascular accident (CVA) 01/28/2022   Right hemiparesis ([place]) 01/28/2022   Abnormal gait due to muscle weakness 01/28/2022   Insomnia 10/13/2021   Frequency of urination 10/13/2021   Vitamin D deficiency 10/13/2021   Chronic renal  failure 10/13/2021   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[place]) 09/24/2021   Aphasia due to acute cerebrovascular accident (CVA) ([place]) 09/21/2021   Chronic kidney disease, stage 3b ([place]) 09/21/2021   Depression with anxiety 09/21/2021   Hypertension 09/21/2021   Asthma, chronic 09/21/2021   Fall at home, initial encounter 09/21/2021   Right ankle pain 09/21/2021   Dysuria 08/03/2019   Chronic cough 12/20/2018   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   Status post total knee replacement, left 08/26/2018   Essential hypertension 02/23/2017   Adenocarcinoma of lung, stage 1 ([place]) 05/08/2014   S/P lobectomy of lung 02/12/2014   Asthma 01/30/2014    Multiple pulmonary nodules, largest R mid lung 06/29/2013   Cough variant asthma with component of uacs  05/31/2013    ONSET DATE: 01/28/2022   REFERRING DIAG: G81.91 (ICD-10-CM) - Right hemiparesis (HCC) I63.512 (ICD-10-CM) - Acute ischemic left MCA stroke (HCC)   THERAPY DIAG:  Hemiplegia and hemiparesis following cerebral infarction affecting right dominant side (HCC)  Muscle weakness (generalized)  Rationale for Evaluation and Treatment Rehabilitation  SUBJECTIVE:   SUBJECTIVE STATEMENT: I fell at home and landed on my bottom and it's still sore Pt accompanied by: self  PERTINENT HISTORY:  hx of hypertension, thrombotic stroke involving left middle cerebral artery, asthma, lung cancer, coronary calcification seen on CT and CKD    PRECAUTIONS: Fall  WEIGHT BEARING RESTRICTIONS No  PAIN:  Are you having pain? Yes - right ankle - throbbing but better today, also pain at bottom from fall - OT not directly addressing  FALLS: Has patient fallen in last 6 months? Yes [place] last week.  Fell backward, could not get up without help  LIVING ENVIRONMENT: Lives with: lives alone with aide/caregiver Mon-Fri Lives in: House/apartment (one level home w/ 1 step to enter)   Has following equipment at home: Environmental consultant - 4  wheeled, Shower bench, and Grab bars  PLOF: Independent with basic ADLs  PATIENT GOALS I want to be more independent  OBJECTIVE:   Today's treatment: Pt reports she has been to sore (at bottom from fall) to practice donning Rt ankle brace, but I have been sitting to bathe self - aide just helps w/ back   Discussed A/E for bathing back and provided handout on LH sponge, and how/where to purchase. Pt already has built in shower seat and grab bars.  Pt issued driving evaluation info and discussed. Recommended discussing with referring MD at next appointment and discussed adaptations needed for return to driving  Standing at counter: retrieving cones and replacing on mid to high level shelf w/o UE support for dynamic standing. LE wt shifts for reaching slightly outside BOS to Rt/Lt sides w/o UE support and  close supervision. Side stepping along counter bilaterally w/o UE support, followed by walking forwards and backwards along counter w/o UE support  UBE x 6 min. Level 2 for strength/endurance    HOME EXERCISE PROGRAM: TBD    GOALS: Goals reviewed with patient? Yes  SHORT TERM GOALS: Target date: 03/08/22  Patient will complete a home activity program designed to decrease level of care provided by paid caregivers Baseline: Goal status: IN PROGRESS   2.  Patient will transfer into / out of shower with distant supervision Baseline:  Goal status: INITIAL  3.  Patient will wash her back with AE without physical assistance Baseline:  Goal status: IN PROGRESS     LONG TERM GOALS: Target date: 04/07/22  Patient will reduce at least 6 hours total of paid caregiver time Baseline:  Goal status: INITIAL  2.  Patient will demonstrate awareness of return to driving recommendations Baseline:  Goal status: IN PROGRESS  3.  Patient will dress herself with modified independence including right ankle brace Baseline:  Goal status: INITIAL  4.  Patient will demonstrate improved  stand balance as evidenced by standing without UE support during functional activities.   Baseline:  Goal status: IN PROGRESS   ASSESSMENT:  CLINICAL IMPRESSION: Pt will benefit from reinforcement of ADL strategies to maximize safety and indpendendence. Pt progressing slowly towards goals  PERFORMANCE DEFICITS in functional skills including coordination, dexterity, proprioception, sensation, tone, ROM, strength, flexibility, FMC, GMC, and UE functional use,   IMPAIRMENTS are limiting patient from ADLs and IADLs.   COMORBIDITIES may have co-morbidities  that affects occupational performance. Patient will benefit from skilled OT to address above impairments and improve overall function.  MODIFICATION OR ASSISTANCE TO COMPLETE EVALUATION: Min-Moderate modification of tasks or assist with assess necessary to complete an evaluation.  OT OCCUPATIONAL PROFILE AND HISTORY: Detailed assessment: Review of records and additional review of physical, cognitive, psychosocial history related to current functional performance.  CLINICAL DECISION MAKING: Moderate - several treatment options, min-mod task modification necessary  REHAB POTENTIAL: Good  EVALUATION COMPLEXITY: Moderate    PLAN: OT FREQUENCY: 1x/week  OT DURATION: 8 weeks  PLANNED INTERVENTIONS: self care/ADL training, therapeutic exercise, therapeutic activity, neuromuscular re-education, balance training, functional mobility training, aquatic therapy, patient/family education, visual/perceptual remediation/compensation, and DME and/or AE instructions  RECOMMENDED OTHER SERVICES: NA  CONSULTED AND AGREED WITH PLAN OF CARE: Patient  PLAN FOR NEXT SESSION: continue standing balance w/o UE support, address STG #1   Hans Eden, OT 03/10/2022, 12:36 PM

## 2022-03-11 ENCOUNTER — Ambulatory Visit: Payer: Medicare Other | Admitting: Speech Pathology

## 2022-03-11 ENCOUNTER — Ambulatory Visit: Payer: Medicare Other | Admitting: Physical Therapy

## 2022-03-11 DIAGNOSIS — Z9181 History of falling: Secondary | ICD-10-CM | POA: Diagnosis not present

## 2022-03-11 DIAGNOSIS — M6281 Muscle weakness (generalized): Secondary | ICD-10-CM

## 2022-03-11 DIAGNOSIS — R482 Apraxia: Secondary | ICD-10-CM | POA: Diagnosis not present

## 2022-03-11 DIAGNOSIS — R2689 Other abnormalities of gait and mobility: Secondary | ICD-10-CM | POA: Diagnosis not present

## 2022-03-11 DIAGNOSIS — I69351 Hemiplegia and hemiparesis following cerebral infarction affecting right dominant side: Secondary | ICD-10-CM

## 2022-03-11 DIAGNOSIS — R4701 Aphasia: Secondary | ICD-10-CM | POA: Diagnosis not present

## 2022-03-11 DIAGNOSIS — R278 Other lack of coordination: Secondary | ICD-10-CM | POA: Diagnosis not present

## 2022-03-11 NOTE — Therapy (Signed)
OUTPATIENT SPEECH LANGUAGE PATHOLOGY TREATMENT   Patient Name: [name] MRN: [id number] DOB:1940/12/05, 81 y.o., female Today's Date: 03/11/2022  PCP: none on file REFERRING PROVIDER: Courtney Heys, MD   End of Session - 03/11/22 1059     Visit Number 6    Number of Visits 17    Date for SLP Re-Evaluation 03/31/22    Progress Note Due on Visit 10    SLP Start Time 1059    SLP Stop Time  1145    SLP Time Calculation (min) 46 min    Activity Tolerance Patient tolerated treatment well                 Past Medical History:  Diagnosis Date    Multiple pulmonary nodules, largest R mid lung 06/29/2013   Followed in Pulmonary clinic/ [facility]/ [place] - see CXR 06/27/13  - CT chest 07/14/2013 > 1. No acute findings in the thorax to account for the patient's  symptoms.  2. However, there is a subsolid nodule in the   right lower lobe that has a ground-glass attenuation component  measuring 2.5 x 1.8 cm, and a small central solid component  measuring 4 mm. Initial follow-up by chest CT without    Adenocarcinoma of lung, stage 1 ([place])    Status post right lower lobectomy August 2015   Anxiety    Asthma    Atrophic vaginitis    Chronic cough 12/20/2018   Chronic iritis of left eye    Pupil stays dilated; nonreactive   Chronic kidney disease, stage 3b (HCC)    Colon polyp    Contact lens/glasses fitting    wears contacts or glasses   Cough variant asthma with component of uacs  05/31/2013   [facility] in her 27's - Spirometry 02/23/13 wnl  - 07/03/2013 Sinus CT > Mild chronic sinus disease - No acute findings. Left-to-right nasal septal deviation of 3 mm. -med calendar 08/08/13 , redone 06/01/2016 and 02/22/2017  - Eos 4%   10/13/2013  > rec singulair daily  - FENO 05/12/2016  =   24 on singulair  - Allergy profile 05/12/2016 >   IgE  47 pos   Depression    Depression with anxiety    Dysuria 08/03/2019   Environmental  allergies    Essential hypertension 02/23/2017   Changed losartan to avapro 02/22/2017 due to cough    GERD (gastroesophageal reflux disease)    Hypertension    Hypertriglyceridemia    Kidney stones    OA (osteoarthritis)    Osteopenia    PONV (postoperative nausea and vomiting)    S/P lobectomy of lung 02/12/2014   Status post total knee replacement, left 08/26/2018   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   Past Surgical History:  Procedure Laterality Date   APPENDECTOMY     COLONOSCOPY     EYE SURGERY Left    cataract removal   ORIF WRIST FRACTURE Left 08/04/2013   Procedure: OPEN REDUCTION INTERNAL FIXATION (ORIF) LEFT DISTAL RADIUS WRIST FRACTURE;  Surgeon: Wynonia Sours, MD;  Location: [facility];  Service: Orthopedics;  Laterality: Left;   PARTIAL HYSTERECTOMY     TOTAL KNEE ARTHROPLASTY Left 08/26/2018   Procedure: TOTAL KNEE ARTHROPLASTY;  Surgeon: Netta Cedars, MD;  [facility];  Service: Orthopedics;  Laterality: Left;   VIDEO ASSISTED THORACOSCOPY (VATS)/WEDGE RESECTION Right 02/12/2014   Procedure: RIGHT VIDEO ASSISTED THORACOSCOPY RIGHT LOWER LOBE LUNG /WEDGE RESECTION,RIGHT  THORACOTOMY WITH RIGHT LOWER LOBE LOBECTOMY & NODE DISSECTION;  Surgeon: Melrose Nakayama, MD;  Location: [facility];  Service: Thoracic;  Laterality: Right;   VIDEO ASSISTED THORACOSCOPY (VATS)/WEDGE RESECTION Right 02/12/2014   VIDEO BRONCHOSCOPY N/A 02/12/2014   Procedure: VIDEO BRONCHOSCOPY;  Surgeon: Melrose Nakayama, MD;  Location: [facility];  Service: Thoracic;  Laterality: N/A;   Patient Active Problem List   Diagnosis Date Noted   Acute ischemic left MCA stroke ([place]) 01/28/2022   Aphasia as late effect of cerebrovascular accident (CVA) 01/28/2022   Right hemiparesis ([place]) 01/28/2022   Abnormal gait due to muscle weakness 01/28/2022   Insomnia 10/13/2021   Frequency of urination 10/13/2021   Vitamin D deficiency 10/13/2021   Chronic renal failure 10/13/2021   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[place]) 09/24/2021   Aphasia due to acute cerebrovascular accident (CVA) ([place]) 09/21/2021   Chronic kidney disease, stage 3b ([place]) 09/21/2021   Depression with anxiety 09/21/2021   Hypertension 09/21/2021   Asthma, chronic 09/21/2021   Fall at home, initial encounter 09/21/2021   Right ankle pain 09/21/2021   Dysuria 08/03/2019   Chronic cough 12/20/2018   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   Status post total knee replacement, left 08/26/2018   Essential hypertension 02/23/2017   Adenocarcinoma of lung, stage 1 ([place]) 05/08/2014   S/P lobectomy of lung 02/12/2014   Asthma 01/30/2014    Multiple pulmonary nodules, largest R mid lung 06/29/2013   Cough variant asthma with component of uacs  05/31/2013    ONSET DATE: March 2023, referral July 2023   REFERRING DIAG:  I69.320 (ICD-10-CM) - Aphasia as late effect of cerebrovascular accident (CVA)  I63.512 (ICD-10-CM) - Acute ischemic left MCA stroke ([place])    THERAPY DIAG:  Aphasia  Apraxia  Rationale for Evaluation and Treatment Rehabilitation  SUBJECTIVE:   SUBJECTIVE STATEMENT: "It's going slow"   PAIN:  Are you having pain? No   OBJECTIVE:  TODAY'S TREATMENT:  03-11-22: Target word finding through variety of tasks, increasing in complexity. Given x4 items in category, pt able to ID correct category in 8/10 trials with rare repetition. Given a category, pt able to generate x5, x10, x10 across respective trials with usual semantic and phonemic cueing for initial trial faded to supervision-A for subsequent trials. Pt demonstrating self-correction of paraphasias throughout task in all opportunities, reports this has been improving at home. Demonstrates self-cueing when anomia occurs x3 during naming task. No instances of uncorrected paraphasias this date. Pt able to copy x10 items from SLP model accurately with rare self-correction required. Pt says "wow, that's much better!" Re:  her writing. Notable improvement in alternating attention and accuracy of written items this date vs prior writing attempts. Pt accurately writes x6 family member names, initial attempt. Given letters of target names in randomized order, pt able to organize into correct order and then copy for x4 names which unable to spell initially.    02-26-22: Pt arrives with HEP partially completed and personal photo albums. Using albums, SLP leads pt through discourse level speech task in which pt provides synopsis of photo, with usual halting speech and anomia demonstrated. Benefits from verbal A for use of anomia strategies to aid in word recall. Use of response elaboration training skilled intervention to target expanded verbal expression. Answers questions appropriately, with usual vague language. Benefits from questioning cues and feedback on response. Has difficulty naming grandchildren. Updated HEP to find their names and practice writing them.   02-24-22: Usual  vague language and anomia demonstrated in opening conversation in response to SLP questions. Given usual extended time and min-A, able to elaborate responses to more comprehensively answer SLP ?s. Targeted naming and writing through structured language task in which pt generated related words to given topic. Topic 1, pt generates x2 related words IND, with extended time provided, additional x1 with usual mod-A of semantic cues. Pt requires visual model and usual min-A to use for writing list of x5 words. Generated grocery list in which pt generated x17 frequently purchased items with usual min-A. Updated HEP.   PATIENT EDUCATION: Education details: see above Person educated: Patient Education method: Customer service manager Education comprehension: verbalized understanding, returned demonstration, and needs further education   GOALS: Goals reviewed with patient? Yes  SHORT TERM GOALS: Target date: 03/03/22  Pt will verbalize 7 items in  personally relevant categories with occasional min-A over 2 therapy sessions Baseline: 02-24-22; 02/26/22 Goal status: MET  2.  Pt will correct semantic paraphasia given usual mod-A in 70% of opportunities during structured task over 2 sessions.  Baseline: 02-24-22; 02-26-22 Goal status: MET  3.  Pt will write x5 item list with 80% accuracy, given usual mod-A over 2 sessions.  Baseline: 02-24-22; 02-26-22 Goal status: NOT MET  4.  Pt will successfully use trained anomia compensation in x3 opportunities across 2 therapy sessions with occasioanl min-A.  Baseline: 02-24-22; 02-26-22 Goal status: PARTIALLY MET   LONG TERM GOALS: Target date: 03/31/2022  Pt will identify, and correct, 50% of semantic paraphasias during structured task with occasional min-A over 2 sessions.  Baseline:  Goal status: IN PROGRESS  2.  Pt will report improvement of 2 points via CPIB PROM by d/c Baseline: 15 Goal status: IN PROGRESS  3.  Pt will carryover dysnomia compensations to conversational speech, resulting in strategy use in 70% of opportunities over 15 minute conversation Baseline:  Goal status: IN PROGRESS  4.  Pt will write x10 phrases, with mod-I, with 80% accuracy over 2 sessions.   Baseline:  Goal status: IN PROGRESS  ASSESSMENT:  CLINICAL IMPRESSION: Patient is a 81 y.o. F presenting with mild-moderate expressive aphasia. Receptive language skills appear to be within gross normal limits. Spontaneous speech is characterized by usual vague language, anomia and frequent paraphasias impacting clarity and cohesiveness of message. Emerging awareness of paraphasias exhibited. Written expression impaired, with SLP initiating skilled interventions and strategies to aid in abilites for desired functional tasks at home such as grocery list creation. SLP has initiated structured language tasks to aid in improved expressive language and enhanced communication confidence and efficacy. I recommend continued skilled  ST to address expressive aphasia.  OBJECTIVE IMPAIRMENTS include expressive language, aphasia, and apraxia. These impairments are limiting patient from effectively communicating at home and in community. Factors affecting potential to achieve goals and functional outcome are family/community support. Patient will benefit from skilled SLP services to address above impairments and improve overall function.  REHAB POTENTIAL: Good  PLAN: SLP FREQUENCY: 2x/week  SLP DURATION: 8 weeks  PLANNED INTERVENTIONS: Language facilitation, Cueing hierachy, Internal/external aids, Functional tasks, Multimodal communication approach, SLP instruction and feedback, Compensatory strategies, and Patient/family education    Su Monks, CCC-SLP 03/11/2022, 10:59 AM

## 2022-03-11 NOTE — Patient Instructions (Signed)
Emily __________  ____________  ____________  Davy Pique __________  ____________  ____________  Randall Hiss __________  ____________  ____________  Olen Cordial __________  ____________  ____________  Merrily Pew __________  ____________  ____________  Suezanne Jacquet __________  ____________  ____________  Estill Bamberg __________  ____________  ____________  Danae Chen __________  ____________  ____________  Aiden __________  ____________  ____________  Benson __________  ____________  ____________

## 2022-03-11 NOTE — Therapy (Signed)
OUTPATIENT PHYSICAL THERAPY NEURO THERAPY   Patient Name: [name] MRN: [id number] DOB:18-Dec-1940, 81 y.o., female Today's Date: 03/11/2022   PCP: Pt unable to provide information and unable to locate in chart. REFERRING PROVIDER: Courtney Heys, MD    PT End of Session - 03/11/22 1018     Visit Number 8    Number of Visits 17    Date for PT Re-Evaluation 03/31/22    Authorization Type [employer]    Progress Note Due on Visit 10    PT Start Time 1015    PT Stop Time 1058    PT Time Calculation (min) 43 min    Equipment Utilized During Treatment Gait belt    Activity Tolerance Patient tolerated treatment well    Behavior During Therapy WFL for tasks assessed/performed               Past Medical History:  Diagnosis Date    Multiple pulmonary nodules, largest R mid lung 06/29/2013   Followed in Pulmonary clinic/ [facility]/ [facility] - see CXR 06/27/13  - CT chest 07/14/2013 > 1. No acute findings in the thorax to account for the patient's  symptoms.  2. However, there is a subsolid nodule in the   right lower lobe that has a ground-glass attenuation component  measuring 2.5 x 1.8 cm, and a small central solid component  measuring 4 mm. Initial follow-up by chest CT without    Adenocarcinoma of lung, stage 1 ([place])    Status post right lower lobectomy August 2015   Anxiety    Asthma    Atrophic vaginitis    Chronic cough 12/20/2018   Chronic iritis of left eye    Pupil stays dilated; nonreactive   Chronic kidney disease, stage 3b (HCC)    Colon polyp    Contact lens/glasses fitting    wears contacts or glasses   Cough variant asthma with component of uacs  05/31/2013   Followed in [facility]monary clinic/  [facility] in her 89's - Spirometry 02/23/13 wnl  - 07/03/2013 Sinus CT > Mild chronic sinus disease - No acute findings. Left-to-right nasal septal deviation of 3 mm. -med calendar 08/08/13 , redone 06/01/2016 and 02/22/2017  - Eos 4%   10/13/2013  >  rec singulair daily  - FENO 05/12/2016  =   24 on singulair  - Allergy profile 05/12/2016 >   IgE  47 pos   Depression    Depression with anxiety    Dysuria 08/03/2019   Environmental allergies    Essential hypertension 02/23/2017   Changed losartan to avapro 02/22/2017 due to cough    GERD (gastroesophageal reflux disease)    Hypertension    Hypertriglyceridemia    Kidney stones    OA (osteoarthritis)    Osteopenia    PONV (postoperative nausea and vomiting)    S/P lobectomy of lung 02/12/2014   Status post total knee replacement, left 08/26/2018   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   Past Surgical History:  Procedure Laterality Date   APPENDECTOMY     COLONOSCOPY     EYE SURGERY Left    cataract removal   ORIF WRIST FRACTURE Left 08/04/2013   Procedure: OPEN REDUCTION INTERNAL FIXATION (ORIF) LEFT DISTAL RADIUS WRIST FRACTURE;  Surgeon: Wynonia Sours, MD;  Location: [facility];  Service: Orthopedics;  Laterality: Left;   PARTIAL HYSTERECTOMY     TOTAL KNEE ARTHROPLASTY Left 08/26/2018   Procedure: TOTAL KNEE ARTHROPLASTY;  Surgeon: Veverly Fells,  Richardson Landry, MD;  Location: [facility];  Service: Orthopedics;  Laterality: Left;   VIDEO ASSISTED THORACOSCOPY (VATS)/WEDGE RESECTION Right 02/12/2014   Procedure: RIGHT VIDEO ASSISTED THORACOSCOPY RIGHT LOWER LOBE LUNG /WEDGE RESECTION,RIGHT THORACOTOMY WITH RIGHT LOWER LOBE LOBECTOMY & NODE DISSECTION;  Surgeon: Melrose Nakayama, MD;  Location: [facility];  Service: Thoracic;  Laterality: Right;   VIDEO ASSISTED THORACOSCOPY (VATS)/WEDGE RESECTION Right 02/12/2014   VIDEO BRONCHOSCOPY N/A 02/12/2014   Procedure: VIDEO BRONCHOSCOPY;  Surgeon: Melrose Nakayama, MD;  Location: [facility];  Service: Thoracic;  Laterality: N/A;   Patient Active Problem List   Diagnosis Date Noted   Acute ischemic left MCA stroke ([place]) 01/28/2022   Aphasia as late effect of cerebrovascular accident (CVA) 01/28/2022   Right hemiparesis ([place]) 01/28/2022   Abnormal  gait due to muscle weakness 01/28/2022   Insomnia 10/13/2021   Frequency of urination 10/13/2021   Vitamin D deficiency 10/13/2021   Chronic renal failure 10/13/2021   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[place]) 09/24/2021   Aphasia due to acute cerebrovascular accident (CVA) ([place]) 09/21/2021   Chronic kidney disease, stage 3b ([place]) 09/21/2021   Depression with anxiety 09/21/2021   Hypertension 09/21/2021   Asthma, chronic 09/21/2021   Fall at home, initial encounter 09/21/2021   Right ankle pain 09/21/2021   Dysuria 08/03/2019   Chronic cough 12/20/2018   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   Status post total knee replacement, left 08/26/2018   Essential hypertension 02/23/2017   Adenocarcinoma of lung, stage 1 ([place]) 05/08/2014   S/P lobectomy of lung 02/12/2014   Asthma 01/30/2014    Multiple pulmonary nodules, largest R mid lung 06/29/2013   Cough variant asthma with component of uacs  05/31/2013    ONSET DATE: 01/28/2022   REFERRING DIAG: G81.91 (ICD-10-CM) - Right hemiparesis (HCC) I63.512 (ICD-10-CM) - Acute ischemic left MCA stroke (HCC)   THERAPY DIAG:  Hemiplegia and hemiparesis following cerebral infarction affecting right dominant side (HCC)  Muscle weakness (generalized)  History of falling  Rationale for Evaluation and Treatment Rehabilitation  SUBJECTIVE:                                                                                                                                                                                             SUBJECTIVE STATEMENT: Pt reports she fell last week, was bent forward to close her dog's kennel and lost her balance. Landed on her buttocks and has been very sore. Has not been doing HEP due to soreness. Did not go to doctor and cannot remember how she got up.    Pt accompanied by: self  PERTINENT HISTORY: hx of hypertension, thrombotic stroke involving left middle cerebral artery, asthma, lung  cancer, coronary calcification seen on CT and CKD   PAIN:  Are you having pain? No but having significant soreness in her sacral area following fall last week.  PRECAUTIONS: Fall  PATIENT GOALS "to be able to walk without help and without pain"  OBJECTIVE:   TODAY'S TREATMENT: Ther Act: STG assessment   [facility] PT Assessment - 03/11/22 1026       Transfers   Five time sit to stand comments  15.5s without UE support      Ambulation/Gait   Gait velocity 32.8' over 15.09s = 2.69f/s without AD      Berg Balance Test   Sit to Stand Able to stand without using hands and stabilize independently    Standing Unsupported Able to stand safely 2 minutes    Sitting with Back Unsupported but Feet Supported on Floor or Stool Able to sit safely and securely 2 minutes    Stand to Sit Sits safely with minimal use of hands    Transfers Able to transfer safely, minor use of hands    Standing Unsupported with Eyes Closed Able to stand 10 seconds with supervision    Standing Unsupported with Feet Together Needs help to attain position but able to stand for 30 seconds with feet together   60s   From Standing, Reach Forward with Outstretched Arm Reaches forward but needs supervision    From Standing Position, Pick up Object from Floor Able to pick up shoe safely and easily    From Standing Position, Turn to Look Behind Over each Shoulder Looks behind from both sides and weight shifts well    Turn 360 Degrees Able to turn 360 degrees safely but slowly   6.15s to L   Standing Unsupported, Alternately Place Feet on Step/Stool Needs assistance to keep from falling or unable to try    Standing Unsupported, One Foot in Front Needs help to step but can hold 15 seconds    Standing on One Leg Tries to lift leg/unable to hold 3 seconds but remains standing independently    Total Score 37    Berg comment: Significant fall risk            NMR  In // bars for improved BLE coordination, single leg stability  and BLE strength: -Fwd 4" hurdle navigation w/BUE support and progressing to single UE support, x5 each direction alternating leading leg each time. Pt w/increased difficulty leading w/LLE compared to RLE and frequently kicked hurdle w/L foot  -Lateral 4" hurdle navigation w/BUE support, x2 each direction. Pt able to perform to L side but unable to remember to come back on R side, requiring max verbal cues as pt attempted to come back in fwd direction rather than laterally.  -Deadlift to 6" box w/5# KB for anterior reaching out of BOS and posterior chain strength, x10 reps. Min cues for wider BOS and educated pt on importance of foot placement for stability. Pt verbalized understanding    PATIENT EDUCATION: Education details: continue HEP, importance of foot placement for stability  Person educated: Patient Education method: Explanation, Demonstration, and Handouts Education comprehension: verbalized understanding   HOME EXERCISE PROGRAM: Access Code: [email or web address]: [email or web address] Date: 02/10/2022 Prepared by: TExcell Seltzer Exercises - Side Stepping with Resistance at Ankles and Counter Support  - 1 x daily - 7 x weekly - 3 sets - 10 reps - Forward Step Up with Counter Support  -  1 x daily - 7 x weekly - 3 sets - 10 reps - Towel Scrunches  - 1 x daily - 7 x weekly - 2 sets - 10 reps - Seated Ankle Alphabet  - 1 x daily - 7 x weekly - 1 sets - 10 reps - Mini Squat with Counter Support  - 1 x daily - 7 x weekly - 3 sets - 10 reps   GOALS: Goals reviewed with patient? Yes  SHORT TERM GOALS: Target date: 03/03/2022  Initiate HEP Baseline: initiated 8/8 Goal status: MET  2.  Pt will improve gait velocity to at least 2.5 ft/sec for improved gait efficiency and performance at [place] level with LRAD Baseline: 2.26 ft/sec S* with RW (8/1), 2.35 ft/sec (8/24) Goal status: IN PROGRESS  3.  Pt will improve normal TUG to less than or equal to 13 seconds for  improved functional mobility and decreased fall risk. Baseline: 14 sec with RW (8/1), 11.91 sec with no AD (8/24) Goal status: MET  4.  Pt will improve Berg score to 40/56  for decreased fall risk Baseline: 34/56 (8/8); 37/56 on 9/6 Goal status: NOT MET  5.  Pt will improve 5 x STS to less than or equal to 12 seconds to demonstrate improved functional strength and transfer efficiency.  Baseline: 13.8 (8/1), 15.28 sec (8/24) Goal status: IN PROGRESS   LONG TERM GOALS: Target date: 03/31/2022  Pt will be independent with balance and strengthening HEP for improved strength, balance, transfers and gait. Baseline:  Goal status: INITIAL  2.  Pt will improve gait velocity to at least 3.0 ft/sec for improved gait efficiency and performance at mod I level with LRAD Baseline: 2.26 ft/sec S* with RW (8/1), 2.35 ft/sec with RW (8/24) Goal status: REVISED  3.  Pt will improve normal TUG to less than or equal to 12 seconds for improved functional mobility and decreased fall risk. Baseline: 14 sec with RW (8/1), 11.91 sec with no AD (8/24) Goal status: INITIAL  4.  Pt will improve Berg score to 46/56 for decreased fall risk Baseline: 34/56 (8/8) Goal status: INITIAL  5.  Pt will improve 5 x STS to less than or equal to 10 seconds to demonstrate improved functional strength and transfer efficiency.  Baseline: 13.8 sec (8/1), 15.28 sec (8/24) Goal status: INITIAL   ASSESSMENT:  CLINICAL IMPRESSION: Emphasis of skilled PT session on working on completing STG assessment, single leg stability and reaching out of BOS. Pt did not meet her Merrilee Jansky goal but did improve her baseline score by 3 points, placing her at a significant fall risk. Pt demonstrated the greatest improvement with single leg stability and transfers. Pt requires mod verbal cues for proper body mechanics and foot placement, as pt tends to demonstrate narrow BOS and inattention to R side. Continue POC.    OBJECTIVE IMPAIRMENTS  Abnormal gait, decreased balance, decreased coordination, decreased mobility, decreased strength, decreased safety awareness, and pain.   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   REHAB POTENTIAL: Good  CLINICAL DECISION MAKING: Stable/uncomplicated  EVALUATION COMPLEXITY: Moderate  PLAN: PT FREQUENCY: 2x/week  PT DURATION: 8 weeks  PLANNED INTERVENTIONS: Therapeutic exercises, Therapeutic activity, Neuromuscular re-education, Balance  training, Gait training, Patient/Family education, Self Care, Joint mobilization, Stair training, DME instructions, Electrical stimulation, Cryotherapy, Moist heat, Taping, Manual therapy, and Re-evaluation  PLAN FOR NEXT SESSION:  ongoing gait with no AD, add to HEP for balance and strengthening, obstacle course, LE coordination   Dalilah Curlin E Necha Harries, PT, DPT 03/11/2022, 10:59 AM

## 2022-03-13 ENCOUNTER — Ambulatory Visit: Payer: Medicare Other | Admitting: Physical Therapy

## 2022-03-13 ENCOUNTER — Ambulatory Visit: Payer: Medicare Other | Admitting: Speech Pathology

## 2022-03-13 DIAGNOSIS — I69351 Hemiplegia and hemiparesis following cerebral infarction affecting right dominant side: Secondary | ICD-10-CM | POA: Diagnosis not present

## 2022-03-13 DIAGNOSIS — M6281 Muscle weakness (generalized): Secondary | ICD-10-CM | POA: Diagnosis not present

## 2022-03-13 DIAGNOSIS — R2689 Other abnormalities of gait and mobility: Secondary | ICD-10-CM | POA: Diagnosis not present

## 2022-03-13 DIAGNOSIS — R41841 Cognitive communication deficit: Secondary | ICD-10-CM

## 2022-03-13 DIAGNOSIS — R482 Apraxia: Secondary | ICD-10-CM | POA: Diagnosis not present

## 2022-03-13 DIAGNOSIS — R4701 Aphasia: Secondary | ICD-10-CM

## 2022-03-13 DIAGNOSIS — Z9181 History of falling: Secondary | ICD-10-CM | POA: Diagnosis not present

## 2022-03-13 DIAGNOSIS — R278 Other lack of coordination: Secondary | ICD-10-CM | POA: Diagnosis not present

## 2022-03-13 NOTE — Therapy (Signed)
OUTPATIENT PHYSICAL THERAPY NEURO THERAPY   Patient Name: [name] MRN: [id number] DOB:[birth year], 81 y.o., female Today's Date: 03/13/2022   PCP: Pt unable to provide information and unable to locate in chart. REFERRING PROVIDER: Courtney Heys, MD    PT End of Session - 03/13/22 1017     Visit Number 9    Number of Visits 17    Date for PT Re-Evaluation 03/31/22    Authorization Type [employer]    Progress Note Due on Visit 10    PT Start Time 1016    PT Stop Time 1100    PT Time Calculation (min) 44 min    Equipment Utilized During Treatment Gait belt    Activity Tolerance Patient tolerated treatment well    Behavior During Therapy WFL for tasks assessed/performed                Past Medical History:  Diagnosis Date    Multiple pulmonary nodules, largest R mid lung 06/29/2013   Followed in Pulmonary clinic/ [facility]/ [facility] - see CXR 06/27/13  - CT chest 07/14/2013 > 1. No acute findings in the thorax to account for the patient's  symptoms.  2. However, there is a subsolid nodule in the   right lower lobe that has a ground-glass attenuation component  measuring 2.5 x 1.8 cm, and a small central solid component  measuring 4 mm. Initial follow-up by chest CT without    Adenocarcinoma of lung, stage 1 ([place])    Status post right lower lobectomy August 2015   Anxiety    Asthma    Atrophic vaginitis    Chronic cough 12/20/2018   Chronic iritis of left eye    Pupil stays dilated; nonreactive   Chronic kidney disease, stage 3b (HCC)    Colon polyp    Contact lens/glasses fitting    wears contacts or glasses   Cough variant asthma with component of uacs  05/31/2013   Followed in Pulmonary clinic/ [facility]/ [facility] in her 51's - Spirometry 02/23/13 wnl  - 07/03/2013 Sinus CT > Mild chronic sinus disease - No acute findings. Left-to-right nasal septal deviation of 3 mm. -med calendar 08/08/13 , redone 06/01/2016 and 02/22/2017  - Eos 4%   10/13/2013  >  rec singulair daily  - FENO 05/12/2016  =   24 on singulair  - Allergy profile 05/12/2016 >   IgE  47 pos   Depression    Depression with anxiety    Dysuria 08/03/2019   Environmental allergies    Essential hypertension 02/23/2017   Changed losartan to avapro 02/22/2017 due to cough    GERD (gastroesophageal reflux disease)    Hypertension    Hypertriglyceridemia    Kidney stones    OA (osteoarthritis)    Osteopenia    PONV (postoperative nausea and vomiting)    S/P lobectomy of lung 02/12/2014   Status post total knee replacement, left 08/26/2018   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   Past Surgical History:  Procedure Laterality Date   APPENDECTOMY     COLONOSCOPY     EYE SURGERY Left    cataract removal   ORIF WRIST FRACTURE Left 08/04/2013   Procedure: OPEN REDUCTION INTERNAL FIXATION (ORIF) LEFT DISTAL RADIUS WRIST FRACTURE;  Surgeon: Wynonia Sours, MD;  Location: [facility];  Service: Orthopedics;  Laterality: Left;   PARTIAL HYSTERECTOMY     TOTAL KNEE ARTHROPLASTY Left 08/26/2018   Procedure: TOTAL KNEE ARTHROPLASTY;  Surgeon:  Netta Cedars, MD;  Location: [facility];  Service: Orthopedics;  Laterality: Left;   VIDEO ASSISTED THORACOSCOPY (VATS)/WEDGE RESECTION Right 02/12/2014   Procedure: RIGHT VIDEO ASSISTED THORACOSCOPY RIGHT LOWER LOBE LUNG /WEDGE RESECTION,RIGHT THORACOTOMY WITH RIGHT LOWER LOBE LOBECTOMY & NODE DISSECTION;  Surgeon: Melrose Nakayama, MD;  Location: [place];  Service: Thoracic;  Laterality: Right;   VIDEO ASSISTED THORACOSCOPY (VATS)/WEDGE RESECTION Right 02/12/2014   VIDEO BRONCHOSCOPY N/A 02/12/2014   Procedure: VIDEO BRONCHOSCOPY;  Surgeon: Melrose Nakayama, MD;  Location: [facility];  Service: Thoracic;  Laterality: N/A;   Patient Active Problem List   Diagnosis Date Noted   Acute ischemic left MCA stroke ([place]) 01/28/2022   Aphasia as late effect of cerebrovascular accident (CVA) 01/28/2022   Right hemiparesis ([place]) 01/28/2022   Abnormal  gait due to muscle weakness 01/28/2022   Insomnia 10/13/2021   Frequency of urination 10/13/2021   Vitamin D deficiency 10/13/2021   Chronic renal failure 10/13/2021   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[place]) 09/24/2021   Aphasia due to acute cerebrovascular accident (CVA) ([place]) 09/21/2021   Chronic kidney disease, stage 3b ([place]) 09/21/2021   Depression with anxiety 09/21/2021   Hypertension 09/21/2021   Asthma, chronic 09/21/2021   Fall at home, initial encounter 09/21/2021   Right ankle pain 09/21/2021   Dysuria 08/03/2019   Chronic cough 12/20/2018   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   Status post total knee replacement, left 08/26/2018   Essential hypertension 02/23/2017   Adenocarcinoma of lung, stage 1 ([place]) 05/08/2014   S/P lobectomy of lung 02/12/2014   Asthma 01/30/2014    Multiple pulmonary nodules, largest R mid lung 06/29/2013   Cough variant asthma with component of uacs  05/31/2013    ONSET DATE: 01/28/2022   REFERRING DIAG: G81.91 (ICD-10-CM) - Right hemiparesis (HCC) I63.512 (ICD-10-CM) - Acute ischemic left MCA stroke (HCC)   THERAPY DIAG:  Muscle weakness (generalized)  History of falling  Other abnormalities of gait and mobility  Hemiplegia and hemiparesis following cerebral infarction affecting right dominant side (HCC)  Rationale for Evaluation and Treatment Rehabilitation  SUBJECTIVE:                                                                                                                                                                                             SUBJECTIVE STATEMENT: Pt reports her sacral pain is 3-4/10, is improving since her fall last week. Pt reports she has not been able to do her HEP, doesn't feel like she has the energy. Pt not wearing R ankle brace today, trying without it today for the first time. Pt  reports her R ankle has been feeling better.  Pt accompanied by: self   PERTINENT HISTORY:  hx of hypertension, thrombotic stroke involving left middle cerebral artery, asthma, lung cancer, coronary calcification seen on CT and CKD   PAIN:  Are you having pain? No but having significant soreness in her sacral area following fall last week (improved this session vs previous session).  PRECAUTIONS: Fall  PATIENT GOALS "to be able to walk without help and without pain"  OBJECTIVE:   TODAY'S TREATMENT: Ther Act: At countertop with one UE support and min A for balance:  -tandem gait 6 x 10 ft, difficulty achieving full tandem position, minor LOB -backwards gait 4 x 10 ft, initially with narrow BOS and decreased step length, improves with progression of task  Demonstrated fall recovery on mat on floor, pt attempts to go from sitting EOM to kneeling on floor, unable to perform safely. Pt able to go from sitting on mat table down to 8" stool on floor, some pain/difficulty due her knees though. Pt could utilize this method of getting to the floor next session to practice fall recovery.  NMR:  Ambulation through obstacle course with no AD and min A for balance:  -alt L/R gumdrop taps, foam beam step-overs -most difficulty performing gum drop taps with gait, several LOB requiring min A to recover and/or pt steadying herself by reaching out for mat table  -alt L/R gumdrop taps, gait across uneven mat on floor -one LOB when LE cross over when finished with obstacle course, requires mod A to recover to prevent fall   GAIT: Gait pattern: decreased hip/knee flexion- Right, ataxic, and wide BOS Distance walked: 115 ft Assistive device utilized: None Level of assistance: CGA Comments: Trial gait around clinic with no AD, mild gait deviations noted    PATIENT EDUCATION: Education details: continue HEP, importance of foot placement for stability  Person educated: Patient Education method: Explanation, Demonstration, and Handouts Education comprehension: verbalized  understanding   HOME EXERCISE PROGRAM: Access Code: [email or web address] URL: [email or web address] Date: 02/10/2022 Prepared by: Excell Seltzer  Exercises - Side Stepping with Resistance at Ankles and Counter Support  - 1 x daily - 7 x weekly - 3 sets - 10 reps - Forward Step Up with Counter Support  - 1 x daily - 7 x weekly - 3 sets - 10 reps - Towel Scrunches  - 1 x daily - 7 x weekly - 2 sets - 10 reps - Seated Ankle Alphabet  - 1 x daily - 7 x weekly - 1 sets - 10 reps - Mini Squat with Counter Support  - 1 x daily - 7 x weekly - 3 sets - 10 reps   GOALS: Goals reviewed with patient? Yes  SHORT TERM GOALS: Target date: 03/03/2022  Initiate HEP Baseline: initiated 8/8 Goal status: MET  2.  Pt will improve gait velocity to at least 2.5 ft/sec for improved gait efficiency and performance at Supervision level with LRAD Baseline: 2.26 ft/sec S* with RW (8/1), 2.35 ft/sec (8/24) Goal status: IN PROGRESS  3.  Pt will improve normal TUG to less than or equal to 13 seconds for improved functional mobility and decreased fall risk. Baseline: 14 sec with RW (8/1), 11.91 sec with no AD (8/24) Goal status: MET  4.  Pt will improve Berg score to 40/56  for decreased fall risk Baseline: 34/56 (8/8); 37/56 on 9/6 Goal status: NOT MET  5.  Pt will improve 5 x STS to less than or  equal to 12 seconds to demonstrate improved functional strength and transfer efficiency.  Baseline: 13.8 (8/1), 15.28 sec (8/24) Goal status: IN PROGRESS   LONG TERM GOALS: Target date: 03/31/2022  Pt will be independent with balance and strengthening HEP for improved strength, balance, transfers and gait. Baseline:  Goal status: INITIAL  2.  Pt will improve gait velocity to at least 3.0 ft/sec for improved gait efficiency and performance at mod I level with LRAD Baseline: 2.26 ft/sec S* with RW (8/1), 2.35 ft/sec with RW (8/24) Goal status: REVISED  3.  Pt will improve normal TUG to less than or equal  to 12 seconds for improved functional mobility and decreased fall risk. Baseline: 14 sec with RW (8/1), 11.91 sec with no AD (8/24) Goal status: INITIAL  4.  Pt will improve Berg score to 46/56 for decreased fall risk Baseline: 34/56 (8/8) Goal status: INITIAL  5.  Pt will improve 5 x STS to less than or equal to 10 seconds to demonstrate improved functional strength and transfer efficiency.  Baseline: 13.8 sec (8/1), 15.28 sec (8/24) Goal status: INITIAL   ASSESSMENT:  CLINICAL IMPRESSION: Emphasis of skilled PT session on working on dynamic balance challenges with no AD and fall recovery techniques. Pt continues to exhibit some difficulty maintaining standing balance without assist from AD during SLS tasks and continues to be a high fall risk due to her history of falls and several near falls during therapy session this date. Pt will continue to benefit from skilled therapy services to address ongoing gait and balance impairments as well as safety and independence with functional mobility. Continue POC.    OBJECTIVE IMPAIRMENTS Abnormal gait, decreased balance, decreased coordination, decreased mobility, decreased strength, decreased safety awareness, and pain.   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   REHAB POTENTIAL: Good  CLINICAL DECISION MAKING: Stable/uncomplicated  EVALUATION COMPLEXITY: Moderate  PLAN: PT FREQUENCY: 2x/week  PT DURATION: 8 weeks  PLANNED INTERVENTIONS: Therapeutic exercises, Therapeutic activity, Neuromuscular re-education,  Balance training, Gait training, Patient/Family education, Self Care, Joint mobilization, Stair training, DME instructions, Electrical stimulation, Cryotherapy, Moist heat, Taping, Manual therapy, and Re-evaluation  PLAN FOR NEXT SESSION:  ongoing gait with no AD, add to HEP for balance and strengthening, obstacle course, LE coordination, tandem gait, backwards gait, fall recovery (bump down to 8" stool then to mat on floor)   Excell Seltzer, PT, DPT, CSRS 03/13/2022, 11:20 AM

## 2022-03-13 NOTE — Therapy (Signed)
OUTPATIENT SPEECH LANGUAGE PATHOLOGY TREATMENT   Patient Name: [name] MRN: [id number] DOB:1941-01-18, 81 y.o., female Today's Date: 03/13/2022  PCP: none on file REFERRING PROVIDER: Courtney Heys, MD   End of Session - 03/13/22 1047     Visit Number 7    Number of Visits 17    Date for SLP Re-Evaluation 03/31/22    Progress Note Due on Visit 10    SLP Start Time 1100    SLP Stop Time  1145    SLP Time Calculation (min) 45 min    Activity Tolerance Patient tolerated treatment well                  Past Medical History:  Diagnosis Date    Multiple pulmonary nodules, largest R mid lung 06/29/2013   Followed in Pulmonary clinic/ [facility]/ [facility] - see CXR 06/27/13  - CT chest 07/14/2013 > 1. No acute findings in the thorax to account for the patient's  symptoms.  2. However, there is a subsolid nodule in the   right lower lobe that has a ground-glass attenuation component  measuring 2.5 x 1.8 cm, and a small central solid component  measuring 4 mm. Initial follow-up by chest CT without    Adenocarcinoma of lung, stage 1 ([place])    Status post right lower lobectomy August 2015   Anxiety    Asthma    Atrophic vaginitis    Chronic cough 12/20/2018   Chronic iritis of left eye    Pupil stays dilated; nonreactive   Chronic kidney disease, stage 3b (HCC)    Colon polyp    Contact lens/glasses fitting    wears contacts or glasses   Cough variant asthma with component of uacs  05/31/2013   Followed in Pulmonary clinic/ [facility]/ [facility] in her 5's - Spirometry 02/23/13 wnl  - 07/03/2013 Sinus CT > Mild chronic sinus disease - No acute findings. Left-to-right nasal septal deviation of 3 mm. -med calendar 08/08/13 , redone 06/01/2016 and 02/22/2017  - Eos 4%   10/13/2013  > rec singulair daily  - FENO 05/12/2016  =   24 on singulair  - Allergy profile 05/12/2016 >   IgE  47 pos   Depression    Depression with anxiety    Dysuria 08/03/2019   Environmental  allergies    Essential hypertension 02/23/2017   Changed losartan to avapro 02/22/2017 due to cough    GERD (gastroesophageal reflux disease)    Hypertension    Hypertriglyceridemia    Kidney stones    OA (osteoarthritis)    Osteopenia    PONV (postoperative nausea and vomiting)    S/P lobectomy of lung 02/12/2014   Status post total knee replacement, left 08/26/2018   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   Past Surgical History:  Procedure Laterality Date   APPENDECTOMY     COLONOSCOPY     EYE SURGERY Left    cataract removal   ORIF WRIST FRACTURE Left 08/04/2013   Procedure: OPEN REDUCTION INTERNAL FIXATION (ORIF) LEFT DISTAL RADIUS WRIST FRACTURE;  Surgeon: Wynonia Sours, MD;  Location: [facility];  Service: Orthopedics;  Laterality: Left;   PARTIAL HYSTERECTOMY     TOTAL KNEE ARTHROPLASTY Left 08/26/2018   Procedure: TOTAL KNEE ARTHROPLASTY;  Surgeon: Netta Cedars, MD;  [facility];  Service: Orthopedics;  Laterality: Left;   VIDEO ASSISTED THORACOSCOPY (VATS)/WEDGE RESECTION Right 02/12/2014   Procedure: RIGHT VIDEO ASSISTED THORACOSCOPY RIGHT LOWER LOBE LUNG [place]  RESECTION,RIGHT THORACOTOMY WITH RIGHT LOWER LOBE LOBECTOMY & NODE DISSECTION;  Surgeon: Melrose Nakayama, MD;  Location: [facility];  Service: Thoracic;  Laterality: Right;   VIDEO ASSISTED THORACOSCOPY (VATS)/WEDGE RESECTION Right 02/12/2014   VIDEO BRONCHOSCOPY N/A 02/12/2014   Procedure: VIDEO BRONCHOSCOPY;  Surgeon: Melrose Nakayama, MD;  Location: [facility];  Service: Thoracic;  Laterality: N/A;   Patient Active Problem List   Diagnosis Date Noted   Acute ischemic left MCA stroke ([place]) 01/28/2022   Aphasia as late effect of cerebrovascular accident (CVA) 01/28/2022   Right hemiparesis ([place]) 01/28/2022   Abnormal gait due to muscle weakness 01/28/2022   Insomnia 10/13/2021   Frequency of urination 10/13/2021   Vitamin D deficiency 10/13/2021   Chronic renal failure 10/13/2021   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[place]) 09/24/2021   Aphasia due to acute cerebrovascular accident (CVA) ([place]) 09/21/2021   Chronic kidney disease, stage 3b ([place]) 09/21/2021   Depression with anxiety 09/21/2021   Hypertension 09/21/2021   Asthma, chronic 09/21/2021   Fall at home, initial encounter 09/21/2021   Right ankle pain 09/21/2021   Dysuria 08/03/2019   Chronic cough 12/20/2018   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   Status post total knee replacement, left 08/26/2018   Essential hypertension 02/23/2017   Adenocarcinoma of lung, stage 1 ([place]) 05/08/2014   S/P lobectomy of lung 02/12/2014   Asthma 01/30/2014    Multiple pulmonary nodules, largest R mid lung 06/29/2013   Cough variant asthma with component of uacs  05/31/2013    ONSET DATE: March 2023, referral July 2023   REFERRING DIAG:  I69.320 (ICD-10-CM) - Aphasia as late effect of cerebrovascular accident (CVA)  I63.512 (ICD-10-CM) - Acute ischemic left MCA stroke ([place])    THERAPY DIAG:  Aphasia  Apraxia  Cognitive communication deficit  Rationale for Evaluation and Treatment Rehabilitation  SUBJECTIVE:   SUBJECTIVE STATEMENT: "I thought I was going to the other kind of therapy"   PAIN:  Are you having pain? No   OBJECTIVE:  TODAY'S TREATMENT:  03-13-22: Target identification of dysnomia and word finding strategy use in discourse level, structured task using Novel Approach to Real-life Communication (NARNIA) framework. Word generation and sentence production were primed using action pictures. Pt was able to produce 11 nouns and 11 verbs, creating simple sentence structures on 8 trials and complex, expanded sentences on 3 trials given usual mod-A. Self-ID paraphasias x2, overt anomia x4 requiring SLP semantic and phonemic cueing to repair. Pt selected preferred topic, resulting in procedural type discourse. Using visual framework for organization, pt provided x7 details with usual max-A. Using  produced visual aid, pt produced planned discourse notable for reduced overall organization and connectedness impacting successful procedural discourse sample. Agrammatical, omission of tools and steps. Given rating scale, pt scored their performance as 7/8, vs SLP rating of 3/8 indicating reduced awareness of discourse self-perception. Will implement recording with pt's consent next trial to facilitate improved self-evaluation.  03-11-22: Target word finding through variety of tasks, increasing in complexity. Given x4 items in category, pt able to ID correct category in 8/10 trials with rare repetition. Given a category, pt able to generate x5, x10, x10 across respective trials with usual semantic and phonemic cueing for initial trial faded to supervision-A for subsequent trials. Pt demonstrating self-correction of paraphasias throughout task in all opportunities, reports this has been improving at home. Demonstrates self-cueing when anomia occurs x3 during naming task. No instances of uncorrected paraphasias this date. Pt able to copy  x10 items from SLP model accurately with rare self-correction required. Pt says "wow, that's much better!" Re: her writing. Notable improvement in alternating attention and accuracy of written items this date vs prior writing attempts. Pt accurately writes x6 family member names, initial attempt. Given letters of target names in randomized order, pt able to organize into correct order and then copy for x4 names which unable to spell initially.    PATIENT EDUCATION: Education details: see above Person educated: Patient Education method: Customer service manager Education comprehension: verbalized understanding, returned demonstration, and needs further education   GOALS: Goals reviewed with patient? Yes  SHORT TERM GOALS: Target date: 03/03/22  Pt will verbalize 7 items in personally relevant categories with occasional min-A over 2 therapy sessions Baseline:  02-24-22; 02/26/22 Goal status: MET  2.  Pt will correct semantic paraphasia given usual mod-A in 70% of opportunities during structured task over 2 sessions.  Baseline: 02-24-22; 02-26-22 Goal status: MET  3.  Pt will write x5 item list with 80% accuracy, given usual mod-A over 2 sessions.  Baseline: 02-24-22; 02-26-22 Goal status: NOT MET  4.  Pt will successfully use trained anomia compensation in x3 opportunities across 2 therapy sessions with occasioanl min-A.  Baseline: 02-24-22; 02-26-22 Goal status: PARTIALLY MET   LONG TERM GOALS: Target date: 03/31/2022  Pt will identify, and correct, 50% of semantic paraphasias during structured task with occasional min-A over 2 sessions.  Baseline:  Goal status: IN PROGRESS  2.  Pt will report improvement of 2 points via CPIB PROM by d/c Baseline: 15 Goal status: IN PROGRESS  3.  Pt will carryover dysnomia compensations to conversational speech, resulting in strategy use in 70% of opportunities over 15 minute conversation Baseline:  Goal status: IN PROGRESS  4.  Pt will write x10 phrases, with mod-I, with 80% accuracy over 2 sessions.   Baseline:  Goal status: IN PROGRESS  ASSESSMENT:  CLINICAL IMPRESSION: Patient is a 81 y.o. F presenting with mild-moderate expressive aphasia. Receptive language skills appear to be within gross normal limits. Spontaneous speech is characterized by usual vague language, anomia and frequent paraphasias impacting clarity and cohesiveness of message. Emerging awareness of paraphasias exhibited. Written expression impaired, with SLP initiating skilled interventions and strategies to aid in abilites for desired functional tasks at home such as grocery list creation. SLP has initiated structured language tasks to aid in improved expressive language and enhanced communication confidence and efficacy. I recommend continued skilled ST to address expressive aphasia.  OBJECTIVE IMPAIRMENTS include expressive language,  aphasia, and apraxia. These impairments are limiting patient from effectively communicating at home and in community. Factors affecting potential to achieve goals and functional outcome are family/community support. Patient will benefit from skilled SLP services to address above impairments and improve overall function.  REHAB POTENTIAL: Good  PLAN: SLP FREQUENCY: 2x/week  SLP DURATION: 8 weeks  PLANNED INTERVENTIONS: Language facilitation, Cueing hierachy, Internal/external aids, Functional tasks, Multimodal communication approach, SLP instruction and feedback, Compensatory strategies, and Patient/family education    Su Monks, CCC-SLP 03/13/2022, 10:49 AM

## 2022-03-15 NOTE — Therapy (Signed)
OUTPATIENT OCCUPATIONAL THERAPY NEURO TREATMENT  Patient Name: [name] MRN: [id number] DOB:02/01/1941, 81 y.o., female Today's Date: 03/16/2022  PCP: NA REFERRING PROVIDER: Courtney Heys, MD    OT End of Session - 03/16/22 1227     Visit Number 4    Number of Visits 9    Authorization Type U[employer] Medicare    Authorization - Number of Visits 4    Progress Note Due on Visit 10    OT Start Time 1233    OT Stop Time 1315    OT Time Calculation (min) 42 min    Activity Tolerance Patient tolerated treatment well    Behavior During Therapy WFL for tasks assessed/performed               Past Medical History:  Diagnosis Date    Multiple pulmonary nodules, largest R mid lung 06/29/2013   Followed in Pulmonary clinic/ [facility]/ [facility] - see CXR 06/27/13  - CT chest 07/14/2013 > 1. No acute findings in the thorax to account for the patient's  symptoms.  2. However, there is a subsolid nodule in the   right lower lobe that has a ground-glass attenuation component  measuring 2.5 x 1.8 cm, and a small central solid component  measuring 4 mm. Initial follow-up by chest CT without    Adenocarcinoma of lung, stage 1 ([place])    Status post right lower lobectomy August 2015   Anxiety    Asthma    Atrophic vaginitis    Chronic cough 12/20/2018   Chronic iritis of left eye    Pupil stays dilated; nonreactive   Chronic kidney disease, stage 3b (HCC)    Colon polyp    Contact lens/glasses fitting    wears contacts or glasses   Cough variant asthma with component of uacs  05/31/2013   Followed in Pulmonary clinic/ [facility]/ [facility] in her 79's - Spirometry 02/23/13 wnl  - 07/03/2013 Sinus CT > Mild chronic sinus disease - No acute findings. Left-to-right nasal septal deviation of 3 mm. -med calendar 08/08/13 , redone 06/01/2016 and 02/22/2017  - Eos 4%   10/13/2013  > rec singulair daily  - FENO 05/12/2016  =   24 on singulair  - Allergy profile 05/12/2016 >   IgE  47 pos    Depression    Depression with anxiety    Dysuria 08/03/2019   Environmental allergies    Essential hypertension 02/23/2017   Changed losartan to avapro 02/22/2017 due to cough    GERD (gastroesophageal reflux disease)    Hypertension    Hypertriglyceridemia    Kidney stones    OA (osteoarthritis)    Osteopenia    PONV (postoperative nausea and vomiting)    S/P lobectomy of lung 02/12/2014   Status post total knee replacement, left 08/26/2018   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   Past Surgical History:  Procedure Laterality Date   APPENDECTOMY     COLONOSCOPY     EYE SURGERY Left    cataract removal   ORIF WRIST FRACTURE Left 08/04/2013   Procedure: OPEN REDUCTION INTERNAL FIXATION (ORIF) LEFT DISTAL RADIUS WRIST FRACTURE;  Surgeon: Wynonia Sours, MD;  Location: [place];  Service: Orthopedics;  Laterality: Left;   PARTIAL HYSTERECTOMY     TOTAL KNEE ARTHROPLASTY Left 08/26/2018   Procedure: TOTAL KNEE ARTHROPLASTY;  Surgeon: Netta Cedars, MD;  Location: [facility];  Service: Orthopedics;  Laterality: Left;   VIDEO ASSISTED THORACOSCOPY (VATS)/WEDGE RESECTION Right  02/12/2014   Procedure: RIGHT VIDEO ASSISTED THORACOSCOPY RIGHT LOWER LOBE LUNG /WEDGE RESECTION,RIGHT THORACOTOMY WITH RIGHT LOWER LOBE LOBECTOMY & NODE DISSECTION;  Surgeon: Melrose Nakayama, MD;  Location: [place];  Service: Thoracic;  Laterality: Right;   VIDEO ASSISTED THORACOSCOPY (VATS)/WEDGE RESECTION Right 02/12/2014   VIDEO BRONCHOSCOPY N/A 02/12/2014   Procedure: VIDEO BRONCHOSCOPY;  Surgeon: Melrose Nakayama, MD;  Location: [place];  Service: Thoracic;  Laterality: N/A;   Patient Active Problem List   Diagnosis Date Noted   Acute ischemic left MCA stroke ([place]) 01/28/2022   Aphasia as late effect of cerebrovascular accident (CVA) 01/28/2022   Right hemiparesis ([place]) 01/28/2022   Abnormal gait due to muscle weakness 01/28/2022   Insomnia 10/13/2021   Frequency of urination 10/13/2021    Vitamin D deficiency 10/13/2021   Chronic renal failure 10/13/2021   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[place]) 09/24/2021   Aphasia due to acute cerebrovascular accident (CVA) ([place]) 09/21/2021   Chronic kidney disease, stage 3b ([place]) 09/21/2021   Depression with anxiety 09/21/2021   Hypertension 09/21/2021   Asthma, chronic 09/21/2021   Fall at home, initial encounter 09/21/2021   Right ankle pain 09/21/2021   Dysuria 08/03/2019   Chronic cough 12/20/2018   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   Status post total knee replacement, left 08/26/2018   Essential hypertension 02/23/2017   Adenocarcinoma of lung, stage 1 ([place]) 05/08/2014   S/P lobectomy of lung 02/12/2014   Asthma 01/30/2014    Multiple pulmonary nodules, largest R mid lung 06/29/2013   Cough variant asthma with component of uacs  05/31/2013    ONSET DATE: 01/28/2022   REFERRING DIAG: G81.91 (ICD-10-CM) - Right hemiparesis (HCC) I63.512 (ICD-10-CM) - Acute ischemic left MCA stroke (HCC)   THERAPY DIAG:  Hemiplegia and hemiparesis following cerebral infarction affecting right dominant side (HCC)  Muscle weakness (generalized)  Rationale for Evaluation and Treatment Rehabilitation  SUBJECTIVE:   SUBJECTIVE STATEMENT: No pain from last fall now.   Aides continue to help with cooking (pt ate out a lot prior to CVA with neighbor--didn't cook much) and stands by while in shower and with brace, aide takes do out, not currently driving   Pt accompanied by: self  PERTINENT HISTORY:  hx of hypertension, thrombotic stroke involving left middle cerebral artery, asthma, lung cancer, coronary calcification seen on CT and CKD    PRECAUTIONS: Fall  WEIGHT BEARING RESTRICTIONS No  PAIN:  Are you having pain? Yes - right ankle - throbbing but better today - OT not directly addressing  FALLS: Has patient fallen in last 6 months? Yes [place] last week.  Fell backward, could not get up without  help  LIVING ENVIRONMENT: Lives with: lives alone with aide/caregiver Mon-Fri Lives in: House/apartment (one level home w/ 1 step to enter)   Has following equipment at home: Environmental consultant - 4 wheeled, Electronics engineer, and Grab bars  PLOF: Independent with basic ADLs  PATIENT GOALS I want to be more independent  OBJECTIVE:   Today's treatment:  Discussed activities that aide continues to provide assist with and discussed trying to perform some of these activities with supervision (see pt instructions)  Standing, functional reaching with each UE outside base of support and overhead for incr balance and activity tolerance with no LOB (with 1 UE support intermittently).  Functional ambulation for short distances without rollator with supervision/no LOB.  Standing functional reaching to bend down and set "food" and water bowls on floor in prep to feed/give dog  water with 1 UE support with good safety demonstrated x3 and no LOB.  Arm bike x 56min (forward/backward) level 2 for reciprocal movement/conditioning without rest.    PATIENT EDUCATION: Education details: UE yellow theraband HEP; Functional Home Activity Program to incr IADL participation (with supervision)--see pt instructions Person educated: Patient Education method: Explanation, Demonstration, Verbal cues, and Handouts Education comprehension: verbalized understanding and returned demonstration   HOME EXERCISE PROGRAM: 03/16/22:  UE yellow theraband HEP; Functional Home Activity Program to incr IADL participation (with supervision)--see pt instructions    GOALS: Goals reviewed with patient? Yes  SHORT TERM GOALS: Target date: 03/08/22  Patient will complete a home activity program designed to decrease level of care provided by paid caregivers Baseline: Goal status: IN PROGRESS   2.  Patient will transfer into / out of shower with distant supervision Baseline:  Goal status: INITIAL  3.  Patient will wash her back with AE  without physical assistance Baseline:  Goal status: IN PROGRESS     LONG TERM GOALS: Target date: 04/07/22  Patient will reduce at least 6 hours total of paid caregiver time Baseline:  Goal status: INITIAL  2.  Patient will demonstrate awareness of return to driving recommendations Baseline:  Goal status: IN PROGRESS  3.  Patient will dress herself with modified independence including right ankle brace Baseline:  Goal status: INITIAL  4.  Patient will demonstrate improved stand balance as evidenced by standing without UE support during functional activities.   Baseline:  Goal status: IN PROGRESS   ASSESSMENT:  CLINICAL IMPRESSION: Pt progressing slowly towards goals.  Pt very motivated to incr independence.  Pt would benefit from continued practice and focus on safety strategies for simulated IADLs.  PERFORMANCE DEFICITS in functional skills including coordination, dexterity, proprioception, sensation, tone, ROM, strength, flexibility, FMC, GMC, and UE functional use,   IMPAIRMENTS are limiting patient from ADLs and IADLs.   COMORBIDITIES may have co-morbidities  that affects occupational performance. Patient will benefit from skilled OT to address above impairments and improve overall function.  MODIFICATION OR ASSISTANCE TO COMPLETE EVALUATION: Min-Moderate modification of tasks or assist with assess necessary to complete an evaluation.  OT OCCUPATIONAL PROFILE AND HISTORY: Detailed assessment: Review of records and additional review of physical, cognitive, psychosocial history related to current functional performance.  CLINICAL DECISION MAKING: Moderate - several treatment options, min-mod task modification necessary  REHAB POTENTIAL: Good  EVALUATION COMPLEXITY: Moderate   PLAN: OT FREQUENCY: 1x/week  OT DURATION: 8 weeks  PLANNED INTERVENTIONS: self care/ADL training, therapeutic exercise, therapeutic activity, neuromuscular re-education, balance training,  functional mobility training, aquatic therapy, patient/family education, visual/perceptual remediation/compensation, and DME and/or AE instructions  RECOMMENDED OTHER SERVICES: NA  CONSULTED AND AGREED WITH PLAN OF CARE: Patient  PLAN FOR NEXT SESSION: practice IADLs, continue standing balance without UE support, review HEP   Emmett Bracknell, OTR/L 03/16/2022, 12:33 PM

## 2022-03-16 ENCOUNTER — Ambulatory Visit: Payer: Medicare Other | Admitting: Occupational Therapy

## 2022-03-16 ENCOUNTER — Encounter: Payer: Self-pay | Admitting: Occupational Therapy

## 2022-03-16 DIAGNOSIS — R278 Other lack of coordination: Secondary | ICD-10-CM | POA: Diagnosis not present

## 2022-03-16 DIAGNOSIS — M6281 Muscle weakness (generalized): Secondary | ICD-10-CM

## 2022-03-16 DIAGNOSIS — R2689 Other abnormalities of gait and mobility: Secondary | ICD-10-CM | POA: Diagnosis not present

## 2022-03-16 DIAGNOSIS — I69351 Hemiplegia and hemiparesis following cerebral infarction affecting right dominant side: Secondary | ICD-10-CM

## 2022-03-16 DIAGNOSIS — R4701 Aphasia: Secondary | ICD-10-CM | POA: Diagnosis not present

## 2022-03-16 DIAGNOSIS — R482 Apraxia: Secondary | ICD-10-CM | POA: Diagnosis not present

## 2022-03-16 DIAGNOSIS — Z9181 History of falling: Secondary | ICD-10-CM | POA: Diagnosis not present

## 2022-03-16 NOTE — Patient Instructions (Addendum)
    Daily, try to do the following with supervision (aide close by):  Load your weekly medication box.  Take your medication yourself (reminders only if needed)  Wipe the counter, straighten up  Give your dog food and water  Warm up items in microwave or make a snack       Strengthening: Resisted Flexion   Attach tube to door.  Hold tubing with one arm at side. Pull forward and up with elbow straight. Move shoulder through pain-free range of motion, no further than shoulder height. Repeat 10 times per set.  Do 1 sessions per day.    Strengthening: Resisted Extension   Attach one end to door.  Hold tubing in one hand, arm forward. Pull arm back, elbow straight. Repeat 10 times per set. Do 1 sessions per day.   Resisted Horizontal Abduction: Bilateral   Sit or stand, tubing in both hands, palms down and arms out in front. Keeping arms straight, pinch shoulder blades together and stretch arms out. Repeat 10 times per set.  Do 1 sessions per day.

## 2022-03-17 ENCOUNTER — Encounter (INDEPENDENT_AMBULATORY_CARE_PROVIDER_SITE_OTHER): Payer: Medicare Other | Admitting: Ophthalmology

## 2022-03-17 DIAGNOSIS — H35033 Hypertensive retinopathy, bilateral: Secondary | ICD-10-CM

## 2022-03-17 DIAGNOSIS — I10 Essential (primary) hypertension: Secondary | ICD-10-CM

## 2022-03-17 DIAGNOSIS — H353132 Nonexudative age-related macular degeneration, bilateral, intermediate dry stage: Secondary | ICD-10-CM

## 2022-03-17 DIAGNOSIS — H43813 Vitreous degeneration, bilateral: Secondary | ICD-10-CM | POA: Diagnosis not present

## 2022-03-17 DIAGNOSIS — H34831 Tributary (branch) retinal vein occlusion, right eye, with macular edema: Secondary | ICD-10-CM | POA: Diagnosis not present

## 2022-03-18 ENCOUNTER — Ambulatory Visit: Payer: Medicare Other | Admitting: Speech Pathology

## 2022-03-18 ENCOUNTER — Ambulatory Visit: Payer: Medicare Other | Admitting: Physical Therapy

## 2022-03-18 DIAGNOSIS — Z9181 History of falling: Secondary | ICD-10-CM | POA: Diagnosis not present

## 2022-03-18 DIAGNOSIS — R278 Other lack of coordination: Secondary | ICD-10-CM | POA: Diagnosis not present

## 2022-03-18 DIAGNOSIS — M6281 Muscle weakness (generalized): Secondary | ICD-10-CM

## 2022-03-18 DIAGNOSIS — R2689 Other abnormalities of gait and mobility: Secondary | ICD-10-CM | POA: Diagnosis not present

## 2022-03-18 DIAGNOSIS — R4701 Aphasia: Secondary | ICD-10-CM | POA: Diagnosis not present

## 2022-03-18 DIAGNOSIS — R482 Apraxia: Secondary | ICD-10-CM | POA: Diagnosis not present

## 2022-03-18 DIAGNOSIS — R41841 Cognitive communication deficit: Secondary | ICD-10-CM

## 2022-03-18 DIAGNOSIS — I69351 Hemiplegia and hemiparesis following cerebral infarction affecting right dominant side: Secondary | ICD-10-CM | POA: Diagnosis not present

## 2022-03-18 NOTE — Therapy (Signed)
OUTPATIENT PHYSICAL THERAPY NEURO THERAPY   Patient Name: [name] MRN: [id number] DOB:04-03-1941, 81 y.o., female Today's Date: 03/18/2022   PCP: Pt unable to provide information and unable to locate in chart. REFERRING PROVIDER: Courtney Heys, MD   Physical Therapy Progress Note   Dates of Reporting Period: 02/03/2022 - 03/18/2022  See Note below for Objective Data and Assessment of Progress/Goals.  Thank you for the referral of this patient. Excell Seltzer, PT, DPT, CSRS    PT End of Session - 03/18/22 1018     Visit Number 10    Number of Visits 17    Date for PT Re-Evaluation 03/31/22    Authorization Type UHC Medicare    Progress Note Due on Visit 10    PT Start Time 1015    PT Stop Time 1100    PT Time Calculation (min) 45 min    Equipment Utilized During Treatment Gait belt    Activity Tolerance Patient tolerated treatment well    Behavior During Therapy WFL for tasks assessed/performed                 Past Medical History:  Diagnosis Date    Multiple pulmonary nodules, largest R mid lung 06/29/2013   Followed in Pulmonary clinic/ [facility]/ [facility] - see CXR 06/27/13  - CT chest 07/14/2013 > 1. No acute findings in the thorax to account for the patient's  symptoms.  2. However, there is a subsolid nodule in the   right lower lobe that has a ground-glass attenuation component  measuring 2.5 x 1.8 cm, and a small central solid component  measuring 4 mm. Initial follow-up by chest CT without    Adenocarcinoma of lung, stage 1 ([place])    Status post right lower lobectomy August 2015   Anxiety    Asthma    Atrophic vaginitis    Chronic cough 12/20/2018   Chronic iritis of left eye    Pupil stays dilated; nonreactive   Chronic kidney disease, stage 3b (HCC)    Colon polyp    Contact lens/glasses fitting    wears contacts or glasses   Cough variant asthma with component of uacs  05/31/2013   Followed in Pulmonary clinic/ [facility]/ [facility]  Onset in her 28's - Spirometry 02/23/13 wnl  - 07/03/2013 Sinus CT > Mild chronic sinus disease - No acute findings. Left-to-right nasal septal deviation of 3 mm. -med calendar 08/08/13 , redone 06/01/2016 and 02/22/2017  - Eos 4%   10/13/2013  > rec singulair daily  - FENO 05/12/2016  =   24 on singulair  - Allergy profile 05/12/2016 >   IgE  47 pos   Depression    Depression with anxiety    Dysuria 08/03/2019   Environmental allergies    Essential hypertension 02/23/2017   Changed losartan to avapro 02/22/2017 due to cough    GERD (gastroesophageal reflux disease)    Hypertension    Hypertriglyceridemia    Kidney stones    OA (osteoarthritis)    Osteopenia    PONV (postoperative nausea and vomiting)    S/P lobectomy of lung 02/12/2014   Status post total knee replacement, left 08/26/2018   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   Past Surgical History:  Procedure Laterality Date   APPENDECTOMY     COLONOSCOPY     EYE SURGERY Left    cataract removal   ORIF WRIST FRACTURE Left 08/04/2013   Procedure: OPEN REDUCTION INTERNAL FIXATION (ORIF) LEFT DISTAL RADIUS  WRIST FRACTURE;  Surgeon: Wynonia Sours, MD;  Location: [place];  Service: Orthopedics;  Laterality: Left;   PARTIAL HYSTERECTOMY     TOTAL KNEE ARTHROPLASTY Left 08/26/2018   Procedure: TOTAL KNEE ARTHROPLASTY;  Surgeon: Netta Cedars, MD;  [facility];  Service: Orthopedics;  Laterality: Left;   VIDEO ASSISTED THORACOSCOPY (VATS)/WEDGE RESECTION Right 02/12/2014   Procedure: RIGHT VIDEO ASSISTED THORACOSCOPY RIGHT LOWER LOBE LUNG /WEDGE RESECTION,RIGHT THORACOTOMY WITH RIGHT LOWER LOBE LOBECTOMY & NODE DISSECTION;  Surgeon: Melrose Nakayama, MD;  Location: [place];  Service: Thoracic;  Laterality: Right;   VIDEO ASSISTED THORACOSCOPY (VATS)/WEDGE RESECTION Right 02/12/2014   VIDEO BRONCHOSCOPY N/A 02/12/2014   Procedure: VIDEO BRONCHOSCOPY;  Surgeon: Melrose Nakayama, MD;  Location: [place];  Service: Thoracic;   Laterality: N/A;   Patient Active Problem List   Diagnosis Date Noted   Acute ischemic left MCA stroke ([place]) 01/28/2022   Aphasia as late effect of cerebrovascular accident (CVA) 01/28/2022   Right hemiparesis ([place]) 01/28/2022   Abnormal gait due to muscle weakness 01/28/2022   Insomnia 10/13/2021   Frequency of urination 10/13/2021   Vitamin D deficiency 10/13/2021   Chronic renal failure 10/13/2021   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[place]) 09/24/2021   Aphasia due to acute cerebrovascular accident (CVA) ([place]) 09/21/2021   Chronic kidney disease, stage 3b ([place]) 09/21/2021   Depression with anxiety 09/21/2021   Hypertension 09/21/2021   Asthma, chronic 09/21/2021   Fall at home, initial encounter 09/21/2021   Right ankle pain 09/21/2021   Dysuria 08/03/2019   Chronic cough 12/20/2018   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   Status post total knee replacement, left 08/26/2018   Essential hypertension 02/23/2017   Adenocarcinoma of lung, stage 1 ([place]) 05/08/2014   S/P lobectomy of lung 02/12/2014   Asthma 01/30/2014    Multiple pulmonary nodules, largest R mid lung 06/29/2013   Cough variant asthma with component of uacs  05/31/2013    ONSET DATE: 01/28/2022   REFERRING DIAG: G81.91 (ICD-10-CM) - Right hemiparesis (HCC) I63.512 (ICD-10-CM) - Acute ischemic left MCA stroke (HCC)   THERAPY DIAG:  Hemiplegia and hemiparesis following cerebral infarction affecting right dominant side (HCC)  Muscle weakness (generalized)  History of falling  Other abnormalities of gait and mobility  Rationale for Evaluation and Treatment Rehabilitation  SUBJECTIVE:                                                                                                                                                                                             SUBJECTIVE STATEMENT: Pt reports no changes since last visit.  Pain is 3/10 in her sacrum from recent fall, no other  pain. Pt wearing her R ankle brace again, reports she likes to wear it when she is up and moving around. Unable to remember how to felt not wearing it last visit.  Pt's aide provides more info re the fall pt had a few weeks ago, per her report pt was stuck on the floor overnight due to not being able to get back up by herself. Pt had prevoiusly reported that she wasn't sure how she got back up.  Pt accompanied by: self   PERTINENT HISTORY: hx of hypertension, thrombotic stroke involving left middle cerebral artery, asthma, lung cancer, coronary calcification seen on CT and CKD   PAIN:  Are you having pain? Yes: NPRS scale: 3/10 Pain location: sacrum Pain description: soreness Aggravating factors: sitting for extended period of time Relieving factors: not sitting  PRECAUTIONS: Fall  PATIENT GOALS "to be able to walk without help and without pain"  OBJECTIVE:   TODAY'S TREATMENT: Ther Act: Attempted to review floor transfer. Initiated this by having pt attempt to go from long-sitting on mat table to quadruped position on hands and knees. Pt unable to motor plan how to get from long-sitting to quadruped even with max multimodal cueing. Pt also unable to attain position due to impaired core and LE strength. Encouraged pt to keep her cell phone with her at all times due to difficulty recovering from a fall independently.   GAIT: Gait pattern: decreased hip/knee flexion- Right and trendelenburg; mild instability Distance walked: 300 ft Assistive device utilized: None Level of assistance: CGA Comments: gait outdoors across uneven sidewalk; RPE 5/10 following ambulation  Resisted ambulation with bungee cord x 115 ft with CGA for balance with notable decrease in gait speed and increase in effort. Pt reports RPE 4/10 following gait with seated rest break needed to recover prior to addition repetitions of ambulation. Resisted ambulation with bungee cord with multidirectional perturbations 2 x  115 ft with CGA to min A, RPE 5/10, seated rest break between each repetition. Pt exhibits utilization of lateral and cross-over stepping strategies to recover balance with perturbations.   PATIENT EDUCATION: Education details: continue HEP, importance of foot placement for stability , keep cell phone on person in case of future fall Person educated: Patient Education method: Explanation, Demonstration, and Handouts Education comprehension: verbalized understanding   HOME EXERCISE PROGRAM: Access Code: [email or web address] URL: [email or web address] Date: 02/10/2022 Prepared by: Excell Seltzer  Exercises - Side Stepping with Resistance at Ankles and Counter Support  - 1 x daily - 7 x weekly - 3 sets - 10 reps - Forward Step Up with Counter Support  - 1 x daily - 7 x weekly - 3 sets - 10 reps - Towel Scrunches  - 1 x daily - 7 x weekly - 2 sets - 10 reps - Seated Ankle Alphabet  - 1 x daily - 7 x weekly - 1 sets - 10 reps - Mini Squat with Counter Support  - 1 x daily - 7 x weekly - 3 sets - 10 reps   GOALS: Goals reviewed with patient? Yes  SHORT TERM GOALS: Target date: 03/03/2022  Initiate HEP Baseline: initiated 8/8 Goal status: MET  2.  Pt will improve gait velocity to at least 2.5 ft/sec for improved gait efficiency and performance at Supervision level with LRAD Baseline: 2.26 ft/sec S* with RW (8/1), 2.35 ft/sec (8/24) Goal status: IN PROGRESS  3.  Pt will improve normal TUG to  less than or equal to 13 seconds for improved functional mobility and decreased fall risk. Baseline: 14 sec with RW (8/1), 11.91 sec with no AD (8/24) Goal status: MET  4.  Pt will improve Berg score to 40/56  for decreased fall risk Baseline: 34/56 (8/8); 37/56 on 9/6 Goal status: NOT MET  5.  Pt will improve 5 x STS to less than or equal to 12 seconds to demonstrate improved functional strength and transfer efficiency.  Baseline: 13.8 (8/1), 15.28 sec (8/24) Goal status: IN  PROGRESS   LONG TERM GOALS: Target date: 03/31/2022  Pt will be independent with balance and strengthening HEP for improved strength, balance, transfers and gait. Baseline:  Goal status: INITIAL  2.  Pt will improve gait velocity to at least 3.0 ft/sec for improved gait efficiency and performance at mod I level with LRAD Baseline: 2.26 ft/sec S* with RW (8/1), 2.35 ft/sec with RW (8/24) Goal status: REVISED  3.  Pt will improve normal TUG to less than or equal to 12 seconds for improved functional mobility and decreased fall risk. Baseline: 14 sec with RW (8/1), 11.91 sec with no AD (8/24) Goal status: INITIAL  4.  Pt will improve Berg score to 46/56 for decreased fall risk Baseline: 34/56 (8/8) Goal status: INITIAL  5.  Pt will improve 5 x STS to less than or equal to 10 seconds to demonstrate improved functional strength and transfer efficiency.  Baseline: 13.8 sec (8/1), 15.28 sec (8/24) Goal status: INITIAL   ASSESSMENT:  CLINICAL IMPRESSION: Emphasis of skilled PT session on attempting to perform floor transfer and reviewing fall recovery techniques as well as continuing to work on gait without AD and initiating stepping strategies with gait. Pt exhibits significant difficulty getting into quadruped position on mat in order to simulate a floor transfer. Pt continues to benefit from skilled therapy services to address ongoing balance and gait impairments as well as significant fall risk. Continue POC.    OBJECTIVE IMPAIRMENTS Abnormal gait, decreased balance, decreased coordination, decreased mobility, decreased strength, decreased safety awareness, and pain.   ACTIVITY LIMITATIONS carrying, lifting, bending, squatting, stairs, transfers, bathing, dressing, and hygiene/grooming  PARTICIPATION LIMITATIONS: meal prep, cleaning, laundry, medication management, driving, shopping, community activity, yard work, and school  PERSONAL FACTORS hx of hypertension, thrombotic stroke  involving left middle cerebral artery, asthma, lung cancer, coronary calcification seen on CT and CKD  are also affecting patient's functional outcome.   REHAB POTENTIAL: Good  CLINICAL DECISION MAKING: Stable/uncomplicated  EVALUATION COMPLEXITY: Moderate  PLAN: PT FREQUENCY: 2x/week  PT DURATION: 8 weeks  PLANNED INTERVENTIONS: Therapeutic exercises, Therapeutic activity, Neuromuscular re-education, Balance training, Gait training, Patient/Family education, Self Care, Joint mobilization, Stair training, DME instructions, Electrical stimulation, Cryotherapy, Moist heat, Taping, Manual therapy, and Re-evaluation  PLAN FOR NEXT SESSION:  add to HEP for balance and strengthening, obstacle course, LE coordination, tandem gait, backwards gait, keep working on gait with no AD (carrying stuff with no UE support, uneven surface)   Excell Seltzer, PT, DPT, CSRS 03/18/2022, 11:02 AM

## 2022-03-18 NOTE — Therapy (Signed)
OUTPATIENT SPEECH LANGUAGE PATHOLOGY TREATMENT   Patient Name: [name] MRN: [id number] DOB:05-Jul-1941, 81 y.o., female Today's Date: 03/18/2022  PCP: none on file REFERRING PROVIDER: Courtney Heys, MD   End of Session - 03/18/22 1101     Visit Number 8    Number of Visits 17    Date for SLP Re-Evaluation 03/31/22    Progress Note Due on Visit 10    SLP Start Time 1101    SLP Stop Time  1145    SLP Time Calculation (min) 44 min    Activity Tolerance Patient tolerated treatment well                   Past Medical History:  Diagnosis Date    Multiple pulmonary nodules, largest R mid lung 06/29/2013   Followed in Pulmonary clinic/ [facility]/ [place] - see CXR 06/27/13  - CT chest 07/14/2013 > 1. No acute findings in the thorax to account for the patient's  symptoms.  2. However, there is a subsolid nodule in the   right lower lobe that has a ground-glass attenuation component  measuring 2.5 x 1.8 cm, and a small central solid component  measuring 4 mm. Initial follow-up by chest CT without    Adenocarcinoma of lung, stage 1 ([place])    Status post right lower lobectomy August 2015   Anxiety    Asthma    Atrophic vaginitis    Chronic cough 12/20/2018   Chronic iritis of left eye    Pupil stays dilated; nonreactive   Chronic kidney disease, stage 3b (HCC)    Colon polyp    Contact lens/glasses fitting    wears contacts or glasses   Cough variant asthma with component of uacs  05/31/2013   Followed in Pulmonary clinic/ [facility]/ [facility] in her 49's - Spirometry 02/23/13 wnl  - 07/03/2013 Sinus CT > Mild chronic sinus disease - No acute findings. Left-to-right nasal septal deviation of 3 mm. -med calendar 08/08/13 , redone 06/01/2016 and 02/22/2017  - Eos 4%   10/13/2013  > rec singulair daily  - FENO 05/12/2016  =   24 on singulair  - Allergy profile 05/12/2016 >   IgE  47 pos   Depression    Depression with anxiety    Dysuria 08/03/2019   Environmental  allergies    Essential hypertension 02/23/2017   Changed losartan to avapro 02/22/2017 due to cough    GERD (gastroesophageal reflux disease)    Hypertension    Hypertriglyceridemia    Kidney stones    OA (osteoarthritis)    Osteopenia    PONV (postoperative nausea and vomiting)    S/P lobectomy of lung 02/12/2014   Status post total knee replacement, left 08/26/2018   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   Past Surgical History:  Procedure Laterality Date   APPENDECTOMY     COLONOSCOPY     EYE SURGERY Left    cataract removal   ORIF WRIST FRACTURE Left 08/04/2013   Procedure: OPEN REDUCTION INTERNAL FIXATION (ORIF) LEFT DISTAL RADIUS WRIST FRACTURE;  Surgeon: Wynonia Sours, MD;  Location: [place];  Service: Orthopedics;  Laterality: Left;   PARTIAL HYSTERECTOMY     TOTAL KNEE ARTHROPLASTY Left 08/26/2018   Procedure: TOTAL KNEE ARTHROPLASTY;  Surgeon: Netta Cedars, MD;  Location: [facility];  Service: Orthopedics;  Laterality: Left;   VIDEO ASSISTED THORACOSCOPY (VATS)/WEDGE RESECTION Right 02/12/2014   Procedure: RIGHT VIDEO ASSISTED THORACOSCOPY RIGHT LOWER LOBE LUNG /  WEDGE RESECTION,RIGHT THORACOTOMY WITH RIGHT LOWER LOBE LOBECTOMY & NODE DISSECTION;  Surgeon: Melrose Nakayama, MD;  Location: [place];  Service: Thoracic;  Laterality: Right;   VIDEO ASSISTED THORACOSCOPY (VATS)/WEDGE RESECTION Right 02/12/2014   VIDEO BRONCHOSCOPY N/A 02/12/2014   Procedure: VIDEO BRONCHOSCOPY;  Surgeon: Melrose Nakayama, MD;  Location: [place];  Service: Thoracic;  Laterality: N/A;   Patient Active Problem List   Diagnosis Date Noted   Acute ischemic left MCA stroke ([place]) 01/28/2022   Aphasia as late effect of cerebrovascular accident (CVA) 01/28/2022   Right hemiparesis ([place]) 01/28/2022   Abnormal gait due to muscle weakness 01/28/2022   Insomnia 10/13/2021   Frequency of urination 10/13/2021   Vitamin D deficiency 10/13/2021   Chronic renal failure 10/13/2021   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[place]) 09/24/2021   Aphasia due to acute cerebrovascular accident (CVA) ([place]) 09/21/2021   Chronic kidney disease, stage 3b ([place]) 09/21/2021   Depression with anxiety 09/21/2021   Hypertension 09/21/2021   Asthma, chronic 09/21/2021   Fall at home, initial encounter 09/21/2021   Right ankle pain 09/21/2021   Dysuria 08/03/2019   Chronic cough 12/20/2018   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   Status post total knee replacement, left 08/26/2018   Essential hypertension 02/23/2017   Adenocarcinoma of lung, stage 1 ([place]) 05/08/2014   S/P lobectomy of lung 02/12/2014   Asthma 01/30/2014    Multiple pulmonary nodules, largest R mid lung 06/29/2013   Cough variant asthma with component of uacs  05/31/2013    ONSET DATE: March 2023, referral July 2023   REFERRING DIAG:  I69.320 (ICD-10-CM) - Aphasia as late effect of cerebrovascular accident (CVA)  I63.512 (ICD-10-CM) - Acute ischemic left MCA stroke (HCC)    THERAPY DIAG:  Apraxia  Cognitive communication deficit  Aphasia  Rationale for Evaluation and Treatment Rehabilitation  SUBJECTIVE:   SUBJECTIVE STATEMENT: "With a friend" re: upcoming beach trip  PAIN:  Are you having pain? No   OBJECTIVE:  TODAY'S TREATMENT:  03-18-22:Target verb ID and use at sentence level in picture description task. Given photo, pt able to generate simple sentence with appropriate syntax in 14/15. Generates complex sentence providing additional information in 12/15 trials. Occasional min-A, use of questioning cues to aid in completion of task, x1 cue for anomia strategy use of cueing with first letter. Pt reports upcoming beach trip. Use topic in discourse level task, targeting word finding and cohesiveness at discourse level. Pt generates details, given framework for narrative featuring beginning, middle, end. Pt wih usual vague language, agrammatical. Generate script for practice, then attempt  discourse again with improved cohesiveness demonstrated. Details provided increased from x3 to x11 following script practice. Pt able to teach back anomia strategy of self-cueing for initial letter to aid in word finding.   03-13-22: Target identification of dysnomia and word finding strategy use in discourse level, structured task using Novel Approach to Real-life Communication (NARNIA) framework. Word generation and sentence production were primed using action pictures. Pt was able to produce 11 nouns and 11 verbs, creating simple sentence structures on 8 trials and complex, expanded sentences on 3 trials given usual mod-A. Self-ID paraphasias x2, overt anomia x4 requiring SLP semantic and phonemic cueing to repair. Pt selected preferred topic, resulting in procedural type discourse. Using visual framework for organization, pt provided x7 details with usual max-A. Using produced visual aid, pt produced planned discourse notable for reduced overall organization and connectedness impacting successful procedural discourse sample. Agrammatical, omission  of tools and steps. Given rating scale, pt scored their performance as 7/8, vs SLP rating of 3/8 indicating reduced awareness of discourse self-perception. Will implement recording with pt's consent next trial to facilitate improved self-evaluation.  03-11-22: Target word finding through variety of tasks, increasing in complexity. Given x4 items in category, pt able to ID correct category in 8/10 trials with rare repetition. Given a category, pt able to generate x5, x10, x10 across respective trials with usual semantic and phonemic cueing for initial trial faded to supervision-A for subsequent trials. Pt demonstrating self-correction of paraphasias throughout task in all opportunities, reports this has been improving at home. Demonstrates self-cueing when anomia occurs x3 during naming task. No instances of uncorrected paraphasias this date. Pt able to copy x10 items  from SLP model accurately with rare self-correction required. Pt says "wow, that's much better!" Re: her writing. Notable improvement in alternating attention and accuracy of written items this date vs prior writing attempts. Pt accurately writes x6 family member names, initial attempt. Given letters of target names in randomized order, pt able to organize into correct order and then copy for x4 names which unable to spell initially.    PATIENT EDUCATION: Education details: see above Person educated: Patient Education method: Customer service manager Education comprehension: verbalized understanding, returned demonstration, and needs further education   GOALS: Goals reviewed with patient? Yes  SHORT TERM GOALS: Target date: 03/03/22  Pt will verbalize 7 items in personally relevant categories with occasional min-A over 2 therapy sessions Baseline: 02-24-22; 02/26/22 Goal status: MET  2.  Pt will correct semantic paraphasia given usual mod-A in 70% of opportunities during structured task over 2 sessions.  Baseline: 02-24-22; 02-26-22 Goal status: MET  3.  Pt will write x5 item list with 80% accuracy, given usual mod-A over 2 sessions.  Baseline: 02-24-22; 02-26-22 Goal status: NOT MET  4.  Pt will successfully use trained anomia compensation in x3 opportunities across 2 therapy sessions with occasioanl min-A.  Baseline: 02-24-22; 02-26-22 Goal status: PARTIALLY MET   LONG TERM GOALS: Target date: 03/31/2022  Pt will identify, and correct, 50% of semantic paraphasias during structured task with occasional min-A over 2 sessions.  Baseline:  Goal status: IN PROGRESS  2.  Pt will report improvement of 2 points via CPIB PROM by d/c Baseline: 15 Goal status: IN PROGRESS  3.  Pt will carryover dysnomia compensations to conversational speech, resulting in strategy use in 70% of opportunities over 15 minute conversation Baseline:  Goal status: IN PROGRESS  4.  Pt will write x10 phrases,  with mod-I, with 80% accuracy over 2 sessions.   Baseline:  Goal status: IN PROGRESS  ASSESSMENT:  CLINICAL IMPRESSION: Patient is a 81 y.o. F presenting with mild-moderate expressive aphasia. Receptive language skills appear to be within gross normal limits. Spontaneous speech is characterized by usual vague language, anomia and frequent paraphasias impacting clarity and cohesiveness of message. Emerging awareness of paraphasias exhibited. Written expression impaired, with SLP initiating skilled interventions and strategies to aid in abilites for desired functional tasks at home such as grocery list creation. SLP has initiated structured language tasks to aid in improved expressive language and enhanced communication confidence and efficacy. I recommend continued skilled ST to address expressive aphasia.  OBJECTIVE IMPAIRMENTS include expressive language, aphasia, and apraxia. These impairments are limiting patient from effectively communicating at home and in community. Factors affecting potential to achieve goals and functional outcome are family/community support. Patient will benefit from skilled SLP services to  address above impairments and improve overall function.  REHAB POTENTIAL: Good  PLAN: SLP FREQUENCY: 2x/week  SLP DURATION: 8 weeks  PLANNED INTERVENTIONS: Language facilitation, Cueing hierachy, Internal/external aids, Functional tasks, Multimodal communication approach, SLP instruction and feedback, Compensatory strategies, and Patient/family education    Su Monks, CCC-SLP 03/18/2022, 11:02 AM

## 2022-03-18 NOTE — Patient Instructions (Addendum)
[employer] - best ice cream! @ [place]  There is a Oncologist that you can walk with your roll-a-tor with benches    Me and Shauna Hugh are going to [place]. We're going next week, Sunday to Friday.   Diane is planning the trip. We are staying somewhere old but renovated.   We will relax, eat out, people watch.   I hope to see the beach from the [place].   I want to have a new outing a [employer].

## 2022-03-20 ENCOUNTER — Ambulatory Visit: Payer: Medicare Other | Admitting: Speech Pathology

## 2022-03-20 ENCOUNTER — Ambulatory Visit: Payer: Medicare Other | Admitting: Physical Therapy

## 2022-03-20 DIAGNOSIS — R278 Other lack of coordination: Secondary | ICD-10-CM | POA: Diagnosis not present

## 2022-03-20 DIAGNOSIS — R4701 Aphasia: Secondary | ICD-10-CM | POA: Diagnosis not present

## 2022-03-20 DIAGNOSIS — I69351 Hemiplegia and hemiparesis following cerebral infarction affecting right dominant side: Secondary | ICD-10-CM | POA: Diagnosis not present

## 2022-03-20 DIAGNOSIS — M6281 Muscle weakness (generalized): Secondary | ICD-10-CM

## 2022-03-20 DIAGNOSIS — R2689 Other abnormalities of gait and mobility: Secondary | ICD-10-CM

## 2022-03-20 DIAGNOSIS — Z9181 History of falling: Secondary | ICD-10-CM | POA: Diagnosis not present

## 2022-03-20 DIAGNOSIS — R482 Apraxia: Secondary | ICD-10-CM

## 2022-03-20 DIAGNOSIS — R41841 Cognitive communication deficit: Secondary | ICD-10-CM

## 2022-03-20 NOTE — Therapy (Signed)
OUTPATIENT PHYSICAL THERAPY NEURO THERAPY   Patient Name: [name] MRN: [id number] DOB:[birth year], 81 y.o., female Today's Date: 03/20/2022   PCP: Pt unable to provide information and unable to locate in chart. REFERRING PROVIDER: Courtney Heys, MD      PT End of Session - 03/20/22 1026     Visit Number 11    Number of Visits 17    Date for PT Re-Evaluation 03/31/22    Authorization Type [employer] Medicare    Progress Note Due on Visit 10    PT Start Time 1025   pt late   PT Stop Time 1100    PT Time Calculation (min) 35 min    Equipment Utilized During Treatment Gait belt    Activity Tolerance Patient tolerated treatment well    Behavior During Therapy WFL for tasks assessed/performed                  Past Medical History:  Diagnosis Date    Multiple pulmonary nodules, largest R mid lung 06/29/2013   Followed in Pulmonary clinic/ [facility]/ [place] - see CXR 06/27/13  - CT chest 07/14/2013 > 1. No acute findings in the thorax to account for the patient's  symptoms.  2. However, there is a subsolid nodule in the   right lower lobe that has a ground-glass attenuation component  measuring 2.5 x 1.8 cm, and a small central solid component  measuring 4 mm. Initial follow-up by chest CT without    Adenocarcinoma of lung, stage 1 ([place])    Status post right lower lobectomy August 2015   Anxiety    Asthma    Atrophic vaginitis    Chronic cough 12/20/2018   Chronic iritis of left eye    Pupil stays dilated; nonreactive   Chronic kidney disease, stage 3b (HCC)    Colon polyp    Contact lens/glasses fitting    wears contacts or glasses   Cough variant asthma with component of uacs  05/31/2013   Followed in Pulmonary clinic/ [facility]/ [facility] in her 40's - Spirometry 02/23/13 wnl  - 07/03/2013 Sinus CT > Mild chronic sinus disease - No acute findings. Left-to-right nasal septal deviation of 3 mm. -med calendar 08/08/13 , redone 06/01/2016 and 02/22/2017  -  Eos 4%   10/13/2013  > rec singulair daily  - FENO 05/12/2016  =   24 on singulair  - Allergy profile 05/12/2016 >   IgE  47 pos   Depression    Depression with anxiety    Dysuria 08/03/2019   Environmental allergies    Essential hypertension 02/23/2017   Changed losartan to avapro 02/22/2017 due to cough    GERD (gastroesophageal reflux disease)    Hypertension    Hypertriglyceridemia    Kidney stones    OA (osteoarthritis)    Osteopenia    PONV (postoperative nausea and vomiting)    S/P lobectomy of lung 02/12/2014   Status post total knee replacement, left 08/26/2018   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   Past Surgical History:  Procedure Laterality Date   APPENDECTOMY     COLONOSCOPY     EYE SURGERY Left    cataract removal   ORIF WRIST FRACTURE Left 08/04/2013   Procedure: OPEN REDUCTION INTERNAL FIXATION (ORIF) LEFT DISTAL RADIUS WRIST FRACTURE;  Surgeon: Wynonia Sours, MD;  Location: [place];  Service: Orthopedics;  Laterality: Left;   PARTIAL HYSTERECTOMY     TOTAL KNEE ARTHROPLASTY Left 08/26/2018  Procedure: TOTAL KNEE ARTHROPLASTY;  Surgeon: Netta Cedars, MD;  [facility];  Service: Orthopedics;  Laterality: Left;   VIDEO ASSISTED THORACOSCOPY (VATS)/WEDGE RESECTION Right 02/12/2014   Procedure: RIGHT VIDEO ASSISTED THORACOSCOPY RIGHT LOWER LOBE LUNG /WEDGE RESECTION,RIGHT THORACOTOMY WITH RIGHT LOWER LOBE LOBECTOMY & NODE DISSECTION;  Surgeon: Melrose Nakayama, MD;  Location: [place];  Service: Thoracic;  Laterality: Right;   VIDEO ASSISTED THORACOSCOPY (VATS)/WEDGE RESECTION Right 02/12/2014   VIDEO BRONCHOSCOPY N/A 02/12/2014   Procedure: VIDEO BRONCHOSCOPY;  Surgeon: Melrose Nakayama, MD;  Location: [place];  Service: Thoracic;  Laterality: N/A;   Patient Active Problem List   Diagnosis Date Noted   Acute ischemic left MCA stroke ([place]) 01/28/2022   Aphasia as late effect of cerebrovascular accident (CVA) 01/28/2022   Right hemiparesis ([place])  01/28/2022   Abnormal gait due to muscle weakness 01/28/2022   Insomnia 10/13/2021   Frequency of urination 10/13/2021   Vitamin D deficiency 10/13/2021   Chronic renal failure 10/13/2021   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[place]) 09/24/2021   Aphasia due to acute cerebrovascular accident (CVA) ([place]) 09/21/2021   Chronic kidney disease, stage 3b ([place]) 09/21/2021   Depression with anxiety 09/21/2021   Hypertension 09/21/2021   Asthma, chronic 09/21/2021   Fall at home, initial encounter 09/21/2021   Right ankle pain 09/21/2021   Dysuria 08/03/2019   Chronic cough 12/20/2018   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   Status post total knee replacement, left 08/26/2018   Essential hypertension 02/23/2017   Adenocarcinoma of lung, stage 1 ([place]) 05/08/2014   S/P lobectomy of lung 02/12/2014   Asthma 01/30/2014    Multiple pulmonary nodules, largest R mid lung 06/29/2013   Cough variant asthma with component of uacs  05/31/2013    ONSET DATE: 01/28/2022   REFERRING DIAG: G81.91 (ICD-10-CM) - Right hemiparesis (HCC) I63.512 (ICD-10-CM) - Acute ischemic left MCA stroke (HCC)   THERAPY DIAG:  Muscle weakness (generalized)  History of falling  Other abnormalities of gait and mobility  Rationale for Evaluation and Treatment Rehabilitation  SUBJECTIVE:                                                                                                                                                                                             SUBJECTIVE STATEMENT: Pt reports no pain, no changes since last session. No falls or near falls.  Pt accompanied by: self   PERTINENT HISTORY: hx of hypertension, thrombotic stroke involving left middle cerebral artery, asthma, lung cancer, coronary calcification seen on CT and CKD   PAIN:  Are you having pain? No  PRECAUTIONS:  Fall  PATIENT GOALS "to be able to walk without help and without pain"  OBJECTIVE:    TODAY'S TREATMENT: Ther Act: Ambulation through obstacle course walking across uneven mat, attempt to step over 4" hurdles but pt exhibits significant difficulty clearing obstacles so transitioned to walking stick step-overs with CGA for balance with no AD  Ambulation through obstacle course as described above while carrying cup of water with CGA for balance, several instances of "stumbles" but able to recover with CGA. Pt does spill water several times while navigating obstacle course due to inattention to the water cup in her R hand tipping the cup to the side slightly.  Sit to stand and weighted punch-outs 2 x 10 reps with 3 kg weighted ball  Static standing balance with normal stance on airex performing ball toss, 2 x 10 reps with minor LOB that pt able to recover with UE support on back of chair  GAIT: Ambulation x 115 ft around therapy gym with CGA and no AD, minor path deviation noted. Ambulation x 50 ft in therapy gym with CGA and no AD while carrying a full cup of water to simulate pt carrying coffee at home, no LOB and spilling of water noted across level ground.   PATIENT EDUCATION: Education details: continue HEP Person educated: Patient Education method: Explanation, Demonstration, and Handouts Education comprehension: verbalized understanding   HOME EXERCISE PROGRAM: Access Code: [email or web address] URL: [email or web address] Date: 02/10/2022 Prepared by: Excell Seltzer  Exercises - Side Stepping with Resistance at Ankles and Counter Support  - 1 x daily - 7 x weekly - 3 sets - 10 reps - Forward Step Up with Counter Support  - 1 x daily - 7 x weekly - 3 sets - 10 reps - Towel Scrunches  - 1 x daily - 7 x weekly - 2 sets - 10 reps - Seated Ankle Alphabet  - 1 x daily - 7 x weekly - 1 sets - 10 reps - Mini Squat with Counter Support  - 1 x daily - 7 x weekly - 3 sets - 10 reps   GOALS: Goals reviewed with patient? Yes  SHORT TERM GOALS: Target date:  03/03/2022  Initiate HEP Baseline: initiated 8/8 Goal status: MET  2.  Pt will improve gait velocity to at least 2.5 ft/sec for improved gait efficiency and performance at Supervision level with LRAD Baseline: 2.26 ft/sec S* with RW (8/1), 2.35 ft/sec (8/24) Goal status: IN PROGRESS  3.  Pt will improve normal TUG to less than or equal to 13 seconds for improved functional mobility and decreased fall risk. Baseline: 14 sec with RW (8/1), 11.91 sec with no AD (8/24) Goal status: MET  4.  Pt will improve Berg score to 40/56  for decreased fall risk Baseline: 34/56 (8/8); 37/56 on 9/6 Goal status: NOT MET  5.  Pt will improve 5 x STS to less than or equal to 12 seconds to demonstrate improved functional strength and transfer efficiency.  Baseline: 13.8 (8/1), 15.28 sec (8/24) Goal status: IN PROGRESS   LONG TERM GOALS: Target date: 03/31/2022  Pt will be independent with balance and strengthening HEP for improved strength, balance, transfers and gait. Baseline:  Goal status: INITIAL  2.  Pt will improve gait velocity to at least 3.0 ft/sec for improved gait efficiency and performance at mod I level with LRAD Baseline: 2.26 ft/sec S* with RW (8/1), 2.35 ft/sec with RW (8/24) Goal status: REVISED  3.  Pt will improve normal TUG to less  than or equal to 12 seconds for improved functional mobility and decreased fall risk. Baseline: 14 sec with RW (8/1), 11.91 sec with no AD (8/24) Goal status: INITIAL  4.  Pt will improve Berg score to 46/56 for decreased fall risk Baseline: 34/56 (8/8) Goal status: INITIAL  5.  Pt will improve 5 x STS to less than or equal to 10 seconds to demonstrate improved functional strength and transfer efficiency.  Baseline: 13.8 sec (8/1), 15.28 sec (8/24) Goal status: INITIAL   ASSESSMENT:  CLINICAL IMPRESSION: Emphasis of skilled PT session on working on higher level gait and balance without use of AD and while dual-tasking. Pt exhibits improved  gait without AD support this session. Pt does exhibit several LOB while dual tasking (carrying water) during gait especially with increased balance challenges such as obstacles and uneven ground. Pt continues to benefit from skilled therapy services to address ongoing gait and balance impairments. This session somewhat limited due to pt late arrival. Continue POC.    OBJECTIVE IMPAIRMENTS Abnormal gait, decreased balance, decreased coordination, decreased mobility, decreased strength, decreased safety awareness, and pain.   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   REHAB POTENTIAL: Good  CLINICAL DECISION MAKING: Stable/uncomplicated  EVALUATION COMPLEXITY: Moderate  PLAN: PT FREQUENCY: 2x/week  PT DURATION: 8 weeks  PLANNED INTERVENTIONS: Therapeutic exercises, Therapeutic activity, Neuromuscular re-education, Balance training, Gait training, Patient/Family education, Self Care, Joint mobilization, Stair training, DME instructions, Electrical stimulation, Cryotherapy, Moist heat, Taping, Manual therapy, and Re-evaluation  PLAN FOR NEXT SESSION:  add to HEP for balance and strengthening, obstacle course, LE coordination, tandem gait, backwards gait, keep working on gait with no AD (carrying stuff with no UE support, uneven surface)   Excell Seltzer, PT, DPT, CSRS 03/20/2022, 11:04 AM

## 2022-03-20 NOTE — Therapy (Signed)
OUTPATIENT SPEECH LANGUAGE PATHOLOGY TREATMENT   Patient Name: [name] MRN: [id number] DOB:March 30, 1941, 81 y.o., female Today's Date: 03/20/2022  PCP: none on file REFERRING PROVIDER: Courtney Heys, MD   End of Session - 03/20/22 1100     Visit Number 9    Number of Visits 17    Date for SLP Re-Evaluation 03/31/22    Progress Note Due on Visit 10    SLP Start Time 1100    SLP Stop Time  1145    SLP Time Calculation (min) 45 min    Activity Tolerance Patient tolerated treatment well                    Past Medical History:  Diagnosis Date    Multiple pulmonary nodules, largest R mid lung 06/29/2013   Followed in Pulmonary clinic/ [facility]/ [facility] - see CXR 06/27/13  - CT chest 07/14/2013 > 1. No acute findings in the thorax to account for the patient's  symptoms.  2. However, there is a subsolid nodule in the   right lower lobe that has a ground-glass attenuation component  measuring 2.5 x 1.8 cm, and a small central solid component  measuring 4 mm. Initial follow-up by chest CT without    Adenocarcinoma of lung, stage 1 ([place])    Status post right lower lobectomy August 2015   Anxiety    Asthma    Atrophic vaginitis    Chronic cough 12/20/2018   Chronic iritis of left eye    Pupil stays dilated; nonreactive   Chronic kidney disease, stage 3b ([facility])    Colon polyp    Contact lens/glasses fitting    wears contacts or glasses   Cough variant asthma with component of uacs  05/31/2013   Followed in Pulmonary clinic/ [facility]/ [facility] in her 89's - Spirometry 02/23/13 wnl  - 07/03/2013 Sinus CT > Mild chronic sinus disease - No acute findings. Left-to-right nasal septal deviation of 3 mm. -med calendar 08/08/13 , redone 06/01/2016 and 02/22/2017  - Eos 4%   10/13/2013  > rec singulair daily  - FENO 05/12/2016  =   24 on singulair  - Allergy profile 05/12/2016 >   IgE  47 pos   Depression    Depression with anxiety    Dysuria 08/03/2019   Environmental  allergies    Essential hypertension 02/23/2017   Changed losartan to avapro 02/22/2017 due to cough    GERD (gastroesophageal reflux disease)    Hypertension    Hypertriglyceridemia    Kidney stones    OA (osteoarthritis)    Osteopenia    PONV (postoperative nausea and vomiting)    S/P lobectomy of lung 02/12/2014   Status post total knee replacement, left 08/26/2018   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   Past Surgical History:  Procedure Laterality Date   APPENDECTOMY     COLONOSCOPY     EYE SURGERY Left    cataract removal   ORIF WRIST FRACTURE Left 08/04/2013   Procedure: OPEN REDUCTION INTERNAL FIXATION (ORIF) LEFT DISTAL RADIUS WRIST FRACTURE;  Surgeon: Wynonia Sours, MD;  Location: [place];  Service: Orthopedics;  Laterality: Left;   PARTIAL HYSTERECTOMY     TOTAL KNEE ARTHROPLASTY Left 08/26/2018   Procedure: TOTAL KNEE ARTHROPLASTY;  Surgeon: Netta Cedars, MD;  Location: [facility];  Service: Orthopedics;  Laterality: Left;   VIDEO ASSISTED THORACOSCOPY (VATS)/WEDGE RESECTION Right 02/12/2014   Procedure: RIGHT VIDEO ASSISTED THORACOSCOPY RIGHT LOWER LOBE  LUNG /WEDGE RESECTION,RIGHT THORACOTOMY WITH RIGHT LOWER LOBE LOBECTOMY & NODE DISSECTION;  Surgeon: Melrose Nakayama, MD;  Location: [place];  Service: Thoracic;  Laterality: Right;   VIDEO ASSISTED THORACOSCOPY (VATS)/WEDGE RESECTION Right 02/12/2014   VIDEO BRONCHOSCOPY N/A 02/12/2014   Procedure: VIDEO BRONCHOSCOPY;  Surgeon: Melrose Nakayama, MD;  Location: [place];  Service: Thoracic;  Laterality: N/A;   Patient Active Problem List   Diagnosis Date Noted   Acute ischemic left MCA stroke ([place]) 01/28/2022   Aphasia as late effect of cerebrovascular accident (CVA) 01/28/2022   Right hemiparesis ([place]) 01/28/2022   Abnormal gait due to muscle weakness 01/28/2022   Insomnia 10/13/2021   Frequency of urination 10/13/2021   Vitamin D deficiency 10/13/2021   Chronic renal failure 10/13/2021   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[place]) 09/24/2021   Aphasia due to acute cerebrovascular accident (CVA) ([place]) 09/21/2021   Chronic kidney disease, stage 3b ([place]) 09/21/2021   Depression with anxiety 09/21/2021   Hypertension 09/21/2021   Asthma, chronic 09/21/2021   Fall at home, initial encounter 09/21/2021   Right ankle pain 09/21/2021   Dysuria 08/03/2019   Chronic cough 12/20/2018   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   Status post total knee replacement, left 08/26/2018   Essential hypertension 02/23/2017   Adenocarcinoma of lung, stage 1 ([place]) 05/08/2014   S/P lobectomy of lung 02/12/2014   Asthma 01/30/2014    Multiple pulmonary nodules, largest R mid lung 06/29/2013   Cough variant asthma with component of uacs  05/31/2013    ONSET DATE: March 2023, referral July 2023   REFERRING DIAG:  I69.320 (ICD-10-CM) - Aphasia as late effect of cerebrovascular accident (CVA)  I63.512 (ICD-10-CM) - Acute ischemic left MCA stroke ([place])    THERAPY DIAG:  Apraxia  Cognitive communication deficit  Aphasia  Rationale for Evaluation and Treatment Rehabilitation  SUBJECTIVE:   SUBJECTIVE STATEMENT: "I've got to have some rest"   PAIN:  Are you having pain? No   OBJECTIVE:  TODAY'S TREATMENT:  03-20-22: Target word finding through divergent naming of abstract categories. Pt generates x3 items in x10 categories with supervision-A provided with usual extended time. Targeted word finding, verbal apraxia, sentence generation with Pharmacist, hospital ([facility]). For the verbs bake, drive, fix, the patient generated 3 subject and objects for each verb, a total of 9 subjects and 9 objects with usual verbal cues required following independent generation of initial subject/verb. Pt generated complex sentences for each verb, by answering "wh" questions with usual verbal cues, for a total of 6 complex sentences. Following, pt able to relay last time friend fixed  something in her home, demonstrating usual anomia and halting speech. Verbal cues to describe intermittently successful. Discussion with daughter at conclusion of session, highlighting some concerns: sequencing of daily tasks, cell phone usage, short term memory. Will consider for re-recert.   03-18-22:Target verb ID and use at sentence level in picture description task. Given photo, pt able to generate simple sentence with appropriate syntax in 14/15. Generates complex sentence providing additional information in 12/15 trials. Occasional min-A, use of questioning cues to aid in completion of task, x1 cue for anomia strategy use of cueing with first letter. Pt reports upcoming beach trip. Use topic in discourse level task, targeting word finding and cohesiveness at discourse level. Pt generates details, given framework for narrative featuring beginning, middle, end. Pt wih usual vague language, agrammatical. Generate script for practice, then attempt discourse again with improved cohesiveness demonstrated.  Details provided increased from x3 to x11 following script practice. Pt able to teach back anomia strategy of self-cueing for initial letter to aid in word finding.    PATIENT EDUCATION: Education details: see above Person educated: Patient Education method: Customer service manager Education comprehension: verbalized understanding, returned demonstration, and needs further education   GOALS: Goals reviewed with patient? Yes  SHORT TERM GOALS: Target date: 03/03/22  Pt will verbalize 7 items in personally relevant categories with occasional min-A over 2 therapy sessions Baseline: 02-24-22; 02/26/22 Goal status: MET  2.  Pt will correct semantic paraphasia given usual mod-A in 70% of opportunities during structured task over 2 sessions.  Baseline: 02-24-22; 02-26-22 Goal status: MET  3.  Pt will write x5 item list with 80% accuracy, given usual mod-A over 2 sessions.  Baseline: 02-24-22;  02-26-22 Goal status: NOT MET  4.  Pt will successfully use trained anomia compensation in x3 opportunities across 2 therapy sessions with occasioanl min-A.  Baseline: 02-24-22; 02-26-22 Goal status: PARTIALLY MET   LONG TERM GOALS: Target date: 03/31/2022  Pt will identify, and correct, 50% of semantic paraphasias during structured task with occasional min-A over 2 sessions.  Baseline:  Goal status: IN PROGRESS  2.  Pt will report improvement of 2 points via CPIB PROM by d/c Baseline: 15 Goal status: IN PROGRESS  3.  Pt will carryover dysnomia compensations to conversational speech, resulting in strategy use in 70% of opportunities over 15 minute conversation Baseline:  Goal status: IN PROGRESS  4.  Pt will write x10 phrases, with mod-I, with 80% accuracy over 2 sessions.   Baseline:  Goal status: IN PROGRESS  ASSESSMENT:  CLINICAL IMPRESSION: Patient is a 81 y.o. F presenting with mild-moderate expressive aphasia. Receptive language skills appear to be within gross normal limits. Spontaneous speech is characterized by usual vague language, anomia and frequent paraphasias impacting clarity and cohesiveness of message. Emerging awareness of paraphasias exhibited. Written expression impaired, with SLP initiating skilled interventions and strategies to aid in abilites for desired functional tasks at home such as grocery list creation. SLP has initiated structured language tasks to aid in improved expressive language and enhanced communication confidence and efficacy. I recommend continued skilled ST to address expressive aphasia.  OBJECTIVE IMPAIRMENTS include expressive language, aphasia, and apraxia. These impairments are limiting patient from effectively communicating at home and in community. Factors affecting potential to achieve goals and functional outcome are family/community support. Patient will benefit from skilled SLP services to address above impairments and improve overall  function.  REHAB POTENTIAL: Good  PLAN: SLP FREQUENCY: 2x/week  SLP DURATION: 8 weeks  PLANNED INTERVENTIONS: Language facilitation, Cueing hierachy, Internal/external aids, Functional tasks, Multimodal communication approach, SLP instruction and feedback, Compensatory strategies, and Patient/family education    Su Monks, CCC-SLP 03/20/2022, 11:00 AM

## 2022-03-24 ENCOUNTER — Ambulatory Visit: Payer: Medicare Other | Admitting: Speech Pathology

## 2022-03-24 DIAGNOSIS — R278 Other lack of coordination: Secondary | ICD-10-CM | POA: Diagnosis not present

## 2022-03-24 DIAGNOSIS — R4701 Aphasia: Secondary | ICD-10-CM | POA: Diagnosis not present

## 2022-03-24 DIAGNOSIS — R482 Apraxia: Secondary | ICD-10-CM | POA: Diagnosis not present

## 2022-03-24 DIAGNOSIS — I69351 Hemiplegia and hemiparesis following cerebral infarction affecting right dominant side: Secondary | ICD-10-CM | POA: Diagnosis not present

## 2022-03-24 DIAGNOSIS — R41841 Cognitive communication deficit: Secondary | ICD-10-CM

## 2022-03-24 DIAGNOSIS — R2689 Other abnormalities of gait and mobility: Secondary | ICD-10-CM | POA: Diagnosis not present

## 2022-03-24 DIAGNOSIS — Z9181 History of falling: Secondary | ICD-10-CM | POA: Diagnosis not present

## 2022-03-24 DIAGNOSIS — M6281 Muscle weakness (generalized): Secondary | ICD-10-CM | POA: Diagnosis not present

## 2022-03-24 NOTE — Therapy (Signed)
OUTPATIENT SPEECH LANGUAGE PATHOLOGY TREATMENT   Patient Name: [name] MRN: [id number] DOB:1941/01/09, 81 y.o., female Today's Date: 03/24/2022  PCP: none on file REFERRING PROVIDER: Courtney Heys, MD   End of Session - 03/24/22 1233     Visit Number 10    Number of Visits 17    Date for SLP Re-Evaluation 03/31/22    Progress Note Due on Visit 10    SLP Start Time 1233    SLP Stop Time  1315    SLP Time Calculation (min) 42 min    Activity Tolerance Patient tolerated treatment well                     Past Medical History:  Diagnosis Date    Multiple pulmonary nodules, largest R mid lung 06/29/2013   Followed in Pulmonary clinic/ [facility]/ [facility] - see CXR 06/27/13  - CT chest 07/14/2013 > 1. No acute findings in the thorax to account for the patient's  symptoms.  2. However, there is a subsolid nodule in the   right lower lobe that has a ground-glass attenuation component  measuring 2.5 x 1.8 cm, and a small central solid component  measuring 4 mm. Initial follow-up by chest CT without    Adenocarcinoma of lung, stage 1 ([place])    Status post right lower lobectomy August 2015   Anxiety    Asthma    Atrophic vaginitis    Chronic cough 12/20/2018   Chronic iritis of left eye    Pupil stays dilated; nonreactive   Chronic kidney disease, stage 3b ([facility])    Colon polyp    Contact lens/glasses fitting    wears contacts or glasses   Cough variant asthma with component of uacs  05/31/2013   Followed in Pulmonary clinic/ [facility]/ [facility] in her 44's - Spirometry 02/23/13 wnl  - 07/03/2013 Sinus CT > Mild chronic sinus disease - No acute findings. Left-to-right nasal septal deviation of 3 mm. -med calendar 08/08/13 , redone 06/01/2016 and 02/22/2017  - Eos 4%   10/13/2013  > rec singulair daily  - FENO 05/12/2016  =   24 on singulair  - Allergy profile 05/12/2016 >   IgE  47 pos   Depression    Depression with anxiety    Dysuria 08/03/2019    Environmental allergies    Essential hypertension 02/23/2017   Changed losartan to avapro 02/22/2017 due to cough    GERD (gastroesophageal reflux disease)    Hypertension    Hypertriglyceridemia    Kidney stones    OA (osteoarthritis)    Osteopenia    PONV (postoperative nausea and vomiting)    S/P lobectomy of lung 02/12/2014   Status post total knee replacement, left 08/26/2018   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   Past Surgical History:  Procedure Laterality Date   APPENDECTOMY     COLONOSCOPY     EYE SURGERY Left    cataract removal   ORIF WRIST FRACTURE Left 08/04/2013   Procedure: OPEN REDUCTION INTERNAL FIXATION (ORIF) LEFT DISTAL RADIUS WRIST FRACTURE;  Surgeon: Wynonia Sours, MD;  Location: [place];  Service: Orthopedics;  Laterality: Left;   PARTIAL HYSTERECTOMY     TOTAL KNEE ARTHROPLASTY Left 08/26/2018   Procedure: TOTAL KNEE ARTHROPLASTY;  Surgeon: Netta Cedars, MD;  [facility]: [facility];  Service: Orthopedics;  Laterality: Left;   VIDEO ASSISTED THORACOSCOPY (VATS)/WEDGE RESECTION Right 02/12/2014   Procedure: RIGHT VIDEO ASSISTED THORACOSCOPY RIGHT LOWER  LOBE LUNG /WEDGE RESECTION,RIGHT THORACOTOMY WITH RIGHT LOWER LOBE LOBECTOMY & NODE DISSECTION;  Surgeon: Melrose Nakayama, MD;  Location: [place];  Service: Thoracic;  Laterality: Right;   VIDEO ASSISTED THORACOSCOPY (VATS)/WEDGE RESECTION Right 02/12/2014   VIDEO BRONCHOSCOPY N/A 02/12/2014   Procedure: VIDEO BRONCHOSCOPY;  Surgeon: Melrose Nakayama, MD;  Location: [place];  Service: Thoracic;  Laterality: N/A;   Patient Active Problem List   Diagnosis Date Noted   Acute ischemic left MCA stroke ([place]) 01/28/2022   Aphasia as late effect of cerebrovascular accident (CVA) 01/28/2022   Right hemiparesis ([place]) 01/28/2022   Abnormal gait due to muscle weakness 01/28/2022   Insomnia 10/13/2021   Frequency of urination 10/13/2021   Vitamin D deficiency 10/13/2021   Chronic renal failure 10/13/2021    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[place]) 09/24/2021   Aphasia due to acute cerebrovascular accident (CVA) ([place]) 09/21/2021   Chronic kidney disease, stage 3b ([place]) 09/21/2021   Depression with anxiety 09/21/2021   Hypertension 09/21/2021   Asthma, chronic 09/21/2021   Fall at home, initial encounter 09/21/2021   Right ankle pain 09/21/2021   Dysuria 08/03/2019   Chronic cough 12/20/2018   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   Status post total knee replacement, left 08/26/2018   Essential hypertension 02/23/2017   Adenocarcinoma of lung, stage 1 ([place]) 05/08/2014   S/P lobectomy of lung 02/12/2014   Asthma 01/30/2014    Multiple pulmonary nodules, largest R mid lung 06/29/2013   Cough variant asthma with component of uacs  05/31/2013    ONSET DATE: March 2023, referral July 2023   REFERRING DIAG:  I69.320 (ICD-10-CM) - Aphasia as late effect of cerebrovascular accident (CVA)  I63.512 (ICD-10-CM) - Acute ischemic left MCA stroke ([facility])    THERAPY DIAG:  Apraxia  Cognitive communication deficit  Aphasia  Rationale for Evaluation and Treatment Rehabilitation  SUBJECTIVE:   SUBJECTIVE STATEMENT: Pt attends session with daughter.  PAIN:  Are you having pain? No   OBJECTIVE:  TODAY'S TREATMENT:  03-24-22: Target strengthening orthographic representation of phonemic targets to enable improved ability to write. SLP initiated Copy and Recall Treatment (CART) with set of 20 words to facilitate key word usage for future phonological to orthographic correspondence in self-generated written words. Pt was able to name 6/6 words without cueing. Pt was able to write 20% of pictured words independently at baseline. The patient was able to copy a written word with 76% accuracy, independently. With occasional min-A  from SLP, this improved to 100% accuracy. Self-ID of errors in approximately 50% of trials. With written visual cues removed, the patient was able to  recall and write the target words with 85% accuracy on initial attempt. Corrects errors with min verbal cues. Update HEP.   03-20-22: Target word finding through divergent naming of abstract categories. Pt generates x3 items in x10 categories with supervision-A provided with usual extended time. Targeted word finding, verbal apraxia, sentence generation with Pharmacist, hospital ([facility]). For the verbs bake, drive, fix, the patient generated 3 subject and objects for each verb, a total of 9 subjects and 9 objects with usual verbal cues required following independent generation of initial subject/verb. Pt generated complex sentences for each verb, by answering "wh" questions with usual verbal cues, for a total of 6 complex sentences. Following, pt able to relay last time friend fixed something in her home, demonstrating usual anomia and halting speech. Verbal cues to describe intermittently successful. Discussion with daughter at conclusion of  session, highlighting some concerns: sequencing of daily tasks, cell phone usage, short term memory. Will consider for re-recert.   03-18-22:Target verb ID and use at sentence level in picture description task. Given photo, pt able to generate simple sentence with appropriate syntax in 14/15. Generates complex sentence providing additional information in 12/15 trials. Occasional min-A, use of questioning cues to aid in completion of task, x1 cue for anomia strategy use of cueing with first letter. Pt reports upcoming beach trip. Use topic in discourse level task, targeting word finding and cohesiveness at discourse level. Pt generates details, given framework for narrative featuring beginning, middle, end. Pt wih usual vague language, agrammatical. Generate script for practice, then attempt discourse again with improved cohesiveness demonstrated. Details provided increased from x3 to x11 following script practice. Pt able to teach back anomia strategy of  self-cueing for initial letter to aid in word finding.    PATIENT EDUCATION: Education details: see above Person educated: Patient Education method: Customer service manager Education comprehension: verbalized understanding, returned demonstration, and needs further education   GOALS: Goals reviewed with patient? Yes  SHORT TERM GOALS: Target date: 03/03/22  Pt will verbalize 7 items in personally relevant categories with occasional min-A over 2 therapy sessions Baseline: 02-24-22; 02/26/22 Goal status: MET  2.  Pt will correct semantic paraphasia given usual mod-A in 70% of opportunities during structured task over 2 sessions.  Baseline: 02-24-22; 02-26-22 Goal status: MET  3.  Pt will write x5 item list with 80% accuracy, given usual mod-A over 2 sessions.  Baseline: 02-24-22; 02-26-22 Goal status: NOT MET  4.  Pt will successfully use trained anomia compensation in x3 opportunities across 2 therapy sessions with occasioanl min-A.  Baseline: 02-24-22; 02-26-22 Goal status: PARTIALLY MET   LONG TERM GOALS: Target date: 03/31/2022  Pt will identify, and correct, 50% of semantic paraphasias during structured task with occasional min-A over 2 sessions.  Baseline:  Goal status: IN PROGRESS  2.  Pt will report improvement of 2 points via CPIB PROM by d/c Baseline: 15 Goal status: IN PROGRESS  3.  Pt will carryover dysnomia compensations to conversational speech, resulting in strategy use in 70% of opportunities over 15 minute conversation Baseline:  Goal status: IN PROGRESS  4.  Pt will write x10 phrases, with mod-I, with 80% accuracy over 2 sessions.   Baseline:  Goal status: IN PROGRESS  ASSESSMENT:  CLINICAL IMPRESSION: Patient is a 81 y.o. F presenting with mild-moderate expressive aphasia. Receptive language skills appear to be within gross normal limits. Spontaneous speech is characterized by usual vague language, anomia and frequent paraphasias impacting clarity and  cohesiveness of message. Emerging awareness of paraphasias exhibited. Written expression impaired, with SLP initiating skilled interventions and strategies to aid in abilites for desired functional tasks at home such as grocery list creation. SLP has initiated structured language tasks to aid in improved expressive language and enhanced communication confidence and efficacy. I recommend continued skilled ST to address expressive aphasia.  OBJECTIVE IMPAIRMENTS include expressive language, aphasia, and apraxia. These impairments are limiting patient from effectively communicating at home and in community. Factors affecting potential to achieve goals and functional outcome are family/community support. Patient will benefit from skilled SLP services to address above impairments and improve overall function.  REHAB POTENTIAL: Good  PLAN: SLP FREQUENCY: 2x/week  SLP DURATION: 8 weeks  PLANNED INTERVENTIONS: Language facilitation, Cueing hierachy, Internal/external aids, Functional tasks, Multimodal communication approach, SLP instruction and feedback, Compensatory strategies, and Patient/family education    [place]  Arvid Right, [place] 03/24/2022, 12:34 PM

## 2022-03-25 ENCOUNTER — Ambulatory Visit: Payer: Medicare Other | Admitting: Physical Therapy

## 2022-03-25 ENCOUNTER — Ambulatory Visit: Payer: Medicare Other | Admitting: Occupational Therapy

## 2022-03-25 DIAGNOSIS — M6281 Muscle weakness (generalized): Secondary | ICD-10-CM | POA: Diagnosis not present

## 2022-03-25 DIAGNOSIS — R4701 Aphasia: Secondary | ICD-10-CM | POA: Diagnosis not present

## 2022-03-25 DIAGNOSIS — R2689 Other abnormalities of gait and mobility: Secondary | ICD-10-CM | POA: Diagnosis not present

## 2022-03-25 DIAGNOSIS — Z9181 History of falling: Secondary | ICD-10-CM

## 2022-03-25 DIAGNOSIS — R278 Other lack of coordination: Secondary | ICD-10-CM

## 2022-03-25 DIAGNOSIS — I69351 Hemiplegia and hemiparesis following cerebral infarction affecting right dominant side: Secondary | ICD-10-CM

## 2022-03-25 DIAGNOSIS — R482 Apraxia: Secondary | ICD-10-CM | POA: Diagnosis not present

## 2022-03-25 NOTE — Therapy (Signed)
OUTPATIENT OCCUPATIONAL THERAPY NEURO TREATMENT  Patient Name: [name] MRN: [id number] DOB:06/25/1941, 81 y.o., female Today's Date: 03/25/2022  PCP: NA REFERRING PROVIDER: Courtney Heys, MD    OT End of Session - 03/25/22 1315     Visit Number 5    Number of Visits 9    Authorization Type U[employer] Medicare    Authorization - Number of Visits 5    Progress Note Due on Visit 10    OT Start Time 1315    OT Stop Time 1400    OT Time Calculation (min) 45 min    Activity Tolerance Patient tolerated treatment well    Behavior During Therapy WFL for tasks assessed/performed               Past Medical History:  Diagnosis Date    Multiple pulmonary nodules, largest R mid lung 06/29/2013   Followed in Pulmonary clinic/ [facility]/ [place] - see CXR 06/27/13  - CT chest 07/14/2013 > 1. No acute findings in the thorax to account for the patient's  symptoms.  2. However, there is a subsolid nodule in the   right lower lobe that has a ground-glass attenuation component  measuring 2.5 x 1.8 cm, and a small central solid component  measuring 4 mm. Initial follow-up by chest CT without    Adenocarcinoma of lung, stage 1 ([place])    Status post right lower lobectomy August 2015   Anxiety    Asthma    Atrophic vaginitis    Chronic cough 12/20/2018   Chronic iritis of left eye    Pupil stays dilated; nonreactive   Chronic kidney disease, stage 3b (HCC)    Colon polyp    Contact lens/glasses fitting    wears contacts or glasses   Cough variant asthma with component of uacs  05/31/2013   Followed in Pulmonary clinic/ [facility]/ [place] in her 10's - Spirometry 02/23/13 wnl  - 07/03/2013 Sinus CT > Mild chronic sinus disease - No acute findings. Left-to-right nasal septal deviation of 3 mm. -med calendar 08/08/13 , redone 06/01/2016 and 02/22/2017  - Eos 4%   10/13/2013  > rec singulair daily  - FENO 05/12/2016  =   24 on singulair  - Allergy profile 05/12/2016 >   IgE  47 pos    Depression    Depression with anxiety    Dysuria 08/03/2019   Environmental allergies    Essential hypertension 02/23/2017   Changed losartan to avapro 02/22/2017 due to cough    GERD (gastroesophageal reflux disease)    Hypertension    Hypertriglyceridemia    Kidney stones    OA (osteoarthritis)    Osteopenia    PONV (postoperative nausea and vomiting)    S/P lobectomy of lung 02/12/2014   Status post total knee replacement, left 08/26/2018   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   Past Surgical History:  Procedure Laterality Date   APPENDECTOMY     COLONOSCOPY     EYE SURGERY Left    cataract removal   ORIF WRIST FRACTURE Left 08/04/2013   Procedure: OPEN REDUCTION INTERNAL FIXATION (ORIF) LEFT DISTAL RADIUS WRIST FRACTURE;  Surgeon: Wynonia Sours, MD;  Location: [facility];  Service: Orthopedics;  Laterality: Left;   PARTIAL HYSTERECTOMY     TOTAL KNEE ARTHROPLASTY Left 08/26/2018   Procedure: TOTAL KNEE ARTHROPLASTY;  Surgeon: Netta Cedars, MD;  Location[facility];  Service: Orthopedics;  Laterality: Left;   VIDEO ASSISTED THORACOSCOPY (VATS)/WEDGE RESECTION Right  02/12/2014   Procedure: RIGHT VIDEO ASSISTED THORACOSCOPY RIGHT LOWER LOBE LUNG /WEDGE RESECTION,RIGHT THORACOTOMY WITH RIGHT LOWER LOBE LOBECTOMY & NODE DISSECTION;  Surgeon: Melrose Nakayama, MD;  Location: [facility];  Service: Thoracic;  Laterality: Right;   VIDEO ASSISTED THORACOSCOPY (VATS)/WEDGE RESECTION Right 02/12/2014   VIDEO BRONCHOSCOPY N/A 02/12/2014   Procedure: VIDEO BRONCHOSCOPY;  Surgeon: Melrose Nakayama, MD;  Location: [facility];  Service: Thoracic;  Laterality: N/A;   Patient Active Problem List   Diagnosis Date Noted   Acute ischemic left MCA stroke ([place]) 01/28/2022   Aphasia as late effect of cerebrovascular accident (CVA) 01/28/2022   Right hemiparesis ([place]) 01/28/2022   Abnormal gait due to muscle weakness 01/28/2022   Insomnia 10/13/2021   Frequency of urination 10/13/2021    Vitamin D deficiency 10/13/2021   Chronic renal failure 10/13/2021   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[place]) 09/24/2021   Aphasia due to acute cerebrovascular accident (CVA) ([place]) 09/21/2021   Chronic kidney disease, stage 3b ([place]) 09/21/2021   Depression with anxiety 09/21/2021   Hypertension 09/21/2021   Asthma, chronic 09/21/2021   Fall at home, initial encounter 09/21/2021   Right ankle pain 09/21/2021   Dysuria 08/03/2019   Chronic cough 12/20/2018   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   Status post total knee replacement, left 08/26/2018   Essential hypertension 02/23/2017   Adenocarcinoma of lung, stage 1 ([place]) 05/08/2014   S/P lobectomy of lung 02/12/2014   Asthma 01/30/2014    Multiple pulmonary nodules, largest R mid lung 06/29/2013   Cough variant asthma with component of uacs  05/31/2013    ONSET DATE: 01/28/2022   REFERRING DIAG: G81.91 (ICD-10-CM) - Right hemiparesis (HCC) I63.512 (ICD-10-CM) - Acute ischemic left MCA stroke (HCC)   THERAPY DIAG:  Hemiplegia and hemiparesis following cerebral infarction affecting right dominant side (HCC)  Rationale for Evaluation and Treatment Rehabilitation  SUBJECTIVE:   SUBJECTIVE STATEMENT: No new falls   Pt accompanied by: self  PERTINENT HISTORY:  hx of hypertension, thrombotic stroke involving left middle cerebral artery, asthma, lung cancer, coronary calcification seen on CT and CKD    PRECAUTIONS: Fall  WEIGHT BEARING RESTRICTIONS No  PAIN:  Are you having pain? NO  FALLS: Has patient fallen in last 6 months? Yes [place] last week.  Fell backward, could not get up without help  LIVING ENVIRONMENT: Lives with: lives alone with aide/caregiver Mon-Fri Lives in: House/apartment (one level home w/ 1 step to enter)   Has following equipment at home: Environmental consultant - 4 wheeled, Shower bench, and Grab bars  PLOF: Independent with basic ADLs  PATIENT GOALS I want to be more  independent  OBJECTIVE:   Today's treatment:  Reviewed recommendations for activities to do at home, and theraband HEP extensively - pt still required mod to max cueing for theraband HEP d/t cognitive deficits  Sidestepping along counter w/o UE support bilaterally x 2 each side w/ cues not to drag RT foot when going to Lt side. Retrieving cones from shelf and replacing BUE's w/ mod cues to alternate arms.  Practiced bending over to place and pick up cones off floor w/ one hand countertop support, going both ways x 2 each way.   Arm bike x 6 min,  level 2 for reciprocal movement/conditioning without rest.    PATIENT EDUCATION: Education details: UE yellow theraband HEP; Functional Home Activity Program to incr IADL participation (with supervision)--see pt instructions Person educated: Patient Education method: Explanation, Demonstration, Verbal cues, and Handouts Education  comprehension: verbalized understanding and returned demonstration   HOME EXERCISE PROGRAM: 03/16/22:  UE yellow theraband HEP; Functional Home Activity Program to incr IADL participation (with supervision)--see pt instructions    GOALS: Goals reviewed with patient? Yes  SHORT TERM GOALS: Target date: 03/08/22  Patient will complete a home activity program designed to decrease level of care provided by paid caregivers Baseline: Goal status: IN PROGRESS   2.  Patient will transfer into / out of shower with distant supervision Baseline:  Goal status: IN PROGRESS  3.  Patient will wash her back with AE without physical assistance Baseline:  Goal status: IN PROGRESS     LONG TERM GOALS: Target date: 04/07/22  Patient will reduce at least 6 hours total of paid caregiver time Baseline:  Goal status: INITIAL  2.  Patient will demonstrate awareness of return to driving recommendations Baseline:  Goal status: IN PROGRESS  3.  Patient will dress herself with modified independence including right ankle  brace Baseline:  Goal status: INITIAL  4.  Patient will demonstrate improved stand balance as evidenced by standing without UE support during functional activities.   Baseline:  Goal status: IN PROGRESS   ASSESSMENT:  CLINICAL IMPRESSION: Pt progressing slowly towards goals.  Pt very motivated to incr independence.  Pt would benefit from continued practice and focus on safety strategies for simulated IADLs. Pt limited by cognitive deficits  PERFORMANCE DEFICITS in functional skills including coordination, dexterity, proprioception, sensation, tone, ROM, strength, flexibility, FMC, GMC, and UE functional use,   IMPAIRMENTS are limiting patient from ADLs and IADLs.   COMORBIDITIES may have co-morbidities  that affects occupational performance. Patient will benefit from skilled OT to address above impairments and improve overall function.  MODIFICATION OR ASSISTANCE TO COMPLETE EVALUATION: Min-Moderate modification of tasks or assist with assess necessary to complete an evaluation.  OT OCCUPATIONAL PROFILE AND HISTORY: Detailed assessment: Review of records and additional review of physical, cognitive, psychosocial history related to current functional performance.  CLINICAL DECISION MAKING: Moderate - several treatment options, min-mod task modification necessary  REHAB POTENTIAL: Good  EVALUATION COMPLEXITY: Moderate   PLAN: OT FREQUENCY: 1x/week  OT DURATION: 8 weeks  PLANNED INTERVENTIONS: self care/ADL training, therapeutic exercise, therapeutic activity, neuromuscular re-education, balance training, functional mobility training, aquatic therapy, patient/family education, visual/perceptual remediation/compensation, and DME and/or AE instructions  RECOMMENDED OTHER SERVICES: NA  CONSULTED AND AGREED WITH PLAN OF CARE: Patient  PLAN FOR NEXT SESSION: practice IADLs, continue standing balance without UE support, review HEP again    Hans Eden, OTR/L 03/25/2022, 1:16  PM

## 2022-03-25 NOTE — Therapy (Signed)
OUTPATIENT PHYSICAL THERAPY NEURO THERAPY   Patient Name: [name] MRN: [id number] DOB:Nov 30, 1940, 81 y.o., female Today's Date: 03/25/2022   PCP: Pt unable to provide information and unable to locate in chart. REFERRING PROVIDER: Courtney Heys, MD      PT End of Session - 03/25/22 1234     Visit Number 12    Number of Visits 17    Date for PT Re-Evaluation 03/31/22    Authorization Type UHC Medicare    Progress Note Due on Visit 10    PT Start Time 1232    PT Stop Time 1314    PT Time Calculation (min) 42 min    Equipment Utilized During Treatment Gait belt    Activity Tolerance Patient tolerated treatment well    Behavior During Therapy WFL for tasks assessed/performed                   Past Medical History:  Diagnosis Date    Multiple pulmonary nodules, largest R mid lung 06/29/2013   Followed in Pulmonary clinic/ [facility]/ [place] - see CXR 06/27/13  - CT chest 07/14/2013 > 1. No acute findings in the thorax to account for the patient's  symptoms.  2. However, there is a subsolid nodule in the   right lower lobe that has a ground-glass attenuation component  measuring 2.5 x 1.8 cm, and a small central solid component  measuring 4 mm. Initial follow-up by chest CT without    Adenocarcinoma of lung, stage 1 ([place])    Status post right lower lobectomy August 2015   Anxiety    Asthma    Atrophic vaginitis    Chronic cough 12/20/2018   Chronic iritis of left eye    Pupil stays dilated; nonreactive   Chronic kidney disease, stage 3b (HCC)    Colon polyp    Contact lens/glasses fitting    wears contacts or glasses   Cough variant asthma with component of uacs  05/31/2013   Followed in Pulmonary clinic/ [facility]/ [place] [facility] in her 80's - Spirometry 02/23/13 wnl  - 07/03/2013 Sinus CT > Mild chronic sinus disease - No acute findings. Left-to-right nasal septal deviation of 3 mm. -med calendar 08/08/13 , redone 06/01/2016 and 02/22/2017  - Eos 4%    10/13/2013  > rec singulair daily  - FENO 05/12/2016  =   24 on singulair  - Allergy profile 05/12/2016 >   IgE  47 pos   Depression    Depression with anxiety    Dysuria 08/03/2019   Environmental allergies    Essential hypertension 02/23/2017   Changed losartan to avapro 02/22/2017 due to cough    GERD (gastroesophageal reflux disease)    Hypertension    Hypertriglyceridemia    Kidney stones    OA (osteoarthritis)    Osteopenia    PONV (postoperative nausea and vomiting)    S/P lobectomy of lung 02/12/2014   Status post total knee replacement, left 08/26/2018   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   Past Surgical History:  Procedure Laterality Date   APPENDECTOMY     COLONOSCOPY     EYE SURGERY Left    cataract removal   ORIF WRIST FRACTURE Left 08/04/2013   Procedure: OPEN REDUCTION INTERNAL FIXATION (ORIF) LEFT DISTAL RADIUS WRIST FRACTURE;  Surgeon: Wynonia Sours, MD;  Location: [facility];  Service: Orthopedics;  Laterality: Left;   PARTIAL HYSTERECTOMY     TOTAL KNEE ARTHROPLASTY Left 08/26/2018   Procedure:  TOTAL KNEE ARTHROPLASTY;  Surgeon: Netta Cedars, MD;  [facility];  Service: Orthopedics;  Laterality: Left;   VIDEO ASSISTED THORACOSCOPY (VATS)/WEDGE RESECTION Right 02/12/2014   Procedure: RIGHT VIDEO ASSISTED THORACOSCOPY RIGHT LOWER LOBE LUNG /WEDGE RESECTION,RIGHT THORACOTOMY WITH RIGHT LOWER LOBE LOBECTOMY & NODE DISSECTION;  Surgeon: Melrose Nakayama, MD;  Location: [facility];  Service: Thoracic;  Laterality: Right;   VIDEO ASSISTED THORACOSCOPY (VATS)/WEDGE RESECTION Right 02/12/2014   VIDEO BRONCHOSCOPY N/A 02/12/2014   Procedure: VIDEO BRONCHOSCOPY;  Surgeon: Melrose Nakayama, MD;  Location: [facility];  Service: Thoracic;  Laterality: N/A;   Patient Active Problem List   Diagnosis Date Noted   Acute ischemic left MCA stroke ([place]) 01/28/2022   Aphasia as late effect of cerebrovascular accident (CVA) 01/28/2022   Right hemiparesis ([place]) 01/28/2022    Abnormal gait due to muscle weakness 01/28/2022   Insomnia 10/13/2021   Frequency of urination 10/13/2021   Vitamin D deficiency 10/13/2021   Chronic renal failure 10/13/2021   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[place]) 09/24/2021   Aphasia due to acute cerebrovascular accident (CVA) ([place]) 09/21/2021   Chronic kidney disease, stage 3b ([place]) 09/21/2021   Depression with anxiety 09/21/2021   Hypertension 09/21/2021   Asthma, chronic 09/21/2021   Fall at home, initial encounter 09/21/2021   Right ankle pain 09/21/2021   Dysuria 08/03/2019   Chronic cough 12/20/2018   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   Status post total knee replacement, left 08/26/2018   Essential hypertension 02/23/2017   Adenocarcinoma of lung, stage 1 ([place]) 05/08/2014   S/P lobectomy of lung 02/12/2014   Asthma 01/30/2014    Multiple pulmonary nodules, largest R mid lung 06/29/2013   Cough variant asthma with component of uacs  05/31/2013    ONSET DATE: 01/28/2022   REFERRING DIAG: G81.91 (ICD-10-CM) - Right hemiparesis (HCC) I63.512 (ICD-10-CM) - Acute ischemic left MCA stroke (HCC)   THERAPY DIAG:  Other abnormalities of gait and mobility  Other lack of coordination  History of falling  Rationale for Evaluation and Treatment Rehabilitation  SUBJECTIVE:                                                                                                                                                                                             SUBJECTIVE STATEMENT: Pt reports things are about the same. No new falls or near misses.   Pt accompanied by: self   PERTINENT HISTORY: hx of hypertension, thrombotic stroke involving left middle cerebral artery, asthma, lung cancer, coronary calcification seen on CT and CKD   PAIN:  Are you having pain? No  PRECAUTIONS:  Fall  PATIENT GOALS "to be able to walk without help and without pain"  OBJECTIVE:   TODAY'S TREATMENT: NMR   Ladder drills for improved BLE coordination, improved step length/clearance and confidence w/ambulation without an AD: -Fwd 1 foot per square w/CGA, down and back x2 -Lateral stepping 2 feet per square, down and back x2 w/short seated rest break taken in between due to pt fatigue. Min cues to maintain side stepping throughout, as pt tending to rotate to R side and navigate ladder forwards. Min A throughout for anterior LOB correction, more so when stepping to R side  -2 steps fwd, 1 retro step down ladder and back x1 w/CGA and max verbal cues for proper sequence. Pt demonstrated significant difficulty performing task and frequently regressed to only fwd or only retro stepping, requiring max cues to maintain sequence. Pt reported feeling "off balance" throughout, but no instability noted. CGA for safety   In // bars for reaching out of BOS, dynamic standing balance and BUE coordination: -Standing on [employer] playing cornhole without UE support, x2 games. First game pt reached to basket on R side and toss w/RUE. Second game, pt played on L side. Noted good use of ankle/hip strategy throughout to maintain balance. CGA for safety.   Gait pattern: step through pattern, decreased arm swing- Right, decreased arm swing- Left, decreased step length- Right, decreased step length- Left, decreased stride length, lateral lean- Right, lateral lean- Left, and wide BOS Distance walked: 115' Assistive device utilized:  Geologist, engineering and None Level of assistance: SBA Comments: Had pt walk around clinic holding bean bag basket and searching for beanbags that therapist hid around gym to work on dynamic gait, reaching out of BOS and visual scanning. Pt performed task well without LOB and was able to locate beanbags w/100% accuracy.   Gait pattern:  Maintained R elbow flexion, step through pattern, decreased step length- Right, decreased step length- Left, decreased stride length, and wide BOS Distance walked:  115' Assistive device utilized:  [employer] (15#) Level of assistance: SBA Comments: Practiced ambulating w/water-filled surge for improve dynamic gait stability and confidence w/gait without AD. Pt had significant difficulty holding surge due to weight and maintained R elbow flexion throughout despite cues. No instability noted    PATIENT EDUCATION: Education details: continue HEP Person educated: Patient Education method: Explanation, Demonstration, and Handouts Education comprehension: verbalized understanding   HOME EXERCISE PROGRAM: Access Code: [email or web address] URL: [email or web address] Date: 02/10/2022 Prepared by: Excell Seltzer  Exercises - Side Stepping with Resistance at Ankles and Counter Support  - 1 x daily - 7 x weekly - 3 sets - 10 reps - Forward Step Up with Counter Support  - 1 x daily - 7 x weekly - 3 sets - 10 reps - Towel Scrunches  - 1 x daily - 7 x weekly - 2 sets - 10 reps - Seated Ankle Alphabet  - 1 x daily - 7 x weekly - 1 sets - 10 reps - Mini Squat with Counter Support  - 1 x daily - 7 x weekly - 3 sets - 10 reps   GOALS: Goals reviewed with patient? Yes  SHORT TERM GOALS: Target date: 03/03/2022  Initiate HEP Baseline: initiated 8/8 Goal status: MET  2.  Pt will improve gait velocity to at least 2.5 ft/sec for improved gait efficiency and performance at Supervision level with LRAD Baseline: 2.26 ft/sec S* with RW (8/1), 2.35 ft/sec (8/24) Goal status: IN PROGRESS  3.  Pt will improve  normal TUG to less than or equal to 13 seconds for improved functional mobility and decreased fall risk. Baseline: 14 sec with RW (8/1), 11.91 sec with no AD (8/24) Goal status: MET  4.  Pt will improve Berg score to 40/56  for decreased fall risk Baseline: 34/56 (8/8); 37/56 on 9/6 Goal status: NOT MET  5.  Pt will improve 5 x STS to less than or equal to 12 seconds to demonstrate improved functional strength and transfer efficiency.  Baseline:  13.8 (8/1), 15.28 sec (8/24) Goal status: IN PROGRESS   LONG TERM GOALS: Target date: 03/31/2022  Pt will be independent with balance and strengthening HEP for improved strength, balance, transfers and gait. Baseline:  Goal status: INITIAL  2.  Pt will improve gait velocity to at least 3.0 ft/sec for improved gait efficiency and performance at mod I level with LRAD Baseline: 2.26 ft/sec S* with RW (8/1), 2.35 ft/sec with RW (8/24) Goal status: REVISED  3.  Pt will improve normal TUG to less than or equal to 12 seconds for improved functional mobility and decreased fall risk. Baseline: 14 sec with RW (8/1), 11.91 sec with no AD (8/24) Goal status: INITIAL  4.  Pt will improve Berg score to 46/56 for decreased fall risk Baseline: 34/56 (8/8) Goal status: INITIAL  5.  Pt will improve 5 x STS to less than or equal to 10 seconds to demonstrate improved functional strength and transfer efficiency.  Baseline: 13.8 sec (8/1), 15.28 sec (8/24) Goal status: INITIAL   ASSESSMENT:  CLINICAL IMPRESSION: Emphasis of skilled PT session on BLE coordination, dynamic gait stability, and reaching out of BOS. Pt continues to be limited by poor motor planning ability, requiring mod-max verbal cues to maintain proper sequencing of task to reduce risk of falling. Pt tolerated session well without use of rollator and no instances of imbalance. Continue POC.    OBJECTIVE IMPAIRMENTS Abnormal gait, decreased balance, decreased coordination, decreased mobility, decreased strength, decreased safety awareness, and pain.   ACTIVITY LIMITATIONS carrying, lifting, bending, squatting, stairs, transfers, bathing, dressing, and hygiene/grooming  PARTICIPATION LIMITATIONS: meal prep, cleaning, laundry, medication management, driving, shopping, community activity, yard work, and school  PERSONAL FACTORS hx of hypertension, thrombotic stroke involving left middle cerebral artery, asthma, lung cancer, coronary  calcification seen on CT and CKD  are also affecting patient's functional outcome.   REHAB POTENTIAL: Good  CLINICAL DECISION MAKING: Stable/uncomplicated  EVALUATION COMPLEXITY: Moderate  PLAN: PT FREQUENCY: 2x/week  PT DURATION: 8 weeks  PLANNED INTERVENTIONS: Therapeutic exercises, Therapeutic activity, Neuromuscular re-education, Balance training, Gait training, Patient/Family education, Self Care, Joint mobilization, Stair training, DME instructions, Electrical stimulation, Cryotherapy, Moist heat, Taping, Manual therapy, and Re-evaluation  PLAN FOR NEXT SESSION:  Assess goals and recert vs DC (sorry I did not see she didn't have her next appointment until October). add to HEP for balance and strengthening, obstacle course, LE coordination, tandem gait, backwards gait, keep working on gait with no AD (carrying stuff with no UE support, uneven surface)   Raman Featherston E Haydn Cush, PT, DPT 03/25/2022, 1:21 PM

## 2022-03-31 ENCOUNTER — Ambulatory Visit: Payer: Medicare Other | Admitting: Thoracic Surgery (Cardiothoracic Vascular Surgery)

## 2022-03-31 ENCOUNTER — Other Ambulatory Visit: Payer: Medicare Other

## 2022-04-02 ENCOUNTER — Encounter: Payer: Medicare Other | Admitting: Occupational Therapy

## 2022-04-06 NOTE — Therapy (Signed)
OUTPATIENT OCCUPATIONAL THERAPY NEURO TREATMENT  Patient Name: [name] MRN: [id number] DOB:1940/10/01, 81 y.o., female Today's Date: 04/07/2022  PCP: NA REFERRING PROVIDER: Courtney Heys, MD    OT End of Session - 04/07/22 1328     Visit Number 6    Number of Visits 9    Authorization Type [employer]    Authorization - Number of Visits 6    Progress Note Due on Visit 10    OT Start Time 1324    OT Stop Time 1403    OT Time Calculation (min) 39 min    Activity Tolerance Patient tolerated treatment well    Behavior During Therapy WFL for tasks assessed/performed                Past Medical History:  Diagnosis Date    Multiple pulmonary nodules, largest R mid lung 06/29/2013   Followed in Pulmonary clinic/ [facility]/ [place] - see CXR 06/27/13  - CT chest 07/14/2013 > 1. No acute findings in the thorax to account for the patient's  symptoms.  2. However, there is a subsolid nodule in the   right lower lobe that has a ground-glass attenuation component  measuring 2.5 x 1.8 cm, and a small central solid component  measuring 4 mm. Initial follow-up by chest CT without    Adenocarcinoma of lung, stage 1 ([place])    Status post right lower lobectomy August 2015   Anxiety    Asthma    Atrophic vaginitis    Chronic cough 12/20/2018   Chronic iritis of left eye    Pupil stays dilated; nonreactive   Chronic kidney disease, stage 3b (HCC)    Colon polyp    Contact lens/glasses fitting    wears contacts or glasses   Cough variant asthma with component of uacs  05/31/2013   Followed in [facility]/ [place] [facility] [place] [facility] in her 42's - Spirometry 02/23/13 wnl  - 07/03/2013 Sinus CT > Mild chronic sinus disease - No acute findings. Left-to-right nasal septal deviation of 3 mm. -med calendar 08/08/13 , redone 06/01/2016 and 02/22/2017  - Eos 4%   10/13/2013  > rec singulair daily  - FENO 05/12/2016  =   24 on singulair  - Allergy profile 05/12/2016 >   IgE  47 pos    Depression    Depression with anxiety    Dysuria 08/03/2019   Environmental allergies    Essential hypertension 02/23/2017   Changed losartan to avapro 02/22/2017 due to cough    GERD (gastroesophageal reflux disease)    Hypertension    Hypertriglyceridemia    Kidney stones    OA (osteoarthritis)    Osteopenia    PONV (postoperative nausea and vomiting)    S/P lobectomy of lung 02/12/2014   Status post total knee replacement, left 08/26/2018   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   Past Surgical History:  Procedure Laterality Date   APPENDECTOMY     COLONOSCOPY     EYE SURGERY Left    cataract removal   ORIF WRIST FRACTURE Left 08/04/2013   Procedure: OPEN REDUCTION INTERNAL FIXATION (ORIF) LEFT DISTAL RADIUS WRIST FRACTURE;  Surgeon: Wynonia Sours, MD;  Location: [facility];  Service: Orthopedics;  Laterality: Left;   PARTIAL HYSTERECTOMY     TOTAL KNEE ARTHROPLASTY Left 08/26/2018   Procedure: TOTAL KNEE ARTHROPLASTY;  Surgeon: Netta Cedars, MD;  Location: [facility];  Service: Orthopedics;  Laterality: Left;   VIDEO ASSISTED THORACOSCOPY (VATS)/WEDGE RESECTION  Right 02/12/2014   Procedure: RIGHT VIDEO ASSISTED THORACOSCOPY RIGHT LOWER LOBE LUNG /WEDGE RESECTION,RIGHT THORACOTOMY WITH RIGHT LOWER LOBE LOBECTOMY & NODE DISSECTION;  Surgeon: Melrose Nakayama, MD;  Location: [facility];  Service: Thoracic;  Laterality: Right;   VIDEO ASSISTED THORACOSCOPY (VATS)/WEDGE RESECTION Right 02/12/2014   VIDEO BRONCHOSCOPY N/A 02/12/2014   Procedure: VIDEO BRONCHOSCOPY;  Surgeon: Melrose Nakayama, MD;  Location: [facility];  Service: Thoracic;  Laterality: N/A;   Patient Active Problem List   Diagnosis Date Noted   Acute ischemic left MCA stroke ([place]) 01/28/2022   Aphasia as late effect of cerebrovascular accident (CVA) 01/28/2022   Right hemiparesis ([place]) 01/28/2022   Abnormal gait due to muscle weakness 01/28/2022   Insomnia 10/13/2021   Frequency of urination 10/13/2021    Vitamin D deficiency 10/13/2021   Chronic renal failure 10/13/2021   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[place]) 09/24/2021   Aphasia due to acute cerebrovascular accident (CVA) ([place]) 09/21/2021   Chronic kidney disease, stage 3b ([place]) 09/21/2021   Depression with anxiety 09/21/2021   Hypertension 09/21/2021   Asthma, chronic 09/21/2021   Fall at home, initial encounter 09/21/2021   Right ankle pain 09/21/2021   Dysuria 08/03/2019   Chronic cough 12/20/2018   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   Status post total knee replacement, left 08/26/2018   Essential hypertension 02/23/2017   Adenocarcinoma of lung, stage 1 ([place]) 05/08/2014   S/P lobectomy of lung 02/12/2014   Asthma 01/30/2014    Multiple pulmonary nodules, largest R mid lung 06/29/2013   Cough variant asthma with component of uacs  05/31/2013    ONSET DATE: 01/28/2022   REFERRING DIAG: G81.91 (ICD-10-CM) - Right hemiparesis (HCC) I63.512 (ICD-10-CM) - Acute ischemic left MCA stroke (HCC)   THERAPY DIAG:  Hemiplegia and hemiparesis following cerebral infarction affecting right dominant side (HCC)  Other lack of coordination  Apraxia  Rationale for Evaluation and Treatment Rehabilitation  SUBJECTIVE:   SUBJECTIVE STATEMENT: Pt reports that she went to the beach last week with an out of town friend    Pt accompanied by: self  PERTINENT HISTORY:  hx of hypertension, thrombotic stroke involving left middle cerebral artery, asthma, lung cancer, coronary calcification seen on CT and CKD    PRECAUTIONS: Fall  WEIGHT BEARING RESTRICTIONS No  PAIN:  Are you having pain? NO  FALLS: Has patient fallen in last 6 months? Yes [place] last week.  Fell backward, could not get up without help  LIVING ENVIRONMENT: Lives with: lives alone with aide/caregiver Mon-Fri Lives in: House/apartment (one level home w/ 1 step to enter)   Has following equipment at home: Environmental consultant - 4 wheeled, Shower bench, and  Grab bars  PLOF: Independent with basic ADLs  PATIENT GOALS I want to be more independent  OBJECTIVE:   Today's treatment:  Standing, functional step and reach along counter to reach outside base of support to each side for incorporating lateral wt. Shift and trunk rotation (to simulated IADLs and for improved balance) with mod cueing for sequencing of complex movements and following directions due to cognitive deficits.  Pt improved with cueing to step to floor dots for visual cueing and repetition, but needed CGA for balance.     Standing, folding clothes with no LOB and incr time for coordination.  Pt fatigued quickly and needed rest breaks by end of session.   PATIENT EDUCATION: Education details: Reviewed UE yellow theraband HEP--pt performed with min-mod cueing/demo; Functional Home Activity Program to incr IADL  participation (with supervision) Person educated: Patient Education method: Explanation, Demonstration, Verbal cues, and Handouts Education comprehension: verbalized understanding and returned demonstration   HOME EXERCISE PROGRAM: 03/16/22:  UE yellow theraband HEP; Functional Home Activity Program to incr IADL participation (with supervision)--see pt instructions    GOALS: Goals reviewed with patient? Yes  SHORT TERM GOALS: Target date: 03/08/22  Patient will complete a home activity program designed to decrease level of care provided by paid caregivers Baseline: Goal status: IN PROGRESS   2.  Patient will transfer into / out of shower with distant supervision Baseline:  Goal status: IN PROGRESS  3.  Patient will wash her back with AE without physical assistance Baseline:  Goal status: IN PROGRESS     LONG TERM GOALS: Target date: 04/07/22  Patient will reduce at least 6 hours total of paid caregiver time Baseline:  Goal status: INITIAL  2.  Patient will demonstrate awareness of return to driving recommendations Baseline:  Goal status: IN  PROGRESS  3.  Patient will dress herself with modified independence including right ankle brace Baseline:  Goal status: INITIAL  4.  Patient will demonstrate improved stand balance as evidenced by standing without UE support during functional activities.   Baseline:  Goal status: IN PROGRESS   ASSESSMENT:  CLINICAL IMPRESSION: Pt progressing slowly towards goals.  Pt would benefit from continued practice and focus on safety strategies for simulated IADLs. Pt limited by cognitive deficits and decr endurance.    PERFORMANCE DEFICITS in functional skills including coordination, dexterity, proprioception, sensation, tone, ROM, strength, flexibility, FMC, GMC, and UE functional use,   IMPAIRMENTS are limiting patient from ADLs and IADLs.   COMORBIDITIES may have co-morbidities  that affects occupational performance. Patient will benefit from skilled OT to address above impairments and improve overall function.  MODIFICATION OR ASSISTANCE TO COMPLETE EVALUATION: Min-Moderate modification of tasks or assist with assess necessary to complete an evaluation.  OT OCCUPATIONAL PROFILE AND HISTORY: Detailed assessment: Review of records and additional review of physical, cognitive, psychosocial history related to current functional performance.  CLINICAL DECISION MAKING: Moderate - several treatment options, min-mod task modification necessary  REHAB POTENTIAL: Good  EVALUATION COMPLEXITY: Moderate   PLAN: OT FREQUENCY: 1x/week  OT DURATION: 8 weeks  PLANNED INTERVENTIONS: self care/ADL training, therapeutic exercise, therapeutic activity, neuromuscular re-education, balance training, functional mobility training, aquatic therapy, patient/family education, visual/perceptual remediation/compensation, and DME and/or AE instructions  RECOMMENDED OTHER SERVICES: NA  CONSULTED AND AGREED WITH PLAN OF CARE: Patient  PLAN FOR NEXT SESSION:  practice IADLs, continue standing balance without UE  support, review HEP again, check STGs   Dyllin Gulley, OTR/L 04/07/2022, 2:54 PM

## 2022-04-07 ENCOUNTER — Encounter: Payer: Self-pay | Admitting: Occupational Therapy

## 2022-04-07 ENCOUNTER — Ambulatory Visit: Payer: Medicare Other | Admitting: Physical Therapy

## 2022-04-07 ENCOUNTER — Ambulatory Visit: Payer: Medicare Other | Attending: Physical Medicine and Rehabilitation | Admitting: Occupational Therapy

## 2022-04-07 ENCOUNTER — Ambulatory Visit: Payer: Medicare Other | Admitting: Speech Pathology

## 2022-04-07 DIAGNOSIS — M6281 Muscle weakness (generalized): Secondary | ICD-10-CM | POA: Insufficient documentation

## 2022-04-07 DIAGNOSIS — R41841 Cognitive communication deficit: Secondary | ICD-10-CM | POA: Diagnosis present

## 2022-04-07 DIAGNOSIS — Z9181 History of falling: Secondary | ICD-10-CM | POA: Diagnosis not present

## 2022-04-07 DIAGNOSIS — R482 Apraxia: Secondary | ICD-10-CM | POA: Diagnosis not present

## 2022-04-07 DIAGNOSIS — R2689 Other abnormalities of gait and mobility: Secondary | ICD-10-CM | POA: Diagnosis not present

## 2022-04-07 DIAGNOSIS — I69190 Apraxia following nontraumatic intracerebral hemorrhage: Secondary | ICD-10-CM | POA: Diagnosis not present

## 2022-04-07 DIAGNOSIS — R4701 Aphasia: Secondary | ICD-10-CM | POA: Diagnosis not present

## 2022-04-07 DIAGNOSIS — R278 Other lack of coordination: Secondary | ICD-10-CM | POA: Insufficient documentation

## 2022-04-07 DIAGNOSIS — R2681 Unsteadiness on feet: Secondary | ICD-10-CM | POA: Diagnosis not present

## 2022-04-07 DIAGNOSIS — I69351 Hemiplegia and hemiparesis following cerebral infarction affecting right dominant side: Secondary | ICD-10-CM | POA: Insufficient documentation

## 2022-04-07 NOTE — Therapy (Signed)
OUTPATIENT PHYSICAL THERAPY [facility] [name]   Patient Name: [name] MRN: [id number] DOB:1941-03-30, 81 y.o., female Today's Date: 04/08/2022   PCP: Pt unable to provide information and unable to locate in chart. REFERRING PROVIDER: Courtney Heys, MD      PT End of Session - 04/07/22 1408     Visit Number 13    Number of Visits 25   87+6 with recert   Date for PT Re-Evaluation 05/20/22    Authorization Type [employer] Medicare    Progress Note Due on Visit 10    PT Start Time 1408   from OT session   PT Stop Time 1446    PT Time Calculation (min) 38 min    Equipment Utilized During Treatment Gait belt    Activity Tolerance Patient tolerated treatment well    Behavior During Therapy WFL for tasks assessed/performed                    Past Medical History:  Diagnosis Date    Multiple pulmonary nodules, largest R mid lung 06/29/2013   Followed in Pulmonary clinic/ [facility]/ [place] - see CXR 06/27/13  - CT chest 07/14/2013 > 1. No acute findings in the thorax to account for the patient's  symptoms.  2. However, there is a subsolid nodule in the   right lower lobe that has a ground-glass attenuation component  measuring 2.5 x 1.8 cm, and a small central solid component  measuring 4 mm. Initial follow-up by chest CT without    Adenocarcinoma of lung, stage 1 ([place])    Status post right lower lobectomy August 2015   Anxiety    Asthma    Atrophic vaginitis    Chronic cough 12/20/2018   Chronic iritis of left eye    Pupil stays dilated; nonreactive   Chronic kidney disease, stage 3b (HCC)    Colon polyp    Contact lens/glasses fitting    wears contacts or glasses   Cough variant asthma with component of uacs  05/31/2013   Followed in Pulmonary clinic/ [facility]/ [place] in her 29's - Spirometry 02/23/13 wnl  - 07/03/2013 Sinus CT > Mild chronic sinus disease - No acute findings. Left-to-right nasal septal deviation of 3 mm. -med calendar 08/08/13 ,  redone 06/01/2016 and 02/22/2017  - Eos 4%   10/13/2013  > rec singulair daily  - FENO 05/12/2016  =   24 on singulair  - Allergy profile 05/12/2016 >   IgE  47 pos   Depression    Depression with anxiety    Dysuria 08/03/2019   Environmental allergies    Essential hypertension 02/23/2017   Changed losartan to avapro 02/22/2017 due to cough    GERD (gastroesophageal reflux disease)    Hypertension    Hypertriglyceridemia    Kidney stones    OA (osteoarthritis)    Osteopenia    PONV (postoperative nausea and vomiting)    S/P lobectomy of lung 02/12/2014   Status post total knee replacement, left 08/26/2018   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   Past Surgical History:  Procedure Laterality Date   APPENDECTOMY     COLONOSCOPY     EYE SURGERY Left    cataract removal   ORIF WRIST FRACTURE Left 08/04/2013   Procedure: OPEN REDUCTION INTERNAL FIXATION (ORIF) LEFT DISTAL RADIUS WRIST FRACTURE;  Surgeon: Wynonia Sours, MD;  Location: [facility];  Service: Orthopedics;  Laterality: Left;   PARTIAL HYSTERECTOMY  TOTAL KNEE ARTHROPLASTY Left 08/26/2018   Procedure: TOTAL KNEE ARTHROPLASTY;  Surgeon: Netta Cedars, MD;  Location: [facility];  Service: Orthopedics;  Laterality: Left;   VIDEO ASSISTED THORACOSCOPY (VATS)/WEDGE RESECTION Right 02/12/2014   Procedure: RIGHT VIDEO ASSISTED THORACOSCOPY RIGHT LOWER LOBE LUNG /WEDGE RESECTION,RIGHT THORACOTOMY WITH RIGHT LOWER LOBE LOBECTOMY & NODE DISSECTION;  Surgeon: Melrose Nakayama, MD;  Location: [facility];  Service: Thoracic;  Laterality: Right;   VIDEO ASSISTED THORACOSCOPY (VATS)/WEDGE RESECTION Right 02/12/2014   VIDEO BRONCHOSCOPY N/A 02/12/2014   Procedure: VIDEO BRONCHOSCOPY;  Surgeon: Melrose Nakayama, MD;  Location: [facility];  Service: Thoracic;  Laterality: N/A;   Patient Active Problem List   Diagnosis Date Noted   Acute ischemic left MCA stroke ([place]) 01/28/2022   Aphasia as late effect of cerebrovascular accident (CVA)  01/28/2022   Right hemiparesis ([place]) 01/28/2022   Abnormal gait due to muscle weakness 01/28/2022   Insomnia 10/13/2021   Frequency of urination 10/13/2021   Vitamin D deficiency 10/13/2021   Chronic renal failure 10/13/2021   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[place]) 09/24/2021   Aphasia due to acute cerebrovascular accident (CVA) ([place]) 09/21/2021   Chronic kidney disease, stage 3b ([place]) 09/21/2021   Depression with anxiety 09/21/2021   Hypertension 09/21/2021   Asthma, chronic 09/21/2021   Fall at home, initial encounter 09/21/2021   Right ankle pain 09/21/2021   Dysuria 08/03/2019   Chronic cough 12/20/2018   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   Status post total knee replacement, left 08/26/2018   Essential hypertension 02/23/2017   Adenocarcinoma of lung, stage 1 ([place]) 05/08/2014   S/P lobectomy of lung 02/12/2014   Asthma 01/30/2014    Multiple pulmonary nodules, largest R mid lung 06/29/2013   Cough variant asthma with component of uacs  05/31/2013    ONSET DATE: 01/28/2022   REFERRING DIAG: G81.91 (ICD-10-CM) - Right hemiparesis (HCC) I63.512 (ICD-10-CM) - Acute ischemic left MCA stroke (HCC)   THERAPY DIAG:  Other abnormalities of gait and mobility  History of falling  Muscle weakness (generalized)  Other lack of coordination  Rationale for Evaluation and Treatment Rehabilitation  SUBJECTIVE:                                                                                                                                                                                             SUBJECTIVE STATEMENT: Pt reports she just returned from her beach trip with a friend. Pt reports no falls or near falls while on vacation. Pt reports she did not use her rollator much but mostly furniture walked. Pt reports some ongoing soreness in her  R foot. Has not been wearing her ankle brace and has not needed it.  Pt accompanied by: self   PERTINENT  HISTORY: hx of hypertension, thrombotic stroke involving left middle cerebral artery, asthma, lung cancer, coronary calcification seen on CT and CKD   PAIN:  Are you having pain? No  PRECAUTIONS: Fall  PATIENT GOALS "to be able to walk without help and without pain"  OBJECTIVE:   TODAY'S TREATMENT: THER ACT:  [facility] PT Assessment - 04/07/22 1416       Ambulation/Gait   Gait velocity 32.8' over 15.6 sec = 2.1 ft/sec      Standardized Balance Assessment   Standardized Balance Assessment Timed Up and Go Test;Five Times Sit to Stand;Berg Balance Test    Five times sit to stand comments  15.28 sec, BUE on arms of chair      Berg Balance Test   Sit to Stand Able to stand without using hands and stabilize independently    Standing Unsupported Able to stand safely 2 minutes    Sitting with Back Unsupported but Feet Supported on Floor or Stool Able to sit safely and securely 2 minutes    Stand to Sit Sits safely with minimal use of hands    Transfers Able to transfer safely, minor use of hands    Standing Unsupported with Eyes Closed Able to stand 10 seconds with supervision    Standing Unsupported with Feet Together Able to place feet together independently and stand for 1 minute with supervision    From Standing, Reach Forward with Outstretched Arm Reaches forward but needs supervision    From Standing Position, Pick up Object from [place] to pick up shoe safely and easily    From Standing Position, Turn to Look Behind Over each Shoulder Looks behind from both sides and weight shifts well    Turn 360 Degrees Able to turn 360 degrees safely but slowly   5 sec L and R   Standing Unsupported, Alternately Place Feet on Step/Stool Able to complete >2 steps/needs minimal assist    Standing Unsupported, One Foot in Front Needs help to step but can hold 15 seconds    Standing on One Leg Tries to lift leg/unable to hold 3 seconds but remains standing independently    Total Score 40    Berg  comment: 40/56, significant fall risk      Timed Up and Go Test   TUG Normal TUG    Normal TUG (seconds) 12.6   no AD           During session pt exhibits frequent LOB posteriorly when unbalanced or when backing up to chair to sit  Updated patient instructions for patient/family being on the same page re: AD use and provided handout for patient.  Ambulation with alt L/R gumdrop taps with CGA for balance Progression to sidesteps with gumdrops taps with min A needed due to difficulty sequencing task and frequent LOB  PATIENT EDUCATION: Education details: continue HEP, educated pt on decrease in balance and overall function from previous sessions due to not working on her HEP while on vacation and not being able to attend therapy for a few weeks--educated pt on importance of consistency with her HEP in order to see progress and not lose gains made in therapy Person educated: Patient Education method: Explanation, Demonstration, and Handouts Education comprehension: verbalized understanding   HOME EXERCISE PROGRAM: Access Code: [email or web address] URL: [email or web address] Date: 02/10/2022 Prepared by: Excell Seltzer  Exercises - Side Stepping  with Resistance at Ankles and Counter Support  - 1 x daily - 7 x weekly - 3 sets - 10 reps - Forward Step Up with Counter Support  - 1 x daily - 7 x weekly - 3 sets - 10 reps - Towel Scrunches  - 1 x daily - 7 x weekly - 2 sets - 10 reps - Seated Ankle Alphabet  - 1 x daily - 7 x weekly - 1 sets - 10 reps - Mini Squat with Counter Support  - 1 x daily - 7 x weekly - 3 sets - 10 reps   GOALS: Goals reviewed with patient? Yes  SHORT TERM GOALS: Target date: 03/03/2022  Initiate HEP Baseline: initiated 8/8 Goal status: MET  2.  Pt will improve gait velocity to at least 2.5 ft/sec for improved gait efficiency and performance at Supervision level with LRAD Baseline: 2.26 ft/sec S* with RW (8/1), 2.35 ft/sec (8/24) Goal status: IN  PROGRESS  3.  Pt will improve normal TUG to less than or equal to 13 seconds for improved functional mobility and decreased fall risk. Baseline: 14 sec with RW (8/1), 11.91 sec with no AD (8/24) Goal status: MET  4.  Pt will improve Berg score to 40/56  for decreased fall risk Baseline: 34/56 (8/8); 37/56 on 9/6 Goal status: NOT MET  5.  Pt will improve 5 x STS to less than or equal to 12 seconds to demonstrate improved functional strength and transfer efficiency.  Baseline: 13.8 (8/1), 15.28 sec (8/24) Goal status: IN PROGRESS   LONG TERM GOALS: Target date: 03/31/2022  Pt will be independent with balance and strengthening HEP for improved strength, balance, transfers and gait. Baseline:  Goal status: IN PROGRESS  2.  Pt will improve gait velocity to at least 3.0 ft/sec for improved gait efficiency and performance at mod I level with LRAD Baseline: 2.26 ft/sec S* with RW (8/1), 2.35 ft/sec with RW (8/24), 2.1 ft/sec with no AD (10/3) Goal status: NOT MET  3.  Pt will improve normal TUG to less than or equal to 12 seconds for improved functional mobility and decreased fall risk. Baseline: 14 sec with RW (8/1), 11.91 sec with no AD (8/24), 12.6 sec with no AD (10/3) Goal status: NOT MET  4.  Pt will improve Berg score to 46/56 for decreased fall risk Baseline: 34/56 (8/8), 40/56 (10/3) Goal status: IN PROGRESS  5.  Pt will improve 5 x STS to less than or equal to 10 seconds to demonstrate improved functional strength and transfer efficiency.  Baseline: 13.8 sec (8/1), 15.28 sec (8/24), 15.28 sec (10/3) Goal status: IN PROGRESS   LONG TERM GOALS: Target date: 05/20/2022  Pt will be independent with balance and strengthening HEP for improved strength, balance, transfers and gait. Baseline:  Goal status: IN PROGRESS  2.  Pt will improve gait velocity to at least 3.0 ft/sec for improved gait efficiency and performance at mod I level with LRAD Baseline: 2.26 ft/sec S* with RW  (8/1), 2.35 ft/sec with RW (8/24), 2.1 ft/sec with no AD (10/3) Goal status: IN PROGRESS  3.  Pt will improve normal TUG to less than or equal to 12 seconds for improved functional mobility and decreased fall risk. Baseline: 14 sec with RW (8/1), 11.91 sec with no AD (8/24), 12.6 sec with no AD (10/3) Goal status: IN PROGRESS  4.  Pt will improve Berg score to 46/56 for decreased fall risk Baseline: 34/56 (8/8), 40/56 (10/3) Goal status: IN PROGRESS  5.  Pt will improve 5 x STS to less than or equal to 13 seconds to demonstrate improved functional strength and transfer efficiency.  Baseline: 13.8 sec (8/1), 15.28 sec (8/24), 15.28 sec (10/3) Goal status: REVISED   ASSESSMENT:  CLINICAL IMPRESSION: Emphasis of skilled PT session on reassessing LTG and creating new LTG for recertification of PT services. Pt has met 0/5 LTG due to missing several weeks of therapy due to being on vacation and not being consistent with performance of her HEP. Pt is making progress towards some goals including improving her Berg score from 34/56 to 40/56 this date though did not meet the goal of 46/56. Pt also exhibits decreased gait speed from last assessment to 2.1 ft/sec, increased time on the TUG of 12.6 sec, and no change in her 5xSTS score at 15.28 seconds. Pt would benefit from a recertification of PT services in order to improve her balance, decrease her fall risk, and in order to add to and review her HEP to increase compliance. Continue POC.    OBJECTIVE IMPAIRMENTS Abnormal gait, decreased balance, decreased coordination, decreased mobility, decreased strength, decreased safety awareness, and pain.   ACTIVITY LIMITATIONS carrying, lifting, bending, squatting, stairs, transfers, bathing, dressing, and hygiene/grooming  PARTICIPATION LIMITATIONS: meal prep, cleaning, laundry, medication management, driving, shopping, community activity, yard work, and school  PERSONAL FACTORS hx of hypertension,  thrombotic stroke involving left middle cerebral artery, asthma, lung cancer, coronary calcification seen on CT and CKD  are also affecting patient's functional outcome.   REHAB POTENTIAL: Good  CLINICAL DECISION MAKING: Stable/uncomplicated  EVALUATION COMPLEXITY: Moderate  PLAN: PT FREQUENCY: 2x/week  PT DURATION: 8 weeks+ 8 visits (recert)  PLANNED INTERVENTIONS: Therapeutic exercises, Therapeutic activity, Neuromuscular re-education, Balance training, Gait training, Patient/Family education, Self Care, Joint mobilization, Stair training, DME instructions, Electrical stimulation, Cryotherapy, Moist heat, Taping, Manual therapy, and Re-evaluation  PLAN FOR NEXT SESSION:  add to HEP for balance and strengthening, obstacle course, LE coordination, tandem gait, backwards gait, keep working on gait with no AD (carrying stuff with no UE support, uneven surface)   Excell Seltzer, PT, DPT, CSRS 04/08/2022, 4:18 PM

## 2022-04-07 NOTE — Patient Instructions (Signed)
[name] has made great progress with her balance during physical therapy sessions. She is able to demonstrate the ability to safely walk household distances without using her walker. When she goes out into the community she needs to use her rollator but at home she is ok to not use the walker.  Excell Seltzer, PT, DPT, CSRS 04/07/2022

## 2022-04-07 NOTE — Patient Instructions (Signed)
Medications:   We are working on increasing your ability to do things independently   You are going to set a daily alarm on Alexa for 9 am and 7 pm medication administration   Your job, is to take the meds when the alarm tells you to, or as soon after as you can  Your care partner's job is to check and gently remind if you forgot. Try and cue as vaguely as possible so [name] can recall

## 2022-04-07 NOTE — Therapy (Signed)
OUTPATIENT SPEECH LANGUAGE PATHOLOGY TREATMENT   Patient Name: [name] MRN: [id number] DOB:12-25-1940, 81 y.o., female Today's Date: 04/07/2022  PCP: none on file REFERRING PROVIDER: Courtney Heys, MD   End of Session - 04/07/22 1230     Visit Number 11    Number of Visits 18    Date for SLP Re-Evaluation 04/28/22   +4 weeks for recert   Progress Note Due on Visit 20    SLP Start Time 3614    SLP Stop Time  1315    SLP Time Calculation (min) 44 min    Activity Tolerance Patient tolerated treatment well                      Past Medical History:  Diagnosis Date    Multiple pulmonary nodules, largest R mid lung 06/29/2013   Followed in Pulmonary clinic/ [facility]/ [place] - see CXR 06/27/13  - CT chest 07/14/2013 > 1. No acute findings in the thorax to account for the patient's  symptoms.  2. However, there is a subsolid nodule in the   right lower lobe that has a ground-glass attenuation component  measuring 2.5 x 1.8 cm, and a small central solid component  measuring 4 mm. Initial follow-up by chest CT without    Adenocarcinoma of lung, stage 1 ([place])    Status post right lower lobectomy August 2015   Anxiety    Asthma    Atrophic vaginitis    Chronic cough 12/20/2018   Chronic iritis of left eye    Pupil stays dilated; nonreactive   Chronic kidney disease, stage 3b ([facility])    Colon polyp    Contact lens/glasses fitting    wears contacts or glasses   Cough variant asthma with component of uacs  05/31/2013   Followed in Pulmonary clinic/ [facility]/ [place] in her 31's - Spirometry 02/23/13 wnl  - 07/03/2013 Sinus CT > Mild chronic sinus disease - No acute findings. Left-to-right nasal septal deviation of 3 mm. -med calendar 08/08/13 , redone 06/01/2016 and 02/22/2017  - Eos 4%   10/13/2013  > rec singulair daily  - FENO 05/12/2016  =   24 on singulair  - Allergy profile 05/12/2016 >   IgE  47 pos   Depression    Depression with anxiety    Dysuria  08/03/2019   Environmental allergies    Essential hypertension 02/23/2017   Changed losartan to avapro 02/22/2017 due to cough    GERD (gastroesophageal reflux disease)    Hypertension    Hypertriglyceridemia    Kidney stones    OA (osteoarthritis)    Osteopenia    PONV (postoperative nausea and vomiting)    S/P lobectomy of lung 02/12/2014   Status post total knee replacement, left 08/26/2018   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   Past Surgical History:  Procedure Laterality Date   APPENDECTOMY     COLONOSCOPY     EYE SURGERY Left    cataract removal   ORIF WRIST FRACTURE Left 08/04/2013   Procedure: OPEN REDUCTION INTERNAL FIXATION (ORIF) LEFT DISTAL RADIUS WRIST FRACTURE;  Surgeon: Wynonia Sours, MD;  Location: [facility];  Service: Orthopedics;  Laterality: Left;   PARTIAL HYSTERECTOMY     TOTAL KNEE ARTHROPLASTY Left 08/26/2018   Procedure: TOTAL KNEE ARTHROPLASTY;  Surgeon: Netta Cedars, MD;  Location: [facility];  Service: Orthopedics;  Laterality: Left;   VIDEO ASSISTED THORACOSCOPY (VATS)/WEDGE RESECTION Right 02/12/2014   Procedure:  RIGHT VIDEO ASSISTED THORACOSCOPY RIGHT LOWER LOBE LUNG /WEDGE RESECTION,RIGHT THORACOTOMY WITH RIGHT LOWER LOBE LOBECTOMY & NODE DISSECTION;  Surgeon: Melrose Nakayama, MD;  Location: [facility];  Service: Thoracic;  Laterality: Right;   VIDEO ASSISTED THORACOSCOPY (VATS)/WEDGE RESECTION Right 02/12/2014   VIDEO BRONCHOSCOPY N/A 02/12/2014   Procedure: VIDEO BRONCHOSCOPY;  Surgeon: Melrose Nakayama, MD;  Location: [facility];  Service: Thoracic;  Laterality: N/A;   Patient Active Problem List   Diagnosis Date Noted   Acute ischemic left MCA stroke ([place]) 01/28/2022   Aphasia as late effect of cerebrovascular accident (CVA) 01/28/2022   Right hemiparesis ([place]) 01/28/2022   Abnormal gait due to muscle weakness 01/28/2022   Insomnia 10/13/2021   Frequency of urination 10/13/2021   Vitamin D deficiency 10/13/2021   Chronic renal failure  10/13/2021   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[place]) 09/24/2021   Aphasia due to acute cerebrovascular accident (CVA) ([place]) 09/21/2021   Chronic kidney disease, stage 3b ([place]) 09/21/2021   Depression with anxiety 09/21/2021   Hypertension 09/21/2021   Asthma, chronic 09/21/2021   Fall at home, initial encounter 09/21/2021   Right ankle pain 09/21/2021   Dysuria 08/03/2019   Chronic cough 12/20/2018   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   Status post total knee replacement, left 08/26/2018   Essential hypertension 02/23/2017   Adenocarcinoma of lung, stage 1 ([place]) 05/08/2014   S/P lobectomy of lung 02/12/2014   Asthma 01/30/2014    Multiple pulmonary nodules, largest R mid lung 06/29/2013   Cough variant asthma with component of uacs  05/31/2013    ONSET DATE: March 2023, referral July 2023   REFERRING DIAG:  I69.320 (ICD-10-CM) - Aphasia as late effect of cerebrovascular accident (CVA)  I63.512 (ICD-10-CM) - Acute ischemic left MCA stroke ([place])    THERAPY DIAG:  Cognitive communication deficit  Aphasia  Apraxia  Rationale for Evaluation and Treatment Rehabilitation  SUBJECTIVE:   SUBJECTIVE STATEMENT: Pt attends with caregiver, reports pt spent day alone Sunday and forgot meds x1.   PAIN:  Are you having pain? No   OBJECTIVE:  TODAY'S TREATMENT:  04-07-22: SLP provided education for use of cognitive assistive devices, carefully selected for needs and accessiblity to aid in recall of required tasks. Pt reportedly missed 1 medication dose while caregiver was gone on Sunday. Pt reports does have Alexa. Generated strategy to use daily reminder via [place] for medication administration with back up of caregivers providing verbal-A. Provided coaching for level of cueing caregiver can provide to A in increased independence in medication administration. Generated strategy of asking for information in writing to A in recall of details of important  conversations. Pt to employ with PT later this date for recall of suggestions re: ambulation at home. Pt verbalizes understand of all targeted interventions, denies questions. SLP to assess understanding next session through teach back and report of employment of strategies.   03-24-22: Target strengthening orthographic representation of phonemic targets to enable improved ability to write. SLP initiated Copy and Recall Treatment (CART) with set of 20 words to facilitate key word usage for future phonological to orthographic correspondence in self-generated written words. Pt was able to name 6/6 words without cueing. Pt was able to write 20% of pictured words independently at baseline. The patient was able to copy a written word with 76% accuracy, independently. With occasional min-A  from SLP, this improved to 100% accuracy. Self-ID of errors in approximately 50% of trials. With written visual cues removed, the patient  was able to recall and write the target words with 85% accuracy on initial attempt. Corrects errors with min verbal cues. Update HEP.   03-20-22: Target word finding through divergent naming of abstract categories. Pt generates x3 items in x10 categories with supervision-A provided with usual extended time. Targeted word finding, verbal apraxia, sentence generation with Pharmacist, hospital ([facility]). For the verbs bake, drive, fix, the patient generated 3 subject and objects for each verb, a total of 9 subjects and 9 objects with usual verbal cues required following independent generation of initial subject/verb. Pt generated complex sentences for each verb, by answering "wh" questions with usual verbal cues, for a total of 6 complex sentences. Following, pt able to relay last time friend fixed something in her home, demonstrating usual anomia and halting speech. Verbal cues to describe intermittently successful. Discussion with daughter at conclusion of session, highlighting some  concerns: sequencing of daily tasks, cell phone usage, short term memory. Will consider for re-recert.   03-18-22:Target verb ID and use at sentence level in picture description task. Given photo, pt able to generate simple sentence with appropriate syntax in 14/15. Generates complex sentence providing additional information in 12/15 trials. Occasional min-A, use of questioning cues to aid in completion of task, x1 cue for anomia strategy use of cueing with first letter. Pt reports upcoming beach trip. Use topic in discourse level task, targeting word finding and cohesiveness at discourse level. Pt generates details, given framework for narrative featuring beginning, middle, end. Pt wih usual vague language, agrammatical. Generate script for practice, then attempt discourse again with improved cohesiveness demonstrated. Details provided increased from x3 to x11 following script practice. Pt able to teach back anomia strategy of self-cueing for initial letter to aid in word finding.    PATIENT EDUCATION: Education details: see above Person educated: Patient Education method: Customer service manager Education comprehension: verbalized understanding, returned demonstration, and needs further education   GOALS: Goals reviewed with patient? Yes  SHORT TERM GOALS: Target date: 03/03/22  Pt will verbalize 7 items in personally relevant categories with occasional min-A over 2 therapy sessions Baseline: 02-24-22; 02/26/22 Goal status: MET  2.  Pt will correct semantic paraphasia given usual mod-A in 70% of opportunities during structured task over 2 sessions.  Baseline: 02-24-22; 02-26-22 Goal status: MET  3.  Pt will write x5 item list with 80% accuracy, given usual mod-A over 2 sessions.  Baseline: 02-24-22; 02-26-22 Goal status: NOT MET  4.  Pt will successfully use trained anomia compensation in x3 opportunities across 2 therapy sessions with occasioanl min-A.  Baseline: 02-24-22; 02-26-22 Goal  status: PARTIALLY MET   LONG TERM GOALS: Target date: 04/28/2022 (+4 weeks for recert)   Pt will identify, and correct, 50% of semantic paraphasias during structured task with occasional min-A over 2 sessions.  Baseline:  Goal status: MET  2.  Pt will report improvement of 2 points via CPIB PROM by d/c Baseline: 15 Goal status: IN PROGRESS  3.  Pt will carryover dysnomia compensations to conversational speech, resulting in strategy use in 70% of opportunities over 15 minute conversation Baseline:  Goal status: IN PROGRESS  4.  Pt will write x10 phrases, with mod-I, with 80% accuracy over 2 sessions.   Baseline:  Goal status: IN PROGRESS  5.  Pt will carryover compensations for increased indpendence in home based tasks, resulting in successful completion of pt selected tasks over 1 week period Baseline:  Goal status: new at recert  5.  Pt  will teach back memory compensations to aid in recall of information discussed in conversations with rare min-A Baseline:  Goal status: new at recert  ASSESSMENT:  CLINICAL IMPRESSION: Patient is a 81 y.o. F presenting with mild-moderate expressive aphasia. Moderate improvement in discourse level speech, though intermittent vague language persists. Occasional anomia, with use of anomia strategies ~50% of opportunities. Decreased instances of paraphasias with increased awareness and ability to correct. Written expression impaired, yet improving at word level. SLP has initiated skilled interventions and strategies to aid in abilites for desired functional tasks at home such as grocery list creation. SLP has initiated structured language tasks to aid in improved expressive language and enhanced communication confidence and efficacy. Additional therapy goals added per daughter concerns re: cognition impacting successful participation in home based activities. Initiated training of external cognitive assistive device as memory aid and caregiver education  for appropriate cuing for routine changes to encourage independence in pt selected task of med administration.   OBJECTIVE IMPAIRMENTS include expressive language, aphasia, and apraxia. These impairments are limiting patient from effectively communicating at home and in community. Factors affecting potential to achieve goals and functional outcome are family/community support. Patient will benefit from skilled SLP services to address above impairments and improve overall function.  REHAB POTENTIAL: Good  PLAN: SLP FREQUENCY: 2x/week  SLP DURATION: 8 weeks  PLANNED INTERVENTIONS: Language facilitation, Cueing hierachy, Internal/external aids, Functional tasks, Multimodal communication approach, SLP instruction and feedback, Compensatory strategies, and Patient/family education    Su Monks, CCC-SLP 04/07/2022, 3:31 PM

## 2022-04-08 NOTE — Progress Notes (Unsigned)
[facility] [address] [place]. [place] [address] [phone]       STROKE FOLLOW UP NOTE  [name] Date of Birth:  12-09-1940 Medical Record Number:  [id number]   Reason for Referral: stroke follow up    SUBJECTIVE:   CHIEF COMPLAINT:  Chief Complaint  Patient presents with   Follow-up    RM 2 alone  Pt is well and stable, no new concerns     HPI:   Update 04/09/2022 JM: patient returns for 4 month stroke follow up unaccompanied (caregiver waiting in lobby). Overall stable. Denies new stroke/TIA symptoms. Reports residual gait impairment, and aphasia. Ambulates with rollator walker outdoors, does not use in doors. Lives in own home but does have aides that come throughout the day.  Continues right ankle issues, was seen by ortho and recommended conservative treatment, was wearing a brace but she doesn't feel this is needed at this point. Mild swelling noted. Continues working with PT/OT/SLP.  Remains on aspirin and fenofibrate, denies side effects. Blood pressure 151/78, monitors at home by [facility] nurse, yesterdays reading was 130/79. Routinely follows with PCP (does have PCP but unable to remember their name or office) and cardiology.  No further concerns at this time.    History provided for reference purposes only  Initial visit 12/11/2021 JM: patient is being seen for initial hospital follow-up accompanied by her daughter. She has been doing well since discharge. Currently working with [facility] PT and SLP. Completed OT. Reports continued aphasia, right leg weakness, gait impairment and cognitive impairment, has been gradually improving. She lives alone, has aides that stay with her for 12 hours per day. Daughter questions if she still needs as much supervision. Daughter lives in [place], she works as a Astronomer. She is concerned that her right foot dragged some while walking back to exam room, she has not noticed that before. Patient has been  experiencing increased ankle pain over the past few weeks. Does report occasional dragging, "it comes and goes". Did have ankle pain post fall, x-ray during admission no acute findings.  Denies new stroke/TIA symptoms.  Remains on both aspirin and Plavix as well as fenofibrate, denies side effects.  Blood pressure today 128/75.  Home health routinely monitors which has been stable.  Has since seen PCP since discharge.  No further concerns at this time.  Stroke admission 09/21/2021 81 year old female with history of hypertension, CKD, lung cancer status post lobectomy who presented on 09/21/2021 with aphasia after fall and right-sided weakness.  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  CT head and neck showed distal left M2 occlusion, right ICA 70% stenosis, bilateral siphon and left ICA bulb atherosclerosis.  MRI showed left MCA moderate-sized infarct as well as old infarcts of the left basal ganglia and both cerebellar hemispheres.  LE venous Doppler no DVT.  EF 60 to 65%.  LDL 69, A1c 5.2.  Evaluated by Dr. Erlinda Hong, etiology likely due to intracranial stenosis given diffuse intracranial arthrosclerosis however cardioembolic source cannot be completely ruled out.  Recommended 30-day cardiac event monitor to rule out A-fib.  Recommended DAPT for 3 months then aspirin alone and continue fenofibrate.  Therapy eval's recommended CIR for ongoing therapy needs.      PERTINENT IMAGING  Per hospitalization 09/21/2021 Code Stroke CT head: No acute intracranial abnormality. Small right frontal parietal scalp hematoma.  No acute cervical spine fractures or supplementation.  Chronic lacunar infarcts in the left caudate head,  new since 2016. CTA head: Occlusion of the distal left MCA M2 branch, at the posterior sylvian fissure, corresponding to a location of previously identified infarct. CTA neck: 70% stenosis of the proximal right ICA secondary to predominantly calcified  atherosclerosis. MRI: Early subacute infarct within the left MCA territory involving the left parietal and temporal lobes.  No other side of acute ischemia.  There are old infarcts of the left basal ganglia and both cerebellar hemisphere. 2D echo, EF 60 to 65% LE venous Doppler negative bilaterally for DVT    ROS:   14 system review of systems performed and negative with exception of those listed in HPI  PMH:  Past Medical History:  Diagnosis Date    Multiple pulmonary nodules, largest R mid lung 06/29/2013   Followed in Pulmonary clinic/ [facility]/ [place] - see CXR 06/27/13  - CT chest 07/14/2013 > 1. No acute findings in the thorax to account for the patient's  symptoms.  2. However, there is a subsolid nodule in the   right lower lobe that has a ground-glass attenuation component  measuring 2.5 x 1.8 cm, and a small central solid component  measuring 4 mm. Initial follow-up by chest CT without    Adenocarcinoma of lung, stage 1 ([place])    Status post right lower lobectomy August 2015   Anxiety    Asthma    Atrophic vaginitis    Chronic cough 12/20/2018   Chronic iritis of left eye    Pupil stays dilated; nonreactive   Chronic kidney disease, stage 3b ([facility])    Colon polyp    Contact lens/glasses fitting    wears contacts or glasses   Cough variant asthma with component of uacs  05/31/2013   Followed in Pulmonary clinic/ [facility]/ [place] in her 20's - Spirometry 02/23/13 wnl  - 07/03/2013 Sinus CT > Mild chronic sinus disease - No acute findings. Left-to-right nasal septal deviation of 3 mm. -med calendar 08/08/13 , redone 06/01/2016 and 02/22/2017  - Eos 4%   10/13/2013  > rec singulair daily  - FENO 05/12/2016  =   24 on singulair  - Allergy profile 05/12/2016 >   IgE  47 pos   Depression    Depression with anxiety    Dysuria 08/03/2019   Environmental allergies    Essential hypertension 02/23/2017   Changed losartan to avapro 02/22/2017 due to cough    GERD  (gastroesophageal reflux disease)    Hypertension    Hypertriglyceridemia    Kidney stones    OA (osteoarthritis)    Osteopenia    PONV (postoperative nausea and vomiting)    S/P lobectomy of lung 02/12/2014   Status post total knee replacement, left 08/26/2018   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    PSH:  Past Surgical History:  Procedure Laterality Date   APPENDECTOMY     COLONOSCOPY     EYE SURGERY Left    cataract removal   ORIF WRIST FRACTURE Left 08/04/2013   Procedure: OPEN REDUCTION INTERNAL FIXATION (ORIF) LEFT DISTAL RADIUS WRIST FRACTURE;  Surgeon: Wynonia Sours, MD;  Location: [facility];  Service: Orthopedics;  Laterality: Left;   PARTIAL HYSTERECTOMY     TOTAL KNEE ARTHROPLASTY Left 08/26/2018   Procedure: TOTAL KNEE ARTHROPLASTY;  Surgeon: Netta Cedars, MD;  Location: [facility];  Service: Orthopedics;  Laterality: Left;   VIDEO ASSISTED THORACOSCOPY (VATS)/WEDGE RESECTION Right 02/12/2014   Procedure: RIGHT VIDEO ASSISTED THORACOSCOPY RIGHT LOWER LOBE LUNG /WEDGE RESECTION,RIGHT THORACOTOMY  WITH RIGHT LOWER LOBE LOBECTOMY & NODE DISSECTION;  Surgeon: Melrose Nakayama, MD;  Location: [facility];  Service: Thoracic;  Laterality: Right;   VIDEO ASSISTED THORACOSCOPY (VATS)/WEDGE RESECTION Right 02/12/2014   VIDEO BRONCHOSCOPY N/A 02/12/2014   Procedure: VIDEO BRONCHOSCOPY;  Surgeon: Melrose Nakayama, MD;  Location: [facility];  Service: Thoracic;  Laterality: N/A;    Social History:  Social History   Socioeconomic History   Marital status: Widowed    Spouse name: Not on file   Number of children: 2   Years of education: Not on file   Highest education level: Not on file  Occupational History   Occupation: Engineer, 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  Tobacco Use   Smoking status: Former    Packs/day: 0.75    Years: 20.00    Total pack years: 15.00    Types: Cigarettes    Quit date: 1980    Years since quitting: 43.7   Smokeless tobacco: Never  Vaping Use   Vaping Use:  Never used  Substance and Sexual Activity   Alcohol use: Yes    Alcohol/week: 1.0 standard drink of alcohol    Types: 1 Glasses of wine per week    Comment: occasional   Drug use: No   Sexual activity: Not on file  Other Topics Concern   Not on file  Social History Narrative   ** Merged History Encounter **       Social Determinants of Health   Financial Resource Strain: Not on file  Food Insecurity: Not on file  Transportation Needs: Not on file  Physical Activity: Not on file  Stress: Not on file  Social Connections: Not on file  Intimate Partner Violence: Not on file    Family History:  Family History  Problem Relation Age of Onset   Stroke Father    Asthma Father    Heart disease Father        CABG x 2   Lung cancer Sister        smoked   Breast cancer Daughter 33    Medications:   Current Outpatient Medications on File Prior to Visit  Medication Sig Dispense Refill   albuterol (VENTOLIN HFA) 108 (90 Base) MCG/ACT inhaler TAKE 2 PUFFS BY MOUTH EVERY 6 HOURS AS NEEDED FOR WHEEZE OR SHORTNESS OF BREATH 6.7 each 5   alendronate (FOSAMAX) 70 MG tablet      amLODipine (NORVASC) 10 MG tablet Take 1 tablet (10 mg total) by mouth every morning. 10 tablet 0   amLODipine (NORVASC) 5 MG tablet 5 mg by oral route.     Ascorbic Acid (VITAMIN C) 100 MG tablet Take 1 tablet (100 mg total) by mouth daily. 30 tablet 0   aspirin EC 81 MG tablet Take 81 mg by mouth daily. Swallow whole.     Besifloxacin HCl (BESIVANCE) 0.6 % SUSP      budesonide-formoterol (SYMBICORT) 80-4.5 MCG/ACT inhaler TAKE 2 PUFFS BY MOUTH TWICE A DAY     Calcium-Magnesium-Vitamin D (CALCIUM 1200+D3 PO) Take 1 tablet by mouth daily.      chlorpheniramine (CHLOR-TRIMETON) 4 MG tablet      Cholecalciferol (VITAMIN D3 PO) Take 1 capsule by mouth daily.     dorzolamide-timolol (COSOPT) 22.3-6.8 MG/ML ophthalmic solution Place 1 drop into both eyes 2 (two) times daily.     famotidine (PEPCID) 20 MG tablet 1  tablet at bedtime as needed     fenofibrate 160 MG tablet Take 160 mg by mouth daily.  FLUoxetine (PROZAC) 40 MG capsule 1 capsule     gabapentin (NEURONTIN) 100 MG capsule Take 1 capsule (100 mg total) by mouth 3 (three) times daily. One three times daily 270 capsule 1   losartan (COZAAR) 100 MG tablet      montelukast (SINGULAIR) 10 MG tablet Take 1 tablet (10 mg total) by mouth at bedtime. 90 tablet 0   Multiple Vitamins-Minerals (CENTRUM SILVER PO) Take 1 tablet by mouth daily.     solifenacin (VESICARE) 10 MG tablet Take 10 mg by mouth daily.     traZODone (DESYREL) 50 MG tablet Take 50 mg by mouth at bedtime.     No current facility-administered medications on file prior to visit.    Allergies:   Allergies  Allergen Reactions   Fosamax [Alendronate] Other (See Comments)   Grass Extracts [Gramineae Pollens] Other (See Comments)    Unknown   Nsaids Other (See Comments)    Elevated kidney fx Other reaction(s): abn kidney functions   Other    Prednisone Nausea And Vomiting and Other (See Comments)    Can take Prednisone injection but not orally      OBJECTIVE:  Physical Exam  Vitals:   04/09/22 1303  BP: (!) 151/78  Pulse: 71  Weight: 173 lb (78.5 kg)  Height: 5\' 2"  (1.575 m)   Body mass index is 31.64 kg/m. No results found.  General: well developed, well nourished, very pleasant elderly Caucasian female, seated, in no evident distress Head: head normocephalic and atraumatic.   Neck: supple with no carotid or supraclavicular bruits Cardiovascular: regular rate and rhythm, no murmurs Musculoskeletal: no deformity Skin:  no rash/petichiae Vascular:  Normal pulses all extremities   Neurologic Exam Mental Status: Awake and fully alert.  Mild to moderate aphasia.  Unable to appreciate receptive aphasia.  Follows commands without difficulty.  Oriented to place and time. Recent memory impaired and remote memory intact. Attention span, concentration and fund of  knowledge intact during visit. Mood and affect appropriate.  Cranial Nerves: Pupils equal, briskly reactive to light. Extraocular movements full without nystagmus. Visual fields full to confrontation. Hearing intact. Facial sensation intact. Face, tongue, palate moves normally and symmetrically.  Motor: Normal bulk and tone. Normal strength in all tested extremity muscles except very slight right ankle weakness Sensory.: intact to touch , pinprick , position and vibratory sensation.  Coordination: Rapid alternating movements normal in all extremities. Finger-to-nose and heel-to-shin performed accurately bilaterally. Gait and Station: Arises from chair with mild difficulty. Stance is slightly hunched. Gait demonstrates slow cautious steps with decreased stride length and step height RLE, foot slightly turned out with use of rollator walker.  Tandem walk and heel toe not attempted Reflexes: 1+ and symmetric. Toes downgoing.         ASSESSMENT: [name] is a 81 y.o. 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 Vascular risk factors include HTN, HLD, advanced age, former tobacco use, and prior strokes on imaging.      PLAN:  L MCA stroke :  Residual deficit: gait impairment and expressive aphasia. Some ankle weakness noted but suspect this is more from prior ankle injury. Encouraged continued participation with outpatient therapies for hopeful further recovery. Continue aspirin 81 mg daily and  fenofibrate for secondary stroke prevention.   Cardiac monitor completed which was negative for A-fib Discussed  secondary stroke prevention measures and importance of close PCP follow up for aggressive stroke risk factor management including BP goal<130/90 and HLD with LDL goal<70.  I have gone  over the pathophysiology of stroke, warning signs and symptoms, risk factors and their management in some detail with instructions to go to the closest emergency room for symptoms of concern. R ICA stenosis: CTA 09/2021 70% stenosis.  Repeat carotid ultrasound. If stable, can be monitored ongoing by PCP or cardiology, if progressed, will refer to [facility]    Overall stable from stroke standpoint without further recommendations and risk factors are managed by PCP. She may follow up PRN, as usual for our patients who are strictly being followed for stroke. If any new neurological issues should arise, request PCP place referral for evaluation by one of our neurologists. Thank you.     CC:  PCP: Courtney Heys, MD    I spent 34 minutes of face-to-face and non-face-to-face time with patient.  This included previsit chart review, lab review, study review, order entry, electronic health record documentation, patient education regarding prior stroke including etiology and residual deficits, secondary stroke prevention measures and importance of managing stroke risk factors and answered all other questions to patient satisfaction   Frann Rider, [facility]  [facility] [address] [place], [place] [address]  Phone [phone] Fax [phone] Note: This document was prepared with digital dictation and possible smart phrase technology. Any transcriptional errors that result from this process are unintentional.

## 2022-04-09 ENCOUNTER — Encounter: Payer: Medicare Other | Admitting: Occupational Therapy

## 2022-04-09 ENCOUNTER — Encounter: Payer: Self-pay | Admitting: Adult Health

## 2022-04-09 ENCOUNTER — Ambulatory Visit: Payer: Medicare Other | Admitting: Adult Health

## 2022-04-09 ENCOUNTER — Ambulatory Visit: Payer: Medicare Other

## 2022-04-09 VITALS — BP 151/78 | HR 71 | Ht 62.0 in | Wt 173.0 lb

## 2022-04-09 DIAGNOSIS — Z9181 History of falling: Secondary | ICD-10-CM

## 2022-04-09 DIAGNOSIS — I63312 Cerebral infarction due to thrombosis of left middle cerebral artery: Secondary | ICD-10-CM

## 2022-04-09 DIAGNOSIS — M6281 Muscle weakness (generalized): Secondary | ICD-10-CM

## 2022-04-09 DIAGNOSIS — R2689 Other abnormalities of gait and mobility: Secondary | ICD-10-CM | POA: Diagnosis not present

## 2022-04-09 DIAGNOSIS — I69190 Apraxia following nontraumatic intracerebral hemorrhage: Secondary | ICD-10-CM | POA: Diagnosis not present

## 2022-04-09 DIAGNOSIS — R2681 Unsteadiness on feet: Secondary | ICD-10-CM | POA: Diagnosis not present

## 2022-04-09 DIAGNOSIS — R482 Apraxia: Secondary | ICD-10-CM | POA: Diagnosis not present

## 2022-04-09 DIAGNOSIS — R278 Other lack of coordination: Secondary | ICD-10-CM

## 2022-04-09 DIAGNOSIS — I6521 Occlusion and stenosis of right carotid artery: Secondary | ICD-10-CM | POA: Diagnosis not present

## 2022-04-09 DIAGNOSIS — I69351 Hemiplegia and hemiparesis following cerebral infarction affecting right dominant side: Secondary | ICD-10-CM | POA: Diagnosis not present

## 2022-04-09 DIAGNOSIS — R4701 Aphasia: Secondary | ICD-10-CM | POA: Diagnosis not present

## 2022-04-09 NOTE — Patient Instructions (Addendum)
Continue working with therapies as advised for hopeful ongoing recovery  You will be called to schedule a carotid ultrasound to assess for narrowing in your carotid arteries   Continue aspirin 81 mg daily  and fenofibrate for secondary stroke prevention  Continue to follow up with PCP regarding cholesterol and blood pressure management  Maintain strict control of hypertension with blood pressure goal below 130/90 and cholesterol with LDL cholesterol (bad cholesterol) goal below 70 mg/dL.   Signs of a Stroke? Follow the BEFAST method:  Balance Watch for a sudden loss of balance, trouble with coordination or vertigo Eyes Is there a sudden loss of vision in one or both eyes? Or double vision?  Face: Ask the person to smile. Does one side of the face droop or is it numb?  Arms: Ask the person to raise both arms. Does one arm drift downward? Is there weakness or numbness of a leg? Speech: Ask the person to repeat a simple phrase. Does the speech sound slurred/strange? Is the person confused ? Time: If you observe any of these signs, call 911.        Thank you for coming to see [name] at [facility]. I hope we have been able to provide you high quality care today.  You may receive a patient satisfaction survey over the next few weeks. We would appreciate your feedback and comments so that we may continue to improve ourselves and the health of our patients.

## 2022-04-09 NOTE — Therapy (Signed)
OUTPATIENT PHYSICAL THERAPY NEURO THERAPY   Patient Name: [name] MRN: [id number] DOB:1940-11-15, 81 y.o., female Today's Date: 04/09/2022   PCP: Pt unable to provide information and unable to locate in chart. REFERRING PROVIDER: Courtney Heys, MD    PT End of Session - 04/09/22 1403     Visit Number 14    Number of Visits 25    Date for PT Re-Evaluation 05/20/22    Authorization Type [employer] Medicare    Progress Note Due on Visit 20    PT Start Time 1400    PT Stop Time 1445    PT Time Calculation (min) 45 min    Equipment Utilized During Treatment Gait belt    Activity Tolerance Patient tolerated treatment well    Behavior During Therapy WFL for tasks assessed/performed            Past Medical History:  Diagnosis Date    Multiple pulmonary nodules, largest R mid lung 06/29/2013   Followed in Pulmonary clinic/ [facility]/ [place] - see CXR 06/27/13  - CT chest 07/14/2013 > 1. No acute findings in the thorax to account for the patient's  symptoms.  2. However, there is a subsolid nodule in the   right lower lobe that has a ground-glass attenuation component  measuring 2.5 x 1.8 cm, and a small central solid component  measuring 4 mm. Initial follow-up by chest CT without    Adenocarcinoma of lung, stage 1 ([place])    Status post right lower lobectomy August 2015   Anxiety    Asthma    Atrophic vaginitis    Chronic cough 12/20/2018   Chronic iritis of left eye    Pupil stays dilated; nonreactive   Chronic kidney disease, stage 3b (HCC)    Colon polyp    Contact lens/glasses fitting    wears contacts or glasses   Cough variant asthma with component of uacs  05/31/2013   Followed in Pulmonary clinic/ [facility]/ [place] in her 38's - Spirometry 02/23/13 wnl  - 07/03/2013 Sinus CT > Mild chronic sinus disease - No acute findings. Left-to-right nasal septal deviation of 3 mm. -med calendar 08/08/13 , redone 06/01/2016 and 02/22/2017  - Eos 4%   10/13/2013  > rec  singulair daily  - FENO 05/12/2016  =   24 on singulair  - Allergy profile 05/12/2016 >   IgE  47 pos   Depression    Depression with anxiety    Dysuria 08/03/2019   Environmental allergies    Essential hypertension 02/23/2017   Changed losartan to avapro 02/22/2017 due to cough    GERD (gastroesophageal reflux disease)    Hypertension    Hypertriglyceridemia    Kidney stones    OA (osteoarthritis)    Osteopenia    PONV (postoperative nausea and vomiting)    S/P lobectomy of lung 02/12/2014   Status post total knee replacement, left 08/26/2018   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   Past Surgical History:  Procedure Laterality Date   APPENDECTOMY     COLONOSCOPY     EYE SURGERY Left    cataract removal   ORIF WRIST FRACTURE Left 08/04/2013   Procedure: OPEN REDUCTION INTERNAL FIXATION (ORIF) LEFT DISTAL RADIUS WRIST FRACTURE;  Surgeon: Wynonia Sours, MD;  Location: [place];  Service: Orthopedics;  Laterality: Left;   PARTIAL HYSTERECTOMY     TOTAL KNEE ARTHROPLASTY Left 08/26/2018   Procedure: TOTAL KNEE ARTHROPLASTY;  Surgeon: Netta Cedars, MD;  Location: [facility];  Service: Orthopedics;  Laterality: Left;   VIDEO ASSISTED THORACOSCOPY (VATS)/WEDGE RESECTION Right 02/12/2014   Procedure: RIGHT VIDEO ASSISTED THORACOSCOPY RIGHT LOWER LOBE LUNG /WEDGE RESECTION,RIGHT THORACOTOMY WITH RIGHT LOWER LOBE LOBECTOMY & NODE DISSECTION;  Surgeon: Melrose Nakayama, MD;  Location: [place];  Service: Thoracic;  Laterality: Right;   VIDEO ASSISTED THORACOSCOPY (VATS)/WEDGE RESECTION Right 02/12/2014   VIDEO BRONCHOSCOPY N/A 02/12/2014   Procedure: VIDEO BRONCHOSCOPY;  Surgeon: Melrose Nakayama, MD;  Location: [place];  Service: Thoracic;  Laterality: N/A;   Patient Active Problem List   Diagnosis Date Noted   Acute ischemic left MCA stroke ([place]) 01/28/2022   Aphasia as late effect of cerebrovascular accident (CVA) 01/28/2022   Right hemiparesis ([place]) 01/28/2022   Abnormal gait  due to muscle weakness 01/28/2022   Insomnia 10/13/2021   Frequency of urination 10/13/2021   Vitamin D deficiency 10/13/2021   Chronic renal failure 10/13/2021   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[place]) 09/24/2021   Aphasia due to acute cerebrovascular accident (CVA) ([place]) 09/21/2021   Chronic kidney disease, stage 3b ([place]) 09/21/2021   Depression with anxiety 09/21/2021   Hypertension 09/21/2021   Asthma, chronic 09/21/2021   Fall at home, initial encounter 09/21/2021   Right ankle pain 09/21/2021   Dysuria 08/03/2019   Chronic cough 12/20/2018   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   Status post total knee replacement, left 08/26/2018   Essential hypertension 02/23/2017   Adenocarcinoma of lung, stage 1 ([place]) 05/08/2014   S/P lobectomy of lung 02/12/2014   Asthma 01/30/2014    Multiple pulmonary nodules, largest R mid lung 06/29/2013   Cough variant asthma with component of uacs  05/31/2013    ONSET DATE: 01/28/2022   REFERRING DIAG: G81.91 (ICD-10-CM) - Right hemiparesis (HCC) I63.512 (ICD-10-CM) - Acute ischemic left MCA stroke (HCC)   THERAPY DIAG:  Other abnormalities of gait and mobility  History of falling  Muscle weakness (generalized)  Other lack of coordination  Rationale for Evaluation and Treatment Rehabilitation  SUBJECTIVE:                                                                                                                                                                                             SUBJECTIVE STATEMENT: Patient reports doing well. Has been walking around her house with no AD for the past few days without issue, but states she does it with someone else present.   Pt accompanied by: self   PERTINENT HISTORY: hx of hypertension, thrombotic stroke involving left middle cerebral artery, asthma, lung cancer, coronary calcification seen on CT and CKD  PAIN:  Are you having pain? No  PRECAUTIONS:  Fall  PATIENT GOALS "to be able to walk without help and without pain"  OBJECTIVE:   TODAY'S TREATMENT: -NMR:  -NuStep level 3 B LE only x10 mins -fwd/backward walking in // bars x6 laps-> resisted forward/ backward with increased LOB posteriorly, but able to regain  -ambulating with no AD working on visual scanning to retrieve numbers  -immediate standing balance on Airex x10 + CGA  PATIENT EDUCATION: Education details: continue HEP Person educated: Patient Education method: Explanation, Demonstration, and Handouts Education comprehension: verbalized understanding   HOME EXERCISE PROGRAM: Access Code: [email or web address] URL: [email or web address] Date: 02/10/2022 Prepared by: Excell Seltzer  Exercises - Side Stepping with Resistance at Ankles and Counter Support  - 1 x daily - 7 x weekly - 3 sets - 10 reps - Forward Step Up with Counter Support  - 1 x daily - 7 x weekly - 3 sets - 10 reps - Towel Scrunches  - 1 x daily - 7 x weekly - 2 sets - 10 reps - Seated Ankle Alphabet  - 1 x daily - 7 x weekly - 1 sets - 10 reps - Mini Squat with Counter Support  - 1 x daily - 7 x weekly - 3 sets - 10 reps   GOALS: Goals reviewed with patient? Yes  SHORT TERM GOALS: Target date: 03/03/2022  Initiate HEP Baseline: initiated 8/8 Goal status: MET  2.  Pt will improve gait velocity to at least 2.5 ft/sec for improved gait efficiency and performance at Supervision level with LRAD Baseline: 2.26 ft/sec S* with RW (8/1), 2.35 ft/sec (8/24) Goal status: IN PROGRESS  3.  Pt will improve normal TUG to less than or equal to 13 seconds for improved functional mobility and decreased fall risk. Baseline: 14 sec with RW (8/1), 11.91 sec with no AD (8/24) Goal status: MET  4.  Pt will improve Berg score to 40/56  for decreased fall risk Baseline: 34/56 (8/8); 37/56 on 9/6 Goal status: NOT MET  5.  Pt will improve 5 x STS to less than or equal to 12 seconds to demonstrate improved  functional strength and transfer efficiency.  Baseline: 13.8 (8/1), 15.28 sec (8/24) Goal status: IN PROGRESS   LONG TERM GOALS: Target date: 03/31/2022  Pt will be independent with balance and strengthening HEP for improved strength, balance, transfers and gait. Baseline:  Goal status: IN PROGRESS  2.  Pt will improve gait velocity to at least 3.0 ft/sec for improved gait efficiency and performance at mod I level with LRAD Baseline: 2.26 ft/sec S* with RW (8/1), 2.35 ft/sec with RW (8/24), 2.1 ft/sec with no AD (10/3) Goal status: NOT MET  3.  Pt will improve normal TUG to less than or equal to 12 seconds for improved functional mobility and decreased fall risk. Baseline: 14 sec with RW (8/1), 11.91 sec with no AD (8/24), 12.6 sec with no AD (10/3) Goal status: NOT MET  4.  Pt will improve Berg score to 46/56 for decreased fall risk Baseline: 34/56 (8/8), 40/56 (10/3) Goal status: IN PROGRESS  5.  Pt will improve 5 x STS to less than or equal to 10 seconds to demonstrate improved functional strength and transfer efficiency.  Baseline: 13.8 sec (8/1), 15.28 sec (8/24), 15.28 sec (10/3) Goal status: IN PROGRESS   LONG TERM GOALS: Target date: 05/20/2022  Pt will be independent with balance and strengthening HEP for improved strength, balance, transfers and gait. Baseline:  Goal  status: IN PROGRESS  2.  Pt will improve gait velocity to at least 3.0 ft/sec for improved gait efficiency and performance at mod I level with LRAD Baseline: 2.26 ft/sec S* with RW (8/1), 2.35 ft/sec with RW (8/24), 2.1 ft/sec with no AD (10/3) Goal status: IN PROGRESS  3.  Pt will improve normal TUG to less than or equal to 12 seconds for improved functional mobility and decreased fall risk. Baseline: 14 sec with RW (8/1), 11.91 sec with no AD (8/24), 12.6 sec with no AD (10/3) Goal status: IN PROGRESS  4.  Pt will improve Berg score to 46/56 for decreased fall risk Baseline: 34/56 (8/8), 40/56  (10/3) Goal status: IN PROGRESS  5.  Pt will improve 5 x STS to less than or equal to 13 seconds to demonstrate improved functional strength and transfer efficiency.  Baseline: 13.8 sec (8/1), 15.28 sec (8/24), 15.28 sec (10/3) Goal status: REVISED   ASSESSMENT:  CLINICAL IMPRESSION: Patient seen for skilled PT session with emphasis on LE NMR. Patient noted to have slight posterior LOB with immediate standing balance and with posterior walking, but improved with repetition. Patient demonstrating appropriate balance strategies to attempt to regain balance, but at times insufficient. Continue POC.    OBJECTIVE IMPAIRMENTS Abnormal gait, decreased balance, decreased coordination, decreased mobility, decreased strength, decreased safety awareness, and pain.   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   REHAB POTENTIAL: Good  CLINICAL DECISION MAKING: Stable/uncomplicated  EVALUATION COMPLEXITY: Moderate  PLAN: PT FREQUENCY: 2x/week  PT DURATION: 8 weeks+ 8 visits (recert)  PLANNED INTERVENTIONS: Therapeutic exercises, Therapeutic activity, Neuromuscular re-education, Balance training, Gait training, Patient/Family education, Self Care, Joint mobilization, Stair training, DME instructions, Electrical stimulation, Cryotherapy, Moist heat, Taping, Manual therapy, and Re-evaluation  PLAN FOR NEXT SESSION:  add to HEP for balance and strengthening, obstacle course, LE coordination, tandem gait, backwards gait, keep working on gait with no AD (carrying stuff with no UE  support, uneven surface)   Debbora Dus, PT, DPT Debbora Dus, PT, DPT, CBIS  04/09/2022, 3:54 PM

## 2022-04-10 ENCOUNTER — Ambulatory Visit: Payer: Medicare Other | Admitting: Physical Therapy

## 2022-04-10 ENCOUNTER — Ambulatory Visit: Payer: Medicare Other | Admitting: Speech Pathology

## 2022-04-10 DIAGNOSIS — R278 Other lack of coordination: Secondary | ICD-10-CM | POA: Diagnosis not present

## 2022-04-10 DIAGNOSIS — R2681 Unsteadiness on feet: Secondary | ICD-10-CM | POA: Diagnosis not present

## 2022-04-10 DIAGNOSIS — I69351 Hemiplegia and hemiparesis following cerebral infarction affecting right dominant side: Secondary | ICD-10-CM | POA: Diagnosis not present

## 2022-04-10 DIAGNOSIS — R2689 Other abnormalities of gait and mobility: Secondary | ICD-10-CM | POA: Diagnosis not present

## 2022-04-10 DIAGNOSIS — R41841 Cognitive communication deficit: Secondary | ICD-10-CM

## 2022-04-10 DIAGNOSIS — R4701 Aphasia: Secondary | ICD-10-CM

## 2022-04-10 DIAGNOSIS — R482 Apraxia: Secondary | ICD-10-CM | POA: Diagnosis not present

## 2022-04-10 DIAGNOSIS — M6281 Muscle weakness (generalized): Secondary | ICD-10-CM

## 2022-04-10 DIAGNOSIS — I69190 Apraxia following nontraumatic intracerebral hemorrhage: Secondary | ICD-10-CM | POA: Diagnosis not present

## 2022-04-10 DIAGNOSIS — Z9181 History of falling: Secondary | ICD-10-CM

## 2022-04-10 NOTE — Therapy (Signed)
OUTPATIENT SPEECH LANGUAGE PATHOLOGY TREATMENT   Patient Name: [name] MRN: [id number] DOB:31-Oct-1940, 81 y.o., female Today's Date: 04/10/2022  PCP: none on file REFERRING PROVIDER: Courtney Heys, MD   End of Session - 04/10/22 1241     Visit Number 12    Number of Visits 18    Date for SLP Re-Evaluation 04/28/22   +4 weeks for recert   Progress Note Due on Visit 20    SLP Start Time 1241   pt arrived late   SLP Stop Time  1315    SLP Time Calculation (min) 34 min    Activity Tolerance Patient tolerated treatment well                       Past Medical History:  Diagnosis Date    Multiple pulmonary nodules, largest R mid lung 06/29/2013   Followed in Pulmonary clinic/ [facility]/ [place] - see CXR 06/27/13  - CT chest 07/14/2013 > 1. No acute findings in the thorax to account for the patient's  symptoms.  2. However, there is a subsolid nodule in the   right lower lobe that has a ground-glass attenuation component  measuring 2.5 x 1.8 cm, and a small central solid component  measuring 4 mm. Initial follow-up by chest CT without    Adenocarcinoma of lung, stage 1 ([place])    Status post right lower lobectomy August 2015   Anxiety    Asthma    Atrophic vaginitis    Chronic cough 12/20/2018   Chronic iritis of left eye    Pupil stays dilated; nonreactive   Chronic kidney disease, stage 3b (HCC)    Colon polyp    Contact lens/glasses fitting    wears contacts or glasses   Cough variant asthma with component of uacs  05/31/2013   Followed in Pulmonary clinic/ [facility]/ [facility] in her 58's - Spirometry 02/23/13 wnl  - 07/03/2013 Sinus CT > Mild chronic sinus disease - No acute findings. Left-to-right nasal septal deviation of 3 mm. -med calendar 08/08/13 , redone 06/01/2016 and 02/22/2017  - Eos 4%   10/13/2013  > rec singulair daily  - FENO 05/12/2016  =   24 on singulair  - Allergy profile 05/12/2016 >   IgE  47 pos   Depression    Depression with  anxiety    Dysuria 08/03/2019   Environmental allergies    Essential hypertension 02/23/2017   Changed losartan to avapro 02/22/2017 due to cough    GERD (gastroesophageal reflux disease)    Hypertension    Hypertriglyceridemia    Kidney stones    OA (osteoarthritis)    Osteopenia    PONV (postoperative nausea and vomiting)    S/P lobectomy of lung 02/12/2014   Status post total knee replacement, left 08/26/2018   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   Past Surgical History:  Procedure Laterality Date   APPENDECTOMY     COLONOSCOPY     EYE SURGERY Left    cataract removal   ORIF WRIST FRACTURE Left 08/04/2013   Procedure: OPEN REDUCTION INTERNAL FIXATION (ORIF) LEFT DISTAL RADIUS WRIST FRACTURE;  Surgeon: Wynonia Sours, MD;  Location: [place];  Service: Orthopedics;  Laterality: Left;   PARTIAL HYSTERECTOMY     TOTAL KNEE ARTHROPLASTY Left 08/26/2018   Procedure: TOTAL KNEE ARTHROPLASTY;  Surgeon: Netta Cedars, MD;  Location: [facility];  Service: Orthopedics;  Laterality: Left;   VIDEO ASSISTED THORACOSCOPY (VATS)/WEDGE RESECTION  Right 02/12/2014   Procedure: RIGHT VIDEO ASSISTED THORACOSCOPY RIGHT LOWER LOBE LUNG /WEDGE RESECTION,RIGHT THORACOTOMY WITH RIGHT LOWER LOBE LOBECTOMY & NODE DISSECTION;  Surgeon: Melrose Nakayama, MD;  Location: [place];  Service: Thoracic;  Laterality: Right;   VIDEO ASSISTED THORACOSCOPY (VATS)/WEDGE RESECTION Right 02/12/2014   VIDEO BRONCHOSCOPY N/A 02/12/2014   Procedure: VIDEO BRONCHOSCOPY;  Surgeon: Melrose Nakayama, MD;  Location: [place];  Service: Thoracic;  Laterality: N/A;   Patient Active Problem List   Diagnosis Date Noted   Acute ischemic left MCA stroke ([place]) 01/28/2022   Aphasia as late effect of cerebrovascular accident (CVA) 01/28/2022   Right hemiparesis ([place]) 01/28/2022   Abnormal gait due to muscle weakness 01/28/2022   Insomnia 10/13/2021   Frequency of urination 10/13/2021   Vitamin D deficiency 10/13/2021    Chronic renal failure 10/13/2021   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[place]) 09/24/2021   Aphasia due to acute cerebrovascular accident (CVA) ([place]) 09/21/2021   Chronic kidney disease, stage 3b ([place]) 09/21/2021   Depression with anxiety 09/21/2021   Hypertension 09/21/2021   Asthma, chronic 09/21/2021   Fall at home, initial encounter 09/21/2021   Right ankle pain 09/21/2021   Dysuria 08/03/2019   Chronic cough 12/20/2018   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   Status post total knee replacement, left 08/26/2018   Essential hypertension 02/23/2017   Adenocarcinoma of lung, stage 1 ([place]) 05/08/2014   S/P lobectomy of lung 02/12/2014   Asthma 01/30/2014    Multiple pulmonary nodules, largest R mid lung 06/29/2013   Cough variant asthma with component of uacs  05/31/2013    ONSET DATE: March 2023, referral July 2023   REFERRING DIAG:  I69.320 (ICD-10-CM) - Aphasia as late effect of cerebrovascular accident (CVA)  I63.512 (ICD-10-CM) - Acute ischemic left MCA stroke (HCC)    THERAPY DIAG:  Aphasia  Apraxia  Cognitive communication deficit  Rationale for Evaluation and Treatment Rehabilitation  SUBJECTIVE:   SUBJECTIVE STATEMENT: "She checked me over and I don't have to go back unless I need her" re: neurologist visit this week  PAIN:  Are you having pain? No   OBJECTIVE:  TODAY'S TREATMENT:  04-10-22: Target written expression through Copy and Recall Treatment (CART). Initial set: Pt names 4/6 pictured targets without cueing. Min-A to achieve 100% accuracy. Pt was able to write 4/6 words independently at baseline. With occasional min-A, able to generate erroneous phonemes verbally but with some difficulty writing correct grapheme, perseveration of "t". Pt was able to copy written word (x2 each) with 100% accuracy. With written visual cues removed, the patient was able to recall and write 5/6 target words accurately on initial attempt. Corrects  errors with min verbal cues. SLP introduced 2nd set of 6 targets, with pt naming 3/6 target words initially without cueing. Pt was able to write 1/6 words independently at baseline. Is able to verbally spell x2 words, requires max-A for accurately transcribing those letters to graphemes. Benefits from spelling orally while writing to achieve x2 accurate written spellings. Updated HEP.  04-07-22: SLP provided education for use of cognitive assistive devices, carefully selected for needs and accessiblity to aid in recall of required tasks. Pt reportedly missed 1 medication dose while caregiver was gone on Sunday. Pt reports does have Alexa. Generated strategy to use daily reminder via [place] for medication administration with back up of caregivers providing verbal-A. Provided coaching for level of cueing caregiver can provide to A in increased independence in medication administration. Generated strategy  of asking for information in writing to A in recall of details of important conversations. Pt to employ with PT later this date for recall of suggestions re: ambulation at home. Pt verbalizes understand of all targeted interventions, denies questions. SLP to assess understanding next session through teach back and report of employment of strategies.   03-24-22: Target strengthening orthographic representation of phonemic targets to enable improved ability to write. SLP initiated Copy and Recall Treatment (CART) with set of 20 words to facilitate key word usage for future phonological to orthographic correspondence in self-generated written words. Pt was able to name 6/6 words without cueing. Pt was able to write 20% of pictured words independently at baseline. The patient was able to copy a written word with 76% accuracy, independently. With occasional min-A  from SLP, this improved to 100% accuracy. Self-ID of errors in approximately 50% of trials. With written visual cues removed, the patient was able to recall and  write the target words with 85% accuracy on initial attempt. Corrects errors with min verbal cues. Update HEP.    PATIENT EDUCATION: Education details: see above Person educated: Patient Education method: Customer service manager Education comprehension: verbalized understanding, returned demonstration, and needs further education   GOALS: Goals reviewed with patient? Yes  SHORT TERM GOALS: Target date: 03/03/22  Pt will verbalize 7 items in personally relevant categories with occasional min-A over 2 therapy sessions Baseline: 02-24-22; 02/26/22 Goal status: MET  2.  Pt will correct semantic paraphasia given usual mod-A in 70% of opportunities during structured task over 2 sessions.  Baseline: 02-24-22; 02-26-22 Goal status: MET  3.  Pt will write x5 item list with 80% accuracy, given usual mod-A over 2 sessions.  Baseline: 02-24-22; 02-26-22 Goal status: NOT MET  4.  Pt will successfully use trained anomia compensation in x3 opportunities across 2 therapy sessions with occasioanl min-A.  Baseline: 02-24-22; 02-26-22 Goal status: PARTIALLY MET   LONG TERM GOALS: Target date: 04/28/2022 (+4 weeks for recert)   Pt will identify, and correct, 50% of semantic paraphasias during structured task with occasional min-A over 2 sessions.  Baseline:  Goal status: MET  2.  Pt will report improvement of 2 points via CPIB PROM by d/c Baseline: 15 Goal status: IN PROGRESS  3.  Pt will carryover dysnomia compensations to conversational speech, resulting in strategy use in 70% of opportunities over 15 minute conversation Baseline:  Goal status: IN PROGRESS  4.  Pt will write x10 phrases, with mod-I, with 80% accuracy over 2 sessions.   Baseline:  Goal status: IN PROGRESS  5.  Pt will carryover compensations for increased indpendence in home based tasks, resulting in successful completion of pt selected tasks over 1 week period Baseline:  Goal status: new at recert  5.  Pt will teach  back memory compensations to aid in recall of information discussed in conversations with rare min-A Baseline:  Goal status: new at recert  ASSESSMENT:  CLINICAL IMPRESSION: Patient is a 81 y.o. F presenting with mild-moderate expressive aphasia. Moderate improvement in discourse level speech, though intermittent vague language persists. Occasional anomia, with use of anomia strategies ~50% of opportunities. Decreased instances of paraphasias with increased awareness and ability to correct. Written expression impaired, yet improving at word level. SLP has initiated skilled interventions and strategies to aid in abilites for desired functional tasks at home such as grocery list creation. SLP has initiated structured language tasks to aid in improved expressive language and enhanced communication confidence and efficacy. Additional  therapy goals added per daughter concerns re: cognition impacting successful participation in home based activities. Initiated training of external cognitive assistive device as memory aid and caregiver education for appropriate cuing for routine changes to encourage independence in pt selected task of med administration.   OBJECTIVE IMPAIRMENTS include expressive language, aphasia, and apraxia. These impairments are limiting patient from effectively communicating at home and in community. Factors affecting potential to achieve goals and functional outcome are family/community support. Patient will benefit from skilled SLP services to address above impairments and improve overall function.  REHAB POTENTIAL: Good  PLAN: SLP FREQUENCY: 2x/week  SLP DURATION: 8 weeks  PLANNED INTERVENTIONS: Language facilitation, Cueing hierachy, Internal/external aids, Functional tasks, Multimodal communication approach, SLP instruction and feedback, Compensatory strategies, and Patient/family education    Su Monks, CCC-SLP 04/10/2022, 12:42 PM

## 2022-04-10 NOTE — Therapy (Signed)
OUTPATIENT PHYSICAL THERAPY NEURO THERAPY   Patient Name: [name] MRN: [id number] DOB:[birth year], 81 y.o., female Today's Date: 04/10/2022   PCP: Pt unable to provide information and unable to locate in chart. REFERRING PROVIDER: Courtney Heys, MD    PT End of Session - 04/10/22 1319     Visit Number 15    Number of Visits 25    Date for PT Re-Evaluation 05/20/22    Authorization Type [employer] Medicare    Progress Note Due on Visit 20    PT Start Time 1319   ran over with previous session   PT Stop Time 1400    PT Time Calculation (min) 41 min    Equipment Utilized During Treatment Gait belt    Activity Tolerance Patient tolerated treatment well    Behavior During Therapy WFL for tasks assessed/performed             Past Medical History:  Diagnosis Date    Multiple pulmonary nodules, largest R mid lung 06/29/2013   Followed in Pulmonary clinic/ [facility]/ [facility] - see CXR 06/27/13  - CT chest 07/14/2013 > 1. No acute findings in the thorax to account for the patient's  symptoms.  2. However, there is a subsolid nodule in the   right lower lobe that has a ground-glass attenuation component  measuring 2.5 x 1.8 cm, and a small central solid component  measuring 4 mm. Initial follow-up by chest CT without    Adenocarcinoma of lung, stage 1 ([place])    Status post right lower lobectomy August 2015   Anxiety    Asthma    Atrophic vaginitis    Chronic cough 12/20/2018   Chronic iritis of left eye    Pupil stays dilated; nonreactive   Chronic kidney disease, stage 3b (HCC)    Colon polyp    Contact lens/glasses fitting    wears contacts or glasses   Cough variant asthma with component of uacs  05/31/2013   Followed in Pulmonary clinic/ [facility]/ [facility] in her 30's - Spirometry 02/23/13 wnl  - 07/03/2013 Sinus CT > Mild chronic sinus disease - No acute findings. Left-to-right nasal septal deviation of 3 mm. -med calendar 08/08/13 , redone 06/01/2016 and  02/22/2017  - Eos 4%   10/13/2013  > rec singulair daily  - FENO 05/12/2016  =   24 on singulair  - Allergy profile 05/12/2016 >   IgE  47 pos   Depression    Depression with anxiety    Dysuria 08/03/2019   Environmental allergies    Essential hypertension 02/23/2017   Changed losartan to avapro 02/22/2017 due to cough    GERD (gastroesophageal reflux disease)    Hypertension    Hypertriglyceridemia    Kidney stones    OA (osteoarthritis)    Osteopenia    PONV (postoperative nausea and vomiting)    S/P lobectomy of lung 02/12/2014   Status post total knee replacement, left 08/26/2018   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   Past Surgical History:  Procedure Laterality Date   APPENDECTOMY     COLONOSCOPY     EYE SURGERY Left    cataract removal   ORIF WRIST FRACTURE Left 08/04/2013   Procedure: OPEN REDUCTION INTERNAL FIXATION (ORIF) LEFT DISTAL RADIUS WRIST FRACTURE;  Surgeon: Wynonia Sours, MD;  Location: [place];  Service: Orthopedics;  Laterality: Left;   PARTIAL HYSTERECTOMY     TOTAL KNEE ARTHROPLASTY Left 08/26/2018   Procedure: TOTAL KNEE  ARTHROPLASTY;  Surgeon: Netta Cedars, MD;  Location: [facility];  Service: Orthopedics;  Laterality: Left;   VIDEO ASSISTED THORACOSCOPY (VATS)/WEDGE RESECTION Right 02/12/2014   Procedure: RIGHT VIDEO ASSISTED THORACOSCOPY RIGHT LOWER LOBE LUNG /WEDGE RESECTION,RIGHT THORACOTOMY WITH RIGHT LOWER LOBE LOBECTOMY & NODE DISSECTION;  Surgeon: Melrose Nakayama, MD;  Location: [place];  Service: Thoracic;  Laterality: Right;   VIDEO ASSISTED THORACOSCOPY (VATS)/WEDGE RESECTION Right 02/12/2014   VIDEO BRONCHOSCOPY N/A 02/12/2014   Procedure: VIDEO BRONCHOSCOPY;  Surgeon: Melrose Nakayama, MD;  Location: [place];  Service: Thoracic;  Laterality: N/A;   Patient Active Problem List   Diagnosis Date Noted   Acute ischemic left MCA stroke ([place]) 01/28/2022   Aphasia as late effect of cerebrovascular accident (CVA) 01/28/2022   Right  hemiparesis ([place]) 01/28/2022   Abnormal gait due to muscle weakness 01/28/2022   Insomnia 10/13/2021   Frequency of urination 10/13/2021   Vitamin D deficiency 10/13/2021   Chronic renal failure 10/13/2021   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[place]) 09/24/2021   Aphasia due to acute cerebrovascular accident (CVA) ([place]) 09/21/2021   Chronic kidney disease, stage 3b ([place]) 09/21/2021   Depression with anxiety 09/21/2021   Hypertension 09/21/2021   Asthma, chronic 09/21/2021   Fall at home, initial encounter 09/21/2021   Right ankle pain 09/21/2021   Dysuria 08/03/2019   Chronic cough 12/20/2018   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   Status post total knee replacement, left 08/26/2018   Essential hypertension 02/23/2017   Adenocarcinoma of lung, stage 1 ([place]) 05/08/2014   S/P lobectomy of lung 02/12/2014   Asthma 01/30/2014    Multiple pulmonary nodules, largest R mid lung 06/29/2013   Cough variant asthma with component of uacs  05/31/2013    ONSET DATE: 01/28/2022   REFERRING DIAG: G81.91 (ICD-10-CM) - Right hemiparesis (HCC) I63.512 (ICD-10-CM) - Acute ischemic left MCA stroke (HCC)   THERAPY DIAG:  Other abnormalities of gait and mobility  History of falling  Muscle weakness (generalized)  Other lack of coordination  Rationale for Evaluation and Treatment Rehabilitation  SUBJECTIVE:                                                                                                                                                                                             SUBJECTIVE STATEMENT: Pt reports she is having a sensation in her R ankle, described as a "prickly" like it fell asleep. Reports this sensation has been ongoing since her stroke, pain moves around her ankle to various parts. Pt reports she has been able to walk around her house without her walker, is being  very cautious.  Pt accompanied by: self   PERTINENT HISTORY: hx of  hypertension, thrombotic stroke involving left middle cerebral artery, asthma, lung cancer, coronary calcification seen on CT and CKD   PAIN:  Are you having pain? No  PRECAUTIONS: Fall  PATIENT GOALS "to be able to walk without help and without pain"  OBJECTIVE:   TODAY'S TREATMENT: THER ACT: Sit to stand onto airex x 10 reps with hands braced on thighs, focus on immediate standing balance due to posterior LOB upon initially standing up  Resisted sit to stand x 15 reps with green theraband  Sidesteps with gumdrop taps with min A for balance, ongoing difficulty sequencing task  Backwards gait 3 x 10 ft; 2 x 10 ft with min A for balance, pt exhibits narrow BOS and decreased step length. When pt stops and/or when pt turns to look behind her she loses balance posteriorly and needs min A to recover to prevent fall. Pt exhibits improved balance with 2nd bout of backwards gait with no LOB posteriorly, however she fatigues very quickly and urgently needs to sit due to fatigue. Decreased safety when turning to sit due to urgency. Encouraged pt to continue to use rollator at home as needed for energy conservation and for improved safety when she is feeling more fatigued.  PATIENT EDUCATION: Education details: continue HEP, use rollator at home still when more fatigued Person educated: Patient Education method: Customer service manager Education comprehension: verbalized understanding   HOME EXERCISE PROGRAM: Access Code: [email or web address] URL: [email or web address] Date: 02/10/2022 Prepared by: Excell Seltzer  Exercises - Side Stepping with Resistance at Ankles and Counter Support  - 1 x daily - 7 x weekly - 3 sets - 10 reps - Forward Step Up with Counter Support  - 1 x daily - 7 x weekly - 3 sets - 10 reps - Towel Scrunches  - 1 x daily - 7 x weekly - 2 sets - 10 reps - Seated Ankle Alphabet  - 1 x daily - 7 x weekly - 1 sets - 10 reps - Mini Squat with Counter Support  - 1 x  daily - 7 x weekly - 3 sets - 10 reps   GOALS: Goals reviewed with patient? Yes   NEW LONG TERM GOALS: Target date: 05/20/2022  Pt will be independent with balance and strengthening HEP for improved strength, balance, transfers and gait. Baseline:  Goal status: IN PROGRESS  2.  Pt will improve gait velocity to at least 3.0 ft/sec for improved gait efficiency and performance at mod I level with LRAD Baseline: 2.26 ft/sec S* with RW (8/1), 2.35 ft/sec with RW (8/24), 2.1 ft/sec with no AD (10/3) Goal status: IN PROGRESS  3.  Pt will improve normal TUG to less than or equal to 12 seconds for improved functional mobility and decreased fall risk. Baseline: 14 sec with RW (8/1), 11.91 sec with no AD (8/24), 12.6 sec with no AD (10/3) Goal status: IN PROGRESS  4.  Pt will improve Berg score to 46/56 for decreased fall risk Baseline: 34/56 (8/8), 40/56 (10/3) Goal status: IN PROGRESS  5.  Pt will improve 5 x STS to less than or equal to 13 seconds to demonstrate improved functional strength and transfer efficiency.  Baseline: 13.8 sec (8/1), 15.28 sec (8/24), 15.28 sec (10/3) Goal status: REVISED   ASSESSMENT:  CLINICAL IMPRESSION: Emphasis of skilled PT session on working on initial sit to stands, dynamic standing balance, and retro gait. Pt continues to exhibit decreased balance  with initial sit to stands, decreased balance with multi-tasking, and decreased balance with retro gait and when turning. Pt also fatigues very quickly this session with several near LOB due to urgency in regards to attempting to sit. Pt encouraged to keep using her rollator at home as needed especially with onset of fatigue. Continue POC.    OBJECTIVE IMPAIRMENTS Abnormal gait, decreased balance, decreased coordination, decreased mobility, decreased strength, decreased safety awareness, and pain.   ACTIVITY LIMITATIONS carrying, lifting, bending, squatting, stairs, transfers, bathing, dressing, and  hygiene/grooming  PARTICIPATION LIMITATIONS: meal prep, cleaning, laundry, medication management, driving, shopping, community activity, yard work, and school  PERSONAL FACTORS hx of hypertension, thrombotic stroke involving left middle cerebral artery, asthma, lung cancer, coronary calcification seen on CT and CKD  are also affecting patient's functional outcome.   REHAB POTENTIAL: Good  CLINICAL DECISION MAKING: Stable/uncomplicated  EVALUATION COMPLEXITY: Moderate  PLAN: PT FREQUENCY: 2x/week  PT DURATION: 8 weeks+ 8 visits (recert)  PLANNED INTERVENTIONS: Therapeutic exercises, Therapeutic activity, Neuromuscular re-education, Balance training, Gait training, Patient/Family education, Self Care, Joint mobilization, Stair training, DME instructions, Electrical stimulation, Cryotherapy, Moist heat, Taping, Manual therapy, and Re-evaluation  PLAN FOR NEXT SESSION:  add to HEP for balance and strengthening, obstacle course, LE coordination, tandem gait, backwards gait, keep working on gait with no AD (carrying stuff with no UE support, uneven surface), turns   Excell Seltzer, PT Excell Seltzer, PT, DPT, CSRS   04/10/2022, 2:01 PM

## 2022-04-14 ENCOUNTER — Ambulatory Visit: Payer: Medicare Other | Admitting: Occupational Therapy

## 2022-04-14 ENCOUNTER — Ambulatory Visit: Payer: Medicare Other | Admitting: Speech Pathology

## 2022-04-14 ENCOUNTER — Ambulatory Visit: Payer: Medicare Other | Admitting: Physical Therapy

## 2022-04-14 ENCOUNTER — Encounter: Payer: Self-pay | Admitting: Occupational Therapy

## 2022-04-14 DIAGNOSIS — R4701 Aphasia: Secondary | ICD-10-CM | POA: Diagnosis not present

## 2022-04-14 DIAGNOSIS — R2681 Unsteadiness on feet: Secondary | ICD-10-CM

## 2022-04-14 DIAGNOSIS — M6281 Muscle weakness (generalized): Secondary | ICD-10-CM

## 2022-04-14 DIAGNOSIS — R278 Other lack of coordination: Secondary | ICD-10-CM

## 2022-04-14 DIAGNOSIS — Z9181 History of falling: Secondary | ICD-10-CM | POA: Diagnosis not present

## 2022-04-14 DIAGNOSIS — R482 Apraxia: Secondary | ICD-10-CM

## 2022-04-14 DIAGNOSIS — R41841 Cognitive communication deficit: Secondary | ICD-10-CM

## 2022-04-14 DIAGNOSIS — R2689 Other abnormalities of gait and mobility: Secondary | ICD-10-CM

## 2022-04-14 DIAGNOSIS — I69190 Apraxia following nontraumatic intracerebral hemorrhage: Secondary | ICD-10-CM | POA: Diagnosis not present

## 2022-04-14 DIAGNOSIS — I69351 Hemiplegia and hemiparesis following cerebral infarction affecting right dominant side: Secondary | ICD-10-CM

## 2022-04-14 NOTE — Therapy (Signed)
OUTPATIENT OCCUPATIONAL THERAPY NEURO TREATMENT  Patient Name: [name] MRN: [id number] DOB:Dec 08, 1940, 81 y.o., female Today's Date: 04/14/2022  PCP: NA REFERRING PROVIDER: Courtney Heys, MD    OT End of Session - 04/14/22 1301     Visit Number 7    Number of Visits 10   6+4=10   Date for OT Re-Evaluation 06/13/22    Authorization Type UHC Medicare    Authorization - Number of Visits 7    Progress Note Due on Visit 10    OT Start Time 1317    OT Stop Time 1400    OT Time Calculation (min) 43 min    Activity Tolerance Patient tolerated treatment well    Behavior During Therapy WFL for tasks assessed/performed                Past Medical History:  Diagnosis Date    Multiple pulmonary nodules, largest R mid lung 06/29/2013   Followed in Pulmonary clinic/ [facility]/ [facility] - see CXR 06/27/13  - CT chest 07/14/2013 > 1. No acute findings in the thorax to account for the patient's  symptoms.  2. However, there is a subsolid nodule in the   right lower lobe that has a ground-glass attenuation component  measuring 2.5 x 1.8 cm, and a small central solid component  measuring 4 mm. Initial follow-up by chest CT without    Adenocarcinoma of lung, stage 1 ([place])    Status post right lower lobectomy August 2015   Anxiety    Asthma    Atrophic vaginitis    Chronic cough 12/20/2018   Chronic iritis of left eye    Pupil stays dilated; nonreactive   Chronic kidney disease, stage 3b (HCC)    Colon polyp    Contact lens/glasses fitting    wears contacts or glasses   Cough variant asthma with component of uacs  05/31/2013   Followed in Pulmonary clinic/ [facility]/ [facility] in her 73's - Spirometry 02/23/13 wnl  - 07/03/2013 Sinus CT > Mild chronic sinus disease - No acute findings. Left-to-right nasal septal deviation of 3 mm. -med calendar 08/08/13 , redone 06/01/2016 and 02/22/2017  - Eos 4%   10/13/2013  > rec singulair daily  - FENO 05/12/2016  =   24 on singulair   - Allergy profile 05/12/2016 >   IgE  47 pos   Depression    Depression with anxiety    Dysuria 08/03/2019   Environmental allergies    Essential hypertension 02/23/2017   Changed losartan to avapro 02/22/2017 due to cough    GERD (gastroesophageal reflux disease)    Hypertension    Hypertriglyceridemia    Kidney stones    OA (osteoarthritis)    Osteopenia    PONV (postoperative nausea and vomiting)    S/P lobectomy of lung 02/12/2014   Status post total knee replacement, left 08/26/2018   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   Past Surgical History:  Procedure Laterality Date   APPENDECTOMY     COLONOSCOPY     EYE SURGERY Left    cataract removal   ORIF WRIST FRACTURE Left 08/04/2013   Procedure: OPEN REDUCTION INTERNAL FIXATION (ORIF) LEFT DISTAL RADIUS WRIST FRACTURE;  Surgeon: Wynonia Sours, MD;  Location: [place];  Service: Orthopedics;  Laterality: Left;   PARTIAL HYSTERECTOMY     TOTAL KNEE ARTHROPLASTY Left 08/26/2018   Procedure: TOTAL KNEE ARTHROPLASTY;  Surgeon: Netta Cedars, MD;  [facility];  Service: Orthopedics;  Laterality: Left;   VIDEO ASSISTED THORACOSCOPY (VATS)/WEDGE RESECTION Right 02/12/2014   Procedure: RIGHT VIDEO ASSISTED THORACOSCOPY RIGHT LOWER LOBE LUNG /WEDGE RESECTION,RIGHT THORACOTOMY WITH RIGHT LOWER LOBE LOBECTOMY & NODE DISSECTION;  Surgeon: Melrose Nakayama, MD;  Location: [place];  Service: Thoracic;  Laterality: Right;   VIDEO ASSISTED THORACOSCOPY (VATS)/WEDGE RESECTION Right 02/12/2014   VIDEO BRONCHOSCOPY N/A 02/12/2014   Procedure: VIDEO BRONCHOSCOPY;  Surgeon: Melrose Nakayama, MD;  Location: [place];  Service: Thoracic;  Laterality: N/A;   Patient Active Problem List   Diagnosis Date Noted   Acute ischemic left MCA stroke ([place]) 01/28/2022   Aphasia as late effect of cerebrovascular accident (CVA) 01/28/2022   Right hemiparesis ([place]) 01/28/2022   Abnormal gait due to muscle weakness 01/28/2022   Insomnia  10/13/2021   Frequency of urination 10/13/2021   Vitamin D deficiency 10/13/2021   Chronic renal failure 10/13/2021   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[place]) 09/24/2021   Aphasia due to acute cerebrovascular accident (CVA) ([place]) 09/21/2021   Chronic kidney disease, stage 3b ([place]) 09/21/2021   Depression with anxiety 09/21/2021   Hypertension 09/21/2021   Asthma, chronic 09/21/2021   Fall at home, initial encounter 09/21/2021   Right ankle pain 09/21/2021   Dysuria 08/03/2019   Chronic cough 12/20/2018   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   Status post total knee replacement, left 08/26/2018   Essential hypertension 02/23/2017   Adenocarcinoma of lung, stage 1 ([place]) 05/08/2014   S/P lobectomy of lung 02/12/2014   Asthma 01/30/2014    Multiple pulmonary nodules, largest R mid lung 06/29/2013   Cough variant asthma with component of uacs  05/31/2013    ONSET DATE: 01/28/2022   REFERRING DIAG: G81.91 (ICD-10-CM) - Right hemiparesis (HCC) I63.512 (ICD-10-CM) - Acute ischemic left MCA stroke (HCC)   THERAPY DIAG:  Other lack of coordination  Muscle weakness (generalized)  Hemiplegia and hemiparesis following cerebral infarction affecting right dominant side (HCC)  Apraxia  Unsteadiness on feet  Rationale for Evaluation and Treatment Rehabilitation  SUBJECTIVE:   SUBJECTIVE STATEMENT: Daughter may come next week.   Pt accompanied by: self  PERTINENT HISTORY:  hx of hypertension, thrombotic stroke involving left middle cerebral artery, asthma, lung cancer, coronary calcification seen on CT and CKD    PRECAUTIONS: Fall  WEIGHT BEARING RESTRICTIONS No  PAIN:  Are you having pain? NO  FALLS: Has patient fallen in last 6 months? Yes [place] last week.  Fell backward, could not get up without help  LIVING ENVIRONMENT: Lives with: lives alone with aide/caregiver Mon-Fri Lives in: House/apartment (one level home w/ 1 step to enter)   Has  following equipment at home: Environmental consultant - 4 wheeled, Shower bench, and Grab bars  PLOF: Independent with basic ADLs  PATIENT GOALS I want to be more independent  OBJECTIVE:   Today's treatment:   Checked goals and discussed progress--see goals section below   Used rollator to carry clothes to kitchen/laundry area, placed clothes in washer, transferred to dryer, and then removed from dryer and folded, then used rollator to transport clothes back to gym area without LOB or UE support while moving clothes.   Arm bike x 38mn for reciprocal movement/conditioning without rest.     PATIENT EDUCATION: Education details: Reviewed UE yellow theraband HEP--pt performed with min cueing/demo; Reviewed Functional Home Activity Program to incr IADL participation (with supervision); Reviewed Driving Evaluation Info Person educated: Patient Education method: Explanation, Demonstration, Verbal cues, and Handouts Education comprehension: verbalized understanding and  returned demonstration   HOME EXERCISE PROGRAM: Driving Evaluation Info Issued 03/16/22:  UE yellow theraband HEP; Functional Home Activity Program to incr IADL participation (with supervision)--see pt instructions    GOALS: Goals reviewed with patient? Yes  SHORT TERM GOALS: Target date: 03/08/22  Patient will complete a home activity program designed to decrease level of care provided by paid caregivers Baseline: Goal status: Met. Per pt report 04/14/22  2.  Patient will transfer into / out of shower with distant supervision Baseline:  Goal status: Partially met.  04/14/22:  min A at home as grab bars are not installed yet, but pt performed at beach with distant supervision (zero entry)  3.  Patient will wash her back with AE without physical assistance Baseline:  Goal status: Partially met.  04/14/22:  has long handled sponge, but aide performs (pt performed without assist at the beach).     LONG TERM GOALS: Target date:  06/13/22  Patient will reduce at least 6 hours total of paid caregiver time Goal status: IN PROGRESS.  04/14/22:  has not reduced paid caregiver time  2.  Patient will demonstrate awareness of return to driving recommendations Goal status: Met.  Has been issued (reviewed 04/14/22)  3.  Patient will dress herself with modified independence including right ankle brace Goal status: MET  04/14/22 Met per pt except brace (pt hasn't be wearing).  4.  Patient will demonstrate improved stand balance as evidenced by standing without UE support during functional activities.   Goal status: IN PROGRESS, not consistent 04/14/22   5.  New for renewal:  Pt will be independent with updated functional home activity program to decr level of care provided by hired caregivers.     Goal status:  New         ASSESSMENT:  CLINICAL IMPRESSION: Pt progressing towards goals with improving balance and ADL performance.  However, as pt has not been seen for OT frequency, she would benefit from continued practice and focus on safety strategies for IADLs.  Pt limited by cognitive deficits, decr balance, and decr endurance.    PERFORMANCE DEFICITS in functional skills including coordination, dexterity, proprioception, sensation, tone, ROM, strength, flexibility, FMC, GMC, and UE functional use,   IMPAIRMENTS are limiting patient from ADLs and IADLs.   COMORBIDITIES may have co-morbidities  that affects occupational performance. Patient will benefit from skilled OT to address above impairments and improve overall function.  MODIFICATION OR ASSISTANCE TO COMPLETE EVALUATION: Min-Moderate modification of tasks or assist with assess necessary to complete an evaluation.  OT OCCUPATIONAL PROFILE AND HISTORY: Detailed assessment: Review of records and additional review of physical, cognitive, psychosocial history related to current functional performance.  CLINICAL DECISION MAKING: Moderate - several treatment  options, min-mod task modification necessary  REHAB POTENTIAL: Good  EVALUATION COMPLEXITY: Moderate   PLAN: OT FREQUENCY: 1x/week  OT DURATION:  4 sessions over 8 weeks due to scheduling needs   PLANNED INTERVENTIONS: self care/ADL training, therapeutic exercise, therapeutic activity, neuromuscular re-education, balance training, functional mobility training, aquatic therapy, patient/family education, visual/perceptual remediation/compensation, and DME and/or AE instructions  RECOMMENDED OTHER SERVICES: NA  CONSULTED AND AGREED WITH PLAN OF CARE: Patient  PLAN FOR NEXT SESSION:  renewal completed 04/14/22; practice IADLs, continue standing balance without UE support  Khaila Velarde, OTR/L 04/14/2022, 2:27 PM

## 2022-04-14 NOTE — Therapy (Signed)
OUTPATIENT PHYSICAL THERAPY NEURO THERAPY   Patient Name: [name] MRN: [id number] DOB:Apr 09, 1941, 81 y.o., female Today's Date: 04/14/2022   PCP: Pt unable to provide information and unable to locate in chart. REFERRING PROVIDER: Courtney Heys, MD    PT End of Session - 04/14/22 1146     Visit Number 16    Number of Visits 25    Date for PT Re-Evaluation 05/20/22    Authorization Type [employer] Medicare    Progress Note Due on Visit 20    PT Start Time 1145    PT Stop Time 1230    PT Time Calculation (min) 45 min    Equipment Utilized During Treatment Gait belt    Activity Tolerance Patient tolerated treatment well    Behavior During Therapy WFL for tasks assessed/performed              Past Medical History:  Diagnosis Date    Multiple pulmonary nodules, largest R mid lung 06/29/2013   Followed in Pulmonary clinic/ [facility]/ [facility] - see CXR 06/27/13  - CT chest 07/14/2013 > 1. No acute findings in the thorax to account for the patient's  symptoms.  2. However, there is a subsolid nodule in the   right lower lobe that has a ground-glass attenuation component  measuring 2.5 x 1.8 cm, and a small central solid component  measuring 4 mm. Initial follow-up by chest CT without    Adenocarcinoma of lung, stage 1 ([place])    Status post right lower lobectomy August 2015   Anxiety    Asthma    Atrophic vaginitis    Chronic cough 12/20/2018   Chronic iritis of left eye    Pupil stays dilated; nonreactive   Chronic kidney disease, stage 3b (HCC)    Colon polyp    Contact lens/glasses fitting    wears contacts or glasses   Cough variant asthma with component of uacs  05/31/2013   Followed in Pulmonary clinic/ [facility]/ [facility] in her 26's - Spirometry 02/23/13 wnl  - 07/03/2013 Sinus CT > Mild chronic sinus disease - No acute findings. Left-to-right nasal septal deviation of 3 mm. -med calendar 08/08/13 , redone 06/01/2016 and 02/22/2017  - Eos 4%   10/13/2013  >  rec singulair daily  - FENO 05/12/2016  =   24 on singulair  - Allergy profile 05/12/2016 >   IgE  47 pos   Depression    Depression with anxiety    Dysuria 08/03/2019   Environmental allergies    Essential hypertension 02/23/2017   Changed losartan to avapro 02/22/2017 due to cough    GERD (gastroesophageal reflux disease)    Hypertension    Hypertriglyceridemia    Kidney stones    OA (osteoarthritis)    Osteopenia    PONV (postoperative nausea and vomiting)    S/P lobectomy of lung 02/12/2014   Status post total knee replacement, left 08/26/2018   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   Past Surgical History:  Procedure Laterality Date   APPENDECTOMY     COLONOSCOPY     EYE SURGERY Left    cataract removal   ORIF WRIST FRACTURE Left 08/04/2013   Procedure: OPEN REDUCTION INTERNAL FIXATION (ORIF) LEFT DISTAL RADIUS WRIST FRACTURE;  Surgeon: Wynonia Sours, MD;  Location: [place];  Service: Orthopedics;  Laterality: Left;   PARTIAL HYSTERECTOMY     TOTAL KNEE ARTHROPLASTY Left 08/26/2018   Procedure: TOTAL KNEE ARTHROPLASTY;  Surgeon: Netta Cedars,  MD;  Location: [facility];  Service: Orthopedics;  Laterality: Left;   VIDEO ASSISTED THORACOSCOPY (VATS)/WEDGE RESECTION Right 02/12/2014   Procedure: RIGHT VIDEO ASSISTED THORACOSCOPY RIGHT LOWER LOBE LUNG /WEDGE RESECTION,RIGHT THORACOTOMY WITH RIGHT LOWER LOBE LOBECTOMY & NODE DISSECTION;  Surgeon: Melrose Nakayama, MD;  Location: [place];  Service: Thoracic;  Laterality: Right;   VIDEO ASSISTED THORACOSCOPY (VATS)/WEDGE RESECTION Right 02/12/2014   VIDEO BRONCHOSCOPY N/A 02/12/2014   Procedure: VIDEO BRONCHOSCOPY;  Surgeon: Melrose Nakayama, MD;  Location: [place];  Service: Thoracic;  Laterality: N/A;   Patient Active Problem List   Diagnosis Date Noted   Acute ischemic left MCA stroke ([place]) 01/28/2022   Aphasia as late effect of cerebrovascular accident (CVA) 01/28/2022   Right hemiparesis ([place]) 01/28/2022   Abnormal  gait due to muscle weakness 01/28/2022   Insomnia 10/13/2021   Frequency of urination 10/13/2021   Vitamin D deficiency 10/13/2021   Chronic renal failure 10/13/2021   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) 09/21/2021   Chronic kidney disease, stage 3b ([place]) 09/21/2021   Depression with anxiety 09/21/2021   Hypertension 09/21/2021   Asthma, chronic 09/21/2021   Fall at home, initial encounter 09/21/2021   Right ankle pain 09/21/2021   Dysuria 08/03/2019   Chronic cough 12/20/2018   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   Status post total knee replacement, left 08/26/2018   Essential hypertension 02/23/2017   Adenocarcinoma of lung, stage 1 ([place]) 05/08/2014   S/P lobectomy of lung 02/12/2014   Asthma 01/30/2014    Multiple pulmonary nodules, largest R mid lung 06/29/2013   Cough variant asthma with component of uacs  05/31/2013    ONSET DATE: 01/28/2022   REFERRING DIAG: G81.91 (ICD-10-CM) - Right hemiparesis (HCC) I63.512 (ICD-10-CM) - Acute ischemic left MCA stroke (HCC)   THERAPY DIAG:  Other abnormalities of gait and mobility  History of falling  Muscle weakness (generalized)  Other lack of coordination  Rationale for Evaluation and Treatment Rehabilitation  SUBJECTIVE:                                                                                                                                                                                             SUBJECTIVE STATEMENT: Pt reports being very sore after last session and it was difficult to move for a few days. Feeling better today and pain is improved. Pt also reports not feeling fatigued anymore today like she was last session.  Pt accompanied by: self   PERTINENT HISTORY: hx of hypertension, thrombotic stroke involving left middle cerebral artery,  asthma, lung cancer, coronary calcification seen on CT and CKD    PAIN:  Are you having pain? No  PRECAUTIONS: Fall  PATIENT GOALS "to be able to walk without help and without pain"  OBJECTIVE:   TODAY'S TREATMENT: THER ACT: Gait 2 x 115 ft while holding 15# surge with CGA for balance, minor path deviations and seated rest break needed between each bout of ambulation  Sidesteps L/R in // bars on foam beam with progression from BUE support to fingertip support, CGA needed due to posterior and lateral LOB with just fingertip support, 3 x 10 ft each direction  GAIT: Ambulation on treadmill at 0.8 mph with progression to 1.0 mph, up to 2 min at a time. Attempted to add in ball kicks but pt unable to increase step length and has difficulty sequencing task of kicking the ball while stepping. Pt exhibits decreased B step length, L>R and shuffling of feet. Pt exhibits poor ability to increase foot clearance and step length during gait on treadmill. Pt reports fatigue/soreness in her B quads following gait on treadmill.  PATIENT EDUCATION: Education details: continue HEP, use rollator at home still when more fatigued Person educated: Patient Education method: Customer service manager Education comprehension: verbalized understanding   HOME EXERCISE PROGRAM: Access Code: [email or web address] URL: [email or web address] Date: 02/10/2022 Prepared by: Excell Seltzer  Exercises - Side Stepping with Resistance at Ankles and Counter Support  - 1 x daily - 7 x weekly - 3 sets - 10 reps - Forward Step Up with Counter Support  - 1 x daily - 7 x weekly - 3 sets - 10 reps - Towel Scrunches  - 1 x daily - 7 x weekly - 2 sets - 10 reps - Seated Ankle Alphabet  - 1 x daily - 7 x weekly - 1 sets - 10 reps - Mini Squat with Counter Support  - 1 x daily - 7 x weekly - 3 sets - 10 reps   GOALS: Goals reviewed with patient? Yes   NEW LONG TERM GOALS: Target date: 05/20/2022  Pt will be independent with balance and strengthening HEP for improved strength,  balance, transfers and gait. Baseline:  Goal status: IN PROGRESS  2.  Pt will improve gait velocity to at least 3.0 ft/sec for improved gait efficiency and performance at mod I level with LRAD Baseline: 2.26 ft/sec S* with RW (8/1), 2.35 ft/sec with RW (8/24), 2.1 ft/sec with no AD (10/3) Goal status: IN PROGRESS  3.  Pt will improve normal TUG to less than or equal to 12 seconds for improved functional mobility and decreased fall risk. Baseline: 14 sec with RW (8/1), 11.91 sec with no AD (8/24), 12.6 sec with no AD (10/3) Goal status: IN PROGRESS  4.  Pt will improve Berg score to 46/56 for decreased fall risk Baseline: 34/56 (8/8), 40/56 (10/3) Goal status: IN PROGRESS  5.  Pt will improve 5 x STS to less than or equal to 13 seconds to demonstrate improved functional strength and transfer efficiency.  Baseline: 13.8 sec (8/1), 15.28 sec (8/24), 15.28 sec (10/3) Goal status: REVISED   ASSESSMENT:  CLINICAL IMPRESSION: Emphasis of skilled PT session on working on standing balance, strengthening, and improving gait mechanics. Pt continues to exhibit decreased dynamic standing balance on compliant surfaces with decreased UE support and exhibits ongoing LE shuffling and decreased LE clearance during gait especially with challenge of increased speed. Pt with several week break until next scheduled therapy session due to scheduling conflicts, encouraged  pt to keep working on her HEP and gait at home so as not to lose gains made in therapy prior to her next session. Pt does continue to benefit from skilled therapy services to address ongoing gait and balance impairments leading to increased fall risk. Continue POC.   OBJECTIVE IMPAIRMENTS Abnormal gait, decreased balance, decreased coordination, decreased mobility, decreased strength, decreased safety awareness, and pain.   ACTIVITY LIMITATIONS carrying, lifting, bending, squatting, stairs, transfers, bathing, dressing, and  hygiene/grooming  PARTICIPATION LIMITATIONS: meal prep, cleaning, laundry, medication management, driving, shopping, community activity, yard work, and school  PERSONAL FACTORS hx of hypertension, thrombotic stroke involving left middle cerebral artery, asthma, lung cancer, coronary calcification seen on CT and CKD  are also affecting patient's functional outcome.   REHAB POTENTIAL: Good  CLINICAL DECISION MAKING: Stable/uncomplicated  EVALUATION COMPLEXITY: Moderate  PLAN: PT FREQUENCY: 2x/week  PT DURATION: 8 weeks+ 8 visits (recert)  PLANNED INTERVENTIONS: Therapeutic exercises, Therapeutic activity, Neuromuscular re-education, Balance training, Gait training, Patient/Family education, Self Care, Joint mobilization, Stair training, DME instructions, Electrical stimulation, Cryotherapy, Moist heat, Taping, Manual therapy, and Re-evaluation  PLAN FOR NEXT SESSION:  add to HEP for balance and strengthening, obstacle course, LE coordination, tandem gait, backwards gait, keep working on gait with no AD (carrying stuff with no UE support, uneven surface), turns, treadmill with focus on increasing step length (can try ball kicks again)   Excell Seltzer, PT Excell Seltzer, PT, DPT, CSRS   04/14/2022, 12:33 PM

## 2022-04-14 NOTE — Therapy (Signed)
OUTPATIENT SPEECH LANGUAGE PATHOLOGY TREATMENT   Patient Name: [name] MRN: [id number] DOB:Jun 02, 1941, 81 y.o., female Today's Date: 04/14/2022  PCP: none on file REFERRING PROVIDER: Courtney Heys, MD   End of Session - 04/14/22 1155     Visit Number 13    Number of Visits 18    Date for SLP Re-Evaluation 04/28/22   +4 weeks for recert   Progress Note Due on Visit 20    SLP Start Time 7588    SLP Stop Time  1315    SLP Time Calculation (min) 45 min    Activity Tolerance Patient tolerated treatment well                       Past Medical History:  Diagnosis Date    Multiple pulmonary nodules, largest R mid lung 06/29/2013   Followed in Pulmonary clinic/ [facility]/ [place] - see CXR 06/27/13  - CT chest 07/14/2013 > 1. No acute findings in the thorax to account for the patient's  symptoms.  2. However, there is a subsolid nodule in the   right lower lobe that has a ground-glass attenuation component  measuring 2.5 x 1.8 cm, and a small central solid component  measuring 4 mm. Initial follow-up by chest CT without    Adenocarcinoma of lung, stage 1 ([place])    Status post right lower lobectomy August 2015   Anxiety    Asthma    Atrophic vaginitis    Chronic cough 12/20/2018   Chronic iritis of left eye    Pupil stays dilated; nonreactive   Chronic kidney disease, stage 3b (HCC)    Colon polyp    Contact lens/glasses fitting    wears contacts or glasses   Cough variant asthma with component of uacs  05/31/2013   [facility] in her 9's - Spirometry 02/23/13 wnl  - 07/03/2013 Sinus CT > Mild chronic sinus disease - No acute findings. Left-to-right nasal septal deviation of 3 mm. -med calendar 08/08/13 , redone 06/01/2016 and 02/22/2017  - Eos 4%   10/13/2013  > rec singulair daily  - FENO 05/12/2016  =   24 on singulair  - Allergy profile 05/12/2016 >   IgE  47 pos   Depression    Depression with anxiety     Dysuria 08/03/2019   Environmental allergies    Essential hypertension 02/23/2017   Changed losartan to avapro 02/22/2017 due to cough    GERD (gastroesophageal reflux disease)    Hypertension    Hypertriglyceridemia    Kidney stones    OA (osteoarthritis)    Osteopenia    PONV (postoperative nausea and vomiting)    S/P lobectomy of lung 02/12/2014   Status post total knee replacement, left 08/26/2018   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   Past Surgical History:  Procedure Laterality Date   APPENDECTOMY     COLONOSCOPY     EYE SURGERY Left    cataract removal   ORIF WRIST FRACTURE Left 08/04/2013   Procedure: OPEN REDUCTION INTERNAL FIXATION (ORIF) LEFT DISTAL RADIUS WRIST FRACTURE;  Surgeon: Wynonia Sours, MD;  Location: [place];  Service: Orthopedics;  Laterality: Left;   PARTIAL HYSTERECTOMY     TOTAL KNEE ARTHROPLASTY Left 08/26/2018   Procedure: TOTAL KNEE ARTHROPLASTY;  Surgeon: Netta Cedars, MD;  [facility];  Service: Orthopedics;  Laterality: Left;   VIDEO ASSISTED THORACOSCOPY (VATS)/WEDGE RESECTION Right 02/12/2014  Procedure: RIGHT VIDEO ASSISTED THORACOSCOPY RIGHT LOWER LOBE LUNG /WEDGE RESECTION,RIGHT THORACOTOMY WITH RIGHT LOWER LOBE LOBECTOMY & NODE DISSECTION;  Surgeon: Melrose Nakayama, MD;  Location: [place];  Service: Thoracic;  Laterality: Right;   VIDEO ASSISTED THORACOSCOPY (VATS)/WEDGE RESECTION Right 02/12/2014   VIDEO BRONCHOSCOPY N/A 02/12/2014   Procedure: VIDEO BRONCHOSCOPY;  Surgeon: Melrose Nakayama, MD;  Location: [place];  Service: Thoracic;  Laterality: N/A;   Patient Active Problem List   Diagnosis Date Noted   Acute ischemic left MCA stroke ([place]) 01/28/2022   Aphasia as late effect of cerebrovascular accident (CVA) 01/28/2022   Right hemiparesis ([place]) 01/28/2022   Abnormal gait due to muscle weakness 01/28/2022   Insomnia 10/13/2021   Frequency of urination 10/13/2021   Vitamin D deficiency 10/13/2021   Chronic renal  failure 10/13/2021   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[place]) 09/24/2021   Aphasia due to acute cerebrovascular accident (CVA) ([place]) 09/21/2021   Chronic kidney disease, stage 3b ([place]) 09/21/2021   Depression with anxiety 09/21/2021   Hypertension 09/21/2021   Asthma, chronic 09/21/2021   Fall at home, initial encounter 09/21/2021   Right ankle pain 09/21/2021   Dysuria 08/03/2019   Chronic cough 12/20/2018   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   Status post total knee replacement, left 08/26/2018   Essential hypertension 02/23/2017   Adenocarcinoma of lung, stage 1 ([place]) 05/08/2014   S/P lobectomy of lung 02/12/2014   Asthma 01/30/2014    Multiple pulmonary nodules, largest R mid lung 06/29/2013   Cough variant asthma with component of uacs  05/31/2013    ONSET DATE: March 2023, referral July 2023   REFERRING DIAG:  I69.320 (ICD-10-CM) - Aphasia as late effect of cerebrovascular accident (CVA)  I63.512 (ICD-10-CM) - Acute ischemic left MCA stroke (HCC)    THERAPY DIAG:  Aphasia  Cognitive communication deficit  Apraxia  Rationale for Evaluation and Treatment Rehabilitation  SUBJECTIVE:   SUBJECTIVE STATEMENT: Pt returns with HEP completed. Pt reports to taking medications by herself with use of environment placement to aid in recall.   PAIN:  Are you having pain? No   OBJECTIVE:  TODAY'S TREATMENT:  04-14-22: Target written expression through Copy and Recall Treatment (CART), remaining 12 stimuli for cornerstone phonemic to orthographic representations. Names 10/12 with rare min-A of phrase completion for remaining 2. Pt was able to write 6/12 words independently at baseline. Occasional occurrence of saying and writing mismatched letters, requires verbal-A to double check work to ID errors. Pt was able to copy each word (x2 each) with 100% accuracy given initial instruction of copying and double checking work as she progressed. With visual  cues removed, writes target words accurately 50% with occasional max-A to ID and attempt correction for errors.   04-10-22: Target written expression through Copy and Recall Treatment (CART). Initial set: Pt names 4/6 pictured targets without cueing. Min-A to achieve 100% accuracy. Pt was able to write 4/6 words independently at baseline. With occasional min-A, able to generate erroneous phonemes verbally but with some difficulty writing correct grapheme, perseveration of "t". Pt was able to copy written word (x2 each) with 100% accuracy. With written visual cues removed, the patient was able to recall and write 5/6 target words accurately on initial attempt. Corrects errors with min verbal cues. SLP introduced 2nd set of 6 targets, with pt naming 3/6 target words initially without cueing. Pt was able to write 1/6 words independently at baseline. Is able to verbally spell x2 words, requires  max-A for accurately transcribing those letters to graphemes. Benefits from spelling orally while writing to achieve x2 accurate written spellings. Updated HEP.  04-07-22: SLP provided education for use of cognitive assistive devices, carefully selected for needs and accessiblity to aid in recall of required tasks. Pt reportedly missed 1 medication dose while caregiver was gone on Sunday. Pt reports does have Alexa. Generated strategy to use daily reminder via [place] for medication administration with back up of caregivers providing verbal-A. Provided coaching for level of cueing caregiver can provide to A in increased independence in medication administration. Generated strategy of asking for information in writing to A in recall of details of important conversations. Pt to employ with PT later this date for recall of suggestions re: ambulation at home. Pt verbalizes understand of all targeted interventions, denies questions. SLP to assess understanding next session through teach back and report of employment of strategies.    03-24-22: Target strengthening orthographic representation of phonemic targets to enable improved ability to write. SLP initiated Copy and Recall Treatment (CART) with set of 20 words to facilitate key word usage for future phonological to orthographic correspondence in self-generated written words. Pt was able to name 6/6 words without cueing. Pt was able to write 20% of pictured words independently at baseline. The patient was able to copy a written word with 76% accuracy, independently. With occasional min-A  from SLP, this improved to 100% accuracy. Self-ID of errors in approximately 50% of trials. With written visual cues removed, the patient was able to recall and write the target words with 85% accuracy on initial attempt. Corrects errors with min verbal cues. Update HEP.    PATIENT EDUCATION: Education details: see above Person educated: Patient Education method: Customer service manager Education comprehension: verbalized understanding, returned demonstration, and needs further education   GOALS: Goals reviewed with patient? Yes  SHORT TERM GOALS: Target date: 03/03/22  Pt will verbalize 7 items in personally relevant categories with occasional min-A over 2 therapy sessions Baseline: 02-24-22; 02/26/22 Goal status: MET  2.  Pt will correct semantic paraphasia given usual mod-A in 70% of opportunities during structured task over 2 sessions.  Baseline: 02-24-22; 02-26-22 Goal status: MET  3.  Pt will write x5 item list with 80% accuracy, given usual mod-A over 2 sessions.  Baseline: 02-24-22; 02-26-22 Goal status: NOT MET  4.  Pt will successfully use trained anomia compensation in x3 opportunities across 2 therapy sessions with occasioanl min-A.  Baseline: 02-24-22; 02-26-22 Goal status: PARTIALLY MET   LONG TERM GOALS: Target date: 04/28/2022 (+4 weeks for recert)   Pt will identify, and correct, 50% of semantic paraphasias during structured task with occasional min-A over 2  sessions.  Baseline:  Goal status: MET  2.  Pt will report improvement of 2 points via CPIB PROM by d/c Baseline: 15 Goal status: IN PROGRESS  3.  Pt will carryover dysnomia compensations to conversational speech, resulting in strategy use in 70% of opportunities over 15 minute conversation Baseline:  Goal status: IN PROGRESS  4.  Pt will write x10 phrases, with mod-I, with 80% accuracy over 2 sessions.   Baseline:  Goal status: IN PROGRESS  5.  Pt will carryover compensations for increased indpendence in home based tasks, resulting in successful completion of pt selected tasks over 1 week period Baseline:  Goal status: new at recert  5.  Pt will teach back memory compensations to aid in recall of information discussed in conversations with rare min-A Baseline:  Goal status:  new at recert  ASSESSMENT:  CLINICAL IMPRESSION: Patient is a 81 y.o. F presenting with mild-moderate expressive aphasia. Moderate improvement in discourse level speech, though intermittent vague language persists. Occasional anomia, with use of anomia strategies ~50% of opportunities. Decreased instances of paraphasias with increased awareness and ability to correct. Written expression impaired, yet improving at word level. SLP has initiated skilled interventions and strategies to aid in abilites for desired functional tasks at home such as grocery list creation. SLP has initiated structured language tasks to aid in improved expressive language and enhanced communication confidence and efficacy. Additional therapy goals added per daughter concerns re: cognition impacting successful participation in home based activities. Initiated training of external cognitive assistive device as memory aid and caregiver education for appropriate cuing for routine changes to encourage independence in pt selected task of med administration.   OBJECTIVE IMPAIRMENTS include expressive language, aphasia, and apraxia. These impairments  are limiting patient from effectively communicating at home and in community. Factors affecting potential to achieve goals and functional outcome are family/community support. Patient will benefit from skilled SLP services to address above impairments and improve overall function.  REHAB POTENTIAL: Good  PLAN: SLP FREQUENCY: 2x/week  SLP DURATION: 8 weeks  PLANNED INTERVENTIONS: Language facilitation, Cueing hierachy, Internal/external aids, Functional tasks, Multimodal communication approach, SLP instruction and feedback, Compensatory strategies, and Patient/family education    Su Monks, CCC-SLP 04/14/2022, 11:59 AM

## 2022-04-21 ENCOUNTER — Ambulatory Visit
Admission: RE | Admit: 2022-04-21 | Discharge: 2022-04-21 | Disposition: A | Discharge status: Home / Self Care | Payer: Medicare Other | Source: Ambulatory Visit | Attending: Thoracic Surgery (Cardiothoracic Vascular Surgery) | Admitting: Thoracic Surgery (Cardiothoracic Vascular Surgery)

## 2022-04-21 ENCOUNTER — Ambulatory Visit: Payer: Medicare Other | Admitting: Speech Pathology

## 2022-04-21 ENCOUNTER — Telehealth: Payer: Self-pay | Admitting: Speech Pathology

## 2022-04-21 ENCOUNTER — Ambulatory Visit: Payer: Medicare Other | Admitting: Occupational Therapy

## 2022-04-21 ENCOUNTER — Ambulatory Visit: Payer: Medicare Other | Admitting: Thoracic Surgery (Cardiothoracic Vascular Surgery)

## 2022-04-21 VITALS — BP 138/82 | HR 72 | Resp 18 | Ht 62.0 in | Wt 176.0 lb

## 2022-04-21 DIAGNOSIS — C3490 Malignant neoplasm of unspecified part of unspecified bronchus or lung: Secondary | ICD-10-CM

## 2022-04-21 DIAGNOSIS — R911 Solitary pulmonary nodule: Secondary | ICD-10-CM | POA: Diagnosis not present

## 2022-04-21 DIAGNOSIS — R918 Other nonspecific abnormal finding of lung field: Secondary | ICD-10-CM | POA: Diagnosis not present

## 2022-04-21 DIAGNOSIS — Z85118 Personal history of other malignant neoplasm of bronchus and lung: Secondary | ICD-10-CM

## 2022-04-21 DIAGNOSIS — J439 Emphysema, unspecified: Secondary | ICD-10-CM | POA: Diagnosis not present

## 2022-04-21 DIAGNOSIS — I700 Atherosclerosis of aorta: Secondary | ICD-10-CM | POA: Diagnosis not present

## 2022-04-21 NOTE — Progress Notes (Signed)
[address]       [place] [address]             [phone]     HPI: [name] returns for a scheduled annual follow-up regarding lung nodules.  [name] is an 81 year old woman with a history of remote tobacco abuse, asthma, depression, anxiety, reflux, arthritis, obesity, stage Ia adenocarcinoma of the lung, right lower lobectomy, multiple lung nodules, and stroke.  She quit smoking over 40 years ago.  She had a right lower lobectomy for stage Ia adenocarcinoma in 2015.  She has been followed since then.  She has multiple groundglass opacities and multiple small solid nodules.  They have been stable over time.  She was last seen in our office in September 2022.  Her CT was unchanged.  In the interim since that visit she suffered a stroke about 6 months ago.  She had some issues with aphasia and right hemiparesis.  She underwent rehab.  She currently is at home.  She uses a rolling walker when out of the house but is able to get by without it at home.  Past Medical History:  Diagnosis Date    Multiple pulmonary nodules, largest R mid lung 06/29/2013   Followed in Pulmonary clinic/ [facility]/ [place] - see CXR 06/27/13  - CT chest 07/14/2013 > 1. No acute findings in the thorax to account for the patient's  symptoms.  2. However, there is a subsolid nodule in the   right lower lobe that has a ground-glass attenuation component  measuring 2.5 x 1.8 cm, and a small central solid component  measuring 4 mm. Initial follow-up by chest CT without    Adenocarcinoma of lung, stage 1 ([place])    Status post right lower lobectomy August 2015   Anxiety    Asthma    Atrophic vaginitis    Chronic cough 12/20/2018   Chronic iritis of left eye    Pupil stays dilated; nonreactive   Chronic kidney disease, stage 3b (HCC)    Colon polyp    Contact lens/glasses fitting    wears contacts or glasses   Cough variant asthma with component of uacs  05/31/2013    Followed in Pulmonary clinic/ [facility]/ [facility] in her 68's - Spirometry 02/23/13 wnl  - 07/03/2013 Sinus CT > Mild chronic sinus disease - No acute findings. Left-to-right nasal septal deviation of 3 mm. -med calendar 08/08/13 , redone 06/01/2016 and 02/22/2017  - Eos 4%   10/13/2013  > rec singulair daily  - FENO 05/12/2016  =   24 on singulair  - Allergy profile 05/12/2016 >   IgE  47 pos   Depression    Depression with anxiety    Dysuria 08/03/2019   Environmental allergies    Essential hypertension 02/23/2017   Changed losartan to avapro 02/22/2017 due to cough    GERD (gastroesophageal reflux disease)    Hypertension    Hypertriglyceridemia    Kidney stones    OA (osteoarthritis)    Osteopenia    PONV (postoperative nausea and vomiting)    S/P lobectomy of lung 02/12/2014   Status post total knee replacement, left 08/26/2018   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 10 tablet 0   amLODipine (NORVASC) 5 MG tablet 5 mg by oral route.     Ascorbic Acid (VITAMIN C) 100 MG tablet Take 1 tablet (100 mg total) by mouth daily. 30 tablet 0   aspirin EC 81 MG tablet Take 81 mg by mouth daily. Swallow whole.     Besifloxacin HCl (BESIVANCE) 0.6 % SUSP      budesonide-formoterol (SYMBICORT) 80-4.5 MCG/ACT inhaler TAKE 2 PUFFS BY MOUTH TWICE A DAY     Calcium-Magnesium-Vitamin D (CALCIUM 1200+D3 PO) Take 1 tablet by mouth daily.      chlorpheniramine (CHLOR-TRIMETON) 4 MG tablet      Cholecalciferol (VITAMIN D3 PO) Take 1 capsule by mouth daily.     dorzolamide-timolol (COSOPT) 22.3-6.8 MG/ML ophthalmic solution Place 1 drop into both eyes 2 (two) times  daily.     famotidine (PEPCID) 20 MG tablet 1 tablet at bedtime as needed     fenofibrate 160 MG tablet Take 160 mg by mouth daily.     FLUoxetine (PROZAC) 40 MG capsule 1 capsule     gabapentin (NEURONTIN) 100 MG capsule Take 1 capsule (100 mg total) by mouth 3 (three) times daily. One three times daily 270 capsule 1   losartan (COZAAR) 100 MG tablet      montelukast (SINGULAIR) 10 MG tablet Take 1 tablet (10 mg total) by mouth at bedtime. 90 tablet 0   Multiple Vitamins-Minerals (CENTRUM SILVER PO) Take 1 tablet by mouth daily.     solifenacin (VESICARE) 10 MG tablet Take 10 mg by mouth daily.     traZODone (DESYREL) 50 MG tablet Take 50 mg by mouth at bedtime.     No current facility-administered medications for this visit.    Physical Exam BP 138/82 (BP Location: Left Arm, Patient Position: Sitting)   Pulse 72   Resp 18   Ht 5\' 2"  (1.575 m)   Wt 176 lb (79.8 kg)   SpO2 97% Comment: RA  BMI 32.1 kg/m  81 year old woman in no acute distress Alert and oriented x3, speech fluent, walks with rolling walker Lungs diminished at right base but otherwise clear Cardiac regular rate and rhythm  Diagnostic Tests: CT CHEST WITHOUT CONTRAST   TECHNIQUE: Multidetector CT imaging of the chest was performed following the standard protocol without IV contrast.   RADIATION DOSE REDUCTION: This exam was performed according to the departmental dose-optimization program which includes automated exposure control, adjustment of the mA and/or kV according to patient size and/or use of iterative reconstruction technique.   COMPARISON:  Multiple prior chest CTs. The most recent is 03/25/2021   FINDINGS: Cardiovascular: The heart is normal in size. No pericardial effusion. The aorta is normal in caliber. Stable atherosclerotic calcifications. Stable coronary artery calcifications. Scattered calcifications at the aortic valve and stable mitral valve annular calcifications.    Mediastinum/Nodes: Small scattered mediastinal and hilar lymph nodes are stable. No mass or overt adenopathy. The esophagus is grossly normal.   Lungs/Pleura: Stable surgical changes from a right lower lobe lobectomy. Associated volume loss in the right hemithorax. Stable underlying emphysematous changes.   Numerous patchy ground-glass opacities appears stable and could reflect chronic inflammation. The largest area is in the right upper lobe on image 27/8 measures 27 x 23 mm. No new or progressive findings.   Multiple solid pulmonary nodules are also stable. No new or  progressive findings. Largest nodules: 5 mm right upper lobe nodule on image 33/8, 3.5 mm superior segment left lower lobe nodule on image 32/8, 4 mm right upper lobe nodule on image 45/8.   No acute overlying pulmonary process. No pleural effusion or pleural lesion.   Upper Abdomen: No significant upper abdominal findings. Cholelithiasis is noted incidentally. No hepatic or adrenal gland lesions. No upper abdominal adenopathy. Stable vascular disease.   Musculoskeletal: Stable rounded nodule in the subareolar region of the right breast since 2020. No acute bony findings or worrisome bone lesions. Remote compression fracture of T12.   IMPRESSION: 1. Stable surgical changes from a right lower lobe lobectomy. 2. Stable patchy ground-glass opacities and numerous small solid pulmonary nodules. No new or progressive findings. 3. No mediastinal or hilar mass or adenopathy. 4. Stable emphysematous changes. 5. Cholelithiasis. 6. Stable rounded nodule in the subareolar region of the right breast since 2020. 7. Aortic atherosclerosis.   Aortic Atherosclerosis (ICD10-I70.0) and Emphysema (ICD10-J43.9).     Electronically Signed   By: Marijo Sanes M.D.   On: 04/21/2022 11:00   I personally reviewed the CT images.  There is no interval change compared to her previous scans.  Impression: [name] is an  81 year old woman with a history of remote tobacco abuse, asthma, depression, anxiety, reflux, arthritis, obesity, stage Ia adenocarcinoma of the lung, right lower lobectomy, multiple lung nodules, and stroke.  She quit smoking over 40 years ago.  She now is 8 years out from a right lower lobectomy for stage Ia adenocarcinoma.  She has no evidence of recurrent disease.  Multiple pulmonary nodules/groundglass opacities-stable over time.  We will continue annual follow-up.  Plan:  Return in 1 year with CT chest  Melrose Nakayama, MD [facility] [phone]

## 2022-04-21 NOTE — Telephone Encounter (Signed)
SLP attempted to contact pt via primary phone number to inquire re: missed OT and ST appointments this date. No answer, SLP left VM and reminded pt of her upcoming ST appointment 04/23/2022 at 12:30.

## 2022-04-21 NOTE — Therapy (Deleted)
OUTPATIENT SPEECH LANGUAGE PATHOLOGY TREATMENT   Patient Name: [name] MRN: [id number] DOB:03/30/1941, 81 y.o., female Today's Date: 04/21/2022  PCP: none on file REFERRING PROVIDER: Courtney Heys, MD               Past Medical History:  Diagnosis Date    Multiple pulmonary nodules, largest R mid lung 06/29/2013   Followed in Pulmonary clinic/ [facility]/ [place] - see CXR 06/27/13  - CT chest 07/14/2013 > 1. No acute findings in the thorax to account for the patient's  symptoms.  2. However, there is a subsolid nodule in the   right lower lobe that has a ground-glass attenuation component  measuring 2.5 x 1.8 cm, and a small central solid component  measuring 4 mm. Initial follow-up by chest CT without    Adenocarcinoma of lung, stage 1 ([place])    Status post right lower lobectomy August 2015   Anxiety    Asthma    Atrophic vaginitis    Chronic cough 12/20/2018   Chronic iritis of left eye    Pupil stays dilated; nonreactive   Chronic kidney disease, stage 3b (HCC)    Colon polyp    Contact lens/glasses fitting    wears contacts or glasses   Cough variant asthma with component of uacs  05/31/2013   Followed in Pulmonary clinic/ [facility]/ [facility] in her 79's - Spirometry 02/23/13 wnl  - 07/03/2013 Sinus CT > Mild chronic sinus disease - No acute findings. Left-to-right nasal septal deviation of 3 mm. -med calendar 08/08/13 , redone 06/01/2016 and 02/22/2017  - Eos 4%   10/13/2013  > rec singulair daily  - FENO 05/12/2016  =   24 on singulair  - Allergy profile 05/12/2016 >   IgE  47 pos   Depression    Depression with anxiety    Dysuria 08/03/2019   Environmental allergies    Essential hypertension 02/23/2017   Changed losartan to avapro 02/22/2017 due to cough    GERD (gastroesophageal reflux disease)    Hypertension    Hypertriglyceridemia    Kidney stones    OA (osteoarthritis)    Osteopenia    PONV (postoperative nausea and vomiting)    S/P  lobectomy of lung 02/12/2014   Status post total knee replacement, left 08/26/2018   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   Past Surgical History:  Procedure Laterality Date   APPENDECTOMY     COLONOSCOPY     EYE SURGERY Left    cataract removal   ORIF WRIST FRACTURE Left 08/04/2013   Procedure: OPEN REDUCTION INTERNAL FIXATION (ORIF) LEFT DISTAL RADIUS WRIST FRACTURE;  Surgeon: Wynonia Sours, MD;  Location: [facility];  Service: Orthopedics;  Laterality: Left;   PARTIAL HYSTERECTOMY     TOTAL KNEE ARTHROPLASTY Left 08/26/2018   Procedure: TOTAL KNEE ARTHROPLASTY;  Surgeon: Netta Cedars, MD;  Location: [facility];  Service: Orthopedics;  Laterality: Left;   VIDEO ASSISTED THORACOSCOPY (VATS)/WEDGE RESECTION Right 02/12/2014   Procedure: RIGHT VIDEO ASSISTED THORACOSCOPY RIGHT LOWER LOBE LUNG /WEDGE RESECTION,RIGHT THORACOTOMY WITH RIGHT LOWER LOBE LOBECTOMY & NODE DISSECTION;  Surgeon: Melrose Nakayama, MD;  Location: [facility];  Service: Thoracic;  Laterality: Right;   VIDEO ASSISTED THORACOSCOPY (VATS)/WEDGE RESECTION Right 02/12/2014   VIDEO BRONCHOSCOPY N/A 02/12/2014   Procedure: VIDEO BRONCHOSCOPY;  Surgeon: Melrose Nakayama, MD;  Location: [facility];  Service: Thoracic;  Laterality: N/A;   Patient Active Problem List   Diagnosis Date Noted  Acute ischemic left MCA stroke ([place]) 01/28/2022   Aphasia as late effect of cerebrovascular accident (CVA) 01/28/2022   Right hemiparesis ([place]) 01/28/2022   Abnormal gait due to muscle weakness 01/28/2022   Insomnia 10/13/2021   Frequency of urination 10/13/2021   Vitamin D deficiency 10/13/2021   Chronic renal failure 10/13/2021   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[place]) 09/24/2021   Aphasia due to acute cerebrovascular accident (CVA) ([place]) 09/21/2021   Chronic kidney disease, stage 3b ([place]) 09/21/2021   Depression with anxiety 09/21/2021   Hypertension 09/21/2021   Asthma, chronic 09/21/2021   Fall at  home, initial encounter 09/21/2021   Right ankle pain 09/21/2021   Dysuria 08/03/2019   Chronic cough 12/20/2018   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   Status post total knee replacement, left 08/26/2018   Essential hypertension 02/23/2017   Adenocarcinoma of lung, stage 1 ([place]) 05/08/2014   S/P lobectomy of lung 02/12/2014   Asthma 01/30/2014    Multiple pulmonary nodules, largest R mid lung 06/29/2013   Cough variant asthma with component of uacs  05/31/2013    ONSET DATE: March 2023, referral July 2023   REFERRING DIAG:  E70.350 (ICD-10-CM) - Aphasia as late effect of cerebrovascular accident (CVA)  I63.512 (ICD-10-CM) - Acute ischemic left MCA stroke ([place])    THERAPY DIAG:  No diagnosis found.  Rationale for Evaluation and Treatment Rehabilitation  SUBJECTIVE:   SUBJECTIVE STATEMENT: ***  PAIN:  Are you having pain? No   OBJECTIVE:  TODAY'S TREATMENT:  04-21-22: ***  04-14-22: Target written expression through Copy and Recall Treatment (CART), remaining 12 stimuli for cornerstone phonemic to orthographic representations. Names 10/12 with rare min-A of phrase completion for remaining 2. Pt was able to write 6/12 words independently at baseline. Occasional occurrence of saying and writing mismatched letters, requires verbal-A to double check work to ID errors. Pt was able to copy each word (x2 each) with 100% accuracy given initial instruction of copying and double checking work as she progressed. With visual cues removed, writes target words accurately 50% with occasional max-A to ID and attempt correction for errors.   04-10-22: Target written expression through Copy and Recall Treatment (CART). Initial set: Pt names 4/6 pictured targets without cueing. Min-A to achieve 100% accuracy. Pt was able to write 4/6 words independently at baseline. With occasional min-A, able to generate erroneous phonemes verbally but with some difficulty writing correct grapheme,  perseveration of "t". Pt was able to copy written word (x2 each) with 100% accuracy. With written visual cues removed, the patient was able to recall and write 5/6 target words accurately on initial attempt. Corrects errors with min verbal cues. SLP introduced 2nd set of 6 targets, with pt naming 3/6 target words initially without cueing. Pt was able to write 1/6 words independently at baseline. Is able to verbally spell x2 words, requires max-A for accurately transcribing those letters to graphemes. Benefits from spelling orally while writing to achieve x2 accurate written spellings. Updated HEP.  04-07-22: SLP provided education for use of cognitive assistive devices, carefully selected for needs and accessiblity to aid in recall of required tasks. Pt reportedly missed 1 medication dose while caregiver was gone on Sunday. Pt reports does have Alexa. Generated strategy to use daily reminder via [place] for medication administration with back up of caregivers providing verbal-A. Provided coaching for level of cueing caregiver can provide to A in increased independence in medication administration. Generated strategy of asking for information in writing to  A in recall of details of important conversations. Pt to employ with PT later this date for recall of suggestions re: ambulation at home. Pt verbalizes understand of all targeted interventions, denies questions. SLP to assess understanding next session through teach back and report of employment of strategies.   03-24-22: Target strengthening orthographic representation of phonemic targets to enable improved ability to write. SLP initiated Copy and Recall Treatment (CART) with set of 20 words to facilitate key word usage for future phonological to orthographic correspondence in self-generated written words. Pt was able to name 6/6 words without cueing. Pt was able to write 20% of pictured words independently at baseline. The patient was able to copy a written word  with 76% accuracy, independently. With occasional min-A  from SLP, this improved to 100% accuracy. Self-ID of errors in approximately 50% of trials. With written visual cues removed, the patient was able to recall and write the target words with 85% accuracy on initial attempt. Corrects errors with min verbal cues. Update HEP.    PATIENT EDUCATION: Education details: see above Person educated: Patient Education method: Customer service manager Education comprehension: verbalized understanding, returned demonstration, and needs further education   GOALS: Goals reviewed with patient? Yes  SHORT TERM GOALS: Target date: 03/03/22  Pt will verbalize 7 items in personally relevant categories with occasional min-A over 2 therapy sessions Baseline: 02-24-22; 02/26/22 Goal status: MET  2.  Pt will correct semantic paraphasia given usual mod-A in 70% of opportunities during structured task over 2 sessions.  Baseline: 02-24-22; 02-26-22 Goal status: MET  3.  Pt will write x5 item list with 80% accuracy, given usual mod-A over 2 sessions.  Baseline: 02-24-22; 02-26-22 Goal status: NOT MET  4.  Pt will successfully use trained anomia compensation in x3 opportunities across 2 therapy sessions with occasioanl min-A.  Baseline: 02-24-22; 02-26-22 Goal status: PARTIALLY MET   LONG TERM GOALS: Target date: 04/28/2022 (+4 weeks for recert)   Pt will identify, and correct, 50% of semantic paraphasias during structured task with occasional min-A over 2 sessions.  Baseline:  Goal status: MET  2.  Pt will report improvement of 2 points via CPIB PROM by d/c Baseline: 15 Goal status: IN PROGRESS  3.  Pt will carryover dysnomia compensations to conversational speech, resulting in strategy use in 70% of opportunities over 15 minute conversation Baseline:  Goal status: IN PROGRESS  4.  Pt will write x10 phrases, with mod-I, with 80% accuracy over 2 sessions.   Baseline:  Goal status: IN  PROGRESS  5.  Pt will carryover compensations for increased indpendence in home based tasks, resulting in successful completion of pt selected tasks over 1 week period Baseline:  Goal status: new at recert  5.  Pt will teach back memory compensations to aid in recall of information discussed in conversations with rare min-A Baseline:  Goal status: new at recert  ASSESSMENT:  CLINICAL IMPRESSION: Patient is a 81 y.o. F presenting with mild-moderate expressive aphasia. Moderate improvement in discourse level speech, though intermittent vague language persists. Occasional anomia, with use of anomia strategies ~50% of opportunities. Decreased instances of paraphasias with increased awareness and ability to correct. Written expression impaired, yet improving at word level. SLP has initiated skilled interventions and strategies to aid in abilites for desired functional tasks at home such as grocery list creation. SLP has initiated structured language tasks to aid in improved expressive language and enhanced communication confidence and efficacy. Additional therapy goals added per daughter concerns re:  cognition impacting successful participation in home based activities. Initiated training of external cognitive assistive device as memory aid and caregiver education for appropriate cuing for routine changes to encourage independence in pt selected task of med administration.   OBJECTIVE IMPAIRMENTS include expressive language, aphasia, and apraxia. These impairments are limiting patient from effectively communicating at home and in community. Factors affecting potential to achieve goals and functional outcome are family/community support. Patient will benefit from skilled SLP services to address above impairments and improve overall function.  REHAB POTENTIAL: Good  PLAN: SLP FREQUENCY: 2x/week  SLP DURATION: 8 weeks  PLANNED INTERVENTIONS: Language facilitation, Cueing hierachy, Internal/external  aids, Functional tasks, Multimodal communication approach, SLP instruction and feedback, Compensatory strategies, and Patient/family education    Su Monks, CCC-SLP 04/21/2022, 12:21 PM

## 2022-04-22 ENCOUNTER — Ambulatory Visit (HOSPITAL_COMMUNITY): Payer: Medicare Other | Attending: Adult Health

## 2022-04-23 ENCOUNTER — Ambulatory Visit: Payer: Medicare Other | Admitting: Speech Pathology

## 2022-04-23 DIAGNOSIS — R2681 Unsteadiness on feet: Secondary | ICD-10-CM | POA: Diagnosis not present

## 2022-04-23 DIAGNOSIS — R482 Apraxia: Secondary | ICD-10-CM

## 2022-04-23 DIAGNOSIS — R2689 Other abnormalities of gait and mobility: Secondary | ICD-10-CM | POA: Diagnosis not present

## 2022-04-23 DIAGNOSIS — R4701 Aphasia: Secondary | ICD-10-CM | POA: Diagnosis not present

## 2022-04-23 DIAGNOSIS — R41841 Cognitive communication deficit: Secondary | ICD-10-CM

## 2022-04-23 DIAGNOSIS — M6281 Muscle weakness (generalized): Secondary | ICD-10-CM | POA: Diagnosis not present

## 2022-04-23 DIAGNOSIS — Z9181 History of falling: Secondary | ICD-10-CM | POA: Diagnosis not present

## 2022-04-23 DIAGNOSIS — R278 Other lack of coordination: Secondary | ICD-10-CM | POA: Diagnosis not present

## 2022-04-23 DIAGNOSIS — I69190 Apraxia following nontraumatic intracerebral hemorrhage: Secondary | ICD-10-CM | POA: Diagnosis not present

## 2022-04-23 DIAGNOSIS — I69351 Hemiplegia and hemiparesis following cerebral infarction affecting right dominant side: Secondary | ICD-10-CM | POA: Diagnosis not present

## 2022-04-23 NOTE — Therapy (Signed)
OUTPATIENT SPEECH LANGUAGE PATHOLOGY TREATMENT   Patient Name: [name] MRN: [id number] DOB:1941-06-06, 81 y.o., female Today's Date: 04/23/2022  PCP: none on file REFERRING PROVIDER: Courtney Heys, MD   End of Session - 04/23/22 1229     Visit Number 14    Number of Visits 18    Date for SLP Re-Evaluation 04/28/22   +4 weeks for recert   Progress Note Due on Visit 48    SLP Start Time 1229    SLP Stop Time  1319    SLP Time Calculation (min) 50 min    Activity Tolerance Patient tolerated treatment well                        Past Medical History:  Diagnosis Date    Multiple pulmonary nodules, largest R mid lung 06/29/2013   Followed in Pulmonary clinic/ [facility]/ [facility] - see CXR 06/27/13  - CT chest 07/14/2013 > 1. No acute findings in the thorax to account for the patient's  symptoms.  2. However, there is a subsolid nodule in the   right lower lobe that has a ground-glass attenuation component  measuring 2.5 x 1.8 cm, and a small central solid component  measuring 4 mm. Initial follow-up by chest CT without    Adenocarcinoma of lung, stage 1 ([place])    Status post right lower lobectomy August 2015   Anxiety    Asthma    Atrophic vaginitis    Chronic cough 12/20/2018   Chronic iritis of left eye    Pupil stays dilated; nonreactive   Chronic kidney disease, stage 3b (HCC)    Colon polyp    Contact lens/glasses fitting    wears contacts or glasses   Cough variant asthma with component of uacs  05/31/2013   Followed in Pulmonary clinic/  [facility] in her 53's - Spirometry 02/23/13 wnl  - 07/03/2013 Sinus CT > Mild chronic sinus disease - No acute findings. Left-to-right nasal septal deviation of 3 mm. -med calendar 08/08/13 , redone 06/01/2016 and 02/22/2017  - Eos 4%   10/13/2013  > rec singulair daily  - FENO 05/12/2016  =   24 on singulair  - Allergy profile 05/12/2016 >   IgE  47 pos   Depression    Depression with anxiety     Dysuria 08/03/2019   Environmental allergies    Essential hypertension 02/23/2017   Changed losartan to avapro 02/22/2017 due to cough    GERD (gastroesophageal reflux disease)    Hypertension    Hypertriglyceridemia    Kidney stones    OA (osteoarthritis)    Osteopenia    PONV (postoperative nausea and vomiting)    S/P lobectomy of lung 02/12/2014   Status post total knee replacement, left 08/26/2018   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   Past Surgical History:  Procedure Laterality Date   APPENDECTOMY     COLONOSCOPY     EYE SURGERY Left    cataract removal   ORIF WRIST FRACTURE Left 08/04/2013   Procedure: OPEN REDUCTION INTERNAL FIXATION (ORIF) LEFT DISTAL RADIUS WRIST FRACTURE;  Surgeon: Wynonia Sours, MD;  Location: [place];  Service: Orthopedics;  Laterality: Left;   PARTIAL HYSTERECTOMY     TOTAL KNEE ARTHROPLASTY Left 08/26/2018   Procedure: TOTAL KNEE ARTHROPLASTY;  Surgeon: Netta Cedars, MD;  Location: [facility];  Service: Orthopedics;  Laterality: Left;   VIDEO ASSISTED THORACOSCOPY (VATS)/WEDGE RESECTION Right 02/12/2014  Procedure: RIGHT VIDEO ASSISTED THORACOSCOPY RIGHT LOWER LOBE LUNG /WEDGE RESECTION,RIGHT THORACOTOMY WITH RIGHT LOWER LOBE LOBECTOMY & NODE DISSECTION;  Surgeon: Melrose Nakayama, MD;  Location: [place];  Service: Thoracic;  Laterality: Right;   VIDEO ASSISTED THORACOSCOPY (VATS)/WEDGE RESECTION Right 02/12/2014   VIDEO BRONCHOSCOPY N/A 02/12/2014   Procedure: VIDEO BRONCHOSCOPY;  Surgeon: Melrose Nakayama, MD;  Location: [place];  Service: Thoracic;  Laterality: N/A;   Patient Active Problem List   Diagnosis Date Noted   Acute ischemic left MCA stroke ([place]) 01/28/2022   Aphasia as late effect of cerebrovascular accident (CVA) 01/28/2022   Right hemiparesis ([place]) 01/28/2022   Abnormal gait due to muscle weakness 01/28/2022   Insomnia 10/13/2021   Frequency of urination 10/13/2021   Vitamin D deficiency 10/13/2021   Chronic renal  failure 10/13/2021   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[place]) 09/24/2021   Aphasia due to acute cerebrovascular accident (CVA) ([place]) 09/21/2021   Chronic kidney disease, stage 3b ([place]) 09/21/2021   Depression with anxiety 09/21/2021   Hypertension 09/21/2021   Asthma, chronic 09/21/2021   Fall at home, initial encounter 09/21/2021   Right ankle pain 09/21/2021   Dysuria 08/03/2019   Chronic cough 12/20/2018   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   Status post total knee replacement, left 08/26/2018   Essential hypertension 02/23/2017   Adenocarcinoma of lung, stage 1 ([place]) 05/08/2014   S/P lobectomy of lung 02/12/2014   Asthma 01/30/2014    Multiple pulmonary nodules, largest R mid lung 06/29/2013   Cough variant asthma with component of uacs  05/31/2013    ONSET DATE: March 2023, referral July 2023   REFERRING DIAG:  I69.320 (ICD-10-CM) - Aphasia as late effect of cerebrovascular accident (CVA)  I63.512 (ICD-10-CM) - Acute ischemic left MCA stroke (HCC)    THERAPY DIAG:  Aphasia  Cognitive communication deficit  Apraxia  Rationale for Evaluation and Treatment Rehabilitation  SUBJECTIVE:   SUBJECTIVE STATEMENT: "I didn't know"   PAIN:  Are you having pain? No   OBJECTIVE:  TODAY'S TREATMENT:  04-23-22: Pt missed appointments on Tuesday, reports did not know she had the appointments. Generated alternative schedule management strategies with A from caregivers. Pt verbalizes understanding. Daughter to f/u with caregivers for [place] of proposed strategies. Pt had not completed writing HEP, attempted to write items from grocery list with pt requiring max-A to write x2 items correctly. Discussion on potentially using technological tool voice to text. Would require training as not familiar with feature or it's use. Demonstrated accessibility modifications to phone that may aid in successful use of cell phone. Pt to return with cell phone and ipad to  next session.   04-14-22: Target written expression through Copy and Recall Treatment (CART), remaining 12 stimuli for cornerstone phonemic to orthographic representations. Names 10/12 with rare min-A of phrase completion for remaining 2. Pt was able to write 6/12 words independently at baseline. Occasional occurrence of saying and writing mismatched letters, requires verbal-A to double check work to ID errors. Pt was able to copy each word (x2 each) with 100% accuracy given initial instruction of copying and double checking work as she progressed. With visual cues removed, writes target words accurately 50% with occasional max-A to ID and attempt correction for errors.   04-10-22: Target written expression through Copy and Recall Treatment (CART). Initial set: Pt names 4/6 pictured targets without cueing. Min-A to achieve 100% accuracy. Pt was able to write 4/6 words independently at baseline. With occasional min-A, able  to generate erroneous phonemes verbally but with some difficulty writing correct grapheme, perseveration of "t". Pt was able to copy written word (x2 each) with 100% accuracy. With written visual cues removed, the patient was able to recall and write 5/6 target words accurately on initial attempt. Corrects errors with min verbal cues. SLP introduced 2nd set of 6 targets, with pt naming 3/6 target words initially without cueing. Pt was able to write 1/6 words independently at baseline. Is able to verbally spell x2 words, requires max-A for accurately transcribing those letters to graphemes. Benefits from spelling orally while writing to achieve x2 accurate written spellings. Updated HEP.  04-07-22: SLP provided education for use of cognitive assistive devices, carefully selected for needs and accessiblity to aid in recall of required tasks. Pt reportedly missed 1 medication dose while caregiver was gone on Sunday. Pt reports does have Alexa. Generated strategy to use daily reminder via [place] for  medication administration with back up of caregivers providing verbal-A. Provided coaching for level of cueing caregiver can provide to A in increased independence in medication administration. Generated strategy of asking for information in writing to A in recall of details of important conversations. Pt to employ with PT later this date for recall of suggestions re: ambulation at home. Pt verbalizes understand of all targeted interventions, denies questions. SLP to assess understanding next session through teach back and report of employment of strategies.   03-24-22: Target strengthening orthographic representation of phonemic targets to enable improved ability to write. SLP initiated Copy and Recall Treatment (CART) with set of 20 words to facilitate key word usage for future phonological to orthographic correspondence in self-generated written words. Pt was able to name 6/6 words without cueing. Pt was able to write 20% of pictured words independently at baseline. The patient was able to copy a written word with 76% accuracy, independently. With occasional min-A  from SLP, this improved to 100% accuracy. Self-ID of errors in approximately 50% of trials. With written visual cues removed, the patient was able to recall and write the target words with 85% accuracy on initial attempt. Corrects errors with min verbal cues. Update HEP.    PATIENT EDUCATION: Education details: see above Person educated: Patient Education method: Customer service manager Education comprehension: verbalized understanding, returned demonstration, and needs further education   GOALS: Goals reviewed with patient? Yes  SHORT TERM GOALS: Target date: 03/03/22  Pt will verbalize 7 items in personally relevant categories with occasional min-A over 2 therapy sessions Baseline: 02-24-22; 02/26/22 Goal status: MET  2.  Pt will correct semantic paraphasia given usual mod-A in 70% of opportunities during structured task over 2  sessions.  Baseline: 02-24-22; 02-26-22 Goal status: MET  3.  Pt will write x5 item list with 80% accuracy, given usual mod-A over 2 sessions.  Baseline: 02-24-22; 02-26-22 Goal status: NOT MET  4.  Pt will successfully use trained anomia compensation in x3 opportunities across 2 therapy sessions with occasioanl min-A.  Baseline: 02-24-22; 02-26-22 Goal status: PARTIALLY MET   LONG TERM GOALS: Target date: 04/28/2022 (+4 weeks for recert)   Pt will identify, and correct, 50% of semantic paraphasias during structured task with occasional min-A over 2 sessions.  Baseline:  Goal status: MET  2.  Pt will report improvement of 2 points via CPIB PROM by d/c Baseline: 15 Goal status: IN PROGRESS  3.  Pt will carryover dysnomia compensations to conversational speech, resulting in strategy use in 70% of opportunities over 15 minute conversation Baseline:  Goal status: IN PROGRESS  4.  Pt will write x10 phrases, with mod-I, with 80% accuracy over 2 sessions.   Baseline:  Goal status: IN PROGRESS  5.  Pt will carryover compensations for increased indpendence in home based tasks, resulting in successful completion of pt selected tasks over 1 week period Baseline:  Goal status: new at recert  5.  Pt will teach back memory compensations to aid in recall of information discussed in conversations with rare min-A Baseline:  Goal status: new at recert  ASSESSMENT:  CLINICAL IMPRESSION: Patient is a 81 y.o. F presenting with mild-moderate expressive aphasia. Moderate improvement in discourse level speech, though intermittent vague language persists. Occasional anomia, with use of anomia strategies ~50% of opportunities. Decreased instances of paraphasias with increased awareness and ability to correct. Written expression impaired, yet improving at word level. SLP has initiated skilled interventions and strategies to aid in abilites for desired functional tasks at home such as grocery list creation.  SLP has initiated structured language tasks to aid in improved expressive language and enhanced communication confidence and efficacy. Additional therapy goals added per daughter concerns re: cognition impacting successful participation in home based activities. Initiated training of external cognitive assistive device as memory aid and caregiver education for appropriate cuing for routine changes to encourage independence in pt selected task of med administration.   OBJECTIVE IMPAIRMENTS include expressive language, aphasia, and apraxia. These impairments are limiting patient from effectively communicating at home and in community. Factors affecting potential to achieve goals and functional outcome are family/community support. Patient will benefit from skilled SLP services to address above impairments and improve overall function.  REHAB POTENTIAL: Good  PLAN: SLP FREQUENCY: 2x/week  SLP DURATION: 8 weeks  PLANNED INTERVENTIONS: Language facilitation, Cueing hierachy, Internal/external aids, Functional tasks, Multimodal communication approach, SLP instruction and feedback, Compensatory strategies, and Patient/family education    Su Monks, CCC-SLP 04/23/2022, 1:22 PM

## 2022-04-28 ENCOUNTER — Ambulatory Visit: Payer: Medicare Other | Admitting: Physical Therapy

## 2022-04-28 ENCOUNTER — Ambulatory Visit: Payer: Medicare Other | Admitting: Speech Pathology

## 2022-04-28 DIAGNOSIS — R2681 Unsteadiness on feet: Secondary | ICD-10-CM | POA: Diagnosis not present

## 2022-04-28 DIAGNOSIS — I639 Cerebral infarction, unspecified: Secondary | ICD-10-CM | POA: Diagnosis not present

## 2022-04-28 DIAGNOSIS — R4701 Aphasia: Secondary | ICD-10-CM

## 2022-04-28 DIAGNOSIS — I129 Hypertensive chronic kidney disease with stage 1 through stage 4 chronic kidney disease, or unspecified chronic kidney disease: Secondary | ICD-10-CM | POA: Diagnosis not present

## 2022-04-28 DIAGNOSIS — R2689 Other abnormalities of gait and mobility: Secondary | ICD-10-CM | POA: Diagnosis not present

## 2022-04-28 DIAGNOSIS — E785 Hyperlipidemia, unspecified: Secondary | ICD-10-CM | POA: Diagnosis not present

## 2022-04-28 DIAGNOSIS — Z9181 History of falling: Secondary | ICD-10-CM | POA: Diagnosis not present

## 2022-04-28 DIAGNOSIS — N1832 Chronic kidney disease, stage 3b: Secondary | ICD-10-CM | POA: Diagnosis not present

## 2022-04-28 DIAGNOSIS — I69190 Apraxia following nontraumatic intracerebral hemorrhage: Secondary | ICD-10-CM | POA: Diagnosis not present

## 2022-04-28 DIAGNOSIS — R278 Other lack of coordination: Secondary | ICD-10-CM | POA: Diagnosis not present

## 2022-04-28 DIAGNOSIS — R41841 Cognitive communication deficit: Secondary | ICD-10-CM

## 2022-04-28 DIAGNOSIS — M6281 Muscle weakness (generalized): Secondary | ICD-10-CM | POA: Diagnosis not present

## 2022-04-28 DIAGNOSIS — I69351 Hemiplegia and hemiparesis following cerebral infarction affecting right dominant side: Secondary | ICD-10-CM | POA: Diagnosis not present

## 2022-04-28 DIAGNOSIS — R482 Apraxia: Secondary | ICD-10-CM

## 2022-04-28 NOTE — Therapy (Signed)
OUTPATIENT SPEECH LANGUAGE PATHOLOGY TREATMENT (recert)    Patient Name: [name] MRN: [id number] DOB:May 26, 1941, 81 y.o., female Today's Date: 04/28/2022  PCP: none on file REFERRING PROVIDER: Courtney Heys, MD   End of Session - 04/28/22 1234     Visit Number 15    Number of Visits 18    Date for SLP Re-Evaluation 04/30/22   for final ST d/c visit   Progress Note Due on Visit 52    SLP Start Time 1234   ST ran over with previous pt   SLP Stop Time  1315    SLP Time Calculation (min) 41 min    Activity Tolerance Patient tolerated treatment well                        Past Medical History:  Diagnosis Date    Multiple pulmonary nodules, largest R mid lung 06/29/2013   Followed in Pulmonary clinic/ [facility]/ [place] - see CXR 06/27/13  - CT chest 07/14/2013 > 1. No acute findings in the thorax to account for the patient's  symptoms.  2. However, there is a subsolid nodule in the   right lower lobe that has a ground-glass attenuation component  measuring 2.5 x 1.8 cm, and a small central solid component  measuring 4 mm. Initial follow-up by chest CT without    Adenocarcinoma of lung, stage 1 ([place])    Status post right lower lobectomy August 2015   Anxiety    Asthma    Atrophic vaginitis    Chronic cough 12/20/2018   Chronic iritis of left eye    Pupil stays dilated; nonreactive   Chronic kidney disease, stage 3b ([facility])    Colon polyp    Contact lens/glasses fitting    wears contacts or glasses   Cough variant asthma with component of uacs  05/31/2013   Followed in Pulmonary clinic/ [facility]/ [place] in her 51's - Spirometry 02/23/13 wnl  - 07/03/2013 Sinus CT > Mild chronic sinus disease - No acute findings. Left-to-right nasal septal deviation of 3 mm. -med calendar 08/08/13 , redone 06/01/2016 and 02/22/2017  - Eos 4%   10/13/2013  > rec singulair daily  - FENO 05/12/2016  =   24 on singulair  - Allergy profile 05/12/2016 >   IgE  47 pos    Depression    Depression with anxiety    Dysuria 08/03/2019   Environmental allergies    Essential hypertension 02/23/2017   Changed losartan to avapro 02/22/2017 due to cough    GERD (gastroesophageal reflux disease)    Hypertension    Hypertriglyceridemia    Kidney stones    OA (osteoarthritis)    Osteopenia    PONV (postoperative nausea and vomiting)    S/P lobectomy of lung 02/12/2014   Status post total knee replacement, left 08/26/2018   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   Past Surgical History:  Procedure Laterality Date   APPENDECTOMY     COLONOSCOPY     EYE SURGERY Left    cataract removal   ORIF WRIST FRACTURE Left 08/04/2013   Procedure: OPEN REDUCTION INTERNAL FIXATION (ORIF) LEFT DISTAL RADIUS WRIST FRACTURE;  Surgeon: Wynonia Sours, MD;  Location: [place];  Service: Orthopedics;  Laterality: Left;   PARTIAL HYSTERECTOMY     TOTAL KNEE ARTHROPLASTY Left 08/26/2018   Procedure: TOTAL KNEE ARTHROPLASTY;  Surgeon: Netta Cedars, MD;  Location: [facility];  Service: Orthopedics;  Laterality: Left;  VIDEO ASSISTED THORACOSCOPY (VATS)/WEDGE RESECTION Right 02/12/2014   Procedure: RIGHT VIDEO ASSISTED THORACOSCOPY RIGHT LOWER LOBE LUNG /WEDGE RESECTION,RIGHT THORACOTOMY WITH RIGHT LOWER LOBE LOBECTOMY & NODE DISSECTION;  Surgeon: Melrose Nakayama, MD;  Location: [place];  Service: Thoracic;  Laterality: Right;   VIDEO ASSISTED THORACOSCOPY (VATS)/WEDGE RESECTION Right 02/12/2014   VIDEO BRONCHOSCOPY N/A 02/12/2014   Procedure: VIDEO BRONCHOSCOPY;  Surgeon: Melrose Nakayama, MD;  Location: [place];  Service: Thoracic;  Laterality: N/A;   Patient Active Problem List   Diagnosis Date Noted   Acute ischemic left MCA stroke ([place]) 01/28/2022   Aphasia as late effect of cerebrovascular accident (CVA) 01/28/2022   Right hemiparesis ([place]) 01/28/2022   Abnormal gait due to muscle weakness 01/28/2022   Insomnia 10/13/2021   Frequency of urination 10/13/2021    Vitamin D deficiency 10/13/2021   Chronic renal failure 10/13/2021   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[place]) 09/24/2021   Aphasia due to acute cerebrovascular accident (CVA) ([place]) 09/21/2021   Chronic kidney disease, stage 3b ([place]) 09/21/2021   Depression with anxiety 09/21/2021   Hypertension 09/21/2021   Asthma, chronic 09/21/2021   Fall at home, initial encounter 09/21/2021   Right ankle pain 09/21/2021   Dysuria 08/03/2019   Chronic cough 12/20/2018   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   Status post total knee replacement, left 08/26/2018   Essential hypertension 02/23/2017   Adenocarcinoma of lung, stage 1 ([place]) 05/08/2014   S/P lobectomy of lung 02/12/2014   Asthma 01/30/2014    Multiple pulmonary nodules, largest R mid lung 06/29/2013   Cough variant asthma with component of uacs  05/31/2013    ONSET DATE: March 2023, referral July 2023   REFERRING DIAG:  I69.320 (ICD-10-CM) - Aphasia as late effect of cerebrovascular accident (CVA)  I63.512 (ICD-10-CM) - Acute ischemic left MCA stroke ([place])    THERAPY DIAG:  Aphasia  Cognitive communication deficit  Apraxia  Rationale for Evaluation and Treatment Rehabilitation  SUBJECTIVE:   SUBJECTIVE STATEMENT: Pt attends with son, reports stayed home alone Sunday without incident.   PAIN:  Are you having pain? No   OBJECTIVE:  TODAY'S TREATMENT:  04-28-22: Discussion on red, yellow, green activities for when alone, as metaphor for deciding safety of activities. Pt able to accurately categorize activities in 100% of trials and provide verbal reasoning for all selections. Discussion on poor HEP adherence. SLP educated on HEP should be tailored to help pt achieve her own goals. Recommend that pt ensure she understands the meaning behind all HEP. Generated strategies to aid in improved HEP completion, to include adding to daily calendar, creating routine, using visual aids, requesting verbal cues  from caregivers at designated time of day. Pt verbalizes understanding.   04-23-22: Pt missed appointments on Tuesday, reports did not know she had the appointments. Generated alternative schedule management strategies with A from caregivers. Pt verbalizes understanding. Daughter to f/u with caregivers for [place] of proposed strategies. Pt had not completed writing HEP, attempted to write items from grocery list with pt requiring max-A to write x2 items correctly. Discussion on potentially using technological tool voice to text. Would require training as not familiar with feature or it's use. Demonstrated accessibility modifications to phone that may aid in successful use of cell phone. Pt to return with cell phone and ipad to next session.   04-14-22: Target written expression through Copy and Recall Treatment (CART), remaining 12 stimuli for cornerstone phonemic to orthographic representations. Names 10/12 with rare min-A of  phrase completion for remaining 2. Pt was able to write 6/12 words independently at baseline. Occasional occurrence of saying and writing mismatched letters, requires verbal-A to double check work to ID errors. Pt was able to copy each word (x2 each) with 100% accuracy given initial instruction of copying and double checking work as she progressed. With visual cues removed, writes target words accurately 50% with occasional max-A to ID and attempt correction for errors.   04-10-22: Target written expression through Copy and Recall Treatment (CART). Initial set: Pt names 4/6 pictured targets without cueing. Min-A to achieve 100% accuracy. Pt was able to write 4/6 words independently at baseline. With occasional min-A, able to generate erroneous phonemes verbally but with some difficulty writing correct grapheme, perseveration of "t". Pt was able to copy written word (x2 each) with 100% accuracy. With written visual cues removed, the patient was able to recall and write 5/6 target words  accurately on initial attempt. Corrects errors with min verbal cues. SLP introduced 2nd set of 6 targets, with pt naming 3/6 target words initially without cueing. Pt was able to write 1/6 words independently at baseline. Is able to verbally spell x2 words, requires max-A for accurately transcribing those letters to graphemes. Benefits from spelling orally while writing to achieve x2 accurate written spellings. Updated HEP.  04-07-22: SLP provided education for use of cognitive assistive devices, carefully selected for needs and accessiblity to aid in recall of required tasks. Pt reportedly missed 1 medication dose while caregiver was gone on Sunday. Pt reports does have Alexa. Generated strategy to use daily reminder via [place] for medication administration with back up of caregivers providing verbal-A. Provided coaching for level of cueing caregiver can provide to A in increased independence in medication administration. Generated strategy of asking for information in writing to A in recall of details of important conversations. Pt to employ with PT later this date for recall of suggestions re: ambulation at home. Pt verbalizes understand of all targeted interventions, denies questions. SLP to assess understanding next session through teach back and report of employment of strategies.   03-24-22: Target strengthening orthographic representation of phonemic targets to enable improved ability to write. SLP initiated Copy and Recall Treatment (CART) with set of 20 words to facilitate key word usage for future phonological to orthographic correspondence in self-generated written words. Pt was able to name 6/6 words without cueing. Pt was able to write 20% of pictured words independently at baseline. The patient was able to copy a written word with 76% accuracy, independently. With occasional min-A  from SLP, this improved to 100% accuracy. Self-ID of errors in approximately 50% of trials. With written visual cues  removed, the patient was able to recall and write the target words with 85% accuracy on initial attempt. Corrects errors with min verbal cues. Update HEP.    PATIENT EDUCATION: Education details: see above Person educated: Patient Education method: Customer service manager Education comprehension: verbalized understanding, returned demonstration, and needs further education   GOALS: Goals reviewed with patient? Yes  SHORT TERM GOALS: Target date: 03/03/22  Pt will verbalize 7 items in personally relevant categories with occasional min-A over 2 therapy sessions Baseline: 02-24-22; 02/26/22 Goal status: MET  2.  Pt will correct semantic paraphasia given usual mod-A in 70% of opportunities during structured task over 2 sessions.  Baseline: 02-24-22; 02-26-22 Goal status: MET  3.  Pt will write x5 item list with 80% accuracy, given usual mod-A over 2 sessions.  Baseline: 02-24-22; 02-26-22  Goal status: NOT MET  4.  Pt will successfully use trained anomia compensation in x3 opportunities across 2 therapy sessions with occasioanl min-A.  Baseline: 02-24-22; 02-26-22 Goal status: PARTIALLY MET   LONG TERM GOALS: Target date: 04/28/2022 (+4 weeks for recert)   Pt will identify, and correct, 50% of semantic paraphasias during structured task with occasional min-A over 2 sessions.  Baseline:  Goal status: MET  2.  Pt will report improvement of 2 points via CPIB PROM by d/c Baseline: 15 Goal status: IN PROGRESS  3.  Pt will carryover dysnomia compensations to conversational speech, resulting in strategy use in 70% of opportunities over 15 minute conversation Baseline: 04-28-22 Goal status: partially met  4.  Pt will write x10 phrases, with mod-I, with 80% accuracy over 2 sessions.   Baseline:  Goal status: not met  5.  Pt will carryover compensations for increased indpendence in home based tasks, resulting in successful completion of pt selected tasks over 1 week period Baseline:  04-28-22  Goal status: met  5.  Pt will teach back memory compensations to aid in recall of information discussed in conversations with rare min-A Baseline:   Goal status: new at recert  ASSESSMENT:  CLINICAL IMPRESSION: Patient is a 81 y.o. F presenting with mild-moderate expressive aphasia. Moderate improvement in discourse level speech, though intermittent vague language persists. Occasional anomia, with use of anomia strategies ~50% of opportunities. Decreased instances of paraphasias with increased awareness and ability to correct. Written expression impaired, yet improving at word level. SLP has initiated skilled interventions and strategies to aid in abilites for desired functional tasks at home such as grocery list creation. SLP has initiated structured language tasks to aid in improved expressive language and enhanced communication confidence and efficacy. Additional therapy goals added per daughter concerns re: cognition impacting successful participation in home based activities. Initiated training of external cognitive assistive device as memory aid and caregiver education for appropriate cuing for routine changes to encourage independence in pt selected task of med administration.   OBJECTIVE IMPAIRMENTS include expressive language, aphasia, and apraxia. These impairments are limiting patient from effectively communicating at home and in community. Factors affecting potential to achieve goals and functional outcome are family/community support. Patient will benefit from skilled SLP services to address above impairments and improve overall function.  REHAB POTENTIAL: Good  PLAN: SLP FREQUENCY: 2x/week  SLP DURATION: 8 weeks  PLANNED INTERVENTIONS: Language facilitation, Cueing hierachy, Internal/external aids, Functional tasks, Multimodal communication approach, SLP instruction and feedback, Compensatory strategies, and Patient/family education    Su Monks,  CCC-SLP 04/28/2022, 1:27 PM

## 2022-04-28 NOTE — Therapy (Signed)
OUTPATIENT PHYSICAL THERAPY NEURO THERAPY   Patient Name: [name] MRN: [id number] DOB:[birth year], 81 y.o., female Today's Date: 04/28/2022   PCP: Pt unable to provide information and unable to locate in chart. REFERRING PROVIDER: Courtney Heys, MD    PT End of Session - 04/28/22 1318     Visit Number 17    Number of Visits 25    Date for PT Re-Evaluation 05/20/22    Authorization Type [employer]    Progress Note Due on Visit 44    PT Start Time 1316    PT Stop Time 1351   Pt requesting to leave earlier due to another Dr appointment.   PT Time Calculation (min) 35 min    Equipment Utilized During Treatment Gait belt    Activity Tolerance Patient tolerated treatment well    Behavior During Therapy WFL for tasks assessed/performed               Past Medical History:  Diagnosis Date    Multiple pulmonary nodules, largest R mid lung 06/29/2013   Followed in Pulmonary clinic/ [facility]/ [place] - see CXR 06/27/13  - CT chest 07/14/2013 > 1. No acute findings in the thorax to account for the patient's  symptoms.  2. However, there is a subsolid nodule in the   right lower lobe that has a ground-glass attenuation component  measuring 2.5 x 1.8 cm, and a small central solid component  measuring 4 mm. Initial follow-up by chest CT without    Adenocarcinoma of lung, stage 1 ([place])    Status post right lower lobectomy August 2015   Anxiety    Asthma    Atrophic vaginitis    Chronic cough 12/20/2018   Chronic iritis of left eye    Pupil stays dilated; nonreactive   Chronic kidney disease, stage 3b (HCC)    Colon polyp    Contact lens/glasses fitting    wears contacts or glasses   Cough variant asthma with component of uacs  05/31/2013   Followed in Pulmonary clinic/ [facility]/ [place] in her 37's - Spirometry 02/23/13 wnl  - 07/03/2013 Sinus CT > Mild chronic sinus disease - No acute findings. Left-to-right nasal septal deviation of 3 mm. -med calendar  08/08/13 , redone 06/01/2016 and 02/22/2017  - Eos 4%   10/13/2013  > rec singulair daily  - FENO 05/12/2016  =   24 on singulair  - Allergy profile 05/12/2016 >   IgE  47 pos   Depression    Depression with anxiety    Dysuria 08/03/2019   Environmental allergies    Essential hypertension 02/23/2017   Changed losartan to avapro 02/22/2017 due to cough    GERD (gastroesophageal reflux disease)    Hypertension    Hypertriglyceridemia    Kidney stones    OA (osteoarthritis)    Osteopenia    PONV (postoperative nausea and vomiting)    S/P lobectomy of lung 02/12/2014   Status post total knee replacement, left 08/26/2018   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   Past Surgical History:  Procedure Laterality Date   APPENDECTOMY     COLONOSCOPY     EYE SURGERY Left    cataract removal   ORIF WRIST FRACTURE Left 08/04/2013   Procedure: OPEN REDUCTION INTERNAL FIXATION (ORIF) LEFT DISTAL RADIUS WRIST FRACTURE;  Surgeon: Wynonia Sours, MD;  Location: [place];  Service: Orthopedics;  Laterality: Left;   PARTIAL HYSTERECTOMY     TOTAL KNEE ARTHROPLASTY  Left 08/26/2018   Procedure: TOTAL KNEE ARTHROPLASTY;  Surgeon: Netta Cedars, MD;  Location: [facility];  Service: Orthopedics;  Laterality: Left;   VIDEO ASSISTED THORACOSCOPY (VATS)/WEDGE RESECTION Right 02/12/2014   Procedure: RIGHT VIDEO ASSISTED THORACOSCOPY RIGHT LOWER LOBE LUNG /WEDGE RESECTION,RIGHT THORACOTOMY WITH RIGHT LOWER LOBE LOBECTOMY & NODE DISSECTION;  Surgeon: Melrose Nakayama, MD;  Location: [place];  Service: Thoracic;  Laterality: Right;   VIDEO ASSISTED THORACOSCOPY (VATS)/WEDGE RESECTION Right 02/12/2014   VIDEO BRONCHOSCOPY N/A 02/12/2014   Procedure: VIDEO BRONCHOSCOPY;  Surgeon: Melrose Nakayama, MD;  Location: [place];  Service: Thoracic;  Laterality: N/A;   Patient Active Problem List   Diagnosis Date Noted   Acute ischemic left MCA stroke ([place]) 01/28/2022   Aphasia as late effect of cerebrovascular accident  (CVA) 01/28/2022   Right hemiparesis ([place]) 01/28/2022   Abnormal gait due to muscle weakness 01/28/2022   Insomnia 10/13/2021   Frequency of urination 10/13/2021   Vitamin D deficiency 10/13/2021   Chronic renal failure 10/13/2021   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[place]) 09/24/2021   Aphasia due to acute cerebrovascular accident (CVA) ([place]) 09/21/2021   Chronic kidney disease, stage 3b ([place]) 09/21/2021   Depression with anxiety 09/21/2021   Hypertension 09/21/2021   Asthma, chronic 09/21/2021   Fall at home, initial encounter 09/21/2021   Right ankle pain 09/21/2021   Dysuria 08/03/2019   Chronic cough 12/20/2018   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   Status post total knee replacement, left 08/26/2018   Essential hypertension 02/23/2017   Adenocarcinoma of lung, stage 1 ([place]) 05/08/2014   S/P lobectomy of lung 02/12/2014   Asthma 01/30/2014    Multiple pulmonary nodules, largest R mid lung 06/29/2013   Cough variant asthma with component of uacs  05/31/2013    ONSET DATE: 01/28/2022   REFERRING DIAG: G81.91 (ICD-10-CM) - Right hemiparesis (HCC) I63.512 (ICD-10-CM) - Acute ischemic left MCA stroke (HCC)   THERAPY DIAG:  Other abnormalities of gait and mobility  History of falling  Muscle weakness (generalized)  Rationale for Evaluation and Treatment Rehabilitation  SUBJECTIVE:                                                                                                                                                                                             SUBJECTIVE STATEMENT: Pt reports her beach trip "wasn't what I hoped it would be". Pt ambulated into clinic holding onto son, as they forgot her walker at home. No pain, has not been her HEP. "I am lazy".   Pt accompanied by: self   PERTINENT HISTORY: hx of hypertension, thrombotic  stroke involving left middle cerebral artery, asthma, lung cancer, coronary calcification seen on CT and  CKD   PAIN:  Are you having pain? No  PRECAUTIONS: Fall  PATIENT GOALS "to be able to walk without help and without pain"  OBJECTIVE:   TODAY'S TREATMENT: Ther Act  Entirety of session spent educating pt and family on importance of performing HEP and using walker at all times at home. Pt reports she has not performed her HEP at all and did not remember any of the exercises on her sheet. Pt reports she stopped using an AD inside her home about a month ago but relies heavily on furniture walking to get around her home. Informed pt and son on the safety risks behind this and strongly encouraged pt to use her walker at all times at home, but pt refuses. Pt states she has a cane at home [facility] w/quad tip) so trialed walking 115' inside clinic w/cane, but pt required min-mod A to safely use cane due to poor foot placement, improper use of AD and poor immediate standing balance. Will continue to recommend pt use walker at all times.  Reviewed HEP w/pt and son and encouraged them to implement exercise into pt's daily schedule. Son reported he would make sure pt performed them, but informed pt it was her responsibility to perform her HEP in order to reach her personal goals.   PATIENT EDUCATION: Education details: continue HEP, use of rollator at home at all times  Person educated: Patient and Child(ren) Education method: Explanation and Demonstration Education comprehension: verbalized understanding and needs further education   HOME EXERCISE PROGRAM: Access Code: [email or web address] URL: [email or web address] Date: 02/10/2022 Prepared by: Excell Seltzer  Exercises - Side Stepping with Resistance at Ankles and Counter Support  - 1 x daily - 7 x weekly - 3 sets - 10 reps - Forward Step Up with Counter Support  - 1 x daily - 7 x weekly - 3 sets - 10 reps - Towel Scrunches  - 1 x daily - 7 x weekly - 2 sets - 10 reps - Seated Ankle Alphabet  - 1 x daily - 7 x weekly - 1 sets - 10 reps - Mini  Squat with Counter Support  - 1 x daily - 7 x weekly - 3 sets - 10 reps   GOALS: Goals reviewed with patient? Yes   NEW LONG TERM GOALS: Target date: 05/20/2022  Pt will be independent with balance and strengthening HEP for improved strength, balance, transfers and gait. Baseline:  Goal status: IN PROGRESS  2.  Pt will improve gait velocity to at least 3.0 ft/sec for improved gait efficiency and performance at mod I level with LRAD Baseline: 2.26 ft/sec S* with RW (8/1), 2.35 ft/sec with RW (8/24), 2.1 ft/sec with no AD (10/3) Goal status: IN PROGRESS  3.  Pt will improve normal TUG to less than or equal to 12 seconds for improved functional mobility and decreased fall risk. Baseline: 14 sec with RW (8/1), 11.91 sec with no AD (8/24), 12.6 sec with no AD (10/3) Goal status: IN PROGRESS  4.  Pt will improve Berg score to 46/56 for decreased fall risk Baseline: 34/56 (8/8), 40/56 (10/3) Goal status: IN PROGRESS  5.  Pt will improve 5 x STS to less than or equal to 13 seconds to demonstrate improved functional strength and transfer efficiency.  Baseline: 13.8 sec (8/1), 15.28 sec (8/24), 15.28 sec (10/3) Goal status: REVISED   ASSESSMENT:  CLINICAL IMPRESSION: Emphasis of  skilled PT session on pt and family education. Pt reports she has never performed her HEP and is not performing walking program consistently. Pt also reports she is not using her walker at all in her house and relies heavily on furniture walking to get around. Educated pt and son on safety risks associated with this and encouraged use of rollator at all times, but pt refusing as this is "setting her back". Informed pt that reducing her fall risk is the priority, as another fall could severely push her back, but pt states if she falls it "will be on the couch". Very poor carryover noted throughout session, also noted by SLP. Continue POC.     OBJECTIVE IMPAIRMENTS Abnormal gait, decreased balance, decreased  coordination, decreased mobility, decreased strength, decreased safety awareness, and pain.   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   REHAB POTENTIAL: Good  CLINICAL DECISION MAKING: Stable/uncomplicated  EVALUATION COMPLEXITY: Moderate  PLAN: PT FREQUENCY: 2x/week  PT DURATION: 8 weeks+ 8 visits (recert)  PLANNED INTERVENTIONS: Therapeutic exercises, Therapeutic activity, Neuromuscular re-education, Balance training, Gait training, Patient/Family education, Self Care, Joint mobilization, Stair training, DME instructions, Electrical stimulation, Cryotherapy, Moist heat, Taping, Manual therapy, and Re-evaluation  PLAN FOR NEXT SESSION:  Did pt do HEP? Is she using rollator? add to HEP for balance and strengthening, obstacle course, LE coordination, tandem gait, backwards gait, keep working on gait with no AD (carrying stuff with no UE support, uneven surface), turns, treadmill with focus on increasing step length (can try ball kicks again)   Cruzita Lederer Jennye Runquist, PT, DPT 04/28/2022, 1:52 PM

## 2022-04-30 ENCOUNTER — Ambulatory Visit: Payer: Medicare Other

## 2022-04-30 ENCOUNTER — Ambulatory Visit: Payer: Medicare Other | Admitting: Speech Pathology

## 2022-04-30 DIAGNOSIS — I69190 Apraxia following nontraumatic intracerebral hemorrhage: Secondary | ICD-10-CM | POA: Diagnosis not present

## 2022-04-30 DIAGNOSIS — Z9181 History of falling: Secondary | ICD-10-CM | POA: Diagnosis not present

## 2022-04-30 DIAGNOSIS — R41841 Cognitive communication deficit: Secondary | ICD-10-CM

## 2022-04-30 DIAGNOSIS — I69351 Hemiplegia and hemiparesis following cerebral infarction affecting right dominant side: Secondary | ICD-10-CM | POA: Diagnosis not present

## 2022-04-30 DIAGNOSIS — M6281 Muscle weakness (generalized): Secondary | ICD-10-CM

## 2022-04-30 DIAGNOSIS — R2689 Other abnormalities of gait and mobility: Secondary | ICD-10-CM

## 2022-04-30 DIAGNOSIS — R278 Other lack of coordination: Secondary | ICD-10-CM | POA: Diagnosis not present

## 2022-04-30 DIAGNOSIS — R2681 Unsteadiness on feet: Secondary | ICD-10-CM

## 2022-04-30 DIAGNOSIS — R482 Apraxia: Secondary | ICD-10-CM

## 2022-04-30 DIAGNOSIS — R4701 Aphasia: Secondary | ICD-10-CM | POA: Diagnosis not present

## 2022-04-30 NOTE — Therapy (Signed)
OUTPATIENT SPEECH LANGUAGE PATHOLOGY TREATMENT (recert)    Patient Name: [name] MRN: [id number] DOB:1940/11/09, 81 y.o., female Today's Date: 04/30/2022  PCP: Hulan Fess, MD REFERRING PROVIDER: Courtney Heys, MD   End of Session - 04/30/22 1326     Visit Number 16    Number of Visits 25   +7 visits for recert   Date for SLP Re-Evaluation 06/09/22    Progress Note Due on Visit 20    SLP Start Time 1320    SLP Stop Time  1400    SLP Time Calculation (min) 40 min    Activity Tolerance Patient tolerated treatment well                        Past Medical History:  Diagnosis Date    Multiple pulmonary nodules, largest R mid lung 06/29/2013   Followed in Pulmonary clinic/ [facility]/ [place] - see CXR 06/27/13  - CT chest 07/14/2013 > 1. No acute findings in the thorax to account for the patient's  symptoms.  2. However, there is a subsolid nodule in the   right lower lobe that has a ground-glass attenuation component  measuring 2.5 x 1.8 cm, and a small central solid component  measuring 4 mm. Initial follow-up by chest CT without    Adenocarcinoma of lung, stage 1 ([place])    Status post right lower lobectomy August 2015   Anxiety    Asthma    Atrophic vaginitis    Chronic cough 12/20/2018   Chronic iritis of left eye    Pupil stays dilated; nonreactive   Chronic kidney disease, stage 3b (HCC)    Colon polyp    Contact lens/glasses fitting    wears contacts or glasses   Cough variant asthma with component of uacs  05/31/2013   Followed in Pulmonary clinic/ [facility]/ [place] in her 89's - Spirometry 02/23/13 wnl  - 07/03/2013 Sinus CT > Mild chronic sinus disease - No acute findings. Left-to-right nasal septal deviation of 3 mm. -med calendar 08/08/13 , redone 06/01/2016 and 02/22/2017  - Eos 4%   10/13/2013  > rec singulair daily  - FENO 05/12/2016  =   24 on singulair  - Allergy profile 05/12/2016 >   IgE  47 pos   Depression    Depression with  anxiety    Dysuria 08/03/2019   Environmental allergies    Essential hypertension 02/23/2017   Changed losartan to avapro 02/22/2017 due to cough    GERD (gastroesophageal reflux disease)    Hypertension    Hypertriglyceridemia    Kidney stones    OA (osteoarthritis)    Osteopenia    PONV (postoperative nausea and vomiting)    S/P lobectomy of lung 02/12/2014   Status post total knee replacement, left 08/26/2018   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   Past Surgical History:  Procedure Laterality Date   APPENDECTOMY     COLONOSCOPY     EYE SURGERY Left    cataract removal   ORIF WRIST FRACTURE Left 08/04/2013   Procedure: OPEN REDUCTION INTERNAL FIXATION (ORIF) LEFT DISTAL RADIUS WRIST FRACTURE;  Surgeon: Wynonia Sours, MD;  Location: [facility];  Service: Orthopedics;  Laterality: Left;   PARTIAL HYSTERECTOMY     TOTAL KNEE ARTHROPLASTY Left 08/26/2018   Procedure: TOTAL KNEE ARTHROPLASTY;  Surgeon: Netta Cedars, MD;  Location: [facility];  Service: Orthopedics;  Laterality: Left;   VIDEO ASSISTED THORACOSCOPY (VATS)/WEDGE RESECTION Right  02/12/2014   Procedure: RIGHT VIDEO ASSISTED THORACOSCOPY RIGHT LOWER LOBE LUNG /WEDGE RESECTION,RIGHT THORACOTOMY WITH RIGHT LOWER LOBE LOBECTOMY & NODE DISSECTION;  Surgeon: Melrose Nakayama, MD;  Location: [facility];  Service: Thoracic;  Laterality: Right;   VIDEO ASSISTED THORACOSCOPY (VATS)/WEDGE RESECTION Right 02/12/2014   VIDEO BRONCHOSCOPY N/A 02/12/2014   Procedure: VIDEO BRONCHOSCOPY;  Surgeon: Melrose Nakayama, MD;  Location: [facility];  Service: Thoracic;  Laterality: N/A;   Patient Active Problem List   Diagnosis Date Noted   Acute ischemic left MCA stroke ([place]) 01/28/2022   Aphasia as late effect of cerebrovascular accident (CVA) 01/28/2022   Right hemiparesis ([place]) 01/28/2022   Abnormal gait due to muscle weakness 01/28/2022   Insomnia 10/13/2021   Frequency of urination 10/13/2021   Vitamin D deficiency 10/13/2021    Chronic renal failure 10/13/2021   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[place]) 09/24/2021   Aphasia due to acute cerebrovascular accident (CVA) ([place]) 09/21/2021   Chronic kidney disease, stage 3b ([place]) 09/21/2021   Depression with anxiety 09/21/2021   Hypertension 09/21/2021   Asthma, chronic 09/21/2021   Fall at home, initial encounter 09/21/2021   Right ankle pain 09/21/2021   Dysuria 08/03/2019   Chronic cough 12/20/2018   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   Status post total knee replacement, left 08/26/2018   Essential hypertension 02/23/2017   Adenocarcinoma of lung, stage 1 ([place]) 05/08/2014   S/P lobectomy of lung 02/12/2014   Asthma 01/30/2014    Multiple pulmonary nodules, largest R mid lung 06/29/2013   Cough variant asthma with component of uacs  05/31/2013    ONSET DATE: March 2023, referral July 2023   REFERRING DIAG:  I69.320 (ICD-10-CM) - Aphasia as late effect of cerebrovascular accident (CVA)  I63.512 (ICD-10-CM) - Acute ischemic left MCA stroke (HCC)    THERAPY DIAG:  Apraxia  Cognitive communication deficit  Aphasia  Rationale for Evaluation and Treatment Rehabilitation  SUBJECTIVE:   SUBJECTIVE STATEMENT: Pt reports to having decided when HEP will fit into schedule, daughter to aid to daily planner  PAIN:  Are you having pain? No   OBJECTIVE:  TODAY'S TREATMENT:  04-30-22: SLP facilitated discussion on pt's personal goals for ST, with education on importance that therapy addresses those goals to increase motivation and positive outcomes. Pt expresses strong desire to work on her speech (apraxia and aphasia) as she finds her verbal expression difficulties very frustrating. Overall goal is increased independence and pt acknowledges need to work on increased independence in medication and schedule management. Additional needs ID are using phone or iPad as potential external cognitive aids or as device to A in written expression  as pt has persisting agraphia. SLP collaborated with pt to generate new therapy goals and provided additional education on importance of HEP adherence. SLP counseled on self-advocacy in goal setting and HEP establishment to ensure pt understands how/why activities have been selected to help her meet her goals. Pt verbalizes understanding.  04-28-22: Discussion on red, yellow, green activities for when alone, as metaphor for deciding safety of activities. Pt able to accurately categorize activities in 100% of trials and provide verbal reasoning for all selections. Discussion on poor HEP adherence. SLP educated on HEP should be tailored to help pt achieve her own goals. Recommend that pt ensure she understands the meaning behind all HEP. Generated strategies to aid in improved HEP completion, to include adding to daily calendar, creating routine, using visual aids, requesting verbal cues from caregivers at designated time  of day. Pt verbalizes understanding.   04-23-22: Pt missed appointments on Tuesday, reports did not know she had the appointments. Generated alternative schedule management strategies with A from caregivers. Pt verbalizes understanding. Daughter to f/u with caregivers for [place] of proposed strategies. Pt had not completed writing HEP, attempted to write items from grocery list with pt requiring max-A to write x2 items correctly. Discussion on potentially using technological tool voice to text. Would require training as not familiar with feature or it's use. Demonstrated accessibility modifications to phone that may aid in successful use of cell phone. Pt to return with cell phone and ipad to next session.   04-14-22: Target written expression through Copy and Recall Treatment (CART), remaining 12 stimuli for cornerstone phonemic to orthographic representations. Names 10/12 with rare min-A of phrase completion for remaining 2. Pt was able to write 6/12 words independently at baseline.  Occasional occurrence of saying and writing mismatched letters, requires verbal-A to double check work to ID errors. Pt was able to copy each word (x2 each) with 100% accuracy given initial instruction of copying and double checking work as she progressed. With visual cues removed, writes target words accurately 50% with occasional max-A to ID and attempt correction for errors.   04-10-22: Target written expression through Copy and Recall Treatment (CART). Initial set: Pt names 4/6 pictured targets without cueing. Min-A to achieve 100% accuracy. Pt was able to write 4/6 words independently at baseline. With occasional min-A, able to generate erroneous phonemes verbally but with some difficulty writing correct grapheme, perseveration of "t". Pt was able to copy written word (x2 each) with 100% accuracy. With written visual cues removed, the patient was able to recall and write 5/6 target words accurately on initial attempt. Corrects errors with min verbal cues. SLP introduced 2nd set of 6 targets, with pt naming 3/6 target words initially without cueing. Pt was able to write 1/6 words independently at baseline. Is able to verbally spell x2 words, requires max-A for accurately transcribing those letters to graphemes. Benefits from spelling orally while writing to achieve x2 accurate written spellings. Updated HEP.  04-07-22: SLP provided education for use of cognitive assistive devices, carefully selected for needs and accessiblity to aid in recall of required tasks. Pt reportedly missed 1 medication dose while caregiver was gone on Sunday. Pt reports does have Alexa. Generated strategy to use daily reminder via [place] for medication administration with back up of caregivers providing verbal-A. Provided coaching for level of cueing caregiver can provide to A in increased independence in medication administration. Generated strategy of asking for information in writing to A in recall of details of important  conversations. Pt to employ with PT later this date for recall of suggestions re: ambulation at home. Pt verbalizes understand of all targeted interventions, denies questions. SLP to assess understanding next session through teach back and report of employment of strategies.   03-24-22: Target strengthening orthographic representation of phonemic targets to enable improved ability to write. SLP initiated Copy and Recall Treatment (CART) with set of 20 words to facilitate key word usage for future phonological to orthographic correspondence in self-generated written words. Pt was able to name 6/6 words without cueing. Pt was able to write 20% of pictured words independently at baseline. The patient was able to copy a written word with 76% accuracy, independently. With occasional min-A  from SLP, this improved to 100% accuracy. Self-ID of errors in approximately 50% of trials. With written visual cues removed, the patient was  able to recall and write the target words with 85% accuracy on initial attempt. Corrects errors with min verbal cues. Update HEP.    PATIENT EDUCATION: Education details: see above Person educated: Patient Education method: Customer service manager Education comprehension: verbalized understanding, returned demonstration, and needs further education   GOALS: Goals reviewed with patient? Yes  SHORT TERM GOALS: Target date: 03/03/22  Pt will verbalize 7 items in personally relevant categories with occasional min-A over 2 therapy sessions Baseline: 02-24-22; 02/26/22 Goal status: MET  2.  Pt will correct semantic paraphasia given usual mod-A in 70% of opportunities during structured task over 2 sessions.  Baseline: 02-24-22; 02-26-22 Goal status: MET  3.  Pt will write x5 item list with 80% accuracy, given usual mod-A over 2 sessions.  Baseline: 02-24-22; 02-26-22 Goal status: NOT MET  4.  Pt will successfully use trained anomia compensation in x3 opportunities across 2  therapy sessions with occasioanl min-A.  Baseline: 02-24-22; 02-26-22 Goal status: PARTIALLY MET   LONG TERM GOALS: Target date: 06/09/2022 (+6 weeks for recert, new goals added)  Pt will identify, and correct, 50% of semantic paraphasias during structured task with occasional min-A over 2 sessions.  Baseline: 04-30-22 Goal status: MET  2.  Pt will report improvement of 2 points via CPIB PROM by d/c Baseline: 15 Goal status: IN PROGRESS  3.  Pt will carryover dysnomia compensations to conversational speech, resulting in strategy use in 70% of opportunities over 15 minute conversation Baseline: 04-28-22 Goal status: partially met  4.  Pt will write x10 phrases, with mod-I, with 80% accuracy over 2 sessions.   Baseline:  Goal status: not met  5.  Pt will carryover compensations for increased indpendence in home based tasks, resulting in successful completion of pt selected tasks over 1 week period Baseline: 04-28-22  Goal status: met  5.  Pt will teach back memory compensations to aid in recall of information discussed in conversations with rare min-A Baseline:   Goal status: new at recert  6.  Pt will use external cognitive aid to A in increased independence in schedule and medication management with mod-I  Goal status: new at recert  7.  Pt will use cell phone or iPad to generate grocery list of x10 items with supervision A  Goal status: new at recert   8.  Pt will complete moderately complex language task with 80% accuracy with rare min-A  Goal status: new at recert  9.  Pt will read 2 paragraph stimuli with employment of apraxia strategies, requiring no more than x3 A from SLP for correction  Goal status: new at recert  ASSESSMENT:  CLINICAL IMPRESSION: Patient is a 81 y.o. F presenting with mild-moderate expressive aphasia. Moderate improvement in discourse level speech, though intermittent vague language persists. Occasional anomia, with use of anomia strategies ~50% of  opportunities. Decreased instances of paraphasias with increased awareness and ability to correct. Written expression impaired, yet improving at word level. SLP has initiated skilled interventions and strategies to aid in abilites for desired functional tasks at home such as grocery list creation. SLP has initiated structured language tasks to aid in improved expressive language and enhanced communication confidence and efficacy. Additional therapy goals added per daughter concerns re: cognition impacting successful participation in home based activities. Initiated training of external cognitive assistive device as memory aid and caregiver education for appropriate cuing for routine changes to encourage independence in pt selected task of med administration.   OBJECTIVE IMPAIRMENTS include expressive language,  aphasia, and apraxia. These impairments are limiting patient from effectively communicating at home and in community. Factors affecting potential to achieve goals and functional outcome are family/community support. Patient will benefit from skilled SLP services to address above impairments and improve overall function.  REHAB POTENTIAL: Good  PLAN: SLP FREQUENCY: 2x/week  SLP DURATION: 8 weeks  PLANNED INTERVENTIONS: Language facilitation, Cueing hierachy, Internal/external aids, Functional tasks, Multimodal communication approach, SLP instruction and feedback, Compensatory strategies, and Patient/family education    Su Monks, CCC-SLP 04/30/2022, 2:21 PM

## 2022-04-30 NOTE — Therapy (Signed)
OUTPATIENT PHYSICAL THERAPY NEURO THERAPY   Patient Name: [name] MRN: [id number] DOB:1941-02-05, 81 y.o., female Today's Date: 04/30/2022   PCP: Pt unable to provide information and unable to locate in chart. REFERRING PROVIDER: Courtney Heys, MD    PT End of Session - 04/30/22 1405     Visit Number 18    Number of Visits 25    Date for PT Re-Evaluation 05/20/22    Authorization Type [employer] Medicare    Progress Note Due on Visit 20    PT Start Time 1403   received from ST   PT Stop Time 1444    PT Time Calculation (min) 41 min    Equipment Utilized During Treatment Gait belt    Activity Tolerance Patient tolerated treatment well    Behavior During Therapy WFL for tasks assessed/performed               Past Medical History:  Diagnosis Date    Multiple pulmonary nodules, largest R mid lung 06/29/2013   Followed in Pulmonary clinic/ [facility]/ [facility] - see CXR 06/27/13  - CT chest 07/14/2013 > 1. No acute findings in the thorax to account for the patient's  symptoms.  2. However, there is a subsolid nodule in the   right lower lobe that has a ground-glass attenuation component  measuring 2.5 x 1.8 cm, and a small central solid component  measuring 4 mm. Initial follow-up by chest CT without    Adenocarcinoma of lung, stage 1 ([place])    Status post right lower lobectomy August 2015   Anxiety    Asthma    Atrophic vaginitis    Chronic cough 12/20/2018   Chronic iritis of left eye    Pupil stays dilated; nonreactive   Chronic kidney disease, stage 3b (HCC)    Colon polyp    Contact lens/glasses fitting    wears contacts or glasses   Cough variant asthma with component of uacs  05/31/2013   Followed in Pulmonary clinic/ [facility]/ [facility] in her 74's - Spirometry 02/23/13 wnl  - 07/03/2013 Sinus CT > Mild chronic sinus disease - No acute findings. Left-to-right nasal septal deviation of 3 mm. -med calendar 08/08/13 , redone 06/01/2016 and 02/22/2017  -  Eos 4%   10/13/2013  > rec singulair daily  - FENO 05/12/2016  =   24 on singulair  - Allergy profile 05/12/2016 >   IgE  47 pos   Depression    Depression with anxiety    Dysuria 08/03/2019   Environmental allergies    Essential hypertension 02/23/2017   Changed losartan to avapro 02/22/2017 due to cough    GERD (gastroesophageal reflux disease)    Hypertension    Hypertriglyceridemia    Kidney stones    OA (osteoarthritis)    Osteopenia    PONV (postoperative nausea and vomiting)    S/P lobectomy of lung 02/12/2014   Status post total knee replacement, left 08/26/2018   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   Past Surgical History:  Procedure Laterality Date   APPENDECTOMY     COLONOSCOPY     EYE SURGERY Left    cataract removal   ORIF WRIST FRACTURE Left 08/04/2013   Procedure: OPEN REDUCTION INTERNAL FIXATION (ORIF) LEFT DISTAL RADIUS WRIST FRACTURE;  Surgeon: Emily Sours, MD;  Location: [place];  Service: Orthopedics;  Laterality: Left;   PARTIAL HYSTERECTOMY     TOTAL KNEE ARTHROPLASTY Left 08/26/2018   Procedure: TOTAL KNEE  ARTHROPLASTY;  Surgeon: Emily Cedars, MD;  Location: [facility];  Service: Orthopedics;  Laterality: Left;   VIDEO ASSISTED THORACOSCOPY (VATS)/WEDGE RESECTION Right 02/12/2014   Procedure: RIGHT VIDEO ASSISTED THORACOSCOPY RIGHT LOWER LOBE LUNG /WEDGE RESECTION,RIGHT THORACOTOMY WITH RIGHT LOWER LOBE LOBECTOMY & NODE DISSECTION;  Surgeon: Emily Nakayama, MD;  Location: [place];  Service: Thoracic;  Laterality: Right;   VIDEO ASSISTED THORACOSCOPY (VATS)/WEDGE RESECTION Right 02/12/2014   VIDEO BRONCHOSCOPY N/A 02/12/2014   Procedure: VIDEO BRONCHOSCOPY;  Surgeon: Emily Nakayama, MD;  Location: [place];  Service: Thoracic;  Laterality: N/A;   Patient Active Problem List   Diagnosis Date Noted   Acute ischemic left MCA stroke ([place]) 01/28/2022   Aphasia as late effect of cerebrovascular accident (CVA) 01/28/2022   Right hemiparesis ([place])  01/28/2022   Abnormal gait due to muscle weakness 01/28/2022   Insomnia 10/13/2021   Frequency of urination 10/13/2021   Vitamin D deficiency 10/13/2021   Chronic renal failure 10/13/2021   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[place]) 09/24/2021   Aphasia due to acute cerebrovascular accident (CVA) ([place]) 09/21/2021   Chronic kidney disease, stage 3b ([place]) 09/21/2021   Depression with anxiety 09/21/2021   Hypertension 09/21/2021   Asthma, chronic 09/21/2021   Fall at home, initial encounter 09/21/2021   Right ankle pain 09/21/2021   Dysuria 08/03/2019   Chronic cough 12/20/2018   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   Status post total knee replacement, left 08/26/2018   Essential hypertension 02/23/2017   Adenocarcinoma of lung, stage 1 ([place]) 05/08/2014   S/P lobectomy of lung 02/12/2014   Asthma 01/30/2014    Multiple pulmonary nodules, largest R mid lung 06/29/2013   Cough variant asthma with component of uacs  05/31/2013    ONSET DATE: 01/28/2022   REFERRING DIAG: G81.91 (ICD-10-CM) - Right hemiparesis (HCC) I63.512 (ICD-10-CM) - Acute ischemic left MCA stroke (HCC)   THERAPY DIAG:  History of falling  Muscle weakness (generalized)  Other abnormalities of gait and mobility  Unsteadiness on feet  Rationale for Evaluation and Treatment Rehabilitation  SUBJECTIVE:                                                                                                                                                                                             SUBJECTIVE STATEMENT: Patient reports doing well. Dtr present today. Reports frustration with being told to use her rollator "at all times."   Pt accompanied by: self   PERTINENT HISTORY: hx of hypertension, thrombotic stroke involving left middle cerebral artery, asthma, lung cancer, coronary calcification seen on CT and CKD   PAIN:  Are you having pain? No  PRECAUTIONS: Fall  PATIENT GOALS "to  be able to walk without help and without pain"  OBJECTIVE:   TODAY'S TREATMENT: Ther Act  -extensive conversation on getting into routine of applying life alert in the AM and wearing throughout the day.  - maintaining patients independence, but ensuring that she's safe    GAIT:  -230' no AD   -forced reciprocal arm swing with hiking poles (minimal carryover once PT removed assist)    -very limited ability to complete simple dual motor task  PATIENT EDUCATION: Education details: continue HEP, PT POC, use of life alert, use of rollator Person educated: Patient and Child(ren) Education method: Explanation and Demonstration Education comprehension: verbalized understanding and needs further education   HOME EXERCISE PROGRAM: Access Code: [email or web address] URL: [email or web address] Date: 02/10/2022 Prepared by: Excell Seltzer  Exercises - Side Stepping with Resistance at Ankles and Counter Support  - 1 x daily - 7 x weekly - 3 sets - 10 reps - Forward Step Up with Counter Support  - 1 x daily - 7 x weekly - 3 sets - 10 reps - Towel Scrunches  - 1 x daily - 7 x weekly - 2 sets - 10 reps - Seated Ankle Alphabet  - 1 x daily - 7 x weekly - 1 sets - 10 reps - Mini Squat with Counter Support  - 1 x daily - 7 x weekly - 3 sets - 10 reps   GOALS: Goals reviewed with patient? Yes   NEW LONG TERM GOALS: Target date: 05/20/2022  Pt will be independent with balance and strengthening HEP for improved strength, balance, transfers and gait. Baseline:  Goal status: IN PROGRESS  2.  Pt will improve gait velocity to at least 3.0 ft/sec for improved gait efficiency and performance at mod I level with LRAD Baseline: 2.26 ft/sec S* with RW (8/1), 2.35 ft/sec with RW (8/24), 2.1 ft/sec with no AD (10/3) Goal status: IN PROGRESS  3.  Pt will improve normal TUG to less than or equal to 12 seconds for improved functional mobility and decreased fall risk. Baseline: 14 sec with RW (8/1),  11.91 sec with no AD (8/24), 12.6 sec with no AD (10/3) Goal status: IN PROGRESS  4.  Pt will improve Berg score to 46/56 for decreased fall risk Baseline: 34/56 (8/8), 40/56 (10/3) Goal status: IN PROGRESS  5.  Pt will improve 5 x STS to less than or equal to 13 seconds to demonstrate improved functional strength and transfer efficiency.  Baseline: 13.8 sec (8/1), 15.28 sec (8/24), 15.28 sec (10/3) Goal status: REVISED   ASSESSMENT:  CLINICAL IMPRESSION: Patient seen for skilled PT session with emphasis on gait training and patient education. Patient remains with difficulty completing simple dual motor task (reciprocal arm swing) and requires ~MaxA to sequence this with minimal carryover noted once PT removed assist. Extensive conversation on including donning life alert to part of her morning routine and plugging it in to charge at night. Patient and dtr appreciative of education. Continue POC.    OBJECTIVE IMPAIRMENTS Abnormal gait, decreased balance, decreased coordination, decreased mobility, decreased strength, decreased safety awareness, and pain.   ACTIVITY LIMITATIONS carrying, lifting, bending, squatting, stairs, transfers, bathing, dressing, and hygiene/grooming  PARTICIPATION LIMITATIONS: meal prep, cleaning, laundry, medication management, driving, shopping, community activity, yard work, and school  PERSONAL FACTORS hx of hypertension, thrombotic stroke involving left middle cerebral artery, asthma, lung cancer, coronary calcification seen on CT and CKD  are also affecting  patient's functional outcome.   REHAB POTENTIAL: Good  CLINICAL DECISION MAKING: Stable/uncomplicated  EVALUATION COMPLEXITY: Moderate  PLAN: PT FREQUENCY: 2x/week  PT DURATION: 8 weeks+ 8 visits (recert)  PLANNED INTERVENTIONS: Therapeutic exercises, Therapeutic activity, Neuromuscular re-education, Balance training, Gait training, Patient/Family education, Self Care, Joint mobilization, Stair  training, DME instructions, Electrical stimulation, Cryotherapy, Moist heat, Taping, Manual therapy, and Re-evaluation  PLAN FOR NEXT SESSION:  Did pt do HEP? Is she using rollator? add to HEP for balance and strengthening, obstacle course, LE coordination, tandem gait, backwards gait, keep working on gait with no AD (carrying stuff with no UE support, uneven surface), turns, treadmill with focus on increasing step length (can try ball kicks again)   Debbora Dus, PT, DPT Debbora Dus, PT, DPT, CBIS  04/30/2022, 2:44 PM

## 2022-05-05 ENCOUNTER — Ambulatory Visit: Payer: Medicare Other | Admitting: Physical Therapy

## 2022-05-05 ENCOUNTER — Ambulatory Visit: Payer: Medicare Other | Admitting: Occupational Therapy

## 2022-05-05 DIAGNOSIS — M6281 Muscle weakness (generalized): Secondary | ICD-10-CM

## 2022-05-05 DIAGNOSIS — R278 Other lack of coordination: Secondary | ICD-10-CM | POA: Diagnosis not present

## 2022-05-05 DIAGNOSIS — Z9181 History of falling: Secondary | ICD-10-CM | POA: Diagnosis not present

## 2022-05-05 DIAGNOSIS — I69190 Apraxia following nontraumatic intracerebral hemorrhage: Secondary | ICD-10-CM

## 2022-05-05 DIAGNOSIS — R2689 Other abnormalities of gait and mobility: Secondary | ICD-10-CM

## 2022-05-05 DIAGNOSIS — I69351 Hemiplegia and hemiparesis following cerebral infarction affecting right dominant side: Secondary | ICD-10-CM | POA: Diagnosis not present

## 2022-05-05 DIAGNOSIS — R2681 Unsteadiness on feet: Secondary | ICD-10-CM

## 2022-05-05 DIAGNOSIS — R482 Apraxia: Secondary | ICD-10-CM | POA: Diagnosis not present

## 2022-05-05 DIAGNOSIS — R4701 Aphasia: Secondary | ICD-10-CM | POA: Diagnosis not present

## 2022-05-05 NOTE — Therapy (Signed)
OUTPATIENT PHYSICAL THERAPY NEURO THERAPY   Patient Name: [name] MRN: [id number] DOB:07/16/1940, 81 y.o., female Today's Date: 05/05/2022   PCP: Pt unable to provide information and unable to locate in chart. REFERRING PROVIDER: Courtney Heys, MD    PT End of Session - 05/05/22 1336     Visit Number 19    Number of Visits 25    Date for PT Re-Evaluation 05/20/22    Authorization Type UHC Medicare    Progress Note Due on Visit 20    PT Start Time 1335   pt late   PT Stop Time 1400    PT Time Calculation (min) 25 min    Equipment Utilized During Treatment Gait belt    Activity Tolerance Patient tolerated treatment well    Behavior During Therapy WFL for tasks assessed/performed               Past Medical History:  Diagnosis Date    Multiple pulmonary nodules, largest R mid lung 06/29/2013   Followed in Pulmonary clinic/ [facility]/ [place] - see CXR 06/27/13  - CT chest 07/14/2013 > 1. No acute findings in the thorax to account for the patient's  symptoms.  2. However, there is a subsolid nodule in the   right lower lobe that has a ground-glass attenuation component  measuring 2.5 x 1.8 cm, and a small central solid component  measuring 4 mm. Initial follow-up by chest CT without    Adenocarcinoma of lung, stage 1 ([place])    Status post right lower lobectomy August 2015   Anxiety    Asthma    Atrophic vaginitis    Chronic cough 12/20/2018   Chronic iritis of left eye    Pupil stays dilated; nonreactive   Chronic kidney disease, stage 3b (HCC)    Colon polyp    Contact lens/glasses fitting    wears contacts or glasses   Cough variant asthma with component of uacs  05/31/2013   Followed in Pulmonary clinic/ [facility]/ [facility] in her 54's - Spirometry 02/23/13 wnl  - 07/03/2013 Sinus CT > Mild chronic sinus disease - No acute findings. Left-to-right nasal septal deviation of 3 mm. -med calendar 08/08/13 , redone 06/01/2016 and 02/22/2017  - Eos 4%    10/13/2013  > rec singulair daily  - FENO 05/12/2016  =   24 on singulair  - Allergy profile 05/12/2016 >   IgE  47 pos   Depression    Depression with anxiety    Dysuria 08/03/2019   Environmental allergies    Essential hypertension 02/23/2017   Changed losartan to avapro 02/22/2017 due to cough    GERD (gastroesophageal reflux disease)    Hypertension    Hypertriglyceridemia    Kidney stones    OA (osteoarthritis)    Osteopenia    PONV (postoperative nausea and vomiting)    S/P lobectomy of lung 02/12/2014   Status post total knee replacement, left 08/26/2018   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   Past Surgical History:  Procedure Laterality Date   APPENDECTOMY     COLONOSCOPY     EYE SURGERY Left    cataract removal   ORIF WRIST FRACTURE Left 08/04/2013   Procedure: OPEN REDUCTION INTERNAL FIXATION (ORIF) LEFT DISTAL RADIUS WRIST FRACTURE;  Surgeon: Wynonia Sours, MD;  Location: [place];  Service: Orthopedics;  Laterality: Left;   PARTIAL HYSTERECTOMY     TOTAL KNEE ARTHROPLASTY Left 08/26/2018   Procedure: TOTAL KNEE ARTHROPLASTY;  Surgeon: Netta Cedars, MD;  [facility];  Service: Orthopedics;  Laterality: Left;   VIDEO ASSISTED THORACOSCOPY (VATS)/WEDGE RESECTION Right 02/12/2014   Procedure: RIGHT VIDEO ASSISTED THORACOSCOPY RIGHT LOWER LOBE LUNG /WEDGE RESECTION,RIGHT THORACOTOMY WITH RIGHT LOWER LOBE LOBECTOMY & NODE DISSECTION;  Surgeon: Melrose Nakayama, MD;  Location: [place];  Service: Thoracic;  Laterality: Right;   VIDEO ASSISTED THORACOSCOPY (VATS)/WEDGE RESECTION Right 02/12/2014   VIDEO BRONCHOSCOPY N/A 02/12/2014   Procedure: VIDEO BRONCHOSCOPY;  Surgeon: Melrose Nakayama, MD;  Location: [place];  Service: Thoracic;  Laterality: N/A;   Patient Active Problem List   Diagnosis Date Noted   Acute ischemic left MCA stroke ([place]) 01/28/2022   Aphasia as late effect of cerebrovascular accident (CVA) 01/28/2022   Right hemiparesis ([place]) 01/28/2022    Abnormal gait due to muscle weakness 01/28/2022   Insomnia 10/13/2021   Frequency of urination 10/13/2021   Vitamin D deficiency 10/13/2021   Chronic renal failure 10/13/2021   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[place]) 09/24/2021   Aphasia due to acute cerebrovascular accident (CVA) ([place]) 09/21/2021   Chronic kidney disease, stage 3b ([place]) 09/21/2021   Depression with anxiety 09/21/2021   Hypertension 09/21/2021   Asthma, chronic 09/21/2021   Fall at home, initial encounter 09/21/2021   Right ankle pain 09/21/2021   Dysuria 08/03/2019   Chronic cough 12/20/2018   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   Status post total knee replacement, left 08/26/2018   Essential hypertension 02/23/2017   Adenocarcinoma of lung, stage 1 ([place]) 05/08/2014   S/P lobectomy of lung 02/12/2014   Asthma 01/30/2014    Multiple pulmonary nodules, largest R mid lung 06/29/2013   Cough variant asthma with component of uacs  05/31/2013    ONSET DATE: 01/28/2022   REFERRING DIAG: G81.91 (ICD-10-CM) - Right hemiparesis (HCC) I63.512 (ICD-10-CM) - Acute ischemic left MCA stroke (HCC)   THERAPY DIAG:  History of falling  Muscle weakness (generalized)  Other abnormalities of gait and mobility  Unsteadiness on feet  Rationale for Evaluation and Treatment Rehabilitation  SUBJECTIVE:                                                                                                                                                                                             SUBJECTIVE STATEMENT: Pt reports she has been wearing her life alert at home, not wearing currently to her appointment. Pt reports she been taking walks outdoors with her adult children or her caregivers and has been using her rollator. Pt reports intermittent use of her rollator indoors despite therapy recommendations that she use it at all times.  Pt accompanied by: self   PERTINENT HISTORY: hx of hypertension,  thrombotic stroke involving left middle cerebral artery, asthma, lung cancer, coronary calcification seen on CT and CKD   PAIN:  Are you having pain? No  PRECAUTIONS: Fall  PATIENT GOALS "to be able to walk without help and without pain"  OBJECTIVE:   TODAY'S TREATMENT: Ther Act  Discussed PT POC and remaining visits with focus on honing pt's current HEP and focusing on any remaining deficits that she wants to address.  THER EX: Adjusted HEP, removed ankle exercises as pt reports improved mobility and decreased pain in R ankle joint  Reviewed remaining 3 exercises: -resisted side-steps L/R with RTB with cues for keeping RLE from ER -mini-squats at countertop with difficulty performing exercise with correct body mechanics -not able to review step-ups due to time constraints   PATIENT EDUCATION: Education details: continue use of life alert and rollator at home, PT POC Person educated: Patient and Child(ren) Education method: Explanation and Demonstration Education comprehension: verbalized understanding and needs further education   HOME EXERCISE PROGRAM: Access Code: [email or web address] URL: [email or web address] Date: 02/10/2022 Prepared by: Excell Seltzer  Exercises - Side Stepping with Resistance at Ankles and Counter Support  - 1 x daily - 7 x weekly - 3 sets - 10 reps - Forward Step Up with Counter Support  - 1 x daily - 7 x weekly - 3 sets - 10 reps - Mini Squat with Counter Support  - 1 x daily - 7 x weekly - 3 sets - 10 reps   GOALS: Goals reviewed with patient? Yes   NEW LONG TERM GOALS: Target date: 05/20/2022  Pt will be independent with balance and strengthening HEP for improved strength, balance, transfers and gait. Baseline:  Goal status: IN PROGRESS  2.  Pt will improve gait velocity to at least 3.0 ft/sec for improved gait efficiency and performance at mod I level with LRAD Baseline: 2.26 ft/sec S* with RW (8/1), 2.35 ft/sec with RW (8/24), 2.1  ft/sec with no AD (10/3) Goal status: IN PROGRESS  3.  Pt will improve normal TUG to less than or equal to 12 seconds for improved functional mobility and decreased fall risk. Baseline: 14 sec with RW (8/1), 11.91 sec with no AD (8/24), 12.6 sec with no AD (10/3) Goal status: IN PROGRESS  4.  Pt will improve Berg score to 46/56 for decreased fall risk Baseline: 34/56 (8/8), 40/56 (10/3) Goal status: IN PROGRESS  5.  Pt will improve 5 x STS to less than or equal to 13 seconds to demonstrate improved functional strength and transfer efficiency.  Baseline: 13.8 sec (8/1), 15.28 sec (8/24), 15.28 sec (10/3) Goal status: REVISED   ASSESSMENT:  CLINICAL IMPRESSION: Emphasis of skilled PT session on reviewing pt's current HEP to make sure it is still challenging for her and trying to ensure exercises are being performed safely as well as making sure they are targeting pt's goals to improve compliance. Session limited due to pt's late arrival. Will further review and adjust HEP next session. Continue POC.   OBJECTIVE IMPAIRMENTS Abnormal gait, decreased balance, decreased coordination, decreased mobility, decreased strength, decreased safety awareness, and pain.   ACTIVITY LIMITATIONS carrying, lifting, bending, squatting, stairs, transfers, bathing, dressing, and hygiene/grooming  PARTICIPATION LIMITATIONS: meal prep, cleaning, laundry, medication management, driving, shopping, community activity, yard work, and school  PERSONAL FACTORS hx of hypertension, thrombotic stroke involving left middle cerebral artery, asthma, lung cancer, coronary calcification seen on CT and CKD  are  also affecting patient's functional outcome.   REHAB POTENTIAL: Good  CLINICAL DECISION MAKING: Stable/uncomplicated  EVALUATION COMPLEXITY: Moderate  PLAN: PT FREQUENCY: 2x/week  PT DURATION: 8 weeks+ 8 visits (recert)  PLANNED INTERVENTIONS: Therapeutic exercises, Therapeutic activity, Neuromuscular  re-education, Balance training, Gait training, Patient/Family education, Self Care, Joint mobilization, Stair training, DME instructions, Electrical stimulation, Cryotherapy, Moist heat, Taping, Manual therapy, and Re-evaluation  PLAN FOR NEXT SESSION:  Did pt do HEP? Is she using rollator? add to HEP for balance and strengthening, obstacle course, LE coordination, tandem gait, backwards gait, keep working on gait with no AD (carrying stuff with no UE support, uneven surface), turns, treadmill with focus on increasing step length (can try ball kicks again), practice squats and adjust HEP again   Excell Seltzer, PT, DPT, CSRS  05/05/2022, 2:02 PM

## 2022-05-05 NOTE — Therapy (Signed)
OUTPATIENT OCCUPATIONAL THERAPY NEURO TREATMENT  Patient Name: [name] MRN: [id number] DOB:October 08, 1940, 81 y.o., female Today's Date: 05/05/2022  PCP: NA REFERRING PROVIDER: Courtney Heys, MD    OT End of Session - 05/05/22 1514     Visit Number 8    Number of Visits 10   6+4=10   Date for OT Re-Evaluation 06/13/22    Authorization Type [employer] Medicare    Authorization - Number of Visits 8    Progress Note Due on Visit 10    OT Start Time 1403    OT Stop Time 1450    OT Time Calculation (min) 47 min    Activity Tolerance Patient tolerated treatment well    Behavior During Therapy [facility] for tasks assessed/performed                 Past Medical History:  Diagnosis Date    Multiple pulmonary nodules, largest R mid lung 06/29/2013   Followed in Pulmonary clinic/ [facility]/ [facility] - see CXR 06/27/13  - CT chest 07/14/2013 > 1. No acute findings in the thorax to account for the patient's  symptoms.  2. However, there is a subsolid nodule in the   right lower lobe that has a ground-glass attenuation component  measuring 2.5 x 1.8 cm, and a small central solid component  measuring 4 mm. Initial follow-up by chest CT without    Adenocarcinoma of lung, stage 1 ([place])    Status post right lower lobectomy August 2015   Anxiety    Asthma    Atrophic vaginitis    Chronic cough 12/20/2018   Chronic iritis of left eye    Pupil stays dilated; nonreactive   Chronic kidney disease, stage 3b (HCC)    Colon polyp    Contact lens/glasses fitting    wears contacts or glasses   Cough variant asthma with component of uacs  05/31/2013   Followed in Pulmonary clinic/ [facility]/ [facility] in her 4's - Spirometry 02/23/13 wnl  - 07/03/2013 Sinus CT > Mild chronic sinus disease - No acute findings. Left-to-right nasal septal deviation of 3 mm. -med calendar 08/08/13 , redone 06/01/2016 and 02/22/2017  - Eos 4%   10/13/2013  > rec singulair daily  - FENO 05/12/2016  =   24 on  singulair  - Allergy profile 05/12/2016 >   IgE  47 pos   Depression    Depression with anxiety    Dysuria 08/03/2019   Environmental allergies    Essential hypertension 02/23/2017   Changed losartan to avapro 02/22/2017 due to cough    GERD (gastroesophageal reflux disease)    Hypertension    Hypertriglyceridemia    Kidney stones    OA (osteoarthritis)    Osteopenia    PONV (postoperative nausea and vomiting)    S/P lobectomy of lung 02/12/2014   Status post total knee replacement, left 08/26/2018   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   Past Surgical History:  Procedure Laterality Date   APPENDECTOMY     COLONOSCOPY     EYE SURGERY Left    cataract removal   ORIF WRIST FRACTURE Left 08/04/2013   Procedure: OPEN REDUCTION INTERNAL FIXATION (ORIF) LEFT DISTAL RADIUS WRIST FRACTURE;  Surgeon: Wynonia Sours, MD;  Location: [place];  Service: Orthopedics;  Laterality: Left;   PARTIAL HYSTERECTOMY     TOTAL KNEE ARTHROPLASTY Left 08/26/2018   Procedure: TOTAL KNEE ARTHROPLASTY;  Surgeon: Netta Cedars, MD;  [facility];  Service:  Orthopedics;  Laterality: Left;   VIDEO ASSISTED THORACOSCOPY (VATS)/WEDGE RESECTION Right 02/12/2014   Procedure: RIGHT VIDEO ASSISTED THORACOSCOPY RIGHT LOWER LOBE LUNG /WEDGE RESECTION,RIGHT THORACOTOMY WITH RIGHT LOWER LOBE LOBECTOMY & NODE DISSECTION;  Surgeon: Melrose Nakayama, MD;  Location: [place];  Service: Thoracic;  Laterality: Right;   VIDEO ASSISTED THORACOSCOPY (VATS)/WEDGE RESECTION Right 02/12/2014   VIDEO BRONCHOSCOPY N/A 02/12/2014   Procedure: VIDEO BRONCHOSCOPY;  Surgeon: Melrose Nakayama, MD;  Location: [place];  Service: Thoracic;  Laterality: N/A;   Patient Active Problem List   Diagnosis Date Noted   Acute ischemic left MCA stroke ([place]) 01/28/2022   Aphasia as late effect of cerebrovascular accident (CVA) 01/28/2022   Right hemiparesis ([place]) 01/28/2022   Abnormal gait due to muscle weakness 01/28/2022    Insomnia 10/13/2021   Frequency of urination 10/13/2021   Vitamin D deficiency 10/13/2021   Chronic renal failure 10/13/2021   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[place]) 09/24/2021   Aphasia due to acute cerebrovascular accident (CVA) ([place]) 09/21/2021   Chronic kidney disease, stage 3b ([place]) 09/21/2021   Depression with anxiety 09/21/2021   Hypertension 09/21/2021   Asthma, chronic 09/21/2021   Fall at home, initial encounter 09/21/2021   Right ankle pain 09/21/2021   Dysuria 08/03/2019   Chronic cough 12/20/2018   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   Status post total knee replacement, left 08/26/2018   Essential hypertension 02/23/2017   Adenocarcinoma of lung, stage 1 ([place]) 05/08/2014   S/P lobectomy of lung 02/12/2014   Asthma 01/30/2014    Multiple pulmonary nodules, largest R mid lung 06/29/2013   Cough variant asthma with component of uacs  05/31/2013    ONSET DATE: 01/28/2022   REFERRING DIAG: G81.91 (ICD-10-CM) - Right hemiparesis (HCC) I63.512 (ICD-10-CM) - Acute ischemic left MCA stroke (HCC)   THERAPY DIAG:  Muscle weakness (generalized)  Other abnormalities of gait and mobility  Unsteadiness on feet  Other lack of coordination  Hemiplegia and hemiparesis following cerebral infarction affecting right dominant side (HCC)  Apraxia following nontraumatic intracerebral hemorrhage  Rationale for Evaluation and Treatment Rehabilitation  SUBJECTIVE:   SUBJECTIVE STATEMENT: Daughter  is coming week of 05/18/22.   Pt accompanied by: self  PERTINENT HISTORY:  hx of hypertension, thrombotic stroke involving left middle cerebral artery, asthma, lung cancer, coronary calcification seen on CT and CKD    PRECAUTIONS: Fall  WEIGHT BEARING RESTRICTIONS No  PAIN:  Are you having pain? NO  FALLS: Has patient fallen in last 6 months? Yes [place] last week.  Fell backward, could not get up without help  LIVING ENVIRONMENT: Lives with: lives  alone with aide/caregiver Mon-Fri Lives in: House/apartment (one level home w/ 1 step to enter)   Has following equipment at home: Environmental consultant - 4 wheeled, Electronics engineer, and Grab bars  PLOF: Independent with basic ADLs  PATIENT GOALS I want to be more independent  OBJECTIVE:   Today's treatment:   Dynamic activities in standing without UE support, dynamic functional reaching with trunk rotation to place and remove graded clothespins from vertical antennae, and placing and removing large to small pegs in pegboard, close supervision, min v.c.   Standing in kitchen pt retrieved a mug, filled with water and transported to microwave using rollator to carry , min-mod v.c for safety and rollator use, increased time required.  Discussion with pt that she should increase her I with all activities IADLS/ADLS prior to considering return to driving. Pt's dtr will be in town 05/18/22.  Currently pt has hired caregivers all day up until bed time.     PATIENT EDUCATION: Education details: Recommendation that pt does not consider driving until she is able to complete ADLs/IADLS mod I without caregiver's. Pt was previously provided information for driving evaluations. Walker safety for [employer] Person educated: Patient Education method: Consulting civil engineer, Media planner, Verbal cues, Education comprehension: verbalized understanding , needs reinforcement   HOME EXERCISE PROGRAM: Driving Evaluation Info Issued 03/16/22:  UE yellow theraband HEP; Functional Home Activity Program to incr IADL participation (with supervision)--see pt instructions    GOALS: Goals reviewed with patient? Yes  SHORT TERM GOALS: Target date: 03/08/22  Patient will complete a home activity program designed to decrease level of care provided by paid caregivers Baseline: Goal status: Met. Per pt report 04/14/22  2.  Patient will transfer into / out of shower with distant supervision Baseline:  Goal status: Partially met.   04/14/22:  min A at home as grab bars are not installed yet, but pt performed at beach with distant supervision (zero entry)  3.  Patient will wash her back with AE without physical assistance Baseline:  Goal status: Partially met.  04/14/22:  has long handled sponge, but aide performs (pt performed without assist at the beach).     LONG TERM GOALS: Target date: 06/13/22  Patient will reduce at least 6 hours total of paid caregiver time Goal status: IN PROGRESS.  04/14/22:  has not reduced paid caregiver time  2.  Patient will demonstrate awareness of return to driving recommendations Goal status: Met.  Has been issued (reviewed 04/14/22)  3.  Patient will dress herself with modified independence including right ankle brace Goal status: MET  04/14/22 Met per pt except brace (pt hasn't be wearing).  4.  Patient will demonstrate improved stand balance as evidenced by standing without UE support during functional activities.   Goal status: IN PROGRESS, not consistent 04/14/22   5.  New for renewal:  Pt will be independent with updated functional home activity program to decr level of care provided by hired caregivers.     Goal status:  New         ASSESSMENT:  CLINICAL IMPRESSION: Pt progressing towards goals. She has not been seen consistently due to scheduling. Additional visits added as a result. Pt limited by cognitive deficits, decr balance, and decr endurance.    PERFORMANCE DEFICITS in functional skills including coordination, dexterity, proprioception, sensation, tone, ROM, strength, flexibility, FMC, GMC, and UE functional use,   IMPAIRMENTS are limiting patient from ADLs and IADLs.   COMORBIDITIES may have co-morbidities  that affects occupational performance. Patient will benefit from skilled OT to address above impairments and improve overall function.  MODIFICATION OR ASSISTANCE TO COMPLETE EVALUATION: Min-Moderate modification of tasks or assist with assess  necessary to complete an evaluation.  OT OCCUPATIONAL PROFILE AND HISTORY: Detailed assessment: Review of records and additional review of physical, cognitive, psychosocial history related to current functional performance.  CLINICAL DECISION MAKING: Moderate - several treatment options, min-mod task modification necessary  REHAB POTENTIAL: Good  EVALUATION COMPLEXITY: Moderate   PLAN: OT FREQUENCY: 1x/week  OT DURATION:  4 sessions over 8 weeks due to scheduling needs   PLANNED INTERVENTIONS: self care/ADL training, therapeutic exercise, therapeutic activity, neuromuscular re-education, balance training, functional mobility training, aquatic therapy, patient/family education, visual/perceptual remediation/compensation, and DME and/or AE instructions  RECOMMENDED OTHER SERVICES: NA  CONSULTED AND AGREED WITH PLAN OF CARE: Patient  PLAN FOR NEXT SESSION:  simulate ADLS/ IADLS  and work with pt on how she could safely reduce caregiver's, Pt's dtr will be in town week of 05/18/22. practice IADLs, continue standing balance without UE support  Federick Levene, OTR/L 05/05/2022, 3:28 PM

## 2022-05-07 ENCOUNTER — Ambulatory Visit: Payer: Medicare Other | Attending: Physical Medicine and Rehabilitation | Admitting: Physical Therapy

## 2022-05-07 DIAGNOSIS — I69190 Apraxia following nontraumatic intracerebral hemorrhage: Secondary | ICD-10-CM | POA: Insufficient documentation

## 2022-05-07 DIAGNOSIS — R4701 Aphasia: Secondary | ICD-10-CM | POA: Diagnosis not present

## 2022-05-07 DIAGNOSIS — R2689 Other abnormalities of gait and mobility: Secondary | ICD-10-CM | POA: Diagnosis not present

## 2022-05-07 DIAGNOSIS — M6281 Muscle weakness (generalized): Secondary | ICD-10-CM | POA: Insufficient documentation

## 2022-05-07 DIAGNOSIS — I63312 Cerebral infarction due to thrombosis of left middle cerebral artery: Secondary | ICD-10-CM | POA: Insufficient documentation

## 2022-05-07 DIAGNOSIS — R482 Apraxia: Secondary | ICD-10-CM | POA: Insufficient documentation

## 2022-05-07 DIAGNOSIS — R2681 Unsteadiness on feet: Secondary | ICD-10-CM | POA: Diagnosis not present

## 2022-05-07 DIAGNOSIS — R278 Other lack of coordination: Secondary | ICD-10-CM | POA: Insufficient documentation

## 2022-05-07 DIAGNOSIS — Z9181 History of falling: Secondary | ICD-10-CM | POA: Diagnosis not present

## 2022-05-07 DIAGNOSIS — I69351 Hemiplegia and hemiparesis following cerebral infarction affecting right dominant side: Secondary | ICD-10-CM | POA: Diagnosis not present

## 2022-05-07 DIAGNOSIS — R41841 Cognitive communication deficit: Secondary | ICD-10-CM | POA: Diagnosis present

## 2022-05-07 NOTE — Therapy (Signed)
OUTPATIENT PHYSICAL THERAPY NEURO [place] PROGRESS NOTE   Patient Name: [name] MRN: [id number] DOB:[birth year], 81 y.o., female Today's Date: 05/07/2022   PCP: Pt unable to provide information and unable to locate in chart. REFERRING PROVIDER: Courtney Heys, MD    PT End of Session - 05/07/22 1449     Visit Number 20    Number of Visits 25    Date for PT Re-Evaluation 05/20/22    Authorization Type UHC Medicare    Progress Note Due on Visit 20    PT Start Time 1447    PT Stop Time 1530    PT Time Calculation (min) 43 min    Equipment Utilized During Treatment Gait belt    Activity Tolerance Patient tolerated treatment well    Behavior During Therapy WFL for tasks assessed/performed                Past Medical History:  Diagnosis Date    Multiple pulmonary nodules, largest R mid lung 06/29/2013   Followed in Pulmonary clinic/ [facility]/ [facility] - see CXR 06/27/13  - CT chest 07/14/2013 > 1. No acute findings in the thorax to account for the patient's  symptoms.  2. However, there is a subsolid nodule in the   right lower lobe that has a ground-glass attenuation component  measuring 2.5 x 1.8 cm, and a small central solid component  measuring 4 mm. Initial follow-up by chest CT without    Adenocarcinoma of lung, stage 1 ([place])    Status post right lower lobectomy August 2015   Anxiety    Asthma    Atrophic vaginitis    Chronic cough 12/20/2018   Chronic iritis of left eye    Pupil stays dilated; nonreactive   Chronic kidney disease, stage 3b (HCC)    Colon polyp    Contact lens/glasses fitting    wears contacts or glasses   Cough variant asthma with component of uacs  05/31/2013   Followed in Pulmonary clinic/ [facility]/ [facility] in her 73's - Spirometry 02/23/13 wnl  - 07/03/2013 Sinus CT > Mild chronic sinus disease - No acute findings. Left-to-right nasal septal deviation of 3 mm. -med calendar 08/08/13 , redone 06/01/2016 and 02/22/2017  -  Eos 4%   10/13/2013  > rec singulair daily  - FENO 05/12/2016  =   24 on singulair  - Allergy profile 05/12/2016 >   IgE  47 pos   Depression    Depression with anxiety    Dysuria 08/03/2019   Environmental allergies    Essential hypertension 02/23/2017   Changed losartan to avapro 02/22/2017 due to cough    GERD (gastroesophageal reflux disease)    Hypertension    Hypertriglyceridemia    Kidney stones    OA (osteoarthritis)    Osteopenia    PONV (postoperative nausea and vomiting)    S/P lobectomy of lung 02/12/2014   Status post total knee replacement, left 08/26/2018   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   Past Surgical History:  Procedure Laterality Date   APPENDECTOMY     COLONOSCOPY     EYE SURGERY Left    cataract removal   ORIF WRIST FRACTURE Left 08/04/2013   Procedure: OPEN REDUCTION INTERNAL FIXATION (ORIF) LEFT DISTAL RADIUS WRIST FRACTURE;  Surgeon: Wynonia Sours, MD;  Location: [place];  Service: Orthopedics;  Laterality: Left;   PARTIAL HYSTERECTOMY     TOTAL KNEE ARTHROPLASTY Left 08/26/2018   Procedure: TOTAL KNEE ARTHROPLASTY;  Surgeon: Netta Cedars, MD;  [facility];  Service: Orthopedics;  Laterality: Left;   VIDEO ASSISTED THORACOSCOPY (VATS)/WEDGE RESECTION Right 02/12/2014   Procedure: RIGHT VIDEO ASSISTED THORACOSCOPY RIGHT LOWER LOBE LUNG /WEDGE RESECTION,RIGHT THORACOTOMY WITH RIGHT LOWER LOBE LOBECTOMY & NODE DISSECTION;  Surgeon: Melrose Nakayama, MD;  Location: [place];  Service: Thoracic;  Laterality: Right;   VIDEO ASSISTED THORACOSCOPY (VATS)/WEDGE RESECTION Right 02/12/2014   VIDEO BRONCHOSCOPY N/A 02/12/2014   Procedure: VIDEO BRONCHOSCOPY;  Surgeon: Melrose Nakayama, MD;  Location: [place];  Service: Thoracic;  Laterality: N/A;   Patient Active Problem List   Diagnosis Date Noted   Acute ischemic left MCA stroke ([place]) 01/28/2022   Aphasia as late effect of cerebrovascular accident (CVA) 01/28/2022   Right hemiparesis ([place])  01/28/2022   Abnormal gait due to muscle weakness 01/28/2022   Insomnia 10/13/2021   Frequency of urination 10/13/2021   Vitamin D deficiency 10/13/2021   Chronic renal failure 10/13/2021   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[place]) 09/24/2021   Aphasia due to acute cerebrovascular accident (CVA) ([place]) 09/21/2021   Chronic kidney disease, stage 3b ([place]) 09/21/2021   Depression with anxiety 09/21/2021   Hypertension 09/21/2021   Asthma, chronic 09/21/2021   Fall at home, initial encounter 09/21/2021   Right ankle pain 09/21/2021   Dysuria 08/03/2019   Chronic cough 12/20/2018   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   Status post total knee replacement, left 08/26/2018   Essential hypertension 02/23/2017   Adenocarcinoma of lung, stage 1 ([place]) 05/08/2014   S/P lobectomy of lung 02/12/2014   Asthma 01/30/2014    Multiple pulmonary nodules, largest R mid lung 06/29/2013   Cough variant asthma with component of uacs  05/31/2013    ONSET DATE: 01/28/2022   REFERRING DIAG: G81.91 (ICD-10-CM) - Right hemiparesis (HCC) I63.512 (ICD-10-CM) - Acute ischemic left MCA stroke (HCC)   THERAPY DIAG:  Muscle weakness (generalized)  Other abnormalities of gait and mobility  Unsteadiness on feet  Rationale for Evaluation and Treatment Rehabilitation  SUBJECTIVE:                                                                                                                                                                                             SUBJECTIVE STATEMENT: Pt reports she is using her rollator at home to walk from her bedroom to her family room. Does not use rollator in the kitchen because she uses the counter-top intermittently. Pt reports she did work on her sidesteps at home after last visit.  Pt accompanied by: self   PERTINENT HISTORY: hx of hypertension, thrombotic stroke involving  left middle cerebral artery, asthma, lung cancer, coronary  calcification seen on CT and CKD   PAIN:  Are you having pain? No  PRECAUTIONS: Fall  PATIENT GOALS "to be able to walk without help and without pain"  OBJECTIVE:   TODAY'S TREATMENT: Ther Act  Discussed PT POC and remaining visits with focus on honing pt's current HEP and focusing on any remaining deficits that she wants to address.  THER EX: Reviewed/adjusted HEP: -mini-squats at countertop with improved ability to perform exercise correctly this date -4" step-ups at countertop  Added in tandem gait at countertop for balance, see bolded below   PATIENT EDUCATION: Education details: continue use of rollator at home; adjusted HEP and new handout provided Person educated: Patient Education method: Explanation, Demonstration, and Handouts Education comprehension: verbalized understanding and needs further education   HOME EXERCISE PROGRAM: Access Code: [email or web address] URL: [email or web address] Date: 02/10/2022 Prepared by: Excell Seltzer  Exercises - Side Stepping with Resistance at Ankles and Counter Support  - 1 x daily - 7 x weekly - 3 sets - 10 reps - Forward Step Up with Counter Support  - 1 x daily - 7 x weekly - 3 sets - 10 reps - Mini Squat with Counter Support  - 1 x daily - 7 x weekly - 3 sets - 10 reps - Tandem Walking with Counter Support  - 1 x daily - 7 x weekly - 1 sets - 10 reps   GOALS: Goals reviewed with patient? Yes   NEW LONG TERM GOALS: Target date: 05/20/2022  Pt will be independent with balance and strengthening HEP for improved strength, balance, transfers and gait. Baseline:  Goal status: IN PROGRESS  2.  Pt will improve gait velocity to at least 3.0 ft/sec for improved gait efficiency and performance at mod I level with LRAD Baseline: 2.26 ft/sec S* with RW (8/1), 2.35 ft/sec with RW (8/24), 2.1 ft/sec with no AD (10/3) Goal status: IN PROGRESS  3.  Pt will improve normal TUG to less than or equal to 12 seconds for improved  functional mobility and decreased fall risk. Baseline: 14 sec with RW (8/1), 11.91 sec with no AD (8/24), 12.6 sec with no AD (10/3) Goal status: IN PROGRESS  4.  Pt will improve Berg score to 46/56 for decreased fall risk Baseline: 34/56 (8/8), 40/56 (10/3) Goal status: IN PROGRESS  5.  Pt will improve 5 x STS to less than or equal to 13 seconds to demonstrate improved functional strength and transfer efficiency.  Baseline: 13.8 sec (8/1), 15.28 sec (8/24), 15.28 sec (10/3) Goal status: REVISED   ASSESSMENT:  CLINICAL IMPRESSION: Emphasis of skilled PT session on continuing to review HEP and adjust it to target pt's ongoing balance and strength deficits and reduce it to a manageable amount of exercises for patient to be able to complete. Pt exhibits improved ability to perform mini-squats correctly this date. Added in tandem gait at countertop to address balance impairments. Pt continues to benefit from skilled therapy services to address ongoing strength and balance deficits as well as decreased safety and independence with functional mobility. Continue POC.   OBJECTIVE IMPAIRMENTS Abnormal gait, decreased balance, decreased coordination, decreased mobility, decreased strength, decreased safety awareness, and pain.   ACTIVITY LIMITATIONS carrying, lifting, bending, squatting, stairs, transfers, bathing, dressing, and hygiene/grooming  PARTICIPATION LIMITATIONS: meal prep, cleaning, laundry, medication management, driving, shopping, community activity, yard work, and school  PERSONAL FACTORS hx of hypertension, thrombotic stroke involving left middle cerebral artery, asthma, lung cancer,  coronary calcification seen on CT and CKD  are also affecting patient's functional outcome.   REHAB POTENTIAL: Good  CLINICAL DECISION MAKING: Stable/uncomplicated  EVALUATION COMPLEXITY: Moderate  PLAN: PT FREQUENCY: 2x/week  PT DURATION: 8 weeks+ 8 visits (recert)  PLANNED INTERVENTIONS:  Therapeutic exercises, Therapeutic activity, Neuromuscular re-education, Balance training, Gait training, Patient/Family education, Self Care, Joint mobilization, Stair training, DME instructions, Electrical stimulation, Cryotherapy, Moist heat, Taping, Manual therapy, and Re-evaluation  PLAN FOR NEXT SESSION:  how is HEP going?, start to assess goals in preparation for d/c at end of next week, does pt have any other goals she wants addressed?   Excell Seltzer, PT, DPT, CSRS  05/07/2022, 3:36 PM

## 2022-05-12 ENCOUNTER — Ambulatory Visit: Payer: Medicare Other | Admitting: Speech Pathology

## 2022-05-12 ENCOUNTER — Ambulatory Visit: Payer: Medicare Other | Admitting: Occupational Therapy

## 2022-05-12 ENCOUNTER — Ambulatory Visit: Payer: Medicare Other | Admitting: Physical Therapy

## 2022-05-12 DIAGNOSIS — R4701 Aphasia: Secondary | ICD-10-CM | POA: Diagnosis not present

## 2022-05-12 DIAGNOSIS — R41841 Cognitive communication deficit: Secondary | ICD-10-CM

## 2022-05-12 DIAGNOSIS — M6281 Muscle weakness (generalized): Secondary | ICD-10-CM

## 2022-05-12 DIAGNOSIS — I69351 Hemiplegia and hemiparesis following cerebral infarction affecting right dominant side: Secondary | ICD-10-CM | POA: Diagnosis not present

## 2022-05-12 DIAGNOSIS — R2681 Unsteadiness on feet: Secondary | ICD-10-CM | POA: Diagnosis not present

## 2022-05-12 DIAGNOSIS — R278 Other lack of coordination: Secondary | ICD-10-CM | POA: Diagnosis not present

## 2022-05-12 DIAGNOSIS — Z9181 History of falling: Secondary | ICD-10-CM | POA: Diagnosis not present

## 2022-05-12 DIAGNOSIS — R482 Apraxia: Secondary | ICD-10-CM

## 2022-05-12 DIAGNOSIS — R2689 Other abnormalities of gait and mobility: Secondary | ICD-10-CM | POA: Diagnosis not present

## 2022-05-12 DIAGNOSIS — I63312 Cerebral infarction due to thrombosis of left middle cerebral artery: Secondary | ICD-10-CM | POA: Diagnosis not present

## 2022-05-12 DIAGNOSIS — I69190 Apraxia following nontraumatic intracerebral hemorrhage: Secondary | ICD-10-CM | POA: Diagnosis not present

## 2022-05-12 NOTE — Therapy (Signed)
OUTPATIENT SPEECH LANGUAGE PATHOLOGY TREATMENT (recert)    Patient Name: [name] MRN: [id number] DOB:1940/11/27, 81 y.o., female Today's Date: 05/12/2022  PCP: Hulan Fess, MD REFERRING PROVIDER: Courtney Heys, MD   End of Session - 05/12/22 1331     Visit Number 17    Number of Visits 25   +7 visits for recert   Date for SLP Re-Evaluation 06/09/22    Progress Note Due on Visit 13    SLP Start Time 1331   pt arrived late to session   SLP Stop Time  1400    SLP Time Calculation (min) 29 min    Activity Tolerance Patient tolerated treatment well                         Past Medical History:  Diagnosis Date    Multiple pulmonary nodules, largest R mid lung 06/29/2013   Followed in Pulmonary clinic/ [facility]/ [place] - see CXR 06/27/13  - CT chest 07/14/2013 > 1. No acute findings in the thorax to account for the patient's  symptoms.  2. However, there is a subsolid nodule in the   right lower lobe that has a ground-glass attenuation component  measuring 2.5 x 1.8 cm, and a small central solid component  measuring 4 mm. Initial follow-up by chest CT without    Adenocarcinoma of lung, stage 1 ([employer])    Status post right lower lobectomy August 2015   Anxiety    Asthma    Atrophic vaginitis    Chronic cough 12/20/2018   Chronic iritis of left eye    Pupil stays dilated; nonreactive   Chronic kidney disease, stage 3b ([facility])    Colon polyp    Contact lens/glasses fitting    wears contacts or glasses   Cough variant asthma with component of uacs  05/31/2013   Followed in Pulmonary clinic/ [facility]/ [place] in her 32's - Spirometry 02/23/13 wnl  - 07/03/2013 Sinus CT > Mild chronic sinus disease - No acute findings. Left-to-right nasal septal deviation of 3 mm. -med calendar 08/08/13 , redone 06/01/2016 and 02/22/2017  - Eos 4%   10/13/2013  > rec singulair daily  - FENO 05/12/2016  =   24 on singulair  - Allergy profile 05/12/2016 >   IgE  47 pos    Depression    Depression with anxiety    Dysuria 08/03/2019   Environmental allergies    Essential hypertension 02/23/2017   Changed losartan to avapro 02/22/2017 due to cough    GERD (gastroesophageal reflux disease)    Hypertension    Hypertriglyceridemia    Kidney stones    OA (osteoarthritis)    Osteopenia    PONV (postoperative nausea and vomiting)    S/P lobectomy of lung 02/12/2014   Status post total knee replacement, left 08/26/2018   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   Past Surgical History:  Procedure Laterality Date   APPENDECTOMY     COLONOSCOPY     EYE SURGERY Left    cataract removal   ORIF WRIST FRACTURE Left 08/04/2013   Procedure: OPEN REDUCTION INTERNAL FIXATION (ORIF) LEFT DISTAL RADIUS WRIST FRACTURE;  Surgeon: Wynonia Sours, MD;  Location: [place];  Service: Orthopedics;  Laterality: Left;   PARTIAL HYSTERECTOMY     TOTAL KNEE ARTHROPLASTY Left 08/26/2018   Procedure: TOTAL KNEE ARTHROPLASTY;  Surgeon: Netta Cedars, MD;  Location: [facility];  Service: Orthopedics;  Laterality: Left;  VIDEO ASSISTED THORACOSCOPY (VATS)/WEDGE RESECTION Right 02/12/2014   Procedure: RIGHT VIDEO ASSISTED THORACOSCOPY RIGHT LOWER LOBE LUNG /WEDGE RESECTION,RIGHT THORACOTOMY WITH RIGHT LOWER LOBE LOBECTOMY & NODE DISSECTION;  Surgeon: Melrose Nakayama, MD;  Location: [place];  Service: Thoracic;  Laterality: Right;   VIDEO ASSISTED THORACOSCOPY (VATS)/WEDGE RESECTION Right 02/12/2014   VIDEO BRONCHOSCOPY N/A 02/12/2014   Procedure: VIDEO BRONCHOSCOPY;  Surgeon: Melrose Nakayama, MD;  Location: [place];  Service: Thoracic;  Laterality: N/A;   Patient Active Problem List   Diagnosis Date Noted   Acute ischemic left MCA stroke ([place]) 01/28/2022   Aphasia as late effect of cerebrovascular accident (CVA) 01/28/2022   Right hemiparesis ([place]) 01/28/2022   Abnormal gait due to muscle weakness 01/28/2022   Insomnia 10/13/2021   Frequency of urination 10/13/2021    Vitamin D deficiency 10/13/2021   Chronic renal failure 10/13/2021   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[place]) 09/24/2021   Aphasia due to acute cerebrovascular accident (CVA) ([place]) 09/21/2021   Chronic kidney disease, stage 3b ([place]) 09/21/2021   Depression with anxiety 09/21/2021   Hypertension 09/21/2021   Asthma, chronic 09/21/2021   Fall at home, initial encounter 09/21/2021   Right ankle pain 09/21/2021   Dysuria 08/03/2019   Chronic cough 12/20/2018   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   Status post total knee replacement, left 08/26/2018   Essential hypertension 02/23/2017   Adenocarcinoma of lung, stage 1 ([place]) 05/08/2014   S/P lobectomy of lung 02/12/2014   Asthma 01/30/2014    Multiple pulmonary nodules, largest R mid lung 06/29/2013   Cough variant asthma with component of uacs  05/31/2013    ONSET DATE: March 2023, referral July 2023   REFERRING DIAG:  I69.320 (ICD-10-CM) - Aphasia as late effect of cerebrovascular accident (CVA)  I63.512 (ICD-10-CM) - Acute ischemic left MCA stroke ([place])    THERAPY DIAG:  Apraxia  Cognitive communication deficit  Aphasia  Rationale for Evaluation and Treatment Rehabilitation  SUBJECTIVE:   SUBJECTIVE STATEMENT: "It's been pretty good"   PAIN:  Are you having pain? No   OBJECTIVE:  TODAY'S TREATMENT:  05-12-22: Pt reports to having spoken to friend on the phone and friend commented on improved verbal communication. Pt did not bring cell phone or iPad, stating she did not know she had ST. Discussion on use of external aids to aid in management of schedule. Modified pt's schedule with key to ID speech vs physical vs occupational therapy. SLP led pt through moderately complex language task, given initial letter pt able to generate x5 items with occasional min-A, able to write 80% given rare min-A. Given word, pt able to accurately transcribe initial letter in 6/9 trials.   04-30-22: SLP facilitated  discussion on pt's personal goals for ST, with education on importance that therapy addresses those goals to increase motivation and positive outcomes. Pt expresses strong desire to work on her speech (apraxia and aphasia) as she finds her verbal expression difficulties very frustrating. Overall goal is increased independence and pt acknowledges need to work on increased independence in medication and schedule management. Additional needs ID are using phone or iPad as potential external cognitive aids or as device to A in written expression as pt has persisting agraphia. SLP collaborated with pt to generate new therapy goals and provided additional education on importance of HEP adherence. SLP counseled on self-advocacy in goal setting and HEP establishment to ensure pt understands how/why activities have been selected to help her meet her goals. Pt  verbalizes understanding.   PATIENT EDUCATION: Education details: see above Person educated: Patient Education method: Customer service manager Education comprehension: verbalized understanding, returned demonstration, and needs further education   GOALS: Goals reviewed with patient? Yes  SHORT TERM GOALS: Target date: 03/03/22  Pt will verbalize 7 items in personally relevant categories with occasional min-A over 2 therapy sessions Baseline: 02-24-22; 02/26/22 Goal status: MET  2.  Pt will correct semantic paraphasia given usual mod-A in 70% of opportunities during structured task over 2 sessions.  Baseline: 02-24-22; 02-26-22 Goal status: MET  3.  Pt will write x5 item list with 80% accuracy, given usual mod-A over 2 sessions.  Baseline: 02-24-22; 02-26-22 Goal status: NOT MET  4.  Pt will successfully use trained anomia compensation in x3 opportunities across 2 therapy sessions with occasioanl min-A.  Baseline: 02-24-22; 02-26-22 Goal status: PARTIALLY MET   LONG TERM GOALS: Target date: 06/09/2022 (+6 weeks for recert, new goals  added)  Pt will identify, and correct, 50% of semantic paraphasias during structured task with occasional min-A over 2 sessions.  Baseline: 04-30-22 Goal status: MET  2.  Pt will report improvement of 2 points via CPIB PROM by d/c Baseline: 15 Goal status: IN PROGRESS  3.  Pt will carryover dysnomia compensations to conversational speech, resulting in strategy use in 70% of opportunities over 15 minute conversation Baseline: 04-28-22 Goal status: partially met  4.  Pt will write x10 phrases, with mod-I, with 80% accuracy over 2 sessions.   Baseline:  Goal status: not met  5.  Pt will carryover compensations for increased indpendence in home based tasks, resulting in successful completion of pt selected tasks over 1 week period Baseline: 04-28-22  Goal status: met  5.  Pt will teach back memory compensations to aid in recall of information discussed in conversations with rare min-A Baseline:   Goal status: new at recert  6.  Pt will use external cognitive aid to A in increased independence in schedule and medication management with mod-I  Goal status: new at recert  7.  Pt will use cell phone or iPad to generate grocery list of x10 items with supervision A  Goal status: new at recert   8.  Pt will complete moderately complex language task with 80% accuracy with rare min-A  Goal status: new at recert  9.  Pt will read 2 paragraph stimuli with employment of apraxia strategies, requiring no more than x3 A from SLP for correction  Goal status: new at recert  ASSESSMENT:  CLINICAL IMPRESSION: Patient is a 81 y.o. F presenting with mild-moderate expressive aphasia. Moderate improvement in discourse level speech, though intermittent vague language persists. Occasional anomia, with use of anomia strategies ~50% of opportunities. Decreased instances of paraphasias with increased awareness and ability to correct. Written expression impaired, yet improving at word level. SLP has  initiated skilled interventions and strategies to aid in abilites for desired functional tasks at home such as grocery list creation. SLP has initiated structured language tasks to aid in improved expressive language and enhanced communication confidence and efficacy. Additional therapy goals added per daughter concerns re: cognition impacting successful participation in home based activities. Initiated training of external cognitive assistive device as memory aid and caregiver education for appropriate cuing for routine changes to encourage independence in pt selected task of med administration.   OBJECTIVE IMPAIRMENTS include expressive language, aphasia, and apraxia. These impairments are limiting patient from effectively communicating at home and in community. Factors affecting potential to achieve  goals and functional outcome are family/community support. Patient will benefit from skilled SLP services to address above impairments and improve overall function.  REHAB POTENTIAL: Good  PLAN: SLP FREQUENCY: 2x/week  SLP DURATION: 8 weeks  PLANNED INTERVENTIONS: Language facilitation, Cueing hierachy, Internal/external aids, Functional tasks, Multimodal communication approach, SLP instruction and feedback, Compensatory strategies, and Patient/family education    Su Monks, CCC-SLP 05/12/2022, 1:31 PM

## 2022-05-12 NOTE — Patient Instructions (Signed)
Think of the answer and try writing it out. If you have trouble spelling, try using your phone to look up how to spell the word:   This animal is black and white, is from [place], and eats bamboo.  This sport involves a team kicking a ball into the other team's net.  This famous structure is found in [place], is built of iron, and has restaurants at the top.  This job involves putting on a uniform and battling fires.  This flower grows on a bush, has thorns, and is known for its pleasant fragrance.  This famous business person founded [employer], and [employer], commercial.  This country is an [place], is close to [place], and its people are known as Herbalist."  This event happens on the same date every year to celebrate the day a person was born.  This is worn on your face, has a prescription, and helps you see better.  These natural disasters are giant waves caused by earthquakes or volcanic eruptions at sea.   Name 2-3 items in each category  [place]

## 2022-05-12 NOTE — Therapy (Signed)
OUTPATIENT PHYSICAL THERAPY NEURO THERAPY   Patient Name: [name] MRN: [id number] DOB:13-Nov-1940, 81 y.o., female Today's Date: 05/12/2022   PCP: Pt unable to provide information and unable to locate in chart. REFERRING PROVIDER: Courtney Heys, MD    PT End of Session - 05/12/22 1404     Visit Number 21    Number of Visits 25    Date for PT Re-Evaluation 05/20/22    Authorization Type [employer] Medicare    Progress Note Due on Visit 20    PT Start Time 1400    PT Stop Time 1443    PT Time Calculation (min) 43 min    Equipment Utilized During Treatment Gait belt    Activity Tolerance Patient tolerated treatment well    Behavior During Therapy WFL for tasks assessed/performed                 Past Medical History:  Diagnosis Date    Multiple pulmonary nodules, largest R mid lung 06/29/2013   Followed in Pulmonary clinic/ [facility]/ [place] - see CXR 06/27/13  - CT chest 07/14/2013 > 1. No acute findings in the thorax to account for the patient's  symptoms.  2. However, there is a subsolid nodule in the   right lower lobe that has a ground-glass attenuation component  measuring 2.5 x 1.8 cm, and a small central solid component  measuring 4 mm. Initial follow-up by chest CT without    Adenocarcinoma of lung, stage 1 ([place])    Status post right lower lobectomy August 2015   Anxiety    Asthma    Atrophic vaginitis    Chronic cough 12/20/2018   Chronic iritis of left eye    Pupil stays dilated; nonreactive   Chronic kidney disease, stage 3b (HCC)    Colon polyp    Contact lens/glasses fitting    wears contacts or glasses   Cough variant asthma with component of uacs  05/31/2013   Followed in Pulmonary clinic/ [facility]/ [place] in her 9's - Spirometry 02/23/13 wnl  - 07/03/2013 Sinus CT > Mild chronic sinus disease - No acute findings. Left-to-right nasal septal deviation of 3 mm. -med calendar 08/08/13 , redone 06/01/2016 and 02/22/2017  - Eos 4%    10/13/2013  > rec singulair daily  - FENO 05/12/2016  =   24 on singulair  - Allergy profile 05/12/2016 >   IgE  47 pos   Depression    Depression with anxiety    Dysuria 08/03/2019   Environmental allergies    Essential hypertension 02/23/2017   Changed losartan to avapro 02/22/2017 due to cough    GERD (gastroesophageal reflux disease)    Hypertension    Hypertriglyceridemia    Kidney stones    OA (osteoarthritis)    Osteopenia    PONV (postoperative nausea and vomiting)    S/P lobectomy of lung 02/12/2014   Status post total knee replacement, left 08/26/2018   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   Past Surgical History:  Procedure Laterality Date   APPENDECTOMY     COLONOSCOPY     EYE SURGERY Left    cataract removal   ORIF WRIST FRACTURE Left 08/04/2013   Procedure: OPEN REDUCTION INTERNAL FIXATION (ORIF) LEFT DISTAL RADIUS WRIST FRACTURE;  Surgeon: Wynonia Sours, MD;  Location: [facility];  Service: Orthopedics;  Laterality: Left;   PARTIAL HYSTERECTOMY     TOTAL KNEE ARTHROPLASTY Left 08/26/2018   Procedure: TOTAL KNEE ARTHROPLASTY;  Surgeon: Netta Cedars, MD;  Location: [facility];  Service: Orthopedics;  Laterality: Left;   VIDEO ASSISTED THORACOSCOPY (VATS)/WEDGE RESECTION Right 02/12/2014   Procedure: RIGHT VIDEO ASSISTED THORACOSCOPY RIGHT LOWER LOBE LUNG /WEDGE RESECTION,RIGHT THORACOTOMY WITH RIGHT LOWER LOBE LOBECTOMY & NODE DISSECTION;  Surgeon: Melrose Nakayama, MD;  Location: [facility];  Service: Thoracic;  Laterality: Right;   VIDEO ASSISTED THORACOSCOPY (VATS)/WEDGE RESECTION Right 02/12/2014   VIDEO BRONCHOSCOPY N/A 02/12/2014   Procedure: VIDEO BRONCHOSCOPY;  Surgeon: Melrose Nakayama, MD;  Location: [facility];  Service: Thoracic;  Laterality: N/A;   Patient Active Problem List   Diagnosis Date Noted   Acute ischemic left MCA stroke ([place]) 01/28/2022   Aphasia as late effect of cerebrovascular accident (CVA) 01/28/2022   Right hemiparesis ([place]) 01/28/2022    Abnormal gait due to muscle weakness 01/28/2022   Insomnia 10/13/2021   Frequency of urination 10/13/2021   Vitamin D deficiency 10/13/2021   Chronic renal failure 10/13/2021   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[place]) 09/24/2021   Aphasia due to acute cerebrovascular accident (CVA) ([place]) 09/21/2021   Chronic kidney disease, stage 3b ([place]) 09/21/2021   Depression with anxiety 09/21/2021   Hypertension 09/21/2021   Asthma, chronic 09/21/2021   Fall at home, initial encounter 09/21/2021   Right ankle pain 09/21/2021   Dysuria 08/03/2019   Chronic cough 12/20/2018   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   Status post total knee replacement, left 08/26/2018   Essential hypertension 02/23/2017   Adenocarcinoma of lung, stage 1 ([place]) 05/08/2014   S/P lobectomy of lung 02/12/2014   Asthma 01/30/2014    Multiple pulmonary nodules, largest R mid lung 06/29/2013   Cough variant asthma with component of uacs  05/31/2013    ONSET DATE: 01/28/2022   REFERRING DIAG: G81.91 (ICD-10-CM) - Right hemiparesis (HCC) I63.512 (ICD-10-CM) - Acute ischemic left MCA stroke (HCC)   THERAPY DIAG:  Muscle weakness (generalized)  Other abnormalities of gait and mobility  Unsteadiness on feet  History of falling  Rationale for Evaluation and Treatment Rehabilitation  SUBJECTIVE:                                                                                                                                                                                             SUBJECTIVE STATEMENT: Pt reports feeling very "over all of it" with regards to therapy and recovering from her stroke. Pt brings in her 4" step that she got in the mail for her HEP. Pt also brings in her HEP handout to review. No pain today, no falls since last visit.  Pt accompanied by: self  PERTINENT HISTORY: hx of hypertension, thrombotic stroke involving left middle cerebral artery, asthma, lung cancer,  coronary calcification seen on CT and CKD   PAIN:  Are you having pain? No  PRECAUTIONS: Fall  PATIENT GOALS "to be able to walk without help and without pain"  OBJECTIVE:   TODAY'S TREATMENT: THER ACT:  [facility] PT Assessment - 05/12/22 1423       Standardized Balance Assessment   Standardized Balance Assessment Timed Up and Go Test;Five Times Sit to Stand;Berg Balance Test      Berg Balance Test   Sit to Stand Able to stand without using hands and stabilize independently    Standing Unsupported Able to stand safely 2 minutes    Sitting with Back Unsupported but Feet Supported on Floor or Stool Able to sit safely and securely 2 minutes    Stand to Sit Sits safely with minimal use of hands    Transfers Able to transfer safely, minor use of hands    Standing Unsupported with Eyes Closed Able to stand 10 seconds with supervision    Standing Unsupported with Feet Together Able to place feet together independently and stand for 1 minute with supervision    From Standing, Reach Forward with Outstretched Arm Can reach forward >12 cm safely (5")    From Standing Position, Pick up Object from [place] to pick up shoe safely and easily    From Standing Position, Turn to Look Behind Over each Shoulder Looks behind from both sides and weight shifts well    Turn 360 Degrees Able to turn 360 degrees safely but slowly   6 sec each side   Standing Unsupported, Alternately Place Feet on Step/Stool Able to complete >2 steps/needs minimal assist    Standing Unsupported, One Foot in Front Able to take small step independently and hold 30 seconds    Standing on One Leg Tries to lift leg/unable to hold 3 seconds but remains standing independently    Total Score 43    Berg comment: 43/56, significant fall risk      Timed Up and Go Test   TUG Normal TUG    Normal TUG (seconds) 18.53   with rollator; 15 sec with no AD            Reviewed HEP with 4" step at countertop, pt needs close Supervision  to [facility] for safety/balance with this exercise. Encouraged pt to perform this exercise with assist from her caregiver at home, pt agreeable.  GAIT: Gait pattern: decreased step length- Right, decreased step length- Left, decreased hip/knee flexion- Right, decreased hip/knee flexion- Left, decreased ankle dorsiflexion- Right, and decreased ankle dorsiflexion- Left Distance walked: 115 ft Assistive device utilized: None Level of assistance: CGA Comments: trial gait no AD; pt exhibits improved balance as compared to prior sessions with no AD  Gait through obstacle course weaving through cones with focus on safe rollator management as pt tends to run into obstacles with her rollator, Supervision for balance and min cues for safety and attention to environment.  PATIENT EDUCATION: Education details: continue use of rollator at home; reviewed HEP Person educated: Patient Education method: Customer service manager Education comprehension: verbalized understanding and needs further education   HOME EXERCISE PROGRAM: Access Code: [email or web address] URL: [email or web address] Date: 02/10/2022 Prepared by: Excell Seltzer  Exercises - Side Stepping with Resistance at Ankles and Counter Support  - 1 x daily - 7 x weekly - 3 sets - 10 reps - Forward Step Up with Counter Support  -  1 x daily - 7 x weekly - 3 sets - 10 reps - Mini Squat with Counter Support  - 1 x daily - 7 x weekly - 3 sets - 10 reps - Tandem Walking with Counter Support  - 1 x daily - 7 x weekly - 1 sets - 10 reps   GOALS: Goals reviewed with patient? Yes   NEW LONG TERM GOALS: Target date: 05/20/2022  Pt will be independent with balance and strengthening HEP for improved strength, balance, transfers and gait. Baseline:  Goal status: IN PROGRESS  2.  Pt will improve gait velocity to at least 3.0 ft/sec for improved gait efficiency and performance at mod I level with LRAD Baseline: 2.26 ft/sec S* with RW (8/1), 2.35  ft/sec with RW (8/24), 2.1 ft/sec with no AD (10/3) Goal status: IN PROGRESS  3.  Pt will improve normal TUG to less than or equal to 12 seconds for improved functional mobility and decreased fall risk. Baseline: 14 sec with RW (8/1), 11.91 sec with no AD (8/24), 12.6 sec with no AD (10/3), 18.53 sec with rollator (11/7) Goal status: NOT MET  4.  Pt will improve Berg score to 46/56 for decreased fall risk Baseline: 34/56 (8/8), 40/56 (10/3), 43/56 (11/7) Goal status: NOT MET BUT PROGRESSED  5.  Pt will improve 5 x STS to less than or equal to 13 seconds to demonstrate improved functional strength and transfer efficiency.  Baseline: 13.8 sec (8/1), 15.28 sec (8/24), 15.28 sec (10/3) Goal status: REVISED   ASSESSMENT:  CLINICAL IMPRESSION: Emphasis of skilled PT session on reviewing HEP again with patient and initiating LTG assessment in preparation for d/c from PT next visit. Pt brings in her 4" step from home that she ordered in order to use for her HEP. Pt does need Supervision to CGA for safe performance of her HEP and recommending she have her caregiver with her when performing it. Pt showed improvement in her balance and decreased fall risk as evidence by improvement in her BBS score from 34/56 on 8/8 to 43/56 this date, however she did not quite improve enough to meet her goal of 46/56. Additionally, pt exhibits an increased time on the TUG from 12.6 sec upon last assessment to 18.53 sec this date with use of rollator. Recommending that pt use her rollator at home for increased safety but pt also continues to require increased time and cues for safe brake management. Continue POC.   OBJECTIVE IMPAIRMENTS Abnormal gait, decreased balance, decreased coordination, decreased mobility, decreased strength, decreased safety awareness, and pain.   ACTIVITY LIMITATIONS carrying, lifting, bending, squatting, stairs, transfers, bathing, dressing, and hygiene/grooming  PARTICIPATION LIMITATIONS:  meal prep, cleaning, laundry, medication management, driving, shopping, community activity, yard work, and school  PERSONAL FACTORS hx of hypertension, thrombotic stroke involving left middle cerebral artery, asthma, lung cancer, coronary calcification seen on CT and CKD  are also affecting patient's functional outcome.   REHAB POTENTIAL: Good  CLINICAL DECISION MAKING: Stable/uncomplicated  EVALUATION COMPLEXITY: Moderate  PLAN: PT FREQUENCY: 2x/week  PT DURATION: 8 weeks+ 8 visits (recert)  PLANNED INTERVENTIONS: Therapeutic exercises, Therapeutic activity, Neuromuscular re-education, Balance training, Gait training, Patient/Family education, Self Care, Joint mobilization, Stair training, DME instructions, Electrical stimulation, Cryotherapy, Moist heat, Taping, Manual therapy, and Re-evaluation  PLAN FOR NEXT SESSION:  Assess remainder of LTG and d/c from PT   Excell Seltzer, PT, DPT, CSRS  05/12/2022, 2:44 PM

## 2022-05-14 ENCOUNTER — Encounter: Payer: Self-pay | Admitting: Physical Therapy

## 2022-05-14 ENCOUNTER — Ambulatory Visit: Payer: Medicare Other | Admitting: Physical Therapy

## 2022-05-14 NOTE — Therapy (Unsigned)
[facility] [address] [place], [place], [address] Phone: [phone]   Fax:  [phone]  Patient Details  Name: KHANDI KERNES MRN: [id number] Date of Birth: 12/21/1940 Referring Provider:  No ref. provider found  Encounter Date: 05/14/2022  PHYSICAL THERAPY DISCHARGE SUMMARY  Visits from Start of Care: 21  Current functional level related to goals / functional outcomes: Mod I with rollator   Remaining deficits: Decreased balance, decreased LE strength, decreased insight into deficits and functional implications   Education / Equipment: Handout for HEP   Patient agrees to discharge. Patient goals were partially met. Patient is being discharged due to not returning since the last visit.   Pt cancelled last scheduled PT appointment, d/c from PT.   Excell Seltzer, PT, DPT, CSRS 05/14/2022, 1:44 PM  [facility] [address] [place], [place], [address] Phone: [phone]   Fax:  [phone]

## 2022-05-14 NOTE — Therapy (Incomplete)
OUTPATIENT PHYSICAL THERAPY NEURO [place] NOTE   Patient Name: [name] MRN: [id number] DOB:12-19-1940, 81 y.o., female Today's Date: 05/14/2022   PCP: Pt unable to provide information and unable to locate in chart. REFERRING PROVIDER: Courtney Heys, MD   PHYSICAL THERAPY DISCHARGE SUMMARY  Visits from Start of Care: ***  Current functional level related to goals / functional outcomes: ***   Remaining deficits: ***   Education / Equipment: ***   Patient agrees to discharge. Patient goals were {OP Goals:25702::"met"}. Patient is being discharged due to {OP Discharge Reasons:25703::"meeting the stated rehab goals."}          Past Medical History:  Diagnosis Date    Multiple pulmonary nodules, largest R mid lung 06/29/2013   Followed in Pulmonary clinic/ [facility]/ [place] - see CXR 06/27/13  - CT chest 07/14/2013 > 1. No acute findings in the thorax to account for the patient's  symptoms.  2. However, there is a subsolid nodule in the   right lower lobe that has a ground-glass attenuation component  measuring 2.5 x 1.8 cm, and a small central solid component  measuring 4 mm. Initial follow-up by chest CT without    Adenocarcinoma of lung, stage 1 ([place])    Status post right lower lobectomy August 2015   Anxiety    Asthma    Atrophic vaginitis    Chronic cough 12/20/2018   Chronic iritis of left eye    Pupil stays dilated; nonreactive   Chronic kidney disease, stage 3b (HCC)    Colon polyp    Contact lens/glasses fitting    wears contacts or glasses   Cough variant asthma with component of uacs  05/31/2013   Followed in Pulmonary clinic/ [facility]/ [place] [facility] in her 83's - Spirometry 02/23/13 wnl  - 07/03/2013 Sinus CT > Mild chronic sinus disease - No acute findings. Left-to-right nasal septal deviation of 3 mm. -med calendar 08/08/13 , redone 06/01/2016 and 02/22/2017  - Eos 4%   10/13/2013  > rec singulair daily  - FENO 05/12/2016  =   24 on  singulair  - Allergy profile 05/12/2016 >   IgE  47 pos   Depression    Depression with anxiety    Dysuria 08/03/2019   Environmental allergies    Essential hypertension 02/23/2017   Changed losartan to avapro 02/22/2017 due to cough    GERD (gastroesophageal reflux disease)    Hypertension    Hypertriglyceridemia    Kidney stones    OA (osteoarthritis)    Osteopenia    PONV (postoperative nausea and vomiting)    S/P lobectomy of lung 02/12/2014   Status post total knee replacement, left 08/26/2018   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   Past Surgical History:  Procedure Laterality Date   APPENDECTOMY     COLONOSCOPY     EYE SURGERY Left    cataract removal   ORIF WRIST FRACTURE Left 08/04/2013   Procedure: OPEN REDUCTION INTERNAL FIXATION (ORIF) LEFT DISTAL RADIUS WRIST FRACTURE;  Surgeon: Wynonia Sours, MD;  Location: [facility];  Service: Orthopedics;  Laterality: Left;   PARTIAL HYSTERECTOMY     TOTAL KNEE ARTHROPLASTY Left 08/26/2018   Procedure: TOTAL KNEE ARTHROPLASTY;  Surgeon: Netta Cedars, MD;  [facility];  Service: Orthopedics;  Laterality: Left;   VIDEO ASSISTED THORACOSCOPY (VATS)/WEDGE RESECTION Right 02/12/2014   Procedure: RIGHT VIDEO ASSISTED THORACOSCOPY RIGHT LOWER LOBE LUNG /WEDGE RESECTION,RIGHT THORACOTOMY WITH RIGHT LOWER LOBE LOBECTOMY & NODE DISSECTION;  Surgeon: Melrose Nakayama, MD;  Location: [facility];  Service: Thoracic;  Laterality: Right;   VIDEO ASSISTED THORACOSCOPY (VATS)/WEDGE RESECTION Right 02/12/2014   VIDEO BRONCHOSCOPY N/A 02/12/2014   Procedure: VIDEO BRONCHOSCOPY;  Surgeon: Melrose Nakayama, MD;  Location: [facility];  Service: Thoracic;  Laterality: N/A;   Patient Active Problem List   Diagnosis Date Noted   Acute ischemic left MCA stroke ([place]) 01/28/2022   Aphasia as late effect of cerebrovascular accident (CVA) 01/28/2022   Right hemiparesis ([place]) 01/28/2022   Abnormal gait due to muscle weakness 01/28/2022    Insomnia 10/13/2021   Frequency of urination 10/13/2021   Vitamin D deficiency 10/13/2021   Chronic renal failure 10/13/2021   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[place]) 09/24/2021   Aphasia due to acute cerebrovascular accident (CVA) ([place]) 09/21/2021   Chronic kidney disease, stage 3b ([place]) 09/21/2021   Depression with anxiety 09/21/2021   Hypertension 09/21/2021   Asthma, chronic 09/21/2021   Fall at home, initial encounter 09/21/2021   Right ankle pain 09/21/2021   Dysuria 08/03/2019   Chronic cough 12/20/2018   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   Status post total knee replacement, left 08/26/2018   Essential hypertension 02/23/2017   Adenocarcinoma of lung, stage 1 ([place]) 05/08/2014   S/P lobectomy of lung 02/12/2014   Asthma 01/30/2014    Multiple pulmonary nodules, largest R mid lung 06/29/2013   Cough variant asthma with component of uacs  05/31/2013    ONSET DATE: 01/28/2022   REFERRING DIAG: G81.91 (ICD-10-CM) - Right hemiparesis (HCC) I63.512 (ICD-10-CM) - Acute ischemic left MCA stroke (HCC)   THERAPY DIAG:  No diagnosis found.  Rationale for Evaluation and Treatment Rehabilitation  SUBJECTIVE:                                                                                                                                                                                             SUBJECTIVE STATEMENT: ***  Pt accompanied by: self   PERTINENT HISTORY: hx of hypertension, thrombotic stroke involving left middle cerebral artery, asthma, lung cancer, coronary calcification seen on CT and CKD   PAIN:  Are you having pain? No  PRECAUTIONS: Fall  PATIENT GOALS "to be able to walk without help and without pain"  OBJECTIVE:   TODAY'S TREATMENT: THER ACT:    ***  GAIT: Gait pattern: decreased step length- Right, decreased step length- Left, decreased hip/knee flexion- Right, decreased hip/knee flexion- Left, decreased ankle  dorsiflexion- Right, and decreased ankle dorsiflexion- Left Distance walked: 115 ft Assistive device utilized: None Level of assistance: CGA Comments: trial gait no AD; pt exhibits  improved balance as compared to prior sessions with no AD  Gait through obstacle course weaving through cones with focus on safe rollator management as pt tends to run into obstacles with her rollator, Supervision for balance and min cues for safety and attention to environment.  ***  PATIENT EDUCATION: Education details: continue use of rollator at home; reviewed HEP*** Person educated: Patient Education method: Explanation and Demonstration Education comprehension: verbalized understanding and needs further education   HOME EXERCISE PROGRAM: Access Code: [email or web address] URL: [email or web address] Date: 02/10/2022 Prepared by: Excell Seltzer  Exercises - Side Stepping with Resistance at Ankles and Counter Support  - 1 x daily - 7 x weekly - 3 sets - 10 reps - Forward Step Up with Counter Support  - 1 x daily - 7 x weekly - 3 sets - 10 reps - Mini Squat with Counter Support  - 1 x daily - 7 x weekly - 3 sets - 10 reps - Tandem Walking with Counter Support  - 1 x daily - 7 x weekly - 1 sets - 10 reps   GOALS: Goals reviewed with patient? Yes   NEW LONG TERM GOALS: Target date: 05/20/2022***  Pt will be independent with balance and strengthening HEP for improved strength, balance, transfers and gait. Baseline:  Goal status: IN PROGRESS  2.  Pt will improve gait velocity to at least 3.0 ft/sec for improved gait efficiency and performance at mod I level with LRAD Baseline: 2.26 ft/sec S* with RW (8/1), 2.35 ft/sec with RW (8/24), 2.1 ft/sec with no AD (10/3) Goal status: IN PROGRESS  3.  Pt will improve normal TUG to less than or equal to 12 seconds for improved functional mobility and decreased fall risk. Baseline: 14 sec with RW (8/1), 11.91 sec with no AD (8/24), 12.6 sec with no AD  (10/3), 18.53 sec with rollator (11/7) Goal status: NOT MET  4.  Pt will improve Berg score to 46/56 for decreased fall risk Baseline: 34/56 (8/8), 40/56 (10/3), 43/56 (11/7) Goal status: NOT MET BUT PROGRESSED  5.  Pt will improve 5 x STS to less than or equal to 13 seconds to demonstrate improved functional strength and transfer efficiency.  Baseline: 13.8 sec (8/1), 15.28 sec (8/24), 15.28 sec (10/3) Goal status: REVISED   ASSESSMENT:  CLINICAL IMPRESSION: Emphasis of skilled PT session on *** Continue POC.   OBJECTIVE IMPAIRMENTS Abnormal gait, decreased balance, decreased coordination, decreased mobility, decreased strength, decreased safety awareness, and pain.   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   REHAB POTENTIAL: Good  CLINICAL DECISION MAKING: Stable/uncomplicated  EVALUATION COMPLEXITY: Moderate  PLAN: PT FREQUENCY: 2x/week  PT DURATION: 8 weeks+ 8 visits (recert)  PLANNED INTERVENTIONS: Therapeutic exercises, Therapeutic activity, Neuromuscular re-education, Balance training, Gait training, Patient/Family education, Self Care, Joint mobilization, Stair training, DME instructions, Electrical stimulation, Cryotherapy, Moist heat, Taping, Manual therapy, and Re-evaluation  PLAN FOR NEXT SESSION:  Assess remainder of LTG and d/c from PT***   Excell Seltzer, PT, DPT, CSRS  05/14/2022, 9:49 AM

## 2022-05-15 NOTE — Therapy (Deleted)
OUTPATIENT OCCUPATIONAL THERAPY NEURO TREATMENT  Patient Name: [name] MRN: [id number] DOB:[birth year], 81 y.o., female Today's Date: 05/15/2022  PCP: NA REFERRING PROVIDER: Courtney Heys, MD          Past Medical History:  Diagnosis Date    Multiple pulmonary nodules, largest R mid lung 06/29/2013   Followed in Pulmonary clinic/ [facility]/ [facility] - see CXR 06/27/13  - CT chest 07/14/2013 > 1. No acute findings in the thorax to account for the patient's  symptoms.  2. However, there is a subsolid nodule in the   right lower lobe that has a ground-glass attenuation component  measuring 2.5 x 1.8 cm, and a small central solid component  measuring 4 mm. Initial follow-up by chest CT without    Adenocarcinoma of lung, stage 1 ([place])    Status post right lower lobectomy August 2015   Anxiety    Asthma    Atrophic vaginitis    Chronic cough 12/20/2018   Chronic iritis of left eye    Pupil stays dilated; nonreactive   Chronic kidney disease, stage 3b (HCC)    Colon polyp    Contact lens/glasses fitting    wears contacts or glasses   Cough variant asthma with component of uacs  05/31/2013   Followed in Pulmonary clinic/ [facility]/ [facility] in her 22's - Spirometry 02/23/13 wnl  - 07/03/2013 Sinus CT > Mild chronic sinus disease - No acute findings. Left-to-right nasal septal deviation of 3 mm. -med calendar 08/08/13 , redone 06/01/2016 and 02/22/2017  - Eos 4%   10/13/2013  > rec singulair daily  - FENO 05/12/2016  =   24 on singulair  - Allergy profile 05/12/2016 >   IgE  47 pos   Depression    Depression with anxiety    Dysuria 08/03/2019   Environmental allergies    Essential hypertension 02/23/2017   Changed losartan to avapro 02/22/2017 due to cough    GERD (gastroesophageal reflux disease)    Hypertension    Hypertriglyceridemia    Kidney stones    OA (osteoarthritis)    Osteopenia    PONV (postoperative nausea and vomiting)    S/P lobectomy of lung  02/12/2014   Status post total knee replacement, left 08/26/2018   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   Past Surgical History:  Procedure Laterality Date   APPENDECTOMY     COLONOSCOPY     EYE SURGERY Left    cataract removal   ORIF WRIST FRACTURE Left 08/04/2013   Procedure: OPEN REDUCTION INTERNAL FIXATION (ORIF) LEFT DISTAL RADIUS WRIST FRACTURE;  Surgeon: Wynonia Sours, MD;  Location: [place];  Service: Orthopedics;  Laterality: Left;   PARTIAL HYSTERECTOMY     TOTAL KNEE ARTHROPLASTY Left 08/26/2018   Procedure: TOTAL KNEE ARTHROPLASTY;  Surgeon: Netta Cedars, MD;  Location: [facility];  Service: Orthopedics;  Laterality: Left;   VIDEO ASSISTED THORACOSCOPY (VATS)/WEDGE RESECTION Right 02/12/2014   Procedure: RIGHT VIDEO ASSISTED THORACOSCOPY RIGHT LOWER LOBE LUNG /WEDGE RESECTION,RIGHT THORACOTOMY WITH RIGHT LOWER LOBE LOBECTOMY & NODE DISSECTION;  Surgeon: Melrose Nakayama, MD;  Location: [place];  Service: Thoracic;  Laterality: Right;   VIDEO ASSISTED THORACOSCOPY (VATS)/WEDGE RESECTION Right 02/12/2014   VIDEO BRONCHOSCOPY N/A 02/12/2014   Procedure: VIDEO BRONCHOSCOPY;  Surgeon: Melrose Nakayama, MD;  Location: [place];  Service: Thoracic;  Laterality: N/A;   Patient Active Problem List   Diagnosis Date Noted   Acute ischemic left MCA stroke ([place]) 01/28/2022  Aphasia as late effect of cerebrovascular accident (CVA) 01/28/2022   Right hemiparesis ([place]) 01/28/2022   Abnormal gait due to muscle weakness 01/28/2022   Insomnia 10/13/2021   Frequency of urination 10/13/2021   Vitamin D deficiency 10/13/2021   Chronic renal failure 10/13/2021   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[place]) 09/24/2021   Aphasia due to acute cerebrovascular accident (CVA) ([place]) 09/21/2021   Chronic kidney disease, stage 3b ([place]) 09/21/2021   Depression with anxiety 09/21/2021   Hypertension 09/21/2021   Asthma, chronic 09/21/2021   Fall at home, initial  encounter 09/21/2021   Right ankle pain 09/21/2021   Dysuria 08/03/2019   Chronic cough 12/20/2018   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   Status post total knee replacement, left 08/26/2018   Essential hypertension 02/23/2017   Adenocarcinoma of lung, stage 1 ([place]) 05/08/2014   S/P lobectomy of lung 02/12/2014   Asthma 01/30/2014    Multiple pulmonary nodules, largest R mid lung 06/29/2013   Cough variant asthma with component of uacs  05/31/2013    ONSET DATE: 01/28/2022   REFERRING DIAG: G81.91 (ICD-10-CM) - Right hemiparesis (HCC) I63.512 (ICD-10-CM) - Acute ischemic left MCA stroke (HCC)   THERAPY DIAG:  No diagnosis found.  Rationale for Evaluation and Treatment Rehabilitation  SUBJECTIVE:   SUBJECTIVE STATEMENT: Daughter  is coming week of 05/18/22.   Pt accompanied by: self  PERTINENT HISTORY:  hx of hypertension, thrombotic stroke involving left middle cerebral artery, asthma, lung cancer, coronary calcification seen on CT and CKD    PRECAUTIONS: Fall  WEIGHT BEARING RESTRICTIONS No  PAIN:  Are you having pain? NO  FALLS: Has patient fallen in last 6 months? Yes [place] last week.  Fell backward, could not get up without help  LIVING ENVIRONMENT: Lives with: lives alone with aide/caregiver Mon-Fri Lives in: House/apartment (one level home w/ 1 step to enter)   Has following equipment at home: Environmental consultant - 4 wheeled, Electronics engineer, and Grab bars  PLOF: Independent with basic ADLs  PATIENT GOALS I want to be more independent  OBJECTIVE:   Today's treatment:   Dynamic activities in standing without UE support, dynamic functional reaching with trunk rotation to place and remove graded clothespins from vertical antennae, and placing and removing large to small pegs in pegboard, close supervision, min v.c.   Standing in kitchen pt retrieved a mug, filled with water and transported to microwave using rollator to carry , min-mod v.c for safety and rollator use,  increased time required.  Discussion with pt that she should increase her I with all activities IADLS/ADLS prior to considering return to driving. Pt's dtr will be in town 05/18/22. Currently pt has hired caregivers all day up until bed time.     PATIENT EDUCATION: Education details: Recommendation that pt does not consider driving until she is able to complete ADLs/IADLS mod I without caregiver's. Pt was previously provided information for driving evaluations. Walker safety for [employer] Person educated: Patient Education method: Consulting civil engineer, Media planner, Verbal cues, Education comprehension: verbalized understanding , needs reinforcement   HOME EXERCISE PROGRAM: Driving Evaluation Info Issued 03/16/22:  UE yellow theraband HEP; Functional Home Activity Program to incr IADL participation (with supervision)--see pt instructions    GOALS: Goals reviewed with patient? Yes  SHORT TERM GOALS: Target date: 03/08/22  Patient will complete a home activity program designed to decrease level of care provided by paid caregivers Baseline: Goal status: Met. Per pt report 04/14/22  2.  Patient will transfer into /  out of shower with distant supervision Baseline:  Goal status: Partially met.  04/14/22:  min A at home as grab bars are not installed yet, but pt performed at beach with distant supervision (zero entry)  3.  Patient will wash her back with AE without physical assistance Baseline:  Goal status: Partially met.  04/14/22:  has long handled sponge, but aide performs (pt performed without assist at the beach).     LONG TERM GOALS: Target date: 06/13/22  Patient will reduce at least 6 hours total of paid caregiver time Goal status: IN PROGRESS.  04/14/22:  has not reduced paid caregiver time  2.  Patient will demonstrate awareness of return to driving recommendations Goal status: Met.  Has been issued (reviewed 04/14/22)  3.  Patient will dress herself with modified  independence including right ankle brace Goal status: MET  04/14/22 Met per pt except brace (pt hasn't be wearing).  4.  Patient will demonstrate improved stand balance as evidenced by standing without UE support during functional activities.   Goal status: IN PROGRESS, not consistent 04/14/22   5.  New for renewal:  Pt will be independent with updated functional home activity program to decr level of care provided by hired caregivers.     Goal status:  New         ASSESSMENT:  CLINICAL IMPRESSION: Pt progressing towards goals. She has not been seen consistently due to scheduling. Additional visits added as a result. Pt limited by cognitive deficits, decr balance, and decr endurance.    PERFORMANCE DEFICITS in functional skills including coordination, dexterity, proprioception, sensation, tone, ROM, strength, flexibility, FMC, GMC, and UE functional use,   IMPAIRMENTS are limiting patient from ADLs and IADLs.   COMORBIDITIES may have co-morbidities  that affects occupational performance. Patient will benefit from skilled OT to address above impairments and improve overall function.  MODIFICATION OR ASSISTANCE TO COMPLETE EVALUATION: Min-Moderate modification of tasks or assist with assess necessary to complete an evaluation.  OT OCCUPATIONAL PROFILE AND HISTORY: Detailed assessment: Review of records and additional review of physical, cognitive, psychosocial history related to current functional performance.  CLINICAL DECISION MAKING: Moderate - several treatment options, min-mod task modification necessary  REHAB POTENTIAL: Good  EVALUATION COMPLEXITY: Moderate   PLAN: OT FREQUENCY: 1x/week  OT DURATION:  4 sessions over 8 weeks due to scheduling needs   PLANNED INTERVENTIONS: self care/ADL training, therapeutic exercise, therapeutic activity, neuromuscular re-education, balance training, functional mobility training, aquatic therapy, patient/family education,  visual/perceptual remediation/compensation, and DME and/or AE instructions  RECOMMENDED OTHER SERVICES: NA  CONSULTED AND AGREED WITH PLAN OF CARE: Patient  PLAN FOR NEXT SESSION:  simulate ADLS/ IADLS and work with pt on how she could safely reduce caregiver's, Pt's dtr will be in town week of 05/18/22. practice IADLs, continue standing balance without UE support  Dennis Bast, OTR/L 05/15/2022, 2:27 PM

## 2022-05-18 ENCOUNTER — Encounter: Payer: Self-pay | Admitting: Physical Medicine and Rehabilitation

## 2022-05-18 ENCOUNTER — Ambulatory Visit: Payer: Medicare Other | Admitting: Occupational Therapy

## 2022-05-18 ENCOUNTER — Encounter
Payer: Medicare Other | Attending: Physical Medicine and Rehabilitation | Admitting: Physical Medicine and Rehabilitation

## 2022-05-18 VITALS — BP 147/84 | HR 70 | Ht 62.0 in | Wt 174.0 lb

## 2022-05-18 DIAGNOSIS — I639 Cerebral infarction, unspecified: Secondary | ICD-10-CM | POA: Diagnosis not present

## 2022-05-18 DIAGNOSIS — R4701 Aphasia: Secondary | ICD-10-CM | POA: Diagnosis not present

## 2022-05-18 DIAGNOSIS — M6281 Muscle weakness (generalized): Secondary | ICD-10-CM | POA: Diagnosis not present

## 2022-05-18 DIAGNOSIS — I63512 Cerebral infarction due to unspecified occlusion or stenosis of left middle cerebral artery: Secondary | ICD-10-CM | POA: Diagnosis not present

## 2022-05-18 DIAGNOSIS — R269 Unspecified abnormalities of gait and mobility: Secondary | ICD-10-CM | POA: Diagnosis not present

## 2022-05-18 NOTE — Patient Instructions (Signed)
Pt is an 81 yr old female with hx of L MCA stroke in 3/23, HTN, CKD3a; hx of lung CA stage I; Asthma; anxiety with depression, hyperactive bladder; Glaucoma. Here for hospital f/u for L MCA stroke.  Hx of L TKR   Rollator wasn't used in the 2 times fell. So needs to use rollator!!  2.  Educated that most common cause of confusion can be UTI- in post stroke patient.    3.   Needs to show you can take your meds-  Suggest Alexa AND visual memories.   4. Stop AM of gabapentin- so just 100 mg nightly to keep for sleep.   5.  Can continue Singulair and trazodone 50 mg nightly   6. Do home exercise program.   7. Cannot drive still.  Need Speech therapy AND OT to both agree you are ready to drive. If they think will be a few months- then will send to driving evaluation. If not at all, need to make plans.   8.  F/U in 3 months-

## 2022-05-18 NOTE — Progress Notes (Signed)
Subjective:    Patient ID: [name], female    DOB: 12/27/1940, 81 y.o.   MRN: [id number]  HPI Pt is an 81 yr old female with hx of L MCA stroke in 3/23, HTN, CKD3a; hx of lung CA stage I; Asthma; anxiety with depression, hyperactive bladder; Glaucoma. Here for  f/u for L MCA stroke.  Hx of L TKR  Still doing therapies. Finished PT last week.  Done with PT, but continuing OT/SLP  1-2x/week for SLP and OT- 2 more OT appts.  Tried to schedule one today, but has SLP Wednesday.    Walking with Rollator- had upgraded at home without a RW, but then went back to walking with a RW at home- but she says she's NOT using it at home.   Still has caretakers  12 hours/day- pt "tired of them" and financially difficult.   Doing 1 day/on weekend.  Forgot meds 2 nights last week when didn't have caregivers-  Daughter gets call when medical alert gets low battery- hasn't gotten a call for a few weeks. Daughter put check lists on mirror- but still forgot meds- also forgot last night to take meds.   Since last here, 1 time spent night on floor- scooted and laid her head on couch.  Also fell 1 other time when daughter was there-  on floor in kitchen.   Day last week skipped therapy, very tired and didn't feel well- upset stomach from steak night before- seemed a little off.  Also snapped at caregiver at that time.    Has been to kidney doctor- has been checking Urine in last month pt thinks- .   Didn't do HEP last week- per pt- after d/c'd from PT.      Pain Inventory Average Pain 0 Pain Right Now 0 My pain is  no pain  LOCATION OF PAIN  no pain  BOWEL Number of stools per week: 5x Oral laxative use No  Type of laxative . Enema or suppository use No  History of colostomy No  Incontinent No   BLADDER Pads In and out cath, frequency . Able to self cath  . Bladder incontinence No  Frequent urination No  Leakage with coughing No  Difficulty starting stream No  Incomplete  bladder emptying No    Mobility walk with assistance use a walker do you drive?  no  Function retired  Neuro/Psych confusion  Prior Studies Any changes since last visit?  no  Physicians involved in your care Any changes since last visit?  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  Tobacco Use   Smoking status: Former    Packs/day: 0.75    Years: 20.00    Total pack years: 15.00    Types: Cigarettes    Quit date: 1980    Years since quitting: 43.8   Smokeless tobacco: Never  Vaping Use   Vaping Use: Never used  Substance and Sexual Activity   Alcohol use: Yes    Alcohol/week: 1.0 standard drink of alcohol    Types: 1 Glasses of wine per week    Comment: occasional   Drug use: No   Sexual activity: Not on file  Other Topics Concern   Not on file  Social History Narrative   ** Merged History Encounter **       Social Determinants of Health   Financial Resource Strain: Not on file  Food Insecurity: Not on file  Transportation Needs: Not on file  Physical Activity: Not on file  Stress: Not on file  Social Connections: Not on file   Past Surgical History:  Procedure Laterality Date   APPENDECTOMY     COLONOSCOPY     EYE SURGERY Left    cataract removal   ORIF WRIST FRACTURE Left 08/04/2013   Procedure: OPEN REDUCTION INTERNAL FIXATION (ORIF) LEFT DISTAL RADIUS WRIST FRACTURE;  Surgeon: Wynonia Sours, MD;  Location: [place];  Service: Orthopedics;  Laterality: Left;   PARTIAL HYSTERECTOMY     TOTAL KNEE ARTHROPLASTY Left 08/26/2018    Procedure: TOTAL KNEE ARTHROPLASTY;  Surgeon: Netta Cedars, MD;  Location: [facility];  Service: Orthopedics;  Laterality: Left;   VIDEO ASSISTED THORACOSCOPY (VATS)/WEDGE RESECTION Right 02/12/2014   Procedure: RIGHT VIDEO ASSISTED THORACOSCOPY RIGHT LOWER LOBE LUNG /WEDGE RESECTION,RIGHT THORACOTOMY WITH RIGHT LOWER LOBE LOBECTOMY & NODE DISSECTION;  Surgeon: Melrose Nakayama, MD;  Location: [place];  Service: Thoracic;  Laterality: Right;   VIDEO ASSISTED THORACOSCOPY (VATS)/WEDGE RESECTION Right 02/12/2014   VIDEO BRONCHOSCOPY N/A 02/12/2014   Procedure: VIDEO BRONCHOSCOPY;  Surgeon: Melrose Nakayama, MD;  Location: [place];  Service: Thoracic;  Laterality: N/A;   Past Medical History:  Diagnosis Date    Multiple pulmonary nodules, largest R mid lung 06/29/2013   Followed in Pulmonary clinic/ [facility]/ [facility] - see CXR 06/27/13  - CT chest 07/14/2013 > 1. No acute findings in the thorax to account for the patient's  symptoms.  2. However, there is a subsolid nodule in the   right lower lobe that has a ground-glass attenuation component  measuring 2.5 x 1.8 cm, and a small central solid component  measuring 4 mm. Initial follow-up by chest CT without    Adenocarcinoma of lung, stage 1 ([place])    Status post right lower lobectomy August 2015   Anxiety    Asthma    Atrophic vaginitis    Chronic cough 12/20/2018   Chronic iritis of left eye    Pupil stays dilated; nonreactive   Chronic kidney disease, stage 3b (HCC)    Colon polyp    Contact lens/glasses fitting    wears contacts or glasses   Cough variant asthma with component of uacs  05/31/2013   Followed in Pulmonary clinic/ [facility]/ [facility] in her 59's - Spirometry 02/23/13 wnl  - 07/03/2013 Sinus CT > Mild chronic sinus disease - No acute findings. Left-to-right nasal septal deviation of 3 mm. -med calendar 08/08/13 , redone 06/01/2016 and 02/22/2017  - Eos 4%   10/13/2013  > rec singulair daily  - FENO 05/12/2016  =   24 on  singulair  - Allergy profile 05/12/2016 >   IgE  47 pos   Depression    Depression with anxiety    Dysuria 08/03/2019   Environmental allergies    Essential hypertension 02/23/2017   Changed losartan to avapro 02/22/2017 due to cough    GERD (  gastroesophageal reflux disease)    Hypertension    Hypertriglyceridemia    Kidney stones    OA (osteoarthritis)    Osteopenia    PONV (postoperative nausea and vomiting)    S/P lobectomy of lung 02/12/2014   Status post total knee replacement, left 08/26/2018   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   BP (!) 147/84   Pulse 70   Ht 5\' 2"  (1.575 m)   Wt 174 lb (78.9 kg)   SpO2 97%   BMI 31.83 kg/m   Opioid Risk Score:   Fall Risk Score:  `1  Depression screen [facility] 2/9     01/28/2022   11:29 AM 10/22/2021    3:18 PM  Depression screen PHQ 2/9  Decreased Interest 1 0  Down, Depressed, Hopeless 1 0  PHQ - 2 Score 2 0  Altered sleeping  0  Tired, decreased energy  1  Change in appetite  0  Feeling bad or failure about yourself   0  Trouble concentrating  3  Moving slowly or fidgety/restless  2  Suicidal thoughts  0  PHQ-9 Score  6  Difficult doing work/chores  Very difficult     Review of Systems  Psychiatric/Behavioral:  Positive for confusion.   All other systems reviewed and are negative.     Objective:   Physical Exam  Awake, alert,  poor STM; accompanied by daughter, NAD Has rollator- walked in with it Significant word finding issues- at short sentence level, but a lot of word substitutions and a lot of pausing.  Stil very mild foot drag on L- 4 point turn to turn around      Assessment & Plan:   Pt is an 81 yr old female with hx of L MCA stroke in 3/23, HTN, CKD3a; hx of lung CA stage I; Asthma; anxiety with depression, hyperactive bladder; Glaucoma. Here for hospital f/u for L MCA stroke.  Hx of L TKR   Rollator wasn't used in the 2 times fell. So needs to use rollator!!  2.  Educated that most common cause of  confusion can be UTI- in post stroke patient.    3.   Needs to show you can take your meds-  Suggest Alexa AND visual memories.   4. Stop AM of gabapentin- so just 100 mg nightly to keep for sleep.   5.  Can continue Singulair and trazodone 50 mg nightly   6. Do home exercise program 5 days/week   7. Cannot drive still.  Need Speech therapy AND OT to both agree you are ready to drive. If they think will be a few months- then will send to driving evaluation. If not at all, need to make plans.   8.  F/U in 3 months-    I spent a total of  37  minutes on total care today- >50% coordination of care- due to discussion of driving, discussion of rollator; and reducing meds.

## 2022-05-19 ENCOUNTER — Ambulatory Visit: Payer: Medicare Other | Admitting: Physical Therapy

## 2022-05-19 ENCOUNTER — Encounter (INDEPENDENT_AMBULATORY_CARE_PROVIDER_SITE_OTHER): Payer: Medicare Other | Admitting: Ophthalmology

## 2022-05-19 DIAGNOSIS — H34831 Tributary (branch) retinal vein occlusion, right eye, with macular edema: Secondary | ICD-10-CM

## 2022-05-19 DIAGNOSIS — H353132 Nonexudative age-related macular degeneration, bilateral, intermediate dry stage: Secondary | ICD-10-CM | POA: Diagnosis not present

## 2022-05-19 DIAGNOSIS — I10 Essential (primary) hypertension: Secondary | ICD-10-CM | POA: Diagnosis not present

## 2022-05-19 DIAGNOSIS — H35033 Hypertensive retinopathy, bilateral: Secondary | ICD-10-CM

## 2022-05-19 DIAGNOSIS — H43813 Vitreous degeneration, bilateral: Secondary | ICD-10-CM | POA: Diagnosis not present

## 2022-05-20 ENCOUNTER — Ambulatory Visit: Payer: Medicare Other | Admitting: Speech Pathology

## 2022-05-20 DIAGNOSIS — R41841 Cognitive communication deficit: Secondary | ICD-10-CM

## 2022-05-20 DIAGNOSIS — Z9181 History of falling: Secondary | ICD-10-CM | POA: Diagnosis not present

## 2022-05-20 DIAGNOSIS — R4701 Aphasia: Secondary | ICD-10-CM

## 2022-05-20 DIAGNOSIS — M6281 Muscle weakness (generalized): Secondary | ICD-10-CM | POA: Diagnosis not present

## 2022-05-20 DIAGNOSIS — I69351 Hemiplegia and hemiparesis following cerebral infarction affecting right dominant side: Secondary | ICD-10-CM | POA: Diagnosis not present

## 2022-05-20 DIAGNOSIS — R2681 Unsteadiness on feet: Secondary | ICD-10-CM | POA: Diagnosis not present

## 2022-05-20 DIAGNOSIS — R482 Apraxia: Secondary | ICD-10-CM | POA: Diagnosis not present

## 2022-05-20 DIAGNOSIS — I63312 Cerebral infarction due to thrombosis of left middle cerebral artery: Secondary | ICD-10-CM | POA: Diagnosis not present

## 2022-05-20 DIAGNOSIS — I69190 Apraxia following nontraumatic intracerebral hemorrhage: Secondary | ICD-10-CM | POA: Diagnosis not present

## 2022-05-20 DIAGNOSIS — R278 Other lack of coordination: Secondary | ICD-10-CM | POA: Diagnosis not present

## 2022-05-20 DIAGNOSIS — R2689 Other abnormalities of gait and mobility: Secondary | ICD-10-CM | POA: Diagnosis not present

## 2022-05-20 NOTE — Patient Instructions (Signed)
Open the notepad  Create a new note (square with line in the corner) Press the microphone button (to left of space button) VERBALLY say items you want to list  Use the checklist option to make list, hit enter after each item you are listing  Try and come up with 6 items for each category:   Languages Historical figures Celebrities Holidays Items in a gift shop Things on a map Relatives Things in an office

## 2022-05-20 NOTE — Therapy (Signed)
OUTPATIENT SPEECH LANGUAGE PATHOLOGY TREATMENT (recert)    Patient Name: [name] MRN: [id number] DOB:Jun 05, 1941, 81 y.o., female Today's Date: 05/20/2022  PCP: Hulan Fess, MD REFERRING PROVIDER: Courtney Heys, MD   End of Session - 05/20/22 1407     Visit Number 18    Number of Visits 25   +7 visits for recert   Date for SLP Re-Evaluation 06/09/22    Progress Note Due on Visit 20    SLP Start Time 1407    SLP Stop Time  1445    SLP Time Calculation (min) 38 min    Activity Tolerance Patient tolerated treatment well                         Past Medical History:  Diagnosis Date    Multiple pulmonary nodules, largest R mid lung 06/29/2013   Followed in Pulmonary clinic/ [facility]/ [place] - see CXR 06/27/13  - CT chest 07/14/2013 > 1. No acute findings in the thorax to account for the patient's  symptoms.  2. However, there is a subsolid nodule in the   right lower lobe that has a ground-glass attenuation component  measuring 2.5 x 1.8 cm, and a small central solid component  measuring 4 mm. Initial follow-up by chest CT without    Adenocarcinoma of lung, stage 1 ([place])    Status post right lower lobectomy August 2015   Anxiety    Asthma    Atrophic vaginitis    Chronic cough 12/20/2018   Chronic iritis of left eye    Pupil stays dilated; nonreactive   Chronic kidney disease, stage 3b (HCC)    Colon polyp    Contact lens/glasses fitting    wears contacts or glasses   Cough variant asthma with component of uacs  05/31/2013   Followed in Pulmonary clinic/ [facility]/ [facility] in her 4's - Spirometry 02/23/13 wnl  - 07/03/2013 Sinus CT > Mild chronic sinus disease - No acute findings. Left-to-right nasal septal deviation of 3 mm. -med calendar 08/08/13 , redone 06/01/2016 and 02/22/2017  - Eos 4%   10/13/2013  > rec singulair daily  - FENO 05/12/2016  =   24 on singulair  - Allergy profile 05/12/2016 >   IgE  47 pos   Depression    Depression  with anxiety    Dysuria 08/03/2019   Environmental allergies    Essential hypertension 02/23/2017   Changed losartan to avapro 02/22/2017 due to cough    GERD (gastroesophageal reflux disease)    Hypertension    Hypertriglyceridemia    Kidney stones    OA (osteoarthritis)    Osteopenia    PONV (postoperative nausea and vomiting)    S/P lobectomy of lung 02/12/2014   Status post total knee replacement, left 08/26/2018   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   Past Surgical History:  Procedure Laterality Date   APPENDECTOMY     COLONOSCOPY     EYE SURGERY Left    cataract removal   ORIF WRIST FRACTURE Left 08/04/2013   Procedure: OPEN REDUCTION INTERNAL FIXATION (ORIF) LEFT DISTAL RADIUS WRIST FRACTURE;  Surgeon: Wynonia Sours, MD;  Location: [place];  Service: Orthopedics;  Laterality: Left;   PARTIAL HYSTERECTOMY     TOTAL KNEE ARTHROPLASTY Left 08/26/2018   Procedure: TOTAL KNEE ARTHROPLASTY;  Surgeon: Netta Cedars, MD;  Location: [facility];  Service: Orthopedics;  Laterality: Left;   VIDEO ASSISTED THORACOSCOPY (VATS)/WEDGE RESECTION  Right 02/12/2014   Procedure: RIGHT VIDEO ASSISTED THORACOSCOPY RIGHT LOWER LOBE LUNG /WEDGE RESECTION,RIGHT THORACOTOMY WITH RIGHT LOWER LOBE LOBECTOMY & NODE DISSECTION;  Surgeon: Melrose Nakayama, MD;  Location: [place];  Service: Thoracic;  Laterality: Right;   VIDEO ASSISTED THORACOSCOPY (VATS)/WEDGE RESECTION Right 02/12/2014   VIDEO BRONCHOSCOPY N/A 02/12/2014   Procedure: VIDEO BRONCHOSCOPY;  Surgeon: Melrose Nakayama, MD;  Location: [place];  Service: Thoracic;  Laterality: N/A;   Patient Active Problem List   Diagnosis Date Noted   Acute ischemic left MCA stroke ([place]) 01/28/2022   Aphasia as late effect of cerebrovascular accident (CVA) 01/28/2022   Right hemiparesis ([place]) 01/28/2022   Abnormal gait due to muscle weakness 01/28/2022   Insomnia 10/13/2021   Frequency of urination 10/13/2021   Vitamin D deficiency 10/13/2021    Chronic renal failure 10/13/2021   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[place]) 09/24/2021   Aphasia due to acute cerebrovascular accident (CVA) ([place]) 09/21/2021   Chronic kidney disease, stage 3b ([place]) 09/21/2021   Depression with anxiety 09/21/2021   Hypertension 09/21/2021   Asthma, chronic 09/21/2021   Fall at home, initial encounter 09/21/2021   Right ankle pain 09/21/2021   Dysuria 08/03/2019   Chronic cough 12/20/2018   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   Status post total knee replacement, left 08/26/2018   Essential hypertension 02/23/2017   Adenocarcinoma of lung, stage 1 ([place]) 05/08/2014   S/P lobectomy of lung 02/12/2014   Asthma 01/30/2014    Multiple pulmonary nodules, largest R mid lung 06/29/2013   Cough variant asthma with component of uacs  05/31/2013    ONSET DATE: March 2023, referral July 2023   REFERRING DIAG:  I69.320 (ICD-10-CM) - Aphasia as late effect of cerebrovascular accident (CVA)  I63.512 (ICD-10-CM) - Acute ischemic left MCA stroke (HCC)    THERAPY DIAG:  Apraxia  Cognitive communication deficit  Aphasia  Rationale for Evaluation and Treatment Rehabilitation  SUBJECTIVE:   SUBJECTIVE STATEMENT: Pt arrived with iPad, reports daughter helped with HEP d/t pt being unable to see because of eye injection  PAIN:  Are you having pain? No   OBJECTIVE:  TODAY'S TREATMENT:  05-20-22: SLP trained pt on use of dictation tool in notes to aid in generation of grocery list. SLP demonstrated use of microphone, return, delete, and checkbox tools. Following, pt able to use notes to generate list of x5 produce items given usual max-A to dictate vs type. Modified pt's cellphone for ease of use, added usual contacts to favorites. Pt able to demonstrate ability to find desired contact with min-A 5/5 trials. Attempted to call pt's daughter per her request with no answer. Updated HEP to practice using dictate tool on notes app.    05-12-22: Pt reports to having spoken to friend on the phone and friend commented on improved verbal communication. Pt did not bring cell phone or iPad, stating she did not know she had ST. Discussion on use of external aids to aid in management of schedule. Modified pt's schedule with key to ID speech vs physical vs occupational therapy. SLP led pt through moderately complex language task, given initial letter pt able to generate x5 items with occasional min-A, able to write 80% given rare min-A. Given word, pt able to accurately transcribe initial letter in 6/9 trials.   04-30-22: SLP facilitated discussion on pt's personal goals for ST, with education on importance that therapy addresses those goals to increase motivation and positive outcomes. Pt expresses strong desire to  work on her speech (apraxia and aphasia) as she finds her verbal expression difficulties very frustrating. Overall goal is increased independence and pt acknowledges need to work on increased independence in medication and schedule management. Additional needs ID are using phone or iPad as potential external cognitive aids or as device to A in written expression as pt has persisting agraphia. SLP collaborated with pt to generate new therapy goals and provided additional education on importance of HEP adherence. SLP counseled on self-advocacy in goal setting and HEP establishment to ensure pt understands how/why activities have been selected to help her meet her goals. Pt verbalizes understanding.   PATIENT EDUCATION: Education details: see above Person educated: Patient Education method: Customer service manager Education comprehension: verbalized understanding, returned demonstration, and needs further education   GOALS: Goals reviewed with patient? Yes  SHORT TERM GOALS: Target date: 03/03/22  Pt will verbalize 7 items in personally relevant categories with occasional min-A over 2 therapy sessions Baseline: 02-24-22;  02/26/22 Goal status: MET  2.  Pt will correct semantic paraphasia given usual mod-A in 70% of opportunities during structured task over 2 sessions.  Baseline: 02-24-22; 02-26-22 Goal status: MET  3.  Pt will write x5 item list with 80% accuracy, given usual mod-A over 2 sessions.  Baseline: 02-24-22; 02-26-22 Goal status: NOT MET  4.  Pt will successfully use trained anomia compensation in x3 opportunities across 2 therapy sessions with occasioanl min-A.  Baseline: 02-24-22; 02-26-22 Goal status: PARTIALLY MET   LONG TERM GOALS: Target date: 06/09/2022 (+6 weeks for recert, new goals added)  Pt will identify, and correct, 50% of semantic paraphasias during structured task with occasional min-A over 2 sessions.  Baseline: 04-30-22 Goal status: MET  2.  Pt will report improvement of 2 points via CPIB PROM by d/c Baseline: 15 Goal status: IN PROGRESS  3.  Pt will carryover dysnomia compensations to conversational speech, resulting in strategy use in 70% of opportunities over 15 minute conversation Baseline: 04-28-22 Goal status: partially met  4.  Pt will write x10 phrases, with mod-I, with 80% accuracy over 2 sessions.   Baseline:  Goal status: not met  5.  Pt will carryover compensations for increased indpendence in home based tasks, resulting in successful completion of pt selected tasks over 1 week period Baseline: 04-28-22  Goal status: met  5.  Pt will teach back memory compensations to aid in recall of information discussed in conversations with rare min-A Baseline:   Goal status: new at recert  6.  Pt will use external cognitive aid to A in increased independence in schedule and medication management with mod-I  Goal status: new at recert  7.  Pt will use cell phone or iPad to generate grocery list of x10 items with supervision A  Goal status: new at recert   8.  Pt will complete moderately complex language task with 80% accuracy with rare min-A  Goal status: new at  recert  9.  Pt will read 2 paragraph stimuli with employment of apraxia strategies, requiring no more than x3 A from SLP for correction  Goal status: new at recert  ASSESSMENT:  CLINICAL IMPRESSION: Patient is a 81 y.o. F presenting with mild-moderate expressive aphasia. Moderate improvement in discourse level speech, though intermittent vague language persists. Occasional anomia, with use of anomia strategies ~50% of opportunities. Decreased instances of paraphasias with increased awareness and ability to correct. Written expression impaired, yet improving at word level. SLP has initiated skilled interventions and strategies to  aid in abilites for desired functional tasks at home such as grocery list creation. SLP has initiated structured language tasks to aid in improved expressive language and enhanced communication confidence and efficacy. Additional therapy goals added per daughter concerns re: cognition impacting successful participation in home based activities. Initiated training of external cognitive assistive device as memory aid and caregiver education for appropriate cuing for routine changes to encourage independence in pt selected task of med administration.   OBJECTIVE IMPAIRMENTS include expressive language, aphasia, and apraxia. These impairments are limiting patient from effectively communicating at home and in community. Factors affecting potential to achieve goals and functional outcome are family/community support. Patient will benefit from skilled SLP services to address above impairments and improve overall function.  REHAB POTENTIAL: Good  PLAN: SLP FREQUENCY: 2x/week  SLP DURATION: 8 weeks  PLANNED INTERVENTIONS: Language facilitation, Cueing hierachy, Internal/external aids, Functional tasks, Multimodal communication approach, SLP instruction and feedback, Compensatory strategies, and Patient/family education    Su Monks, CCC-SLP 05/20/2022, 2:07 PM

## 2022-05-25 ENCOUNTER — Ambulatory Visit: Payer: Medicare Other | Admitting: Speech Pathology

## 2022-05-25 DIAGNOSIS — I63312 Cerebral infarction due to thrombosis of left middle cerebral artery: Secondary | ICD-10-CM | POA: Diagnosis not present

## 2022-05-25 DIAGNOSIS — R482 Apraxia: Secondary | ICD-10-CM

## 2022-05-25 DIAGNOSIS — R2681 Unsteadiness on feet: Secondary | ICD-10-CM | POA: Diagnosis not present

## 2022-05-25 DIAGNOSIS — M6281 Muscle weakness (generalized): Secondary | ICD-10-CM | POA: Diagnosis not present

## 2022-05-25 DIAGNOSIS — R2689 Other abnormalities of gait and mobility: Secondary | ICD-10-CM | POA: Diagnosis not present

## 2022-05-25 DIAGNOSIS — I69190 Apraxia following nontraumatic intracerebral hemorrhage: Secondary | ICD-10-CM | POA: Diagnosis not present

## 2022-05-25 DIAGNOSIS — R4701 Aphasia: Secondary | ICD-10-CM | POA: Diagnosis not present

## 2022-05-25 DIAGNOSIS — R278 Other lack of coordination: Secondary | ICD-10-CM | POA: Diagnosis not present

## 2022-05-25 DIAGNOSIS — Z9181 History of falling: Secondary | ICD-10-CM | POA: Diagnosis not present

## 2022-05-25 DIAGNOSIS — I69351 Hemiplegia and hemiparesis following cerebral infarction affecting right dominant side: Secondary | ICD-10-CM | POA: Diagnosis not present

## 2022-05-25 DIAGNOSIS — R41841 Cognitive communication deficit: Secondary | ICD-10-CM

## 2022-05-25 NOTE — Therapy (Signed)
OUTPATIENT SPEECH LANGUAGE PATHOLOGY TREATMENT (recert)    Patient Name: [name] MRN: [id number] DOB:09/30/1940, 81 y.o., female Today's Date: 05/25/2022  PCP: Hulan Fess, MD REFERRING PROVIDER: Courtney Heys, MD   End of Session - 05/25/22 1351     Visit Number 19    Number of Visits 25   +7 visits for recert   Date for SLP Re-Evaluation 06/09/22    Progress Note Due on Visit 20    SLP Start Time 1351    SLP Stop Time  1436    SLP Time Calculation (min) 45 min    Activity Tolerance Patient tolerated treatment well                          Past Medical History:  Diagnosis Date    Multiple pulmonary nodules, largest R mid lung 06/29/2013   Followed in Pulmonary clinic/ [facility]/ [place] - see CXR 06/27/13  - CT chest 07/14/2013 > 1. No acute findings in the thorax to account for the patient's  symptoms.  2. However, there is a subsolid nodule in the   right lower lobe that has a ground-glass attenuation component  measuring 2.5 x 1.8 cm, and a small central solid component  measuring 4 mm. Initial follow-up by chest CT without    Adenocarcinoma of lung, stage 1 ([place])    Status post right lower lobectomy August 2015   Anxiety    Asthma    Atrophic vaginitis    Chronic cough 12/20/2018   Chronic iritis of left eye    Pupil stays dilated; nonreactive   Chronic kidney disease, stage 3b (HCC)    Colon polyp    Contact lens/glasses fitting    wears contacts or glasses   Cough variant asthma with component of uacs  05/31/2013   Followed in Pulmonary clinic/ [facility]/ [facility] in her 2's - Spirometry 02/23/13 wnl  - 07/03/2013 Sinus CT > Mild chronic sinus disease - No acute findings. Left-to-right nasal septal deviation of 3 mm. -med calendar 08/08/13 , redone 06/01/2016 and 02/22/2017  - Eos 4%   10/13/2013  > rec singulair daily  - FENO 05/12/2016  =   24 on singulair  - Allergy profile 05/12/2016 >   IgE  47 pos   Depression    Depression  with anxiety    Dysuria 08/03/2019   Environmental allergies    Essential hypertension 02/23/2017   Changed losartan to avapro 02/22/2017 due to cough    GERD (gastroesophageal reflux disease)    Hypertension    Hypertriglyceridemia    Kidney stones    OA (osteoarthritis)    Osteopenia    PONV (postoperative nausea and vomiting)    S/P lobectomy of lung 02/12/2014   Status post total knee replacement, left 08/26/2018   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   Past Surgical History:  Procedure Laterality Date   APPENDECTOMY     COLONOSCOPY     EYE SURGERY Left    cataract removal   ORIF WRIST FRACTURE Left 08/04/2013   Procedure: OPEN REDUCTION INTERNAL FIXATION (ORIF) LEFT DISTAL RADIUS WRIST FRACTURE;  Surgeon: Wynonia Sours, MD;  Location: [place];  Service: Orthopedics;  Laterality: Left;   PARTIAL HYSTERECTOMY     TOTAL KNEE ARTHROPLASTY Left 08/26/2018   Procedure: TOTAL KNEE ARTHROPLASTY;  Surgeon: Netta Cedars, MD;  [facility];  Service: Orthopedics;  Laterality: Left;   VIDEO ASSISTED THORACOSCOPY (VATS)/WEDGE  RESECTION Right 02/12/2014   Procedure: RIGHT VIDEO ASSISTED THORACOSCOPY RIGHT LOWER LOBE LUNG /WEDGE RESECTION,RIGHT THORACOTOMY WITH RIGHT LOWER LOBE LOBECTOMY & NODE DISSECTION;  Surgeon: Melrose Nakayama, MD;  Location: [place];  Service: Thoracic;  Laterality: Right;   VIDEO ASSISTED THORACOSCOPY (VATS)/WEDGE RESECTION Right 02/12/2014   VIDEO BRONCHOSCOPY N/A 02/12/2014   Procedure: VIDEO BRONCHOSCOPY;  Surgeon: Melrose Nakayama, MD;  Location: [place];  Service: Thoracic;  Laterality: N/A;   Patient Active Problem List   Diagnosis Date Noted   Acute ischemic left MCA stroke ([place]) 01/28/2022   Aphasia as late effect of cerebrovascular accident (CVA) 01/28/2022   Right hemiparesis ([place]) 01/28/2022   Abnormal gait due to muscle weakness 01/28/2022   Insomnia 10/13/2021   Frequency of urination 10/13/2021   Vitamin D deficiency 10/13/2021    Chronic renal failure 10/13/2021   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[place]) 09/24/2021   Aphasia due to acute cerebrovascular accident (CVA) ([place]) 09/21/2021   Chronic kidney disease, stage 3b ([place]) 09/21/2021   Depression with anxiety 09/21/2021   Hypertension 09/21/2021   Asthma, chronic 09/21/2021   Fall at home, initial encounter 09/21/2021   Right ankle pain 09/21/2021   Dysuria 08/03/2019   Chronic cough 12/20/2018   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   Status post total knee replacement, left 08/26/2018   Essential hypertension 02/23/2017   Adenocarcinoma of lung, stage 1 ([place]) 05/08/2014   S/P lobectomy of lung 02/12/2014   Asthma 01/30/2014    Multiple pulmonary nodules, largest R mid lung 06/29/2013   Cough variant asthma with component of uacs  05/31/2013    ONSET DATE: March 2023, referral July 2023   REFERRING DIAG:  I69.320 (ICD-10-CM) - Aphasia as late effect of cerebrovascular accident (CVA)  I63.512 (ICD-10-CM) - Acute ischemic left MCA stroke (HCC)    THERAPY DIAG:  Apraxia  Aphasia  Cognitive communication deficit  Rationale for Evaluation and Treatment Rehabilitation  SUBJECTIVE:   SUBJECTIVE STATEMENT: Pt reports unable to complete HEP d/t her and her sitter not being able to operate iPad.   PAIN:  Are you having pain? No   OBJECTIVE:  TODAY'S TREATMENT:  05-25-22: Pt reports to wanting less of caregivers, SLP leads pt through task to ID areas in which they A her in completing her daily routine. Pt able to generate x6, following says "wow they do a lot," and acknowledges that she may not be able to let them go at this time.  Pt with difficulties navigating device, unable to exit app. SLP demonstrates, then pt able to successfully exit an app using errorless learning and spaced retrieval up for 15 minutes with supervision-A following initial 2 attempts. Re-education on using notes app to keep lists using dictation  feature. Usual max-A to use. SLP probes whether tool would be helpful for pt, she believes it will be with practice. Lists x6 items would use from grocery store and 4 languages using app. SLP updated HEP for ongoing practice.   05-20-22: SLP trained pt on use of dictation tool in notes to aid in generation of grocery list. SLP demonstrated use of microphone, return, delete, and checkbox tools. Following, pt able to use notes to generate list of x5 produce items given usual max-A to dictate vs type. Modified pt's cellphone for ease of use, added usual contacts to favorites. Pt able to demonstrate ability to find desired contact with min-A 5/5 trials. Attempted to call pt's daughter per her request with no answer. Updated  HEP to practice using dictate tool on notes app.   05-12-22: Pt reports to having spoken to friend on the phone and friend commented on improved verbal communication. Pt did not bring cell phone or iPad, stating she did not know she had ST. Discussion on use of external aids to aid in management of schedule. Modified pt's schedule with key to ID speech vs physical vs occupational therapy. SLP led pt through moderately complex language task, given initial letter pt able to generate x5 items with occasional min-A, able to write 80% given rare min-A. Given word, pt able to accurately transcribe initial letter in 6/9 trials.   04-30-22: SLP facilitated discussion on pt's personal goals for ST, with education on importance that therapy addresses those goals to increase motivation and positive outcomes. Pt expresses strong desire to work on her speech (apraxia and aphasia) as she finds her verbal expression difficulties very frustrating. Overall goal is increased independence and pt acknowledges need to work on increased independence in medication and schedule management. Additional needs ID are using phone or iPad as potential external cognitive aids or as device to A in written expression as pt has  persisting agraphia. SLP collaborated with pt to generate new therapy goals and provided additional education on importance of HEP adherence. SLP counseled on self-advocacy in goal setting and HEP establishment to ensure pt understands how/why activities have been selected to help her meet her goals. Pt verbalizes understanding.   PATIENT EDUCATION: Education details: see above Person educated: Patient Education method: Customer service manager Education comprehension: verbalized understanding, returned demonstration, and needs further education   GOALS: Goals reviewed with patient? Yes  SHORT TERM GOALS: Target date: 03/03/22  Pt will verbalize 7 items in personally relevant categories with occasional min-A over 2 therapy sessions Baseline: 02-24-22; 02/26/22 Goal status: MET  2.  Pt will correct semantic paraphasia given usual mod-A in 70% of opportunities during structured task over 2 sessions.  Baseline: 02-24-22; 02-26-22 Goal status: MET  3.  Pt will write x5 item list with 80% accuracy, given usual mod-A over 2 sessions.  Baseline: 02-24-22; 02-26-22 Goal status: NOT MET  4.  Pt will successfully use trained anomia compensation in x3 opportunities across 2 therapy sessions with occasioanl min-A.  Baseline: 02-24-22; 02-26-22 Goal status: PARTIALLY MET   LONG TERM GOALS: Target date: 06/09/2022 (+6 weeks for recert, new goals added)  Pt will identify, and correct, 50% of semantic paraphasias during structured task with occasional min-A over 2 sessions.  Baseline: 04-30-22 Goal status: MET  2.  Pt will report improvement of 2 points via CPIB PROM by d/c Baseline: 15 Goal status: IN PROGRESS  3.  Pt will carryover dysnomia compensations to conversational speech, resulting in strategy use in 70% of opportunities over 15 minute conversation Baseline: 04-28-22 Goal status: partially met  4.  Pt will write x10 phrases, with mod-I, with 80% accuracy over 2 sessions.    Baseline:  Goal status: not met  5.  Pt will carryover compensations for increased indpendence in home based tasks, resulting in successful completion of pt selected tasks over 1 week period Baseline: 04-28-22  Goal status: met  5.  Pt will teach back memory compensations to aid in recall of information discussed in conversations with rare min-A Baseline:   Goal status: new at recert  6.  Pt will use external cognitive aid to A in increased independence in schedule and medication management with mod-I  Goal status: new at recert  7.  Pt will use cell phone or iPad to generate grocery list of x10 items with supervision A  Goal status: new at recert   8.  Pt will complete moderately complex language task with 80% accuracy with rare min-A  Goal status: new at recert  9.  Pt will read 2 paragraph stimuli with employment of apraxia strategies, requiring no more than x3 A from SLP for correction  Goal status: new at recert  ASSESSMENT:  CLINICAL IMPRESSION: Patient is a 81 y.o. F presenting with mild-moderate expressive aphasia. Moderate improvement in discourse level speech, though intermittent vague language persists. Occasional anomia, with use of anomia strategies ~50% of opportunities. Decreased instances of paraphasias with increased awareness and ability to correct. Written expression impaired, yet improving at word level. SLP has initiated skilled interventions and strategies to aid in abilites for desired functional tasks at home such as grocery list creation. SLP has initiated structured language tasks to aid in improved expressive language and enhanced communication confidence and efficacy. Additional therapy goals added per daughter concerns re: cognition impacting successful participation in home based activities. Initiated training of external cognitive assistive device as memory aid and caregiver education for appropriate cuing for routine changes to encourage independence in pt  selected task of med administration.   OBJECTIVE IMPAIRMENTS include expressive language, aphasia, and apraxia. These impairments are limiting patient from effectively communicating at home and in community. Factors affecting potential to achieve goals and functional outcome are family/community support. Patient will benefit from skilled SLP services to address above impairments and improve overall function.  REHAB POTENTIAL: Good  PLAN: SLP FREQUENCY: 2x/week  SLP DURATION: 8 weeks  PLANNED INTERVENTIONS: Language facilitation, Cueing hierachy, Internal/external aids, Functional tasks, Multimodal communication approach, SLP instruction and feedback, Compensatory strategies, and Patient/family education    Su Monks, CCC-SLP 05/25/2022, 1:51 PM

## 2022-05-25 NOTE — Patient Instructions (Signed)
Exiting an app: swipe up from the bottom of the screen  Things my caregivers do for me Alternatives if caregivers don't come or come less:

## 2022-05-26 ENCOUNTER — Telehealth: Payer: Self-pay

## 2022-05-26 ENCOUNTER — Ambulatory Visit (HOSPITAL_COMMUNITY)
Admission: RE | Admit: 2022-05-26 | Discharge: 2022-05-26 | Disposition: A | Discharge status: Home / Self Care | Payer: Medicare Other | Source: Ambulatory Visit | Attending: Adult Health | Admitting: Adult Health

## 2022-05-26 DIAGNOSIS — I6521 Occlusion and stenosis of right carotid artery: Secondary | ICD-10-CM

## 2022-05-26 NOTE — Telephone Encounter (Signed)
-----   Message from Frann Rider, NP sent at 05/26/2022  3:13 PM EST ----- Unable to provide results via MyChart - please advise patient that recent showed mild narrowing of both carotid arteries. No changes to current treatment plan at this time.

## 2022-05-26 NOTE — Telephone Encounter (Signed)
I called patient to discuss. No answer, left a message asking her to call me back. If patient calls back please route to POD 3.

## 2022-05-27 ENCOUNTER — Encounter: Payer: Self-pay | Admitting: Occupational Therapy

## 2022-06-02 ENCOUNTER — Ambulatory Visit: Payer: Medicare Other | Admitting: Occupational Therapy

## 2022-06-02 ENCOUNTER — Ambulatory Visit: Payer: Medicare Other | Admitting: Speech Pathology

## 2022-06-02 ENCOUNTER — Encounter: Payer: Self-pay | Admitting: Occupational Therapy

## 2022-06-02 DIAGNOSIS — I63312 Cerebral infarction due to thrombosis of left middle cerebral artery: Secondary | ICD-10-CM | POA: Diagnosis not present

## 2022-06-02 DIAGNOSIS — R4701 Aphasia: Secondary | ICD-10-CM | POA: Diagnosis not present

## 2022-06-02 DIAGNOSIS — R41841 Cognitive communication deficit: Secondary | ICD-10-CM

## 2022-06-02 DIAGNOSIS — R2681 Unsteadiness on feet: Secondary | ICD-10-CM

## 2022-06-02 DIAGNOSIS — R2689 Other abnormalities of gait and mobility: Secondary | ICD-10-CM

## 2022-06-02 DIAGNOSIS — R482 Apraxia: Secondary | ICD-10-CM

## 2022-06-02 DIAGNOSIS — M6281 Muscle weakness (generalized): Secondary | ICD-10-CM

## 2022-06-02 DIAGNOSIS — I69190 Apraxia following nontraumatic intracerebral hemorrhage: Secondary | ICD-10-CM | POA: Diagnosis not present

## 2022-06-02 DIAGNOSIS — I69351 Hemiplegia and hemiparesis following cerebral infarction affecting right dominant side: Secondary | ICD-10-CM

## 2022-06-02 DIAGNOSIS — Z9181 History of falling: Secondary | ICD-10-CM | POA: Diagnosis not present

## 2022-06-02 DIAGNOSIS — R278 Other lack of coordination: Secondary | ICD-10-CM

## 2022-06-02 NOTE — Therapy (Signed)
OUTPATIENT OCCUPATIONAL THERAPY NEURO DISCHARGE  Patient Name: [name] MRN: [id number] DOB:1940-08-17, 81 y.o., female Today's Date: 06/02/2022  OCCUPATIONAL THERAPY DISCHARGE SUMMARY  Visits from Start of Care: 9  Current functional level related to goals / functional outcomes: Pt has met 1/3 short term goals and 2/5 long term goals.    Remaining deficits: Requires assistance with ADLs and IADLs due to endurance and safety impairments.    Education / Equipment: Continue with HEP and adaptive strategies.   Patient agrees to discharge. Patient goals were partially met. Patient is being discharged due to  pt's progress appears to have plateaued. She will need to discuss potential next steps with respect to reducing caregiver hours with her daughter and attempt functional activities with supervision of caregivers due to safety concerns before she can attempt to progress further towards goals.Marland Kitchen   PCP: NA REFERRING PROVIDER: Courtney Heys, MD    OT End of Session - 06/02/22 1449     Visit Number 9    Number of Visits 10   6+4=10   Date for OT Re-Evaluation 06/13/22    Authorization Type [employer]    Authorization - Number of Visits 9    Progress Note Due on Visit 10    OT Start Time 1449    OT Stop Time 1528    OT Time Calculation (min) 39 min    Activity Tolerance Patient tolerated treatment well    Behavior During Therapy WFL for tasks assessed/performed                  Past Medical History:  Diagnosis Date    Multiple pulmonary nodules, largest R mid lung 06/29/2013   Followed in Pulmonary clinic/ [facility]/ [facility] - see CXR 06/27/13  - CT chest 07/14/2013 > 1. No acute findings in the thorax to account for the patient's  symptoms.  2. However, there is a subsolid nodule in the   right lower lobe that has a ground-glass attenuation component  measuring 2.5 x 1.8 cm, and a small central solid component  measuring 4 mm. Initial follow-up by chest  CT without    Adenocarcinoma of lung, stage 1 ([place])    Status post right lower lobectomy August 2015   Anxiety    Asthma    Atrophic vaginitis    Chronic cough 12/20/2018   Chronic iritis of left eye    Pupil stays dilated; nonreactive   Chronic kidney disease, stage 3b (HCC)    Colon polyp    Contact lens/glasses fitting    wears contacts or glasses   Cough variant asthma with component of uacs  05/31/2013   Followed in Pulmonary clinic/ [facility]/ [facility] in her 7's - Spirometry 02/23/13 wnl  - 07/03/2013 Sinus CT > Mild chronic sinus disease - No acute findings. Left-to-right nasal septal deviation of 3 mm. -med calendar 08/08/13 , redone 06/01/2016 and 02/22/2017  - Eos 4%   10/13/2013  > rec singulair daily  - FENO 05/12/2016  =   24 on singulair  - Allergy profile 05/12/2016 >   IgE  47 pos   Depression    Depression with anxiety    Dysuria 08/03/2019   Environmental allergies    Essential hypertension 02/23/2017   Changed losartan to avapro 02/22/2017 due to cough    GERD (gastroesophageal reflux disease)    Hypertension    Hypertriglyceridemia    Kidney stones    OA (osteoarthritis)    Osteopenia  PONV (postoperative nausea and vomiting)    S/P lobectomy of lung 02/12/2014   Status post total knee replacement, left 08/26/2018   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   Past Surgical History:  Procedure Laterality Date   APPENDECTOMY     COLONOSCOPY     EYE SURGERY Left    cataract removal   ORIF WRIST FRACTURE Left 08/04/2013   Procedure: OPEN REDUCTION INTERNAL FIXATION (ORIF) LEFT DISTAL RADIUS WRIST FRACTURE;  Surgeon: Wynonia Sours, MD;  Location: [place];  Service: Orthopedics;  Laterality: Left;   PARTIAL HYSTERECTOMY     TOTAL KNEE ARTHROPLASTY Left 08/26/2018   Procedure: TOTAL KNEE ARTHROPLASTY;  Surgeon: Netta Cedars, MD;  Location: [facility];  Service: Orthopedics;  Laterality: Left;   VIDEO ASSISTED THORACOSCOPY (VATS)/WEDGE RESECTION  Right 02/12/2014   Procedure: RIGHT VIDEO ASSISTED THORACOSCOPY RIGHT LOWER LOBE LUNG /WEDGE RESECTION,RIGHT THORACOTOMY WITH RIGHT LOWER LOBE LOBECTOMY & NODE DISSECTION;  Surgeon: Melrose Nakayama, MD;  Location: [place];  Service: Thoracic;  Laterality: Right;   VIDEO ASSISTED THORACOSCOPY (VATS)/WEDGE RESECTION Right 02/12/2014   VIDEO BRONCHOSCOPY N/A 02/12/2014   Procedure: VIDEO BRONCHOSCOPY;  Surgeon: Melrose Nakayama, MD;  Location: [place];  Service: Thoracic;  Laterality: N/A;   Patient Active Problem List   Diagnosis Date Noted   Acute ischemic left MCA stroke ([place]) 01/28/2022   Aphasia as late effect of cerebrovascular accident (CVA) 01/28/2022   Right hemiparesis ([place]) 01/28/2022   Abnormal gait due to muscle weakness 01/28/2022   Insomnia 10/13/2021   Frequency of urination 10/13/2021   Vitamin D deficiency 10/13/2021   Chronic renal failure 10/13/2021   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[place]) 09/24/2021   Aphasia due to acute cerebrovascular accident (CVA) ([place]) 09/21/2021   Chronic kidney disease, stage 3b ([place]) 09/21/2021   Depression with anxiety 09/21/2021   Hypertension 09/21/2021   Asthma, chronic 09/21/2021   Fall at home, initial encounter 09/21/2021   Right ankle pain 09/21/2021   Dysuria 08/03/2019   Chronic cough 12/20/2018   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   Status post total knee replacement, left 08/26/2018   Essential hypertension 02/23/2017   Adenocarcinoma of lung, stage 1 ([place]) 05/08/2014   S/P lobectomy of lung 02/12/2014   Asthma 01/30/2014    Multiple pulmonary nodules, largest R mid lung 06/29/2013   Cough variant asthma with component of uacs  05/31/2013    ONSET DATE: 01/28/2022   REFERRING DIAG: G81.91 (ICD-10-CM) - Right hemiparesis (HCC) I63.512 (ICD-10-CM) - Acute ischemic left MCA stroke (HCC)   THERAPY DIAG:  Apraxia  Cognitive communication deficit  Aphasia  Unsteadiness on feet  Other  abnormalities of gait and mobility  Muscle weakness (generalized)  History of falling  Other lack of coordination  Hemiplegia and hemiparesis following cerebral infarction affecting right dominant side (HCC)  Apraxia following nontraumatic intracerebral hemorrhage  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 She really likes steak but does not like to cook much. She would typically do laundry in smaller loads but not bed linens. She utilized the Astronomer. She did not speak with her daughter while she was in town about reducing caregiver hours. She feels she can do more but allows the caregivers to do most of the ADLs and IADLs as they are paid to do so.    She  has not reduced caregiver assistance or had a talk with her daughter about reducing caregiver hours. She was able to make her lunch  Pt accompanied by: self  PERTINENT HISTORY:  hx of hypertension, thrombotic stroke involving left middle cerebral artery, asthma, lung cancer, coronary calcification seen on CT and CKD    PRECAUTIONS: Fall  WEIGHT BEARING RESTRICTIONS No  PAIN:  Are you having pain? NO  FALLS: Has patient fallen in last 6 months? Yes [place] last week.  Fell backward, could not get up without help  LIVING ENVIRONMENT: Lives with: lives alone with aide/caregiver Mon-Fri Lives in: House/apartment (one level home w/ 1 step to enter)   Has following equipment at home: Environmental consultant - 4 wheeled, Shower bench, and Grab bars  PLOF: Independent with basic ADLs  PATIENT GOALS I want to be more independent  OBJECTIVE:   Today's treatment:  - Self-care/home management completed for duration as noted below including:  Pt located, transported, and put back kitchen items as needed to eat breakfast using rollator. She required cues to  utilize rollator appropriately.  Pt sequenced tasks for laundry completion using rollator for transport of clothes.   Therapist reviewed goals with patient and updated patient progression.  No new functional limitations identified. Pt aware of recommendations at d/c  PATIENT EDUCATION: Education details:  Management consultant for kitchen tasks; d/c recommendations Person educated: Patient Education method: Explanation, Demonstration, Verbal cues, Education comprehension: verbalized understanding   HOME EXERCISE PROGRAM: Driving Evaluation Info Issued 03/16/22:  UE yellow theraband HEP; Functional Home Activity Program to incr IADL participation (with supervision)--see pt instructions    GOALS: Goals reviewed with patient? Yes  SHORT TERM GOALS: Target date: 03/08/22  Patient will complete a home activity program designed to decrease level of care provided by paid caregivers Baseline: Goal status: Met. Per pt report 04/14/22  2.  Patient will transfer into / out of shower with distant supervision Baseline:  Goal status: Partially met.  04/14/22:  min A at home as grab bars are not installed yet, but pt performed at beach with distant supervision (zero entry)  3.  Patient will wash her back with AE without physical assistance Baseline:  Goal status: Partially met.  04/14/22:  has long handled sponge, but aide performs (pt performed without assist at the beach).     LONG TERM GOALS: Target date: 06/13/22  Patient will reduce at least 6 hours total of paid caregiver time Goal status: IN PROGRESS.  04/14/22:  has not reduced paid caregiver time  2.  Patient will demonstrate awareness of return to driving recommendations Goal status: Met.  Has been issued (reviewed 04/14/22)  3.  Patient will dress herself with modified independence including right ankle brace Goal status: MET  04/14/22 Met per pt except brace (pt hasn't be wearing).  4.  Patient will demonstrate improved stand  balance as evidenced by standing without UE support during functional activities.   Goal status: IN PROGRESS, not consistent 04/14/22   5.  New for renewal:  Pt will be independent with updated functional home activity program to decr level of care provided by hired caregivers.     Goal status:  New         ASSESSMENT:  CLINICAL IMPRESSION: Pt does not demonstrate consistent effort to address remaining goals as needed outside of therapy clinic. Recommend pt discuss caregiver arrangements with daughter and trial completing 1 ADL or IADL with only supervision from paid caregiver to increase pt confidence and to have  assistance as needed should safety concerns arise. She would be appropriate for further skilled OT services should her caregiver needs change. No further need for skilled OT services at this time.   PERFORMANCE DEFICITS in functional skills including coordination, dexterity, proprioception, sensation, tone, ROM, strength, flexibility, FMC, GMC, and UE functional use,   IMPAIRMENTS are limiting patient from ADLs and IADLs.   COMORBIDITIES may have co-morbidities  that affects occupational performance. Patient will benefit from skilled OT to address above impairments and improve overall function.  MODIFICATION OR ASSISTANCE TO COMPLETE EVALUATION: Min-Moderate modification of tasks or assist with assess necessary to complete an evaluation.  OT OCCUPATIONAL PROFILE AND HISTORY: Detailed assessment: Review of records and additional review of physical, cognitive, psychosocial history related to current functional performance.  CLINICAL DECISION MAKING: Moderate - several treatment options, min-mod task modification necessary  REHAB POTENTIAL: Good  EVALUATION COMPLEXITY: Moderate   PLAN: OT FREQUENCY: 1x/week  OT DURATION:  4 sessions over 8 weeks due to scheduling needs   PLANNED INTERVENTIONS: self care/ADL training, therapeutic exercise, therapeutic activity,  neuromuscular re-education, balance training, functional mobility training, aquatic therapy, patient/family education, visual/perceptual remediation/compensation, and DME and/or AE instructions  RECOMMENDED OTHER SERVICES: NA  CONSULTED AND AGREED WITH PLAN OF CARE: Patient  PLAN FOR NEXT SESSION:  simulate ADLS/ IADLS and work with pt on how she could safely reduce caregiver's. Practice IADLs, continue standing balance without UE support.  Dennis Bast, OTR/L 06/02/2022, 5:40 PM

## 2022-06-02 NOTE — Therapy (Signed)
OUTPATIENT SPEECH LANGUAGE PATHOLOGY TREATMENT (recert)    Patient Name: [name] MRN: [id number] DOB:1940-09-10, 81 y.o., female Today's Date: 06/02/2022  PCP: Hulan Fess, MD REFERRING PROVIDER: Courtney Heys, MD   End of Session - 06/02/22 1401     Visit Number 20    Number of Visits 25   +7 visits for recert   Date for SLP Re-Evaluation 06/09/22    Progress Note Due on Visit 20    SLP Start Time 1401    SLP Stop Time  7408    SLP Time Calculation (min) 44 min    Activity Tolerance Patient tolerated treatment well                           Past Medical History:  Diagnosis Date    Multiple pulmonary nodules, largest R mid lung 06/29/2013   Followed in Pulmonary clinic/ [facility]/ [place] - see CXR 06/27/13  - CT chest 07/14/2013 > 1. No acute findings in the thorax to account for the patient's  symptoms.  2. However, there is a subsolid nodule in the   right lower lobe that has a ground-glass attenuation component  measuring 2.5 x 1.8 cm, and a small central solid component  measuring 4 mm. Initial follow-up by chest CT without    Adenocarcinoma of lung, stage 1 ([place])    Status post right lower lobectomy August 2015   Anxiety    Asthma    Atrophic vaginitis    Chronic cough 12/20/2018   Chronic iritis of left eye    Pupil stays dilated; nonreactive   Chronic kidney disease, stage 3b (HCC)    Colon polyp    Contact lens/glasses fitting    wears contacts or glasses   Cough variant asthma with component of uacs  05/31/2013   Followed in Pulmonary clinic/ [facility]/ [place] in her 76's - Spirometry 02/23/13 wnl  - 07/03/2013 Sinus CT > Mild chronic sinus disease - No acute findings. Left-to-right nasal septal deviation of 3 mm. -med calendar 08/08/13 , redone 06/01/2016 and 02/22/2017  - Eos 4%   10/13/2013  > rec singulair daily  - FENO 05/12/2016  =   24 on singulair  - Allergy profile 05/12/2016 >   IgE  47 pos   Depression     Depression with anxiety    Dysuria 08/03/2019   Environmental allergies    Essential hypertension 02/23/2017   Changed losartan to avapro 02/22/2017 due to cough    GERD (gastroesophageal reflux disease)    Hypertension    Hypertriglyceridemia    Kidney stones    OA (osteoarthritis)    Osteopenia    PONV (postoperative nausea and vomiting)    S/P lobectomy of lung 02/12/2014   Status post total knee replacement, left 08/26/2018   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   Past Surgical History:  Procedure Laterality Date   APPENDECTOMY     COLONOSCOPY     EYE SURGERY Left    cataract removal   ORIF WRIST FRACTURE Left 08/04/2013   Procedure: OPEN REDUCTION INTERNAL FIXATION (ORIF) LEFT DISTAL RADIUS WRIST FRACTURE;  Surgeon: Wynonia Sours, MD;  Location: [place];  Service: Orthopedics;  Laterality: Left;   PARTIAL HYSTERECTOMY     TOTAL KNEE ARTHROPLASTY Left 08/26/2018   Procedure: TOTAL KNEE ARTHROPLASTY;  Surgeon: Netta Cedars, MD;  Location: [facility];  Service: Orthopedics;  Laterality: Left;   VIDEO ASSISTED THORACOSCOPY (  VATS)/WEDGE RESECTION Right 02/12/2014   Procedure: RIGHT VIDEO ASSISTED THORACOSCOPY RIGHT LOWER LOBE LUNG /WEDGE RESECTION,RIGHT THORACOTOMY WITH RIGHT LOWER LOBE LOBECTOMY & NODE DISSECTION;  Surgeon: Melrose Nakayama, MD;  Location: [place];  Service: Thoracic;  Laterality: Right;   VIDEO ASSISTED THORACOSCOPY (VATS)/WEDGE RESECTION Right 02/12/2014   VIDEO BRONCHOSCOPY N/A 02/12/2014   Procedure: VIDEO BRONCHOSCOPY;  Surgeon: Melrose Nakayama, MD;  Location: [place];  Service: Thoracic;  Laterality: N/A;   Patient Active Problem List   Diagnosis Date Noted   Acute ischemic left MCA stroke ([place]) 01/28/2022   Aphasia as late effect of cerebrovascular accident (CVA) 01/28/2022   Right hemiparesis ([place]) 01/28/2022   Abnormal gait due to muscle weakness 01/28/2022   Insomnia 10/13/2021   Frequency of urination 10/13/2021   Vitamin D deficiency  10/13/2021   Chronic renal failure 10/13/2021   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[place]) 09/24/2021   Aphasia due to acute cerebrovascular accident (CVA) ([place]) 09/21/2021   Chronic kidney disease, stage 3b ([place]) 09/21/2021   Depression with anxiety 09/21/2021   Hypertension 09/21/2021   Asthma, chronic 09/21/2021   Fall at home, initial encounter 09/21/2021   Right ankle pain 09/21/2021   Dysuria 08/03/2019   Chronic cough 12/20/2018   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   Status post total knee replacement, left 08/26/2018   Essential hypertension 02/23/2017   Adenocarcinoma of lung, stage 1 ([place]) 05/08/2014   S/P lobectomy of lung 02/12/2014   Asthma 01/30/2014    Multiple pulmonary nodules, largest R mid lung 06/29/2013   Cough variant asthma with component of uacs  05/31/2013    ONSET DATE: March 2023, referral July 2023   REFERRING DIAG:  I69.320 (ICD-10-CM) - Aphasia as late effect of cerebrovascular accident (CVA)  I63.512 (ICD-10-CM) - Acute ischemic left MCA stroke (HCC)    THERAPY DIAG:  Apraxia  Cognitive communication deficit  Aphasia  Rationale for Evaluation and Treatment Rehabilitation  SUBJECTIVE:   SUBJECTIVE STATEMENT: Pt reports to having good holiday weekend, tells SLP she was able to communicate well with variety of communication partners  PAIN:  Are you having pain? No   OBJECTIVE:  TODAY'S TREATMENT:  06-02-22: Pt denies completion of HEP. Pt able to ID compensations which aided in successful communication over holiday weekend: having one on one conversations, placing self in not so busy place, use of anomia strategy (describing), and use of apraxia compensations (reduced rate). Pt reports to having paced herself to aid in maximizing participation in festivities. Pt able to name 9/19 family members of which she spent the holiday with, though unable to name pt was able to relay how each person was related. When  questioned, pt endorses these are names she should know save 1 with whom she was just introduced to this previous weekend. When attempting to relay detailas of watching football, pt with anomia for x1 team, tells SLP wrong color of [place] in response to questioning cues. SLP educated and led pt through use of visualization strategy to attempt to aid in recall. Further practice using visualization strategy to relay foods eaten on thanksgiving. Pt names x5, SLP aided pt in adding additional x2 meal items.   05-25-22: Pt reports to wanting less of caregivers, SLP leads pt through task to ID areas in which they A her in completing her daily routine. Pt able to generate x6, following says "wow they do a lot," and acknowledges that she may not be able to let them go at  this time.  Pt with difficulties navigating device, unable to exit app. SLP demonstrates, then pt able to successfully exit an app using errorless learning and spaced retrieval up for 15 minutes with supervision-A following initial 2 attempts. Re-education on using notes app to keep lists using dictation feature. Usual max-A to use. SLP probes whether tool would be helpful for pt, she believes it will be with practice. Lists x6 items would use from grocery store and 4 languages using app. SLP updated HEP for ongoing practice.    PATIENT EDUCATION: Education details: see above Person educated: Patient Education method: Customer service manager Education comprehension: verbalized understanding, returned demonstration, and needs further education   GOALS: Goals reviewed with patient? Yes  SHORT TERM GOALS: Target date: 03/03/22  Pt will verbalize 7 items in personally relevant categories with occasional min-A over 2 therapy sessions Baseline: 02-24-22; 02/26/22 Goal status: MET  2.  Pt will correct semantic paraphasia given usual mod-A in 70% of opportunities during structured task over 2 sessions.  Baseline: 02-24-22; 02-26-22 Goal  status: MET  3.  Pt will write x5 item list with 80% accuracy, given usual mod-A over 2 sessions.  Baseline: 02-24-22; 02-26-22 Goal status: NOT MET  4.  Pt will successfully use trained anomia compensation in x3 opportunities across 2 therapy sessions with occasioanl min-A.  Baseline: 02-24-22; 02-26-22 Goal status: PARTIALLY MET   LONG TERM GOALS: Target date: 06/09/2022 (+6 weeks for recert, new goals added)  Pt will identify, and correct, 50% of semantic paraphasias during structured task with occasional min-A over 2 sessions.  Baseline: 04-30-22 Goal status: MET  2.  Pt will report improvement of 2 points via CPIB PROM by d/c Baseline: 15 Goal status: IN PROGRESS  3.  Pt will carryover dysnomia compensations to conversational speech, resulting in strategy use in 70% of opportunities over 15 minute conversation Baseline: 04-28-22 Goal status: partially met  4.  Pt will write x10 phrases, with mod-I, with 80% accuracy over 2 sessions.   Baseline:  Goal status: not met  5.  Pt will carryover compensations for increased indpendence in home based tasks, resulting in successful completion of pt selected tasks over 1 week period Baseline: 04-28-22  Goal status: met  5.  Pt will teach back memory compensations to aid in recall of information discussed in conversations with rare min-A  Goal status: MET  6.  Pt will use external cognitive aid to A in increased independence in schedule and medication management with mod-I  Goal status: MET  7.  Pt will use cell phone or iPad to generate grocery list of x10 items with supervision A  Goal status: new at recert   8.  Pt will complete moderately complex language task with 80% accuracy with rare min-A  Goal status: new at recert  9.  Pt will read 2 paragraph stimuli with employment of apraxia strategies, requiring no more than x3 A from SLP for correction  Goal status: new at recert  ASSESSMENT:  CLINICAL IMPRESSION: Patient is a  81 y.o. F presenting with mild-moderate expressive aphasia. Moderate improvement in discourse level speech, though intermittent vague language persists. Occasional anomia, with use of anomia strategies ~50% of opportunities. Decreased instances of paraphasias with increased awareness and ability to correct. Written expression impaired, yet improving at word level. SLP has initiated skilled interventions and strategies to aid in abilites for desired functional tasks at home such as grocery list creation. SLP has initiated structured language tasks to aid in improved  expressive language and enhanced communication confidence and efficacy. Additional therapy goals added per daughter concerns re: cognition impacting successful participation in home based activities. Initiated training of external cognitive assistive device as memory aid and caregiver education for appropriate cuing for routine changes to encourage independence in pt selected task of med administration.   OBJECTIVE IMPAIRMENTS include expressive language, aphasia, and apraxia. These impairments are limiting patient from effectively communicating at home and in community. Factors affecting potential to achieve goals and functional outcome are family/community support. Patient will benefit from skilled SLP services to address above impairments and improve overall function.  REHAB POTENTIAL: Good  PLAN: SLP FREQUENCY: 2x/week  SLP DURATION: 8 weeks  PLANNED INTERVENTIONS: Language facilitation, Cueing hierachy, Internal/external aids, Functional tasks, Multimodal communication approach, SLP instruction and feedback, Compensatory strategies, and Patient/family education  Speech Therapy Progress Note  Dates of Reporting Period: 04-07-22 to 06-02-22  Objective Reports of Subjective Statement: Pt has been seen for 10 visits targeting aphasia and apraxia  Objective Measurements: Pt is demonstrating mild improvements in verbal expression. Able  to use iPad to write grocery list with usual mod-A, able to successfully complete functional, moderately complex language tasks  Goal Update: see above  Plan: d/c at next visit  Reason Skilled Services are Required: maximize expressive communication abilities, improve QoL and participation    Su Monks, [place] 06/02/2022, 2:01 PM

## 2022-06-03 NOTE — Telephone Encounter (Signed)
Contacted pt again, informed her recent showed mild narrowing of both carotid arteries. No changes to current treatment plan at this time.  Patient verbally understood and was appreciative.  Number provided to call back with any questions or concerns

## 2022-06-04 ENCOUNTER — Ambulatory Visit: Payer: Medicare Other | Admitting: Speech Pathology

## 2022-06-04 DIAGNOSIS — R4701 Aphasia: Secondary | ICD-10-CM

## 2022-06-04 DIAGNOSIS — R2689 Other abnormalities of gait and mobility: Secondary | ICD-10-CM | POA: Diagnosis not present

## 2022-06-04 DIAGNOSIS — I69190 Apraxia following nontraumatic intracerebral hemorrhage: Secondary | ICD-10-CM | POA: Diagnosis not present

## 2022-06-04 DIAGNOSIS — I63312 Cerebral infarction due to thrombosis of left middle cerebral artery: Secondary | ICD-10-CM | POA: Diagnosis not present

## 2022-06-04 DIAGNOSIS — R482 Apraxia: Secondary | ICD-10-CM | POA: Diagnosis not present

## 2022-06-04 DIAGNOSIS — R41841 Cognitive communication deficit: Secondary | ICD-10-CM

## 2022-06-04 DIAGNOSIS — R2681 Unsteadiness on feet: Secondary | ICD-10-CM | POA: Diagnosis not present

## 2022-06-04 DIAGNOSIS — Z9181 History of falling: Secondary | ICD-10-CM | POA: Diagnosis not present

## 2022-06-04 DIAGNOSIS — I69351 Hemiplegia and hemiparesis following cerebral infarction affecting right dominant side: Secondary | ICD-10-CM | POA: Diagnosis not present

## 2022-06-04 DIAGNOSIS — M6281 Muscle weakness (generalized): Secondary | ICD-10-CM | POA: Diagnosis not present

## 2022-06-04 DIAGNOSIS — R278 Other lack of coordination: Secondary | ICD-10-CM | POA: Diagnosis not present

## 2022-06-04 NOTE — Therapy (Signed)
OUTPATIENT SPEECH LANGUAGE PATHOLOGY TREATMENT (recert)    Patient Name: [name] MRN: [id number] DOB:09-30-1940, 81 y.o., female Today's Date: 06/04/2022  PCP: Emily Fess, MD REFERRING PROVIDER: Courtney Heys, MD   End of Session - 06/04/22 1402     Visit Number 21    Number of Visits 25   +7 visits for recert   Date for SLP Re-Evaluation 06/09/22    Progress Note Due on Visit 16    SLP Start Time 1402    SLP Stop Time  1445    SLP Time Calculation (min) 43 min    Activity Tolerance Patient tolerated treatment well                           Past Medical History:  Diagnosis Date    Multiple pulmonary nodules, largest R mid lung 06/29/2013   Followed in Pulmonary clinic/ [name]/ [name] - see CXR 06/27/13  - CT chest 07/14/2013 > 1. No acute findings in the thorax to account for the patient's  symptoms.  2. However, there is a subsolid nodule in the   right lower lobe that has a ground-glass attenuation component  measuring 2.5 x 1.8 cm, and a small central solid component  measuring 4 mm. Initial follow-up by chest CT without    Adenocarcinoma of lung, stage 1 ([name])    Status post right lower lobectomy August 2015   Anxiety    Asthma    Atrophic vaginitis    Chronic cough 12/20/2018   Chronic iritis of left eye    Pupil stays dilated; nonreactive   Chronic kidney disease, stage 3b ([name])    Colon polyp    Contact lens/glasses fitting    wears contacts or glasses   Cough variant asthma with component of uacs  05/31/2013   Followed in Pulmonary clinic/ [facility]/ [name] Onset in her 51's - Spirometry 02/23/13 wnl  - 07/03/2013 Sinus CT > Mild chronic sinus disease - No acute findings. Left-to-right nasal septal deviation of 3 mm. -med calendar 08/08/13 , redone 06/01/2016 and 02/22/2017  - Eos 4%   10/13/2013  > rec singulair daily  - FENO 05/12/2016  =   24 on singulair  - Allergy profile 05/12/2016 >   IgE  47 pos   Depression     Depression with anxiety    Dysuria 08/03/2019   Environmental allergies    Essential hypertension 02/23/2017   Changed losartan to avapro 02/22/2017 due to cough    GERD (gastroesophageal reflux disease)    Hypertension    Hypertriglyceridemia    Kidney stones    OA (osteoarthritis)    Osteopenia    PONV (postoperative nausea and vomiting)    S/P lobectomy of lung 02/12/2014   Status post total knee replacement, left 08/26/2018   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   Past Surgical History:  Procedure Laterality Date   APPENDECTOMY     COLONOSCOPY     EYE SURGERY Left    cataract removal   ORIF WRIST FRACTURE Left 08/04/2013   Procedure: OPEN REDUCTION INTERNAL FIXATION (ORIF) LEFT DISTAL RADIUS WRIST FRACTURE;  Surgeon: Wynonia Sours, MD;  Location: [facility];  Service: Orthopedics;  Laterality: Left;   PARTIAL HYSTERECTOMY     TOTAL KNEE ARTHROPLASTY Left 08/26/2018   Procedure: TOTAL KNEE ARTHROPLASTY;  Surgeon: Netta Cedars, MD;  Location: [facility];  Service: Orthopedics;  Laterality: Left;   VIDEO ASSISTED THORACOSCOPY (  VATS)/WEDGE RESECTION Right 02/12/2014   Procedure: RIGHT VIDEO ASSISTED THORACOSCOPY RIGHT LOWER LOBE LUNG /WEDGE RESECTION,RIGHT THORACOTOMY WITH RIGHT LOWER LOBE LOBECTOMY & NODE DISSECTION;  Surgeon: Melrose Nakayama, MD;  Location: [facility];  Service: Thoracic;  Laterality: Right;   VIDEO ASSISTED THORACOSCOPY (VATS)/WEDGE RESECTION Right 02/12/2014   VIDEO BRONCHOSCOPY N/A 02/12/2014   Procedure: VIDEO BRONCHOSCOPY;  Surgeon: Melrose Nakayama, MD;  Location: [facility];  Service: Thoracic;  Laterality: N/A;   Patient Active Problem List   Diagnosis Date Noted   Acute ischemic left MCA stroke ([name]) 01/28/2022   Aphasia as late effect of cerebrovascular accident (CVA) 01/28/2022   Right hemiparesis ([name]) 01/28/2022   Abnormal gait due to muscle weakness 01/28/2022   Insomnia 10/13/2021   Frequency of urination 10/13/2021   Vitamin D deficiency  10/13/2021   Chronic renal failure 10/13/2021   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[place]) 09/24/2021   Aphasia due to acute cerebrovascular accident (CVA) ([name]) 09/21/2021   Chronic kidney disease, stage 3b ([name]) 09/21/2021   Depression with anxiety 09/21/2021   Hypertension 09/21/2021   Asthma, chronic 09/21/2021   Fall at home, initial encounter 09/21/2021   Right ankle pain 09/21/2021   Dysuria 08/03/2019   Chronic cough 12/20/2018   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   Status post total knee replacement, left 08/26/2018   Essential hypertension 02/23/2017   Adenocarcinoma of lung, stage 1 ([place]) 05/08/2014   S/P lobectomy of lung 02/12/2014   Asthma 01/30/2014    Multiple pulmonary nodules, largest R mid lung 06/29/2013   Cough variant asthma with component of uacs  05/31/2013    ONSET DATE: March 2023, referral July 2023   REFERRING DIAG:  I69.320 (ICD-10-CM) - Aphasia as late effect of cerebrovascular accident (CVA)  I63.512 (ICD-10-CM) - Acute ischemic left MCA stroke ([name])    THERAPY DIAG:  Apraxia  Aphasia  Cognitive communication deficit  Rationale for Evaluation and Treatment Rehabilitation  SUBJECTIVE:   SUBJECTIVE STATEMENT: "I am just so tired"   PAIN:  Are you having pain? No   OBJECTIVE:  TODAY'S TREATMENT:  06-04-22: Completed Communication Participation Item Bank to assess pt's subjective perception of communication efficacy since initiation of ST. Pt scores 18, demonstrating improved confidence in ability to communication across variety of situations. Most difficult situations re those involving fast moving conversations or conversations within a small group. Discussed strategies of asking for more time, using visual gestures to indicate still talking, having one on one conversations. SLP and pt collaborated to generate extensive HEP list to aid in ongoing improvement. SLP modeled activities discussed with pt verbalizing  understanding. Pt tells SLP her language is not back to baseline but she is having increased success talking to variety of communication partners. Pt agreeable to d/c, tells SLP "I'll have to work at home now."   06-02-22: Pt denies completion of HEP. Pt able to ID compensations which aided in successful communication over holiday weekend: having one on one conversations, placing self in not so busy place, use of anomia strategy (describing), and use of apraxia compensations (reduced rate). Pt reports to having paced herself to aid in maximizing participation in festivities. Pt able to name 9/19 family members of which she spent the holiday with, though unable to name pt was able to relay how each person was related. When questioned, pt endorses these are names she should know save 1 with whom she was just introduced to this previous weekend. When attempting to relay detailas of  watching football, pt with anomia for x1 team, tells SLP wrong color of [place] in response to questioning cues. SLP educated and led pt through use of visualization strategy to attempt to aid in recall. Further practice using visualization strategy to relay foods eaten on thanksgiving. Pt names x5, SLP aided pt in adding additional x2 meal items.   05-25-22: Pt reports to wanting less of caregivers, SLP leads pt through task to ID areas in which they A her in completing her daily routine. Pt able to generate x6, following says "wow they do a lot," and acknowledges that she may not be able to let them go at this time.  Pt with difficulties navigating device, unable to exit app. SLP demonstrates, then pt able to successfully exit an app using errorless learning and spaced retrieval up for 15 minutes with supervision-A following initial 2 attempts. Re-education on using notes app to keep lists using dictation feature. Usual max-A to use. SLP probes whether tool would be helpful for pt, she believes it will be with practice. Lists x6 items  would use from grocery store and 4 languages using app. SLP updated HEP for ongoing practice.    PATIENT EDUCATION: Education details: see above Person educated: Patient Education method: Customer service manager Education comprehension: verbalized understanding, returned demonstration, and needs further education   GOALS: Goals reviewed with patient? Yes  SHORT TERM GOALS: Target date: 03/03/22  Pt will verbalize 7 items in personally relevant categories with occasional min-A over 2 therapy sessions Baseline: 02-24-22; 02/26/22 Goal status: MET  2.  Pt will correct semantic paraphasia given usual mod-A in 70% of opportunities during structured task over 2 sessions.  Baseline: 02-24-22; 02-26-22 Goal status: MET  3.  Pt will write x5 item list with 80% accuracy, given usual mod-A over 2 sessions.  Baseline: 02-24-22; 02-26-22 Goal status: NOT MET  4.  Pt will successfully use trained anomia compensation in x3 opportunities across 2 therapy sessions with occasioanl min-A.  Baseline: 02-24-22; 02-26-22 Goal status: PARTIALLY MET   LONG TERM GOALS: Target date: 06/09/2022 (+6 weeks for recert, new goals added)  Pt will identify, and correct, 50% of semantic paraphasias during structured task with occasional min-A over 2 sessions.  Baseline: 04-30-22 Goal status: MET  2.  Pt will report improvement of 2 points via CPIB PROM by d/c Baseline: 15; 18 Goal status: MET  3.  Pt will carryover dysnomia compensations to conversational speech, resulting in strategy use in 70% of opportunities over 15 minute conversation Baseline: 04-28-22 Goal status: partially met  4.  Pt will write x10 phrases, with mod-I, with 80% accuracy over 2 sessions.   Baseline:  Goal status: not met  5.  Pt will carryover compensations for increased indpendence in home based tasks, resulting in successful completion of pt selected tasks over 1 week period Baseline: 04-28-22  Goal status: met  5.  Pt will  teach back memory compensations to aid in recall of information discussed in conversations with rare min-A  Goal status: MET  6.  Pt will use external cognitive aid to A in increased independence in schedule and medication management with mod-I  Goal status: MET  7.  Pt will use cell phone or iPad to generate grocery list of x10 items with supervision A  Goal status: new at recert   8.  Pt will complete moderately complex language task with 80% accuracy with rare min-A  Goal status: met  9.  Pt will read 2 paragraph stimuli  with employment of apraxia strategies, requiring no more than x3 A from SLP for correction  Goal status: met  ASSESSMENT:  CLINICAL IMPRESSION: Skilled interventions targeting carryover of strategies and compensations for communication and memory. Provided extensive education and demonstration of HEP activities to facilitate ongoing improvement in language and thinking skills. Pt agreeable to d/c.   OBJECTIVE IMPAIRMENTS include expressive language, aphasia, and apraxia. These impairments are limiting patient from effectively communicating at home and in community. Factors affecting potential to achieve goals and functional outcome are family/community support. Patient will benefit from skilled SLP services to address above impairments and improve overall function.  REHAB POTENTIAL: Good  PLAN: SLP FREQUENCY: 2x/week  SLP DURATION: 8 weeks  PLANNED INTERVENTIONS: Language facilitation, Cueing hierachy, Internal/external aids, Functional tasks, Multimodal communication approach, SLP instruction and feedback, Compensatory strategies, and Patient/family education  Speech Therapy Progress Note  Dates of Reporting Period: 04-07-22 to 06-02-22  Objective Reports of Subjective Statement: Pt has been seen for 10 visits targeting aphasia and apraxia  Objective Measurements: Pt is demonstrating mild improvements in verbal expression. Able to use iPad to write grocery  list with usual mod-A, able to successfully complete functional, moderately complex language tasks  Goal Update: see above  Plan: d/c at next visit  Reason Skilled Services are Required: maximize expressive communication abilities, improve QoL and participation    Su Monks, [place] 06/04/2022, 2:02 PM

## 2022-06-04 NOTE — Patient Instructions (Signed)
When you can't think of the word: Describe, describe  What group does it belong to?  What do I use it for?  Where can I find it?  What does it LOOK like?  What other words go with it?  What is the 1st sound of the word?   Many Ways to Communicate  Describe it Write it Draw it Gesture it Use related words  [place] with Dr. Leo Rod at [facility] - email [email or web address]  TalkPath Therapy app by [place] on [facility] website National Aphasia Association - naa.aphasia.org Aphasia Recovery Connection - aphasiarecoveryconnection.org Tactus therapy apps Constant Therapy  Aphasia Therapy: How to Practice at [place] around the rooms in your home and name each object you see.  Go outside and name what you see. Open drawers, cabinets, closets and name the items you see. Look around and name items that begin with a certain letter. Look around and name items that are a certain color.  Pick a category and name as many items as you can in that category  PHRASE/SENTENCE LEVEL Describe what you are doing during your daily routine or chores.  Bathroom: Brushing teeth, washing face Dressing: Items of clothing you want to wear Kitchen: Name the item you are putting away from the sink or dishwasher. Say the steps for making food.  Laundry: Name the item you are folding/putting away.  Go for a walk and describe what people are doing  Look through pictures and describe who, what, where, when, and why for each picture.  Read a news article or watch a TV show, then have a conversation about it with your family, friend, or caregiver   READING Newspapers, magazines, recipes, letters, bills, text messages/emails  WRITING Try to keep using your iPad, remember I gave you instructions on how to make lists on notes.  You can also use dictation to compose a text message, email, set reminders.    MEMORY: write things  down, ask for reminders from caregivers/family, figure out the best routine for your meds and stick to it, use Alexia to give you reminders for things you don't want to forget  Remember, if you want more independence, you need to demonstrate you can DO the things you need to do. SHOW it, don't just say it.

## 2022-06-17 DIAGNOSIS — N1832 Chronic kidney disease, stage 3b: Secondary | ICD-10-CM | POA: Diagnosis not present

## 2022-06-17 DIAGNOSIS — E785 Hyperlipidemia, unspecified: Secondary | ICD-10-CM | POA: Diagnosis not present

## 2022-06-17 DIAGNOSIS — J4520 Mild intermittent asthma, uncomplicated: Secondary | ICD-10-CM | POA: Diagnosis not present

## 2022-06-17 DIAGNOSIS — I10 Essential (primary) hypertension: Secondary | ICD-10-CM | POA: Diagnosis not present

## 2022-06-17 DIAGNOSIS — K219 Gastro-esophageal reflux disease without esophagitis: Secondary | ICD-10-CM | POA: Diagnosis not present

## 2022-07-10 DIAGNOSIS — Z0000 Encounter for general adult medical examination without abnormal findings: Secondary | ICD-10-CM | POA: Diagnosis not present

## 2022-07-10 DIAGNOSIS — Z23 Encounter for immunization: Secondary | ICD-10-CM | POA: Diagnosis not present

## 2022-07-22 DIAGNOSIS — R112 Nausea with vomiting, unspecified: Secondary | ICD-10-CM | POA: Diagnosis not present

## 2022-07-22 DIAGNOSIS — R062 Wheezing: Secondary | ICD-10-CM | POA: Diagnosis not present

## 2022-07-22 DIAGNOSIS — B349 Viral infection, unspecified: Secondary | ICD-10-CM | POA: Diagnosis not present

## 2022-07-22 DIAGNOSIS — R051 Acute cough: Secondary | ICD-10-CM | POA: Diagnosis not present

## 2022-07-25 IMAGING — CT CT ANGIO HEAD-NECK (W OR W/O PERF)
2 of 10 series · 5 of 33 positions shown · IV contrast (OMNI 350)
Comparison: None.

CLINICAL DATA: Acute neurologic deficit

EXAM:
CT ANGIOGRAPHY HEAD AND NECK
TECHNIQUE: Multidetector CT imaging of the head and neck was performed using
the standard protocol during bolus administration of intravenous
contrast. Multiplanar CT image reconstructions and MIPs were
obtained to evaluate the vascular anatomy. Carotid stenosis
measurements (when applicable) are obtained utilizing NASCET
criteria, using the distal internal carotid diameter as the
denominator.

[Series 5: vpct neuro 5.0 · axial · 0.39mm/px · z∈[+92,+127]mm · 2 of 860 slices shown (1 of 2)]
[im 287/860  soft-tissue]
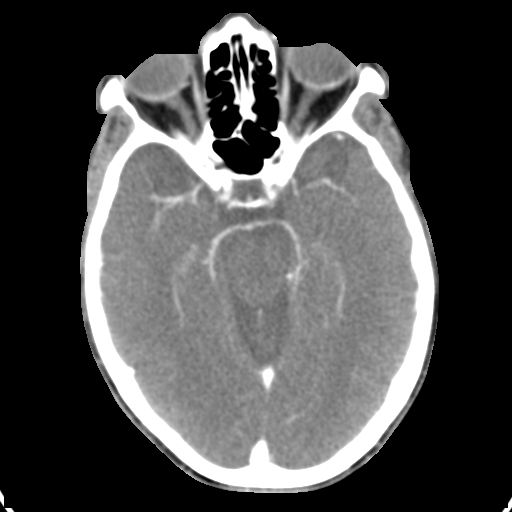
[im 573/860  soft-tissue]
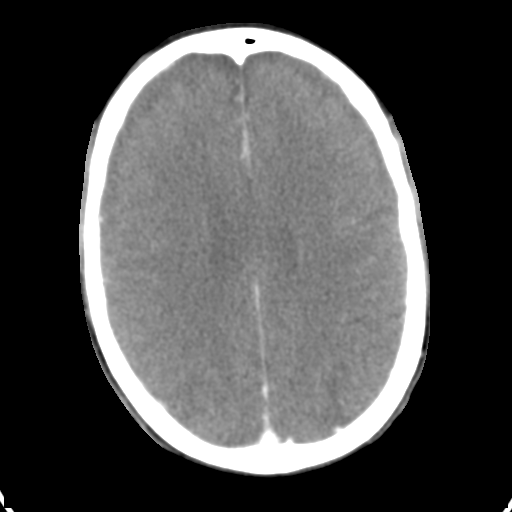

[Series 14: vpct neuro 5.0 · axial · 0.39mm/px · z∈[+82,+132]mm · 3 of 860 slices shown (2 of 2)]
[im 215/860  soft-tissue]
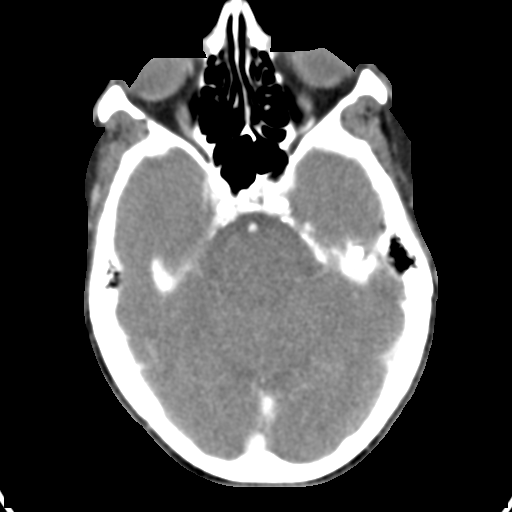
[im 430/860  bone]
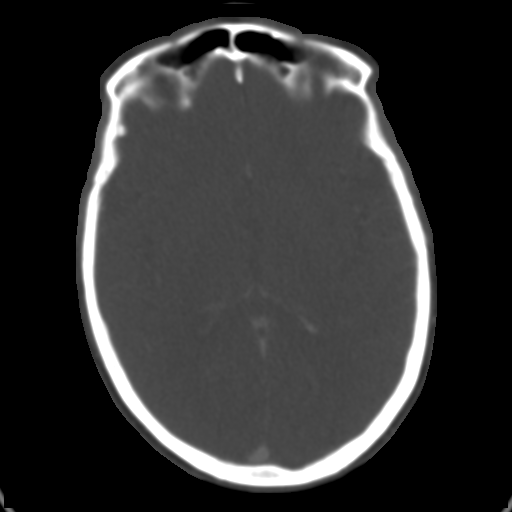
[im 645/860  soft-tissue]
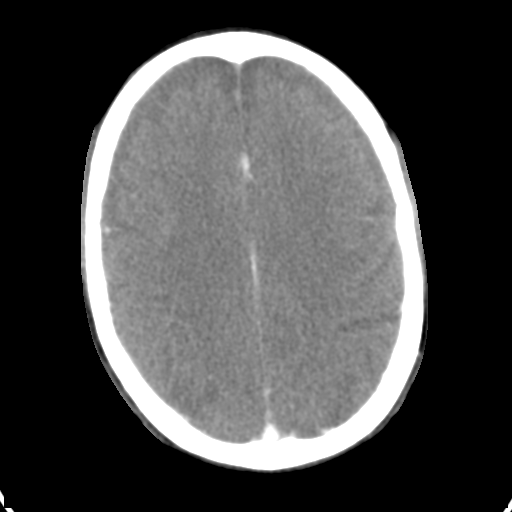

[5 of 33 positions shown; findings below may reference images not displayed]

RADIATION DOSE REDUCTION: This exam was performed according to the
departmental dose-optimization program which includes automated
exposure control, adjustment of the mA and/or kV according to
patient size and/or use of iterative reconstruction technique.

CONTRAST:  100mL OMNIPAQUE IOHEXOL 350 MG/ML SOLN
FINDINGS: CTA NECK FINDINGS

SKELETON: There is no bony spinal canal stenosis. No lytic or
blastic lesion.

OTHER NECK: Normal pharynx, larynx and major salivary glands. No
cervical lymphadenopathy. Unremarkable thyroid gland.

UPPER CHEST: No pneumothorax or pleural effusion. No nodules or
masses.

AORTIC ARCH:

There is no calcific atherosclerosis of the aortic arch. There is no
aneurysm, dissection or hemodynamically significant stenosis of the
visualized portion of the aorta. Conventional 3 vessel aortic
branching pattern. The visualized proximal subclavian arteries are
widely patent.

RIGHT CAROTID SYSTEM: No dissection, occlusion or aneurysm. There is
predominantly calcified atherosclerosis extending into the proximal
ICA, resulting in 70% stenosis.

LEFT CAROTID SYSTEM: No dissection, occlusion or aneurysm. Mild
atherosclerotic calcification at the carotid bifurcation without
hemodynamically significant stenosis.

VERTEBRAL ARTERIES: Left dominant configuration. Both origins are
clearly patent. There is no dissection, occlusion or flow-limiting
stenosis to the skull base (V1-V3 segments).

CTA HEAD FINDINGS

POSTERIOR CIRCULATION:

--Vertebral arteries: Normal V4 segments.

--Inferior cerebellar arteries: Normal.

--Basilar artery: Normal.

--Superior cerebellar arteries: Normal.

--Posterior cerebral arteries (PCA): Normal.

ANTERIOR CIRCULATION:

--Intracranial internal carotid arteries: Normal.

--Anterior cerebral arteries (ACA): Normal. Both A1 segments are
present. Patent anterior communicating artery (a-comm).

--Middle cerebral arteries (MCA): There is occlusion of a distal M2
branch on the left, at the posterior sylvian fissure (series 8,
image 86). No proximal occlusion.

VENOUS SINUSES: As permitted by contrast timing, patent.

ANATOMIC VARIANTS: None

Review of the MIP images confirms the above findings.
IMPRESSION: 1. Occlusion of a distal left MCA M2 branch, at the posterior
Sylvian fissure. This corresponds to the location of the previously
identified infarct.
2. 70% stenosis of the proximal right ICA secondary to predominantly
calcified atherosclerosis.
3. At the time of dictation there is a processing error with the
perfusion scan. If this can be corrected, I will provide an addendum
with the results.

## 2022-07-25 IMAGING — CT CT CERVICAL SPINE W/O CM
4 series · 15 of 33 positions shown, 18 images · non-contrast
Comparison: CT head and cervical spine dated April 17, 2015.

CLINICAL DATA: Fall.



[Series 4: c spine soft · axial · 0.36mm/px · z∈[-464,-434]mm · 2 of 89 slices shown]
[im 15/89  soft-tissue]
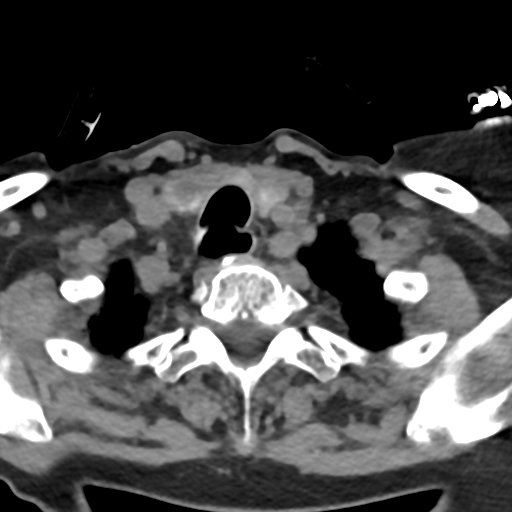
[im 30/89  soft-tissue]
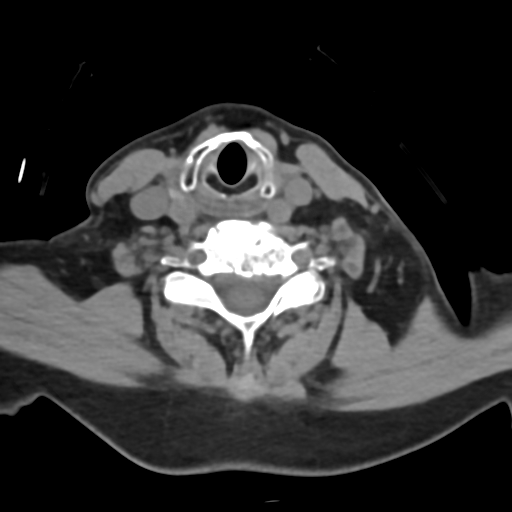

[Series 7: sag bone · sagittal · 0.32mm/px · 5 of 48 slices shown, 6 images]
[im 16/48  bone]
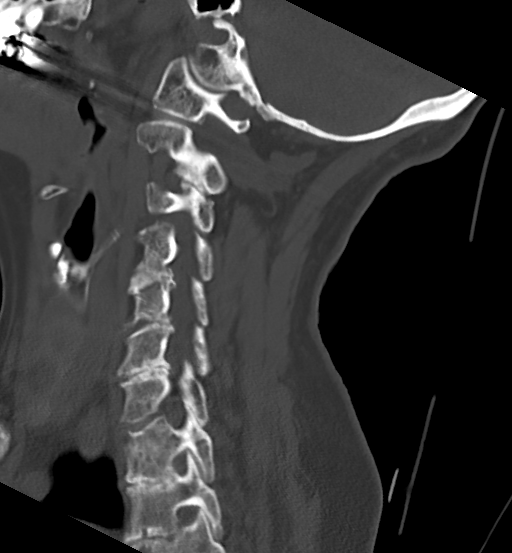
[im 20/48  bone]
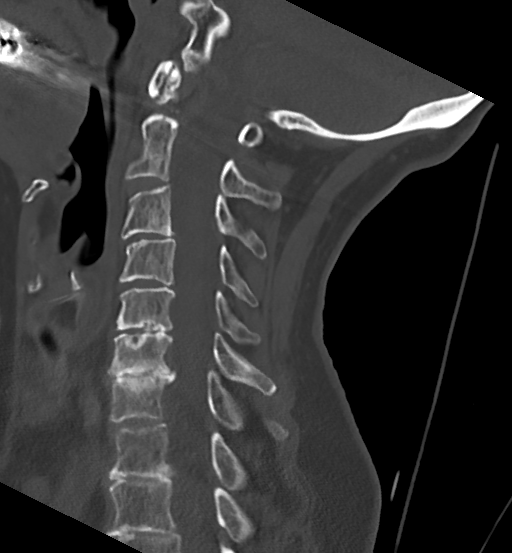
[im 24/48  soft-tissue]
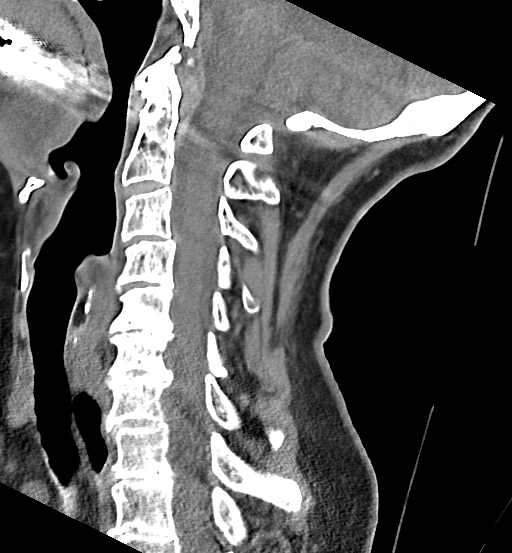
[im 24/48  bone]
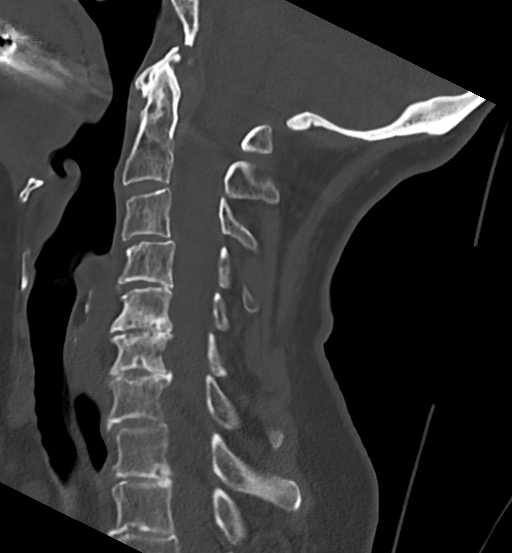
[im 28/48  bone]
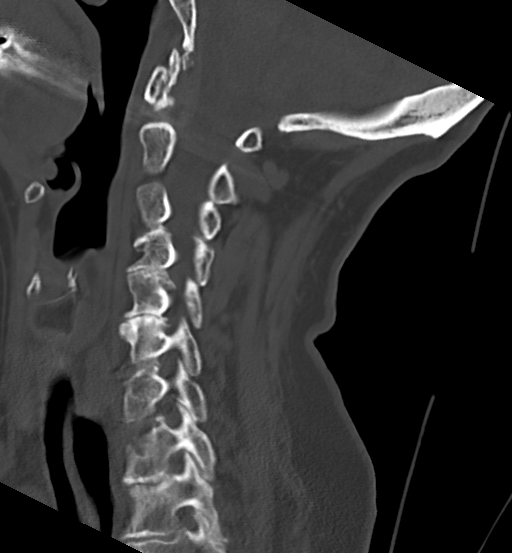
[im 32/48  bone]
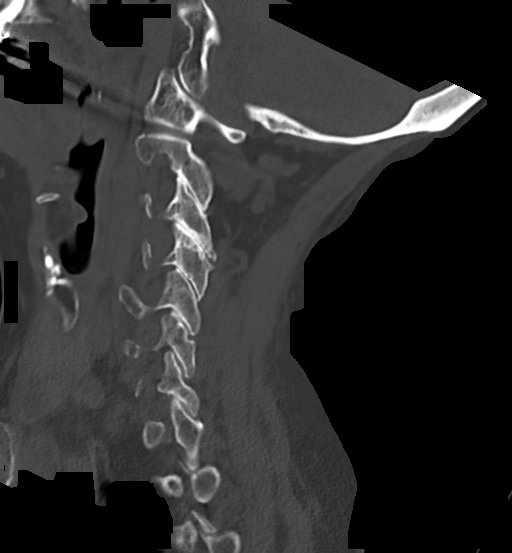

[Series 8: cor bone · coronal · 0.31mm/px · 3 of 67 slices shown]
[im 14/67  bone]
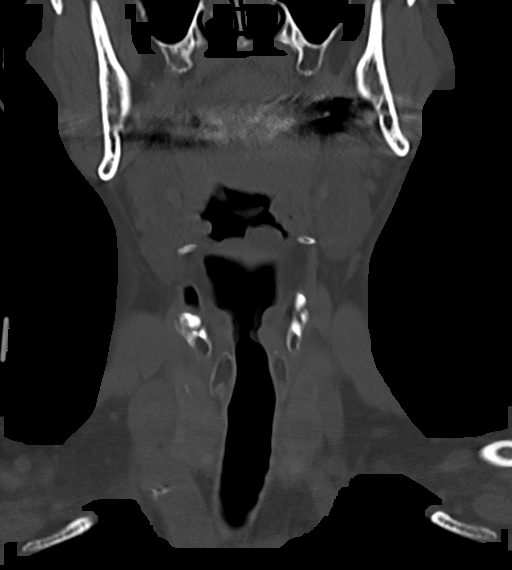
[im 27/67  bone]
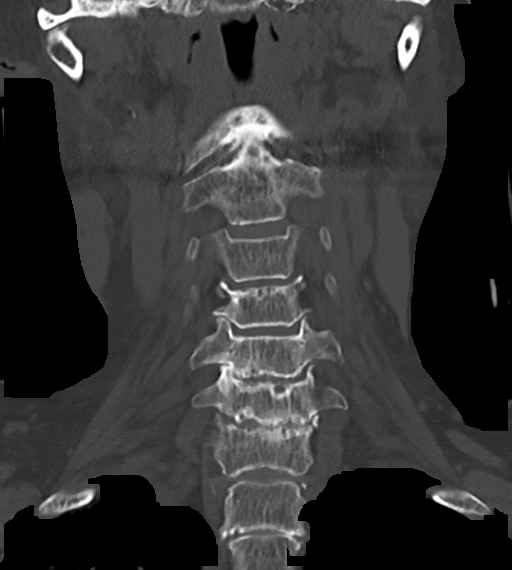
[im 40/67  bone]
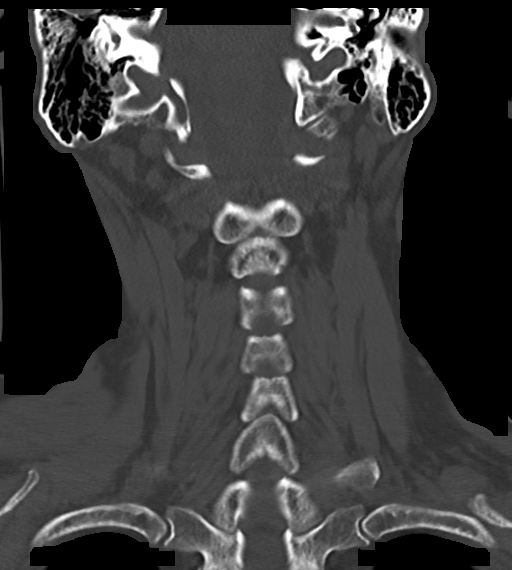

[Series 10: orthogonal axials · axial · 0.21mm/px · z∈[-475,-383]mm · 5 of 85 slices shown, 7 images]
[im 15/85  soft-tissue]
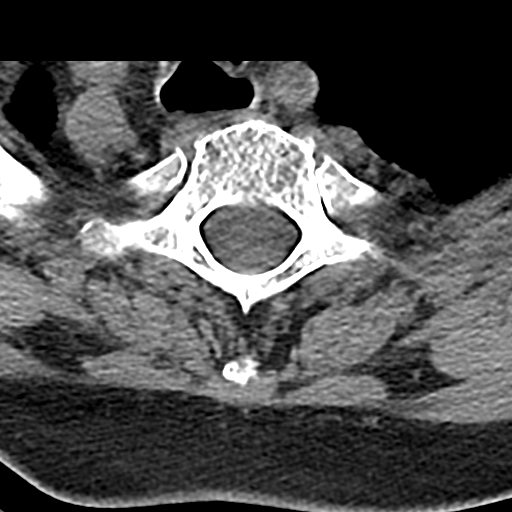
[im 15/85  bone]
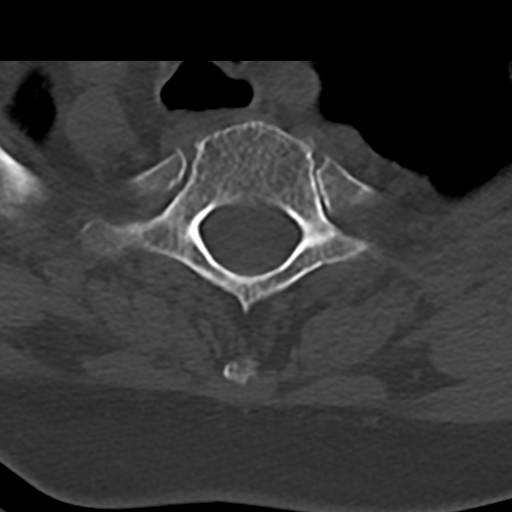
[im 29/85  bone]
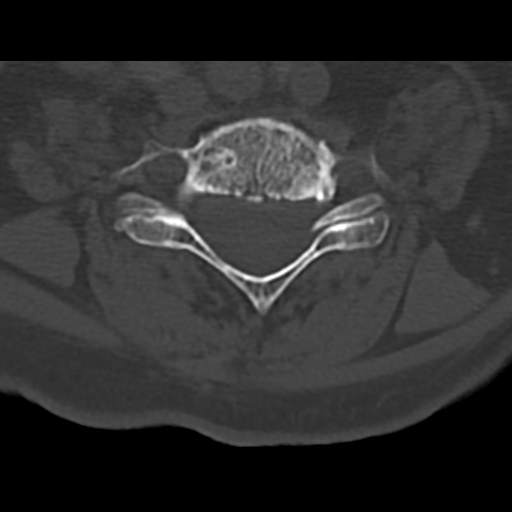
[im 43/85  bone]
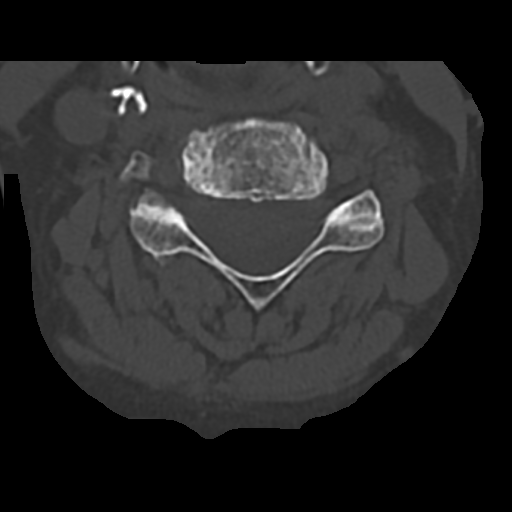
[im 57/85  bone]
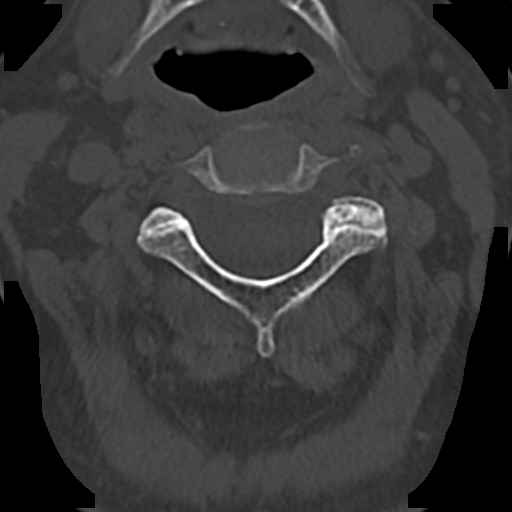
[im 71/85  soft-tissue]
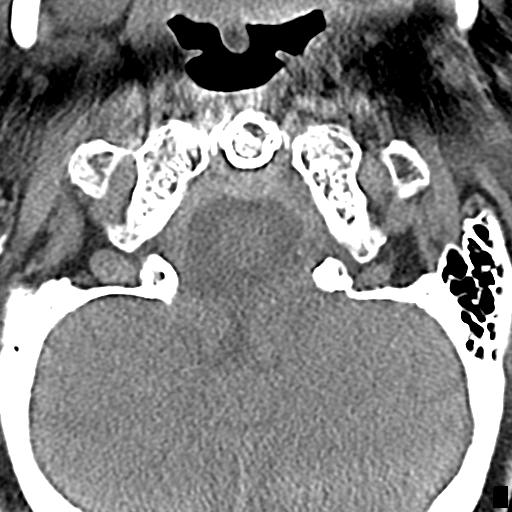
[im 71/85  bone]
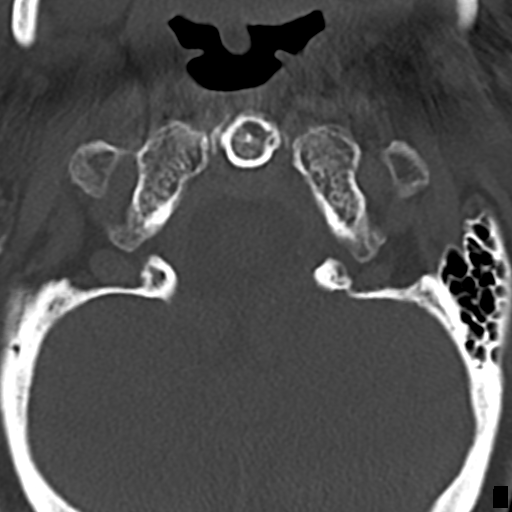

[15 of 33 positions shown; findings below may reference images not displayed]

FINDINGS: CT HEAD FINDINGS

Brain: No evidence of acute infarction, hemorrhage, hydrocephalus,
extra-axial collection or mass lesion/mass effect. Chronic lacunar
infarcts in the left caudate head, new since 9175. Stable mild
atrophy.

Vascular: Atherosclerotic vascular calcification of the carotid
siphons. No hyperdense vessel.

Skull: Normal. Negative for fracture or focal lesion.

Sinuses/Orbits: No acute finding.

Other: Small right frontoparietal scalp hematoma.

CT CERVICAL SPINE FINDINGS

Alignment: No traumatic malalignment. Straightening of the normal
cervical lordosis.

Skull base and vertebrae: No acute fracture. No primary bone lesion
or focal pathologic process.

Soft tissues and spinal canal: No prevertebral fluid or swelling. No
visible canal hematoma.

Disc levels: Multilevel disc height loss and uncovertebral
hypertrophy, severe at C5-C6 and C6-C7, progressed since 9175.
Progressive moderate facet arthropathy bilaterally at C3-C4.

Upper chest: No acute abnormality. Tiny pulmonary nodules and
ground-glass densities in both lung apices are unchanged dating back
to 5454, benign.

Other: None.
IMPRESSION: 1. No acute intracranial abnormality. Small right frontoparietal
scalp hematoma.
2. No acute cervical spine fracture or subluxation.
3. Chronic lacunar infarcts in the left caudate head, new since

## 2022-08-04 ENCOUNTER — Encounter (INDEPENDENT_AMBULATORY_CARE_PROVIDER_SITE_OTHER): Payer: Medicare Other | Admitting: Ophthalmology

## 2022-08-06 DIAGNOSIS — R062 Wheezing: Secondary | ICD-10-CM | POA: Diagnosis not present

## 2022-08-06 DIAGNOSIS — R053 Chronic cough: Secondary | ICD-10-CM | POA: Diagnosis not present

## 2022-08-06 DIAGNOSIS — W19XXXA Unspecified fall, initial encounter: Secondary | ICD-10-CM | POA: Diagnosis not present

## 2022-08-06 DIAGNOSIS — S96911A Strain of unspecified muscle and tendon at ankle and foot level, right foot, initial encounter: Secondary | ICD-10-CM | POA: Diagnosis not present

## 2022-08-12 ENCOUNTER — Encounter (INDEPENDENT_AMBULATORY_CARE_PROVIDER_SITE_OTHER): Payer: Medicare Other | Admitting: Ophthalmology

## 2022-08-12 DIAGNOSIS — H34831 Tributary (branch) retinal vein occlusion, right eye, with macular edema: Secondary | ICD-10-CM | POA: Diagnosis not present

## 2022-08-12 DIAGNOSIS — H353132 Nonexudative age-related macular degeneration, bilateral, intermediate dry stage: Secondary | ICD-10-CM | POA: Diagnosis not present

## 2022-08-12 DIAGNOSIS — I10 Essential (primary) hypertension: Secondary | ICD-10-CM

## 2022-08-12 DIAGNOSIS — H35033 Hypertensive retinopathy, bilateral: Secondary | ICD-10-CM | POA: Diagnosis not present

## 2022-08-12 DIAGNOSIS — H43813 Vitreous degeneration, bilateral: Secondary | ICD-10-CM

## 2022-08-17 ENCOUNTER — Encounter: Payer: Self-pay | Admitting: Physical Medicine and Rehabilitation

## 2022-08-17 ENCOUNTER — Encounter
Payer: Medicare Other | Attending: Physical Medicine and Rehabilitation | Admitting: Physical Medicine and Rehabilitation

## 2022-08-17 VITALS — BP 146/83 | HR 80 | Ht 62.0 in | Wt 173.4 lb

## 2022-08-17 DIAGNOSIS — R4701 Aphasia: Secondary | ICD-10-CM | POA: Insufficient documentation

## 2022-08-17 DIAGNOSIS — I63312 Cerebral infarction due to thrombosis of left middle cerebral artery: Secondary | ICD-10-CM | POA: Diagnosis not present

## 2022-08-17 DIAGNOSIS — R296 Repeated falls: Secondary | ICD-10-CM | POA: Insufficient documentation

## 2022-08-17 DIAGNOSIS — I639 Cerebral infarction, unspecified: Secondary | ICD-10-CM | POA: Insufficient documentation

## 2022-08-17 MED ORDER — TRAZODONE HCL 50 MG PO TABS: 50.0000 mg | ORAL_TABLET | Freq: Every day | ORAL | 1 refills | Status: AC

## 2022-08-17 MED ORDER — GABAPENTIN 100 MG PO CAPS: 100.0000 mg | ORAL_CAPSULE | Freq: Every day | ORAL | 1 refills | Status: AC

## 2022-08-17 NOTE — Progress Notes (Signed)
Subjective:    Patient ID: [name], female    DOB: 1941/03/26, 82 y.o.   MRN: [id number]  HPI  Pt is an 82 yr old female with hx of L MCA stroke in 3/23, HTN, CKD3a; hx of lung CA stage I; Asthma; anxiety with depression, hyperactive bladder; Glaucoma. Here for  f/u for L MCA stroke.  Hx of L TKR    Caregivers- still 11-12 hours/7 days/week- Came and stayed with daughter for 1 week at [facility] January got COVID- has weakened her. Balance is worse [place] last week  getting into the car- - hurt pride and R ankle- re-aggravated it. Tooka lot out of her.    Forgot had doctor's appt- even though told her earlier today that had a doctor's appointment.   Still having difficulty with word finding. Stroke was almost 1 year ago.   When by self, forgets to take meds- but [facility] when caregivers there.   Daughter noticed a big difference between Xmas til now.  At [facility] would walk to family room/bathroom without Rollator.  Now back to rollator all the time.  Getting up off the couch- takes a couple of times with rocking to get up off the couch now.   Difficulty with sequencing on getting briefs on with daughter today.    Not doing HEP- stopped when got COVID Pain Inventory Average Pain 0 Pain Right Now 0 My pain is  no pain  LOCATION OF PAIN  no pain  BOWEL Number of stools per week: normal Oral laxative use No  Type of laxative . Enema or suppository use No  History of colostomy No  Incontinent No   BLADDER Normal and Pads In and out cath, frequency . Able to self cath  . Bladder incontinence No  Frequent urination No  Leakage with coughing No  Difficulty starting stream No  Incomplete bladder emptying No    Mobility walk with assistance use a walker do you drive?  no  Function retired  Neuro/Psych trouble walking confusion  Prior Studies Any changes since last visit?  no  Physicians involved in your care Any changes since last visit?   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  Tobacco Use   Smoking status: Former    Packs/day: 0.75    Years: 20.00    Total pack years: 15.00    Types: Cigarettes    Quit date: 1980    Years since quitting: 44.1   Smokeless tobacco: Never  Vaping Use   Vaping Use: Never used  Substance and Sexual Activity   Alcohol use: Yes    Alcohol/week: 1.0 standard drink of alcohol    Types: 1 Glasses of wine per week    Comment: occasional   Drug use: No   Sexual activity: Not on file  Other Topics Concern   Not on file  Social History Narrative   ** Merged  History Encounter **       Social Determinants of Health   Financial Resource Strain: Not on file  Food Insecurity: Not on file  Transportation Needs: Not on file  Physical Activity: Not on file  Stress: Not on file  Social Connections: Not on file   Past Surgical History:  Procedure Laterality Date   APPENDECTOMY     COLONOSCOPY     EYE SURGERY Left    cataract removal   ORIF WRIST FRACTURE Left 08/04/2013   Procedure: OPEN REDUCTION INTERNAL FIXATION (ORIF) LEFT DISTAL RADIUS WRIST FRACTURE;  Surgeon: Wynonia Sours, MD;  Location: [place];  Service: Orthopedics;  Laterality: Left;   PARTIAL HYSTERECTOMY     TOTAL KNEE ARTHROPLASTY Left 08/26/2018   Procedure: TOTAL KNEE ARTHROPLASTY;  Surgeon: Netta Cedars, MD;  Location: [facility];  Service: Orthopedics;  Laterality: Left;   VIDEO ASSISTED THORACOSCOPY (VATS)/WEDGE RESECTION Right 02/12/2014   Procedure: RIGHT VIDEO ASSISTED THORACOSCOPY RIGHT LOWER LOBE LUNG  /WEDGE RESECTION,RIGHT THORACOTOMY WITH RIGHT LOWER LOBE LOBECTOMY & NODE DISSECTION;  Surgeon: Melrose Nakayama, MD;  Location: [place];  Service: Thoracic;  Laterality: Right;   VIDEO ASSISTED THORACOSCOPY (VATS)/WEDGE RESECTION Right 02/12/2014   VIDEO BRONCHOSCOPY N/A 02/12/2014   Procedure: VIDEO BRONCHOSCOPY;  Surgeon: Melrose Nakayama, MD;  Location: [place];  Service: Thoracic;  Laterality: N/A;   Past Medical History:  Diagnosis Date    Multiple pulmonary nodules, largest R mid lung 06/29/2013   Followed in Pulmonary clinic/ [facility]/ [place] - see CXR 06/27/13  - CT chest 07/14/2013 > 1. No acute findings in the thorax to account for the patient's  symptoms.  2. However, there is a subsolid nodule in the   right lower lobe that has a ground-glass attenuation component  measuring 2.5 x 1.8 cm, and a small central solid component  measuring 4 mm. Initial follow-up by chest CT without    Adenocarcinoma of lung, stage 1 ([place])    Status post right lower lobectomy August 2015   Anxiety    Asthma    Atrophic vaginitis    Chronic cough 12/20/2018   Chronic iritis of left eye    Pupil stays dilated; nonreactive   Chronic kidney disease, stage 3b ([facility])    Colon polyp    Contact lens/glasses fitting    wears contacts or glasses   Cough variant asthma with component of uacs  05/31/2013   Followed in Pulmonary clinic/ [facility]/ [place] in her 14's - Spirometry 02/23/13 wnl  - 07/03/2013 Sinus CT > Mild chronic sinus disease - No acute findings. Left-to-right nasal septal deviation of 3 mm. -med calendar 08/08/13 , redone 06/01/2016 and 02/22/2017  - Eos 4%   10/13/2013  > rec singulair daily  - FENO 05/12/2016  =   24 on singulair  - Allergy profile 05/12/2016 >   IgE  47 pos   Depression    Depression with anxiety    Dysuria 08/03/2019   Environmental allergies    Essential hypertension 02/23/2017   Changed losartan to avapro 02/22/2017 due to cough    GERD (gastroesophageal  reflux disease)    Hypertension    Hypertriglyceridemia    Kidney stones    OA (osteoarthritis)    Osteopenia    PONV (postoperative nausea and vomiting)    S/P lobectomy of lung 02/12/2014   Status post total knee replacement, left 08/26/2018   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   BP (!) 146/83  Pulse 80   Ht 5\' 2"  (1.575 m)   Wt 173 lb 6.4 oz (78.7 kg)   SpO2 95%   BMI 31.72 kg/m   Opioid Risk Score:   Fall Risk Score:  `1  Depression screen [facility] 2/9     01/28/2022   11:29 AM 10/22/2021    3:18 PM  Depression screen PHQ 2/9  Decreased Interest 1 0  Down, Depressed, Hopeless 1 0  PHQ - 2 Score 2 0  Altered sleeping  0  Tired, decreased energy  1  Change in appetite  0  Feeling bad or failure about yourself   0  Trouble concentrating  3  Moving slowly or fidgety/restless  2  Suicidal thoughts  0  PHQ-9 Score  6  Difficult doing work/chores  Very difficult      Review of Systems  Respiratory:  Positive for cough.   Musculoskeletal:  Positive for gait problem.  Psychiatric/Behavioral:  Positive for confusion.   All other systems reviewed and are negative.     Objective:   Physical Exam  Awake, alert, death of speech today; which is somewhat new; accompanied by daughter, using rollator to get around, NAD Strength in LE's 5-/5 in HF, KE, DF and PF B/L- is equal , but likely weaker form COVID.  Ue's 5 to 5-/5 in biceps, triceps, WE grip and FA B/L   Needs cues to get up and go as well as to participate in exam- cues as well as reminders- like how to do strength exam- which I would expect should would know.   Balance slightly impaired with closing eyes  Post COVID cough noted    Assessment & Plan:   Pt is an 82 yr old female with hx of L MCA stroke in 3/23, HTN, CKD3a; hx of lung CA stage I; Asthma; anxiety with depression, hyperactive bladder; Glaucoma. Here for  f/u for L MCA stroke.  Hx of L TKR   Restart Home exercise program 5 days/week.  Needs  to get back to moving s/p COVID- was in bed for 1-2 weeks.   2. Alexa might be verbal reminder to take meds.   3. Con't Gabapentin 100 mg QHS/nightly for sleep.   4. Con't Trazodone 50 mg QHS- for sleep  5. Basically at level she's going to be in- suggest ALF.   6. I think ALF is appropriate at this time - for safety and so can make the call what goes with her.   7. Send in Rx for gabapentin and Trazodone  8. F/U in 6 months.     I spent a total of   32  minutes on total care today- >50% coordination of care- due to d/w pt and daughter about dispo

## 2022-08-17 NOTE — Patient Instructions (Signed)
Pt is an 82 yr old female with hx of L MCA stroke in 3/23, HTN, CKD3a; hx of lung CA stage I; Asthma; anxiety with depression, hyperactive bladder; Glaucoma. Here for  f/u for L MCA stroke.  Hx of L TKR   Restart Home exercise program 5 days/week.  Needs to get back to moving s/p COVID- was in bed for 1-2 weeks.   2. Alexa might be verbal reminder to take meds.   3. Con't Gabapentin 100 mg QHS/nightly for sleep.   4. Con't Trazodone 50 mg QHS- for sleep  5. Basically at level she's going to be in- suggest ALF.   6. I think ALF is appropriate at this time - for safety and so can make the call what goes with her.   7. Send in Rx for gabapentin and Trazodone  8. F/U in 6 months.

## 2022-10-07 ENCOUNTER — Encounter (INDEPENDENT_AMBULATORY_CARE_PROVIDER_SITE_OTHER): Payer: Medicare Other | Admitting: Ophthalmology

## 2022-10-07 DIAGNOSIS — H353132 Nonexudative age-related macular degeneration, bilateral, intermediate dry stage: Secondary | ICD-10-CM

## 2022-10-07 DIAGNOSIS — I10 Essential (primary) hypertension: Secondary | ICD-10-CM

## 2022-10-07 DIAGNOSIS — H34831 Tributary (branch) retinal vein occlusion, right eye, with macular edema: Secondary | ICD-10-CM | POA: Diagnosis not present

## 2022-10-07 DIAGNOSIS — H35033 Hypertensive retinopathy, bilateral: Secondary | ICD-10-CM

## 2022-10-07 DIAGNOSIS — H43813 Vitreous degeneration, bilateral: Secondary | ICD-10-CM | POA: Diagnosis not present

## 2022-10-12 DIAGNOSIS — I69351 Hemiplegia and hemiparesis following cerebral infarction affecting right dominant side: Secondary | ICD-10-CM | POA: Diagnosis not present

## 2022-10-12 DIAGNOSIS — N1832 Chronic kidney disease, stage 3b: Secondary | ICD-10-CM | POA: Diagnosis not present

## 2022-10-12 DIAGNOSIS — E781 Pure hyperglyceridemia: Secondary | ICD-10-CM | POA: Diagnosis not present

## 2022-10-12 DIAGNOSIS — I129 Hypertensive chronic kidney disease with stage 1 through stage 4 chronic kidney disease, or unspecified chronic kidney disease: Secondary | ICD-10-CM | POA: Diagnosis not present

## 2022-10-12 DIAGNOSIS — K219 Gastro-esophageal reflux disease without esophagitis: Secondary | ICD-10-CM | POA: Diagnosis not present

## 2022-10-12 DIAGNOSIS — G4700 Insomnia, unspecified: Secondary | ICD-10-CM | POA: Diagnosis not present

## 2022-10-12 DIAGNOSIS — E559 Vitamin D deficiency, unspecified: Secondary | ICD-10-CM | POA: Diagnosis not present

## 2022-10-12 DIAGNOSIS — I69320 Aphasia following cerebral infarction: Secondary | ICD-10-CM | POA: Diagnosis not present

## 2022-10-12 DIAGNOSIS — M1990 Unspecified osteoarthritis, unspecified site: Secondary | ICD-10-CM | POA: Diagnosis not present

## 2022-10-12 DIAGNOSIS — H409 Unspecified glaucoma: Secondary | ICD-10-CM | POA: Diagnosis not present

## 2022-10-12 DIAGNOSIS — J45909 Unspecified asthma, uncomplicated: Secondary | ICD-10-CM | POA: Diagnosis not present

## 2022-10-19 DIAGNOSIS — H3530 Unspecified macular degeneration: Secondary | ICD-10-CM | POA: Diagnosis not present

## 2022-10-19 DIAGNOSIS — M8580 Other specified disorders of bone density and structure, unspecified site: Secondary | ICD-10-CM | POA: Diagnosis not present

## 2022-10-19 DIAGNOSIS — I10 Essential (primary) hypertension: Secondary | ICD-10-CM | POA: Diagnosis not present

## 2022-10-19 DIAGNOSIS — J4530 Mild persistent asthma, uncomplicated: Secondary | ICD-10-CM | POA: Diagnosis not present

## 2022-10-19 DIAGNOSIS — N1832 Chronic kidney disease, stage 3b: Secondary | ICD-10-CM | POA: Diagnosis not present

## 2022-10-19 DIAGNOSIS — I700 Atherosclerosis of aorta: Secondary | ICD-10-CM | POA: Diagnosis not present

## 2022-10-19 DIAGNOSIS — D638 Anemia in other chronic diseases classified elsewhere: Secondary | ICD-10-CM | POA: Diagnosis not present

## 2022-10-19 DIAGNOSIS — E7800 Pure hypercholesterolemia, unspecified: Secondary | ICD-10-CM | POA: Diagnosis not present

## 2022-10-19 DIAGNOSIS — K219 Gastro-esophageal reflux disease without esophagitis: Secondary | ICD-10-CM | POA: Diagnosis not present

## 2022-10-19 DIAGNOSIS — Z79899 Other long term (current) drug therapy: Secondary | ICD-10-CM | POA: Diagnosis not present

## 2022-11-01 ENCOUNTER — Emergency Department (HOSPITAL_BASED_OUTPATIENT_CLINIC_OR_DEPARTMENT_OTHER)
Admission: EM | Admit: 2022-11-01 | Discharge: 2022-11-01 | Disposition: A | Discharge status: Home / Self Care | Payer: Medicare Other | Attending: Emergency Medicine | Admitting: Emergency Medicine

## 2022-11-01 ENCOUNTER — Emergency Department (HOSPITAL_BASED_OUTPATIENT_CLINIC_OR_DEPARTMENT_OTHER): Payer: Medicare Other

## 2022-11-01 ENCOUNTER — Other Ambulatory Visit: Payer: Self-pay

## 2022-11-01 ENCOUNTER — Encounter (HOSPITAL_BASED_OUTPATIENT_CLINIC_OR_DEPARTMENT_OTHER): Payer: Self-pay | Admitting: Emergency Medicine

## 2022-11-01 DIAGNOSIS — W07XXXA Fall from chair, initial encounter: Secondary | ICD-10-CM | POA: Diagnosis not present

## 2022-11-01 DIAGNOSIS — S0990XA Unspecified injury of head, initial encounter: Secondary | ICD-10-CM | POA: Insufficient documentation

## 2022-11-01 DIAGNOSIS — S199XXA Unspecified injury of neck, initial encounter: Secondary | ICD-10-CM | POA: Diagnosis not present

## 2022-11-01 DIAGNOSIS — H5702 Anisocoria: Secondary | ICD-10-CM | POA: Insufficient documentation

## 2022-11-01 DIAGNOSIS — R519 Headache, unspecified: Secondary | ICD-10-CM | POA: Diagnosis not present

## 2022-11-01 DIAGNOSIS — W19XXXA Unspecified fall, initial encounter: Secondary | ICD-10-CM

## 2022-11-01 DIAGNOSIS — Z043 Encounter for examination and observation following other accident: Secondary | ICD-10-CM | POA: Diagnosis not present

## 2022-11-01 NOTE — ED Provider Notes (Signed)
[place] [facility]MENT AT [facility] Provider Note   CSN: [id number] Arrival date & time: 11/01/22  1846     History Chief Complaint  Patient presents with   Fall    HPI [name] is a 82 y.o. female presenting for chief ground-level fall.  She sat in a chair that broke and she fell backwards and back of her head.  She denies fevers chills nausea vomiting syncope or shortness of breath.  Asymptomatic and denies blood thinner use   Patient's recorded medical, surgical, social, medication list and allergies were reviewed in the Snapshot window as part of the initial history.   Review of Systems   Review of Systems  Constitutional:  Negative for chills and fever.  HENT:  Negative for ear pain and sore throat.   Eyes:  Negative for pain and visual disturbance.  Respiratory:  Negative for cough and shortness of breath.   Cardiovascular:  Negative for chest pain and palpitations.  Gastrointestinal:  Negative for abdominal pain and vomiting.  Genitourinary:  Negative for dysuria and hematuria.  Musculoskeletal:  Negative for arthralgias and back pain.  Skin:  Negative for color change and rash.  Neurological:  Negative for seizures and syncope.  All other systems reviewed and are negative.   Physical Exam Updated Vital Signs Temp 98.2 F (36.8 C) (Oral)   Ht 5\' 4"  (1.626 m)   BMI 29.76 kg/m  Physical Exam Vitals and nursing note reviewed.  Constitutional:      General: She is not in acute distress.    Appearance: She is well-developed.  HENT:     Head: Normocephalic and atraumatic.  Eyes:     Conjunctiva/sclera: Conjunctivae normal.  Cardiovascular:     Rate and Rhythm: Normal rate and regular rhythm.     Heart sounds: No murmur heard. Pulmonary:     Effort: Pulmonary effort is normal. No respiratory distress.     Breath sounds: Normal breath sounds.  Abdominal:     General: There is no distension.     Palpations: Abdomen is soft.      Tenderness: There is no abdominal tenderness. There is no right CVA tenderness or left CVA tenderness.  Musculoskeletal:        General: No swelling or tenderness. Normal range of motion.     Cervical back: Neck supple.  Skin:    General: Skin is warm and dry.  Neurological:     General: No focal deficit present.     Mental Status: She is alert and oriented to person, place, and time. Mental status is at baseline.     Cranial Nerves: No cranial nerve deficit.     Comments: Anisocoria appreciated.  Patient states that is her baseline.      ED Course/ Medical Decision Making/ A&P    Procedures Procedures   Medications Ordered in ED Medications - No data to display Medical Decision Making:   SHYRL OBI is a 82 y.o. female who presented to the ED today with a fall  detailed above. They are not on a blood thinner. Additional history discussed with patient's family/caregivers.   Complete initial physical exam performed, notably the patient  was hemodynamically stable  in no acute distress. No obvious deformities or injuries appreciated on extensive physical exam including active range of motion of all joints.     Reviewed and confirmed nursing documentation for past medical history, family history, social history.    Initial Assessment/Plan:   This is  a patient presenting with a moderate blunt mechanism trauma.  As such, I have considered intracranial injuries including intracranial hemorrhage, intrathoracic injuries including blunt myocardial or blunt lung injury, blunt abdominal injuries including aortic dissection, bladder injury, spleen injury, liver injury and I have considered orthopedic injuries including extremity or spinal injury. This is most consistent with an acute life/limb threatening illness complicated by underlying chronic conditions.  With the patient's presentation of moderate mechanism trauma but an otherwise reassuring exam, patient warrants targeted  evaluation for potential traumatic injuries. Will proceed with targeted evaluation for potential injuries. Will proceed with CT Head, Cervical Spine CT  Images reviewed and agree with radiology interpretation.  CT CERVICAL SPINE WO CONTRAST  Result Date: 11/01/2022 CLINICAL DATA:  Fall, hit back of head EXAM: CT CERVICAL SPINE WITHOUT CONTRAST TECHNIQUE: Multidetector CT imaging of the cervical spine was performed without intravenous contrast. Multiplanar CT image reconstructions were also generated. RADIATION DOSE REDUCTION: This exam was performed according to the departmental dose-optimization program which includes automated exposure control, adjustment of the mA and/or kV according to patient size and/or use of iterative reconstruction technique. COMPARISON:  04/17/2015 FINDINGS: Alignment: Straightening of the cervical spine. Trace anterolisthesis C3 on C4. Facet alignment is within normal limits. Skull base and vertebrae: No acute fracture. No primary bone lesion or focal pathologic process. Soft tissues and spinal canal: No prevertebral fluid or swelling. No visible canal hematoma. Disc levels: Advanced C1-C2 degenerative change. Moderate severe disc space narrowing C3 through C7 with osteophyte. Facet degenerative changes at multiple levels with foraminal narrowing. Upper chest: Negative. Other: None IMPRESSION: Straightening of the cervical spine with trace anterolisthesis C3 on C4, likely degenerative. No acute osseous abnormality. Electronically Signed   By: Jasmine Pang M.D.   On: 11/01/2022 19:50   CT HEAD WO CONTRAST ( )  Result Date: 11/01/2022 CLINICAL DATA:  Neck trauma. EXAM: CT HEAD WITHOUT CONTRAST TECHNIQUE: Contiguous axial images were obtained from the base of the skull through the vertex without intravenous contrast. RADIATION DOSE REDUCTION: This exam was performed according to the departmental dose-optimization program which includes automated exposure control, adjustment of  the mA and/or kV according to patient size and/or use of iterative reconstruction technique. COMPARISON:  September 21, 2021 FINDINGS: Brain: No evidence of acute infarction, hemorrhage, hydrocephalus, extra-axial collection or mass lesion/mass effect. Encephalomalacia from prior left MCA territory infarct. Moderate brain parenchymal volume loss and microangiopathy. Vascular: No hyperdense vessel or unexpected calcification. Skull: Normal. Negative for fracture or focal lesion. Sinuses/Orbits: No acute finding. Other: None. IMPRESSION: No evidence of acute injury to the head. Encephalomalacia from prior left MCA territory infarct. Electronically Signed   By: Ted Mcalpine M.D.   On: 11/01/2022 19:42    Disposition:  I have considered need for hospitalization, however, considering all of the above, I believe this patient is stable for discharge at this time.  Patient/family educated about specific return precautions for given chief complaint and symptoms.  Patient/family educated about follow-up with PCP.     Patient/family expressed understanding of return precautions and need for follow-up. Patient spoken to regarding all imaging and laboratory results and appropriate follow up for these results. All education provided in verbal form with additional information in written form. Time was allowed for answering of patient questions. Patient discharged.    Emergency Department Medication Summary:   Medications - No data to display        Clinical Impression:  1. Fall, initial encounter      Discharge  Final Clinical Impression(s) / ED Diagnoses Final diagnoses:  Fall, initial encounter    Rx / DC Orders ED Discharge Orders     None         Glyn Ade, MD 11/01/22 2004

## 2022-11-01 NOTE — ED Notes (Signed)
Patient taken to CT at this time.

## 2022-11-01 NOTE — ED Triage Notes (Signed)
Mechanical fall today. Struck back of head. Denies blood thinners. Denies loc. HX of two CVA's in March 2023. Baseline aphasia. Unequal pupils at baseline per patient.

## 2022-11-01 NOTE — ED Notes (Signed)
Patient returned from CT at this time.

## 2022-11-02 DIAGNOSIS — W19XXXA Unspecified fall, initial encounter: Secondary | ICD-10-CM | POA: Diagnosis not present

## 2022-11-02 DIAGNOSIS — Z09 Encounter for follow-up examination after completed treatment for conditions other than malignant neoplasm: Secondary | ICD-10-CM | POA: Diagnosis not present

## 2022-12-09 ENCOUNTER — Encounter (INDEPENDENT_AMBULATORY_CARE_PROVIDER_SITE_OTHER): Payer: Medicare Other | Admitting: Ophthalmology

## 2022-12-09 DIAGNOSIS — H353132 Nonexudative age-related macular degeneration, bilateral, intermediate dry stage: Secondary | ICD-10-CM | POA: Diagnosis not present

## 2022-12-09 DIAGNOSIS — H35033 Hypertensive retinopathy, bilateral: Secondary | ICD-10-CM

## 2022-12-09 DIAGNOSIS — I10 Essential (primary) hypertension: Secondary | ICD-10-CM | POA: Diagnosis not present

## 2022-12-09 DIAGNOSIS — H43813 Vitreous degeneration, bilateral: Secondary | ICD-10-CM | POA: Diagnosis not present

## 2022-12-09 DIAGNOSIS — H34831 Tributary (branch) retinal vein occlusion, right eye, with macular edema: Secondary | ICD-10-CM

## 2022-12-18 ENCOUNTER — Telehealth: Payer: Self-pay | Admitting: Physical Medicine and Rehabilitation

## 2022-12-18 NOTE — Telephone Encounter (Signed)
Her daughter Lamar Laundry called and wanted to talk to Dr Berline Chough about some concerns she has that needs to be talked about before her mom's appointment

## 2022-12-21 NOTE — Telephone Encounter (Signed)
Have left message for daughter- tried 2x and got voicemail- left generic voicemail for daughter-

## 2022-12-22 NOTE — Telephone Encounter (Signed)
Spoke with daughter Concerned- pt tried to drive by herself-  Also pt doesn't want to go to ALF, so still needing caregivers.

## 2023-02-10 ENCOUNTER — Encounter: Payer: Self-pay | Admitting: Physical Medicine and Rehabilitation

## 2023-02-10 ENCOUNTER — Encounter (INDEPENDENT_AMBULATORY_CARE_PROVIDER_SITE_OTHER): Payer: Medicare Other | Admitting: Ophthalmology

## 2023-02-10 ENCOUNTER — Encounter
Payer: Medicare Other | Attending: Physical Medicine and Rehabilitation | Admitting: Physical Medicine and Rehabilitation

## 2023-02-10 VITALS — BP 120/83 | HR 73 | Ht 64.0 in | Wt 159.0 lb

## 2023-02-10 DIAGNOSIS — I69320 Aphasia following cerebral infarction: Secondary | ICD-10-CM | POA: Diagnosis not present

## 2023-02-10 DIAGNOSIS — I639 Cerebral infarction, unspecified: Secondary | ICD-10-CM | POA: Insufficient documentation

## 2023-02-10 DIAGNOSIS — R4701 Aphasia: Secondary | ICD-10-CM | POA: Insufficient documentation

## 2023-02-10 DIAGNOSIS — I63512 Cerebral infarction due to unspecified occlusion or stenosis of left middle cerebral artery: Secondary | ICD-10-CM | POA: Insufficient documentation

## 2023-02-10 NOTE — Progress Notes (Signed)
Subjective:    Patient ID: [name], female    DOB: Dec 01, 1940, 82 y.o.   MRN: [id number]  HPI   Pt is an 82 yr old female with hx of L MCA stroke in 3/23, HTN, CKD3a; hx of lung CA stage I; Asthma; anxiety with depression, hyperactive bladder; Glaucoma. Here for  f/u for L MCA stroke.  Hx of L TKR  Things "fine" right where it is- 10-12 hours/day caregivers per pt/daughter.   Going to run out of money for caregivers pretty soon.    Speech still an issue- coming up with words.  Daughter sees decline in STM-  and Son, Minerva Areola agrees.   Forgetting to take meds.    Family has to call Sunday, since no caregiver-  Phone, alert and RW all in bedroom- and sitting in family room.   Doesn't get out of house too much.  And STM is going down.  Per family.     Pain Inventory Average Pain 1 Pain Right Now 1 My pain is intermittent, dull, and aching  LOCATION OF PAIN  Right foot Pain  BOWEL Number of stools per week: 7 Oral laxative use No    BLADDER Normal    Mobility use a walker ability to climb steps?  yes do you drive?  no Do you have any goals in this area?  yes  Function retired I need assistance with the following:  dressing, bathing, meal prep, household duties, and shopping Do you have any goals in this area?  yes  Neuro/Psych tremor trouble walking dizziness depression anxiety  Prior Studies Any changes since last visit?  yes CT after fall  Physicians involved in your care Any changes since last visit?  no   Family History  Problem Relation Age of Onset   Stroke Father    Asthma Father    Heart disease Father        CABG x 2   Lung cancer Sister        smoked   Breast cancer Daughter 55   Social History   Socioeconomic History   Marital status: Widowed    Spouse name: Not on file   Number of children: 2   Years of education: Not on file   Highest education level: Not on file  Occupational History   Occupation: IT trainer firm-  retired  Tobacco Use   Smoking status: Former    Current packs/day: 0.00    Average packs/day: 0.8 packs/day for 20.0 years (15.0 ttl pk-yrs)    Types: Cigarettes    Start date: 67    Quit date: 1980    Years since quitting: 44.6   Smokeless tobacco: Never  Vaping Use   Vaping status: Never Used  Substance and Sexual Activity   Alcohol use: Yes    Alcohol/week: 1.0 standard drink of alcohol    Types: 1 Glasses of wine per week    Comment: occasional   Drug use: No   Sexual activity: Not on file  Other Topics Concern   Not on file  Social History Narrative   ** Merged History Encounter **       Social Determinants of Health   Financial Resource Strain: Not on file  Food Insecurity: Not on file  Transportation Needs: Not on file  Physical Activity: Not on file  Stress: Not on file  Social Connections: Not on file   Past Surgical History:  Procedure Laterality Date   APPENDECTOMY     COLONOSCOPY  EYE SURGERY Left    cataract removal   ORIF WRIST FRACTURE Left 08/04/2013   Procedure: OPEN REDUCTION INTERNAL FIXATION (ORIF) LEFT DISTAL RADIUS WRIST FRACTURE;  Surgeon: Nicki Reaper, MD;  Location: [facility];  Service: Orthopedics;  Laterality: Left;   PARTIAL HYSTERECTOMY     TOTAL KNEE ARTHROPLASTY Left 08/26/2018   Procedure: TOTAL KNEE ARTHROPLASTY;  Surgeon: Beverely Low, MD;  Location: [facility];  Service: Orthopedics;  Laterality: Left;   VIDEO ASSISTED THORACOSCOPY (VATS)/WEDGE RESECTION Right 02/12/2014   Procedure: RIGHT VIDEO ASSISTED THORACOSCOPY RIGHT LOWER LOBE LUNG /WEDGE RESECTION,RIGHT THORACOTOMY WITH RIGHT LOWER LOBE LOBECTOMY & NODE DISSECTION;  Surgeon: Loreli Slot, MD;  Location: [facility];  Service: Thoracic;  Laterality: Right;   VIDEO ASSISTED THORACOSCOPY (VATS)/WEDGE RESECTION Right 02/12/2014   VIDEO BRONCHOSCOPY N/A 02/12/2014   Procedure: VIDEO BRONCHOSCOPY;  Surgeon: Loreli Slot, MD;  [facility];  Service:  Thoracic;  Laterality: N/A;   Past Medical History:  Diagnosis Date    Multiple pulmonary nodules, largest R mid lung 06/29/2013   Followed in Pulmonary clinic/ [facility]/ [facility] - see CXR 06/27/13  - CT chest 07/14/2013 > 1. No acute findings in the thorax to account for the patient's  symptoms.  2. However, there is a subsolid nodule in the   right lower lobe that has a ground-glass attenuation component  measuring 2.5 x 1.8 cm, and a small central solid component  measuring 4 mm. Initial follow-up by chest CT without    Adenocarcinoma of lung, stage 1 (HCC)    Status post right lower lobectomy August 2015   Anxiety    Asthma    Atrophic vaginitis    Chronic cough 12/20/2018   Chronic iritis of left eye    Pupil stays dilated; nonreactive   Chronic kidney disease, stage 3b (HCC)    Colon polyp    Contact lens/glasses fitting    wears contacts or glasses   Cough variant asthma with component of uacs  05/31/2013   Followed in Pulmonary clinic/ [facility]/ [facility] in her 40's - Spirometry 02/23/13 wnl  - 07/03/2013 Sinus CT > Mild chronic sinus disease - No acute findings. Left-to-right nasal septal deviation of 3 mm. -med calendar 08/08/13 , redone 06/01/2016 and 02/22/2017  - Eos 4%   10/13/2013  > rec singulair daily  - FENO 05/12/2016  =   24 on singulair  - Allergy profile 05/12/2016 >   IgE  47 pos   Depression    Depression with anxiety    Dysuria 08/03/2019   Environmental allergies    Essential hypertension 02/23/2017   Changed losartan to avapro 02/22/2017 due to cough    GERD (gastroesophageal reflux disease)    Hypertension    Hypertriglyceridemia    Kidney stones    OA (osteoarthritis)    Osteopenia    PONV (postoperative nausea and vomiting)    S/P lobectomy of lung 02/12/2014   Status post total knee replacement, left 08/26/2018   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   Ht 5\' 4"  (1.626 m)   Wt 159 lb (72.1 kg)   BMI 27.29 kg/m   Opioid Risk Score:   Fall  Risk Score:  `1  Depression screen [facility] 2/9     02/10/2023    9:09 AM 01/28/2022   11:29 AM 10/22/2021    3:18 PM  Depression screen PHQ 2/9  Decreased Interest 1 1 0  Down, Depressed, Hopeless 1 1 0  PHQ - 2 Score 2 2 0  Altered sleeping   0  Tired, decreased energy   1  Change in appetite   0  Feeling bad or failure about yourself    0  Trouble concentrating   3  Moving slowly or fidgety/restless   2  Suicidal thoughts   0  PHQ-9 Score   6  Difficult doing work/chores   Very difficult    Review of Systems  Musculoskeletal:  Positive for gait problem.  Neurological:  Positive for dizziness, tremors and light-headedness.  Psychiatric/Behavioral:         Depression, Anxiety  All other systems reviewed and are negative.     Objective:   Physical Exam  Awake, alert, but delayed speech still noticeable, NAD Word finding deficits still noticeable. Accompanied by daughter Lamar Laundry  and Minerva Areola, son on phone/facetime  Dearth of words when speaks- increased time to say what she plans on saying.  Using RW to walk Also word substitution on son's name      Assessment & Plan:   Pt is an 82 yr old female with hx of L MCA stroke in 3/23, HTN, CKD3a; hx of lung CA stage I; Asthma; anxiety with depression, hyperactive bladder; Glaucoma. Here for  f/u for L MCA stroke.  Hx of L TKR   Suggest med box that alarms to get her to take meds.  2. Insists on staying at home- no matter what.   3.  Suggest moving closer to family- and going to ALF.    4. Daughter going back to work part time- and cannot come visit-  will work on Doctor, general practice- the dog.   5. 20% of patients who make 80, make it to 100! Mother lived to 70-74 years old.    6. First step, lets work on going to visit- ALF.   7. No driving! Don't see this changing.   8. Con't gabapentin 100 mg nightly if wants to- as needed-  and Trazodone 50 mg at bedtime for sleep.   9. F/U in 6 months-  10. Tylenol for pain-   I spent a total  of 32   minutes on total care today- >50% coordination of care- due to  D/w pt and family need to go to ALF.

## 2023-02-10 NOTE — Patient Instructions (Signed)
Pt is an 82 yr old female with hx of L MCA stroke in 3/23, HTN, CKD3a; hx of lung CA stage I; Asthma; anxiety with depression, hyperactive bladder; Glaucoma. Here for  f/u for L MCA stroke.  Hx of L TKR   Suggest med box that alarms to get her to take meds.  2. Insists on staying at home- no matter what.   3.  Suggest moving closer to family- and going to ALF.    4. Daughter going back to work part time- and cannot come visit-  will work on Doctor, general practice- the dog.   5. 20% of patients who make 80, make it to 100! Mother lived to 74-58 years old.    6. First step, lets work on going to visit- ALF.   7. No driving! Don't see this changing.   8. Con't gabapentin 100 mg nightly if wants to- as needed-  and Trazodone 50 mg at bedtime for sleep.   9. F/U in 6 months-  10. Tylenol for pain-

## 2023-02-17 ENCOUNTER — Encounter (INDEPENDENT_AMBULATORY_CARE_PROVIDER_SITE_OTHER): Payer: Medicare Other | Admitting: Ophthalmology

## 2023-02-17 DIAGNOSIS — H353132 Nonexudative age-related macular degeneration, bilateral, intermediate dry stage: Secondary | ICD-10-CM | POA: Diagnosis not present

## 2023-02-17 DIAGNOSIS — H43813 Vitreous degeneration, bilateral: Secondary | ICD-10-CM

## 2023-02-17 DIAGNOSIS — H34831 Tributary (branch) retinal vein occlusion, right eye, with macular edema: Secondary | ICD-10-CM

## 2023-02-17 DIAGNOSIS — H35033 Hypertensive retinopathy, bilateral: Secondary | ICD-10-CM

## 2023-02-17 DIAGNOSIS — I10 Essential (primary) hypertension: Secondary | ICD-10-CM

## 2023-02-19 ENCOUNTER — Ambulatory Visit: Payer: Medicare Other | Admitting: Physical Medicine and Rehabilitation

## 2023-03-15 ENCOUNTER — Other Ambulatory Visit: Payer: Self-pay | Admitting: Thoracic Surgery (Cardiothoracic Vascular Surgery)

## 2023-03-15 DIAGNOSIS — Z85118 Personal history of other malignant neoplasm of bronchus and lung: Secondary | ICD-10-CM

## 2023-03-17 ENCOUNTER — Emergency Department (HOSPITAL_COMMUNITY): Payer: Medicare Other

## 2023-03-17 ENCOUNTER — Encounter (HOSPITAL_COMMUNITY): Payer: Self-pay | Admitting: Emergency Medicine

## 2023-03-17 ENCOUNTER — Other Ambulatory Visit: Payer: Self-pay

## 2023-03-17 ENCOUNTER — Emergency Department (HOSPITAL_COMMUNITY)
Admission: EM | Admit: 2023-03-17 | Discharge: 2023-03-17 | Disposition: A | Discharge status: Home / Self Care | Payer: Medicare Other | Attending: Emergency Medicine | Admitting: Emergency Medicine

## 2023-03-17 DIAGNOSIS — G319 Degenerative disease of nervous system, unspecified: Secondary | ICD-10-CM | POA: Diagnosis not present

## 2023-03-17 DIAGNOSIS — R404 Transient alteration of awareness: Secondary | ICD-10-CM | POA: Diagnosis not present

## 2023-03-17 DIAGNOSIS — R4701 Aphasia: Secondary | ICD-10-CM | POA: Diagnosis not present

## 2023-03-17 DIAGNOSIS — I6523 Occlusion and stenosis of bilateral carotid arteries: Secondary | ICD-10-CM | POA: Diagnosis not present

## 2023-03-17 DIAGNOSIS — I6782 Cerebral ischemia: Secondary | ICD-10-CM | POA: Insufficient documentation

## 2023-03-17 DIAGNOSIS — Z8673 Personal history of transient ischemic attack (TIA), and cerebral infarction without residual deficits: Secondary | ICD-10-CM | POA: Diagnosis not present

## 2023-03-17 DIAGNOSIS — N189 Chronic kidney disease, unspecified: Secondary | ICD-10-CM | POA: Insufficient documentation

## 2023-03-17 DIAGNOSIS — Z79899 Other long term (current) drug therapy: Secondary | ICD-10-CM | POA: Diagnosis not present

## 2023-03-17 DIAGNOSIS — Z7982 Long term (current) use of aspirin: Secondary | ICD-10-CM | POA: Insufficient documentation

## 2023-03-17 DIAGNOSIS — I129 Hypertensive chronic kidney disease with stage 1 through stage 4 chronic kidney disease, or unspecified chronic kidney disease: Secondary | ICD-10-CM | POA: Diagnosis not present

## 2023-03-17 DIAGNOSIS — N1832 Chronic kidney disease, stage 3b: Secondary | ICD-10-CM | POA: Insufficient documentation

## 2023-03-17 DIAGNOSIS — W19XXXA Unspecified fall, initial encounter: Secondary | ICD-10-CM | POA: Diagnosis not present

## 2023-03-17 DIAGNOSIS — R531 Weakness: Secondary | ICD-10-CM | POA: Diagnosis not present

## 2023-03-17 DIAGNOSIS — R410 Disorientation, unspecified: Secondary | ICD-10-CM | POA: Diagnosis not present

## 2023-03-17 DIAGNOSIS — R55 Syncope and collapse: Secondary | ICD-10-CM | POA: Insufficient documentation

## 2023-03-17 DIAGNOSIS — Z743 Need for continuous supervision: Secondary | ICD-10-CM | POA: Diagnosis not present

## 2023-03-17 DIAGNOSIS — R29898 Other symptoms and signs involving the musculoskeletal system: Secondary | ICD-10-CM | POA: Diagnosis not present

## 2023-03-17 LAB — APTT: aPTT: 23 s — ABNORMAL LOW (ref 24–36)

## 2023-03-17 LAB — I-STAT CHEM 8, ED
BUN: 22 mg/dL (ref 8–23)
Calcium, Ion: 1.14 mmol/L — ABNORMAL LOW (ref 1.15–1.40)
Chloride: 104 mmol/L (ref 98–111)
Creatinine, Ser: 1.5 mg/dL — ABNORMAL HIGH (ref 0.44–1.00)
Glucose, Bld: 92 mg/dL (ref 70–99)
HCT: 40 % (ref 36.0–46.0)
Hemoglobin: 13.6 g/dL (ref 12.0–15.0)
Potassium: 4.2 mmol/L (ref 3.5–5.1)
Sodium: 137 mmol/L (ref 135–145)
TCO2: 21 mmol/L — ABNORMAL LOW (ref 22–32)

## 2023-03-17 LAB — COMPREHENSIVE METABOLIC PANEL
ALT: 15 U/L (ref 0–44)
AST: 28 U/L (ref 15–41)
Albumin: 3.4 g/dL — ABNORMAL LOW (ref 3.5–5.0)
Alkaline Phosphatase: 35 U/L — ABNORMAL LOW (ref 38–126)
Anion gap: 13 (ref 5–15)
BUN: 22 mg/dL (ref 8–23)
CO2: 20 mmol/L — ABNORMAL LOW (ref 22–32)
Calcium: 9.5 mg/dL (ref 8.9–10.3)
Chloride: 103 mmol/L (ref 98–111)
Creatinine, Ser: 1.49 mg/dL — ABNORMAL HIGH (ref 0.44–1.00)
GFR, Estimated: 35 mL/min — ABNORMAL LOW (ref >60)
Glucose, Bld: 97 mg/dL (ref 70–99)
Potassium: 4.2 mmol/L (ref 3.5–5.1)
Sodium: 136 mmol/L (ref 135–145)
Total Bilirubin: 0.5 mg/dL (ref 0.3–1.2)
Total Protein: 6.8 g/dL (ref 6.5–8.1)

## 2023-03-17 LAB — ETHANOL: Alcohol, Ethyl (B): <10 mg/dL (ref <10)

## 2023-03-17 LAB — CBC
HCT: 38.9 % (ref 36.0–46.0)
Hemoglobin: 12.5 g/dL (ref 12.0–15.0)
MCH: 30.6 pg (ref 26.0–34.0)
MCHC: 32.1 g/dL (ref 30.0–36.0)
MCV: 95.3 fL (ref 80.0–100.0)
Platelets: 337 10*3/uL (ref 150–400)
RBC: 4.08 MIL/uL (ref 3.87–5.11)
RDW: 13.7 % (ref 11.5–15.5)
WBC: 6.4 10*3/uL (ref 4.0–10.5)
nRBC: 0 % (ref 0.0–0.2)

## 2023-03-17 LAB — PROTIME-INR
INR: 1.1 (ref 0.8–1.2)
Prothrombin Time: 14.1 s (ref 11.4–15.2)

## 2023-03-17 LAB — DIFFERENTIAL
Abs Immature Granulocytes: 0.01 10*3/uL (ref 0.00–0.07)
Basophils Absolute: 0.1 10*3/uL (ref 0.0–0.1)
Basophils Relative: 1 %
Eosinophils Absolute: 0.3 10*3/uL (ref 0.0–0.5)
Eosinophils Relative: 5 %
Immature Granulocytes: 0 %
Lymphocytes Relative: 18 %
Lymphs Abs: 1.1 10*3/uL (ref 0.7–4.0)
Monocytes Absolute: 0.6 10*3/uL (ref 0.1–1.0)
Monocytes Relative: 10 %
Neutro Abs: 4.3 10*3/uL (ref 1.7–7.7)
Neutrophils Relative %: 66 %

## 2023-03-17 LAB — CBG MONITORING, ED: Glucose-Capillary: 94 mg/dL (ref 70–99)

## 2023-03-17 LAB — TROPONIN I (HIGH SENSITIVITY): Troponin I (High Sensitivity): 6 ng/L (ref <18)

## 2023-03-17 NOTE — ED Notes (Signed)
Primary RN with patient and stroke team to MRI.

## 2023-03-17 NOTE — Code Documentation (Signed)
Stroke Response Nurse Documentation Code Documentation  [name] is a 82 y.o. female arriving to [facility]  via [facility] on 9/11 with past medical hx of CVA with residual aphasia and weakness d/t same, CKD, HTN. On aspirin 81 mg daily. Code stroke was activated by EMS.   Patient from home where she was LKW at 1125 and now complaining of aphasia and weakness. Her home health nurse witnessed her fall, after which time she was more confused and had some more weakness than normal.   Stroke team at the bedside on patient arrival. Labs drawn and patient cleared for CT by EDP. Patient to CT with team. NIHSS 4, see documentation for details and code stroke times. Patient with disoriented, left decreased sensation, Expressive aphasia , and Sensory  neglect on exam. The following imaging was completed:  CT Head and MRI. Patient is not a candidate for IV Thrombolytic due to no stroke on MRI per MD. Patient is not not a candidate for IR due to no LVO suspected per MD.   Care Plan: q2 hr NIHSS.   Bedside handoff with ED RN Florentina Addison.    Pearlie Oyster  Stroke Response RN

## 2023-03-17 NOTE — ED Notes (Signed)
[name] reports not a stroke  waiting on the results- of a tronon  no other stroke orders required  abigail  pa giving orders

## 2023-03-17 NOTE — ED Notes (Signed)
Called lab to add trop level

## 2023-03-17 NOTE — ED Triage Notes (Signed)
Per [facility] pt coming from home with LKN of 11:25. Home health aide was with patient when patient had a witnessed fall. Denies hitting her head. States patient began having aphasia and left sided weakness since fall.

## 2023-03-17 NOTE — Consult Note (Signed)
Neurology Consultation Reason for Consult: Code stroke Requesting Physician: Gwyneth Sprout  CC: Leg pain   History is obtained from: Caregiver, daughter, chart review Caregiver Chanda phone #[phone]  HPI: [name] is a 82 y.o. 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  She just taken a warm shower and in that setting was holding onto a dresser when she suddenly fell.  Afterwards she complained of leg pain but the caregiver reports her speech was at baseline and she did not seem any weaker than normal however due to leg pain EMS was activated.  Per EMS report her aphasia was worse than her normal  Per review of records and confirmed with daughter Martie Round, patient has been recommended for transition to assisted living facility and they are working on finding a facility near the daughter in [place]:  Thrombolytic given?: No, no new stroke on imaging IA performed?: No, exam not c/w new LVO Premorbid modified rankin scale:      4 - Moderately severe disability. Unable to attend to own bodily needs without assistance, and unable to walk unassisted.  Uses a walker, caregiver 6/7 days a week during the day time and transitioning to ALF    ROS:  Unable to obtain due to altered mental status.   Past Medical History:  Diagnosis Date    Multiple pulmonary nodules, largest R mid lung 06/29/2013   Followed in Pulmonary clinic/ [facility]/ [facility] - see CXR 06/27/13  - CT chest 07/14/2013 > 1. No acute findings in the thorax to account for the patient's  symptoms.  2. However, there is a subsolid nodule in the    right lower lobe that has a ground-glass attenuation component  measuring 2.5 x 1.8 cm, and a small central solid component  measuring 4 mm. Initial follow-up by chest CT without    Adenocarcinoma of lung, stage 1 (HCC)    Status post right lower lobectomy August 2015   Anxiety    Asthma    Atrophic vaginitis    Chronic cough 12/20/2018   Chronic iritis of left eye    Pupil stays dilated; nonreactive   Chronic kidney disease, stage 3b (HCC)    Colon polyp    Contact lens/glasses fitting    wears contacts or glasses   Cough variant asthma with component of uacs  05/31/2013   Followed in Pulmonary clinic/ [facility]/ [facility] in her 40's - Spirometry 02/23/13 wnl  - 07/03/2013 Sinus CT > Mild chronic sinus disease - No acute findings. Left-to-right nasal septal deviation of 3 mm. -med calendar 08/08/13 , redone 06/01/2016 and 02/22/2017  - Eos 4%   10/13/2013  > rec singulair daily  - FENO 05/12/2016  =   24 on singulair  - Allergy profile 05/12/2016 >   IgE  47 pos   Depression    Depression with anxiety    Dysuria 08/03/2019   Environmental allergies    Essential hypertension 02/23/2017   Changed losartan to avapro 02/22/2017 due to cough    GERD (gastroesophageal reflux disease)    Hypertension    Hypertriglyceridemia    Kidney stones    OA (osteoarthritis)    Osteopenia  PONV (postoperative nausea and vomiting)    S/P lobectomy of lung 02/12/2014   Status post total knee replacement, left 08/26/2018   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   Past Surgical History:  Procedure Laterality Date   APPENDECTOMY     COLONOSCOPY     EYE SURGERY Left    cataract removal   ORIF WRIST FRACTURE Left 08/04/2013   Procedure: OPEN REDUCTION INTERNAL FIXATION (ORIF) LEFT DISTAL RADIUS WRIST FRACTURE;  Surgeon: Nicki Reaper, MD;  Location: [facility];  Service: Orthopedics;  Laterality: Left;   PARTIAL HYSTERECTOMY     TOTAL KNEE ARTHROPLASTY Left 08/26/2018   Procedure: TOTAL  KNEE ARTHROPLASTY;  Surgeon: Beverely Low, MD;  Location: [facility];  Service: Orthopedics;  Laterality: Left;   VIDEO ASSISTED THORACOSCOPY (VATS)/WEDGE RESECTION Right 02/12/2014   Procedure: RIGHT VIDEO ASSISTED THORACOSCOPY RIGHT LOWER LOBE LUNG /WEDGE RESECTION,RIGHT THORACOTOMY WITH RIGHT LOWER LOBE LOBECTOMY & NODE DISSECTION;  Surgeon: Loreli Slot, MD;  Location: [facility];  Service: Thoracic;  Laterality: Right;   VIDEO ASSISTED THORACOSCOPY (VATS)/WEDGE RESECTION Right 02/12/2014   VIDEO BRONCHOSCOPY N/A 02/12/2014   Procedure: VIDEO BRONCHOSCOPY;  Surgeon: Loreli Slot, MD;  Location: [facility];  Service: Thoracic;  Laterality: N/A;   Current Outpatient Medications  Medication Instructions   albuterol (VENTOLIN HFA) 108 (90 Base) MCG/ACT inhaler TAKE 2 PUFFS BY MOUTH EVERY 6 HOURS AS NEEDED FOR WHEEZE OR SHORTNESS OF BREATH   alendronate (FOSAMAX) 70 MG tablet No dose, route, or frequency recorded.   amLODipine (NORVASC) 5 MG tablet 5 mg by oral route.   amLODipine (NORVASC) 10 mg, Oral, Every morning   aspirin EC 81 mg, Oral, Daily, Swallow whole.   Besifloxacin HCl (BESIVANCE) 0.6 % SUSP No dose, route, or frequency recorded.   budesonide-formoterol (SYMBICORT) 80-4.5 MCG/ACT inhaler TAKE 2 PUFFS BY MOUTH TWICE A DAY   Calcium-Magnesium-Vitamin D (CALCIUM 1200+D3 PO) 1 tablet, Oral, Daily   chlorpheniramine (CHLOR-TRIMETON) 4 MG tablet    Cholecalciferol (VITAMIN D3 PO) 1 capsule, Oral, Daily   dorzolamide-timolol (COSOPT) 22.3-6.8 MG/ML ophthalmic solution 1 drop, Both Eyes, 2 times daily   famotidine (PEPCID) 20 MG tablet 1 tablet at bedtime as needed   fenofibrate 160 mg, Oral, Daily   FLUoxetine (PROZAC) 40 MG capsule 1 capsule   gabapentin (NEURONTIN) 100 mg, Oral, Daily at bedtime, One three times daily   losartan (COZAAR) 100 MG tablet    montelukast (SINGULAIR) 10 mg, Oral, Daily at bedtime   Multiple Vitamins-Minerals (CENTRUM SILVER PO) 1 tablet, Daily    solifenacin (VESICARE) 10 mg, Daily   traZODone (DESYREL) 50 mg, Oral, Daily at bedtime   vitamin C 100 mg, Oral, Daily    Family History  Problem Relation Age of Onset   Stroke Father    Asthma Father    Heart disease Father        CABG x 2   Lung cancer Sister        smoked   Breast cancer Daughter 38    Social History:  reports that she quit smoking about 44 years ago. Her smoking use included cigarettes. She started smoking about 64 years ago. She has a 15 pack-year smoking history. She has never used smokeless tobacco. She reports current alcohol use of about 1.0 standard drink of alcohol per week. She reports that she does not use drugs.   Exam: Current vital signs: Wt 73.4 kg   BMI 27.78 kg/m  Vital signs in last 24 hours:  Weight:  [73.4 kg] 73.4 kg (09/11 1200)   Physical Exam  Constitutional: Appears well-developed and well-nourished.  Psych: Affect mildly anxious but cooperative Eyes: No scleral injection HENT: No oropharyngeal obstruction.  MSK: no major joint deformities.  Cardiovascular: Perfusing extremities well Respiratory: Effort normal, non-labored breathing GI: Soft.  No distension. There is no tenderness.  Skin: Warm dry and intact visible skin  Neuro: Mental Status: Patient is awake, alert, following simple commands, poor attention/concentration, mild to moderate aphasia which appears at her baseline as described by family (does tend to have exacerbations with stress).  At times does seem to have some mild right-sided neglect on double simultaneous stimuli Cranial Nerves: II: Visual Fields are full.  Postsurgical left pupil, reactive right pupil III,IV, VI: EOMI to orienting to examiner V: Facial sensation is symmetric to temperature VII: Facial movement is symmetric.  VIII: hearing is intact to voice XII: tongue is midline without atrophy or fasciculations.  Motor: Good antigravity strength in all 4 extremities without any  drift Sensory: Sensation is reduced in the right arm and leg per patient Gait:  Deferred in acute setting   NIHSS total 4  (1-2 for inability to answer questions, 0-1 for sensory loss on the right, 1 for mild to moderate aphasia, 1 for mild neglect of the right side, with fluctuating exam potentially secondary to attention/concentration/anxiety versus difficulty due to her aphasia)   I have reviewed labs in epic and the results pertinent to this consultation are:  Basic Metabolic Panel: Recent Labs  Lab 03/17/23 1300 03/17/23 1301  NA 136 137  K 4.2 4.2  CL 103 104  CO2 20*  --   GLUCOSE 97 92  BUN 22 22  CREATININE 1.49* 1.50*  CALCIUM 9.5  --     CBC: Recent Labs  Lab 03/17/23 1300 03/17/23 1301  WBC 6.4  --   NEUTROABS 4.3  --   HGB 12.5 13.6  HCT 38.9 40.0  MCV 95.3  --   PLT 337  --     Coagulation Studies: Recent Labs    03/17/23 1300  LABPROT 14.1  INR 1.1      I have reviewed the images obtained:  Head CT personally reviewed, no acute intracranial process  MRI brain reviewed, no acute stroke on my read, full sequences and radiology report pending   Impression: History is provided by caregiver most concerning for vasovagal syncope although given patient's symptoms and at request of daughter stroke was ruled out with emergent MRI  Emergent recommendations:  -Given borderline renal function, and overall impression that patient was at/near baseline, felt that CTA head and neck risk did not outweigh benefit; MRA head obtained -Emergent MRI obtained to rule out acute intracranial process  Recommendations: -Further workup for syncope per ED team -Agree with outpatient providers recommendations for assisted living facility -Given negative MRI brain, neurology will sign off at this time but will be available if additional questions or concerns arise please do not hesitate to reach out -Recommendations conveyed to ED provider via secure  chat   Brooke Dare MD-PhD [facility] [phone] Available 7 AM to 7 PM, outside these hours please contact Neurologist on call listed on AMION   Total critical care time: 70 minutes   Critical care time was exclusive of separately billable procedures and treating other patients.   Critical care was necessary to treat or prevent imminent or life-threatening deterioration.   Critical care was time spent personally by me on the following activities:  development of treatment plan with patient and/or surrogate as well as nursing, discussions with consultants/primary team, evaluation of patient's response to treatment, examination of patient, obtaining history from patient or surrogate, ordering and performing treatments and interventions, ordering and review of laboratory studies, ordering and review of radiographic studies, and re-evaluation of patient's condition as needed, as documented above.

## 2023-03-17 NOTE — ED Provider Notes (Signed)
[facility] EMERGENCY DEPARTMENT AT [facility] Provider Note   CSN: [id number] Arrival date & time: 03/17/23  1251  An emergency department physician performed an initial assessment on this suspected stroke patient at 1242.  History  Chief Complaint  Patient presents with   Code Stroke    [name] is a 82 y.o. 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  EMS reports that they were called out for fall and initiated code stroke status due to worsening aphasia and weakness.  Patient had a ground-level fall.  She was with her home Medical laboratory scientific officer.  She did not hit her head.  Her home health wound worker noted that she was more weak than usual and that she seemed to have more confusion and difficulty getting her words out.  No other complaints.  Patient has been seen in the ER for fall in the past. LKW 1125  HPI     Home Medications Prior to Admission medications   Medication Sig Start Date End Date Taking? Authorizing Provider  albuterol (VENTOLIN HFA) 108 (90 Base) MCG/ACT inhaler TAKE 2 PUFFS BY MOUTH EVERY 6 HOURS AS NEEDED FOR WHEEZE OR SHORTNESS OF BREATH 12/10/20   Parrett, Virgel Bouquet, NP  alendronate (FOSAMAX) 70 MG tablet     [provider]  amLODipine (NORVASC) 10 MG tablet Take 1 tablet (10 mg total) by mouth every morning. 10/09/21   Love, Evlyn Kanner, PA-C  amLODipine (NORVASC) 5 MG tablet 5 mg by oral route. Patient not taking: Reported on 02/10/2023    [provider]  Ascorbic Acid (VITAMIN C) 100 MG tablet Take 1 tablet (100 mg total) by mouth daily. 10/09/21   Love, Evlyn Kanner, PA-C  aspirin EC 81 MG tablet Take 81 mg by mouth daily. Swallow whole.    [provider]  Besifloxacin HCl (BESIVANCE) 0.6 % SUSP     [provider]   budesonide-formoterol (SYMBICORT) 80-4.5 MCG/ACT inhaler TAKE 2 PUFFS BY MOUTH TWICE A DAY    [provider]  Calcium-Magnesium-Vitamin D (CALCIUM 1200+D3 PO) Take 1 tablet by mouth daily.     [provider]  chlorpheniramine (CHLOR-TRIMETON) 4 MG tablet     [provider]  Cholecalciferol (VITAMIN D3 PO) Take 1 capsule by mouth daily.    [provider]  dorzolamide-timolol (COSOPT) 22.3-6.8 MG/ML ophthalmic solution Place 1 drop into both eyes 2 (two) times daily.    [provider]  famotidine (PEPCID) 20 MG tablet 1 tablet at bedtime as needed    [provider]  fenofibrate 160 MG tablet Take 160 mg by mouth daily. 08/16/21   [provider]  FLUoxetine (PROZAC) 40 MG capsule 1 capsule    [provider]  gabapentin (NEURONTIN) 100 MG capsule Take 1 capsule (100 mg total) by mouth at bedtime. One three times daily Patient not taking: Reported on 02/10/2023 08/17/22   Genice Rouge, MD  losartan (COZAAR) 100 MG tablet     [provider]  montelukast (SINGULAIR) 10 MG tablet Take 1 tablet (10 mg total) by mouth at bedtime. 03/28/14   Nyoka Cowden, MD  Multiple Vitamins-Minerals (CENTRUM SILVER PO) Take 1 tablet by mouth daily.    [provider]  solifenacin (VESICARE) 10 MG tablet Take 10 mg by mouth daily.  [provider]  traZODone (DESYREL) 50 MG tablet Take 1 tablet (50 mg total) by mouth at bedtime. 08/17/22   Lovorn, Aundra Millet, MD      Allergies    Fosamax [alendronate], Grass extracts [gramineae pollens], Nsaids, Other, and Prednisone    Review of Systems   Review of Systems  Physical Exam Updated Vital Signs BP (!) 142/88   Pulse 71   Resp 18   Ht 5\' 4"  (1.626 m)   Wt 73.4 kg   SpO2 100%   BMI 27.78 kg/m  Physical Exam Vitals and nursing note reviewed.  Constitutional:      General: She is not in acute distress.    Appearance: She is well-developed. She is not  diaphoretic.  HENT:     Head: Normocephalic and atraumatic.     Right Ear: External ear normal.     Left Ear: External ear normal.     Nose: Nose normal.     Mouth/Throat:     Mouth: Mucous membranes are moist.  Eyes:     General: No scleral icterus.    Conjunctiva/sclera: Conjunctivae normal.  Cardiovascular:     Rate and Rhythm: Normal rate and regular rhythm.     Heart sounds: Normal heart sounds. No murmur heard.    No friction rub. No gallop.  Pulmonary:     Effort: Pulmonary effort is normal. No respiratory distress.     Breath sounds: Normal breath sounds.  Abdominal:     General: Bowel sounds are normal. There is no distension.     Palpations: Abdomen is soft. There is no mass.     Tenderness: There is no abdominal tenderness. There is no guarding.  Musculoskeletal:     Cervical back: Normal range of motion.  Skin:    General: Skin is warm and dry.  Neurological:     Mental Status: She is alert and oriented to person, place, and time.     Comments: Aphasia with word finding difficulty.  Speech is otherwise normal. Please see full neuro note by Dr. Iver Nestle Right-sided weakness noted  Psychiatric:        Behavior: Behavior normal.     ED Results / Procedures / Treatments   Labs (all labs ordered are listed, but only abnormal results are displayed) Labs Reviewed  APTT - Abnormal; Notable for the following components:      Result Value   aPTT 23 (*)    All other components within normal limits  COMPREHENSIVE METABOLIC PANEL - Abnormal; Notable for the following components:   CO2 20 (*)    Creatinine, Ser 1.49 (*)    Albumin 3.4 (*)    Alkaline Phosphatase 35 (*)    GFR, Estimated 35 (*)    All other components within normal limits  I-STAT CHEM 8, ED - Abnormal; Notable for the following components:   Creatinine, Ser 1.50 (*)    Calcium, Ion 1.14 (*)    TCO2 21 (*)    All other components within normal limits  ETHANOL  PROTIME-INR  CBC  DIFFERENTIAL   RAPID URINE DRUG SCREEN, HOSP PERFORMED  URINALYSIS, ROUTINE W REFLEX MICROSCOPIC  CBG MONITORING, ED  TROPONIN I (HIGH SENSITIVITY)    EKG EKG Interpretation Date/Time:  Wednesday March 17 2023 14:04:32 EDT Ventricular Rate:  69 PR Interval:  158 QRS Duration:  91 QT Interval:  391 QTC Calculation: 419 R Axis:   73  Text Interpretation: Sinus rhythm No significant change since last tracing Confirmed by Anitra Lauth,  Alphonzo Lemmings ([id number]) on 03/17/2023 2:25:30 PM  Radiology MR BRAIN WO CONTRAST  Result Date: 03/17/2023 CLINICAL DATA:  Provided history: Neuro deficit, acute, stroke suspected. EXAM: MRI HEAD WITHOUT CONTRAST MRA HEAD WITHOUT CONTRAST TECHNIQUE: Multiplanar, multi-echo pulse sequences of the brain and surrounding structures were acquired without intravenous contrast. Angiographic images of the Circle of Willis were acquired using MRA technique without intravenous contrast. COMPARISON:  Non-contrast head CT 03/17/2023. CT angiogram head/neck 09/21/2021. Brain MRI 09/21/2021. FINDINGS: MRI HEAD FINDINGS Brain: Mild generalized cerebral atrophy. Known chronic cortical/subcortical left MCA territory infarct within the posterior left frontal lobe/insula and left parietal lobe. Chronic hemosiderin deposition and cortical laminar necrosis within portions of this infarction territory. Redemonstrated small chronic cortical infarct within the right occipital lobe (PCA territory). Chronic lacunar infarcts within the bilateral deep gray nuclei. Notably, a chronic lacunar infarct within the ventral right thalamus is new from the prior brain MRI of 09/21/2021. Background chronic small vessel ischemic changes which are mild in the cerebral white matter, and moderate in the pons. 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 Punctate chronic microhemorrhages within the bilateral occipital lobes. Known small chronic infarcts within the bilateral cerebellar hemispheres.  There is no acute infarct. No evidence of an intracranial mass. No extra-axial fluid collection. No midline shift. Vascular: Maintained flow voids within the proximal large arterial vessels. Skull and upper cervical spine: No focal suspicious marrow lesion. Incompletely assessed cervical spondylosis. C3-C4 grade 1 anterolisthesis. Sinuses/Orbits: No mass or acute finding within the imaged orbits. No significant paranasal sinus disease. MRA HEAD FINDINGS Anterior circulation: The intracranial internal carotid arteries are patent. Atherosclerotic plaque within both vessels with no more than mild stenosis. The M1 middle cerebral arteries are patent. No M2 proximal branch occlusion is identified. The anterior cerebral arteries are patent. Hypoplastic right A1 segment. 2 mm inferiorly projecting vascular protrusions arising from the cavernous internal carotid arteries, bilaterally (for instance as seen on series 650, images 5 and 6). These may reflect aneurysms or the origins of otherwise poorly delineated branch vessels. These findings were not appreciated on the prior CTA head/neck of 09/21/2021. Posterior circulation: The intracranial vertebral arteries are patent. The basilar artery is patent. The posterior cerebral arteries are patent. Posterior communicating arteries are diminutive or absent, bilaterally. Anatomic variants: As described IMPRESSION: MRI brain: 1. No evidence of an acute intracranial abnormality. 2. Parenchymal atrophy, chronic small vessel ischemic disease and chronic infarcts, as described. Of note, a chronic lacunar infarct within the ventral right thalamus is new from the prior brain MRI of 09/21/2021, and pontine chronic small vessel ischemic disease has progressed. MRA head: 1. No intracranial large vessel occlusion or proximal high-grade arterial stenosis identified. 2. Atherosclerotic plaque within the intracranial ICAs with no more than mild stenosis. 3. 2 mm inferiorly projecting vascular  protrusions arising from the cavernous internal carotid arteries, bilaterally, which may reflect aneurysms or the origins of otherwise poorly delineated branch vessels. Electronically Signed   By: Jackey Loge D.O.   On: 03/17/2023 14:53   MR ANGIO HEAD WO CONTRAST  Result Date: 03/17/2023 CLINICAL DATA:  Provided history: Neuro deficit, acute, stroke suspected. EXAM: MRI HEAD WITHOUT CONTRAST MRA HEAD WITHOUT CONTRAST TECHNIQUE: Multiplanar, multi-echo pulse sequences of the brain and surrounding structures were acquired without intravenous contrast. Angiographic images of the Circle of Willis were acquired using MRA technique without intravenous contrast. COMPARISON:  Non-contrast head CT 03/17/2023. CT angiogram head/neck 09/21/2021. Brain MRI 09/21/2021. FINDINGS: MRI HEAD FINDINGS Brain: Mild generalized  cerebral atrophy. Known chronic cortical/subcortical left MCA territory infarct within the posterior left frontal lobe/insula and left parietal lobe. Chronic hemosiderin deposition and cortical laminar necrosis within portions of this infarction territory. Redemonstrated small chronic cortical infarct within the right occipital lobe (PCA territory). Chronic lacunar infarcts within the bilateral deep gray nuclei. Notably, a chronic lacunar infarct within the ventral right thalamus is new from the prior brain MRI of 09/21/2021. Background chronic small vessel ischemic changes which are mild in the cerebral white matter, and moderate in the pons. 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 Punctate chronic microhemorrhages within the bilateral occipital lobes. Known small chronic infarcts within the bilateral cerebellar hemispheres. There is no acute infarct. No evidence of an intracranial mass. No extra-axial fluid collection. No midline shift. Vascular: Maintained flow voids within the proximal large arterial vessels. Skull and upper cervical spine: No focal suspicious  marrow lesion. Incompletely assessed cervical spondylosis. C3-C4 grade 1 anterolisthesis. Sinuses/Orbits: No mass or acute finding within the imaged orbits. No significant paranasal sinus disease. MRA HEAD FINDINGS Anterior circulation: The intracranial internal carotid arteries are patent. Atherosclerotic plaque within both vessels with no more than mild stenosis. The M1 middle cerebral arteries are patent. No M2 proximal branch occlusion is identified. The anterior cerebral arteries are patent. Hypoplastic right A1 segment. 2 mm inferiorly projecting vascular protrusions arising from the cavernous internal carotid arteries, bilaterally (for instance as seen on series 650, images 5 and 6). These may reflect aneurysms or the origins of otherwise poorly delineated branch vessels. These findings were not appreciated on the prior CTA head/neck of 09/21/2021. Posterior circulation: The intracranial vertebral arteries are patent. The basilar artery is patent. The posterior cerebral arteries are patent. Posterior communicating arteries are diminutive or absent, bilaterally. Anatomic variants: As described IMPRESSION: MRI brain: 1. No evidence of an acute intracranial abnormality. 2. Parenchymal atrophy, chronic small vessel ischemic disease and chronic infarcts, as described. Of note, a chronic lacunar infarct within the ventral right thalamus is new from the prior brain MRI of 09/21/2021, and pontine chronic small vessel ischemic disease has progressed. MRA head: 1. No intracranial large vessel occlusion or proximal high-grade arterial stenosis identified. 2. Atherosclerotic plaque within the intracranial ICAs with no more than mild stenosis. 3. 2 mm inferiorly projecting vascular protrusions arising from the cavernous internal carotid arteries, bilaterally, which may reflect aneurysms or the origins of otherwise poorly delineated branch vessels. Electronically Signed   By: Jackey Loge D.O.   On: 03/17/2023 14:53   CT  HEAD CODE STROKE WO CONTRAST  Result Date: 03/17/2023 CLINICAL DATA:  Code stroke. Neuro deficit, acute, stroke suspected. Confused, aphasia and left leg weakness. EXAM: CT HEAD WITHOUT CONTRAST TECHNIQUE: Contiguous axial images were obtained from the base of the skull through the vertex without intravenous contrast. RADIATION DOSE REDUCTION: This exam was performed according to the departmental dose-optimization program which includes automated exposure control, adjustment of the mA and/or kV according to patient size and/or use of iterative reconstruction technique. COMPARISON:  Head CT 11/01/2022. FINDINGS: Brain: No acute intracranial hemorrhage. Unchanged encephalomalacia in the left posterior MCA territory and old lacunar infarcts in the left caudate and right thalamus. No hydrocephalus or extra-axial collection. No mass effect or midline shift. Vascular: No hyperdense vessel or unexpected calcification. Skull: No calvarial fracture or suspicious bone lesion. Skull base is unremarkable. Sinuses/Orbits: No acute finding. Other: None. ASPECTS [facility] Stroke Program Early CT Score) - Ganglionic level infarction (caudate, lentiform nuclei, internal capsule,  insula, M1-M3 cortex): 7 - Supraganglionic infarction (M4-M6 cortex): 3 Total score (0-10 with 10 being normal): 10 IMPRESSION: 1. No acute intracranial hemorrhage or evidence of acute large vessel territory infarct. ASPECT score is 10. 2. Unchanged encephalomalacia in the left posterior MCA territory and old lacunar infarcts in the left caudate and right thalamus. Code stroke imaging results were communicated on 03/17/2023 at 1:12 pm to provider Dr. Iver Nestle Via secure text paging. Electronically Signed   By: Orvan Falconer M.D.   On: 03/17/2023 13:12    Procedures Procedures    Medications Ordered in ED Medications - No data to display  ED Course/ Medical Decision Making/ A&P Clinical Course as of 03/17/23 1646  Wed Mar 17, 2023  1459  Creatinine(!): 1.49 Renal insufficiency appears to be at baseline [AH]  1459 Ethanol [AH]  1459 CBC [AH]  1459 CT HEAD CODE STROKE WO CONTRAST [AH]  1459 MR ANGIO HEAD WO CONTRAST [AH]  1459 MR BRAIN WO CONTRAST I visualized and interpreted CT head MR and MRA of the brain.  No acute strokes noted [AH]  1500 EKG 12-Lead EKG shows sinus rhythm at a rate of 69 without significant change from previous tracing [AH]  1634 Ethanol [AH]  1634 Troponin I (High Sensitivity) [AH]  1634 CBC [AH]  1634 Comprehensive metabolic panel(!) [AH]  1635 MR BRAIN WO CONTRAST [AH]  1635 MR ANGIO HEAD WO CONTRAST [AH]    Clinical Course User Index [AH] Arthor Captain, PA-C                                 Medical Decision Making For that here for fall/weakness.  Per phone consultation with the patient's daughter and discussion with the neurologist, Dr. Iver Nestle concern is now for near syncope. She appears to be negative for acute stroke. The differential for syncope is extensive and includes, but is not limited to: arrythmia (Vtach, SVT, SSS, sinus arrest, AV block, bradycardia) aortic stenosis, AMI, HOCM, PE, atrial myxoma, pulmonary hypertension, orthostatic hypotension, (hypovolemia, drug effect, GB syndrome, micturition, cough, swall) carotid sinus sensitivity, Seizure, TIA/CVA, hypoglycemia,  Vertigo.   I discussed symptoms with the patient's family at bedside.  Patient had just gotten out of a very hot shower.  Her caretaker states that she felt very exhausted which is common after hot shower so she laid down for about 30 minutes.  She called out to tell her that she was cold and wanted to wear a jacket.  Patient's caretaker states that she was standing and holding onto the dresser and began coughing.  Her legs went weak and then she fell to the ground.  Her caretaker says that it happened so quickly she could not get to her fast enough to help her.  The patient denies loss of consciousness.   I ordered  and reviewed labs as per ED course.  Patient has negative troponin.  And I doubt cardiac cause of the events.  I suspect patient likely had recrudescence of symptoms and vasovagal syncope in the setting of hot shower and coughing.    Patient is back to baseline and her aphasia is minimal at this point.  I discussed all findings with the patient and her family at bedside.  I have answered all questions to the best of my ability.  I called and spoke with the patient's daughter Lamar Laundry by phone.  Patient's caretaker will spend the night with her to make sure  she is doing okay.  Patient appears appropriate for discharge at this time  Amount and/or Complexity of Data Reviewed Labs: ordered. Decision-making details documented in ED Course. Radiology: ordered and independent interpretation performed. ECG/medicine tests: ordered and independent interpretation performed.           Final Clinical Impression(s) / ED Diagnoses Final diagnoses:  Vasovagal near syncope    Rx / DC Orders ED Discharge Orders     None         Arthor Captain, PA-C 03/17/23 1657    Gwyneth Sprout, MD 03/19/23 1609

## 2023-03-17 NOTE — ED Notes (Signed)
Pt alert and oriented family at  the bedside

## 2023-03-17 NOTE — Discharge Instructions (Addendum)
Get help right away if: You faint. You have any of these symptoms that may indicate trouble with your heart: Fast or irregular heartbeats (palpitations). Unusual pain in your chest, abdomen, or back. Shortness of breath. You have a seizure. You have a severe headache. You are confused. You have vision problems. You have severe weakness or trouble walking. You are bleeding from your mouth or rectum, or have black or tarry stool. These symptoms may represent a serious problem that is an emergency. Do not wait to see if your symptoms will go away. Get medical help right away. Call your local emergency services (911 in the [place] Do not drive yourself to the hospital.

## 2023-03-18 DIAGNOSIS — R55 Syncope and collapse: Secondary | ICD-10-CM | POA: Diagnosis not present

## 2023-04-28 ENCOUNTER — Encounter (INDEPENDENT_AMBULATORY_CARE_PROVIDER_SITE_OTHER): Payer: Medicare Other | Admitting: Ophthalmology

## 2023-04-28 DIAGNOSIS — H35033 Hypertensive retinopathy, bilateral: Secondary | ICD-10-CM

## 2023-04-28 DIAGNOSIS — H34831 Tributary (branch) retinal vein occlusion, right eye, with macular edema: Secondary | ICD-10-CM | POA: Diagnosis not present

## 2023-04-28 DIAGNOSIS — I10 Essential (primary) hypertension: Secondary | ICD-10-CM | POA: Diagnosis not present

## 2023-04-28 DIAGNOSIS — H353132 Nonexudative age-related macular degeneration, bilateral, intermediate dry stage: Secondary | ICD-10-CM

## 2023-04-28 DIAGNOSIS — H43813 Vitreous degeneration, bilateral: Secondary | ICD-10-CM | POA: Diagnosis not present

## 2023-05-04 ENCOUNTER — Ambulatory Visit
Admit: 2023-05-04 | Discharge: 2023-05-04 | Disposition: A | Discharge status: Home / Self Care | Payer: Medicare Other | Attending: Thoracic Surgery (Cardiothoracic Vascular Surgery) | Admitting: Thoracic Surgery (Cardiothoracic Vascular Surgery)

## 2023-05-04 ENCOUNTER — Ambulatory Visit: Payer: Medicare Other | Admitting: Thoracic Surgery (Cardiothoracic Vascular Surgery)

## 2023-05-04 ENCOUNTER — Encounter: Payer: Self-pay | Admitting: Thoracic Surgery (Cardiothoracic Vascular Surgery)

## 2023-05-04 VITALS — BP 137/84 | HR 95 | Resp 20 | Wt 160.0 lb

## 2023-05-04 DIAGNOSIS — Z85118 Personal history of other malignant neoplasm of bronchus and lung: Secondary | ICD-10-CM | POA: Diagnosis not present

## 2023-05-04 DIAGNOSIS — R918 Other nonspecific abnormal finding of lung field: Secondary | ICD-10-CM | POA: Diagnosis not present

## 2023-05-04 DIAGNOSIS — I700 Atherosclerosis of aorta: Secondary | ICD-10-CM | POA: Diagnosis not present

## 2023-05-04 NOTE — Progress Notes (Signed)
[address]       [place] [address]             [phone]    HPI: [name] returns for scheduled follow-up of multiple lung nodules.  [name] is an 82 year old woman with a history of remote tobacco use, stage I adenocarcinoma of the lung, right lower lobectomy, multiple lung nodules, asthma, depression, anxiety, reflux, arthritis, obesity, and strokes.  She underwent a right lower lobectomy for stage Ia adenocarcinoma in 2015.  She has been followed since then with multiple groundglass opacities and stable small solid nodules.  I last saw her in October 2023.  There were stable findings.  In September she had a fall and there was concern for a stroke.  She went to the emergency room but workup did not show any new events.    Past Medical History:  Diagnosis Date    Multiple pulmonary nodules, largest R mid lung 06/29/2013   Followed in Pulmonary clinic/ [facility]/ [facility] - see CXR 06/27/13  - CT chest 07/14/2013 > 1. No acute findings in the thorax to account for the patient's  symptoms.  2. However, there is a subsolid nodule in the   right lower lobe that has a ground-glass attenuation component  measuring 2.5 x 1.8 cm, and a small central solid component  measuring 4 mm. Initial follow-up by chest CT without    Adenocarcinoma of lung, stage 1 (HCC)    Status post right lower lobectomy August 2015   Anxiety    Asthma    Atrophic vaginitis    Chronic cough 12/20/2018   Chronic iritis of left eye    Pupil stays dilated; nonreactive   Chronic kidney disease, stage 3b (HCC)    Colon polyp    Contact lens/glasses fitting    wears contacts or glasses   Cough variant asthma with component of uacs  05/31/2013   Followed in Pulmonary clinic/ [facility]/ [facility] in her 40's - Spirometry 02/23/13 wnl  - 07/03/2013 Sinus CT > Mild chronic sinus disease - No acute findings. Left-to-right nasal septal deviation of 3 mm. -med calendar  08/08/13 , redone 06/01/2016 and 02/22/2017  - Eos 4%   10/13/2013  > rec singulair daily  - FENO 05/12/2016  =   24 on singulair  - Allergy profile 05/12/2016 >   IgE  47 pos   Depression    Depression with anxiety    Dysuria 08/03/2019   Environmental allergies    Essential hypertension 02/23/2017   Changed losartan to avapro 02/22/2017 due to cough    GERD (gastroesophageal reflux disease)    Hypertension    Hypertriglyceridemia    Kidney stones    OA (osteoarthritis)    Osteopenia    PONV (postoperative nausea and vomiting)    S/P lobectomy of lung 02/12/2014   Status post total knee replacement, left 08/26/2018   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    Current Outpatient Medications  Medication Sig Dispense Refill   albuterol (VENTOLIN HFA) 108 (90 Base) MCG/ACT inhaler TAKE 2 PUFFS BY MOUTH EVERY 6 HOURS AS NEEDED FOR WHEEZE OR SHORTNESS OF BREATH 6.7 each 5   alendronate (FOSAMAX) 70 MG tablet      amLODipine (NORVASC) 10 MG tablet Take 1 tablet (10 mg total) by mouth every morning. 10 tablet 0   amLODipine (NORVASC) 5 MG tablet      Ascorbic Acid (VITAMIN C) 100 MG tablet Take  1 tablet (100 mg total) by mouth daily. 30 tablet 0   aspirin EC 81 MG tablet Take 81 mg by mouth daily. Swallow whole.     Besifloxacin HCl (BESIVANCE) 0.6 % SUSP      budesonide-formoterol (SYMBICORT) 80-4.5 MCG/ACT inhaler TAKE 2 PUFFS BY MOUTH TWICE A DAY     Calcium-Magnesium-Vitamin D (CALCIUM 1200+D3 PO) Take 1 tablet by mouth daily.      chlorpheniramine (CHLOR-TRIMETON) 4 MG tablet      Cholecalciferol (VITAMIN D3 PO) Take 1 capsule by mouth daily.     dorzolamide-timolol (COSOPT) 22.3-6.8 MG/ML ophthalmic solution Place 1 drop into both eyes 2 (two) times daily.     famotidine (PEPCID) 20 MG tablet 1 tablet at bedtime as needed     fenofibrate 160 MG tablet Take 160 mg by mouth daily.     FLUoxetine (PROZAC) 40 MG capsule 1 capsule     gabapentin (NEURONTIN) 100 MG capsule Take 1 capsule (100 mg  total) by mouth at bedtime. One three times daily 90 capsule 1   losartan (COZAAR) 100 MG tablet      montelukast (SINGULAIR) 10 MG tablet Take 1 tablet (10 mg total) by mouth at bedtime. 90 tablet 0   Multiple Vitamins-Minerals (CENTRUM SILVER PO) Take 1 tablet by mouth daily.     solifenacin (VESICARE) 10 MG tablet Take 10 mg by mouth daily.     traZODone (DESYREL) 50 MG tablet Take 1 tablet (50 mg total) by mouth at bedtime. 90 tablet 1   No current facility-administered medications for this visit.    Physical Exam Vitals reviewed.  Constitutional:      General: She is not in acute distress. Eyes:     General: No scleral icterus.    Extraocular Movements: Extraocular movements intact.  Cardiovascular:     Rate and Rhythm: Normal rate and regular rhythm.     Heart sounds: Normal heart sounds. No murmur heard. Pulmonary:     Effort: Pulmonary effort is normal. No respiratory distress.     Breath sounds: Normal breath sounds. No wheezing.  Abdominal:     Palpations: Abdomen is soft.  Musculoskeletal:     Cervical back: Neck supple. No rigidity.  Lymphadenopathy:     Cervical: No cervical adenopathy.  Skin:    General: Skin is warm and dry.  Neurological:     Mental Status: She is alert and oriented to person, place, and time.     Cranial Nerves: No cranial nerve deficit.    Diagnostic Tests: I have personally reviewed the CT images and compared them to her scan from October 2023.  I see no significant interval change.  Await official radiology interpretation.  Impression: [name] is an 82 year old woman with a history of remote tobacco use, stage I adenocarcinoma of the lung, right lower lobectomy, multiple lung nodules, asthma, depression, anxiety, reflux, arthritis, obesity, and strokes.  Stage I adenocarcinoma of the lung, status post right lower lobectomy 9 years ago.  Has multiple groundglass and solid nodules.  Most concerning one has been in the right upper  lobe.  I see no significant change but await the formal radiology reading.  She would not be a candidate for surgical intervention but could be treated with stereotactic radiation should the need arise.  Plan: Await official radiology reading of CT Will plan to see her back in a year with a CT of the chest, unless radiology report indicates need for earlier follow-up.  I spent over 20  minutes in review of records, images, and in consultation with [name] today. Loreli Slot, MD [facility] [phone]

## 2023-07-14 ENCOUNTER — Encounter (INDEPENDENT_AMBULATORY_CARE_PROVIDER_SITE_OTHER): Payer: Medicare Other | Admitting: Ophthalmology

## 2023-07-14 DIAGNOSIS — H35033 Hypertensive retinopathy, bilateral: Secondary | ICD-10-CM | POA: Diagnosis not present

## 2023-07-14 DIAGNOSIS — H43813 Vitreous degeneration, bilateral: Secondary | ICD-10-CM | POA: Diagnosis not present

## 2023-07-14 DIAGNOSIS — H34831 Tributary (branch) retinal vein occlusion, right eye, with macular edema: Secondary | ICD-10-CM | POA: Diagnosis not present

## 2023-07-14 DIAGNOSIS — H353132 Nonexudative age-related macular degeneration, bilateral, intermediate dry stage: Secondary | ICD-10-CM

## 2023-07-14 DIAGNOSIS — I10 Essential (primary) hypertension: Secondary | ICD-10-CM

## 2023-08-03 DIAGNOSIS — Z79899 Other long term (current) drug therapy: Secondary | ICD-10-CM | POA: Diagnosis not present

## 2023-08-03 DIAGNOSIS — I10 Essential (primary) hypertension: Secondary | ICD-10-CM | POA: Diagnosis not present

## 2023-08-03 DIAGNOSIS — H3530 Unspecified macular degeneration: Secondary | ICD-10-CM | POA: Diagnosis not present

## 2023-08-03 DIAGNOSIS — J4530 Mild persistent asthma, uncomplicated: Secondary | ICD-10-CM | POA: Diagnosis not present

## 2023-08-03 DIAGNOSIS — M8580 Other specified disorders of bone density and structure, unspecified site: Secondary | ICD-10-CM | POA: Diagnosis not present

## 2023-08-03 DIAGNOSIS — E7800 Pure hypercholesterolemia, unspecified: Secondary | ICD-10-CM | POA: Diagnosis not present

## 2023-08-03 DIAGNOSIS — I700 Atherosclerosis of aorta: Secondary | ICD-10-CM | POA: Diagnosis not present

## 2023-08-03 DIAGNOSIS — N1832 Chronic kidney disease, stage 3b: Secondary | ICD-10-CM | POA: Diagnosis not present

## 2023-08-03 DIAGNOSIS — Z23 Encounter for immunization: Secondary | ICD-10-CM | POA: Diagnosis not present

## 2023-08-03 DIAGNOSIS — R7301 Impaired fasting glucose: Secondary | ICD-10-CM | POA: Diagnosis not present

## 2023-08-03 DIAGNOSIS — Z0000 Encounter for general adult medical examination without abnormal findings: Secondary | ICD-10-CM | POA: Diagnosis not present

## 2023-08-04 ENCOUNTER — Other Ambulatory Visit: Payer: Self-pay | Admitting: Family Medicine

## 2023-08-04 DIAGNOSIS — M8580 Other specified disorders of bone density and structure, unspecified site: Secondary | ICD-10-CM

## 2023-08-16 ENCOUNTER — Encounter
Payer: Medicare Other | Attending: Physical Medicine and Rehabilitation | Admitting: Physical Medicine and Rehabilitation

## 2023-08-16 ENCOUNTER — Encounter: Payer: Self-pay | Admitting: Physical Medicine and Rehabilitation

## 2023-08-16 VITALS — BP 145/89 | HR 88 | Ht 64.0 in | Wt 154.0 lb

## 2023-08-16 DIAGNOSIS — M6281 Muscle weakness (generalized): Secondary | ICD-10-CM | POA: Diagnosis not present

## 2023-08-16 DIAGNOSIS — Z8673 Personal history of transient ischemic attack (TIA), and cerebral infarction without residual deficits: Secondary | ICD-10-CM | POA: Insufficient documentation

## 2023-08-16 DIAGNOSIS — I69320 Aphasia following cerebral infarction: Secondary | ICD-10-CM | POA: Insufficient documentation

## 2023-08-16 DIAGNOSIS — R269 Unspecified abnormalities of gait and mobility: Secondary | ICD-10-CM | POA: Insufficient documentation

## 2023-08-16 NOTE — Patient Instructions (Addendum)
 Pt is an 83 yr old female with hx of L MCA stroke in 3/23, HTN, CKD3a; hx of lung CA stage I; Asthma; anxiety with depression, hyperactive bladder; Glaucoma. Here for  f/u for L MCA stroke.  Hx of L TKR  Need to do foot taps- to fly cross country to grandson's wedding. .  5 minutes every 1 hour. Literature shows that flights more than 2 hours straight increase your risk of a clot/DVT. Good for everyone, actually.   2.   Needs to take Rolator- when flies- so take everything off the walker and and way to secure it folding, that's best- bungee cord, etc.    3. [facility] is the best for patients with disabilities.    4. No driving.  Cannot drive at all- for at least another year- I don't see her driving again due to lack of response time.    5.    F/U as needed-   6. Please use your Rolator at all times!

## 2023-08-16 NOTE — Progress Notes (Signed)
 Subjective:    Patient ID: [name], female    DOB: 22-Jan-1941, 83 y.o.   MRN: [id number]  HPI  Pt is an 83 yr old female with hx of L MCA stroke in 3/23, HTN, CKD3a; hx of lung CA stage I; Asthma; anxiety with depression, hyperactive bladder; Glaucoma. Here for  f/u for L MCA stroke.  Hx of L TKR    Toured the ALF near daughter's house-   It was OK if "she had to go somewhere"= She's on the wait list   They don't know how much longer for larger apartment- but has one that's smaller that's available right now.   BR and Bathroom same size, but pantry, the walk in closet both larger in larger model and sitting area. .    No issues right now per pt.  STM continues to be an issue- more so than 6 months ago.  Not getting stimulation-  a friend has noticed difference as well.    Not sure  she's go to breakfast.   Per pt.    Walks without Rolator at home-  has fallen since last saw me- last fall- at least 1 if not 2. No injuries except bruising.    Pain Inventory Average Pain 0 Pain Right Now 0 My pain is  no pain   In the last 24 hours, has pain interfered with the following? General activity 0 Relation with others 0 Enjoyment of life 0  Sleep (in general) Good  Pain is worse with:  no pain  Pain improves with:  no pain today    Family History  Problem Relation Age of Onset   Stroke Father    Asthma Father    Heart disease Father        CABG x 2   Lung cancer Sister        smoked   Breast cancer Daughter 21   Social History   Socioeconomic History   Marital status: Widowed    Spouse name: Not on file   Number of children: 2   Years of education: Not on file   Highest education level: Not on file  Occupational History   Occupation: IT trainer firm- retired  Tobacco Use   Smoking status: Former    Current packs/day: 0.00    Average packs/day: 0.8 packs/day for 20.0 years (15.0 ttl pk-yrs)    Types: Cigarettes    Start date: 36    Quit date: 1980     Years since quitting: 45.1   Smokeless tobacco: Never  Vaping Use   Vaping status: Never Used  Substance and Sexual Activity   Alcohol use: Yes    Alcohol/week: 1.0 standard drink of alcohol    Types: 1 Glasses of wine per week    Comment: occasional   Drug use: No   Sexual activity: Not on file  Other Topics Concern   Not on file  Social History Narrative   ** Merged History Encounter **       Social Drivers of Corporate investment banker Strain: Not on file  Food Insecurity: Not on file  Transportation Needs: Not on file  Physical Activity: Not on file  Stress: Not on file  Social Connections: Not on file   Past Surgical History:  Procedure Laterality Date   APPENDECTOMY     COLONOSCOPY     EYE SURGERY Left    cataract removal   ORIF WRIST FRACTURE Left 08/04/2013   Procedure: OPEN REDUCTION  INTERNAL FIXATION (ORIF) LEFT DISTAL RADIUS WRIST FRACTURE;  Surgeon: Kemp Patter, MD;  Location: [facility];  Service: Orthopedics;  Laterality: Left;   PARTIAL HYSTERECTOMY     TOTAL KNEE ARTHROPLASTY Left 08/26/2018   Procedure: TOTAL KNEE ARTHROPLASTY;  Surgeon: Winston Hawking, MD;  Location: [facility];  Service: Orthopedics;  Laterality: Left;   VIDEO ASSISTED THORACOSCOPY (VATS)/WEDGE RESECTION Right 02/12/2014   Procedure: RIGHT VIDEO ASSISTED THORACOSCOPY RIGHT LOWER LOBE LUNG /WEDGE RESECTION,RIGHT THORACOTOMY WITH RIGHT LOWER LOBE LOBECTOMY & NODE DISSECTION;  Surgeon: Zelphia Higashi, MD;  Location: [facility];  Service: Thoracic;  Laterality: Right;   VIDEO ASSISTED THORACOSCOPY (VATS)/WEDGE RESECTION Right 02/12/2014   VIDEO BRONCHOSCOPY N/A 02/12/2014   Procedure: VIDEO BRONCHOSCOPY;  Surgeon: Zelphia Higashi, MD;  Location: [facility] OR;  Service: Thoracic;  Laterality: N/A;   Past Surgical History:  Procedure Laterality Date   APPENDECTOMY     COLONOSCOPY     EYE SURGERY Left    cataract removal   ORIF WRIST FRACTURE Left 08/04/2013   Procedure: OPEN  REDUCTION INTERNAL FIXATION (ORIF) LEFT DISTAL RADIUS WRIST FRACTURE;  Surgeon: Kemp Patter, MD;  Location: [facility];  Service: Orthopedics;  Laterality: Left;   PARTIAL HYSTERECTOMY     TOTAL KNEE ARTHROPLASTY Left 08/26/2018   Procedure: TOTAL KNEE ARTHROPLASTY;  Surgeon: Winston Hawking, MD;  Location: [facility];  Service: Orthopedics;  Laterality: Left;   VIDEO ASSISTED THORACOSCOPY (VATS)/WEDGE RESECTION Right 02/12/2014   Procedure: RIGHT VIDEO ASSISTED THORACOSCOPY RIGHT LOWER LOBE LUNG /WEDGE RESECTION,RIGHT THORACOTOMY WITH RIGHT LOWER LOBE LOBECTOMY & NODE DISSECTION;  Surgeon: Zelphia Higashi, MD;  Location: [facility];  Service: Thoracic;  Laterality: Right;   VIDEO ASSISTED THORACOSCOPY (VATS)/WEDGE RESECTION Right 02/12/2014   VIDEO BRONCHOSCOPY N/A 02/12/2014   Procedure: VIDEO BRONCHOSCOPY;  Surgeon: Zelphia Higashi, MD;  Location: [facility] OR;  Service: Thoracic;  Laterality: N/A;   Past Medical History:  Diagnosis Date    Multiple pulmonary nodules, largest R mid lung 06/29/2013   Followed in Pulmonary clinic/ [facility]/ [place] - see CXR 06/27/13  - CT chest 07/14/2013 > 1. No acute findings in the thorax to account for the patient's  symptoms.  2. However, there is a subsolid nodule in the   right lower lobe that has a ground-glass attenuation component  measuring 2.5 x 1.8 cm, and a small central solid component  measuring 4 mm. Initial follow-up by chest CT without    Adenocarcinoma of lung, stage 1 (HCC)    Status post right lower lobectomy August 2015   Anxiety    Asthma    Atrophic vaginitis    Chronic cough 12/20/2018   Chronic iritis of left eye    Pupil stays dilated; nonreactive   Chronic kidney disease, stage 3b (HCC)    Colon polyp    Contact lens/glasses fitting    wears contacts or glasses   Cough variant asthma with component of uacs  05/31/2013   Followed in Pulmonary clinic/ [facility]/ [facility] in her 40's - Spirometry 02/23/13 wnl   - 07/03/2013 Sinus CT > Mild chronic sinus disease - No acute findings. Left-to-right nasal septal deviation of 3 mm. -med calendar 08/08/13 , redone 06/01/2016 and 02/22/2017  - Eos 4%   10/13/2013  > rec singulair  daily  - FENO 05/12/2016  =   24 on singulair   - Allergy  profile 05/12/2016 >   IgE  47 pos   Depression    Depression  with anxiety    Dysuria 08/03/2019   Environmental allergies    Essential hypertension 02/23/2017   Changed losartan  to avapro  02/22/2017 due to cough    GERD (gastroesophageal reflux disease)    Hypertension    Hypertriglyceridemia    Kidney stones    OA (osteoarthritis)    Osteopenia    PONV (postoperative nausea and vomiting)    S/P lobectomy of lung 02/12/2014   Status post total knee replacement, left 08/26/2018   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   Ht 5\' 4"  (1.626 m)   Wt 154 lb (69.9 kg)   BMI 26.43 kg/m   Opioid Risk Score:   Fall Risk Score:  `1  Depression screen [facility] 2/9     02/10/2023    9:09 AM 01/28/2022   11:29 AM 10/22/2021    3:18 PM  Depression screen PHQ 2/9  Decreased Interest 1 1 0  Down, Depressed, Hopeless 1 1 0  PHQ - 2 Score 2 2 0  Altered sleeping   0  Tired, decreased energy   1  Change in appetite   0  Feeling bad or failure about yourself    0  Trouble concentrating   3  Moving slowly or fidgety/restless   2  Suicidal thoughts   0  PHQ-9 Score   6  Difficult doing work/chores   Very difficult     Review of Systems  All other systems reviewed and are negative.     Objective:   Physical Exam  Awake, alert, using Rolator to walk, very little spontaneous speech, AD Mild aphasia- - expressive aphasia.  "At the facility".  Has let hair go grey      Assessment & Plan:   Pt is an 83 yr old female with hx of L MCA stroke in 3/23, HTN, CKD3a; hx of lung CA stage I; Asthma; anxiety with depression, hyperactive bladder; Glaucoma. Here for  f/u for L MCA stroke.  Hx of L TKR  Need to do foot taps- to fly cross  country to grandson's wedding. .  5 minutes every 1 hour. Literature shows that flights more than 2 hours straight increase your risk of a clot/DVT. Good for everyone, actually.   2.   Needs to take Rolator- when flies- so take everything off the walker and and way to secure it folding, that's best- bungee cord, etc.    3. [facility] is the best for patients with disabilities.    4. No driving.  Cannot drive at all- for at least another year- I don't see her driving again due to lack of response time.    5.    F/U as needed-    6. Please use your Rolator at all times!   I spent a total of 25   minutes on total care today- >50% coordination of care- due to having conversation of what to take to ALF- and going to ALF- no driving as well

## 2023-09-21 ENCOUNTER — Encounter (INDEPENDENT_AMBULATORY_CARE_PROVIDER_SITE_OTHER): Payer: Medicare Other | Admitting: Ophthalmology

## 2023-10-04 ENCOUNTER — Encounter (INDEPENDENT_AMBULATORY_CARE_PROVIDER_SITE_OTHER): Admitting: Ophthalmology

## 2023-10-13 ENCOUNTER — Encounter (INDEPENDENT_AMBULATORY_CARE_PROVIDER_SITE_OTHER): Admitting: Ophthalmology

## 2023-11-01 ENCOUNTER — Encounter (INDEPENDENT_AMBULATORY_CARE_PROVIDER_SITE_OTHER): Admitting: Ophthalmology

## 2023-11-01 DIAGNOSIS — H43813 Vitreous degeneration, bilateral: Secondary | ICD-10-CM

## 2023-11-01 DIAGNOSIS — H35033 Hypertensive retinopathy, bilateral: Secondary | ICD-10-CM | POA: Diagnosis not present

## 2023-11-01 DIAGNOSIS — H353132 Nonexudative age-related macular degeneration, bilateral, intermediate dry stage: Secondary | ICD-10-CM

## 2023-11-01 DIAGNOSIS — I10 Essential (primary) hypertension: Secondary | ICD-10-CM

## 2023-11-01 DIAGNOSIS — H34831 Tributary (branch) retinal vein occlusion, right eye, with macular edema: Secondary | ICD-10-CM

## 2023-11-04 DIAGNOSIS — J4530 Mild persistent asthma, uncomplicated: Secondary | ICD-10-CM | POA: Diagnosis not present

## 2023-11-04 DIAGNOSIS — J439 Emphysema, unspecified: Secondary | ICD-10-CM | POA: Diagnosis not present

## 2023-11-04 DIAGNOSIS — E7800 Pure hypercholesterolemia, unspecified: Secondary | ICD-10-CM | POA: Diagnosis not present

## 2023-11-04 DIAGNOSIS — E785 Hyperlipidemia, unspecified: Secondary | ICD-10-CM | POA: Diagnosis not present

## 2023-11-04 DIAGNOSIS — N200 Calculus of kidney: Secondary | ICD-10-CM | POA: Diagnosis not present

## 2023-11-04 DIAGNOSIS — N1832 Chronic kidney disease, stage 3b: Secondary | ICD-10-CM | POA: Diagnosis not present

## 2023-11-04 DIAGNOSIS — N189 Chronic kidney disease, unspecified: Secondary | ICD-10-CM | POA: Diagnosis not present

## 2023-11-04 DIAGNOSIS — J4520 Mild intermittent asthma, uncomplicated: Secondary | ICD-10-CM | POA: Diagnosis not present

## 2023-11-04 DIAGNOSIS — M8580 Other specified disorders of bone density and structure, unspecified site: Secondary | ICD-10-CM | POA: Diagnosis not present

## 2023-11-04 DIAGNOSIS — I10 Essential (primary) hypertension: Secondary | ICD-10-CM | POA: Diagnosis not present

## 2023-11-25 DIAGNOSIS — J4520 Mild intermittent asthma, uncomplicated: Secondary | ICD-10-CM | POA: Diagnosis not present

## 2023-11-25 DIAGNOSIS — Z8673 Personal history of transient ischemic attack (TIA), and cerebral infarction without residual deficits: Secondary | ICD-10-CM | POA: Diagnosis not present

## 2023-11-25 DIAGNOSIS — G629 Polyneuropathy, unspecified: Secondary | ICD-10-CM | POA: Diagnosis not present

## 2023-11-25 DIAGNOSIS — S91105D Unspecified open wound of left lesser toe(s) without damage to nail, subsequent encounter: Secondary | ICD-10-CM | POA: Diagnosis not present

## 2023-11-25 DIAGNOSIS — K219 Gastro-esophageal reflux disease without esophagitis: Secondary | ICD-10-CM | POA: Diagnosis not present

## 2023-11-25 DIAGNOSIS — J439 Emphysema, unspecified: Secondary | ICD-10-CM | POA: Diagnosis not present

## 2023-11-25 DIAGNOSIS — E559 Vitamin D deficiency, unspecified: Secondary | ICD-10-CM | POA: Diagnosis not present

## 2023-11-25 DIAGNOSIS — E785 Hyperlipidemia, unspecified: Secondary | ICD-10-CM | POA: Diagnosis not present

## 2023-11-25 DIAGNOSIS — Z902 Acquired absence of lung [part of]: Secondary | ICD-10-CM | POA: Diagnosis not present

## 2023-11-25 DIAGNOSIS — G3184 Mild cognitive impairment, so stated: Secondary | ICD-10-CM | POA: Diagnosis not present

## 2023-11-25 DIAGNOSIS — Z7982 Long term (current) use of aspirin: Secondary | ICD-10-CM | POA: Diagnosis not present

## 2023-11-25 DIAGNOSIS — I129 Hypertensive chronic kidney disease with stage 1 through stage 4 chronic kidney disease, or unspecified chronic kidney disease: Secondary | ICD-10-CM | POA: Diagnosis not present

## 2023-11-25 DIAGNOSIS — I2510 Atherosclerotic heart disease of native coronary artery without angina pectoris: Secondary | ICD-10-CM | POA: Diagnosis not present

## 2023-11-25 DIAGNOSIS — N1832 Chronic kidney disease, stage 3b: Secondary | ICD-10-CM | POA: Diagnosis not present

## 2023-11-25 DIAGNOSIS — Z9181 History of falling: Secondary | ICD-10-CM | POA: Diagnosis not present

## 2023-11-25 DIAGNOSIS — Z85118 Personal history of other malignant neoplasm of bronchus and lung: Secondary | ICD-10-CM | POA: Diagnosis not present

## 2023-12-02 DIAGNOSIS — G3184 Mild cognitive impairment, so stated: Secondary | ICD-10-CM | POA: Diagnosis not present

## 2023-12-02 DIAGNOSIS — N1832 Chronic kidney disease, stage 3b: Secondary | ICD-10-CM | POA: Diagnosis not present

## 2023-12-02 DIAGNOSIS — S91105D Unspecified open wound of left lesser toe(s) without damage to nail, subsequent encounter: Secondary | ICD-10-CM | POA: Diagnosis not present

## 2023-12-02 DIAGNOSIS — E559 Vitamin D deficiency, unspecified: Secondary | ICD-10-CM | POA: Diagnosis not present

## 2023-12-02 DIAGNOSIS — G629 Polyneuropathy, unspecified: Secondary | ICD-10-CM | POA: Diagnosis not present

## 2023-12-02 DIAGNOSIS — I2510 Atherosclerotic heart disease of native coronary artery without angina pectoris: Secondary | ICD-10-CM | POA: Diagnosis not present

## 2023-12-02 DIAGNOSIS — K219 Gastro-esophageal reflux disease without esophagitis: Secondary | ICD-10-CM | POA: Diagnosis not present

## 2023-12-02 DIAGNOSIS — J4520 Mild intermittent asthma, uncomplicated: Secondary | ICD-10-CM | POA: Diagnosis not present

## 2023-12-02 DIAGNOSIS — E785 Hyperlipidemia, unspecified: Secondary | ICD-10-CM | POA: Diagnosis not present

## 2023-12-02 DIAGNOSIS — Z9181 History of falling: Secondary | ICD-10-CM | POA: Diagnosis not present

## 2023-12-02 DIAGNOSIS — I129 Hypertensive chronic kidney disease with stage 1 through stage 4 chronic kidney disease, or unspecified chronic kidney disease: Secondary | ICD-10-CM | POA: Diagnosis not present

## 2023-12-02 DIAGNOSIS — Z902 Acquired absence of lung [part of]: Secondary | ICD-10-CM | POA: Diagnosis not present

## 2023-12-02 DIAGNOSIS — J439 Emphysema, unspecified: Secondary | ICD-10-CM | POA: Diagnosis not present

## 2023-12-02 DIAGNOSIS — Z7982 Long term (current) use of aspirin: Secondary | ICD-10-CM | POA: Diagnosis not present

## 2023-12-02 DIAGNOSIS — Z8673 Personal history of transient ischemic attack (TIA), and cerebral infarction without residual deficits: Secondary | ICD-10-CM | POA: Diagnosis not present

## 2023-12-02 DIAGNOSIS — Z85118 Personal history of other malignant neoplasm of bronchus and lung: Secondary | ICD-10-CM | POA: Diagnosis not present

## 2023-12-03 DIAGNOSIS — E559 Vitamin D deficiency, unspecified: Secondary | ICD-10-CM | POA: Diagnosis not present

## 2023-12-03 DIAGNOSIS — E785 Hyperlipidemia, unspecified: Secondary | ICD-10-CM | POA: Diagnosis not present

## 2023-12-03 DIAGNOSIS — I10 Essential (primary) hypertension: Secondary | ICD-10-CM | POA: Diagnosis not present

## 2023-12-04 DIAGNOSIS — E785 Hyperlipidemia, unspecified: Secondary | ICD-10-CM | POA: Diagnosis not present

## 2023-12-04 DIAGNOSIS — E559 Vitamin D deficiency, unspecified: Secondary | ICD-10-CM | POA: Diagnosis not present

## 2023-12-04 DIAGNOSIS — Z8673 Personal history of transient ischemic attack (TIA), and cerebral infarction without residual deficits: Secondary | ICD-10-CM | POA: Diagnosis not present

## 2023-12-04 DIAGNOSIS — G3184 Mild cognitive impairment, so stated: Secondary | ICD-10-CM | POA: Diagnosis not present

## 2023-12-04 DIAGNOSIS — Z85118 Personal history of other malignant neoplasm of bronchus and lung: Secondary | ICD-10-CM | POA: Diagnosis not present

## 2023-12-04 DIAGNOSIS — J439 Emphysema, unspecified: Secondary | ICD-10-CM | POA: Diagnosis not present

## 2023-12-04 DIAGNOSIS — G629 Polyneuropathy, unspecified: Secondary | ICD-10-CM | POA: Diagnosis not present

## 2023-12-04 DIAGNOSIS — Z902 Acquired absence of lung [part of]: Secondary | ICD-10-CM | POA: Diagnosis not present

## 2023-12-04 DIAGNOSIS — K219 Gastro-esophageal reflux disease without esophagitis: Secondary | ICD-10-CM | POA: Diagnosis not present

## 2023-12-04 DIAGNOSIS — S91105D Unspecified open wound of left lesser toe(s) without damage to nail, subsequent encounter: Secondary | ICD-10-CM | POA: Diagnosis not present

## 2023-12-04 DIAGNOSIS — Z9181 History of falling: Secondary | ICD-10-CM | POA: Diagnosis not present

## 2023-12-04 DIAGNOSIS — Z7982 Long term (current) use of aspirin: Secondary | ICD-10-CM | POA: Diagnosis not present

## 2023-12-04 DIAGNOSIS — I2510 Atherosclerotic heart disease of native coronary artery without angina pectoris: Secondary | ICD-10-CM | POA: Diagnosis not present

## 2023-12-04 DIAGNOSIS — J4520 Mild intermittent asthma, uncomplicated: Secondary | ICD-10-CM | POA: Diagnosis not present

## 2023-12-04 DIAGNOSIS — N1832 Chronic kidney disease, stage 3b: Secondary | ICD-10-CM | POA: Diagnosis not present

## 2023-12-04 DIAGNOSIS — I129 Hypertensive chronic kidney disease with stage 1 through stage 4 chronic kidney disease, or unspecified chronic kidney disease: Secondary | ICD-10-CM | POA: Diagnosis not present

## 2023-12-06 DIAGNOSIS — J4520 Mild intermittent asthma, uncomplicated: Secondary | ICD-10-CM | POA: Diagnosis not present

## 2023-12-06 DIAGNOSIS — Z8673 Personal history of transient ischemic attack (TIA), and cerebral infarction without residual deficits: Secondary | ICD-10-CM | POA: Diagnosis not present

## 2023-12-06 DIAGNOSIS — S91105D Unspecified open wound of left lesser toe(s) without damage to nail, subsequent encounter: Secondary | ICD-10-CM | POA: Diagnosis not present

## 2023-12-06 DIAGNOSIS — G629 Polyneuropathy, unspecified: Secondary | ICD-10-CM | POA: Diagnosis not present

## 2023-12-06 DIAGNOSIS — J439 Emphysema, unspecified: Secondary | ICD-10-CM | POA: Diagnosis not present

## 2023-12-06 DIAGNOSIS — Z9181 History of falling: Secondary | ICD-10-CM | POA: Diagnosis not present

## 2023-12-06 DIAGNOSIS — Z902 Acquired absence of lung [part of]: Secondary | ICD-10-CM | POA: Diagnosis not present

## 2023-12-06 DIAGNOSIS — G3184 Mild cognitive impairment, so stated: Secondary | ICD-10-CM | POA: Diagnosis not present

## 2023-12-06 DIAGNOSIS — I2510 Atherosclerotic heart disease of native coronary artery without angina pectoris: Secondary | ICD-10-CM | POA: Diagnosis not present

## 2023-12-06 DIAGNOSIS — I129 Hypertensive chronic kidney disease with stage 1 through stage 4 chronic kidney disease, or unspecified chronic kidney disease: Secondary | ICD-10-CM | POA: Diagnosis not present

## 2023-12-06 DIAGNOSIS — Z7982 Long term (current) use of aspirin: Secondary | ICD-10-CM | POA: Diagnosis not present

## 2023-12-06 DIAGNOSIS — Z85118 Personal history of other malignant neoplasm of bronchus and lung: Secondary | ICD-10-CM | POA: Diagnosis not present

## 2023-12-06 DIAGNOSIS — E559 Vitamin D deficiency, unspecified: Secondary | ICD-10-CM | POA: Diagnosis not present

## 2023-12-06 DIAGNOSIS — K219 Gastro-esophageal reflux disease without esophagitis: Secondary | ICD-10-CM | POA: Diagnosis not present

## 2023-12-06 DIAGNOSIS — N1832 Chronic kidney disease, stage 3b: Secondary | ICD-10-CM | POA: Diagnosis not present

## 2023-12-06 DIAGNOSIS — E785 Hyperlipidemia, unspecified: Secondary | ICD-10-CM | POA: Diagnosis not present

## 2024-03-22 ENCOUNTER — Other Ambulatory Visit: Payer: Self-pay | Admitting: Thoracic Surgery (Cardiothoracic Vascular Surgery)

## 2024-03-22 DIAGNOSIS — R918 Other nonspecific abnormal finding of lung field: Secondary | ICD-10-CM

## 2024-05-02 ENCOUNTER — Ambulatory Visit: Admitting: Thoracic Surgery (Cardiothoracic Vascular Surgery)
